# Patient Record
Sex: Female | Born: 1950
Health system: Southern US, Community
[De-identification: ages and names within clinical notes are randomized; demographics above are authoritative.]

## PROBLEM LIST (undated history)

## (undated) DIAGNOSIS — R7989 Other specified abnormal findings of blood chemistry: Secondary | ICD-10-CM

## (undated) DIAGNOSIS — F4310 Post-traumatic stress disorder, unspecified: Secondary | ICD-10-CM

## (undated) DIAGNOSIS — E039 Hypothyroidism, unspecified: Secondary | ICD-10-CM

## (undated) DIAGNOSIS — K219 Gastro-esophageal reflux disease without esophagitis: Secondary | ICD-10-CM

## (undated) DIAGNOSIS — F419 Anxiety disorder, unspecified: Secondary | ICD-10-CM

## (undated) DIAGNOSIS — G9340 Encephalopathy, unspecified: Secondary | ICD-10-CM

## (undated) DIAGNOSIS — I10 Essential (primary) hypertension: Secondary | ICD-10-CM

## (undated) HISTORY — DX: Post-traumatic stress disorder, unspecified: F43.10

## (undated) HISTORY — DX: Hypothyroidism, unspecified: E03.9

## (undated) HISTORY — DX: Anxiety disorder, unspecified: F41.9

## (undated) HISTORY — DX: Gastro-esophageal reflux disease without esophagitis: K21.9

## (undated) HISTORY — DX: Other specified abnormal findings of blood chemistry: R79.89

## (undated) HISTORY — DX: Essential (primary) hypertension: I10

---

## 2011-08-04 ENCOUNTER — Ambulatory Visit (HOSPITAL_COMMUNITY): Payer: Self-pay | Admitting: Licensed Clinical Social Worker

## 2011-09-06 ENCOUNTER — Ambulatory Visit (INDEPENDENT_AMBULATORY_CARE_PROVIDER_SITE_OTHER): Payer: Medicare Other | Admitting: Licensed Clinical Social Worker

## 2011-09-06 DIAGNOSIS — F411 Generalized anxiety disorder: Secondary | ICD-10-CM

## 2011-09-21 ENCOUNTER — Encounter (HOSPITAL_COMMUNITY): Payer: Medicare Other | Admitting: Licensed Clinical Social Worker

## 2011-11-10 ENCOUNTER — Ambulatory Visit (INDEPENDENT_AMBULATORY_CARE_PROVIDER_SITE_OTHER): Payer: Medicare Other | Admitting: Licensed Clinical Social Worker

## 2011-11-10 ENCOUNTER — Encounter (HOSPITAL_COMMUNITY): Payer: Self-pay | Admitting: Licensed Clinical Social Worker

## 2011-11-10 DIAGNOSIS — F410 Panic disorder [episodic paroxysmal anxiety] without agoraphobia: Secondary | ICD-10-CM | POA: Insufficient documentation

## 2011-11-10 DIAGNOSIS — F4310 Post-traumatic stress disorder, unspecified: Secondary | ICD-10-CM
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
## 2011-11-10 NOTE — Patient Instructions (Signed)
Keep negative comments about son-in-law to herself Continue to try to identify triggers for anxiety attacks

## 2011-12-07 ENCOUNTER — Ambulatory Visit (INDEPENDENT_AMBULATORY_CARE_PROVIDER_SITE_OTHER): Payer: Medicare Other | Admitting: Licensed Clinical Social Worker

## 2011-12-07 ENCOUNTER — Encounter (HOSPITAL_COMMUNITY): Payer: Self-pay | Admitting: Licensed Clinical Social Worker

## 2011-12-07 DIAGNOSIS — F410 Panic disorder [episodic paroxysmal anxiety] without agoraphobia: Secondary | ICD-10-CM
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
## 2011-12-07 NOTE — Progress Notes (Deleted)
Psychiatric Assessment Adult  Patient Identification:  Claire [name] Date of Evaluation:  12/07/2011 Chief Complaint: *** History of Chief Complaint:  No chief complaint on file.   HPI Review of Systems Physical Exam  Depressive Symptoms: {DEPRESSION SYMPTOMS:20000}  (Hypo) Manic Symptoms:   Elevated Mood:  {BHH YES OR NO:22294} Irritable Mood:  {BHH YES OR NO:22294} Grandiosity:  {BHH YES OR NO:22294} Distractibility:  {BHH YES OR NO:22294} Labiality of Mood:  {BHH YES OR NO:22294} Delusions:  {BHH YES OR NO:22294} Hallucinations:  {BHH YES OR NO:22294} Impulsivity:  {BHH YES OR NO:22294} Sexually Inappropriate Behavior:  {BHH YES OR NO:22294} Financial Extravagance:  {BHH YES OR NO:22294} Flight of Ideas:  {BHH YES OR NO:22294}  Anxiety Symptoms: Excessive Worry:  {BHH YES OR NO:22294} Panic Symptoms:  {BHH YES OR NO:22294} Agoraphobia:  {BHH YES OR NO:22294} Obsessive Compulsive: {BHH YES OR NO:22294}  Symptoms: {Obsessive Compulsive Symptoms:22671} Specific Phobias:  {BHH YES OR NO:22294} Social Anxiety:  {BHH YES OR NO:22294}  Psychotic Symptoms:  Hallucinations: {BHH YES OR NO:22294} {Hallucinations:22672} Delusions:  {BHH YES OR NO:22294} Paranoia:  {BHH YES OR NO:22294}   Ideas of Reference:  {BHH YES OR NO:22294}  PTSD Symptoms: Ever had a traumatic exposure:  {BHH YES OR NO:22294} Had a traumatic exposure in the last month:  {BHH YES OR NO:22294} Re-experiencing: {BHH YES OR NO:22294} {Re-experiencing:22673} Hypervigilance:  {BHH YES OR NO:22294} Hyperarousal: {BHH YES OR NO:22294} {Hyperarousal:22674} Avoidance: {BHH YES OR NO:22294} {Avoidance:22675}  Traumatic Brain Injury: {BHH YES OR NO:22294} {Traumatic Brain Injury:22676}  Past Psychiatric History: Diagnosis: ***  Hospitalizations: ***  Outpatient Care: ***  Substance Abuse Care: ***  Self-Mutilation: ***  Suicidal Attempts: ***  Violent Behaviors: ***   Past Medical History:   Past  Medical History  Diagnosis Date  . High blood pressure   . PTSD (post-traumatic stress disorder)   . Acid reflux    History of Loss of Consciousness:  {BHH YES OR NO:22294} Seizure History:  {BHH YES OR NO:22294} Cardiac History:  {BHH YES OR NO:22294} Allergies:   Allergies  Allergen Reactions  . Macrodantin (Nitrofurantoin Macrocrystal) Rash   Current Medications:  Current Outpatient Prescriptions  Medication Sig Dispense Refill  . atenolol (TENORMIN) 50 MG tablet Take 50 mg by mouth daily.        . clonazePAM (KLONOPIN) 0.5 MG tablet Take 0.5 mg by mouth 4 (four) times daily - after meals and at bedtime.          Previous Psychotropic Medications:  Medication Dose   ***  ***                     Substance Abuse History in the last 12 months: Substance Age of 1st Use Last Use Amount Specific Type  Nicotine  ***  ***  ***  ***  Alcohol  ***  ***  ***  ***  Cannabis  ***  ***  ***  ***  Opiates  ***  ***  ***  ***  Cocaine  ***  ***  ***  ***  Methamphetamines  ***  ***  ***  ***  LSD  ***  ***  ***  ***  Ecstasy  ***   ***  ***  ***  Benzodiazepines  ***  ***  ***  ***  Caffeine  ***  ***  ***  ***  Inhalants  ***  ***  ***  ***  Others:  Medical Consequences of Substance Abuse: ***  Legal Consequences of Substance Abuse: ***  Family Consequences of Substance Abuse: ***  Blackouts:  {BHH YES OR NO:22294} DT's:  {BHH YES OR NO:22294} Withdrawal Symptoms:  {BHH YES OR NO:22294} {Withdrawal Symptoms:22677}  Social History: Current Place of Residence: *** Place of Birth: *** Family Members: *** Marital Status:  {Marital Status:22678} Children: ***  Sons: ***  Daughters: *** Relationships: *** Education:  {Education:22679} Educational Problems/Performance: *** Religious Beliefs/Practices: *** History of Abuse: {Desc; abuse:16542} Occupational Experiences; Military History:  {Military History:22680} Legal History:  *** Hobbies/Interests: ***  Family History:   Family History  Problem Relation Age of Onset  . Anxiety disorder Mother   . OCD Daughter   . Alcohol abuse Father     Mental Status Examination/Evaluation: Objective:  Appearance: {Appearance:22683}  Eye Contact::  {BHH EYE CONTACT:22684}  Speech:  {Speech:22685}  Volume:  {Volume (PAA):22686}  Mood:  ***  Affect:  {Affect (PAA):22687}  Thought Process:  {Thought Process (PAA):22688}  Orientation:  {BHH ORIENTATION (PAA):22689}  Thought Content:  {Thought Content:22690}  Suicidal Thoughts:  {ST/HT (PAA):22692}  Homicidal Thoughts:  {ST/HT (PAA):22692}  Judgement:  {Judgement (PAA):22694}  Insight:  {Insight (PAA):22695}  Psychomotor Activity:  {Psychomotor (PAA):22696}  Akathisia:  {BHH YES OR NO:22294}  Handed:  {Handed:22697}  AIMS (if indicated):  ***  Assets:  {Assets (PAA):22698}    Laboratory/X-Ray Psychological Evaluation(s)   ***  ***   Assessment:  {axis diagnosis:3049000}  AXIS I {psych axis 1:31909}  AXIS II {psych axis 2:31910}  AXIS III Past Medical History  Diagnosis Date  . High blood pressure   . PTSD (post-traumatic stress disorder)   . Acid reflux      AXIS IV {psych axis iv:31915}  AXIS V {psych axis v score:31919}   Treatment Plan/Recommendations:  Plan of Care: ***  Laboratory:  {Laboratory:22682}  Psychotherapy: ***  Medications: ***  Routine PRN Medications:  {BHH YES OR NO:22294}  Consultations: ***  Safety Concerns:  ***  Other:      Arna Luis,JUDITH A, LCSW 1/2/20135:27 PM

## 2011-12-08 ENCOUNTER — Encounter (HOSPITAL_COMMUNITY): Payer: Self-pay | Admitting: Licensed Clinical Social Worker

## 2011-12-08 NOTE — Progress Notes (Signed)
   THERAPIST PROGRESS NOTE  Session Time: 2:05 - 3:00  Participation Level: Active  Behavioral Response: Fairly GroomedAlertDepressed  Type of Therapy: Individual Therapy  Treatment Goals addressed: Anxiety and Communication: in relation to son-in-law  Interventions: Solution Focused, Supportive, Family Systems and Reframing  Summary: Claire [name] is a 61 y.o. female who presents with a depressed mood.  Mainly  Concerned about her living situation - she is with her daughter and [name] does not like her son-in-law - the feeling seems to be mutual.  She takes care of her grandchild to help her daughter while they are helping her by her staying there.  She cannot be alone because of her panic attacks. This has been a problem since she left her husband.  She changed therapists because going to [facility] was too difficult on transportation.  .   Suicidal/Homicidal: Nowithout intent/plan  Plan: Return again in 3 weeks.  Diagnosis: Axis I: Panic Disorder    Axis II: Deferred    Teddrick Mallari,JUDITH A, LCSW 12/08/2011

## 2012-01-10 ENCOUNTER — Ambulatory Visit (HOSPITAL_COMMUNITY): Payer: Self-pay | Admitting: Licensed Clinical Social Worker

## 2012-01-24 ENCOUNTER — Encounter (HOSPITAL_COMMUNITY): Payer: Self-pay | Admitting: Licensed Clinical Social Worker

## 2012-01-24 ENCOUNTER — Ambulatory Visit (INDEPENDENT_AMBULATORY_CARE_PROVIDER_SITE_OTHER): Payer: Medicare Other | Admitting: Licensed Clinical Social Worker

## 2012-01-24 DIAGNOSIS — F4310 Post-traumatic stress disorder, unspecified: Secondary | ICD-10-CM

## 2012-01-24 DIAGNOSIS — F410 Panic disorder [episodic paroxysmal anxiety] without agoraphobia: Secondary | ICD-10-CM
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
## 2012-02-07 ENCOUNTER — Ambulatory Visit (HOSPITAL_COMMUNITY): Payer: Self-pay | Admitting: Licensed Clinical Social Worker

## 2012-02-16 ENCOUNTER — Ambulatory Visit (INDEPENDENT_AMBULATORY_CARE_PROVIDER_SITE_OTHER): Payer: Medicare Other | Admitting: Licensed Clinical Social Worker

## 2012-02-16 DIAGNOSIS — F411 Generalized anxiety disorder: Secondary | ICD-10-CM

## 2012-02-16 DIAGNOSIS — F419 Anxiety disorder, unspecified: Secondary | ICD-10-CM

## 2012-02-16 NOTE — Progress Notes (Signed)
   THERAPIST PROGRESS NOTE  Session Time: 3:10 - 4:05  Participation Level: Active  Behavioral Response: CasualAlertEuthymic  Type of Therapy: Individual Therapy  Treatment Goals addressed: Anxiety  Interventions: Motivational Interviewing, Solution Focused and Supportive  Summary: Claire [name] is a 61 y.o. female who presents with problems with anxiety. 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  She really liked that job. She discussed never liking to visit [place] and [place] country because she was afraid of the dark. She was made to go.  They would not put on a night light for her.  [place] a city and was used to lights.  No one would ever let her have a light.  Her father was alcoholic and he liked to date very young girls  . She has had two sister die - Aggie Cosier at age 27 and Angie at age 36 - she had an infection from a hip replacement.  Angie was her favorite sister.  She rarely has seen her mother - she comes up [place] with she needs something.  Suicidal/Homicidal: Nowithout intent/plan  Plan: Return again in 2 weeks.  Diagnosis: Axis I: Anxiety Disorder NOS    Axis II: Deferred    Beena Catano,JUDITH A, LCSW 02/16/2012

## 2012-03-08 ENCOUNTER — Ambulatory Visit (HOSPITAL_COMMUNITY): Payer: Self-pay | Admitting: Licensed Clinical Social Worker

## 2012-04-10 ENCOUNTER — Ambulatory Visit (INDEPENDENT_AMBULATORY_CARE_PROVIDER_SITE_OTHER): Payer: Medicare Other | Admitting: Licensed Clinical Social Worker

## 2012-04-10 DIAGNOSIS — F419 Anxiety disorder, unspecified: Secondary | ICD-10-CM

## 2012-04-10 DIAGNOSIS — F411 Generalized anxiety disorder: Secondary | ICD-10-CM
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
## 2012-06-22 ENCOUNTER — Ambulatory Visit (INDEPENDENT_AMBULATORY_CARE_PROVIDER_SITE_OTHER): Payer: Medicare Other | Admitting: Licensed Clinical Social Worker

## 2012-06-22 DIAGNOSIS — F4310 Post-traumatic stress disorder, unspecified: Secondary | ICD-10-CM

## 2012-06-22 DIAGNOSIS — F410 Panic disorder [episodic paroxysmal anxiety] without agoraphobia: Secondary | ICD-10-CM

## 2012-06-24 NOTE — Progress Notes (Signed)
   THERAPIST PROGRESS NOTE  Session Time: 3:00 - 3:50  Participation Level: Active  Behavioral Response: CasualAlertEuthymic  Type of Therapy: Individual Therapy  Treatment Goals addressed: Anger and Anxiety  Interventions: Motivational Interviewing, Solution Focused and Supportive  Summary: SELINDA KORZENIEWSKI is a 61 y.o. female who presents with problems with anxiety.  [name] reports that the divorce papers were served 5 or 6 weeks ago and there has been no response  She is frustrated because she gets nothing from him.  The house was an inheritance from his parents.  They have a tax bill for 9000 dollars which is really his debt but it was done under both of their names in 2010.  She is afraid they will end up coming after her - they do not have anything in writing which says that he is responsible for the debt.  She feels for all that she put up with him that he owes her something. She is having a hard time moving on.   Suicidal/Homicidal: Nowithout intent/plan  Plan: Return again in 2 weeks.  Diagnosis: Axis I: Anxiety Disorder NOS    Axis II: Deferred    Cambell Rickenbach,JUDITH A, LCSW 06/24/2012

## 2012-07-10 ENCOUNTER — Ambulatory Visit (HOSPITAL_COMMUNITY): Payer: Self-pay | Admitting: Licensed Clinical Social Worker

## 2012-07-13 ENCOUNTER — Ambulatory Visit (HOSPITAL_COMMUNITY): Payer: Self-pay | Admitting: Licensed Clinical Social Worker

## 2012-07-24 DIAGNOSIS — I10 Essential (primary) hypertension: Secondary | ICD-10-CM | POA: Insufficient documentation

## 2012-07-26 ENCOUNTER — Ambulatory Visit (HOSPITAL_COMMUNITY): Payer: Self-pay | Admitting: Licensed Clinical Social Worker

## 2012-07-31 ENCOUNTER — Ambulatory Visit (INDEPENDENT_AMBULATORY_CARE_PROVIDER_SITE_OTHER): Payer: Medicare Other | Admitting: Licensed Clinical Social Worker

## 2012-07-31 DIAGNOSIS — F410 Panic disorder [episodic paroxysmal anxiety] without agoraphobia: Secondary | ICD-10-CM

## 2012-07-31 NOTE — Progress Notes (Signed)
   THERAPIST PROGRESS NOTE  Session Time: 4:05 - 4:50  Participation Level: Active  Behavioral Response: CasualAlertEuthymic  Type of Therapy: Individual Therapy  Treatment Goals addressed: Anxiety  Interventions: Motivational Interviewing and Supportive  Summary: Claire [name] is a 61 y.o. female who presents with anxiety.  Claire [name] reported that she had to take cortisone because of inflammation in her shoulder and she was anxious about it.  It is a med pack and she has to take 6 for two days and then it goes down gradually.She had never taken that many pills before and was worried about having a bad reaction.  She has no evidence that she is allergic.  Did let her know that some people have problems with being a bit emotional.  She does not like taking medication anyway.  She would never be abusing drugs.  She is concerned about the fact that her husband has not signed divorce papers.  She would like him to settle with her financially but she fears that he won't.  She talked more about living with his hoarding.and how difficult that was. She also discussed her difficulty with her son-in -law. Claire [name] seems to have a tendency to hold negative feelings.  Suicidal/Homicidal: Nowithout intent/plan  Plan: Return again in 4 weeks.  Diagnosis: Axis I: Anxiety Disorder NOS    Axis II: Deferred    Zared Knoth,JUDITH A, LCSW 07/31/2012

## 2012-08-31 ENCOUNTER — Ambulatory Visit (HOSPITAL_COMMUNITY): Payer: Self-pay | Admitting: Licensed Clinical Social Worker

## 2012-09-12 ENCOUNTER — Ambulatory Visit (HOSPITAL_COMMUNITY): Payer: Self-pay | Admitting: Licensed Clinical Social Worker

## 2012-09-18 ENCOUNTER — Ambulatory Visit (INDEPENDENT_AMBULATORY_CARE_PROVIDER_SITE_OTHER): Payer: Medicare Other | Admitting: Licensed Clinical Social Worker

## 2012-09-18 DIAGNOSIS — F410 Panic disorder [episodic paroxysmal anxiety] without agoraphobia: Secondary | ICD-10-CM

## 2012-09-18 NOTE — Progress Notes (Signed)
   THERAPIST PROGRESS NOTE  Session Time: 2:10 - 3:05  Participation Level: Active  Behavioral Response: CasualAlertEuthymic  Type of Therapy: Individual Therapy  Treatment Goals addressed: Anxiety  Interventions: Motivational Interviewing, Solution Focused and Supportive  Summary: Claire [name] is a 61 y.o. female who presents with anxiety.Claire [name] reports that she is staying with her mother for awhile.  She and her daughter needed a break from each other. She talked a lot about how her son and daughter are so different.  Her daughter has always been a challenge and in conflict with her a lot.  Her son does not like conflict and is good to her.  Her daughter feels that she favors her brother over her.  Where Claire [name] feels it is just that they have different personalities and her son is easier to get along with. She talked of how she did not like either spouse of her children - even saying that she hated them.  As she gets into some detail about her relationship with her children, it becomes clearer that there are no boundaries in those relationships.  She is also more focussed on their lives than her own. Her ex husband has not signed the papers for the divorce and she would like to get what she is to take out of the house on paper so that she can get her things when she is ready.  Right now she does not have anyplace to put her things.  She does not want to pay for the divorce so she is waiting for him to complete what he has to complete.   Discussed how she needed to focus on what she wanted for her life and she did realized she did not know what she wanted.  She felt it ws selfish to think about that.  Discussed how it was healthy to be selfish in that way.  She needed to look at their things that she ws unhappy about and what belonged to her and what belonged to her children. She may not like her in-laws but they were not her marital reallocations and her children had to learn and be in their own path.   Seh needed to focus on her path.  She is to write down areas that were not for her to be concerned about and what areas she need to develop in herself.   Asked her what she seemed to get out of coming once per month - for perhaps she could just call when she felt the need to come in.  She felt that having this time was helpful for her in that she could talk about things that were bothering her and she knew it was confidential and it helped her get perspective on her problems.  Concerned that she was not getting enough follow up to make some changes.  She did agree to do some homework in between sessions so that she might get more out of the time.  Her homework was to identify areas that she was worried and concerned about that were really not her problem - define more clearly what she wanted in her life.  Suicidal/Homicidal: Nowithout intent/plan  Plan: Return again in 4 weeks.  Diagnosis: Axis I: Panic Disorder    Axis II: Deferred    Claire [name],JUDITH A, LCSW 09/18/2012

## 2012-10-17 ENCOUNTER — Ambulatory Visit (HOSPITAL_COMMUNITY): Payer: Self-pay | Admitting: Licensed Clinical Social Worker

## 2012-10-23 ENCOUNTER — Telehealth (HOSPITAL_COMMUNITY): Payer: Self-pay

## 2012-10-24 ENCOUNTER — Telehealth (HOSPITAL_COMMUNITY): Payer: Self-pay | Admitting: Licensed Clinical Social Worker

## 2012-10-24 ENCOUNTER — Encounter (HOSPITAL_COMMUNITY): Payer: Self-pay | Admitting: Licensed Clinical Social Worker

## 2012-11-14 ENCOUNTER — Ambulatory Visit (INDEPENDENT_AMBULATORY_CARE_PROVIDER_SITE_OTHER): Payer: Medicare Other | Admitting: Licensed Clinical Social Worker

## 2012-11-14 DIAGNOSIS — F410 Panic disorder [episodic paroxysmal anxiety] without agoraphobia: Secondary | ICD-10-CM
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
## 2012-12-04 ENCOUNTER — Encounter (HOSPITAL_COMMUNITY): Payer: Self-pay | Admitting: Licensed Clinical Social Worker

## 2012-12-27 ENCOUNTER — Ambulatory Visit (HOSPITAL_COMMUNITY): Payer: Self-pay | Admitting: Licensed Clinical Social Worker

## 2013-01-02 ENCOUNTER — Ambulatory Visit (HOSPITAL_COMMUNITY): Payer: Self-pay | Admitting: Licensed Clinical Social Worker

## 2013-01-03 ENCOUNTER — Ambulatory Visit (HOSPITAL_COMMUNITY): Payer: Self-pay | Admitting: Licensed Clinical Social Worker

## 2013-01-24 ENCOUNTER — Ambulatory Visit (HOSPITAL_COMMUNITY): Payer: Self-pay | Admitting: Licensed Clinical Social Worker

## 2013-01-29 ENCOUNTER — Ambulatory Visit (INDEPENDENT_AMBULATORY_CARE_PROVIDER_SITE_OTHER): Payer: Medicare Other | Admitting: Licensed Clinical Social Worker

## 2013-01-29 DIAGNOSIS — F410 Panic disorder [episodic paroxysmal anxiety] without agoraphobia: Secondary | ICD-10-CM

## 2013-02-03 NOTE — Progress Notes (Addendum)
   THERAPIST PROGRESS NOTE  Session Time: 1:00 - 2:00  Participation Level: Active  Behavioral Response: CasualAlertEuthymic  Type of Therapy: Individual Therapy  Treatment Goals addressed: Anxiety  Interventions: Motivational Interviewing and Supportive  Summary: Claire [name] is a 62 y.o. female who presents with concerns about  her son.  [name] has never liked [name]'s wife [name] or her mother Ginger - they both have lived with [name] since the beginning.  She has always been controlling and she has not felt she treated [name] very well.  She is concerned about how [name] seems to need someone telling him what to do.  He has not been in charge of himself in so long that he does not trust his own decision making.  She and [name] tend to get along well - he is easy going.  She feels angry that he was used for his money. [name] is upset with [name] in that she has quickly gotten stressed out having [name] living at the house. In fact she refers to her daughter as betraying her.  She would like [name] and her mother to move our together.  They are looking for an apartment.  It will be fine for them to live together.  [name] is good with her grandson - [name]'s son.  [name] does not handle money well and she did co - sign two loans for her - and has some regrets about that.  She states that she loves both of her children but she and her daughter are too much alike and push each others buttons whereby [name] lets things roll of of his back.  [name] rased his step son [name] who is now an adult.  He helped [name] get his clothes out and wondered why he had not left his mother a long time ago.  His mother in law is the control freak  He did not have transportation for a long time finally got him a truck and then controled its use.  He would be out of medication and food and he could not do anything about it.  His social security check went into his account and it was gone the next day.  [name] has changed his bank  account and will have check sent to him until all is settled down.     Suicidal/Homicidal: No  Therapist Response:  [name] could not help suggesting that I be more directive with [name].  My role will be to help him be more in touch with himself and be able to make own decisions  Plan: Return again in 4 weeks.  Diagnosis: Axis I: Anxiety Disorder NOS    Axis II: Deferred    BELL,JUDITH A, LCSW 02/03/2013

## 2013-02-19 ENCOUNTER — Ambulatory Visit (INDEPENDENT_AMBULATORY_CARE_PROVIDER_SITE_OTHER): Payer: Medicare Other | Admitting: Licensed Clinical Social Worker

## 2013-02-19 DIAGNOSIS — F410 Panic disorder [episodic paroxysmal anxiety] without agoraphobia: Secondary | ICD-10-CM

## 2013-02-20 NOTE — Progress Notes (Signed)
   THERAPIST PROGRESS NOTE  Session Time: 4:00 - 5:00  Participation Level: Active  Behavioral Response: CasualAlertEuthymic  Type of Therapy: Individual Therapy  Treatment Goals addressed: Anxiety  Interventions: Motivational Interviewing and Supportive  Summary: Claire [name] is a 62 y.o. female who presents with concerns about son and daughter.  [name] reports that she and her son [name] are moving out of daughter's and into a place of their own.  Daughter suggested that.but [name] feels that she may have changed her mind for she has been talking about them paying more - each giving 300 dollars and paying for food.  So she feels daughter will be angry when they leave.  She is unhappy about the way her daughter is treating her.  Her husband does the laundry because in a cold basement and daughter won't carry clothes up and down.  He is home at night so washes and dries clothes at night and dumps them on couch at about 10 or 11 o'clock.  Her daughter expects [name] to fold them then - she is not going to do that for that is bed time. [name] reports that she keeps her room clean, does dishes, cleans up kitchen, picks up the rest of the house and picks up her son and takes time with him for her. So she feels she contributes - she also pays some rent.  Her daughter is sharp tongued and reacts to her easily. [name] did talk about how she does not have a filter - when she thinks something she says it.  She does come across as very judgmental - [name] did not react negatively to being told that.  She is critical in her opinions and she says she is the first to say that she is opinionated.  [name] is showing signs of depression by sleeping too much and perhaps eating too much. He has not been coming in for regular appointments.  She wants to help him lose weight  He apparently gained 200 pounds while married and he quit doing the things he used to do.  He would be at the [employer] and go hunting and fishing.  He has lost  contact and does nothing.  [name] is angry with daughter because she feels she is giving [name] the same message he got in his marriage - "you are only good for your money and you eat too much"  [name] is going to have to get her furniture out of the house she used to live in with her husband.  She will have to take the [facility] with her.  She does want to work on developing a filter..  Suicidal/Homicidal: No  Therapist Response:  Along with a filer - she needs to look more positively at people and issues.  Plan: Return again in 2 weeks.  Diagnosis: Axis I: Anxiety Disorder NOS    Axis II: Deferred    Annice Jolly,JUDITH A, LCSW 02/20/2013

## 2013-03-20 ENCOUNTER — Telehealth (HOSPITAL_COMMUNITY): Payer: Self-pay

## 2013-04-03 NOTE — Telephone Encounter (Signed)
acknowledged

## 2013-04-10 ENCOUNTER — Ambulatory Visit (HOSPITAL_COMMUNITY): Payer: Self-pay | Admitting: Licensed Clinical Social Worker

## 2013-04-23 ENCOUNTER — Telehealth (HOSPITAL_COMMUNITY): Payer: Self-pay

## 2013-04-23 NOTE — Telephone Encounter (Signed)
WOULD LIKE TO ASK YOU A QUESTION WOULD NOT ELABORATE

## 2013-05-02 ENCOUNTER — Ambulatory Visit (INDEPENDENT_AMBULATORY_CARE_PROVIDER_SITE_OTHER): Payer: Medicare Other | Admitting: Licensed Clinical Social Worker

## 2013-05-02 DIAGNOSIS — F410 Panic disorder [episodic paroxysmal anxiety] without agoraphobia: Secondary | ICD-10-CM

## 2013-05-22 ENCOUNTER — Ambulatory Visit (HOSPITAL_COMMUNITY): Payer: Self-pay | Admitting: Licensed Clinical Social Worker

## 2013-05-31 NOTE — Progress Notes (Signed)
   THERAPIST PROGRESS NOTE  Session Time: 3:00 - 3:50  Participation Level: Active  Behavioral Response: CasualAlertEuthymic  Type of Therapy: Individual Therapy  Treatment Goals addressed: Anger and Anxiety  Interventions: Motivational Interviewing and Supportive  Summary: Claire [name] is [name] 62 y.o. female who presents with concerns about family.  She and her son have moved into an apartment and he is not being very helpful.  He is hardly ever there and he does minimal to be helpful.  He has taken an attitude with her about not running his life.  He does not want therapy, just complains about his weight but won't do anything about it.  She admits that she had overindulged him when he was [name] and it is definitely showing itself now.  He is pretty lazy and passive aggressive.  Discussed how she needs to set better boundaries for herself.  Her focus needs to come off of him and on to herself..   Suicidal/Homicidal: No  Therapist Response: Needs to do [name] lot of work on limits and boundaries.  Plan: Return again in 2 weeks.  Diagnosis: Axis I: Generalized Anxiety Disorder    Axis II: Deferred    Claire [name],Claire A, LCSW 05/31/2013

## 2014-11-24 DIAGNOSIS — E039 Hypothyroidism, unspecified: Secondary | ICD-10-CM | POA: Insufficient documentation

## 2014-11-26 ENCOUNTER — Emergency Department
Admission: EM | Admit: 2014-11-26 | Discharge: 2014-11-26 | Disposition: A | Discharge status: Home / Self Care | Payer: Medicare Other | Source: Home / Self Care | Attending: Emergency Medicine | Admitting: Emergency Medicine

## 2014-11-26 ENCOUNTER — Encounter: Payer: Self-pay | Admitting: Emergency Medicine

## 2014-11-26 DIAGNOSIS — R5383 Other fatigue: Secondary | ICD-10-CM

## 2014-11-26 DIAGNOSIS — E876 Hypokalemia: Secondary | ICD-10-CM

## 2014-11-26 NOTE — ED Notes (Signed)
Fatigue since Sunday, was treated in ED for diarrhea due to reaction from z-pak, low potassium (2.9) brought up to 3.8, in hospital, read on internet that high potassium can cause your heart to stop.

## 2014-11-27 ENCOUNTER — Telehealth: Payer: Self-pay | Admitting: Emergency Medicine

## 2014-11-27 LAB — COMPREHENSIVE METABOLIC PANEL
ALT: <8 U/L (ref 0–35)
AST: 15 U/L (ref 0–37)
Albumin: 3.9 g/dL (ref 3.5–5.2)
Alkaline Phosphatase: 73 U/L (ref 39–117)
BUN: 6 mg/dL (ref 6–23)
CO2: 35 mEq/L — ABNORMAL HIGH (ref 19–32)
Calcium: 8.6 mg/dL (ref 8.4–10.5)
Chloride: 96 mEq/L (ref 96–112)
Creat: 0.7 mg/dL (ref 0.50–1.10)
Glucose, Bld: 88 mg/dL (ref 70–99)
Potassium: 4.9 mEq/L (ref 3.5–5.3)
Sodium: 138 mEq/L (ref 135–145)
Total Bilirubin: 0.5 mg/dL (ref 0.2–1.2)
Total Protein: 7.4 g/dL (ref 6.0–8.3)

## 2014-11-28 NOTE — ED Provider Notes (Signed)
CSN: [id number]     Arrival date & time 11/26/14  1434 History   First MD Initiated Contact with Patient 11/26/14 1440     Chief Complaint  Patient presents with  . Fatigue   PCP is Dr Hewitt ShortsMatt Zimmerman, [place], [place]  The history is provided by the patient (and her adult daughter, who gives much of the history).  Three days ago, had acute acute diarrhea, fatigue ,generalized weakness without any focal weakness. [facility] , was found to be dehydrated, potassium 2.9, and she was treated with IV fluids including potassium replacement. She improved and was discharged home. Daughter states CBC was normal. Patient was recently started on levothyroxine 50 micrograms daily last week by her PCP for hypothyroid. She has a history of asthma/COPD, and continues to smoke a half a pack a day. Denies any new respiratory symptoms. Has stable dyspnea on exertion but no chest pain. Denies cough. Denies nausea, vomiting, fever, chills, diarrhea. Denies any focal neurologic symptoms. No syncope.  Patient and mother request we draw complete metabolic panel today.  Past Medical History  Diagnosis Date  . High blood pressure   . PTSD (post-traumatic stress disorder)   . Acid reflux   . Anxiety    History reviewed. No pertinent past surgical history. Family History  Problem Relation Age of Onset  . Anxiety disorder Mother   . OCD Daughter   . Alcohol abuse Father    History  Substance Use Topics  . Smoking status: Current Every Day Smoker -- 0.50 packs/day for 50 years    Types: Cigarettes  . Smokeless tobacco: Never Used  . Alcohol Use: No   OB History    No data available     Review of Systems  All other systems reviewed and are negative.   Allergies  Macrodantin and Zithromax  Home Medications   Prior to Admission medications   Medication Sig Start Date End Date Taking? Authorizing Provider  albuterol (PROVENTIL) (2.5 MG/3ML) 0.083% nebulizer  solution Take 2.5 mg by nebulization every 6 (six) hours as needed for wheezing or shortness of breath.   Yes Historical Provider, MD  Calcium Carbonate-Vitamin D (CALCIUM-VITAMIN D) 500-200 MG-UNIT per tablet Take 1 tablet by mouth daily.   Yes Historical Provider, MD  levothyroxine (SYNTHROID, LEVOTHROID) 50 MCG tablet Take 50 mcg by mouth daily before breakfast.   Yes Historical Provider, MD  mometasone-formoterol (DULERA) 100-5 MCG/ACT AERO Inhale 2 puffs into the lungs 2 (two) times daily.   Yes Historical Provider, MD  omeprazole (PRILOSEC) 20 MG capsule Take 20 mg by mouth daily.   Yes Historical Provider, MD  potassium chloride (K-DUR,KLOR-CON) 10 MEQ tablet Take 10 mEq by mouth 2 (two) times daily.   Yes Historical Provider, MD  atenolol (TENORMIN) 50 MG tablet Take 50 mg by mouth daily.      Historical Provider, MD  clonazePAM (KLONOPIN) 0.5 MG tablet Take 0.5 mg by mouth 4 (four) times daily - after meals and at bedtime.      Historical Provider, MD   BP 149/89 mmHg  Pulse 82  Temp(Src) 97.5 F (36.4 C) (Oral)  Ht 5\' 4"  (1.626 m)  Wt 139 lb (63.05 kg)  BMI 23.85 kg/m2  SpO2 93% Physical Exam  Constitutional: She is oriented to person, place, and time. She appears well-developed and well-nourished. No distress.  HENT:  Head: Normocephalic and atraumatic.  Eyes: Conjunctivae and EOM are normal. Pupils are equal, round, and  reactive to light. No scleral icterus.  Neck: Normal range of motion.  Cardiovascular: Normal rate.   Pulmonary/Chest: Effort normal.  Oxygen saturation repeated 95% room air  Abdominal: She exhibits no distension.  Musculoskeletal: Normal range of motion.  Neurological: She is alert and oriented to person, place, and time.  Skin: Skin is warm. No rash noted. She is not diaphoretic.  Psychiatric: She has a normal mood and affect.  Nursing note and vitals reviewed.   ED Course  Procedures (including critical care time) Labs Review   MDM   1. Other  fatigue   2. Hypokalemia   hypothyroid, new diagnosis by PCP. Chronic COPD, stable History of acute hypokalemia from diarrhea, but the diarrhea is resolved now.  Over 30 minutes spent, greater than 50% of the time spent for counseling and coordination of care. Treatment options discussed, as well as risks, benefits, alternatives. Patient and daughter voiced understanding and agreement with the following plans: Continue meds as prescribed by PCP. Draw CMP today.--- We will call back with results. Follow-up with your primary care doctor in 5-7 days if not improving, or sooner if symptoms become worse. Questions invited and answered.--they had some specific questions about hypothyroid treatment, and I answered as best I could, and I explained the need to take Levothyroxine as prescribed by PCP and follow-up with PCP periodically for monitoring lab work. Other questions invited and answered. Precautions discussed. Red flags discussed.  Patient and daughter voiced understanding and agreement.       Lajean Manesavid Massey, MD 11/28/14 1440

## 2015-03-12 ENCOUNTER — Encounter (HOSPITAL_COMMUNITY): Payer: Self-pay | Admitting: Psychiatry

## 2015-03-12 ENCOUNTER — Ambulatory Visit (INDEPENDENT_AMBULATORY_CARE_PROVIDER_SITE_OTHER): Payer: 59 | Admitting: Psychiatry

## 2015-03-12 VITALS — BP 137/77 | HR 80 | Ht 65.0 in | Wt 140.0 lb

## 2015-03-12 DIAGNOSIS — E039 Hypothyroidism, unspecified: Secondary | ICD-10-CM | POA: Insufficient documentation

## 2015-03-12 DIAGNOSIS — F410 Panic disorder [episodic paroxysmal anxiety] without agoraphobia: Secondary | ICD-10-CM | POA: Diagnosis not present

## 2015-03-12 MED ORDER — CLONAZEPAM 0.5 MG PO TABS: 0.5000 mg | ORAL_TABLET | Freq: Two times a day (BID) | ORAL | Status: DC | PRN
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
## 2015-03-13 ENCOUNTER — Telehealth (HOSPITAL_COMMUNITY): Payer: Self-pay

## 2015-03-13 NOTE — Telephone Encounter (Signed)
Return call to pt. Pt states she only to 2 tablets yesterday and went numb in her face, legs, and arms.  Per Dr. Gilmore LarocheAkhtar, please inform pt to take 2 tablets per night, pt may increase to 2 and 1/2 tablets a day.  Pt states she is still having panic attacks when she takes 2 tablets.  Informed pt, if the panic attacks are getting worse pt should proceed to the emergency room or can call the office to schedule an appt. Pt states and shows understanding.

## 2015-03-13 NOTE — Telephone Encounter (Signed)
PT would like to speak to you in regards to her lower dosage of Klonopin. She is experiencing numbness in her face,arms and legs, when she takes the lower dosage.

## 2015-03-26 ENCOUNTER — Telehealth (HOSPITAL_COMMUNITY): Payer: Self-pay | Admitting: *Deleted

## 2015-03-26 NOTE — Telephone Encounter (Signed)
Patient should be taking 2.5 but wanted to increase back to 3 tablets. She should have enough till 4/26th.

## 2015-03-26 NOTE — Telephone Encounter (Signed)
PT called for a refill for clonazePAM (KLONOPIN) 0.5 MG tablet.  Pt states she has been taking 3 pills a day and only has one week left for medication. Informed pt it is too early for medication refill. Rx was written on 03/12/15. Please cal pt to advise.

## 2015-03-31 ENCOUNTER — Telehealth (HOSPITAL_COMMUNITY): Payer: Self-pay | Admitting: *Deleted

## 2015-03-31 MED ORDER — CLONAZEPAM 0.5 MG PO TABS: 0.5000 mg | ORAL_TABLET | Freq: Two times a day (BID) | ORAL | Status: DC | PRN

## 2015-03-31 NOTE — Telephone Encounter (Signed)
Pt called for a refill for Klonopin 0.5mg . Pt called on 4/8 for advice for medication management.  Per Dr. Gilmore LarocheAkhtar, pt may increase Klonopin 0.5mg  to TID prn.  Per Dr. Gilmore LarocheAkhtar, pt is authorized for a refill for Klonopin 0.5mg  for a 10 day supply, Qty 30. Prescription was phoned into pharmacy on 4/26. Pharmacy will notify pt of prescription status. Pt has a f/u appt on 5/5.

## 2015-04-09 ENCOUNTER — Encounter (HOSPITAL_COMMUNITY): Payer: Self-pay | Admitting: Psychiatry

## 2015-04-09 ENCOUNTER — Ambulatory Visit (INDEPENDENT_AMBULATORY_CARE_PROVIDER_SITE_OTHER): Payer: 59 | Admitting: Psychiatry

## 2015-04-09 VITALS — Ht 65.0 in | Wt 138.0 lb

## 2015-04-09 DIAGNOSIS — F410 Panic disorder [episodic paroxysmal anxiety] without agoraphobia: Secondary | ICD-10-CM | POA: Diagnosis not present

## 2015-04-09 DIAGNOSIS — F4323 Adjustment disorder with mixed anxiety and depressed mood: Secondary | ICD-10-CM

## 2015-04-09 MED ORDER — CLONAZEPAM 0.5 MG PO TABS: 0.5000 mg | ORAL_TABLET | Freq: Three times a day (TID) | ORAL | Status: DC | PRN
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
## 2015-04-13 ENCOUNTER — Encounter: Payer: Self-pay | Admitting: *Deleted

## 2015-04-13 ENCOUNTER — Emergency Department
Admission: EM | Admit: 2015-04-13 | Discharge: 2015-04-13 | Disposition: A | Discharge status: Home / Self Care | Payer: Medicare Other | Source: Home / Self Care | Attending: Family Medicine | Admitting: Family Medicine

## 2015-04-13 DIAGNOSIS — R002 Palpitations: Secondary | ICD-10-CM | POA: Diagnosis not present

## 2015-04-13 NOTE — ED Notes (Signed)
Pt reports h/o IBS and diagnosed by GI with possible malabsorption. In the past when she has felt palpitations or "nervous feeling" she has gone to the ER with her potassium and magnesium levels out of range. She also has h/o panic attacks. The past 2 days she has felt similar sensations as she did when her K and Mg were abnormal.

## 2015-04-14 LAB — COMPLETE METABOLIC PANEL WITH GFR
ALK PHOS: 60 U/L (ref 39–117)
ALT: 9 U/L (ref 0–35)
AST: 17 U/L (ref 0–37)
Albumin: 4.2 g/dL (ref 3.5–5.2)
BUN: 20 mg/dL (ref 6–23)
CO2: 28 mEq/L (ref 19–32)
Calcium: 9.9 mg/dL (ref 8.4–10.5)
Chloride: 88 mEq/L — ABNORMAL LOW (ref 96–112)
Creat: 1.29 mg/dL — ABNORMAL HIGH (ref 0.50–1.10)
GFR, EST NON AFRICAN AMERICAN: 44 mL/min — AB
GFR, Est African American: 51 mL/min — ABNORMAL LOW
GLUCOSE: 97 mg/dL (ref 70–99)
Potassium: 5 mEq/L (ref 3.5–5.3)
Sodium: 127 mEq/L — ABNORMAL LOW (ref 135–145)
Total Bilirubin: 0.5 mg/dL (ref 0.2–1.2)
Total Protein: 8.1 g/dL (ref 6.0–8.3)

## 2015-04-14 LAB — MAGNESIUM: MAGNESIUM: 1.9 mg/dL (ref 1.5–2.5)

## 2015-04-21 NOTE — Discharge Instructions (Signed)

## 2015-04-21 NOTE — ED Provider Notes (Signed)
CSN: [id number]     Arrival date & time 04/13/15  1506 History   First MD Initiated Contact with Patient 04/13/15 1631     Chief Complaint  Patient presents with  . Palpitations  . Labs Only      HPI Comments: Patient presents for lab work to check potassium and magnesium.  She has a history of IBS and possible malabsorption, and is presently being evaluated by GI.  She has a history of anxiety and panic attacks with palpitations.  In the past when she has presented to ER for palpitations, her potassium and magnesium were abnormal.  During the past two days she has not felt quite well and wonders if her electrolytes have been abnormal.  She denies palpitations, light-headedness, shortness of breath, chest pain.  The history is provided by the patient.    Past Medical History  Diagnosis Date  . High blood pressure   . PTSD (post-traumatic stress disorder)   . Acid reflux   . Anxiety   . Hypothyroid    History reviewed. No pertinent past surgical history. Family History  Problem Relation Age of Onset  . Anxiety disorder Mother   . OCD Daughter   . Alcohol abuse Father   . Anxiety disorder Sister    History  Substance Use Topics  . Smoking status: Current Every Day Smoker -- 0.50 packs/day for 50 years    Types: Cigarettes  . Smokeless tobacco: Never Used  . Alcohol Use: No   OB History    No data available     Review of Systems  Constitutional: Positive for fatigue. Negative for fever, chills and diaphoresis.  Eyes: Negative.   Respiratory: Negative.   Cardiovascular: Negative.   Gastrointestinal: Positive for diarrhea. Negative for nausea, vomiting, abdominal pain, constipation, blood in stool, abdominal distention and anal bleeding.  Genitourinary: Negative.   Neurological: Negative for dizziness, light-headedness and headaches.  Psychiatric/Behavioral: Negative for suicidal ideas. The patient is nervous/anxious.     Allergies  Macrodantin and Zithromax  Home  Medications   Prior to Admission medications   Medication Sig Start Date End Date Taking? Authorizing Provider  Budesonide (UCERIS PO) Take by mouth.   Yes Historical Provider, MD  albuterol (PROVENTIL) (2.5 MG/3ML) 0.083% nebulizer solution Take 2.5 mg by nebulization every 6 (six) hours as needed for wheezing or shortness of breath.    Historical Provider, MD  atenolol (TENORMIN) 50 MG tablet Take 50 mg by mouth daily.      Historical Provider, MD  Calcium Carbonate-Vitamin D (CALCIUM-VITAMIN D) 500-200 MG-UNIT per tablet Take 1 tablet by mouth daily.    Historical Provider, MD  clonazePAM (KLONOPIN) 0.5 MG tablet Take 1 tablet (0.5 mg total) by mouth 3 (three) times daily as needed for anxiety. 04/11/15   Thresa RossNadeem Akhtar, MD  levothyroxine (SYNTHROID, LEVOTHROID) 50 MCG tablet Take 50 mcg by mouth daily before breakfast.    Historical Provider, MD  mometasone-formoterol (DULERA) 100-5 MCG/ACT AERO Inhale 2 puffs into the lungs 2 (two) times daily.    Historical Provider, MD  omeprazole (PRILOSEC) 20 MG capsule Take 20 mg by mouth daily.    Historical Provider, MD  potassium chloride (K-DUR,KLOR-CON) 10 MEQ tablet Take 10 mEq by mouth 2 (two) times daily.    Historical Provider, MD   BP 116/72 mmHg  Pulse 79  Resp 16  Wt 131 lb (59.421 kg)  SpO2 97% Physical Exam Nursing notes and Vital Signs reviewed. Appearance:  Patient appears stated age, and in  no acute distress. Psychiatric:  Patient is alert and oriented with good eye contact.  Thoughts are organized.  No psychomotor retardation.  Memory intact.  Not suicidal  Eyes:  Pupils are equal, round, and reactive to light and accomodation.  Extraocular movement is intact.  Conjunctivae are not inflamed  Pharynx:  Normal Neck:  Supple.   No adenopathy.  Carotids have normal uptakes Lungs:  Clear to auscultation.  Breath sounds are equal.  Heart:  Regular rate and rhythm without murmurs, rubs, or gallops.  Abdomen:  Nontender without masses or  hepatosplenomegaly.  Bowel sounds are present.  No CVA or flank tenderness.  Extremities:  No edema.  No calf tenderness Skin:  No rash present.   ED Course  Procedures  none   MDM   1. Palpitations    CMP pending. Followup with PCP for management    Lattie HawStephen A Beese, MD 04/21/15 1356

## 2015-05-08 ENCOUNTER — Encounter (HOSPITAL_COMMUNITY): Payer: Self-pay | Admitting: Psychiatry

## 2015-05-08 ENCOUNTER — Ambulatory Visit (INDEPENDENT_AMBULATORY_CARE_PROVIDER_SITE_OTHER): Payer: 59 | Admitting: Psychiatry

## 2015-05-08 ENCOUNTER — Encounter (INDEPENDENT_AMBULATORY_CARE_PROVIDER_SITE_OTHER): Payer: Self-pay

## 2015-05-08 VITALS — HR 82 | Ht 63.0 in | Wt 138.0 lb

## 2015-05-08 DIAGNOSIS — F410 Panic disorder [episodic paroxysmal anxiety] without agoraphobia: Secondary | ICD-10-CM

## 2015-05-08 DIAGNOSIS — F4323 Adjustment disorder with mixed anxiety and depressed mood: Secondary | ICD-10-CM

## 2015-05-08 MED ORDER — CLONAZEPAM 0.5 MG PO TABS
0.5000 mg | ORAL_TABLET | Freq: Three times a day (TID) | ORAL | Status: DC | PRN
Start: 2015-05-08 — End: 2015-06-08

## 2015-05-08 NOTE — Progress Notes (Signed)
Patient ID: Claire [name], female   DOB: 01-22-1951, 64 y.o.   MRN: [id number]  [facility] Follow up visit  Claire [name] [id number] 64 y.o.  05/08/2015 3:11 PM  Chief Complaint:  Establish care for panic attacks  History of Present Illness:   Patient Presents for follow up and medication management for  panic symptoms. Diagnosed with PTSD, panic symptoms and GAD.  Initial presentation was  "Patient is a 64 years old currently divorced Caucasian female is living with her daughter and son and son-in-law. She has been seeing Dr. Tacy Dura for panic disorder. Referred because he does not want to continue prescribed Klonopin and wanted her to establish care with a psychiatrist. 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 After that she is not able to go back to store she started having nausea dizziness chest pain feeling of choking [place] palpitations. She started developing more panic symptoms and apprehension of having another panic attack. In 2012 she was disabled with panic disorder and is on disability. At times she feels a panic attack him out of the blue she worries about having another panic attack. She is a panic attacks has affected her work and she is not able to perform work since 2012 because of apprehension, panic, feeling of unpredictable excessive worries. She has been evaluated and medically cleared she is currently on hypothyroid medication"  Last visit. Klonopin for her anxiety and generalized anxiety. She is doing reasonable. She is followed with a gastroenterologist and her irritable bowel is better which has helped her worries down.   Anxiety and panic are more controlled when she is taking 3 tablets a  Day.   Aggravating factors: stress and crowds. Medical problems  Modifying factors; her grandkid her daughter spends some time with the family. Severity of  anxiety: 4./10. 10 being extreme anxiety   Medical complexity/ records. Records from her recent admission to [facility] reviewed for Irritable bowel syndrome. Anxiety and stress was exacerbated after that.      Past Psychiatric History/Hospitalization(s) Denies hospitalizations she has been treated for panic and anxiety since 13 years. There is no history of suicide   Hospitalization for psychiatric illness: No History of Electroconvulsive Shock Therapy: No Prior Suicide Attempts: No  Medical History; Past Medical History  Diagnosis Date  . High blood pressure   . PTSD (post-traumatic stress disorder)   . Acid reflux   . Anxiety   . Hypothyroid     Allergies: Allergies  Allergen Reactions  . Macrodantin [Nitrofurantoin Macrocrystal] Rash  . Zithromax [Azithromycin]     Medications: Outpatient Encounter Prescriptions as of 05/08/2015  Medication Sig  . albuterol (PROVENTIL) (2.5 MG/3ML) 0.083% nebulizer solution Take 2.5 mg by nebulization every 6 (six) hours as needed for wheezing or shortness of breath.  [name] atenolol (TENORMIN) 50 MG tablet Take 50 mg by mouth daily.    . Budesonide (UCERIS PO) Take by mouth.  . Calcium Carbonate-Vitamin D (CALCIUM-VITAMIN D) 500-200 MG-UNIT per tablet Take 1 tablet by mouth daily.  . clonazePAM (KLONOPIN) 0.5 MG tablet Take 1 tablet (0.5 mg total) by mouth 3 (three) times daily as needed for anxiety.  [name] levothyroxine (SYNTHROID, LEVOTHROID) 50 MCG tablet Take 50 mcg by mouth daily before breakfast.  . mometasone-formoterol (DULERA) 100-5 MCG/ACT AERO Inhale 2 puffs into the lungs 2 (two) times daily.  [name] omeprazole (PRILOSEC) 20 MG capsule Take 20  mg by mouth daily.  . potassium chloride (K-DUR,KLOR-CON) 10 MEQ tablet Take 10 mEq by mouth 2 (two) times daily.  . [DISCONTINUED] clonazePAM (KLONOPIN) 0.5 MG tablet Take 1 tablet (0.5 mg total) by mouth 3 (three) times daily as needed for anxiety.   No facility-administered encounter medications  on file as of 05/08/2015.    Family History; Family History  Problem Relation Age of Onset  . Anxiety disorder Mother   . OCD Daughter   . Alcohol abuse Father   . Anxiety disorder Sister        Labs:  Recent Results (from the past 2160 hour(s))  COMPLETE METABOLIC PANEL WITH GFR     Status: Abnormal   Collection Time: 04/13/15  3:44 PM  Result Value Ref Range   Sodium 127 (L) 135 - 145 mEq/L   Potassium 5.0 3.5 - 5.3 mEq/L   Chloride 88 (L) 96 - 112 mEq/L   CO2 28 19 - 32 mEq/L   Glucose, Bld 97 70 - 99 mg/dL   BUN 20 6 - 23 mg/dL   Creat 1.29 (H) 0.50 - 1.10 mg/dL   Total Bilirubin 0.5 0.2 - 1.2 mg/dL   Alkaline Phosphatase 60 39 - 117 U/L   AST 17 0 - 37 U/L   ALT 9 0 - 35 U/L   Total Protein 8.1 6.0 - 8.3 g/dL   Albumin 4.2 3.5 - 5.2 g/dL   Calcium 9.9 8.4 - 10.5 mg/dL   GFR, Est African American 51 (L) mL/min   GFR, Est Non African American 44 (L) mL/min    Comment:   The estimated GFR is a calculation valid for adults (>=2 years old) that uses the CKD-EPI algorithm to adjust for age and sex. It is   not to be used for children, pregnant women, hospitalized patients,    patients on dialysis, or with rapidly changing kidney function. According to the NKDEP, eGFR >89 is normal, 60-89 shows mild impairment, 30-59 shows moderate impairment, 15-29 shows severe impairment and <15 is ESRD.     Magnesium     Status: None   Collection Time: 04/13/15  3:44 PM  Result Value Ref Range   Magnesium 1.9 1.5 - 2.5 mg/dL       Musculoskeletal: Strength & Muscle Tone: within normal limits Gait & Station: normal Patient leans: N/A  Mental Status Examination;   Psychiatric Specialty Exam: Physical Exam  Constitutional: She appears well-nourished.  Skin: She is not diaphoretic.    Review of Systems  Cardiovascular: Negative for chest pain.  Skin: Negative for rash.  Neurological: Negative for headaches.  Psychiatric/Behavioral: Negative for hallucinations and  substance abuse.    Pulse 82, height 5' 3"  (1.6 m), weight 138 lb (62.596 kg).Body mass index is 24.45 kg/(m^2).  General Appearance: Casual  Eye Contact::  Fair  Speech:  Slow  Volume:  Decreased  Mood:  Euthymic  Affect:  Restricted  Thought Process:  Coherent  Orientation:  Full (Time, Place, and Person)  Thought Content:  Rumination  Suicidal Thoughts:  No  Homicidal Thoughts:  No  Memory:  Immediate;   Fair Recent;   Fair  Judgement:  Fair  Insight:  Shallow  Psychomotor Activity:  Normal  Concentration:  Fair  Recall:  Fair  Akathisia:  Negative  Handed:  Right  AIMS (if indicated):     Assets:  Social Support Vocational/Educational  Sleep:        Assessment: Axis I: Panic disorder. Possible PTSD. Rule out  generalized anxiety disorder  Axis II: Deferred  Axis III:  Past Medical History  Diagnosis Date  . High blood pressure   . PTSD (post-traumatic stress disorder)   . Acid reflux   . Anxiety   . Hypothyroid     Axis IV: Psychosocial. History of trauma   Treatment Plan and Summary: Continue Klonopin for her panic attacks. Also denies anxiety disorder. She is not interested to get started on PTSD medication or an SSRI She has conflicts with her daughter that stresses her out.   Pertinent Labs and Relevant Prior Notes reviewed. Medication Side effects, benefits and risks reviewed/discussed with Patient. Time given for patient to respond and asks questions regarding the Diagnosis and Medications. Safety concerns and to report to ER if suicidal or call 911. Relevant Medications refilled or called in to pharmacy. Discussed weight maintenance and Sleep Hygiene. Follow up with Primary care provider in regards to Medical conditions. Recommend compliance with medications and follow up office appointments. Discussed to avail opportunity to consider or/and continue Individual therapy with Counselor. Greater than 50% of time was spend in counseling and  coordination of care with the patient.  Schedule for Follow up visit in 8 weeks or call in earlier as necessary.  Time spent: 25 minutes  Merian Capron, MD 05/08/2015

## 2015-05-19 ENCOUNTER — Ambulatory Visit (HOSPITAL_COMMUNITY): Payer: Self-pay | Admitting: Psychiatry

## 2015-06-08 ENCOUNTER — Other Ambulatory Visit (HOSPITAL_COMMUNITY): Payer: Self-pay | Admitting: Psychiatry

## 2015-06-09 NOTE — Telephone Encounter (Signed)
Pt called for a refill for Klonopin 0.5mg . Per Dr. Gilmore LarocheAkhtar, pt is authorized for a refill for Klonopin 0.5mg , Qty 90. Prescription was phoned into [facility]. Pt has a f/u appt on 8/12. Called and informed pt of prescription status. Pt verbalizes understanding.

## 2015-07-09 ENCOUNTER — Telehealth (HOSPITAL_COMMUNITY): Payer: Self-pay | Admitting: *Deleted

## 2015-07-09 MED ORDER — CLONAZEPAM 0.5 MG PO TABS: 0.5000 mg | ORAL_TABLET | Freq: Three times a day (TID) | ORAL | Status: DC | PRN

## 2015-07-09 NOTE — Telephone Encounter (Signed)
Pt called for a refill for Klonopin 0.5mg . Per Dr. Gilmore Laroche, pt is authorized for a 7 days of Klonopin 0.5mg , Qty 21. Prescription was phoned into [facility]. Spoke with Tammy Sours. Per Tammy Sours , pt will be notified once prescription is ready.  Pt has a f/u appt on 07/17/15. Pt verbalizes understanding.

## 2015-07-17 ENCOUNTER — Encounter (HOSPITAL_COMMUNITY): Payer: Self-pay | Admitting: Psychiatry

## 2015-07-17 ENCOUNTER — Ambulatory Visit (INDEPENDENT_AMBULATORY_CARE_PROVIDER_SITE_OTHER): Payer: 59 | Admitting: Psychiatry

## 2015-07-17 VITALS — BP 137/75 | HR 80 | Ht 63.0 in | Wt 129.0 lb

## 2015-07-17 DIAGNOSIS — F4310 Post-traumatic stress disorder, unspecified: Secondary | ICD-10-CM

## 2015-07-17 DIAGNOSIS — F410 Panic disorder [episodic paroxysmal anxiety] without agoraphobia: Secondary | ICD-10-CM

## 2015-07-17 DIAGNOSIS — F17200 Nicotine dependence, unspecified, uncomplicated: Secondary | ICD-10-CM | POA: Diagnosis not present

## 2015-07-17 DIAGNOSIS — F411 Generalized anxiety disorder: Secondary | ICD-10-CM

## 2015-07-17 MED ORDER — CLONAZEPAM 0.5 MG PO TABS: 0.5000 mg | ORAL_TABLET | Freq: Three times a day (TID) | ORAL | Status: DC | PRN

## 2015-07-17 NOTE — Progress Notes (Signed)
Patient ID: Claire [name], female   DOB: 09-18-1951, 64 y.o.   MRN: [id number]  [facility] Outpatient Follow up visit  Claire [name] [id number] 64 y.o.  07/17/2015 3:12 PM  Chief Complaint:  Establish care for panic attacks  History of Present Illness:   Patient Presents for follow up and medication management for  panic symptoms. Diagnosed with PTSD, panic symptoms and GAD.  Initial presentation was  "Patient is a 63 years old currently divorced Caucasian female is living with her daughter and son and son-in-law. She has been seeing Dr. Joycelyn Man for panic disorder. Referred because he does not want to continue prescribed Klonopin and wanted her to establish care with a psychiatrist. 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 After that she is not able to go back to store she started having nausea dizziness chest pain feeling of choking [place] palpitations. She started developing more panic symptoms and apprehension of having another panic attack. In 2012 she was disabled with panic disorder and is on disability. At times she feels a panic attack him out of the blue she worries about having another panic attack. She is a panic attacks has affected her work and she is not able to perform work since 2012 because of apprehension, panic, feeling of unpredictable excessive worries. She has been evaluated and medically cleared she is currently on hypothyroid medication"  Continues to take klonopine. She is followed with a gastroenterologist and her irritable bowel is better which has helped her worries down.  Medical complexity; she may get started with Steroid for her irritable bowel. This irritable bowel status affect her anxiety and she starts worrying about it PTSD: childhool summer she would go to other grand parents house. She had to sleep far room with no lights. Still has nightmares and keeps  light on at night now. Anxiety and panic are more controlled when she is taking 3 tablets a  Day.  Nicotine: she smokes but not inhale too much. Planning to quit with her daughter.  Aggravating factors: stress and crowds. Medical problems  Modifying factors; her grandkid her daughter spends some time with the family. Severity of anxiety: 5./10. 10 being extreme anxiety         Past Psychiatric History/Hospitalization(s) Denies hospitalizations she has been treated for panic and anxiety since 13 years. There is no history of suicide   Hospitalization for psychiatric illness: No History of Electroconvulsive Shock Therapy: No Prior Suicide Attempts: No  Medical History; Past Medical History  Diagnosis Date  . High blood pressure   . PTSD (post-traumatic stress disorder)   . Acid reflux   . Anxiety   . Hypothyroid     Allergies: Allergies  Allergen Reactions  . Macrodantin [Nitrofurantoin Macrocrystal] Rash  . Zithromax [Azithromycin]     Medications: Outpatient Encounter Prescriptions as of 07/17/2015  Medication Sig  . albuterol (PROVENTIL) (2.5 MG/3ML) 0.083% nebulizer solution Take 2.5 mg by nebulization every 6 (six) hours as needed for wheezing or shortness of breath.  [name] atenolol (TENORMIN) 50 MG tablet Take 50 mg by mouth daily.    . Budesonide (UCERIS PO) Take by mouth.  . Calcium Carbonate-Vitamin D (CALCIUM-VITAMIN D) 500-200 MG-UNIT per tablet Take 1 tablet by mouth daily.  . clonazePAM (KLONOPIN) 0.5 MG tablet Take 1 tablet (0.5 mg total) by mouth 3 (three) times daily as needed for anxiety.  [name] levothyroxine (SYNTHROID,  LEVOTHROID) 50 MCG tablet Take 50 mcg by mouth daily before breakfast.  . mometasone-formoterol (DULERA) 100-5 MCG/ACT AERO Inhale 2 puffs into the lungs 2 (two) times daily.  [name] omeprazole (PRILOSEC) 20 MG capsule Take 20 mg by mouth daily.  . potassium chloride (K-DUR,KLOR-CON) 10 MEQ tablet Take 10 mEq by mouth 2 (two) times daily.  .  [DISCONTINUED] clonazePAM (KLONOPIN) 0.5 MG tablet Take 1 tablet (0.5 mg total) by mouth 3 (three) times daily as needed for anxiety.   No facility-administered encounter medications on file as of 07/17/2015.    Family History; Family History  Problem Relation Age of Onset  . Anxiety disorder Mother   . OCD Daughter   . Alcohol abuse Father   . Anxiety disorder Sister        Labs:  No results found for this or any previous visit (from the past 2160 hour(s)).     Musculoskeletal: Strength & Muscle Tone: within normal limits Gait & Station: normal Patient leans: N/A  Mental Status Examination;   Psychiatric Specialty Exam: Physical Exam  Constitutional: She appears well-nourished.  Skin: She is not diaphoretic.    Review of Systems  Constitutional: Negative for fever.  Cardiovascular: Negative for chest pain.  Skin: Negative for rash.  Neurological: Negative for headaches.  Psychiatric/Behavioral: Negative for hallucinations and substance abuse. The patient is nervous/anxious.     Blood pressure 137/75, pulse 80, height 5\' 3"  (1.6 m), weight 129 lb (58.514 kg).Body mass index is 22.86 kg/(m^2).  General Appearance: Casual  Eye Contact::  Fair  Speech:  Slow  Volume:  Decreased  Mood:  Euthymic  Affect:  Restricted  Thought Process:  Coherent  Orientation:  Full (Time, Place, and Person)  Thought Content:  Rumination  Suicidal Thoughts:  No  Homicidal Thoughts:  No  Memory:  Immediate;   Fair Recent;   Fair  Judgement:  Fair  Insight:  Shallow  Psychomotor Activity:  Normal  Concentration:  Fair  Recall:  Fair  Akathisia:  Negative  Handed:  Right  AIMS (if indicated):     Assets:  Social Support Vocational/Educational  Sleep:        Assessment: Axis I: Panic disorder. Possible PTSD. Rule out generalized anxiety disorder. Nicotine dependence  Axis II: Deferred  Axis III:  Past Medical History  Diagnosis Date  . High blood pressure   . PTSD  (post-traumatic stress disorder)   . Acid reflux   . Anxiety   . Hypothyroid     Axis IV: Psychosocial. History of trauma   Treatment Plan and Summary: Continue Klonopin for her panic attacks.  She is not interested to get started on PTSD medication or an SSRI She has conflicts with her daughter that stresses her out.  Nicotine: planning to quit in next one month.  GI symptoms following with Gastro enterologist.   Pertinent Labs and Relevant Prior Notes reviewed. Medication Side effects, benefits and risks reviewed/discussed with Patient. Time given for patient to respond and asks questions regarding the Diagnosis and Medications. Safety concerns and to report to ER if suicidal or call 911. Relevant Medications refilled or called in to pharmacy. Discussed weight maintenance and Sleep Hygiene. Follow up with Primary care provider in regards to Medical conditions. Recommend compliance with medications and follow up office appointments. Discussed to avail opportunity to consider or/and continue Individual therapy with Counselor. Greater than 50% of time was spend in counseling and coordination of care with the patient.  Schedule for Follow up visit  in 8 weeks or call in earlier as necessary.  Time spent: 25 minutes  Thresa Ross, MD 07/17/2015

## 2015-08-11 DIAGNOSIS — Z79899 Other long term (current) drug therapy: Secondary | ICD-10-CM | POA: Insufficient documentation

## 2015-08-18 ENCOUNTER — Telehealth (HOSPITAL_COMMUNITY): Payer: Self-pay | Admitting: *Deleted

## 2015-09-14 ENCOUNTER — Telehealth (HOSPITAL_COMMUNITY): Payer: Self-pay | Admitting: *Deleted

## 2015-09-14 MED ORDER — CLONAZEPAM 0.5 MG PO TABS: 0.5000 mg | ORAL_TABLET | Freq: Three times a day (TID) | ORAL | Status: DC | PRN

## 2015-09-14 NOTE — Telephone Encounter (Signed)
Pt called for a refill for Klonopin 0.5mg . Per Dr. Gilmore Laroche, pt is authorized for a refill for Klonopin 0.5mg , #90. Prescription was phone into pharmacy. Pt has a f/u appt on 09/22/15. Called and informed pt of prescription status. Pt verbalizes understanding.

## 2015-09-14 NOTE — Telephone Encounter (Signed)
Pt needs a refill for Klonopin. Call once prescription is written at [phone]

## 2015-09-15 ENCOUNTER — Ambulatory Visit (HOSPITAL_COMMUNITY): Payer: Self-pay | Admitting: Psychiatry

## 2015-09-22 ENCOUNTER — Ambulatory Visit (HOSPITAL_COMMUNITY): Payer: Self-pay | Admitting: Psychiatry

## 2015-09-29 ENCOUNTER — Ambulatory Visit (INDEPENDENT_AMBULATORY_CARE_PROVIDER_SITE_OTHER): Payer: 59 | Admitting: Psychiatry

## 2015-09-29 ENCOUNTER — Encounter (HOSPITAL_COMMUNITY): Payer: Self-pay | Admitting: Psychiatry

## 2015-09-29 VITALS — BP 128/76 | HR 71 | Ht 63.0 in | Wt 138.0 lb

## 2015-09-29 DIAGNOSIS — F411 Generalized anxiety disorder: Secondary | ICD-10-CM

## 2015-09-29 DIAGNOSIS — F410 Panic disorder [episodic paroxysmal anxiety] without agoraphobia: Secondary | ICD-10-CM

## 2015-09-29 DIAGNOSIS — F4310 Post-traumatic stress disorder, unspecified: Secondary | ICD-10-CM

## 2015-09-29 MED ORDER — CLONAZEPAM 0.5 MG PO TABS: 0.5000 mg | ORAL_TABLET | Freq: Three times a day (TID) | ORAL | Status: DC | PRN

## 2015-09-29 NOTE — Progress Notes (Signed)
Patient ID: Claire [name], female   DOB: [birth year], 64 y.o.   MRN: [id number]  [facility] Outpatient Follow up visit  Claire [name] [id number] 64 y.o.  09/29/2015 4:06 PM  Chief Complaint:  Establish care for panic attacks  History of Present Illness:   Patient Presents for follow up and medication management for  panic symptoms. Diagnosed with PTSD, panic symptoms and GAD.  Initial presentation was  "Patient is a 64 years old currently divorced Caucasian female is living with her daughter and son and son-in-law. She has been seeing Dr. Joycelyn Man for panic disorder. Referred because he does not want to continue prescribed Klonopin and wanted her to establish care with a psychiatrist. 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 After that she is not able to go back to store she started having nausea dizziness chest pain feeling of choking [place] palpitations. She started developing more panic symptoms and apprehension of having another panic attack. In 2012 she was disabled with panic disorder and is on disability. At times she feels a panic attack him out of the blue she worries about having another panic attack. She is a panic attacks has affected her work and she is not able to perform work since 2012 because of apprehension, panic, feeling of unpredictable excessive worries. She has been evaluated and medically cleared she is currently on hypothyroid medication"  Continues to take klonopine. She is followed with a gastroenterologist and her irritable bowel is better.  Medical complexity; Steroid helped irritable bowel but has concerns. dont want to be on it for longterm  PTSD: childhool summer she would go to other grand parents house. She had to sleep far room with no lights. Still has nightmares and keeps light on at night now. Anxiety and panic are more controlled when she is taking 3  tablets a  Day.  Does not want to be on SSri Nicotine: she smokes but not inhale too much. Planning to quit with her daughter.  Aggravating factors: stress and crowds. Medical problems  Modifying factors; her grandkid her daughter spends some time with the family. Severity of anxiety: 4./10. 10 being extreme anxiety         Past Psychiatric History/Hospitalization(s) Denies hospitalizations she has been treated for panic and anxiety since 13 years. There is no history of suicide   Hospitalization for psychiatric illness: No History of Electroconvulsive Shock Therapy: No Prior Suicide Attempts: No  Medical History; Past Medical History  Diagnosis Date  . High blood pressure   . PTSD (post-traumatic stress disorder)   . Acid reflux   . Anxiety   . Hypothyroid     Allergies: Allergies  Allergen Reactions  . Macrodantin [Nitrofurantoin Macrocrystal] Rash  . Zithromax [Azithromycin]     Medications: Outpatient Encounter Prescriptions as of 09/29/2015  Medication Sig  . albuterol (PROVENTIL) (2.5 MG/3ML) 0.083% nebulizer solution Take 2.5 mg by nebulization every 6 (six) hours as needed for wheezing or shortness of breath.  [name] atenolol (TENORMIN) 50 MG tablet Take 50 mg by mouth daily.    . Budesonide (UCERIS PO) Take by mouth.  . Calcium Carbonate-Vitamin D (CALCIUM-VITAMIN D) 500-200 MG-UNIT per tablet Take 1 tablet by mouth daily.  . clonazePAM (KLONOPIN) 0.5 MG tablet Take 1 tablet (0.5 mg total) by mouth 3 (three) times daily as needed for anxiety. Fill when due.  . levothyroxine (SYNTHROID, LEVOTHROID) 50 MCG  tablet Take 50 mcg by mouth daily before breakfast.  . mometasone-formoterol (DULERA) 100-5 MCG/ACT AERO Inhale 2 puffs into the lungs 2 (two) times daily.  [name]. omeprazole (PRILOSEC) 20 MG capsule Take 20 mg by mouth daily.  . potassium chloride (K-DUR,KLOR-CON) 10 MEQ tablet Take 10 mEq by mouth 2 (two) times daily.  . [DISCONTINUED] clonazePAM (KLONOPIN) 0.5 MG  tablet Take 1 tablet (0.5 mg total) by mouth 3 (three) times daily as needed for anxiety.   No facility-administered encounter medications on file as of 09/29/2015.    Family History; Family History  Problem Relation Age of Onset  . Anxiety disorder Mother   . OCD Daughter   . Alcohol abuse Father   . Anxiety disorder Sister        Labs:  No results found for this or any previous visit (from the past 2160 hour(s)).     Musculoskeletal: Strength & Muscle Tone: within normal limits Gait & Station: normal Patient leans: N/A  Mental Status Examination;   Psychiatric Specialty Exam: Physical Exam  Constitutional: She appears well-nourished.  Skin: She is not diaphoretic.    Review of Systems  Constitutional: Negative for fever.  Cardiovascular: Negative for chest pain.  Skin: Negative for rash.  Neurological: Negative for tremors and headaches.  Psychiatric/Behavioral: Negative for hallucinations and substance abuse.    Blood pressure 128/76, pulse 71, height 5\' 3"  (1.6 m), weight 138 lb (62.596 kg), SpO2 95 %.Body mass index is 24.45 kg/(m^2).  General Appearance: Casual  Eye Contact::  Fair  Speech:  Slow  Volume:  Decreased  Mood:  Euthymic  Affect:  Restricted  Thought Process:  Coherent  Orientation:  Full (Time, Place, and Person)  Thought Content:  Rumination  Suicidal Thoughts:  No  Homicidal Thoughts:  No  Memory:  Immediate;   Fair Recent;   Fair  Judgement:  Fair  Insight:  Shallow  Psychomotor Activity:  Normal  Concentration:  Fair  Recall:  Fair  Akathisia:  Negative  Handed:  Right  AIMS (if indicated):     Assets:  Social Support Vocational/Educational  Sleep:        Assessment: Axis I: Panic disorder. Possible PTSD. Rule out generalized anxiety disorder. Nicotine dependence  Axis II: Deferred  Axis III:  Past Medical History  Diagnosis Date  . High blood pressure   . PTSD (post-traumatic stress disorder)   . Acid reflux   .  Anxiety   . Hypothyroid     Axis IV: Psychosocial. History of trauma   Treatment Plan and Summary: Continue Klonopin for her panic attacks.  She is not interested to get started on PTSD medication or an SSRI GAD: continue klonopine.  She has conflicts with her daughter that stresses her out.  Nicotine: planning to quit in next one month.  GI symptoms following with Gastro enterologist.   Pertinent Labs and Relevant Prior Notes reviewed. Medication Side effects, benefits and risks reviewed/discussed with Patient. Time given for patient to respond and asks questions regarding the Diagnosis and Medications. Safety concerns and to report to ER if suicidal or call 911. Relevant Medications refilled or called in to pharmacy. Discussed weight maintenance and Sleep Hygiene. Follow up with Primary care provider in regards to Medical conditions. Recommend compliance with medications and follow up office appointments. Discussed to avail opportunity to consider or/and continue Individual therapy with Counselor. Greater than 50% of time was spend in counseling and coordination of care with the patient.  Schedule for Follow up  visit in 8 weeks or call in earlier as necessary.  Time spent: 25 minutes  Thresa Ross, MD 09/29/2015

## 2015-11-14 ENCOUNTER — Other Ambulatory Visit (HOSPITAL_COMMUNITY): Payer: Self-pay | Admitting: Psychiatry

## 2015-11-16 NOTE — Telephone Encounter (Signed)
[facility] . Per Dr. Gilmore LarocheAkhtar, please phoned in prescription for Klonopin 0.5mg , #90. Rx was phone into pharmacy. Called and informed pt of prescription status. Pt verbalizes understanding. Pt has a f/u appt on 12/10/15.

## 2015-12-10 ENCOUNTER — Ambulatory Visit (HOSPITAL_COMMUNITY): Payer: 59 | Admitting: Psychiatry

## 2015-12-17 ENCOUNTER — Encounter (HOSPITAL_COMMUNITY): Payer: Self-pay | Admitting: Psychiatry

## 2015-12-17 ENCOUNTER — Ambulatory Visit (INDEPENDENT_AMBULATORY_CARE_PROVIDER_SITE_OTHER): Payer: 59 | Admitting: Psychiatry

## 2015-12-17 VITALS — BP 122/70 | HR 73 | Ht 63.0 in | Wt 134.0 lb

## 2015-12-17 DIAGNOSIS — F411 Generalized anxiety disorder: Secondary | ICD-10-CM | POA: Diagnosis not present

## 2015-12-17 DIAGNOSIS — F17200 Nicotine dependence, unspecified, uncomplicated: Secondary | ICD-10-CM | POA: Diagnosis not present

## 2015-12-17 DIAGNOSIS — F4310 Post-traumatic stress disorder, unspecified: Secondary | ICD-10-CM

## 2015-12-17 DIAGNOSIS — F410 Panic disorder [episodic paroxysmal anxiety] without agoraphobia: Secondary | ICD-10-CM

## 2015-12-17 MED ORDER — CLONAZEPAM 0.5 MG PO TABS: ORAL_TABLET | ORAL | Status: DC

## 2015-12-17 NOTE — Progress Notes (Signed)
Patient ID: Claire [name], female   DOB: 02/12/1951, 65 y.o.   MRN: [id number]  [facility] Outpatient Follow up visit  Claire [name] [id number] 65 y.o.  12/17/2015 1:54 PM  Chief Complaint:  Establish care for panic attacks  History of Present Illness:   Patient Presents for follow up and medication management for  panic symptoms. Diagnosed with PTSD, panic symptoms and GAD.  Initial presentation was  "Patient is a 65 years old currently divorced Caucasian female is living with her daughter and son and son-in-law. She has been seeing Dr. Joycelyn ManZimmerman for panic disorder. Referred because he does not want to continue prescribed Klonopin and wanted her to establish care with a psychiatrist. 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 After that she is not able to go back to store she started having nausea dizziness chest pain feeling of choking us palpitations. She started developing more panic symptoms and apprehension of having another panic attack. In 2012 she was disabled with panic disorder and is on disability. At times she feels a panic attack him out of the blue she worries about having another panic attack. She is a panic attacks has affected her work and she is not able to perform work since 2012 because of apprehension, panic, feeling of unpredictable excessive worries. She has been evaluated and medically cleared she is currently on hypothyroid medication"  Continues to take klonopine. She is followed with a gastroenterologist for irritable bowel. Today she is feeling somewhat sick because of upper respiratory infection she is getting treatment for. Overall she feels her mood and anxiety symptoms are reasonably controlled on medications No significant flashbacks. Infrequent panic attacks. Medical complexity; irritable bowel. Flu like symptoms  PTSD: childhool summer she would go to  other grand parents house. She had to sleep far room with no lights. Still has nightmares and keeps light on at night now. Anxiety and panic are more controlled when she is taking 3 tablets a  Day.  Does not want to be on SSri Nicotine: she smokes but not inhale too much. Planning to quit with her daughter.  Aggravating factors: stress and crowds. Medical problems  Modifying factors; her grandkid her daughter spends some time with the family. Severity of anxiety: 4./10. 10 being extreme anxiety     Past Psychiatric History/Hospitalization(s) Denies hospitalizations she has been treated for panic and anxiety since 13 years. There is no history of suicide   Hospitalization for psychiatric illness: No History of Electroconvulsive Shock Therapy: No Prior Suicide Attempts: No  Medical History; Past Medical History  Diagnosis Date  . High blood pressure   . PTSD (post-traumatic stress disorder)   . Acid reflux   . Anxiety   . Hypothyroid     Allergies: Allergies  Allergen Reactions  . Macrodantin [Nitrofurantoin Macrocrystal] Rash  . Zithromax [Azithromycin]     Medications: Outpatient Encounter Prescriptions as of 12/17/2015  Medication Sig  . albuterol (PROVENTIL) (2.5 MG/3ML) 0.083% nebulizer solution Take 2.5 mg by nebulization every 6 (six) hours as needed for wheezing or shortness of breath.  [name]. atenolol (TENORMIN) 50 MG tablet Take 50 mg by mouth daily.    . Budesonide (UCERIS PO) Take by mouth.  . Calcium Carbonate-Vitamin D (CALCIUM-VITAMIN D) 500-200 MG-UNIT per tablet Take 1 tablet by mouth daily.  . clonazePAM (KLONOPIN) 0.5 MG tablet take 1 tablet by mouth three times a day  if needed for anxiety  . levothyroxine (SYNTHROID, LEVOTHROID) 50 MCG tablet Take 50 mcg by mouth daily before breakfast.  . mometasone-formoterol (DULERA) 100-5 MCG/ACT AERO Inhale 2 puffs into the lungs 2 (two) times daily.  . mometasone-formoterol (DULERA) 100-5 MCG/ACT AERO inhale 2 puffs by  mouth daily  . omeprazole (PRILOSEC) 20 MG capsule Take 20 mg by mouth daily.  . potassium chloride (K-DUR,KLOR-CON) 10 MEQ tablet Take 10 mEq by mouth 2 (two) times daily.  . [DISCONTINUED] clonazePAM (KLONOPIN) 0.5 MG tablet take 1 tablet by mouth three times a day if needed for anxiety   No facility-administered encounter medications on file as of 12/17/2015.    Family History; Family History  Problem Relation Age of Onset  . Anxiety disorder Mother   . OCD Daughter   . Alcohol abuse Father   . Anxiety disorder Sister        Labs:  No results found for this or any previous visit (from the past 2160 hour(s)).     Musculoskeletal: Strength & Muscle Tone: within normal limits Gait & Station: normal Patient leans: N/A  Mental Status Examination;   Psychiatric Specialty Exam: Physical Exam  Constitutional: She appears well-nourished.  Skin: She is not diaphoretic.    Review of Systems  Constitutional: Negative for fever.  Cardiovascular: Negative for chest pain.  Skin: Negative for rash.  Neurological: Negative for tremors and headaches.  Psychiatric/Behavioral: Negative for depression, hallucinations and substance abuse.    Blood pressure 122/70, pulse 73, height 5\' 3"  (1.6 m), weight 134 lb (60.782 kg), SpO2 91 %.Body mass index is 23.74 kg/(m^2).  General Appearance: Casual  Eye Contact::  Fair  Speech:  Slow  Volume:  Decreased  Mood:  Euthymic  Affect:  Restricted  Thought Process:  Coherent  Orientation:  Full (Time, Place, and Person)  Thought Content:  Rumination  Suicidal Thoughts:  No  Homicidal Thoughts:  No  Memory:  Immediate;   Fair Recent;   Fair  Judgement:  Fair  Insight:  Shallow  Psychomotor Activity:  Normal  Concentration:  Fair  Recall:  Fair  Akathisia:  Negative  Handed:  Right  AIMS (if indicated):     Assets:  Social Support Vocational/Educational  Sleep:        Assessment: Axis I: Panic disorder. Possible PTSD. Rule  out generalized anxiety disorder. Nicotine dependence  Axis II: Deferred  Axis III:  Past Medical History  Diagnosis Date  . High blood pressure   . PTSD (post-traumatic stress disorder)   . Acid reflux   . Anxiety   . Hypothyroid     Axis IV: Psychosocial. History of trauma   Treatment Plan and Summary: Continue Klonopin for her panic attacks.  She is not interested to get started on PTSD medication or an SSRI GAD: continue klonopine. No significant flashbacks.  She has conflicts with her daughter that stresses her.  Nicotine: planning to quit but not ready.     Pertinent Labs and Relevant Prior Notes reviewed. Medication Side effects, benefits and risks reviewed/discussed with Patient. Time given for patient to respond and asks questions regarding the Diagnosis and Medications. Safety concerns and to report to ER if suicidal or call 911. Relevant Medications refilled or called in to pharmacy. Discussed weight maintenance and Sleep Hygiene. Follow up with Primary care provider in regards to Medical conditions. Recommend compliance with medications and follow up office appointments. Discussed to avail opportunity to consider or/and continue Individual therapy with Counselor. Greater than 50%  of time was spend in counseling and coordination of care with the patient.  Schedule for Follow up visit in 8 weeks or call in earlier as necessary.  Time spent: 25 minutes  Thresa Ross, MD 12/17/2015

## 2016-01-17 ENCOUNTER — Other Ambulatory Visit (HOSPITAL_COMMUNITY): Payer: Self-pay | Admitting: Psychiatry

## 2016-01-28 NOTE — Telephone Encounter (Signed)
[facility] 0.5mg . Per verbal order from Dr. Gilmore Laroche, please call prescription into [facility] for Klonopin 0.5mg , #90. Verbal order was given to Tolar at [facility]. Pt is schedule for a f/u appt on 03/07/16. Pharmacy will notify pt once prescription is ready.

## 2016-03-07 ENCOUNTER — Ambulatory Visit (HOSPITAL_COMMUNITY): Payer: Self-pay | Admitting: Psychiatry

## 2016-03-16 ENCOUNTER — Other Ambulatory Visit (HOSPITAL_COMMUNITY): Payer: Self-pay | Admitting: Psychiatry

## 2016-03-16 NOTE — Telephone Encounter (Signed)
[facility] . Per Dr. Gilmore LarocheAkhtar, medication request is denied. PT will need to schedule appt with clinic. LVM for pt to contact clinic for appt.

## 2016-03-17 ENCOUNTER — Telehealth (HOSPITAL_COMMUNITY): Payer: Self-pay | Admitting: Psychiatry

## 2016-03-17 NOTE — Telephone Encounter (Signed)
Return telephone call to pt. Pt voice concerns about a refill for Klonopin. Informed pt, she missed her appt on 03/07/16. Pt will need to be seen before refill could be issued. Pt was last seen 12/28/15. Pt states she will have enough medication until appt. Informed pt, she will need to proceed to urgent care or local emergency room. Pt is schedule for a f/u appt on 03/21/16. Pt verbalizes understanding.

## 2016-03-21 ENCOUNTER — Ambulatory Visit (INDEPENDENT_AMBULATORY_CARE_PROVIDER_SITE_OTHER): Payer: 59 | Admitting: Psychiatry

## 2016-03-21 ENCOUNTER — Encounter (HOSPITAL_COMMUNITY): Payer: Self-pay | Admitting: Psychiatry

## 2016-03-21 VITALS — BP 128/70 | HR 65 | Ht 63.0 in | Wt 139.0 lb

## 2016-03-21 DIAGNOSIS — F410 Panic disorder [episodic paroxysmal anxiety] without agoraphobia: Secondary | ICD-10-CM

## 2016-03-21 DIAGNOSIS — F411 Generalized anxiety disorder: Secondary | ICD-10-CM

## 2016-03-21 DIAGNOSIS — F4310 Post-traumatic stress disorder, unspecified: Secondary | ICD-10-CM | POA: Diagnosis not present

## 2016-03-21 MED ORDER — CLONAZEPAM 0.5 MG PO TABS: ORAL_TABLET | ORAL | Status: DC

## 2016-03-21 NOTE — Progress Notes (Signed)
Patient ID: Claire [name], female   DOB: 06/24/1951, 65 y.o.   MRN: [id number]  [facility]  Claire [name] [id number] 65 y.o.  03/21/2016 2:22 PM  Chief Complaint:  Establish care for panic attacks  History of Present Illness:   Patient Presents for follow up and medication management for  panic symptoms. Diagnosed with PTSD, panic symptoms and GAD.  She has initially presented with panic and depressive symptoms. She does reasonable on klonopine and has not tolerated other medications.   Continues to take klonopine. She is followed with a gastroenterologist for irritable bowel. Has run low on klonopine due to no show and her anxiety came back. She has cut down dose recently so not to get out of klonopin altogether.  No significant flashbacks. Infrequent panic attacks. Medical complexity; irritable bowel. Flu like symptoms (resolved) Husband has prostate cancer. Trying to help him out. PTSD: childhool summer she would go to other grand parents house. She had to sleep far room with no lights. Still has nightmares and keeps light on at night now. Anxiety and panic are more controlled when she is taking 3 tablets a  Day.  Does not want to be on SSri Nicotine: she smokes but not inhale too much. Aggravating factors: stress and crowds. Medical problems  Modifying factors; her grandkid her daughter spends some time with the family. Severity of anxiety: 4./10. 10 being extreme anxiety    Hospitalization for psychiatric illness: No History of Electroconvulsive Shock Therapy: No Prior Suicide Attempts: No  Medical History; Past Medical History  Diagnosis Date  . High blood pressure   . PTSD (post-traumatic stress disorder)   . Acid reflux   . Anxiety   . Hypothyroid     Allergies: Allergies  Allergen Reactions  . Macrodantin [Nitrofurantoin Macrocrystal] Rash  . Zithromax [Azithromycin]     Medications: Outpatient Encounter  Prescriptions as of 03/21/2016  Medication Sig  . albuterol (PROVENTIL) (2.5 MG/3ML) 0.083% nebulizer solution Take 2.5 mg by nebulization every 6 (six) hours as needed for wheezing or shortness of breath.  [name]. atenolol (TENORMIN) 50 MG tablet Take 50 mg by mouth daily.    . Budesonide (UCERIS PO) Take by mouth.  . Calcium Carbonate-Vitamin D (CALCIUM-VITAMIN D) 500-200 MG-UNIT per tablet Take 1 tablet by mouth daily.  . clonazePAM (KLONOPIN) 0.5 MG tablet take 1 tablet by mouth three times a day if needed for anxiety  . levothyroxine (SYNTHROID, LEVOTHROID) 50 MCG tablet Take 50 mcg by mouth daily before breakfast.  . mometasone-formoterol (DULERA) 100-5 MCG/ACT AERO Inhale 2 puffs into the lungs 2 (two) times daily.  [name]. omeprazole (PRILOSEC) 20 MG capsule Take 20 mg by mouth daily.  . potassium chloride (K-DUR,KLOR-CON) 10 MEQ tablet Take 10 mEq by mouth 2 (two) times daily.  . [DISCONTINUED] clonazePAM (KLONOPIN) 0.5 MG tablet take 1 tablet by mouth three times a day if needed for anxiety  . [DISCONTINUED] mometasone-formoterol (DULERA) 100-5 MCG/ACT AERO Reported on 03/21/2016   No facility-administered encounter medications on file as of 03/21/2016.    Family History; Family History  Problem Relation Age of Onset  . Anxiety disorder Mother   . OCD Daughter   . Alcohol abuse Father   . Anxiety disorder Sister        Labs:  No results found for this or any previous visit (from the past 2160 hour(s)).     Musculoskeletal: Strength & Muscle Tone: within normal limits Gait & Station: normal Patient  leans: N/A  Mental Status Examination;   Psychiatric Specialty Exam: Physical Exam  Constitutional: She appears well-nourished.  Skin: She is not diaphoretic.    Review of Systems  Constitutional: Negative for fever.  Cardiovascular: Negative for chest pain.  Skin: Negative for rash.  Neurological: Negative for tremors and headaches.  Psychiatric/Behavioral: Negative for  depression, suicidal ideas, hallucinations and substance abuse.    Blood pressure 128/70, pulse 65, height  (1.6 m), weight 139 lb (63.05 kg), SpO2 96 %.Body mass index is 24.63 kg/(m^2).  General Appearance: Casual  Eye Contact::  Fair  Speech:  Slow  Volume:  Decreased  Mood:  Euthymic  Affect:  Restricted  Thought Process:  Coherent  Orientation:  Full (Time, Place, and Person)  Thought Content:  Rumination  Suicidal Thoughts:  No  Homicidal Thoughts:  No  Memory:  Immediate;   Fair Recent;   Fair  Judgement:  Fair  Insight:  Shallow  Psychomotor Activity:  Normal  Concentration:  Fair  Recall:  Fair  Akathisia:  Negative  Handed:  Right  AIMS (if indicated):     Assets:  Social Support Vocational/Educational  Sleep:        Assessment: Axis I: Panic disorder. Possible PTSD. Rule out generalized anxiety disorder. Nicotine dependence  Axis II: Deferred  Axis III:  Past Medical History  Diagnosis Date  . High blood pressure   . PTSD (post-traumatic stress disorder)   . Acid reflux   . Anxiety   . Hypothyroid     Axis IV: Psychosocial. History of trauma   Treatment Plan and Summary: Continue Klonopin for her panic attacks.  She is not interested to get started on PTSD medication or an SSRI GAD: continue klonopine. No significant flashbacks.  She has conflicts with her daughter and her husband is sick. This upsets her Nicotine: planning to quit but not ready.     Pertinent Labs and Relevant Prior Notes reviewed. Medication Side effects, benefits and risks reviewed/discussed with Patient. Time given for patient to respond and asks questions regarding the Diagnosis and Medications. Safety concerns and to report to ER if suicidal or call 911. Relevant Medications refilled or called in to pharmacy. Discussed weight maintenance and Sleep Hygiene. Follow up with Primary care provider in regards to Medical conditions. Recommend compliance with medications  and follow up office appointments. Discussed to avail opportunity to consider or/and continue Individual therapy with Counselor. Greater than 50% of time was spend in counseling and coordination of care with the patient.  Schedule for Follow up visit in 8 weeks or call in earlier as necessary.  Time spent: 25 minutes  Thresa Ross, MD 03/21/2016

## 2016-03-25 ENCOUNTER — Ambulatory Visit (HOSPITAL_COMMUNITY): Payer: Self-pay | Admitting: Psychiatry

## 2016-05-15 ENCOUNTER — Telehealth (HOSPITAL_COMMUNITY): Payer: Self-pay | Admitting: Psychiatry

## 2016-05-17 ENCOUNTER — Telehealth (HOSPITAL_COMMUNITY): Payer: Self-pay | Admitting: *Deleted

## 2016-05-17 MED ORDER — CLONAZEPAM 0.5 MG PO TABS: ORAL_TABLET | ORAL | Status: DC

## 2016-05-17 NOTE — Telephone Encounter (Signed)
Pt will need a prescription written for Klonopin 0.5mg . Pt will pick up prescription on 05/18/16. Pt is schedule for a f/u appt on 06/08/16.

## 2016-05-17 NOTE — Telephone Encounter (Signed)
klonopine printed for pickup 

## 2016-05-18 NOTE — Telephone Encounter (Signed)
Rx for Klonopin 0.5mg  [phone]. Called and informed pt of rx status. Pt is schedule for a f/u appt on 7/5. Pt verbalizes understanding.

## 2016-06-06 ENCOUNTER — Ambulatory Visit (HOSPITAL_COMMUNITY): Payer: Self-pay | Admitting: Psychiatry

## 2016-06-08 ENCOUNTER — Encounter (HOSPITAL_COMMUNITY): Payer: Self-pay | Admitting: Psychiatry

## 2016-06-08 ENCOUNTER — Ambulatory Visit (INDEPENDENT_AMBULATORY_CARE_PROVIDER_SITE_OTHER): Payer: 59 | Admitting: Psychiatry

## 2016-06-08 VITALS — BP 116/64 | HR 73 | Ht 63.0 in | Wt 140.0 lb

## 2016-06-08 DIAGNOSIS — F411 Generalized anxiety disorder: Secondary | ICD-10-CM | POA: Diagnosis not present

## 2016-06-08 DIAGNOSIS — F410 Panic disorder [episodic paroxysmal anxiety] without agoraphobia: Secondary | ICD-10-CM

## 2016-06-08 DIAGNOSIS — F4310 Post-traumatic stress disorder, unspecified: Secondary | ICD-10-CM | POA: Diagnosis not present

## 2016-06-08 MED ORDER — CLONAZEPAM 0.5 MG PO TABS: ORAL_TABLET | ORAL | Status: DC

## 2016-06-08 NOTE — Progress Notes (Signed)
Patient ID: Claire [name], female   DOB: 06-25-1951, 65 y.o.   MRN: [id number]  [facility]  Claire [name] 65 y.o.  06/08/2016 2:06 PM  Chief Complaint:  Establish care for panic attacks  History of Present Illness:   Patient Presents for follow up and medication management for  panic symptoms. Diagnosed with PTSD, panic symptoms and GAD.  She has initially presented with panic and depressive symptoms. She benefits from klonopine. Other medicines including SSRIs tried did not work and caused nausea. Continues to take klonopine. She is followed with a gastroenterologist for irritable bowel. She is planning to live separately from her daughter and son-in-law it is a toxic condition where she is living she is looking forward to the move and have her son live with her   No significant flashbacks. Infrequent panic attacks. Medical complexity; irritable bowel.  Husband has prostate cancer. Trying to help him out. PTSD: childhool summer she would go to other grand parents house. She had to sleep far room with no lights. Still has nightmares and keeps light on at night now. Anxiety and panic are more controlled when she is taking 3 tablets a  Day.  Does not want to be on SSri Nicotine: she smokes but not inhale too much. Aggravating factors: stress and crowds. Medical problems  Modifying factors; her grandkid her daughter spends some time with the family. Severity of anxiety: 4./10. 10 being extreme anxiety    Hospitalization for psychiatric illness: No History of Electroconvulsive Shock Therapy: No Prior Suicide Attempts: No  Medical History; Past Medical History  Diagnosis Date  . High blood pressure   . PTSD (post-traumatic stress disorder)   . Acid reflux   . Anxiety   . Hypothyroid     Allergies: Allergies  Allergen Reactions  . Macrodantin [Nitrofurantoin Macrocrystal] Rash  . Zithromax [Azithromycin]      Medications: Outpatient Encounter Prescriptions as of 06/08/2016  Medication Sig  . albuterol (PROVENTIL) (2.5 MG/3ML) 0.083% nebulizer solution Take 2.5 mg by nebulization every 6 (six) hours as needed for wheezing or shortness of breath.  [name]. atenolol (TENORMIN) 50 MG tablet Take 50 mg by mouth daily.    . Budesonide (UCERIS PO) Take by mouth.  . Calcium Carbonate-Vitamin D (CALCIUM-VITAMIN D) 500-200 MG-UNIT per tablet Take 1 tablet by mouth daily.  . clonazePAM (KLONOPIN) 0.5 MG tablet take 1 tablet by mouth three times a day if needed for anxiety  . levothyroxine (SYNTHROID, LEVOTHROID) 50 MCG tablet Take 50 mcg by mouth daily before breakfast.  . mometasone-formoterol (DULERA) 100-5 MCG/ACT AERO Inhale 2 puffs into the lungs 2 (two) times daily.  [name]. omeprazole (PRILOSEC) 20 MG capsule Take 20 mg by mouth daily.  . potassium chloride (K-DUR,KLOR-CON) 10 MEQ tablet Take 10 mEq by mouth 2 (two) times daily.  . [DISCONTINUED] clonazePAM (KLONOPIN) 0.5 MG tablet take 1 tablet by mouth three times a day if needed for anxiety   No facility-administered encounter medications on file as of 06/08/2016.    Family History; Family History  Problem Relation Age of Onset  . Anxiety disorder Mother   . OCD Daughter   . Alcohol abuse Father   . Anxiety disorder Sister        Labs:  No results found for this or any previous visit (from the past 2160 hour(s)).     Musculoskeletal: Strength & Muscle Tone: within normal limits Gait & Station: normal Patient leans: N/A  Mental Status  Examination;   Psychiatric Specialty Exam: Physical Exam  Constitutional: She appears well-nourished.  Skin: She is not diaphoretic.    Review of Systems  Constitutional: Negative for fever.  Cardiovascular: Negative for chest pain.  Skin: Negative for rash.  Neurological: Negative for tremors and headaches.  Psychiatric/Behavioral: Negative for depression, suicidal ideas, hallucinations and substance  abuse. The patient is nervous/anxious.     Blood pressure 116/64, pulse 73, height 5\' 3"  (1.6 m), weight 140 lb (63.504 kg), SpO2 96 %.Body mass index is 24.81 kg/(m^2).  General Appearance: Casual  Eye Contact::  Fair  Speech:  Slow  Volume:  Decreased  Mood:  Euthymic  Affect:  Restricted  Thought Process:  Coherent  Orientation:  Full (Time, Place, and Person)  Thought Content:  Rumination  Suicidal Thoughts:  No  Homicidal Thoughts:  No  Memory:  Immediate;   Fair Recent;   Fair  Judgement:  Fair  Insight:  Shallow  Psychomotor Activity:  Normal  Concentration:  Fair  Recall:  Fair  Akathisia:  Negative  Handed:  Right  AIMS (if indicated):     Assets:  Social Support Vocational/Educational  Sleep:        Assessment: Axis I: Panic disorder. Possible PTSD. Rule out generalized anxiety disorder. Nicotine dependence  Axis II: Deferred  Axis III:  Past Medical History  Diagnosis Date  . High blood pressure   . PTSD (post-traumatic stress disorder)   . Acid reflux   . Anxiety   . Hypothyroid     Axis IV: Psychosocial. History of trauma   Treatment Plan and Summary: Continue Klonopin for her panic attacks.  She is not interested to get started on PTSD medication or an SSRI GAD: continue klonopine. Refills given No significant flashbacks.  She has conflicts with her daughter and her husband is sick. This upsets her Plans to move out and looking forward to that. Nicotine: planning to quit but not ready.     Pertinent Labs and Relevant Prior Notes reviewed. Medication Side effects, benefits and risks reviewed/discussed with Patient. Time given for patient to respond and asks questions regarding the Diagnosis and Medications. Safety concerns and to report to ER if suicidal or call 911.  Discussed to avail opportunity to consider or/and continue Individual therapy with Counselor. Greater than 50% of time was spend in counseling and coordination of care with the  patient.  Schedule for Follow up visit in 10  weeks or call in earlier as necessary.  Time spent: 25 minutes  Thresa RossAKHTAR, Nemiah Bubar, MD 06/08/2016

## 2016-07-28 ENCOUNTER — Ambulatory Visit (HOSPITAL_COMMUNITY): Payer: Self-pay | Admitting: Licensed Clinical Social Worker

## 2016-08-13 ENCOUNTER — Other Ambulatory Visit (HOSPITAL_COMMUNITY): Payer: Self-pay | Admitting: Psychiatry

## 2016-08-15 NOTE — Telephone Encounter (Signed)
[facility] for Klonopin. Per Dr. Gilmore LarocheAkhtar, refill is authorize for Klonopin 0.5mg , #42. Per Whitney PostLogan, medication was last filled on 07/16/16. Pharmacy will notify pt of refill status. Pt is schedule for a f/u appt on 9/26. Pt is aware no more refills will be issue until pt is seen. Pt verbalizes understanding.

## 2016-08-30 ENCOUNTER — Ambulatory Visit (INDEPENDENT_AMBULATORY_CARE_PROVIDER_SITE_OTHER): Payer: 59 | Admitting: Psychiatry

## 2016-08-30 ENCOUNTER — Encounter (HOSPITAL_COMMUNITY): Payer: Self-pay | Admitting: Psychiatry

## 2016-08-30 DIAGNOSIS — F411 Generalized anxiety disorder: Secondary | ICD-10-CM

## 2016-08-30 DIAGNOSIS — F4310 Post-traumatic stress disorder, unspecified: Secondary | ICD-10-CM | POA: Diagnosis not present

## 2016-08-30 DIAGNOSIS — F410 Panic disorder [episodic paroxysmal anxiety] without agoraphobia: Secondary | ICD-10-CM

## 2016-08-30 MED ORDER — CLONAZEPAM 0.5 MG PO TABS: ORAL_TABLET | ORAL | 1 refills | Status: DC

## 2016-08-30 NOTE — Progress Notes (Signed)
Patient ID: Claire [name] Hallett, female   DOB: 07-30-1951, 65 y.o.   MRN: [id number]  [facility]  Claire [name] Fewell [id number] 65 y.o.  08/30/2016 3:19 PM  Chief Complaint:  Establish care for panic attacks  History of Present Illness:   Patient Presents for follow up and medication management for  panic symptoms. Diagnosed with PTSD, panic symptoms and GAD.  She has initially presented with panic and depressive symptoms. She benefits from klonopine. Other medicines including SSRIs tried did not work and caused nausea. Continues to take klonopine. She is followed with a gastroenterologist for irritable bowel.  Her main stress is living with her daughter that is stressful she nags her and puts her down. She understands Klonopin in the long run can affect memory but for now she does not want to cut it down as it is the only medication that keeps her in level anxiety wise   No significant flashbacks. Infrequent panic attacks. Medical complexity; irritable bowel.  Husband has prostate cancer. Trying to help him out. PTSD: childhool summer memories at grand parents house.   Nicotine: she smokes but not inhale too much. Aggravating factors: stress and crowds. Medical problems  Modifying factors; her grandkid her daughter spends some time with the family. Severity of anxiety: 4.5./10. 10 being extreme anxiety    Hospitalization for psychiatric illness: No History of Electroconvulsive Shock Therapy: No Prior Suicide Attempts: No  Medical History; Past Medical History:  Diagnosis Date  . Acid reflux   . Anxiety   . High blood pressure   . Hypothyroid   . PTSD (post-traumatic stress disorder)     Allergies: Allergies  Allergen Reactions  . Macrodantin [Nitrofurantoin Macrocrystal] Rash  . Zithromax [Azithromycin]     Medications: Outpatient Encounter Prescriptions as of 08/30/2016  Medication Sig  . albuterol (PROVENTIL) (2.5 MG/3ML) 0.083%  nebulizer solution Take 2.5 mg by nebulization every 6 (six) hours as needed for wheezing or shortness of breath.  [name]. atenolol (TENORMIN) 50 MG tablet Take 50 mg by mouth daily.    . Budesonide (UCERIS PO) Take by mouth.  . Calcium Carbonate-Vitamin D (CALCIUM-VITAMIN D) 500-200 MG-UNIT per tablet Take 1 tablet by mouth daily.  . clonazePAM (KLONOPIN) 0.5 MG tablet take 1 tablet by mouth three times a day if needed for anxiety  . levothyroxine (SYNTHROID, LEVOTHROID) 50 MCG tablet Take 50 mcg by mouth daily before breakfast.  . mometasone-formoterol (DULERA) 100-5 MCG/ACT AERO Inhale 2 puffs into the lungs 2 (two) times daily.  [name]. omeprazole (PRILOSEC) 20 MG capsule Take 20 mg by mouth daily.  . potassium chloride (K-DUR,KLOR-CON) 10 MEQ tablet Take 10 mEq by mouth 2 (two) times daily.  . [DISCONTINUED] clonazePAM (KLONOPIN) 0.5 MG tablet take 1 tablet by mouth three times a day if needed for anxiety   No facility-administered encounter medications on file as of 08/30/2016.     Family History; Family History  Problem Relation Age of Onset  . Anxiety disorder Mother   . OCD Daughter   . Alcohol abuse Father   . Anxiety disorder Sister        Labs:  No results found for this or any previous visit (from the past 2160 hour(s)).     Musculoskeletal: Strength & Muscle Tone: within normal limits Gait & Station: normal Patient leans: N/A  Mental Status Examination;   Psychiatric Specialty Exam: Physical Exam  Constitutional: She appears well-nourished.  Skin: She is not diaphoretic.    Review  of Systems  Constitutional: Negative for fever.  Cardiovascular: Negative for chest pain and palpitations.  Skin: Negative for rash.  Neurological: Negative for tremors and headaches.  Psychiatric/Behavioral: Negative for depression, hallucinations, substance abuse and suicidal ideas. The patient is nervous/anxious.     There were no vitals taken for this visit.There is no height or  weight on file to calculate BMI.  General Appearance: Casual  Eye Contact::  Fair  Speech:  Slow  Volume:  Decreased  Mood:  Euthymic. Somewhat anxious  Affect:  Restricted  Thought Process:  Coherent  Orientation:  Full (Time, Place, and Person)  Thought Content:  Rumination  Suicidal Thoughts:  No  Homicidal Thoughts:  No  Memory:  Immediate;   Fair Recent;   Fair  Judgement:  Fair  Insight:  Shallow  Psychomotor Activity:  Normal  Concentration:  Fair  Recall:  Fair  Akathisia:  Negative  Handed:  Right  AIMS (if indicated):     Assets:  Social Support Vocational/Educational  Sleep:        Assessment: Axis I: Panic disorder. Possible PTSD. Rule out generalized anxiety disorder. Nicotine dependence  Axis II: Deferred  Axis III:  Past Medical History:  Diagnosis Date  . Acid reflux   . Anxiety   . High blood pressure   . Hypothyroid   . PTSD (post-traumatic stress disorder)     Axis IV: Psychosocial. History of trauma   Treatment Plan and Summary: Continue Klonopin for her panic attacks.  She is not interested to get started on PTSD medication or an SSRI GAD: continue klonopine. Refills given No significant flashbacks.  She has conflicts with her daughter and her husband is sick. This upsets her Feel her anxiety may improve when live by herself. Wants to get involve in therapy to deal with stressors.  Plans to move out and looking forward to that. Nicotine: planning to quit but not ready.   Medication Side effects, benefits and risks reviewed/discussed with Patient. Time given for patient to respond and asks questions regarding the Diagnosis and Medications. Safety concerns and to report to ER if suicidal or call 911.  Greater than 50% of time was spend in counseling and coordination of care with the patient.  Schedule for Follow up visit in 12  weeks or call in earlier as necessary.  Time spent: 25 minutes  Thresa Ross, MD 08/30/2016

## 2016-09-28 ENCOUNTER — Ambulatory Visit (INDEPENDENT_AMBULATORY_CARE_PROVIDER_SITE_OTHER): Payer: 59 | Admitting: Licensed Clinical Social Worker

## 2016-09-28 DIAGNOSIS — F411 Generalized anxiety disorder: Secondary | ICD-10-CM

## 2016-09-28 DIAGNOSIS — F410 Panic disorder [episodic paroxysmal anxiety] without agoraphobia: Secondary | ICD-10-CM

## 2016-09-28 DIAGNOSIS — Z8659 Personal history of other mental and behavioral disorders: Secondary | ICD-10-CM | POA: Diagnosis not present

## 2016-09-29 ENCOUNTER — Encounter (HOSPITAL_COMMUNITY): Payer: Self-pay | Admitting: Licensed Clinical Social Worker
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
## 2016-10-12 ENCOUNTER — Other Ambulatory Visit (HOSPITAL_COMMUNITY): Payer: Self-pay | Admitting: Psychiatry

## 2016-10-13 ENCOUNTER — Ambulatory Visit (HOSPITAL_COMMUNITY): Payer: Self-pay | Admitting: Licensed Clinical Social Worker

## 2016-10-14 NOTE — Telephone Encounter (Signed)
[facility] . Per Dr. Gilmore LarocheAkhtar, medication request is denied. Klonopin rx was last filled on 10/26, refill is not due until 11/23. LVM for pt to contact clinic. Pt f/u apt is schedule on 12/19.

## 2016-10-31 ENCOUNTER — Telehealth (HOSPITAL_COMMUNITY): Payer: Self-pay | Admitting: Psychiatry

## 2016-10-31 MED ORDER — CLONAZEPAM 0.5 MG PO TABS: ORAL_TABLET | ORAL | 0 refills | Status: DC

## 2016-10-31 NOTE — Telephone Encounter (Signed)
Medication refill- pt phoned office requesting a refill for Klonopin. Per Dr. Gilmore LarocheAkhtar, pt is authorize for a refill for Klonopin 0.5mg , #90. [facility], spoke w/ Presida, verbal order given for Klonopin 0.5mg . Pt f/u apt is schedule on 12/19. Called and informed pt of refill status. Pt verbalizes understanding.

## 2016-10-31 NOTE — Telephone Encounter (Signed)
Pt needs refill on klonopin.  Pt is almost out.  Pt aware Gilmore Larocheakhtar is out of office today cb at [phone]

## 2016-11-11 ENCOUNTER — Other Ambulatory Visit (HOSPITAL_COMMUNITY): Payer: Self-pay | Admitting: Psychiatry

## 2016-11-18 NOTE — Telephone Encounter (Signed)
[facility]. Per Dr. Gilmore LarocheAkhtar, refill request is denied. Medication refill was phoned into pharmacy on 11/27. Pt has request refill too early. Pt's f/u apt is schedule on 12/19. lvm for pt to contact office.

## 2016-11-22 ENCOUNTER — Ambulatory Visit (HOSPITAL_COMMUNITY): Payer: Self-pay | Admitting: Psychiatry

## 2016-11-24 ENCOUNTER — Ambulatory Visit (HOSPITAL_COMMUNITY): Payer: Self-pay | Admitting: Psychiatry

## 2016-11-24 ENCOUNTER — Other Ambulatory Visit (HOSPITAL_COMMUNITY): Payer: Self-pay | Admitting: *Deleted

## 2016-11-24 MED ORDER — CLONAZEPAM 0.5 MG PO TABS: ORAL_TABLET | ORAL | 0 refills | Status: DC

## 2016-11-24 NOTE — Telephone Encounter (Signed)
Medication refill- pt contact office requesting a refill for Klonopin. Pt was last seen 08/2016. Medication was last filled on 11/27. Per Dr. Gilmore LarocheAkhtar, refill is authorize for Klonopin 0.5mg , #90. [facility]. Called and informed pt, no more refills will be issued until seen on 1/18. Pt verbalizes understanding.

## 2016-12-19 ENCOUNTER — Ambulatory Visit (INDEPENDENT_AMBULATORY_CARE_PROVIDER_SITE_OTHER): Payer: Medicare Other | Admitting: Physician Assistant

## 2016-12-19 ENCOUNTER — Encounter: Payer: Self-pay | Admitting: Physician Assistant

## 2016-12-19 VITALS — BP 123/64 | HR 71 | Ht 63.0 in | Wt 142.0 lb

## 2016-12-19 DIAGNOSIS — E875 Hyperkalemia: Secondary | ICD-10-CM | POA: Diagnosis not present

## 2016-12-19 DIAGNOSIS — N179 Acute kidney failure, unspecified: Secondary | ICD-10-CM | POA: Insufficient documentation

## 2016-12-19 DIAGNOSIS — E871 Hypo-osmolality and hyponatremia: Secondary | ICD-10-CM | POA: Insufficient documentation

## 2016-12-19 DIAGNOSIS — J432 Centrilobular emphysema: Secondary | ICD-10-CM

## 2016-12-19 DIAGNOSIS — E039 Hypothyroidism, unspecified: Secondary | ICD-10-CM | POA: Diagnosis not present

## 2016-12-19 DIAGNOSIS — D649 Anemia, unspecified: Secondary | ICD-10-CM

## 2016-12-19 NOTE — Progress Notes (Signed)
Subjective:    Patient ID: Claire [name], female    DOB: May 14, 1951, 66 y.o.   MRN: [id number]  HPI  Pt is a 66 yo female who presents to the clinic to establish care. She comes in after ED visit with AKI, hyponatremia, hyperkalemia due to possible prednisone and bactrim adverse reaction.   .. Active Ambulatory Problems    Diagnosis Date Noted  . PTSD (post-traumatic stress disorder) 11/10/2011  . Neurosis, anxiety, panic type 11/10/2011  . Acquired hypothyroidism 11/24/2014  . BP (high blood pressure) 07/24/2012  . Hypothyroid   . Chronic use of benzodiazepine for therapeutic purpose 08/11/2015  . Acute renal failure (HCC) 12/19/2016  . Hyponatremia 12/19/2016  . Hyperkalemia 12/19/2016  . Centrilobular emphysema (HCC) 12/20/2016  . Low hemoglobin 12/20/2016   Resolved Ambulatory Problems    Diagnosis Date Noted  . No Resolved Ambulatory Problems   Past Medical History:  Diagnosis Date  . Acid reflux   . Anxiety   . High blood pressure   . Hypothyroid   . PTSD (post-traumatic stress disorder)    .[name] Family History  Problem Relation Age of Onset  . Anxiety disorder Mother   . OCD Daughter   . Alcohol abuse Father   . Anxiety disorder Sister    .[name] Social History   Social History  . Marital status: Single    Spouse name: N/A  . Number of children: N/A  . Years of education: N/A   Occupational History  . Not on file.   Social History Main Topics  . Smoking status: Current Every Day Smoker    Packs/day: 0.50    Years: 50.00    Types: Cigarettes  . Smokeless tobacco: Never Used  . Alcohol use No  . Drug use: No  . Sexual activity: No   Other Topics Concern  . Not on file   Social History Narrative  . No narrative on file   Pt was diagnosed with URI and treated with 2 rounds of abx. The last round being bactrim and prednisone. On 12/21 she came back to her PCP feeling weak and tired. Her sodium was in the 120's and potassium elevated. She was sent to  the ED. On 12/22 she went to the ED in AKI, hyponatremia at 120, hyperkalemia at 6.3, hypothyroidism at 12, hgb 11.2. With treatment her sodium improved to 130, potassium to 5.4 and serum creatine 1.62.  She then followed up at PCP on 12/29 and labs were drawn but a lab mix up and labs lost. Referral made to Dr. Shirlee Latch, nephrologist.   On 1/3 K-5,8 and SCr 1.31 On 12/16/16 Na-133, K-4.8, SCr- 1.23, TSH 2.28, hgb 9.4  She is feeling much better.   Review of Systems See HPI.     Objective:   Physical Exam  Constitutional: She is oriented to person, place, and time. She appears well-developed and well-nourished.  HENT:  Head: Normocephalic and atraumatic.  Cardiovascular: Normal rate, regular rhythm and normal heart sounds.   Pulmonary/Chest: Effort normal.  Coarse breath sounds bilaterally.   Neurological: She is alert and oriented to person, place, and time.  Psychiatric: She has a normal mood and affect. Her behavior is normal.          Assessment & Plan:  [name] KitchenMarland KitchenDiagnoses and all orders for this visit:  Hyponatremia -     BASIC METABOLIC PANEL WITH GFR  Acute renal failure, unspecified acute renal failure type (HCC) -     BASIC METABOLIC PANEL  WITH GFR  Hypothyroidism, unspecified type  Hyperkalemia -     BASIC METABOLIC PANEL WITH GFR  Centrilobular emphysema (HCC)  Low hemoglobin -     CBC with Differential/Platelet   After viewing labs from 1/12 seems like most everything is improving. Sodium and creatine  not quite back up to normal. Hgb a little low as well and dropping. Could be decreased due to all the blood drawing in hospital etc. Will rechack all labs before nephrologist appt on the 25th. Added bactrim to allergy list. Reassurance given of chance that kidney function will return to previous basline or stay in CKD.

## 2016-12-19 NOTE — Patient Instructions (Signed)
ayr-nasal gel to moisturize nose.  Start back on claritin 10mg  daily.

## 2016-12-20 DIAGNOSIS — J432 Centrilobular emphysema: Secondary | ICD-10-CM | POA: Insufficient documentation

## 2016-12-20 DIAGNOSIS — R710 Precipitous drop in hematocrit: Secondary | ICD-10-CM | POA: Insufficient documentation

## 2016-12-21 ENCOUNTER — Encounter: Payer: Self-pay | Admitting: Physician Assistant

## 2016-12-22 ENCOUNTER — Ambulatory Visit (HOSPITAL_COMMUNITY): Payer: Self-pay | Admitting: Psychiatry

## 2016-12-27 ENCOUNTER — Ambulatory Visit (INDEPENDENT_AMBULATORY_CARE_PROVIDER_SITE_OTHER): Payer: 59 | Admitting: Psychiatry

## 2016-12-27 ENCOUNTER — Encounter (HOSPITAL_COMMUNITY): Payer: Self-pay | Admitting: Psychiatry

## 2016-12-27 DIAGNOSIS — F410 Panic disorder [episodic paroxysmal anxiety] without agoraphobia: Secondary | ICD-10-CM | POA: Diagnosis not present

## 2016-12-27 DIAGNOSIS — Z79899 Other long term (current) drug therapy: Secondary | ICD-10-CM

## 2016-12-27 DIAGNOSIS — F411 Generalized anxiety disorder: Secondary | ICD-10-CM | POA: Diagnosis not present

## 2016-12-27 DIAGNOSIS — F4310 Post-traumatic stress disorder, unspecified: Secondary | ICD-10-CM | POA: Diagnosis not present

## 2016-12-27 DIAGNOSIS — Z888 Allergy status to other drugs, medicaments and biological substances status: Secondary | ICD-10-CM | POA: Diagnosis not present

## 2016-12-27 DIAGNOSIS — Z818 Family history of other mental and behavioral disorders: Secondary | ICD-10-CM

## 2016-12-27 DIAGNOSIS — Z811 Family history of alcohol abuse and dependence: Secondary | ICD-10-CM

## 2016-12-27 LAB — CBC WITH DIFFERENTIAL/PLATELET
BASOS ABS: 0 {cells}/uL (ref 0–200)
Basophils Relative: 0 %
Eosinophils Absolute: 150 cells/uL (ref 15–500)
Eosinophils Relative: 2 %
HEMATOCRIT: 28.3 % — AB (ref 35.0–45.0)
HEMOGLOBIN: 9.4 g/dL — AB (ref 11.7–15.5)
LYMPHS ABS: 2175 {cells}/uL (ref 850–3900)
Lymphocytes Relative: 29 %
MCH: 32.9 pg (ref 27.0–33.0)
MCHC: 33.2 g/dL (ref 32.0–36.0)
MCV: 99 fL (ref 80.0–100.0)
MONO ABS: 825 {cells}/uL (ref 200–950)
MPV: 8.5 fL (ref 7.5–12.5)
Monocytes Relative: 11 %
NEUTROS ABS: 4350 {cells}/uL (ref 1500–7800)
NEUTROS PCT: 58 %
Platelets: 318 10*3/uL (ref 140–400)
RBC: 2.86 MIL/uL — AB (ref 3.80–5.10)
RDW: 13.6 % (ref 11.0–15.0)
WBC: 7.5 10*3/uL (ref 3.8–10.8)

## 2016-12-27 MED ORDER — CLONAZEPAM 0.5 MG PO TABS: ORAL_TABLET | ORAL | 0 refills | Status: DC

## 2016-12-27 NOTE — Progress Notes (Signed)
Patient ID: Claire [name], female   DOB: 04-Jun-1951, 66 y.o.   MRN: [id number]  [facility] Outpatient Follow up visit  Claire [name] [id number] 66 y.o.  12/27/2016 4:40 PM  Chief Complaint:  Establish care for panic attacks  History of Present Illness:   Patient Presents for follow up and medication management for  panic symptoms. Diagnosed with PTSD, panic symptoms and GAD.  She has initially presented with panic and depressive symptoms. She benefits from klonopine. Other medicines including SSRIs tried did not work and caused nausea. Continues to take klonopine. She is followed with a gastroenterologist for irritable bowel.   She feels Klonopin does help she does not want to be on any other medication. She remains somewhat concerned about her daughter talked about when she was growing up. In general her anxiety has not worsened she does worry about her physical health recently diagnosed with hyponatremia and is in the process of getting lab work done and follow-up with her primary care provider to eval her kidney functions  PTSD: childhood summer memories with triggers Infrequent panic attacks.   Medical complexity; irritable bowel.  Husband has prostate cancer. Trying to help him out.  Aggravating factors: stress and crowds. Medical problems  Modifying factors; her grandkid her daughter spends some time with the family.  Medical History; Past Medical History:  Diagnosis Date  . Acid reflux   . Anxiety   . High blood pressure   . Hypothyroid   . PTSD (post-traumatic stress disorder)     Allergies: Allergies  Allergen Reactions  . Macrodantin [Nitrofurantoin Macrocrystal] Rash  . Bactrim [Sulfamethoxazole-Trimethoprim]     ACUTE RENAL fAILURE/HYPONATREMIA/HYPERKALEMIA  . Zithromax [Azithromycin]     Medications: Outpatient Encounter Prescriptions as of 12/27/2016  Medication Sig  . atenolol (TENORMIN) 50 MG tablet Take 50 mg by mouth daily.    .  Budesonide (UCERIS PO) Take by mouth.  . Calcium Carbonate-Vitamin D (CALCIUM-VITAMIN D) 500-200 MG-UNIT per tablet Take 1 tablet by mouth daily.  . clonazePAM (KLONOPIN) 0.5 MG tablet take 1 tablet by mouth three times a day if needed for anxiety  . levothyroxine (SYNTHROID, LEVOTHROID) 50 MCG tablet Take 50 mcg by mouth daily before breakfast.  . loperamide (IMODIUM) 2 MG capsule TAKE 1 TAB BY MOUTH 4 TIMES DAILY AS NEEDED FOR DIARRHEA  . MAGNESIUM PO Take by mouth.  . mometasone-formoterol (DULERA) 100-5 MCG/ACT AERO Inhale 2 puffs into the lungs 2 (two) times daily.  [name] omeprazole (PRILOSEC) 20 MG capsule Take 20 mg by mouth daily.  [name] tiotropium (SPIRIVA) 18 MCG inhalation capsule Place into inhaler and inhale.  . [DISCONTINUED] clonazePAM (KLONOPIN) 0.5 MG tablet take 1 tablet by mouth three times a day if needed for anxiety   No facility-administered encounter medications on file as of 12/27/2016.     Family History; Family History  Problem Relation Age of Onset  . Anxiety disorder Mother   . OCD Daughter   . Alcohol abuse Father   . Anxiety disorder Sister        Labs:  No results found for this or any previous visit (from the past 2160 hour(s)).    Mental Status Examination;   Psychiatric Specialty Exam: Physical Exam  Constitutional: She appears well-nourished.  Skin: She is not diaphoretic.    Review of Systems  Constitutional: Negative for fever.  Gastrointestinal: Negative for nausea.  Skin: Negative for rash.  Neurological: Negative for tremors and headaches.  Psychiatric/Behavioral: Negative for depression. The patient  is nervous/anxious.     There were no vitals taken for this visit.There is no height or weight on file to calculate BMI.  General Appearance: Casual  Eye Contact::  Fair  Speech:  Slow  Volume:  Decreased  Mood:  Somewhat anxious  Affect:  Restricted  Thought Process:  Coherent  Orientation:  Full (Time, Place, and Person)  Thought  Content:  Rumination  Suicidal Thoughts:  No  Homicidal Thoughts:  No  Memory:  Immediate;   Fair Recent;   Fair  Judgement:  Fair  Insight:  Shallow  Psychomotor Activity:  Normal  Concentration:  Fair  Recall:  Fair  Akathisia:  Negative  Handed:  Right  AIMS (if indicated):     Assets:  Social Support Vocational/Educational  Sleep:        Assessment: Axis I: Panic disorder. Possible PTSD. Rule out generalized anxiety disorder. Nicotine dependence  Axis II: Deferred  Axis III:  Past Medical History:  Diagnosis Date  . Acid reflux   . Anxiety   . High blood pressure   . Hypothyroid   . PTSD (post-traumatic stress disorder)     Axis IV: Psychosocial. History of trauma   Treatment Plan and Summary: Continue Klonopin for her panic attacks.   Panic disorder: continue klonopine GAD: klonopine as above PTSD: infrequent flashbacks. Does not want to be on SSRI  FU with primary care with labs and for kidney functions  FU 2-3 months. Prescriptions done with no change.   Thresa RossAKHTAR, Zygmund Passero, MD 12/27/2016

## 2016-12-28 LAB — BASIC METABOLIC PANEL WITH GFR
BUN: 15 mg/dL (ref 7–25)
CALCIUM: 9.3 mg/dL (ref 8.6–10.4)
CHLORIDE: 97 mmol/L — AB (ref 98–110)
CO2: 29 mmol/L (ref 20–31)
CREATININE: 1.46 mg/dL — AB (ref 0.50–0.99)
GFR, Est African American: 43 mL/min — ABNORMAL LOW (ref >=60)
GFR, Est Non African American: 38 mL/min — ABNORMAL LOW (ref >=60)
GLUCOSE: 104 mg/dL — AB (ref 65–99)
Potassium: 4.7 mmol/L (ref 3.5–5.3)
Sodium: 135 mmol/L (ref 135–146)

## 2016-12-28 NOTE — Progress Notes (Signed)
Call pt: hgb still low at 9.4 but stable from last recheck. Do you have iron ferrous sulfate to take.  Kidney function has worsened from last Scr on 1/12 of 1.26. Please send this labs to nephrologist she is seeing later this week.

## 2016-12-29 ENCOUNTER — Other Ambulatory Visit: Payer: Self-pay

## 2016-12-29 MED ORDER — FUSION PLUS PO CAPS: ORAL_CAPSULE | ORAL | 1 refills | Status: DC

## 2017-01-06 ENCOUNTER — Other Ambulatory Visit: Payer: Self-pay | Admitting: *Deleted

## 2017-01-06 MED ORDER — LEVOTHYROXINE SODIUM 50 MCG PO TABS: 50.0000 ug | ORAL_TABLET | Freq: Every day | ORAL | 1 refills | Status: DC

## 2017-01-16 ENCOUNTER — Telehealth: Payer: Self-pay | Admitting: Emergency Medicine

## 2017-01-16 ENCOUNTER — Ambulatory Visit: Payer: Self-pay | Admitting: Physician Assistant

## 2017-01-16 ENCOUNTER — Other Ambulatory Visit: Payer: Self-pay | Admitting: Physician Assistant

## 2017-01-16 DIAGNOSIS — N183 Chronic kidney disease, stage 3 unspecified: Secondary | ICD-10-CM

## 2017-01-16 DIAGNOSIS — N1831 Chronic kidney disease, stage 3a: Secondary | ICD-10-CM | POA: Insufficient documentation

## 2017-01-16 DIAGNOSIS — D509 Iron deficiency anemia, unspecified: Secondary | ICD-10-CM

## 2017-01-16 NOTE — Telephone Encounter (Signed)
Ordered labs. Canceled patient off scheduled.

## 2017-01-16 NOTE — Telephone Encounter (Signed)
Patient called to request phone visit; she has not been able to get influenza vaccination due to illnesses and hopsitalization and is feeling high anxiety over coming to office for today's visit at 3:15pm. [facility] and call her back.

## 2017-01-16 NOTE — Progress Notes (Signed)
Pt called in and did not want to come in due to flu exposure risk. Printed off labs to get drawn today.

## 2017-01-17 ENCOUNTER — Other Ambulatory Visit: Payer: Self-pay

## 2017-01-17 LAB — BASIC METABOLIC PANEL WITH GFR
BUN: 12 mg/dL (ref 7–25)
CALCIUM: 9.4 mg/dL (ref 8.6–10.4)
CO2: 30 mmol/L (ref 20–31)
CREATININE: 1.28 mg/dL — AB (ref 0.50–0.99)
Chloride: 98 mmol/L (ref 98–110)
GFR, EST AFRICAN AMERICAN: 50 mL/min — AB (ref >=60)
GFR, EST NON AFRICAN AMERICAN: 44 mL/min — AB (ref >=60)
GLUCOSE: 90 mg/dL (ref 65–99)
Potassium: 4.4 mmol/L (ref 3.5–5.3)
Sodium: 138 mmol/L (ref 135–146)

## 2017-01-17 LAB — CBC WITH DIFFERENTIAL/PLATELET
BASOS ABS: 67 {cells}/uL (ref 0–200)
Basophils Relative: 1 %
EOS ABS: 268 {cells}/uL (ref 15–500)
EOS PCT: 4 %
HCT: 30 % — ABNORMAL LOW (ref 35.0–45.0)
Hemoglobin: 9.9 g/dL — ABNORMAL LOW (ref 11.7–15.5)
LYMPHS PCT: 26 %
Lymphs Abs: 1742 cells/uL (ref 850–3900)
MCH: 32.9 pg (ref 27.0–33.0)
MCHC: 33 g/dL (ref 32.0–36.0)
MCV: 99.7 fL (ref 80.0–100.0)
MONOS PCT: 9 %
MPV: 8.7 fL (ref 7.5–12.5)
Monocytes Absolute: 603 cells/uL (ref 200–950)
NEUTROS ABS: 4020 {cells}/uL (ref 1500–7800)
NEUTROS PCT: 60 %
PLATELETS: 374 10*3/uL (ref 140–400)
RBC: 3.01 MIL/uL — ABNORMAL LOW (ref 3.80–5.10)
RDW: 13.1 % (ref 11.0–15.0)
WBC: 6.7 10*3/uL (ref 3.8–10.8)

## 2017-01-23 ENCOUNTER — Other Ambulatory Visit (HOSPITAL_COMMUNITY): Payer: Self-pay | Admitting: Psychiatry

## 2017-01-24 ENCOUNTER — Telehealth: Payer: Self-pay

## 2017-01-24 ENCOUNTER — Other Ambulatory Visit: Payer: Self-pay | Admitting: Physician Assistant

## 2017-01-24 MED ORDER — ALBUTEROL SULFATE HFA 108 (90 BASE) MCG/ACT IN AERS: 2.0000 | INHALATION_SPRAY | Freq: Four times a day (QID) | RESPIRATORY_TRACT | 11 refills | Status: DC | PRN

## 2017-01-24 NOTE — Telephone Encounter (Signed)
Pt called and wants to know if she can get a referral to the nutritionist? Pt states she is on a low potassium diet and she is having a hard time knowing what she can eat. Pt also wants to know if she can get a refill for her proair inhaler? Proair is not listed under her medications. She also wants to know if she can get a prescription for a nebulizer machine for home?

## 2017-01-24 NOTE — Telephone Encounter (Signed)
I went ahead and sent proair.  Ok for nutrition referral.  How often are you using proair inhaler?

## 2017-01-25 ENCOUNTER — Other Ambulatory Visit: Payer: Self-pay

## 2017-01-25 ENCOUNTER — Other Ambulatory Visit: Payer: Self-pay | Admitting: Physician Assistant

## 2017-01-25 DIAGNOSIS — N179 Acute kidney failure, unspecified: Secondary | ICD-10-CM

## 2017-01-25 MED ORDER — ALBUTEROL SULFATE (2.5 MG/3ML) 0.083% IN NEBU: 2.5000 mg | INHALATION_SOLUTION | RESPIRATORY_TRACT | 6 refills | Status: DC | PRN

## 2017-01-25 NOTE — Telephone Encounter (Signed)
Rx for Nebul[facility] to [facility]. Nutrition Referral sent. Pt aware that Rx Proair sent to pharmacy.

## 2017-01-25 NOTE — Telephone Encounter (Signed)
Pt states she is averaging at 3 x a day on proair. Pt wants to know if she can get a nebulizer machine?

## 2017-01-25 NOTE — Telephone Encounter (Signed)
LVM requesting pt to call the office.  

## 2017-01-25 NOTE — Telephone Encounter (Signed)
Ok for nebulizer to order. I sent solution to pharmacy.

## 2017-01-25 NOTE — Progress Notes (Unsigned)
Pt wants help with low potassium diet.

## 2017-01-30 NOTE — Telephone Encounter (Signed)
[facility] Klonopin. Dr. Gilmore LarocheAkhtar, refill request for Klonopin 0.5mg , #90 is authorize. Verbal order was given to pharmacy for Klonopin 0.5mg , #90. Pharmacy will notify pt when prescription is ready for pickup. Pt's next apt is schedule on 02/16/17.

## 2017-02-01 ENCOUNTER — Telehealth: Payer: Self-pay

## 2017-02-01 NOTE — Telephone Encounter (Signed)
[facility]? If so ok to send.

## 2017-02-01 NOTE — Telephone Encounter (Signed)
Claire [name] called and states the Spiriva Handihaler always has blue plastic particles in the Handihaler from the puncture device. She is worred these particles are entering her lungs. She would like to switch to a different inhaler. Please advise.

## 2017-02-02 ENCOUNTER — Other Ambulatory Visit: Payer: Self-pay | Admitting: Physician Assistant

## 2017-02-02 MED ORDER — UMECLIDINIUM BROMIDE 62.5 MCG/INH IN AEPB: 1.0000 | INHALATION_SPRAY | Freq: Every day | RESPIRATORY_TRACT | 5 refills | Status: DC

## 2017-02-02 NOTE — Telephone Encounter (Signed)
I'm not sure what I would send. She is already taking ventolin HFA inhaler as needed. She would like a alternative to Spiriva.

## 2017-02-02 NOTE — Telephone Encounter (Signed)
Sent incruse to see if insurance will pay for.

## 2017-02-03 NOTE — Telephone Encounter (Signed)
Patient states she is going to stop the Spiriva and start taking the Dulera bid instead of once daily. Jade agreed.

## 2017-02-03 NOTE — Telephone Encounter (Signed)
Claire [name] would rather switch to Advair. She states the Incruse has a contradiction for patient with weak urine stream and kidney problems. Please advise.

## 2017-02-03 NOTE — Telephone Encounter (Signed)
Have you been on advair before? spriva is not the same as advair and NOT the first line for COPD. If she has been on before can send over.

## 2017-02-15 ENCOUNTER — Telehealth: Payer: Self-pay

## 2017-02-16 ENCOUNTER — Ambulatory Visit (INDEPENDENT_AMBULATORY_CARE_PROVIDER_SITE_OTHER): Payer: 59 | Admitting: Psychiatry

## 2017-02-16 ENCOUNTER — Encounter (HOSPITAL_COMMUNITY): Payer: Self-pay | Admitting: Psychiatry

## 2017-02-16 VITALS — Ht 63.0 in

## 2017-02-16 DIAGNOSIS — F411 Generalized anxiety disorder: Secondary | ICD-10-CM

## 2017-02-16 DIAGNOSIS — Z818 Family history of other mental and behavioral disorders: Secondary | ICD-10-CM | POA: Diagnosis not present

## 2017-02-16 DIAGNOSIS — F410 Panic disorder [episodic paroxysmal anxiety] without agoraphobia: Secondary | ICD-10-CM

## 2017-02-16 DIAGNOSIS — Z811 Family history of alcohol abuse and dependence: Secondary | ICD-10-CM

## 2017-02-16 DIAGNOSIS — F4310 Post-traumatic stress disorder, unspecified: Secondary | ICD-10-CM

## 2017-02-16 DIAGNOSIS — Z79899 Other long term (current) drug therapy: Secondary | ICD-10-CM

## 2017-02-16 MED ORDER — CLONAZEPAM 0.5 MG PO TABS: ORAL_TABLET | ORAL | 0 refills | Status: DC

## 2017-02-16 NOTE — Progress Notes (Signed)
Patient ID: Claire [name], female   DOB: Feb 23, 1951, 66 y.o.   MRN: [id number]  [facility] visit  TOMA ERICHSEN [id number] 66 y.o.  02/16/2017 3:39 PM  Chief Complaint:  Establish care for panic attacks  History of Present Illness:   Patient Presents for follow up and medication management for  panic symptoms. Diagnosed with PTSD, panic symptoms and GAD.   Patient has had a difficult time around the summertime when she got sick and was given antibiotic. She has had developed  acute renal injury or toxicity and was admitted in the hospital her labs were monitored. She is following with the nephrologist and also primary care physician she was anxious about her physical health otherwise she does not want to add any other medication for anxiety or depression. She keeps her mouth covered to protect herself she remains somewhat guarded but does not endorse hallucinations  PTSD is relevant to her past but not having significant flashbacks she does have triggers that remind her and exacerbates anxiety.     Aggravating factors: husband has prostate cancer. Medical problems  Modifying factors; her grandkid her daughter spends some time with the family.  Medical History; Past Medical History:  Diagnosis Date  . Acid reflux   . Anxiety   . High blood pressure   . Hypothyroid   . PTSD (post-traumatic stress disorder)     Allergies: Allergies  Allergen Reactions  . Macrodantin [Nitrofurantoin Macrocrystal] Rash  . Bactrim [Sulfamethoxazole-Trimethoprim]     ACUTE RENAL fAILURE/HYPONATREMIA/HYPERKALEMIA  . Zithromax [Azithromycin]     Medications: Outpatient Encounter Prescriptions as of 02/16/2017  Medication Sig  . albuterol (PROVENTIL HFA;VENTOLIN HFA) 108 (90 Base) MCG/ACT inhaler Inhale 2 puffs into the lungs every 6 (six) hours as needed for wheezing.  [name] albuterol (PROVENTIL) (2.5 MG/3ML) 0.083% nebulizer solution Take 3 mLs (2.5 mg total) by  nebulization every 4 (four) hours as needed for wheezing or shortness of breath (please include nebulizer machine, hoses, and mask if needed.).  [name] atenolol (TENORMIN) 50 MG tablet Take 50 mg by mouth daily.    . Budesonide (UCERIS PO) Take by mouth.  . Calcium Carbonate-Vitamin D (CALCIUM-VITAMIN D) 500-200 MG-UNIT per tablet Take 1 tablet by mouth daily.  . clonazePAM (KLONOPIN) 0.5 MG tablet take 1 tablet by mouth three times a day if needed  . levothyroxine (SYNTHROID, LEVOTHROID) 50 MCG tablet Take 1 tablet (50 mcg total) by mouth daily before breakfast.  . loperamide (IMODIUM) 2 MG capsule TAKE 1 TAB BY MOUTH 4 TIMES DAILY AS NEEDED FOR DIARRHEA  . MAGNESIUM PO Take by mouth.  . mometasone-formoterol (DULERA) 100-5 MCG/ACT AERO Inhale 2 puffs into the lungs 2 (two) times daily.  [name] omeprazole (PRILOSEC) 20 MG capsule Take 20 mg by mouth daily.  [name] tiotropium (SPIRIVA) 18 MCG inhalation capsule Place into inhaler and inhale.  . umeclidinium bromide (INCRUSE ELLIPTA) 62.5 MCG/INH AEPB Inhale 1 puff into the lungs daily.  . [DISCONTINUED] clonazePAM (KLONOPIN) 0.5 MG tablet take 1 tablet by mouth three times a day if needed   No facility-administered encounter medications on file as of 02/16/2017.     Family History; Family History  Problem Relation Age of Onset  . Anxiety disorder Mother   . OCD Daughter   . Alcohol abuse Father   . Anxiety disorder Sister        Labs:  Recent Results (from the past 2160 hour(s))  BASIC METABOLIC PANEL WITH GFR  Status: Abnormal   Collection Time: 12/27/16  4:00 PM  Result Value Ref Range   Sodium 135 135 - 146 mmol/L   Potassium 4.7 3.5 - 5.3 mmol/L   Chloride 97 (L) 98 - 110 mmol/L   CO2 29 20 - 31 mmol/L   Glucose, Bld 104 (H) 65 - 99 mg/dL   BUN 15 7 - 25 mg/dL   Creat 1.46 (H) 0.50 - 0.99 mg/dL    Comment:   For patients > or = 66 years of age: The upper reference limit for Creatinine is approximately 13% higher for people  identified as African-American.      Calcium 9.3 8.6 - 10.4 mg/dL   GFR, Est African American 43 (L) >=60 mL/min   GFR, Est Non African American 38 (L) >=60 mL/min  CBC with Differential/Platelet     Status: Abnormal   Collection Time: 12/27/16  4:02 PM  Result Value Ref Range   WBC 7.5 3.8 - 10.8 K/uL   RBC 2.86 (L) 3.80 - 5.10 MIL/uL   Hemoglobin 9.4 (L) 11.7 - 15.5 g/dL   HCT 28.3 (L) 35.0 - 45.0 %   MCV 99.0 80.0 - 100.0 fL   MCH 32.9 27.0 - 33.0 pg   MCHC 33.2 32.0 - 36.0 g/dL   RDW 13.6 11.0 - 15.0 %   Platelets 318 140 - 400 K/uL   MPV 8.5 7.5 - 12.5 fL   Neutro Abs 4,350 1,500 - 7,800 cells/uL   Lymphs Abs 2,175 850 - 3,900 cells/uL   Monocytes Absolute 825 200 - 950 cells/uL   Eosinophils Absolute 150 15 - 500 cells/uL   Basophils Absolute 0 0 - 200 cells/uL   Neutrophils Relative % 58 %   Lymphocytes Relative 29 %   Monocytes Relative 11 %   Eosinophils Relative 2 %   Basophils Relative 0 %   Smear Review Criteria for review not met   BASIC METABOLIC PANEL WITH GFR     Status: Abnormal   Collection Time: 01/16/17  3:34 PM  Result Value Ref Range   Sodium 138 135 - 146 mmol/L   Potassium 4.4 3.5 - 5.3 mmol/L   Chloride 98 98 - 110 mmol/L   CO2 30 20 - 31 mmol/L   Glucose, Bld 90 65 - 99 mg/dL   BUN 12 7 - 25 mg/dL   Creat 1.28 (H) 0.50 - 0.99 mg/dL    Comment:   For patients > or = 66 years of age: The upper reference limit for Creatinine is approximately 13% higher for people identified as African-American.      Calcium 9.4 8.6 - 10.4 mg/dL   GFR, Est African American 50 (L) >=60 mL/min   GFR, Est Non African American 44 (L) >=60 mL/min  CBC with Differential/Platelet     Status: Abnormal   Collection Time: 01/16/17  3:36 PM  Result Value Ref Range   WBC 6.7 3.8 - 10.8 K/uL   RBC 3.01 (L) 3.80 - 5.10 MIL/uL   Hemoglobin 9.9 (L) 11.7 - 15.5 g/dL   HCT 30.0 (L) 35.0 - 45.0 %   MCV 99.7 80.0 - 100.0 fL   MCH 32.9 27.0 - 33.0 pg   MCHC 33.0 32.0 - 36.0  g/dL   RDW 13.1 11.0 - 15.0 %   Platelets 374 140 - 400 K/uL   MPV 8.7 7.5 - 12.5 fL   Neutro Abs 4,020 1,500 - 7,800 cells/uL   Lymphs Abs 1,742 850 - 3,900 cells/uL  Monocytes Absolute 603 200 - 950 cells/uL   Eosinophils Absolute 268 15 - 500 cells/uL   Basophils Absolute 67 0 - 200 cells/uL   Neutrophils Relative % 60 %   Lymphocytes Relative 26 %   Monocytes Relative 9 %   Eosinophils Relative 4 %   Basophils Relative 1 %   Smear Review Criteria for review not met       Mental Status Examination;   Psychiatric Specialty Exam: Physical Exam  Constitutional: She appears well-nourished.  Skin: She is not diaphoretic.    Review of Systems  Constitutional: Negative for fever.  Cardiovascular: Negative for chest pain.  Gastrointestinal: Negative for nausea.  Skin: Negative for rash.  Neurological: Negative for tremors and headaches.  Psychiatric/Behavioral: Negative for depression.    Height 5' 3"  (1.6 m).There is no height or weight on file to calculate BMI.  General Appearance: Casual  Eye Contact::  Fair  Speech:  Slow  Volume:  Decreased  Mood:  worriful   Affect:  congruent  Thought Process:  Coherent and intact  Orientation:  Full (Time, Place, and Person)  Thought Content:  Rumination  Suicidal Thoughts:  No  Homicidal Thoughts:  No  Memory:  Immediate;   Fair Recent;   Fair  Judgement:  Fair  Insight:  Shallow  Psychomotor Activity:  Normal  Concentration:  Fair  Recall:  Fair  Akathisia:  Negative  Handed:  Right  AIMS (if indicated):     Assets:  Social Support Vocational/Educational  Sleep:        Assessment: Axis I: Panic disorder. Possible PTSD. Rule out generalized anxiety disorder. Nicotine dependence  Axis II: Deferred  Axis III:  Past Medical History:  Diagnosis Date  . Acid reflux   . Anxiety   . High blood pressure   . Hypothyroid   . PTSD (post-traumatic stress disorder)     Axis IV: Psychosocial. History of  trauma   Treatment Plan and Summary:  Continue Klonopin she is advised to cut down the dose considering if she is developing some renal concerns the lower dose of it beneficial but she remains reluctant to want to continue 3 times a day she understands to call fatigue and memory issues as with time.  For PTSD, panic attacks and anxiety continue Klonopin as stated above.  klonopine printed   More than 50% of 25 minutes spent in counseling and coordination of care including patient education and review side effects and also her concerns ,provided supportive  therapy  and reminded her about to keep follow-up appointments as scheduled  FU 3 months.   Merian Capron, MD 02/16/2017

## 2017-02-21 LAB — BASIC METABOLIC PANEL
BUN: 17 mg/dL (ref 4–21)
CALCIUM: 9.6 mg/dL
CREATININE: 1.3 mg/dL — AB (ref 0.5–1.1)
Carbon Dioxide, Total: 30
Chloride: 97 mmol/L
EGFR (Non-African Amer.): 43
GFR CALC AF AMER: 50
Glucose: 89 mg/dL
POTASSIUM: 4.3 mmol/L (ref 3.4–5.3)
Sodium: 136 mmol/L — AB (ref 137–147)

## 2017-02-27 ENCOUNTER — Telehealth: Payer: Self-pay | Admitting: Emergency Medicine

## 2017-02-27 NOTE — Telephone Encounter (Signed)
50 mg was what we had on file was her current dose. I just refilled it. What was dose she last got filled? We can call pharmacy if needed. She is a new patient.

## 2017-02-27 NOTE — Telephone Encounter (Signed)
Patient calling with question about dose for levothyroxin, which she will refill today. I told her the order from 01/06/17 is for 50mcg but she says this is different than previously and wants to make certain. Hoping for call back today.

## 2017-02-28 NOTE — Telephone Encounter (Signed)
I have seen labs in care everywhere but not received any. Ok to send whatever dose she was previously on for levothyroxine and recheck in 6 weeks.

## 2017-02-28 NOTE — Telephone Encounter (Signed)
[facility] has [facility] had [facility] filled for 75 mcg.  Please advise

## 2017-02-28 NOTE — Telephone Encounter (Signed)
Pt was out of levothyroxine, so she had 50 mcg dosage, so she took 1.5 pills of that.  She wanted to see if you all had received her labs, according to her they came to the fax at your desk.

## 2017-03-02 ENCOUNTER — Other Ambulatory Visit: Payer: Self-pay

## 2017-03-02 MED ORDER — LEVOTHYROXINE SODIUM 75 MCG PO TABS: 75.0000 ug | ORAL_TABLET | Freq: Every day | ORAL | 1 refills | Status: DC

## 2017-03-02 NOTE — Telephone Encounter (Signed)
[facility] 75 mcg.

## 2017-03-15 ENCOUNTER — Encounter: Payer: Self-pay | Admitting: Physician Assistant

## 2017-03-15 ENCOUNTER — Ambulatory Visit (INDEPENDENT_AMBULATORY_CARE_PROVIDER_SITE_OTHER): Payer: Medicare Other | Admitting: Physician Assistant

## 2017-03-15 VITALS — BP 131/78 | HR 73 | Wt 140.0 lb

## 2017-03-15 DIAGNOSIS — Z13220 Encounter for screening for lipoid disorders: Secondary | ICD-10-CM | POA: Diagnosis not present

## 2017-03-15 DIAGNOSIS — R82998 Other abnormal findings in urine: Secondary | ICD-10-CM

## 2017-03-15 DIAGNOSIS — D649 Anemia, unspecified: Secondary | ICD-10-CM | POA: Diagnosis not present

## 2017-03-15 DIAGNOSIS — N183 Chronic kidney disease, stage 3 unspecified: Secondary | ICD-10-CM

## 2017-03-15 DIAGNOSIS — N179 Acute kidney failure, unspecified: Secondary | ICD-10-CM

## 2017-03-15 DIAGNOSIS — R8299 Other abnormal findings in urine: Secondary | ICD-10-CM

## 2017-03-15 DIAGNOSIS — J432 Centrilobular emphysema: Secondary | ICD-10-CM

## 2017-03-15 DIAGNOSIS — R350 Frequency of micturition: Secondary | ICD-10-CM

## 2017-03-15 DIAGNOSIS — D509 Iron deficiency anemia, unspecified: Secondary | ICD-10-CM

## 2017-03-15 LAB — POCT URINALYSIS DIPSTICK
Bilirubin, UA: NEGATIVE
GLUCOSE UA: NEGATIVE
Ketones, UA: NEGATIVE
NITRITE UA: NEGATIVE
PROTEIN UA: NEGATIVE
RBC UA: NEGATIVE
Spec Grav, UA: 1.01 (ref 1.010–1.025)
UROBILINOGEN UA: 0.2 U/dL
pH, UA: 5.5 (ref 5.0–8.0)

## 2017-03-15 LAB — POCT HEMOGLOBIN: Hemoglobin: 11.4 g/dL — AB (ref 12.2–16.2)

## 2017-03-15 MED ORDER — MOMETASONE FURO-FORMOTEROL FUM 100-5 MCG/ACT IN AERO: 2.0000 | INHALATION_SPRAY | Freq: Two times a day (BID) | RESPIRATORY_TRACT | 5 refills | Status: DC

## 2017-03-15 NOTE — Progress Notes (Signed)
   Subjective:    Patient ID: Claire [name], female    DOB: [birth year], 66 y.o.   MRN: [id number]  HPI  Pt is a 66 yo female who presents to the clinic for follow up.   Her last hgb was 9.9 and she would like rechecked now that she has started oral iron. She has mild CKD 3 and being managed by Dr. Shirlee Latch. She follow up with him in July.    She is having some urinary frequency for the last week. No pain, fever, chills, n/v. She wants to make sure not infection.   Doing well on dulera would like refill. No problems or concerns.    Review of Systems  All other systems reviewed and are negative.      Objective:   Physical Exam  Constitutional: She is oriented to person, place, and time. She appears well-developed and well-nourished.  HENT:  Head: Atraumatic.  Cardiovascular: Normal rate, regular rhythm and normal heart sounds.   Pulmonary/Chest: Effort normal and breath sounds normal.  No CVA tenderness.   Abdominal: Soft. Bowel sounds are normal. She exhibits no distension and no mass. There is no tenderness. There is no rebound and no guarding.  Neurological: She is alert and oriented to person, place, and time.  Psychiatric: She has a normal mood and affect. Her behavior is normal.          Assessment & Plan:  [name] KitchenMarland KitchenMiguelina was seen today for f/u hemoglobin.  Diagnoses and all orders for this visit:  Low hemoglobin -     POCT hemoglobin  Acute kidney injury (HCC) -     Basic Metabolic Panel (BMET)  CKD (chronic kidney disease) stage 3, GFR 30-59 ml/min  Centrilobular emphysema (HCC) -     mometasone-formoterol (DULERA) 100-5 MCG/ACT AERO; Inhale 2 puffs into the lungs 2 (two) times daily.  Screening for lipid disorders -     Lipid panel  Iron deficiency anemia, unspecified iron deficiency anemia type  Urinary frequency -     Urine Culture -     POCT urinalysis dipstick  Leukocytes in urine -     Urine Culture      .[name] Results for orders placed or performed  in visit on 03/15/17  POCT hemoglobin  Result Value Ref Range   Hemoglobin 11.4 (A) 12.2 - 16.2 g/dL  POCT urinalysis dipstick  Result Value Ref Range   Color, UA yellow    Clarity, UA clear    Glucose, UA neg    Bilirubin, UA neg    Ketones, UA neg    Spec Grav, UA 1.010 1.010 - 1.025   Blood, UA neg    pH, UA 5.5 5.0 - 8.0   Protein, UA neg    Urobilinogen, UA 0.2 0.2 or 1.0 E.U./dL   Nitrite, UA neg    Leukocytes, UA Small (1+) (A) Negative   Will culture urine.  Great improvement in hgb.  Refilled dulera.  Will recheck bmp.

## 2017-03-17 ENCOUNTER — Other Ambulatory Visit: Payer: Self-pay | Admitting: *Deleted

## 2017-03-17 DIAGNOSIS — N179 Acute kidney failure, unspecified: Secondary | ICD-10-CM | POA: Insufficient documentation

## 2017-03-17 LAB — URINE CULTURE

## 2017-03-17 MED ORDER — CIPROFLOXACIN HCL 500 MG PO TABS: 500.0000 mg | ORAL_TABLET | Freq: Two times a day (BID) | ORAL | 0 refills | Status: DC

## 2017-03-23 ENCOUNTER — Encounter: Payer: Self-pay | Admitting: Physician Assistant

## 2017-03-24 LAB — BASIC METABOLIC PANEL
BUN: 11 mg/dL (ref 7–25)
CALCIUM: 9.2 mg/dL (ref 8.6–10.4)
CO2: 27 mmol/L (ref 20–31)
CREATININE: 1.2 mg/dL — AB (ref 0.50–0.99)
Chloride: 100 mmol/L (ref 98–110)
GLUCOSE: 97 mg/dL (ref 65–99)
Potassium: 4.2 mmol/L (ref 3.5–5.3)
Sodium: 138 mmol/L (ref 135–146)

## 2017-03-24 NOTE — Progress Notes (Signed)
Creatine continues to improve from 2 months ago.

## 2017-03-27 ENCOUNTER — Encounter: Payer: Self-pay | Admitting: Physician Assistant

## 2017-03-29 ENCOUNTER — Other Ambulatory Visit: Payer: Self-pay | Admitting: Physician Assistant

## 2017-04-05 ENCOUNTER — Other Ambulatory Visit (HOSPITAL_COMMUNITY): Payer: Self-pay | Admitting: *Deleted

## 2017-04-05 MED ORDER — CLONAZEPAM 0.5 MG PO TABS: ORAL_TABLET | ORAL | 0 refills | Status: DC

## 2017-04-05 NOTE — Telephone Encounter (Signed)
Received fax from [facility] request refill for Klonopin. Dr. Gilmore Laroche, refill request for Klonopin 0.5mg , #90 is authorize. Verbal order was given to pharmacy for Klonopin 0.5mg , #90. Pharmacy will notify pt when prescription is ready for pickup. Pt's next apt is schedule on 05/11/2017. Pt notified.

## 2017-04-11 LAB — BASIC METABOLIC PANEL
BUN: 10 mg/dL (ref 4–21)
CREATININE: 1.2 mg/dL — AB (ref 0.5–1.1)
GLUCOSE: 100 mg/dL
Potassium: 4.5 mmol/L (ref 3.4–5.3)
SODIUM: 135 mmol/L — AB (ref 137–147)

## 2017-04-11 LAB — CBC AND DIFFERENTIAL
HCT: 29 % — AB (ref 36–46)
Hemoglobin: 10.4 g/dL — AB (ref 12.0–16.0)
PLATELETS: 333 10*3/uL (ref 150–399)
WBC: 6.2 10^3/mL

## 2017-04-13 ENCOUNTER — Telehealth: Payer: Self-pay

## 2017-04-13 NOTE — Telephone Encounter (Signed)
Call pt: did they do fingerstick or blood draw? make sure you are increasing iron rich foods. It is not dangerously low but we do not want to see continue to drop. Are you having any abdominal pain/blood in stool/epigastric pain?  Recheck hemoglobin via blood in one month.

## 2017-04-13 NOTE — Telephone Encounter (Signed)
They did a blood draw from her arm.  Here in April it was a fingerstick.   Pt stated that she is taking calcium 2-600 mg twice a day.  She tries to have 2-3 hours between the iron and the calcium. Denies any pain or blood in her stool.

## 2017-04-13 NOTE — Telephone Encounter (Signed)
Pt called and stated that a month ago her Hemoglobin was 11.4 a month ago, Tuesday at Dr Alford HighlandMcLean's office it was 10.4.  Pt wanted to know if there is a reason to be concerned.  Please advise.

## 2017-04-13 NOTE — Telephone Encounter (Signed)
Pt is concerned about having 7 teeth pulled on May 24.  She wanted to know if you thought that was okay.

## 2017-04-14 NOTE — Telephone Encounter (Signed)
Let's check hemoglobin via blood before her dentist appt at this point I think it is ok as blood loss is minimal with dental work. Come in 2 days before procedure for draw.

## 2017-04-15 ENCOUNTER — Other Ambulatory Visit: Payer: Self-pay | Admitting: Physician Assistant

## 2017-04-18 ENCOUNTER — Ambulatory Visit (INDEPENDENT_AMBULATORY_CARE_PROVIDER_SITE_OTHER): Payer: Medicare Other | Admitting: Physician Assistant

## 2017-04-18 VITALS — BP 134/88 | HR 66 | Resp 16

## 2017-04-18 DIAGNOSIS — D508 Other iron deficiency anemias: Secondary | ICD-10-CM

## 2017-04-18 LAB — POCT HEMOGLOBIN: Hemoglobin: 10.3 g/dL — AB (ref 12.2–16.2)

## 2017-04-18 NOTE — Progress Notes (Signed)
   Subjective:    Patient ID: Claire [name], female    DOB: May 05, 1951, 66 y.o.   MRN: [id number]  HPI Patient here requesting hemoglobin level. Is taking slow iron once a day. Has done cologuard tests x 3 with negative results.    Review of Systems     Objective:   Physical Exam  10.3      Assessment & Plan:  Informed patient she can take slow iron twice a day; should take with VitC beverage to enhance absorption. Told her to make appt. If she has questions and concerns. pk  Ok for dental procedure. Increase iron recheck in 1 month. Tandy GawJade Breeback PAC

## 2017-04-20 NOTE — Telephone Encounter (Signed)
Dental procedure has been moved to June 1st.  I told her to have hemoglobin drawn on May 30th.

## 2017-04-20 NOTE — Telephone Encounter (Signed)
Called and left a detailed message for patient to come in next Tuesday 5-22 and have her blood drawn.  Told to call the office if any questions.

## 2017-04-20 NOTE — Telephone Encounter (Signed)
ok 

## 2017-04-21 NOTE — Telephone Encounter (Signed)
Call pt: [facility] suggested if oral iron isn't bring up hgb to stop. Are you still taking oral iron? They have diagnosed as chronic anemia. She does not think there are any pathways of blood loss.

## 2017-04-24 ENCOUNTER — Telehealth: Payer: Self-pay | Admitting: Physician Assistant

## 2017-04-24 ENCOUNTER — Encounter: Payer: Self-pay | Admitting: Physician Assistant

## 2017-04-24 NOTE — Telephone Encounter (Signed)
Pt wanted to make sure PCP was aware of the OV notes from her GI Physician. She went to her appt on 04/20/17, information available in care everywhere.

## 2017-04-24 NOTE — Telephone Encounter (Signed)
I am aware. She can stop iron via specialist.

## 2017-04-25 NOTE — Telephone Encounter (Signed)
It will not hurt you to take iron daily. If you want to start back taking daily and recheck in 1 month ok to do so. If anemia is due to kidney disease then it will not help.

## 2017-04-25 NOTE — Telephone Encounter (Signed)
Call pt: she can continue if she wants to but it appears it is not doing her any good. She can get it checked the day before:however anemia due to chronic disease would not be effected by dental procedure.

## 2017-04-25 NOTE — Telephone Encounter (Signed)
Pt advised. She is concered about stopping the iron. She also states her dentist appt is on 05/05/17, she will have to have about 7 teeth removed/fixed. Questions if she needs to get blood work done again prior to appt. Routing for review.

## 2017-04-25 NOTE — Telephone Encounter (Signed)
Spoke with patient and advised of recommendations for the iron and labs. She states she is very upset about the fact she has anemia due to her kidney disease. She then reported she has been taking the iron every other day because the pharmacist stated it is adsorbed better when taking every other day. This could have been why her levels were down. She can not remember exactly when she stopped taking iron daily. Please advise.

## 2017-04-26 NOTE — Telephone Encounter (Signed)
Patient advised of recommendations.  

## 2017-05-05 ENCOUNTER — Other Ambulatory Visit (HOSPITAL_COMMUNITY): Payer: Self-pay | Admitting: *Deleted

## 2017-05-05 MED ORDER — CLONAZEPAM 0.5 MG PO TABS: ORAL_TABLET | ORAL | 0 refills | Status: DC

## 2017-05-05 NOTE — Addendum Note (Signed)
Addended by: Anice PaganiniBRACAMONTES, Tyrae Alcoser T on: 05/05/2017 10:36 AM   Modules accepted: Orders

## 2017-05-05 NOTE — Telephone Encounter (Signed)
Patient contacted office requesting a refill for Klonopin. Medication was last filled on 04/05/17. Patient is schedule for an apt on 05/11/17. Please advise.

## 2017-05-05 NOTE — Telephone Encounter (Signed)
If she can not wait till appointment. Refill can be sent.

## 2017-05-05 NOTE — Telephone Encounter (Signed)
Medication refill- patient called office requesting a refill for Klonopin. Dr. Gilmore LarocheAkhtar, refill request for Klonopin 0.5mg , #90 is authorize. Verbal order was given to pharmacy for Klonopin 0.5mg , #90. Pharmacy will notify pt when prescription is ready for pickup. Patient's next apt is schedule on 05/11/2017. Patient notified.

## 2017-05-09 ENCOUNTER — Other Ambulatory Visit: Payer: Self-pay

## 2017-05-09 MED ORDER — ALBUTEROL SULFATE HFA 108 (90 BASE) MCG/ACT IN AERS: 2.0000 | INHALATION_SPRAY | RESPIRATORY_TRACT | 11 refills | Status: DC | PRN

## 2017-05-10 ENCOUNTER — Ambulatory Visit: Payer: Self-pay

## 2017-05-10 ENCOUNTER — Other Ambulatory Visit: Payer: Self-pay

## 2017-05-10 DIAGNOSIS — R7989 Other specified abnormal findings of blood chemistry: Secondary | ICD-10-CM

## 2017-05-10 DIAGNOSIS — D649 Anemia, unspecified: Secondary | ICD-10-CM

## 2017-05-10 LAB — BASIC METABOLIC PANEL
BUN: 11 mg/dL (ref 7–25)
CHLORIDE: 98 mmol/L (ref 98–110)
CO2: 30 mmol/L (ref 20–31)
CREATININE: 1.19 mg/dL — AB (ref 0.50–0.99)
Calcium: 9.2 mg/dL (ref 8.6–10.4)
Glucose, Bld: 90 mg/dL (ref 65–99)
Potassium: 4.5 mmol/L (ref 3.5–5.3)
SODIUM: 137 mmol/L (ref 135–146)

## 2017-05-10 LAB — CBC WITH DIFFERENTIAL/PLATELET
BASOS PCT: 0 %
Basophils Absolute: 0 cells/uL (ref 0–200)
EOS ABS: 116 {cells}/uL (ref 15–500)
EOS PCT: 2 %
HCT: 29.8 % — ABNORMAL LOW (ref 35.0–45.0)
Hemoglobin: 9.9 g/dL — ABNORMAL LOW (ref 11.7–15.5)
Lymphocytes Relative: 30 %
Lymphs Abs: 1740 cells/uL (ref 850–3900)
MCH: 31.9 pg (ref 27.0–33.0)
MCHC: 33.2 g/dL (ref 32.0–36.0)
MCV: 96.1 fL (ref 80.0–100.0)
MONOS PCT: 9 %
MPV: 8.9 fL (ref 7.5–12.5)
Monocytes Absolute: 522 cells/uL (ref 200–950)
Neutro Abs: 3422 cells/uL (ref 1500–7800)
Neutrophils Relative %: 59 %
PLATELETS: 315 10*3/uL (ref 140–400)
RBC: 3.1 MIL/uL — AB (ref 3.80–5.10)
RDW: 12.9 % (ref 11.0–15.0)
WBC: 5.8 10*3/uL (ref 3.8–10.8)

## 2017-05-11 ENCOUNTER — Encounter (HOSPITAL_COMMUNITY): Payer: Self-pay | Admitting: Psychiatry

## 2017-05-11 ENCOUNTER — Encounter: Payer: Self-pay | Admitting: Physician Assistant

## 2017-05-11 ENCOUNTER — Ambulatory Visit (INDEPENDENT_AMBULATORY_CARE_PROVIDER_SITE_OTHER): Payer: 59 | Admitting: Psychiatry

## 2017-05-11 VITALS — BP 124/70 | HR 78 | Resp 16 | Ht 63.0 in | Wt 131.0 lb

## 2017-05-11 DIAGNOSIS — Z881 Allergy status to other antibiotic agents status: Secondary | ICD-10-CM

## 2017-05-11 DIAGNOSIS — Z79899 Other long term (current) drug therapy: Secondary | ICD-10-CM | POA: Diagnosis not present

## 2017-05-11 DIAGNOSIS — F4310 Post-traumatic stress disorder, unspecified: Secondary | ICD-10-CM | POA: Diagnosis not present

## 2017-05-11 DIAGNOSIS — Z811 Family history of alcohol abuse and dependence: Secondary | ICD-10-CM

## 2017-05-11 DIAGNOSIS — Z818 Family history of other mental and behavioral disorders: Secondary | ICD-10-CM | POA: Diagnosis not present

## 2017-05-11 DIAGNOSIS — F411 Generalized anxiety disorder: Secondary | ICD-10-CM | POA: Diagnosis not present

## 2017-05-11 DIAGNOSIS — D638 Anemia in other chronic diseases classified elsewhere: Secondary | ICD-10-CM | POA: Insufficient documentation

## 2017-05-11 DIAGNOSIS — Z888 Allergy status to other drugs, medicaments and biological substances status: Secondary | ICD-10-CM

## 2017-05-11 DIAGNOSIS — F410 Panic disorder [episodic paroxysmal anxiety] without agoraphobia: Secondary | ICD-10-CM

## 2017-05-11 NOTE — Progress Notes (Signed)
Patient ID: LENEE FRANZE, female   DOB: 1951-01-21, 66 y.o.   MRN: [id number]  [facility] Follow up visit  AMMIE WARRICK [id number] 66 y.o.  05/11/2017 3:12 PM  Chief Complaint:  Establish care for panic attacks  History of Present Illness:   Patient Presents for follow up and medication management for  panic symptoms. Diagnosed with PTSD, panic symptoms and GAD.  She is doing better, following with nephrologist for kidney functions related to acute injury in recent past  PTSD: is relavant to her past. No significant flash backs Infrequent panic attacks, takes klonopine.  Worries are excessive at times mostly related to physical health as of now    Aggravating factors:husband illness. Her physical illness  Modifying factors; grandkids  No psychosis  Medical History; Past Medical History:  Diagnosis Date  . Acid reflux   . Anxiety   . High blood pressure   . Hypothyroid   . PTSD (post-traumatic stress disorder)     Allergies: Allergies  Allergen Reactions  . Macrodantin [Nitrofurantoin Macrocrystal] Rash  . Bactrim [Sulfamethoxazole-Trimethoprim]     ACUTE RENAL fAILURE/HYPONATREMIA/HYPERKALEMIA  . Zithromax [Azithromycin]     Medications: Outpatient Encounter Prescriptions as of 05/11/2017  Medication Sig  . albuterol (PROVENTIL HFA;VENTOLIN HFA) 108 (90 Base) MCG/ACT inhaler Inhale 2 puffs into the lungs every 4 (four) hours as needed for wheezing.  [name] albuterol (PROVENTIL) (2.5 MG/3ML) 0.083% nebulizer solution Take 3 mLs (2.5 mg total) by nebulization every 4 (four) hours as needed for wheezing or shortness of breath (please include nebulizer machine, hoses, and mask if needed.).  [name] atenolol (TENORMIN) 50 MG tablet take 1 and 1/2 tablets by mouth once daily  . Budesonide (UCERIS PO) Take by mouth.  . Calcium Carbonate-Vitamin D (CALCIUM-VITAMIN D) 500-200 MG-UNIT per tablet Take 1 tablet by mouth daily.  . ciprofloxacin (CIPRO) 500 MG  tablet Take 1 tablet (500 mg total) by mouth 2 (two) times daily.  . clonazePAM (KLONOPIN) 0.5 MG tablet take 1 tablet by mouth three times a day if needed  . levothyroxine (SYNTHROID, LEVOTHROID) 75 MCG tablet Take 1 tablet (75 mcg total) by mouth daily.  [name] MAGNESIUM PO Take by mouth.  . mometasone-formoterol (DULERA) 100-5 MCG/ACT AERO Inhale 2 puffs into the lungs 2 (two) times daily.  [name] omeprazole (PRILOSEC) 20 MG capsule take 1 capsule by mouth once daily   No facility-administered encounter medications on file as of 05/11/2017.     Family History; Family History  Problem Relation Age of Onset  . Anxiety disorder Mother   . OCD Daughter   . Alcohol abuse Father   . Anxiety disorder Sister        Labs:  Recent Results (from the past 2160 hour(s))  Basic metabolic panel     Status: Abnormal   Collection Time: 02/21/17 12:00 AM  Result Value Ref Range   Glucose 89 mg/dL   BUN 17 4 - 21 mg/dL   Creatinine 1.3 (A) 0.5 - 1.1 mg/dL   Potassium 4.3 3.4 - 5.3 mmol/L   Sodium 136 (A) 137 - 147 mmol/L  Basic metabolic panel     Status: None   Collection Time: 02/21/17 12:00 AM  Result Value Ref Range   EGFR (Non-African Amer.) 43    EGFR (African American) 50    Chloride 97 mmol/L   Carbon Dioxide, Total 30    Calcium 9.6 mg/dL  POCT hemoglobin     Status: Abnormal   Collection Time: 03/15/17  3:48 PM  Result Value Ref Range   Hemoglobin 11.4 (A) 12.2 - 16.2 g/dL  Urine Culture     Status: None   Collection Time: 03/15/17  4:44 PM  Result Value Ref Range   Culture ENTEROCOCCUS SPECIES    Colony Count Greater than 100,000 CFU/mL    Organism ID, Bacteria ENTEROCOCCUS SPECIES       Susceptibility   Enterococcus species -  (no method available)    AMPICILLIN <=2 Sensitive     LEVOFLOXACIN 0.5 Sensitive     NITROFURANTOIN <=16 Sensitive     VANCOMYCIN 1 Sensitive     TETRACYCLINE >=16 Resistant   POCT urinalysis dipstick     Status: Abnormal   Collection Time: 03/15/17   4:48 PM  Result Value Ref Range   Color, UA yellow    Clarity, UA clear    Glucose, UA neg    Bilirubin, UA neg    Ketones, UA neg    Spec Grav, UA 1.010 1.010 - 1.025   Blood, UA neg    pH, UA 5.5 5.0 - 8.0   Protein, UA neg    Urobilinogen, UA 0.2 0.2 or 1.0 E.U./dL   Nitrite, UA neg    Leukocytes, UA Small (1+) (A) Negative  Basic Metabolic Panel (BMET)     Status: Abnormal   Collection Time: 03/23/17  2:39 PM  Result Value Ref Range   Sodium 138 135 - 146 mmol/L   Potassium 4.2 3.5 - 5.3 mmol/L   Chloride 100 98 - 110 mmol/L   CO2 27 20 - 31 mmol/L   Glucose, Bld 97 65 - 99 mg/dL   BUN 11 7 - 25 mg/dL   Creat 1.20 (H) 0.50 - 0.99 mg/dL    Comment:   For patients > or = 66 years of age: The upper reference limit for Creatinine is approximately 13% higher for people identified as African-American.      Calcium 9.2 8.6 - 10.4 mg/dL  CBC and differential     Status: Abnormal   Collection Time: 04/11/17 12:00 AM  Result Value Ref Range   Hemoglobin 10.4 (A) 12.0 - 16.0 g/dL   HCT 29 (A) 36 - 46 %   Platelets 333 150 - 399 K/L   WBC 6.2 65^9/DJ  Basic metabolic panel     Status: Abnormal   Collection Time: 04/11/17 12:00 AM  Result Value Ref Range   Glucose 100 mg/dL   BUN 10 4 - 21 mg/dL   Creatinine 1.2 (A) 0.5 - 1.1 mg/dL   Potassium 4.5 3.4 - 5.3 mmol/L   Sodium 135 (A) 137 - 147 mmol/L  POCT hemoglobin     Status: Abnormal   Collection Time: 04/18/17  2:20 PM  Result Value Ref Range   Hemoglobin 10.3 (A) 12.2 - 16.2 g/dL  CBC w/Diff/Platelet     Status: Abnormal   Collection Time: 05/10/17  3:06 PM  Result Value Ref Range   WBC 5.8 3.8 - 10.8 K/uL   RBC 3.10 (L) 3.80 - 5.10 MIL/uL   Hemoglobin 9.9 (L) 11.7 - 15.5 g/dL   HCT 29.8 (L) 35.0 - 45.0 %   MCV 96.1 80.0 - 100.0 fL   MCH 31.9 27.0 - 33.0 pg   MCHC 33.2 32.0 - 36.0 g/dL   RDW 12.9 11.0 - 15.0 %   Platelets 315 140 - 400 K/uL   MPV 8.9 7.5 - 12.5 fL   Neutro Abs 3,422 1,500 - 7,800 cells/uL  Lymphs Abs 1,740 850 - 3,900 cells/uL   Monocytes Absolute 522 200 - 950 cells/uL   Eosinophils Absolute 116 15 - 500 cells/uL   Basophils Absolute 0 0 - 200 cells/uL   Neutrophils Relative % 59 %   Lymphocytes Relative 30 %   Monocytes Relative 9 %   Eosinophils Relative 2 %   Basophils Relative 0 %   Smear Review Criteria for review not met   Basic Metabolic Panel (BMET)     Status: Abnormal   Collection Time: 05/10/17  3:06 PM  Result Value Ref Range   Sodium 137 135 - 146 mmol/L   Potassium 4.5 3.5 - 5.3 mmol/L   Chloride 98 98 - 110 mmol/L   CO2 30 20 - 31 mmol/L   Glucose, Bld 90 65 - 99 mg/dL   BUN 11 7 - 25 mg/dL   Creat 1.19 (H) 0.50 - 0.99 mg/dL    Comment:   For patients > or = 66 years of age: The upper reference limit for Creatinine is approximately 13% higher for people identified as African-American.      Calcium 9.2 8.6 - 10.4 mg/dL      Mental Status Examination;   Psychiatric Specialty Exam: Physical Exam  Constitutional: She appears well-nourished.  Skin: She is not diaphoretic.    Review of Systems  Constitutional: Negative for fever.  Cardiovascular: Negative for palpitations.  Gastrointestinal: Negative for nausea.  Skin: Negative for rash.  Neurological: Negative for tremors and headaches.  Psychiatric/Behavioral: Negative for depression.    Blood pressure 124/70, pulse 78, resp. rate 16, height 5' 3"  (1.6 m), weight 131 lb (59.4 kg), SpO2 96 %.Body mass index is 23.21 kg/m.  General Appearance: Casual  Eye Contact::  Fair  Speech:  Slow  Volume:  Decreased  Mood:  euthymic  Affect:  Pleasant and less worriful today  Thought Process:  Coherent and intact  Orientation:  Full (Time, Place, and Person)  Thought Content:  Rumination  Suicidal Thoughts:  No  Homicidal Thoughts:  No  Memory:  Immediate;   Fair Recent;   Fair  Judgement:  Fair  Insight:  Shallow  Psychomotor Activity:  Normal  Concentration:  Fair  Recall:  Fair   Akathisia:  Negative  Handed:  Right  AIMS (if indicated):     Assets:  Social Support Vocational/Educational  Sleep:        Assessment: Axis I: Panic disorder. Possible PTSD. Rule out generalized anxiety disorder. Nicotine dependence  Axis II: Deferred  Axis III:  Past Medical History:  Diagnosis Date  . Acid reflux   . Anxiety   . High blood pressure   . Hypothyroid   . PTSD (post-traumatic stress disorder)     Axis IV: Psychosocial. History of trauma   Treatment Plan and Summary:  Anxiety and GAD: baseline. Continue klonopine. Reluctant to cut down. Understands the risk. Will continue for now She has got it filled recently  PTSD: baseline. No addup med needed for now  Provided supportive therapy discussing review side effects and concerns FU 3 months  Merian Capron, MD 05/11/2017

## 2017-05-12 ENCOUNTER — Encounter: Payer: Self-pay | Admitting: Physician Assistant

## 2017-05-12 ENCOUNTER — Ambulatory Visit (INDEPENDENT_AMBULATORY_CARE_PROVIDER_SITE_OTHER): Payer: Medicare Other | Admitting: Physician Assistant

## 2017-05-12 ENCOUNTER — Ambulatory Visit: Payer: Self-pay

## 2017-05-12 VITALS — BP 104/67 | HR 70 | Ht 63.0 in | Wt 131.0 lb

## 2017-05-12 DIAGNOSIS — R710 Precipitous drop in hematocrit: Secondary | ICD-10-CM

## 2017-05-12 DIAGNOSIS — E559 Vitamin D deficiency, unspecified: Secondary | ICD-10-CM | POA: Diagnosis not present

## 2017-05-12 DIAGNOSIS — F329 Major depressive disorder, single episode, unspecified: Secondary | ICD-10-CM

## 2017-05-12 DIAGNOSIS — R7989 Other specified abnormal findings of blood chemistry: Secondary | ICD-10-CM | POA: Diagnosis not present

## 2017-05-12 DIAGNOSIS — E538 Deficiency of other specified B group vitamins: Secondary | ICD-10-CM

## 2017-05-12 DIAGNOSIS — R4589 Other symptoms and signs involving emotional state: Secondary | ICD-10-CM

## 2017-05-12 DIAGNOSIS — E039 Hypothyroidism, unspecified: Secondary | ICD-10-CM

## 2017-05-12 LAB — VITAMIN B12: Vitamin B-12: 207

## 2017-05-12 MED ORDER — CYANOCOBALAMIN 1000 MCG/ML IJ SOLN
1000.0000 ug | Freq: Once | INTRAMUSCULAR | Status: AC
  Administered 2017-05-12: 1000 ug via INTRAMUSCULAR

## 2017-05-12 MED ORDER — RANITIDINE HCL 150 MG PO TABS: 150.0000 mg | ORAL_TABLET | Freq: Two times a day (BID) | ORAL | 11 refills | Status: DC

## 2017-05-12 NOTE — Progress Notes (Signed)
Subjective:    Patient ID: Claire [name], female    DOB: 09-14-1951, 66 y.o.   MRN: [id number]  HPI  Pt is a 66 yo female with CKD stage III and low hgb. Her kidney injury occurred from some reaction to abx. She does not appear to be iron deficient and oral iron does not seem to be helping. She has been worked up by GI and they feel like coming from CKD. Nephrologist does not feel like kidney function is bad enough to create low hgb. She did have her b12 levels checked and 207. She wonders if she can treat this and would help her hemoglobin. This really has her mood down. She just wonders at times "will this be the thing that kills her".   .. Active Ambulatory Problems    Diagnosis Date Noted  . PTSD (post-traumatic stress disorder) 11/10/2011  . Neurosis, anxiety, panic type 11/10/2011  . Acquired hypothyroidism 11/24/2014  . BP (high blood pressure) 07/24/2012  . Hypothyroid   . Chronic use of benzodiazepine for therapeutic purpose 08/11/2015  . Acute renal failure (HCC) 12/19/2016  . Hyponatremia 12/19/2016  . Hyperkalemia 12/19/2016  . Centrilobular emphysema (HCC) 12/20/2016  . Decreased hemoglobin 12/20/2016  . CKD (chronic kidney disease) stage 3, GFR 30-59 ml/min 01/16/2017  . Iron deficiency anemia 01/16/2017  . Acute kidney injury (HCC) 03/17/2017  . Anemia of chronic disease 05/11/2017  . Low serum vitamin B12 05/14/2017  . Vitamin D deficiency 05/14/2017  . Depressed mood 05/14/2017   Resolved Ambulatory Problems    Diagnosis Date Noted  . No Resolved Ambulatory Problems   Past Medical History:  Diagnosis Date  . Acid reflux   . Anxiety   . High blood pressure   . Hypothyroid   . PTSD (post-traumatic stress disorder)      Review of Systems  All other systems reviewed and are negative.      Objective:   Physical Exam  Constitutional: She is oriented to person, place, and time. She appears well-developed and well-nourished.  HENT:  Head: Normocephalic  and atraumatic.  Cardiovascular: Normal rate, regular rhythm and normal heart sounds.   Pulmonary/Chest: Effort normal and breath sounds normal.  Neurological: She is alert and oriented to person, place, and time.  Psychiatric: She has a normal mood and affect. Her behavior is normal.          Assessment & Plan:  [name] KitchenMarland KitchenDiagnoses and all orders for this visit:  Low serum vitamin B12 -     B12 -     cyanocobalamin ((VITAMIN B-12)) injection 1,000 mcg; Inject 1 mL (1,000 mcg total) into the muscle once.  Decreased hemoglobin -     Ferritin -     IBC panel -     B12 -     Methylmalonic Acid -     Folate -     Hemoglobin  Hypothyroidism, unspecified type -     TSH  Vitamin D deficiency -     VITAMIN D 25 Hydroxy (Vit-D Deficiency, Fractures)  Depressed mood  Other orders -     ranitidine (ZANTAC) 150 MG tablet; Take 1 tablet (150 mg total) by mouth 2 (two) times daily.   b12 shot given today. Will scan in labs of 207 b12. According to last CBC she was still normocytic anemi and b12 is a macrocytic anemia. She is on upper limits of normal. Will see if b12 makes any difference. I would continue with oral iron since your  levels were the best on it. Pt asked about leukemia. Reassurance that platelets and wBC look good. I do not think this is case. Recheck in 2-3 months b12, CBC. If no improvement consider referral to hematology.

## 2017-05-12 NOTE — Patient Instructions (Addendum)
b12 1000mg  daily.

## 2017-05-14 DIAGNOSIS — F329 Major depressive disorder, single episode, unspecified: Secondary | ICD-10-CM

## 2017-05-14 DIAGNOSIS — E538 Deficiency of other specified B group vitamins: Secondary | ICD-10-CM | POA: Insufficient documentation

## 2017-05-14 DIAGNOSIS — R4589 Other symptoms and signs involving emotional state: Secondary | ICD-10-CM | POA: Insufficient documentation

## 2017-05-14 DIAGNOSIS — E559 Vitamin D deficiency, unspecified: Secondary | ICD-10-CM | POA: Insufficient documentation

## 2017-05-18 ENCOUNTER — Ambulatory Visit (HOSPITAL_COMMUNITY): Payer: Self-pay | Admitting: Licensed Clinical Social Worker

## 2017-05-31 ENCOUNTER — Encounter: Payer: Self-pay | Admitting: Physician Assistant

## 2017-06-01 ENCOUNTER — Ambulatory Visit (HOSPITAL_COMMUNITY): Payer: Self-pay | Admitting: Licensed Clinical Social Worker

## 2017-06-06 ENCOUNTER — Other Ambulatory Visit (HOSPITAL_COMMUNITY): Payer: Self-pay | Admitting: *Deleted

## 2017-06-06 MED ORDER — CLONAZEPAM 0.5 MG PO TABS: ORAL_TABLET | ORAL | 0 refills | Status: DC

## 2017-06-06 NOTE — Telephone Encounter (Signed)
Can send refill 

## 2017-06-06 NOTE — Telephone Encounter (Signed)
Medication refill- patient called office requesting a refill for Klonopin. Per dr. Gilmore LarocheAkhtar, refill is authorize for Klonopin 0.5mg , #90. Verbal order was provider to [facility]. Notified pt. Nothing further is needed at this time.

## 2017-06-06 NOTE — Telephone Encounter (Signed)
Medication refill- patient called office requesting a refill for Klonopin. Medication was last filled on 05/05/17. Please advise.

## 2017-06-09 ENCOUNTER — Ambulatory Visit: Payer: Medicare Other

## 2017-06-12 ENCOUNTER — Ambulatory Visit (INDEPENDENT_AMBULATORY_CARE_PROVIDER_SITE_OTHER): Payer: Medicare Other | Admitting: Sports Medicine

## 2017-06-12 VITALS — BP 125/77 | HR 71 | Wt 129.0 lb

## 2017-06-12 DIAGNOSIS — E538 Deficiency of other specified B group vitamins: Secondary | ICD-10-CM

## 2017-06-12 DIAGNOSIS — R7989 Other specified abnormal findings of blood chemistry: Secondary | ICD-10-CM | POA: Diagnosis not present

## 2017-06-12 LAB — BASIC METABOLIC PANEL
BUN: 10 (ref 4–21)
Creatinine: 1.3 — AB (ref 0.5–1.1)
GLUCOSE: 94
POTASSIUM: 4 (ref 3.4–5.3)
SODIUM: 137 (ref 137–147)

## 2017-06-12 LAB — CBC AND DIFFERENTIAL
HCT: 30 — AB (ref 36–46)
HEMOGLOBIN: 10.1 — AB (ref 12.0–16.0)
Platelets: 280 (ref 150–399)
WBC: 6.3

## 2017-06-12 MED ORDER — CYANOCOBALAMIN 1000 MCG/ML IJ SOLN
1000.0000 ug | Freq: Once | INTRAMUSCULAR | Status: AC
  Administered 2017-06-12: 1000 ug via INTRAMUSCULAR

## 2017-06-12 NOTE — Progress Notes (Signed)
Patient is here for b12 injection. Patient states she feels better since she has been getting injections. Reminded patient to get her labs rechecked in 2-3 months from initially starting b12 injections. Patient states she is also taking subl b12. Injection given without complications.

## 2017-06-13 ENCOUNTER — Ambulatory Visit: Payer: Self-pay | Admitting: Physician Assistant

## 2017-06-16 ENCOUNTER — Telehealth: Payer: Self-pay | Admitting: Osteopathic Medicine

## 2017-06-16 NOTE — Telephone Encounter (Signed)
Received fax report for labs drawn 06/12/2017. Kidney function is stable and anemia is a bit better. Looks like these labs were drawn by nephrologist. Sent for abstraction.

## 2017-06-17 ENCOUNTER — Encounter: Payer: Self-pay | Admitting: Physician Assistant

## 2017-06-17 LAB — BASIC METABOLIC PANEL
CALCIUM: 9.5
Carbon Dioxide, Total: 34
Chloride: 97
EGFR (African American): 48
EGFR (Non-African Amer.): 41

## 2017-06-17 LAB — CBC WITH DIFFERENTIAL/PLATELET
MCV: 96.7
Red Blood Cell Cast: 3.07

## 2017-06-22 ENCOUNTER — Telehealth: Payer: Self-pay

## 2017-06-22 NOTE — Telephone Encounter (Signed)
Pt called stating she has a history of kidney concerns and she does not feel like current nephrologist is explaining concerns properly, and she been unable to get a callback from them to discuss current labs. Pt would like to be referred to a new nephrologist in the [place] area. Please advise.

## 2017-06-22 NOTE — Telephone Encounter (Signed)
Have her do a search herself, and whoever she decides on I'm happy to place a referral.

## 2017-06-23 NOTE — Telephone Encounter (Signed)
Pt informed

## 2017-07-04 ENCOUNTER — Other Ambulatory Visit: Payer: Self-pay | Admitting: Physician Assistant

## 2017-07-04 ENCOUNTER — Ambulatory Visit (INDEPENDENT_AMBULATORY_CARE_PROVIDER_SITE_OTHER): Payer: Medicare Other | Admitting: Physician Assistant

## 2017-07-04 ENCOUNTER — Telehealth: Payer: Self-pay | Admitting: *Deleted

## 2017-07-04 VITALS — BP 130/77 | HR 67 | Wt 132.0 lb

## 2017-07-04 DIAGNOSIS — D638 Anemia in other chronic diseases classified elsewhere: Secondary | ICD-10-CM

## 2017-07-04 DIAGNOSIS — N183 Chronic kidney disease, stage 3 unspecified: Secondary | ICD-10-CM

## 2017-07-04 LAB — POCT HEMOGLOBIN: Hemoglobin: 9.5 g/dL — AB (ref 12.2–16.2)

## 2017-07-04 NOTE — Telephone Encounter (Signed)
Patient would like a referral to another nephrologist. She did not have anyone in mind. I do see that she refused an appointment nutrition therapy in re to acute renal failure.

## 2017-07-04 NOTE — Progress Notes (Signed)
Patient wanted to come in to have her hemoglobin checked. I did call this patient this morning because after reviewing chart orders were placed for lab work which included a CBC. Patient is somewhat anxious and wanted to come in to have hemoglobin checked for her own personal knowledge. Point of care hemoglobin showed that it had dropped from last time. Patient did mention she stopped taking her iron for several days. The patient still has the lab orders from June and will get those done at the end of the month

## 2017-07-05 NOTE — Progress Notes (Signed)
Hi Claire [name],  Your hemoglobin continues to be low. It's possible this is due to your chronic kidney disease or iron deficiency or some combination.  In order to treat appropriately, we need some more data. Lesly RubensteinJade has ordered some labs for you to look for low iron and vitamin deficiencies. I recommend having this done this week. You do not need to be fasting.  I would also like you to pick up an occult blood stool test and perform at home to rule out blood loss from your GI tract. I will leave this at the front desk for you.  Let me know if you have any questions or concerns.  Best, Vinetta Bergamoharley

## 2017-07-08 ENCOUNTER — Other Ambulatory Visit (HOSPITAL_COMMUNITY): Payer: Self-pay | Admitting: Psychiatry

## 2017-07-10 ENCOUNTER — Other Ambulatory Visit: Payer: Self-pay | Admitting: *Deleted

## 2017-07-10 ENCOUNTER — Telehealth (HOSPITAL_COMMUNITY): Payer: Self-pay | Admitting: Psychiatry

## 2017-07-10 DIAGNOSIS — Z1211 Encounter for screening for malignant neoplasm of colon: Secondary | ICD-10-CM

## 2017-07-10 LAB — POC HEMOCCULT BLD/STL (HOME/3-CARD/SCREEN)
Card #3 Fecal Occult Blood, POC: NEGATIVE
FECAL OCCULT BLD: NEGATIVE
Fecal Occult Blood, POC: NEGATIVE

## 2017-07-10 NOTE — Telephone Encounter (Signed)
Pt needs refill on klonopin  °

## 2017-07-11 MED ORDER — CLONAZEPAM 0.5 MG PO TABS: ORAL_TABLET | ORAL | 0 refills | Status: DC

## 2017-07-11 NOTE — Telephone Encounter (Signed)
Pt will need a prescription written for klonopin.

## 2017-07-11 NOTE — Telephone Encounter (Signed)
klonopine printed for pickup 

## 2017-07-13 ENCOUNTER — Telehealth: Payer: Self-pay | Admitting: Physician Assistant

## 2017-07-13 ENCOUNTER — Ambulatory Visit (INDEPENDENT_AMBULATORY_CARE_PROVIDER_SITE_OTHER): Payer: Medicare Other | Admitting: Physician Assistant

## 2017-07-13 VITALS — BP 135/78 | HR 72 | Temp 97.7°F | Wt 130.0 lb

## 2017-07-13 DIAGNOSIS — N183 Chronic kidney disease, stage 3 unspecified: Secondary | ICD-10-CM

## 2017-07-13 DIAGNOSIS — D649 Anemia, unspecified: Secondary | ICD-10-CM

## 2017-07-13 DIAGNOSIS — R7989 Other specified abnormal findings of blood chemistry: Secondary | ICD-10-CM | POA: Diagnosis not present

## 2017-07-13 DIAGNOSIS — E538 Deficiency of other specified B group vitamins: Secondary | ICD-10-CM

## 2017-07-13 MED ORDER — FERROUS SULFATE 325 (65 FE) MG PO TBEC: DELAYED_RELEASE_TABLET | ORAL | 11 refills | Status: AC

## 2017-07-13 MED ORDER — CYANOCOBALAMIN 1000 MCG/ML IJ SOLN
1000.0000 ug | Freq: Once | INTRAMUSCULAR | Status: AC
  Administered 2017-07-13: 1000 ug via INTRAMUSCULAR

## 2017-07-13 NOTE — Progress Notes (Signed)
HPI:                                                                Claire [name] is a 66 y.o. female who presents to [facility] [place] today for anemia and CKD  Anemia: patient with long-standing history of normocytic anemia. She had a negative FOBT on 07/06/17. She is taking iron supplementation daily. She is due for labs ordered by PCP 05/12/17, which she has not had drawn.  CKD: [facility] Dr. Marcelline DeistNicholas McLean on 06/28/2017. She is requesting a referral to a new nephrologist because current nephrologist is "indecisive" and "scares her." Denies weakness, easy fatigability, anorexia, vomiting, mental status changes, and seizures.  Low serum B12: requesting injection today  Past Medical History:  Diagnosis Date  . Acid reflux   . Anxiety   . High blood pressure   . Hypothyroid   . PTSD (post-traumatic stress disorder)    No past surgical history on file. Social History  Substance Use Topics  . Smoking status: Current Every Day Smoker    Packs/day: 0.50    Years: 50.00    Types: Cigarettes  . Smokeless tobacco: Never Used  . Alcohol use No   family history includes Alcohol abuse in her father; Anxiety disorder in her mother and sister; OCD in her daughter.  ROS: negative except as noted in the HPI  Medications: Current Outpatient Prescriptions  Medication Sig Dispense Refill  . albuterol (PROVENTIL HFA;VENTOLIN HFA) 108 (90 Base) MCG/ACT inhaler Inhale 2 puffs into the lungs every 4 (four) hours as needed for wheezing. 2 Inhaler 11  . albuterol (PROVENTIL) (2.5 MG/3ML) 0.083% nebulizer solution Take 3 mLs (2.5 mg total) by nebulization every 4 (four) hours as needed for wheezing or shortness of breath (please include nebulizer machine, hoses, and mask if needed.). 30 vial 6  . atenolol (TENORMIN) 50 MG tablet take 1 and 1/2 tablets by mouth once daily 135 tablet 0  . Budesonide (UCERIS  PO) Take by mouth.    . Calcium Carbonate-Vitamin D (CALCIUM-VITAMIN D) 500-200 MG-UNIT per tablet Take 1 tablet by mouth daily.    . clonazePAM (KLONOPIN) 0.5 MG tablet take 1 tablet by mouth three times a day if needed 90 tablet 0  . levothyroxine (SYNTHROID, LEVOTHROID) 75 MCG tablet Take 1 tablet (75 mcg total) by mouth daily. 75 tablet 1  . MAGNESIUM PO Take by mouth.    . mometasone-formoterol (DULERA) 100-5 MCG/ACT AERO Inhale 2 puffs into the lungs 2 (two) times daily. 1 Inhaler 5  . ranitidine (ZANTAC) 150 MG tablet Take 1 tablet (150 mg total) by mouth 2 (two) times daily. 60 tablet 11   No current facility-administered medications for this visit.    Allergies  Allergen Reactions  . Macrodantin [Nitrofurantoin Macrocrystal] Rash  . Bactrim [Sulfamethoxazole-Trimethoprim]     ACUTE RENAL fAILURE/HYPONATREMIA/HYPERKALEMIA  . Zithromax [Azithromycin]        Objective:  BP 135/78 (BP Location: Left Arm, Patient Position: Sitting, Cuff Size: Normal)   Pulse 72   Temp 97.7 F (36.5 C) (Oral)   Wt 130 lb (59 kg)   SpO2 95%   BMI 23.03 kg/m  Gen: well-groomed, cooperative, not ill-appearing, no distress  HEENT: head normocephalic without obvious deformity, conjunctiva and cornea clear, there is lower eyelid edema bilaterally, trachea midline Pulm: Normal work of breathing, normal phonation, clear to auscultation bilaterally CV: Normal rate, regular rhythm, s1 and s2 distinct, no murmurs, clicks or rubs  Neuro: alert and oriented x 3, no tremor MSK: moving all extremities, normal gait and station, no peripheral edema Skin: no rash noted on exposed skin     Results for orders placed or performed in visit on 07/10/17 (from the past 72 hour(s))  POC Hemoccult Bld/Stl (3-Cd Home Screen)     Status: None   Collection Time: 07/10/17  5:12 PM  Result Value Ref Range   Card #1 Date 07/06/2017    Fecal Occult Blood, POC Negative Negative   Card #2 Date 07/07/2017    Card #2 Fecal  Occult Blod, POC Negative    Card #3 Date 07/08/2017    Card #3 Fecal Occult Blood, POC Negative    No results found.    Assessment and Plan: 66 y.o. female with   1. Low serum vitamin B12 Lab Results  Component Value Date   VITAMINB12 207 05/12/2017  - cyanocobalamin ((VITAMIN B-12)) injection 1,000 mcg; Inject 1 mL (1,000 mcg total) into the muscle once. - cont oral supplementation - due for recheck  2. High serum PTH Lab Results  Component Value Date   CALCIUM 9.5 06/12/2017   - last PTH 96, 06/28/2017; normal calcium - secondary to CKD  3. Normocytic anemia - unclear etiology due to patient noncompliance with labs. Most likely anemia of chronic disease. Recommended follow-up CBC, RBC indices, retic count, serum iron, TIBC, TSAT, ferritin and folate. Patient declined - last Iron 110, TIBC 334, Iron Sat 33, Ferritin 141 12/29/2016 - wnl - negative FOBT 07/10/2017 - ferrous sulfate 325 (65 FE) MG EC tablet; 1 tab PO TID every other day  Dispense: 90 tablet; Refill: 11  4. CKD (chronic kidney disease) stage 3, GFR 30-59 ml/min Lab Results  Component Value Date   CREATININE 1.3 (A) 06/12/2017   BUN 10 06/12/2017   NA 137 06/12/2017   K 4.0 06/12/2017   CL 97 06/12/2017   CO2 34 06/12/2017  - serum creatinine is at her baseline of 1.2-1.4. Last GFR 39  - etiology felt to be secondary to small vessel vascular disease and history of AKI with hypotension per nephrology note 06/12/2017 - patient very anxious about her diagnosis of CKD - continues to smoke, not open to cessation - BP at goal <140/90 on atenolol - last urine creatinine 122, protein/creatinine ratio 57 (06/28/2017) - wnl - referral placed with preference for [facility]. Patient states she does not want to go to [place] or [facility]. Recommended [facility] in [place], but patient states she does not have transport - Ambulatory referral to Nephrology  Patient education and anticipatory guidance given Patient  agrees with treatment plan Follow-up in 3 months with PCP for medication management or sooner as needed   I spent 25 minutes with this patient, greater than 50% was face-to-face time counseling regarding the above diagnoses  [name] PA-C

## 2017-07-13 NOTE — Progress Notes (Signed)
Pt stated it is time for her B12 inj.  Randalyn Rhea. Cummings, PA-C ordered injection to be given today.  Denies GI problems or dizziness.   Assessment- Pt tolerated injection well in left deltoid without complications.  Pt advised to return in 30 days for next injection.

## 2017-07-13 NOTE — Patient Instructions (Addendum)
-   Ferrous sulfate three times a day every other day with a drink containing vitamin C - Continue oral B12 supplements - Plan to go to the lab for blood work: they are open 7:30am-4:30am (closed 12:30 - 1:30 for lunch). Jade recommends if no improvement in your hemoglobin, following up with a blood specialist/hematologist  - I have placed a referral for nephrology with a [facility]. If you change your mind and decide you want to go to [facility] in [place], please call and let us know

## 2017-07-14 NOTE — Telephone Encounter (Signed)
error 

## 2017-07-19 ENCOUNTER — Encounter: Payer: Self-pay | Admitting: Physician Assistant

## 2017-07-19 DIAGNOSIS — R7989 Other specified abnormal findings of blood chemistry: Secondary | ICD-10-CM | POA: Insufficient documentation

## 2017-07-19 DIAGNOSIS — D649 Anemia, unspecified: Secondary | ICD-10-CM | POA: Insufficient documentation

## 2017-07-19 HISTORY — DX: Other specified abnormal findings of blood chemistry: R79.89

## 2017-07-20 LAB — IRON AND TIBC
%SAT: 18 % (ref 11–50)
IRON: 49 ug/dL (ref 45–160)
TIBC: 274 ug/dL (ref 250–450)
UIBC: 225 ug/dL

## 2017-07-20 LAB — TSH: TSH: 2.07 m[IU]/L

## 2017-07-20 LAB — VITAMIN B12: Vitamin B-12: 1030 pg/mL (ref 200–1100)

## 2017-07-20 LAB — FERRITIN: Ferritin: 84 ng/mL (ref 20–288)

## 2017-07-20 LAB — VITAMIN D 25 HYDROXY (VIT D DEFICIENCY, FRACTURES): Vit D, 25-Hydroxy: 50 ng/mL (ref 30–100)

## 2017-07-20 LAB — FOLATE: Folate: 13.1 ng/mL (ref >5.4)

## 2017-07-20 LAB — HEMOGLOBIN: Hemoglobin: 10.8 g/dL — ABNORMAL LOW (ref 11.7–15.5)

## 2017-07-21 ENCOUNTER — Telehealth: Payer: Self-pay

## 2017-07-21 NOTE — Progress Notes (Signed)
Thyroid looks great.  Vitamin d looks great. What are you currently taking?  Iron stores and studies look good. Hemoglobin stable at 10.2.

## 2017-07-21 NOTE — Telephone Encounter (Signed)
Sharida would like to know if she should receive the flu vaccine this year. Please advise.

## 2017-07-21 NOTE — Telephone Encounter (Signed)
Yes

## 2017-07-23 LAB — METHYLMALONIC ACID, SERUM: Methylmalonic Acid, Quant: 307 nmol/L (ref 87–318)

## 2017-07-24 NOTE — Telephone Encounter (Signed)
Patient advised.

## 2017-07-25 ENCOUNTER — Telehealth: Payer: Self-pay | Admitting: Physician Assistant

## 2017-07-25 NOTE — Telephone Encounter (Signed)
Patient called and wants her pharmacy changed to [facility] on [address] starting today

## 2017-07-26 NOTE — Telephone Encounter (Signed)
Pharmacy updated.

## 2017-07-28 ENCOUNTER — Other Ambulatory Visit: Payer: Self-pay

## 2017-07-28 MED ORDER — RANITIDINE HCL 150 MG PO TABS: 150.0000 mg | ORAL_TABLET | Freq: Two times a day (BID) | ORAL | 11 refills | Status: DC

## 2017-07-28 MED ORDER — ATENOLOL 50 MG PO TABS: 75.0000 mg | ORAL_TABLET | Freq: Every day | ORAL | 0 refills | Status: DC

## 2017-08-03 ENCOUNTER — Encounter (HOSPITAL_COMMUNITY): Payer: Self-pay | Admitting: Psychiatry

## 2017-08-03 ENCOUNTER — Ambulatory Visit (INDEPENDENT_AMBULATORY_CARE_PROVIDER_SITE_OTHER): Payer: Medicare Other | Admitting: Psychiatry

## 2017-08-03 VITALS — BP 120/72 | HR 93 | Resp 16 | Ht 63.0 in | Wt 127.0 lb

## 2017-08-03 DIAGNOSIS — F4310 Post-traumatic stress disorder, unspecified: Secondary | ICD-10-CM | POA: Diagnosis not present

## 2017-08-03 DIAGNOSIS — N289 Disorder of kidney and ureter, unspecified: Secondary | ICD-10-CM | POA: Diagnosis not present

## 2017-08-03 DIAGNOSIS — F411 Generalized anxiety disorder: Secondary | ICD-10-CM | POA: Diagnosis not present

## 2017-08-03 DIAGNOSIS — F410 Panic disorder [episodic paroxysmal anxiety] without agoraphobia: Secondary | ICD-10-CM | POA: Diagnosis not present

## 2017-08-03 DIAGNOSIS — F17210 Nicotine dependence, cigarettes, uncomplicated: Secondary | ICD-10-CM

## 2017-08-03 DIAGNOSIS — Z818 Family history of other mental and behavioral disorders: Secondary | ICD-10-CM

## 2017-08-03 MED ORDER — CLONAZEPAM 0.5 MG PO TABS: ORAL_TABLET | ORAL | 0 refills | Status: DC

## 2017-08-03 NOTE — Progress Notes (Signed)
Patient ID: Claire [name], female   DOB: 1951-01-12, 66 y.o.   MRN: [id number]  [facility] Follow up visit  Claire [name] [id number] 66 y.o.  08/03/2017 3:10 PM  Chief Complaint:  Establish care for panic attacks  History of Present Illness:   Patient Presents for follow up and medication management for  panic symptoms. Diagnosed with PTSD, panic symptoms and GAD.  She is doing better, following with nephrologist for kidney functions related to acute injury in recent past Patient has worries related to living with her daughter who is snacking and gives her a hard time she is planning to move with her son to another apartment PTSD is related to her past some flashbacks but she does not want to be on any other medication was Klonopin that helps her anxiety panic attacks Depression is not worse    Aggravating factors:husband illness.her physical illness. Living with daughter Modifying factors; grandkids  No psychosis  Medical History; Past Medical History:  Diagnosis Date  . Acid reflux   . Anxiety   . High blood pressure   . High serum parathyroid hormone (PTH) 07/19/2017   96 06/28/2014, 162 12/29/16  . Hypothyroid   . PTSD (post-traumatic stress disorder)     Allergies: Allergies  Allergen Reactions  . Macrodantin [Nitrofurantoin Macrocrystal] Rash  . Bactrim [Sulfamethoxazole-Trimethoprim]     ACUTE RENAL fAILURE/HYPONATREMIA/HYPERKALEMIA  . Zithromax [Azithromycin]     Medications: Outpatient Encounter Prescriptions as of 08/03/2017  Medication Sig  . albuterol (PROVENTIL HFA;VENTOLIN HFA) 108 (90 Base) MCG/ACT inhaler Inhale 2 puffs into the lungs every 4 (four) hours as needed for wheezing.  [name] albuterol (PROVENTIL) (2.5 MG/3ML) 0.083% nebulizer solution Take 3 mLs (2.5 mg total) by nebulization every 4 (four) hours as needed for wheezing or shortness of breath (please include nebulizer machine, hoses, and mask if needed.).  [name] atenolol  (TENORMIN) 50 MG tablet Take 1.5 tablets (75 mg total) by mouth daily.  . Budesonide (UCERIS PO) Take by mouth.  . Calcium Carbonate-Vitamin D (CALCIUM-VITAMIN D) 500-200 MG-UNIT per tablet Take 1 tablet by mouth daily.  . clonazePAM (KLONOPIN) 0.5 MG tablet take 1 tablet by mouth three times a day if needed  . Cobalamine Combinations (B-12) [phone] MCG SUBL Place under the tongue.  . ferrous sulfate 325 (65 FE) MG EC tablet 1 tab PO TID every other day  . levothyroxine (SYNTHROID, LEVOTHROID) 75 MCG tablet Take 1 tablet (75 mcg total) by mouth daily.  [name] MAGNESIUM PO Take by mouth.  . mometasone-formoterol (DULERA) 100-5 MCG/ACT AERO Inhale 2 puffs into the lungs 2 (two) times daily.  . ranitidine (ZANTAC) 150 MG tablet Take 1 tablet (150 mg total) by mouth 2 (two) times daily.  . [DISCONTINUED] clonazePAM (KLONOPIN) 0.5 MG tablet take 1 tablet by mouth three times a day if needed   No facility-administered encounter medications on file as of 08/03/2017.     Family History; Family History  Problem Relation Age of Onset  . Anxiety disorder Mother   . OCD Daughter   . Alcohol abuse Father   . Anxiety disorder Sister        Labs:  Recent Results (from the past 2160 hour(s))  CBC w/Diff/Platelet     Status: Abnormal   Collection Time: 05/10/17  3:06 PM  Result Value Ref Range   WBC 5.8 3.8 - 10.8 K/uL   RBC 3.10 (L) 3.80 - 5.10 MIL/uL   Hemoglobin 9.9 (L) 11.7 - 15.5 g/dL  HCT 29.8 (L) 35.0 - 45.0 %   MCV 96.1 80.0 - 100.0 fL   MCH 31.9 27.0 - 33.0 pg   MCHC 33.2 32.0 - 36.0 g/dL   RDW 12.9 11.0 - 15.0 %   Platelets 315 140 - 400 K/uL   MPV 8.9 7.5 - 12.5 fL   Neutro Abs 3,422 1,500 - 7,800 cells/uL   Lymphs Abs 1,740 850 - 3,900 cells/uL   Monocytes Absolute 522 200 - 950 cells/uL   Eosinophils Absolute 116 15 - 500 cells/uL   Basophils Absolute 0 0 - 200 cells/uL   Neutrophils Relative % 59 %   Lymphocytes Relative 30 %   Monocytes Relative 9 %   Eosinophils Relative  2 %   Basophils Relative 0 %   Smear Review Criteria for review not met   Basic Metabolic Panel (BMET)     Status: Abnormal   Collection Time: 05/10/17  3:06 PM  Result Value Ref Range   Sodium 137 135 - 146 mmol/L   Potassium 4.5 3.5 - 5.3 mmol/L   Chloride 98 98 - 110 mmol/L   CO2 30 20 - 31 mmol/L   Glucose, Bld 90 65 - 99 mg/dL   BUN 11 7 - 25 mg/dL   Creat 1.19 (H) 0.50 - 0.99 mg/dL    Comment:   For patients > or = 66 years of age: The upper reference limit for Creatinine is approximately 13% higher for people identified as African-American.      Calcium 9.2 8.6 - 10.4 mg/dL  Vitamin B12     Status: None   Collection Time: 05/12/17 12:00 AM  Result Value Ref Range   Vitamin B-12 207   CBC and differential     Status: Abnormal   Collection Time: 06/12/17 12:00 AM  Result Value Ref Range   Hemoglobin 10.1 (A) 12.0 - 16.0   HCT 30 (A) 36 - 46   Platelets 280 150 - 399   WBC 6.3   Basic metabolic panel     Status: Abnormal   Collection Time: 06/12/17 12:00 AM  Result Value Ref Range   Glucose 94    BUN 10 4 - 21   Creatinine 1.3 (A) 0.5 - 1.1   Potassium 4.0 3.4 - 5.3   Sodium 137 137 - 147  CBC with Differential/Platelet     Status: None   Collection Time: 06/12/17 12:00 AM  Result Value Ref Range   Red Blood Cell Cast 3.07    MCV 62.1   Basic metabolic panel     Status: None   Collection Time: 06/12/17 12:00 AM  Result Value Ref Range   EGFR (Non-African Amer.) 41    EGFR (African American) 48    Chloride 97    Carbon Dioxide, Total 34    Calcium 9.5   POCT hemoglobin     Status: Abnormal   Collection Time: 07/04/17  2:39 PM  Result Value Ref Range   Hemoglobin 9.5 (A) 12.2 - 16.2 g/dL  POC Hemoccult Bld/Stl (3-Cd Home Screen)     Status: None   Collection Time: 07/10/17  5:12 PM  Result Value Ref Range   Card #1 Date 07/06/2017    Fecal Occult Blood, POC Negative Negative   Card #2 Date 07/07/2017    Card #2 Fecal Occult Blod, POC Negative    Card #3  Date 07/08/2017    Card #3 Fecal Occult Blood, POC Negative   B12     Status:  None   Collection Time: 07/19/17  2:46 PM  Result Value Ref Range   Vitamin B-12 1,030 200 - 1,100 pg/mL  Methylmalonic Acid     Status: None   Collection Time: 07/19/17  2:46 PM  Result Value Ref Range   Methylmalonic Acid, Quant 307 87 - 318 nmol/L  Folate     Status: None   Collection Time: 07/19/17  2:46 PM  Result Value Ref Range   Folate 13.1 >5.4 ng/mL    Comment: Reference Range >17 years:   Low: <3.4 ng/mL              Borderline: 3.4-5.4 ng/mL              Normal: >5.4 ng/mL     Hemoglobin     Status: Abnormal   Collection Time: 07/19/17  2:46 PM  Result Value Ref Range   Hemoglobin 10.8 (L) 11.7 - 15.5 g/dL  Ferritin     Status: None   Collection Time: 07/19/17  2:47 PM  Result Value Ref Range   Ferritin 84 20 - 288 ng/mL  Iron and TIBC     Status: None   Collection Time: 07/19/17  2:47 PM  Result Value Ref Range   Iron 49 45 - 160 ug/dL   UIBC 225 ug/dL   TIBC 274 250 - 450 ug/dL   %SAT 18 11 - 50 %  TSH     Status: None   Collection Time: 07/19/17  2:49 PM  Result Value Ref Range   TSH 2.07 mIU/L    Comment:   Reference Range   > or = 20 Years  0.40-4.50   Pregnancy Range First trimester  0.26-2.66 Second trimester 0.55-2.73 Third trimester  0.43-2.91     VITAMIN D 25 Hydroxy (Vit-D Deficiency, Fractures)     Status: None   Collection Time: 07/19/17  2:49 PM  Result Value Ref Range   Vit D, 25-Hydroxy 50 30 - 100 ng/mL    Comment: Vitamin D Status           25-OH Vitamin D        Deficiency                <20 ng/mL        Insufficiency         20 - 29 ng/mL        Optimal             > or = 30 ng/mL   For 25-OH Vitamin D testing on patients on D2-supplementation and patients for whom quantitation of D2 and D3 fractions is required, the QuestAssureD 25-OH VIT D, (D2,D3), LC/MS/MS is recommended: order code [phone] [phone] (patients > 2 yrs).       Mental Status  Examination;   Psychiatric Specialty Exam: Physical Exam  Constitutional: She appears well-nourished.  Skin: She is not diaphoretic.    Review of Systems  Constitutional: Negative for fever.  Cardiovascular: Negative for chest pain.  Gastrointestinal: Negative for nausea.  Skin: Negative for rash.  Neurological: Negative for tremors and headaches.  Psychiatric/Behavioral: Negative for depression.    Blood pressure 120/72, pulse 93, resp. rate 16, height _0  (1.6 m), weight 127 lb (57.6 kg), SpO2 98 %.Body mass index is 22.5 kg/m.  General Appearance: Casual  Eye Contact::  Fair  Speech:  Slow  Volume:  Decreased  Mood:  fair  Affect:  Fair congruent  Thought Process:  Coherent and intact  Orientation:  Full (Time, Place, and Person)  Thought Content:  Rumination  Suicidal Thoughts:  No  Homicidal Thoughts:  No  Memory:  Immediate;   Fair Recent;   Fair  Judgement:  Fair  Insight:  Shallow  Psychomotor Activity:  Normal  Concentration:  Fair  Recall:  Fair  Akathisia:  Negative  Handed:  Right  AIMS (if indicated):     Assets:  Social Support Vocational/Educational  Sleep:        Assessment: Axis I: Panic disorder. Possible PTSD. Rule out generalized anxiety disorder. Nicotine dependence  Axis II: Deferred  Axis III:  Past Medical History:  Diagnosis Date  . Acid reflux   . Anxiety   . High blood pressure   . High serum parathyroid hormone (PTH) 07/19/2017   96 06/28/2014, 162 12/29/16  . Hypothyroid   . PTSD (post-traumatic stress disorder)     Axis IV: Psychosocial. History of trauma   Treatment Plan and Summary:  Anxiety and GAD: fluctuates depending on stress more so from her daughter continue klonopine and patient isplanning to move out.  Filled again today but this prescription not due till sept 5,   PTSD: baseline. Continue distraction techniques  Panic attacks: infrequent. Continue klonopine . Not want to be on any other meds.  Provided  supportive therapy. fU 2-3 months or earlier if needed  Merian Capron, MD 08/03/2017

## 2017-08-04 ENCOUNTER — Telehealth: Payer: Self-pay

## 2017-08-04 NOTE — Telephone Encounter (Signed)
Pt called to inquire about B12 levels.  Advised pt of values.  She is inquiring as to whether she should continue B12 injections and SL B12.  Please advise.  Claire [name]. Lynel Forester, CMA

## 2017-08-04 NOTE — Telephone Encounter (Signed)
B12 levels look great. You can continue with OTC supplementation. We can recheck in 3 months.

## 2017-08-09 NOTE — Telephone Encounter (Signed)
[place] pt.

## 2017-08-14 ENCOUNTER — Encounter: Payer: Self-pay | Admitting: Physician Assistant

## 2017-08-14 MED ORDER — B-12 500 MCG SL SUBL: 1000.0000 ug | SUBLINGUAL_TABLET | Freq: Every day | SUBLINGUAL | 11 refills | Status: DC

## 2017-08-15 NOTE — Telephone Encounter (Signed)
Pt was notified.  

## 2017-08-28 ENCOUNTER — Other Ambulatory Visit: Payer: Self-pay | Admitting: *Deleted

## 2017-08-28 DIAGNOSIS — J432 Centrilobular emphysema: Secondary | ICD-10-CM

## 2017-08-28 MED ORDER — MOMETASONE FURO-FORMOTEROL FUM 100-5 MCG/ACT IN AERO: 2.0000 | INHALATION_SPRAY | Freq: Two times a day (BID) | RESPIRATORY_TRACT | 0 refills | Status: DC

## 2017-08-30 ENCOUNTER — Telehealth: Payer: Self-pay

## 2017-08-30 DIAGNOSIS — N183 Chronic kidney disease, stage 3 unspecified: Secondary | ICD-10-CM

## 2017-08-30 DIAGNOSIS — D508 Other iron deficiency anemias: Secondary | ICD-10-CM

## 2017-08-30 NOTE — Telephone Encounter (Signed)
Ok for follow up on CKD.

## 2017-08-30 NOTE — Telephone Encounter (Signed)
Patient would like to have BMP ordered. Please advise.

## 2017-08-30 NOTE — Addendum Note (Signed)
Addended by: Chalmers Cater on: 08/30/2017 04:47 PM   Modules accepted: Orders

## 2017-08-30 NOTE — Telephone Encounter (Signed)
Labs ordered and patient advised to go to the lab.

## 2017-08-31 ENCOUNTER — Other Ambulatory Visit: Payer: Self-pay | Admitting: *Deleted

## 2017-08-31 LAB — HEMOGLOBIN: Hemoglobin: 11.2 g/dL — ABNORMAL LOW (ref 11.7–15.5)

## 2017-08-31 MED ORDER — ALBUTEROL SULFATE (2.5 MG/3ML) 0.083% IN NEBU: 2.5000 mg | INHALATION_SOLUTION | RESPIRATORY_TRACT | 6 refills | Status: DC | PRN

## 2017-09-01 LAB — BASIC METABOLIC PANEL WITH GFR
BUN / CREAT RATIO: 14 (calc) (ref 6–22)
BUN: 17 mg/dL (ref 7–25)
CHLORIDE: 94 mmol/L — AB (ref 98–110)
CO2: 31 mmol/L (ref 20–32)
Calcium: 9.9 mg/dL (ref 8.6–10.4)
Creat: 1.24 mg/dL — ABNORMAL HIGH (ref 0.50–0.99)
GFR, EST AFRICAN AMERICAN: 52 mL/min/{1.73_m2} — AB (ref >=60)
GFR, Est Non African American: 45 mL/min/{1.73_m2} — ABNORMAL LOW (ref >=60)
Glucose, Bld: 96 mg/dL (ref 65–99)
POTASSIUM: 4.6 mmol/L (ref 3.5–5.3)
SODIUM: 135 mmol/L (ref 135–146)

## 2017-09-01 NOTE — Telephone Encounter (Signed)
Call pt: hgb improved! Claire [name].  Serum creatine is stable and somewhat improved a little.

## 2017-09-10 ENCOUNTER — Other Ambulatory Visit (HOSPITAL_COMMUNITY): Payer: Self-pay | Admitting: *Deleted

## 2017-09-10 MED ORDER — CLONAZEPAM 0.5 MG PO TABS: ORAL_TABLET | ORAL | 0 refills | Status: DC

## 2017-09-10 NOTE — Telephone Encounter (Signed)
Medication refill- pt lvm requesting a refill for Klonopin. Last refill was printed on 08/03/17. Per Dr. Gilmore Laroche, refill is authorize for Klonopin 0.5mg , #90. Verbal order was provided to Ala (pharmacist) at [facility] on [address]. Pt's next apt is schedule on 10/05/17. Called and informed pt of refill status. Pt verbalizes understanding.

## 2017-09-19 ENCOUNTER — Telehealth: Payer: Self-pay | Admitting: Physician Assistant

## 2017-09-19 DIAGNOSIS — E559 Vitamin D deficiency, unspecified: Secondary | ICD-10-CM

## 2017-09-19 NOTE — Telephone Encounter (Signed)
Pt called. She wants to know if she can get lab order to get vitamin d level checked since she's been forgetting to take her vitamin D meds due to the delay between having to take another med and her vitamin D med. Thank you.

## 2017-09-19 NOTE — Telephone Encounter (Signed)
Lab order placed, left VM advising Pt.

## 2017-09-22 ENCOUNTER — Telehealth: Payer: Self-pay

## 2017-09-22 NOTE — Telephone Encounter (Signed)
Pt is wanting to know if she should have a vit B12 drawn instead of a D .  Please advise.

## 2017-09-25 NOTE — Telephone Encounter (Signed)
2 months ago B12 drawn and great. What are you currently taking of b12? Your vitamin D is what was most recently low.

## 2017-09-26 ENCOUNTER — Ambulatory Visit (INDEPENDENT_AMBULATORY_CARE_PROVIDER_SITE_OTHER): Payer: Medicare Other | Admitting: Physician Assistant

## 2017-09-26 ENCOUNTER — Encounter: Payer: Self-pay | Admitting: Physician Assistant

## 2017-09-26 ENCOUNTER — Other Ambulatory Visit: Payer: Self-pay | Admitting: Physician Assistant

## 2017-09-26 VITALS — BP 104/63 | HR 71 | Ht 63.0 in | Wt 125.0 lb

## 2017-09-26 DIAGNOSIS — N183 Chronic kidney disease, stage 3 unspecified: Secondary | ICD-10-CM

## 2017-09-26 DIAGNOSIS — E559 Vitamin D deficiency, unspecified: Secondary | ICD-10-CM | POA: Diagnosis not present

## 2017-09-26 DIAGNOSIS — D509 Iron deficiency anemia, unspecified: Secondary | ICD-10-CM

## 2017-09-26 DIAGNOSIS — R7989 Other specified abnormal findings of blood chemistry: Secondary | ICD-10-CM | POA: Diagnosis not present

## 2017-09-26 DIAGNOSIS — E538 Deficiency of other specified B group vitamins: Secondary | ICD-10-CM

## 2017-09-26 DIAGNOSIS — J432 Centrilobular emphysema: Secondary | ICD-10-CM

## 2017-09-26 LAB — POCT HEMOGLOBIN: Hemoglobin: 10.3 g/dL — AB (ref 12.2–16.2)

## 2017-09-26 NOTE — Patient Instructions (Addendum)
Stay on vitamin D, calcium, magnesium, sublingual b12, ferrous sulfate. Recheck in 2 months.

## 2017-09-26 NOTE — Progress Notes (Signed)
   Subjective:    Patient ID: Claire [name], female    DOB: [birth year], 66 y.o.   MRN: [id number]  HPI  Pt is a 66 yo female with CKD stage 3 who presents to the clinic for follow up.   IDA- she is taking ferrous sulfate twice a day. She is doing well. No problems or concerns.   B12- she is currently not taking supplement. Last checked 1 month ago at level was at 1000mcg.   Vitamin D def she is taking D3 OTC.   Pt is seeing a nutritionist to make sure she is doing everything to combat her kidney disease. She has stopped drinking sodas. She is walking daily. She continues to smoke.   .. Active Ambulatory Problems    Diagnosis Date Noted  . PTSD (post-traumatic stress disorder) 11/10/2011  . Neurosis, anxiety, panic type 11/10/2011  . Acquired hypothyroidism 11/24/2014  . BP (high blood pressure) 07/24/2012  . Hypothyroid   . Chronic use of benzodiazepine for therapeutic purpose 08/11/2015  . Acute renal failure (HCC) 12/19/2016  . Hyponatremia 12/19/2016  . Hyperkalemia 12/19/2016  . Centrilobular emphysema (HCC) 12/20/2016  . Decreased hemoglobin 12/20/2016  . CKD (chronic kidney disease) stage 3, GFR 30-59 ml/min (HCC) 01/16/2017  . Iron deficiency anemia 01/16/2017  . Acute kidney injury (HCC) 03/17/2017  . Anemia of chronic disease 05/11/2017  . Low serum vitamin B12 05/14/2017  . Vitamin D deficiency 05/14/2017  . Depressed mood 05/14/2017  . High serum parathyroid hormone (PTH) 07/19/2017  . Normocytic anemia 07/19/2017   Resolved Ambulatory Problems    Diagnosis Date Noted  . No Resolved Ambulatory Problems   Past Medical History:  Diagnosis Date  . Acid reflux   . Anxiety   . High blood pressure   . High serum parathyroid hormone (PTH) 07/19/2017  . Hypothyroid   . PTSD (post-traumatic stress disorder)      Review of Systems  All other systems reviewed and are negative.      Objective:   Physical Exam  Constitutional: She is oriented to person, place,  and time. She appears well-developed and well-nourished.  HENT:  Head: Normocephalic and atraumatic.  Cardiovascular: Normal rate, regular rhythm and normal heart sounds.   Pulmonary/Chest: Effort normal and breath sounds normal.  Neurological: She is alert and oriented to person, place, and time.  Psychiatric: She has a normal mood and affect. Her behavior is normal.          Assessment & Plan:  [name].[name].Diagnoses and all orders for this visit:  Iron deficiency anemia, unspecified iron deficiency anemia type -     POCT hemoglobin  CKD (chronic kidney disease) stage 3, GFR 30-59 ml/min (HCC)  Vitamin D deficiency  Low serum vitamin B12    Results for orders placed or performed in visit on 09/26/17  POCT hemoglobin  Result Value Ref Range   Hemoglobin 10.3 (A) 12.2 - 16.2 g/dL   HgB is stable. Pt feels better on ferrous sulfate.  Continue b12 and vitamin D will recheck in 2 months.   CKD is stable. Avoid NSAIDs. Will continue to monitor.   Pt declined flu shot/pneumonia shot. Pt aware of risk.   [name].Spent 30 minutes with patient and greater than 50 percent of visit spent counseling patient regarding treatment plan.

## 2017-09-27 NOTE — Telephone Encounter (Signed)
Pt in office yesterday.

## 2017-09-28 ENCOUNTER — Other Ambulatory Visit: Payer: Self-pay | Admitting: Physician Assistant

## 2017-09-28 DIAGNOSIS — J432 Centrilobular emphysema: Secondary | ICD-10-CM

## 2017-09-29 ENCOUNTER — Other Ambulatory Visit: Payer: Self-pay | Admitting: Physician Assistant

## 2017-09-29 DIAGNOSIS — J432 Centrilobular emphysema: Secondary | ICD-10-CM

## 2017-10-05 ENCOUNTER — Ambulatory Visit (HOSPITAL_COMMUNITY): Payer: Self-pay | Admitting: Psychiatry

## 2017-10-05 ENCOUNTER — Ambulatory Visit (INDEPENDENT_AMBULATORY_CARE_PROVIDER_SITE_OTHER): Payer: Medicare Other | Admitting: Physician Assistant

## 2017-10-05 ENCOUNTER — Encounter: Payer: Self-pay | Admitting: Physician Assistant

## 2017-10-05 VITALS — BP 124/77 | HR 89 | Temp 97.8°F | Wt 127.0 lb

## 2017-10-05 DIAGNOSIS — N3001 Acute cystitis with hematuria: Secondary | ICD-10-CM

## 2017-10-05 LAB — POCT URINALYSIS DIPSTICK
BILIRUBIN UA: NEGATIVE
GLUCOSE UA: NEGATIVE
KETONES UA: NEGATIVE
Nitrite, UA: NEGATIVE
Protein, UA: 30
Spec Grav, UA: <=1.005 — AB (ref 1.010–1.025)
Urobilinogen, UA: 0.2 E.U./dL
pH, UA: 6 (ref 5.0–8.0)

## 2017-10-05 MED ORDER — CEFUROXIME AXETIL 250 MG PO TABS: 250.0000 mg | ORAL_TABLET | Freq: Two times a day (BID) | ORAL | 0 refills | Status: DC

## 2017-10-05 NOTE — Progress Notes (Signed)
HPI:                                                                Claire [name] is a 66 y.o. female who presents to [facility] today for   Dysuria   This is a new problem. The current episode started yesterday. The problem occurs every urination. The problem has been unchanged. The quality of the pain is described as burning. There has been no fever. There is no history of pyelonephritis. Associated symptoms include frequency. Pertinent negatives include no chills, discharge, flank pain, hematuria, nausea or sweats. She has tried nothing for the symptoms. There is no history of kidney stones or recurrent UTIs.     Past Medical History:  Diagnosis Date  . Acid reflux   . Anxiety   . High blood pressure   . High serum parathyroid hormone (PTH) 07/19/2017   96 06/28/2014, 162 12/29/16  . Hypothyroid   . PTSD (post-traumatic stress disorder)    No past surgical history on file. Social History  Substance Use Topics  . Smoking status: Current Every Day Smoker    Packs/day: 0.50    Years: 50.00    Types: Cigarettes  . Smokeless tobacco: Never Used  . Alcohol use No   family history includes Alcohol abuse in her father; Anxiety disorder in her mother and sister; OCD in her daughter.  ROS: negative except as noted in the HPI  Medications: Current Outpatient Prescriptions  Medication Sig Dispense Refill  . albuterol (PROVENTIL HFA;VENTOLIN HFA) 108 (90 Base) MCG/ACT inhaler Inhale 2 puffs into the lungs every 4 (four) hours as needed for wheezing. 2 Inhaler 11  . albuterol (PROVENTIL) (2.5 MG/3ML) 0.083% nebulizer solution Take 3 mLs (2.5 mg total) by nebulization every 4 (four) hours as needed for wheezing or shortness of breath. 30 vial 6  . atenolol (TENORMIN) 50 MG tablet Take 1.5 tablets (75 mg total) by mouth daily. 135 tablet 0  . Calcium Carbonate-Vitamin D (CALCIUM-VITAMIN D) 500-200 MG-UNIT per tablet Take 1 tablet by mouth  daily.    . cefUROXime (CEFTIN) 250 MG tablet Take 1 tablet (250 mg total) by mouth 2 (two) times daily with a meal. 14 tablet 0  . clonazePAM (KLONOPIN) 0.5 MG tablet take 1 tablet by mouth three times a day if needed 90 tablet 0  . Cobalamine Combinations (B-12) [phone] MCG SUBL Place under the tongue.    . Cyanocobalamin (B-12) 500 MCG SUBL Place 1,000 mcg under the tongue daily. 60 tablet 11  . DULERA 100-5 MCG/ACT AERO TAKE 2 PUFFS BY MOUTH TWICE A DAY 13 Inhaler 0  . ferrous sulfate 325 (65 FE) MG EC tablet 1 tab PO TID every other day 90 tablet 11  . levothyroxine (SYNTHROID, LEVOTHROID) 75 MCG tablet Take 1 tablet (75 mcg total) by mouth daily. 75 tablet 1  . MAGNESIUM PO Take by mouth.    . ranitidine (ZANTAC) 150 MG tablet Take 1 tablet (150 mg total) by mouth 2 (two) times daily. 60 tablet 11   No current facility-administered medications for this visit.    Allergies  Allergen Reactions  . Macrodantin [Nitrofurantoin Macrocrystal] Rash  . Bactrim [Sulfamethoxazole-Trimethoprim]     ACUTE RENAL fAILURE/HYPONATREMIA/HYPERKALEMIA  .  Zithromax [Azithromycin]        Objective:  BP 124/77   Pulse 89   Temp 97.8 F (36.6 C) (Oral)   Wt 127 lb (57.6 kg)   BMI 22.50 kg/m  Gen:  alert, not ill-appearing, no distress, appropriate for age HEENT: head normocephalic without obvious abnormality, conjunctiva and cornea clear, trachea midline Pulm: Normal work of breathing, normal phonation, clear to auscultation bilaterally CV: Normal rate, regular rhythm, s1 and s2 distinct, no murmurs, clicks or rubs  GI: suprapubic tenderness, no CVA tenderness Neuro: alert and oriented x 3, no tremor MSK: extremities atraumatic, normal gait and station Skin: intact, no rashes on exposed skin, no jaundice, no cyanosis      Results for orders placed or performed in visit on 10/05/17 (from the past 72 hour(s))  POCT Urinalysis Dipstick     Status: Abnormal   Collection Time: 10/05/17  4:32  PM  Result Value Ref Range   Color, UA yellow    Clarity, UA cloudy    Glucose, UA negative    Bilirubin, UA negative    Ketones, UA negative    Spec Grav, UA <=1.005 (A) 1.010 - 1.025   Blood, UA moderate    pH, UA 6.0 5.0 - 8.0   Protein, UA 30    Urobilinogen, UA 0.2 0.2 or 1.0 E.U./dL   Nitrite, UA negaitve    Leukocytes, UA Moderate (2+) (A) Negative   No results found.    Assessment and Plan: 66 y.o. female with   1. Acute cystitis with hematuria - POCT UA positive for moderate blood and mod leukocytes - urine culture pending. Treating empirically with Ceftin CrCl>30, no dosage adjustment necessary - cefUROXime (CEFTIN) 250 MG tablet; Take 1 tablet (250 mg total) by mouth 2 (two) times daily with a meal.  Dispense: 14 tablet; Refill: 0  Lab Results  Component Value Date   CREATININE 1.24 (H) 08/31/2017    Patient education and anticipatory guidance given Patient agrees with treatment plan Follow-up as needed if symptoms worsen or fail to improve  Levonne Hubertharley E. Martavion Couper PA-C

## 2017-10-05 NOTE — Patient Instructions (Signed)

## 2017-10-06 ENCOUNTER — Telehealth: Payer: Self-pay | Admitting: Physician Assistant

## 2017-10-06 DIAGNOSIS — N3001 Acute cystitis with hematuria: Secondary | ICD-10-CM

## 2017-10-06 MED ORDER — CIPROFLOXACIN HCL 250 MG PO TABS: 250.0000 mg | ORAL_TABLET | Freq: Two times a day (BID) | ORAL | 0 refills | Status: DC

## 2017-10-06 NOTE — Telephone Encounter (Signed)
Pt called clinic to say the Rx for cefUROXime (CEFTIN) 250 MG tablet made her very nauseous. Requesting a new Rx be sent in. Pt states she has used Cipro in the past and that helped.

## 2017-10-06 NOTE — Telephone Encounter (Signed)
Rx sent 

## 2017-10-08 LAB — URINE CULTURE
MICRO NUMBER: 81227833
SPECIMEN QUALITY: ADEQUATE

## 2017-10-09 ENCOUNTER — Encounter: Payer: Self-pay | Admitting: Physician Assistant

## 2017-10-09 ENCOUNTER — Ambulatory Visit (INDEPENDENT_AMBULATORY_CARE_PROVIDER_SITE_OTHER): Payer: Medicare Other | Admitting: Physician Assistant

## 2017-10-09 ENCOUNTER — Ambulatory Visit (HOSPITAL_COMMUNITY): Payer: Self-pay | Admitting: Psychiatry

## 2017-10-09 VITALS — BP 150/84 | HR 67 | Wt 127.2 lb

## 2017-10-09 DIAGNOSIS — R350 Frequency of micturition: Secondary | ICD-10-CM | POA: Diagnosis not present

## 2017-10-09 MED ORDER — MECLIZINE HCL 25 MG PO TABS: 25.0000 mg | ORAL_TABLET | Freq: Two times a day (BID) | ORAL | 0 refills | Status: DC | PRN

## 2017-10-09 NOTE — Progress Notes (Signed)
HPI:                                                                Claire [name] is a 66 y.o. female who [facility] today for suprapubic pressure  Patient with recent uncomplicated cystitis presents today with persistent suprapubic pressure and urinary frequency. Her recent urine culture was positive for E. Coli and sensitive to Cipro and Cephalosporins. She was originally prescribed Ceftin, but reported intolerable nausea and dizziness. She requested Cipro and completed the recommended 3 day course for uncomplicated UTI. She reports dysuria resolved, but she still feels like she may have a UTI. No fever, chills, gross hematuria, flank pain.  Past Medical History:  Diagnosis Date  . Acid reflux   . Anxiety   . High blood pressure   . High serum parathyroid hormone (PTH) 07/19/2017   96 06/28/2014, 162 12/29/16  . Hypothyroid   . PTSD (post-traumatic stress disorder)    No past surgical history on file. Social History   Tobacco Use  . Smoking status: Current Every Day Smoker    Packs/day: 0.50    Years: 50.00    Pack years: 25.00    Types: Cigarettes  . Smokeless tobacco: Never Used  Substance Use Topics  . Alcohol use: No   family history includes Alcohol abuse in her father; Anxiety disorder in her mother and sister; OCD in her daughter.  ROS: negative except as noted in the HPI  Medications: Current Outpatient Medications  Medication Sig Dispense Refill  . albuterol (PROVENTIL HFA;VENTOLIN HFA) 108 (90 Base) MCG/ACT inhaler Inhale 2 puffs into the lungs every 4 (four) hours as needed for wheezing. 2 Inhaler 11  . albuterol (PROVENTIL) (2.5 MG/3ML) 0.083% nebulizer solution Take 3 mLs (2.5 mg total) by nebulization every 4 (four) hours as needed for wheezing or shortness of breath. 30 vial 6  . atenolol (TENORMIN) 50 MG tablet Take 1.5 tablets (75 mg total) by mouth daily. 135 tablet 0  . Calcium Carbonate-Vitamin  D (CALCIUM-VITAMIN D) 500-200 MG-UNIT per tablet Take 1 tablet by mouth daily.    . ciprofloxacin (CIPRO) 250 MG tablet Take 1 tablet (250 mg total) by mouth 2 (two) times daily. 6 tablet 0  . clonazePAM (KLONOPIN) 0.5 MG tablet take 1 tablet by mouth three times a day if needed 90 tablet 0  . Cobalamine Combinations (B-12) [phone] MCG SUBL Place under the tongue.    . Cyanocobalamin (B-12) 500 MCG SUBL Place 1,000 mcg under the tongue daily. 60 tablet 11  . DULERA 100-5 MCG/ACT AERO TAKE 2 PUFFS BY MOUTH TWICE A DAY 13 Inhaler 0  . ferrous sulfate 325 (65 FE) MG EC tablet 1 tab PO TID every other day 90 tablet 11  . levothyroxine (SYNTHROID, LEVOTHROID) 75 MCG tablet Take 1 tablet (75 mcg total) by mouth daily. 75 tablet 1  . MAGNESIUM PO Take by mouth.    . ranitidine (ZANTAC) 150 MG tablet Take 1 tablet (150 mg total) by mouth 2 (two) times daily. 60 tablet 11  . meclizine (ANTIVERT) 25 MG tablet Take 1 tablet (25 mg total) 2 (two) times daily as needed by mouth for dizziness or nausea. 10 tablet 0   No current  facility-administered medications for this visit.    Allergies  Allergen Reactions  . Macrodantin [Nitrofurantoin Macrocrystal] Rash  . Bactrim [Sulfamethoxazole-Trimethoprim]     ACUTE RENAL fAILURE/HYPONATREMIA/HYPERKALEMIA  . Zithromax [Azithromycin]        Objective:  BP (!) 150/84   Pulse 67   Wt 127 lb 4 oz (57.7 kg)   SpO2 96%   BMI 22.54 kg/m  Gen:  alert, not ill-appearing, no distress, appropriate for age HEENT: head normocephalic without obvious abnormality, conjunctiva and cornea clear, trachea midline Pulm: Normal work of breathing, normal phonation  Neuro: alert and oriented x 3, no tremor MSK: extremities atraumatic, normal gait and station Skin: intact, no rashes on exposed skin, no jaundice, no cyanosis Psych: well-groomed, cooperative, good eye contact, anxious, speech is articulate, and thought processes clear and goal-directed  Depression screen  [facility] 2/9 09/26/2017  Decreased Interest 0  Down, Depressed, Hopeless 0  PHQ - 2 Score 0  Some encounter information is confidential and restricted. Go to Review Flowsheets activity to see all data.     No results found for this or any previous visit (from the past 72 hour(s)). No results found.    Assessment and Plan: 66 y.o. female with   1. Urinary frequency - personally reviewed urine culture from 10/05/17. Patient was given the following treatment options for her residual symptoms 1. Injection of Ceftriaxone 2. Oral cephalosporin, such as Keflex 3. Another antibiotic, such as Fosfomycin She has a history of AKI with Bactrim and reports rash with Nitrofurantoin.  Patient was insistent on receiving a longer course of Ciprofloxacin. Explained that Ciprofloxacin is not first line due to resistance and that her continued symptoms likely represent failure of the antibiotic or another cause of her symptoms, such as atrophic vaginitis. She was invited to give a urine sample; explained that a positive result would indicate failure of Cipro, while a negative result could be falsely negative due to recent antibiotic use. Explained that either way, prescribing more Cipro is not the appropriate treatment. Patient opted to complete Ceftin and requested medication for nausea/dizziness. A prescription was written for Meclizine because patient has tolerated Dramamine in the past and does not want any new medications due to anxiety/fear of adverse reaction.   Patient education and anticipatory guidance given Patient agrees with treatment plan Follow-up as needed if symptoms worsen or fail to improve  [name] PA-C

## 2017-10-09 NOTE — Telephone Encounter (Signed)
Pt took Rx. Still having symptoms. Symptoms are not gone, appt made for today.

## 2017-10-10 NOTE — Telephone Encounter (Signed)
Rinaldo Cloudamela called into the office to speak with me about her visit yesterday with Vinetta Bergamoharley.  Rinaldo Cloudamela was seen on 10/05/17 by Vinetta Bergamoharley and was given a 3 day supply of Cipro.  Patient came back in on 10/09/17 due to still have some burning and pressure.  Rinaldo Cloudamela was very dissatisfied with her visit because she feels like Vinetta BergamoCharley did not listen to her.  Vinetta BergamoCharley wanted her to take Ceftin and Rinaldo Cloudamela had tried Ceftin and had side effects with it which were nausea and vomiting.  She discontinued the Ceftin and Charley prescribed Cipro last week.  Rinaldo Cloudamela states that her symptoms are much better but is still having some pressure when she urinates.  She ask Vinetta BergamoCharley for an additional dosage of Cipro and Vinetta BergamoCharley would not prescribe it.  Patient would like for you to call her at [phone].

## 2017-10-11 ENCOUNTER — Ambulatory Visit (INDEPENDENT_AMBULATORY_CARE_PROVIDER_SITE_OTHER): Payer: Medicare Other | Admitting: Psychiatry

## 2017-10-11 ENCOUNTER — Encounter (HOSPITAL_COMMUNITY): Payer: Self-pay | Admitting: Psychiatry

## 2017-10-11 VITALS — BP 148/84 | HR 72 | Resp 16 | Ht 63.0 in | Wt 125.0 lb

## 2017-10-11 DIAGNOSIS — F410 Panic disorder [episodic paroxysmal anxiety] without agoraphobia: Secondary | ICD-10-CM

## 2017-10-11 DIAGNOSIS — F4310 Post-traumatic stress disorder, unspecified: Secondary | ICD-10-CM

## 2017-10-11 DIAGNOSIS — F411 Generalized anxiety disorder: Secondary | ICD-10-CM

## 2017-10-11 MED ORDER — CIPROFLOXACIN HCL 500 MG PO TABS: 500.0000 mg | ORAL_TABLET | Freq: Two times a day (BID) | ORAL | 0 refills | Status: DC

## 2017-10-11 MED ORDER — CLONAZEPAM 0.5 MG PO TABS: ORAL_TABLET | ORAL | 1 refills | Status: DC

## 2017-10-11 NOTE — Progress Notes (Signed)
Patient ID: Claire [name], female   DOB: [birth year], 66 y.o.   MRN: [id number]  [name] [id number] 66 y.o.  10/11/2017 1:45 PM  Chief Complaint:  Establish care for panic attacks  History of Present Illness:   Patient Presents for follow up and medication management for  panic symptoms. Diagnosed with PTSD, panic symptoms and GAD.  Still has conflicts with daughter and arguments from her upset her.  klonopine helps. She tries to keep busy Infrequent panic attacks Modifying factor: breahting techniques. grandkids   PTSD is related to her past some flashbacks. Distraction helps Depression is baseline No side effects     Aggravating factors:husband illness. Living with daughter   No psychosis  Medical History; Past Medical History:  Diagnosis Date  . Acid reflux   . Anxiety   . High blood pressure   . High serum parathyroid hormone (PTH) 07/19/2017   96 06/28/2014, 162 12/29/16  . Hypothyroid   . PTSD (post-traumatic stress disorder)     Allergies: Allergies  Allergen Reactions  . Macrodantin [Nitrofurantoin Macrocrystal] Rash  . Bactrim [Sulfamethoxazole-Trimethoprim]     ACUTE RENAL fAILURE/HYPONATREMIA/HYPERKALEMIA  . Zithromax [Azithromycin]     Medications: Outpatient Encounter Medications as of 10/11/2017  Medication Sig  . albuterol (PROVENTIL HFA;VENTOLIN HFA) 108 (90 Base) MCG/ACT inhaler Inhale 2 puffs into the lungs every 4 (four) hours as needed for wheezing.  [name] albuterol (PROVENTIL) (2.5 MG/3ML) 0.083% nebulizer solution Take 3 mLs (2.5 mg total) by nebulization every 4 (four) hours as needed for wheezing or shortness of breath.  [name] atenolol (TENORMIN) 50 MG tablet Take 1.5 tablets (75 mg total) by mouth daily.  . Calcium Carbonate-Vitamin D (CALCIUM-VITAMIN D) 500-200 MG-UNIT per tablet Take 1 tablet by mouth daily.  . cefUROXime (CEFTIN) 250 MG tablet Take 250 mg by mouth.  . ciprofloxacin  (CIPRO) 500 MG tablet Take 1 tablet (500 mg total) 2 (two) times daily by mouth.  . clonazePAM (KLONOPIN) 0.5 MG tablet take 1 tablet by mouth three times a day if needed  . Cobalamine Combinations (B-12) [phone] MCG SUBL Place under the tongue.  . Cyanocobalamin (B-12) 500 MCG SUBL Place 1,000 mcg under the tongue daily.  . DULERA 100-5 MCG/ACT AERO TAKE 2 PUFFS BY MOUTH TWICE A DAY  . ferrous sulfate 325 (65 FE) MG EC tablet 1 tab PO TID every other day  . levothyroxine (SYNTHROID, LEVOTHROID) 75 MCG tablet Take 1 tablet (75 mcg total) by mouth daily.  [name] MAGNESIUM PO Take by mouth.  . meclizine (ANTIVERT) 25 MG tablet Take 1 tablet (25 mg total) 2 (two) times daily as needed by mouth for dizziness or nausea.  . ranitidine (ZANTAC) 150 MG tablet Take 1 tablet (150 mg total) by mouth 2 (two) times daily.  . [DISCONTINUED] clonazePAM (KLONOPIN) 0.5 MG tablet take 1 tablet by mouth three times a day if needed   No facility-administered encounter medications on file as of 10/11/2017.     Family History; Family History  Problem Relation Age of Onset  . Anxiety disorder Mother   . OCD Daughter   . Alcohol abuse Father   . Anxiety disorder Sister        Labs:  Recent Results (from the past 2160 hour(s))  B12     Status: None   Collection Time: 07/19/17  2:46 PM  Result Value Ref Range   Vitamin B-12 1,030 200 - 1,100 pg/mL  Methylmalonic Acid  Status: None   Collection Time: 07/19/17  2:46 PM  Result Value Ref Range   Methylmalonic Acid, Quant 307 87 - 318 nmol/L  Folate     Status: None   Collection Time: 07/19/17  2:46 PM  Result Value Ref Range   Folate 13.1 >5.4 ng/mL    Comment: Reference Range >17 years:   Low: <3.4 ng/mL              Borderline: 3.4-5.4 ng/mL              Normal: >5.4 ng/mL     Hemoglobin     Status: Abnormal   Collection Time: 07/19/17  2:46 PM  Result Value Ref Range   Hemoglobin 10.8 (L) 11.7 - 15.5 g/dL  Ferritin     Status: None    Collection Time: 07/19/17  2:47 PM  Result Value Ref Range   Ferritin 84 20 - 288 ng/mL  Iron and TIBC     Status: None   Collection Time: 07/19/17  2:47 PM  Result Value Ref Range   Iron 49 45 - 160 ug/dL   UIBC 161 ug/dL   TIBC 096 045 - 409 ug/dL   %SAT 18 11 - 50 %  TSH     Status: None   Collection Time: 07/19/17  2:49 PM  Result Value Ref Range   TSH 2.07 mIU/L    Comment:   Reference Range   > or = 20 Years  0.40-4.50   Pregnancy Range First trimester  0.26-2.66 Second trimester 0.55-2.73 Third trimester  0.43-2.91     VITAMIN D 25 Hydroxy (Vit-D Deficiency, Fractures)     Status: None   Collection Time: 07/19/17  2:49 PM  Result Value Ref Range   Vit D, 25-Hydroxy 50 30 - 100 ng/mL    Comment: Vitamin D Status           25-OH Vitamin D        Deficiency                <20 ng/mL        Insufficiency         20 - 29 ng/mL        Optimal             > or = 30 ng/mL   For 25-OH Vitamin D testing on patients on D2-supplementation and patients for whom quantitation of D2 and D3 fractions is required, the QuestAssureD 25-OH VIT D, (D2,D3), LC/MS/MS is recommended: order code 81191 (patients > 2 yrs).   BASIC METABOLIC PANEL WITH GFR     Status: Abnormal   Collection Time: 08/31/17  1:50 PM  Result Value Ref Range   Glucose, Bld 96 65 - 99 mg/dL    Comment: .            Fasting reference interval .    BUN 17 7 - 25 mg/dL   Creat 4.78 (H) 2.95 - 0.99 mg/dL    Comment: For patients >3 years of age, the reference limit for Creatinine is approximately 13% higher for people identified as African-American. .    GFR, Est Non African American 45 (L) > OR = 60 mL/min/1.68m2   GFR, Est African American 52 (L) > OR = 60 mL/min/1.96m2   BUN/Creatinine Ratio 14 6 - 22 (calc)   Sodium 135 135 - 146 mmol/L   Potassium 4.6 3.5 - 5.3 mmol/L   Chloride 94 (L) 98 - 110 mmol/L  CO2 31 20 - 32 mmol/L   Calcium 9.9 8.6 - 10.4 mg/dL  Hemoglobin     Status: Abnormal    Collection Time: 08/31/17  1:51 PM  Result Value Ref Range   Hemoglobin 11.2 (L) 11.7 - 15.5 g/dL  POCT hemoglobin     Status: Abnormal   Collection Time: 09/26/17  3:59 PM  Result Value Ref Range   Hemoglobin 10.3 (A) 12.2 - 16.2 g/dL  POCT Urinalysis Dipstick     Status: Abnormal   Collection Time: 10/05/17  4:32 PM  Result Value Ref Range   Color, UA yellow    Clarity, UA cloudy    Glucose, UA negative    Bilirubin, UA negative    Ketones, UA negative    Spec Grav, UA <=1.005 (A) 1.010 - 1.025   Blood, UA moderate    pH, UA 6.0 5.0 - 8.0   Protein, UA 30    Urobilinogen, UA 0.2 0.2 or 1.0 E.U./dL   Nitrite, UA negaitve    Leukocytes, UA Moderate (2+) (A) Negative  Urine Culture     Status: Abnormal   Collection Time: 10/05/17  4:32 PM  Result Value Ref Range   MICRO NUMBER: [id number]    SPECIMEN QUALITY: ADEQUATE    Sample Source URINE    STATUS: FINAL    ISOLATE 1: Escherichia coli (A)       Susceptibility   Escherichia coli - URINE CULTURE, REFLEX    AMOX/CLAVULANIC 4 Sensitive     AMPICILLIN 8 Sensitive     AMPICILLIN/SULBACTAM 4 Sensitive     CEFAZOLIN* <=4 Not Reportable      * For infections other than uncomplicated UTIcaused by E. coli, K. pneumoniae or P. mirabilis:Cefazolin is resistant if MIC > or = 8 mcg/mL.(Distinguishing susceptible versus intermediatefor isolates with MIC < or = 4 mcg/mL requiresadditional testing.)For uncomplicated UTI caused by E. coli,K. pneumoniae or P. mirabilis: Cefazolin issusceptible if MIC <32 mcg/mL and predictssusceptible to the oral agents cefaclor, cefdinir,cefpodoxime, cefprozil, cefuroxime, cephalexinand loracarbef.    CEFEPIME <=1 Sensitive     CEFTRIAXONE <=1 Sensitive     CIPROFLOXACIN <=0.25 Sensitive     LEVOFLOXACIN <=0.12 Sensitive     ERTAPENEM <=0.5 Sensitive     GENTAMICIN <=1 Sensitive     IMIPENEM <=0.25 Sensitive     NITROFURANTOIN <=16 Sensitive     PIP/TAZO <=4 Sensitive     TOBRAMYCIN <=1 Sensitive      TRIMETH/SULFA* <=20 Sensitive      * For infections other than uncomplicated UTIcaused by E. coli, K. pneumoniae or P. mirabilis:Cefazolin is resistant if MIC > or = 8 mcg/mL.(Distinguishing susceptible versus intermediatefor isolates with MIC < or = 4 mcg/mL requiresadditional testing.)For uncomplicated UTI caused by E. coli,K. pneumoniae or P. mirabilis: Cefazolin issusceptible if MIC <32 mcg/mL and predictssusceptible to the oral agents cefaclor, cefdinir,cefpodoxime, cefprozil, cefuroxime, cephalexinand loracarbef.Legend:S = Susceptible  I = IntermediateR = Resistant  NS = Not susceptible* = Not tested  NR = Not reported**NN = See antimicrobic comments      Mental Status Examination;   Psychiatric Specialty Exam: Physical Exam  Constitutional: She appears well-nourished.  Skin: She is not diaphoretic.    Review of Systems  Constitutional: Negative for fever.  Cardiovascular: Negative for palpitations.  Gastrointestinal: Negative for nausea.  Skin: Negative for rash.  Neurological: Negative for tremors and headaches.  Psychiatric/Behavioral: Negative for depression and substance abuse.    Blood pressure (!) 148/84, pulse 72, resp. rate 16, height 5'  3" (1.6 m), weight 125 lb (56.7 kg), SpO2 90 %.Body mass index is 22.14 kg/m.  General Appearance: Casual  Eye Contact::  Fair  Speech:  Slow  Volume:  Decreased  Mood:  euthymic  Affect:  congruent  Thought Process:  Coherent and intact  Orientation:  Full (Time, Place, and Person)  Thought Content:  Rumination  Suicidal Thoughts:  No  Homicidal Thoughts:  No  Memory:  Immediate;   Fair Recent;   Fair  Judgement:  Fair  Insight:  Shallow  Psychomotor Activity:  Normal  Concentration:  Fair  Recall:  Fair  Akathisia:  Negative  Handed:  Right  AIMS (if indicated):     Assets:  Social Support Vocational/Educational  Sleep:        Assessment: Axis I: Panic disorder. Possible PTSD. Rule out generalized anxiety disorder.  Nicotine dependence  Axis II: Deferred  Axis III:  Past Medical History:  Diagnosis Date  . Acid reflux   . Anxiety   . High blood pressure   . High serum parathyroid hormone (PTH) 07/19/2017   96 06/28/2014, 162 12/29/16  . Hypothyroid   . PTSD (post-traumatic stress disorder)     Axis IV: Psychosocial. History of trauma   Treatment Plan and Summary:  Anxiety and GAD: baseline. Daughter arguments exacerbate her anxiety. Provided supportive therapy. Continue klonopine but caution not to increase dose. PTSD: baseline. continuie disraction techniques   Panic attacks: infrequent. klonopine helps. Does not want to be on SSRI  FU 2-3 months  Thresa RossAKHTAR, Pj Zehner, MD 10/11/2017

## 2017-10-11 NOTE — Telephone Encounter (Signed)
Called patient:   Discussed with patient that provider felt like not responding to cipro was a treatment failure.provider likely did not know about her sensitivities to abx and hx of AKI. Pt tolerates cipro with no adverse side effects therefore, I would like to continue with cipro. Since culture was sensitive to cipro it is possible that she did not have long enough. I am going to extend for 4 days and have patient come in for repeat culture.

## 2017-10-13 ENCOUNTER — Ambulatory Visit: Payer: Self-pay | Admitting: Physician Assistant

## 2017-10-16 ENCOUNTER — Ambulatory Visit: Payer: Self-pay | Admitting: Physician Assistant

## 2017-10-17 ENCOUNTER — Ambulatory Visit (INDEPENDENT_AMBULATORY_CARE_PROVIDER_SITE_OTHER): Payer: Medicare Other | Admitting: Physician Assistant

## 2017-10-17 ENCOUNTER — Encounter: Payer: Self-pay | Admitting: Physician Assistant

## 2017-10-17 VITALS — BP 144/68 | HR 69 | Ht 62.99 in | Wt 127.0 lb

## 2017-10-17 DIAGNOSIS — N3001 Acute cystitis with hematuria: Secondary | ICD-10-CM | POA: Diagnosis not present

## 2017-10-17 LAB — POCT URINALYSIS DIPSTICK
Bilirubin, UA: NEGATIVE
Glucose, UA: NEGATIVE
Ketones, UA: NEGATIVE
NITRITE UA: NEGATIVE
PROTEIN UA: NEGATIVE
RBC UA: NEGATIVE
SPEC GRAV UA: 1.015 (ref 1.010–1.025)
UROBILINOGEN UA: 0.2 U/dL
pH, UA: 5.5 (ref 5.0–8.0)

## 2017-10-17 NOTE — Progress Notes (Signed)
   Subjective:    Patient ID: Carl Bestamela L Pettis, female    DOB: Nov 08, 1951, 66 y.o.   MRN: [id number]  HPI Pt is a 66 yo female who presents to the clinic to follow up on UTI. She finished 7 days of cipro yesterday. She denies any fever, chills, flank pain, frequency, abdominal pain, dysuria. She wants to confirm infection has cleared.   .. Active Ambulatory Problems    Diagnosis Date Noted  . PTSD (post-traumatic stress disorder) 11/10/2011  . Neurosis, anxiety, panic type 11/10/2011  . Acquired hypothyroidism 11/24/2014  . BP (high blood pressure) 07/24/2012  . Hypothyroid   . Chronic use of benzodiazepine for therapeutic purpose 08/11/2015  . Hyponatremia 12/19/2016  . Hyperkalemia 12/19/2016  . Centrilobular emphysema (HCC) 12/20/2016  . Decreased hemoglobin 12/20/2016  . CKD (chronic kidney disease) stage 3, GFR 30-59 ml/min (HCC) 01/16/2017  . Iron deficiency anemia 01/16/2017  . Anemia of chronic disease 05/11/2017  . Low serum vitamin B12 05/14/2017  . Vitamin D deficiency 05/14/2017  . Depressed mood 05/14/2017  . High serum parathyroid hormone (PTH) 07/19/2017  . Normocytic anemia 07/19/2017   Resolved Ambulatory Problems    Diagnosis Date Noted  . Acute renal failure (HCC) 12/19/2016  . Acute kidney injury (HCC) 03/17/2017   Past Medical History:  Diagnosis Date  . Acid reflux   . Anxiety   . High blood pressure   . High serum parathyroid hormone (PTH) 07/19/2017  . Hypothyroid   . PTSD (post-traumatic stress disorder)       Review of Systems  All other systems reviewed and are negative.      Objective:   Physical Exam  Constitutional: She is oriented to person, place, and time. She appears well-developed and well-nourished.  HENT:  Head: Normocephalic and atraumatic.  Cardiovascular: Normal rate, regular rhythm and normal heart sounds.  Pulmonary/Chest: Effort normal and breath sounds normal.  No CVA tenderness.   Abdominal: Soft. Bowel sounds are  normal. She exhibits no distension and no mass. There is no tenderness. There is no rebound and no guarding.  Neurological: She is alert and oriented to person, place, and time.  Psychiatric: She has a normal mood and affect. Her behavior is normal.          Assessment & Plan:  [name].[name].Rinaldo Cloudamela was seen today for follow-up.  Diagnoses and all orders for this visit:  Acute cystitis with hematuria -     Urine Culture -     POCT urinalysis dipstick   .[name]. Results for orders placed or performed in visit on 10/17/17  POCT urinalysis dipstick  Result Value Ref Range   Color, UA yellow    Clarity, UA clear    Glucose, UA neg    Bilirubin, UA neg    Ketones, UA neg    Spec Grav, UA 1.015 1.010 - 1.025   Blood, UA neg    pH, UA 5.5 5.0 - 8.0   Protein, UA neg    Urobilinogen, UA 0.2 0.2 or 1.0 E.U./dL   Nitrite, UA neg    Leukocytes, UA Trace (A) Negative   Pt is asymptomatic today. Will culture to confirm cleared. Follow up as needed.

## 2017-10-18 LAB — URINE CULTURE
MICRO NUMBER: 81279416
Result:: NO GROWTH
SPECIMEN QUALITY:: ADEQUATE

## 2017-11-03 NOTE — Progress Notes (Signed)
This encounter was created in error - please disregard.

## 2017-11-06 ENCOUNTER — Telehealth (HOSPITAL_COMMUNITY): Payer: Self-pay | Admitting: Psychiatry

## 2017-11-06 NOTE — Telephone Encounter (Signed)
Pt needs refill on klonopin. [facility]

## 2017-11-07 MED ORDER — CLONAZEPAM 0.5 MG PO TABS: ORAL_TABLET | ORAL | 1 refills | Status: DC

## 2017-11-07 NOTE — Telephone Encounter (Signed)
Can send ok.

## 2017-11-07 NOTE — Telephone Encounter (Signed)
Per Dr. Gilmore LarocheAkhtar, Clonazepam was called in to pharmacy for a one month supply. Patient notified. Nothing further is needed at this time.

## 2017-11-10 ENCOUNTER — Telehealth: Payer: Self-pay | Admitting: Physician Assistant

## 2017-11-10 NOTE — Telephone Encounter (Signed)
Pt called clinic requesting an antibiotic. She is not sick. She said the people who live with her have a cold, and she doesn't want to get it. She is especially concerned because it is getting ready to snow. Pt also states she does not get any immunizations, because she never has before.  Pt reports she is wearing a mask when she has to leave her room, and they are not allowed to come into her room. Advised Pt we cannot send an antibiotic as a prevention from the cold. Informed her to keep her hands clean with soap and water, and continue wearing her mask. She will contact us if she does develop a cold and we can get her an appointment. No further questions.

## 2017-11-10 NOTE — Telephone Encounter (Signed)
Advised Pt of those recommendations and she cannot go to the store. She will call clinic if needed for an appointment next week.

## 2017-11-10 NOTE — Telephone Encounter (Signed)
She can start airborne and suck on zinc tablets to help prevention.

## 2017-11-14 ENCOUNTER — Other Ambulatory Visit: Payer: Self-pay | Admitting: *Deleted

## 2017-11-14 DIAGNOSIS — J432 Centrilobular emphysema: Secondary | ICD-10-CM

## 2017-11-14 MED ORDER — MOMETASONE FURO-FORMOTEROL FUM 100-5 MCG/ACT IN AERO: INHALATION_SPRAY | RESPIRATORY_TRACT | 0 refills | Status: DC

## 2017-11-17 ENCOUNTER — Ambulatory Visit: Payer: Self-pay

## 2017-11-22 ENCOUNTER — Ambulatory Visit (INDEPENDENT_AMBULATORY_CARE_PROVIDER_SITE_OTHER): Payer: Medicare Other | Admitting: Physician Assistant

## 2017-11-22 ENCOUNTER — Encounter: Payer: Self-pay | Admitting: Physician Assistant

## 2017-11-22 VITALS — BP 124/74 | HR 94 | Ht 63.0 in | Wt 125.0 lb

## 2017-11-22 DIAGNOSIS — R82998 Other abnormal findings in urine: Secondary | ICD-10-CM | POA: Diagnosis not present

## 2017-11-22 DIAGNOSIS — R3915 Urgency of urination: Secondary | ICD-10-CM

## 2017-11-22 DIAGNOSIS — J441 Chronic obstructive pulmonary disease with (acute) exacerbation: Secondary | ICD-10-CM

## 2017-11-22 DIAGNOSIS — J432 Centrilobular emphysema: Secondary | ICD-10-CM

## 2017-11-22 LAB — POCT URINALYSIS DIPSTICK
BILIRUBIN UA: NEGATIVE
Blood, UA: NEGATIVE
Glucose, UA: NEGATIVE
KETONES UA: NEGATIVE
NITRITE UA: NEGATIVE
PH UA: 7 (ref 5.0–8.0)
PROTEIN UA: NEGATIVE
SPEC GRAV UA: 1.01 (ref 1.010–1.025)
UROBILINOGEN UA: 0.2 U/dL

## 2017-11-22 MED ORDER — FLUTICASONE-UMECLIDIN-VILANT 100-62.5-25 MCG/INH IN AEPB: 1.0000 | INHALATION_SPRAY | Freq: Every day | RESPIRATORY_TRACT | 5 refills | Status: DC

## 2017-11-22 MED ORDER — PREDNISONE 20 MG PO TABS: ORAL_TABLET | ORAL | 0 refills | Status: DC

## 2017-11-22 NOTE — Patient Instructions (Signed)
Chronic Obstructive Pulmonary Disease Exacerbation  Chronic obstructive pulmonary disease (COPD) is a common lung problem. In COPD, the flow of air from the lungs is limited. COPD exacerbations are times that breathing gets worse and you need extra treatment. Without treatment they can be life threatening. If they happen often, your lungs can become more damaged. If your COPD gets worse, your doctor may treat you with:  ? Medicines.  ? Oxygen.  ? Different ways to clear your airway, such as using a mask.    Follow these instructions at home:  ? Do not smoke.  ? Avoid tobacco smoke and other things that bother your lungs.  ? If given, take your antibiotic medicine as told. Finish the medicine even if you start to feel better.  ? Only take medicines as told by your doctor.  ? Drink enough fluids to keep your pee (urine) clear or pale yellow (unless your doctor has told you not to).  ? Use a cool mist machine (vaporizer).  ? If you use oxygen or a machine that turns liquid medicine into a mist (nebulizer), continue to use them as told.  ? Keep up with shots (vaccinations) as told by your doctor.  ? Exercise regularly.  ? Eat healthy foods.  ? Keep all doctor visits as told.  Get help right away if:  ? You are very short of breath and it gets worse.  ? You have trouble talking.  ? You have bad chest pain.  ? You have blood in your spit (sputum).  ? You have a fever.  ? You keep throwing up (vomiting).  ? You feel weak, or you pass out (faint).  ? You feel confused.  ? You keep getting worse.  This information is not intended to replace advice given to you by your health care provider. Make sure you discuss any questions you have with your health care provider.  Document Released: 11/10/2011 Document Revised: 04/28/2016 Document Reviewed: 07/26/2013  [employer]vier Interactive Patient Education ? 2017 [employer]

## 2017-11-22 NOTE — Progress Notes (Addendum)
Subjective:    Patient ID: Claire [name], female    DOB: [birth year], 66 y.o.   MRN: [id number]  HPI Patient is a 66 y/o female with PMH of COPD, IDA, and recurrent UTIs who presents for urinary urgency.  Urinary urgency - Patient reports urinary urgency and nocturia that began on Friday. She denies suprapubic pain or pressure, dysuria, fever, chills, or vaginal irritation. She reports loose stools from her iron pills but reports always having problems with diarrhea but reports always wiping front to back.  Cough - pt has COPD/emphysema.Patient reports recent onset of cough in the last few weeks. She is concerned because her son in law was sick recently. She also reports fatigue and SOB with exertion that has been occurring for the last 5 months. She can no longer go shopping without getting short of breath. She is still using her Dulera inhaler and takes Claritin at night. She is still smoking but reports she has cut back some. She exercises occasionally.   Mood - Patient reports that on some days she feels down, hopeless and depressed but other days does not. She recently moved in with her son, daughter and son-in-law and reports that it is hectic. She is planning to move out. She reports she has hobbies and friends, however would prefer to stay at home than go out with them.  .. Active Ambulatory Problems    Diagnosis Date Noted  . PTSD (post-traumatic stress disorder) 11/10/2011  . Neurosis, anxiety, panic type 11/10/2011  . Acquired hypothyroidism 11/24/2014  . BP (high blood pressure) 07/24/2012  . Hypothyroid   . Chronic use of benzodiazepine for therapeutic purpose 08/11/2015  . Hyponatremia 12/19/2016  . Hyperkalemia 12/19/2016  . Centrilobular emphysema (HCC) 12/20/2016  . Decreased hemoglobin 12/20/2016  . CKD (chronic kidney disease) stage 3, GFR 30-59 ml/min (HCC) 01/16/2017  . Iron deficiency anemia 01/16/2017  . Anemia of chronic disease 05/11/2017  . Low serum vitamin  B12 05/14/2017  . Vitamin D deficiency 05/14/2017  . Depressed mood 05/14/2017  . High serum parathyroid hormone (PTH) 07/19/2017  . Normocytic anemia 07/19/2017   Resolved Ambulatory Problems    Diagnosis Date Noted  . Acute renal failure (HCC) 12/19/2016  . Acute kidney injury (HCC) 03/17/2017   Past Medical History:  Diagnosis Date  . Acid reflux   . Anxiety   . High blood pressure   . High serum parathyroid hormone (PTH) 07/19/2017  . Hypothyroid   . PTSD (post-traumatic stress disorder)      Review of Systems See HPI, all other systems reviewed are negative.    Objective:   Physical Exam  Constitutional: She is oriented to person, place, and time. She appears well-developed and well-nourished.  Cardiovascular: Normal rate, regular rhythm and normal heart sounds.  Pulmonary/Chest: Effort normal. She has wheezes (when coughing).  Rhonchi and wheezing when coughing  Neurological: She is alert and oriented to person, place, and time.  Skin: Skin is warm and dry.  Psychiatric: She has a normal mood and affect. Her behavior is normal. Thought content normal.  Vitals reviewed.     Assessment & Plan:  [name].[name].Claire [name] was seen today for urinary urgency.  Diagnoses and all orders for this visit:  COPD exacerbation (HCC) -     predniSONE (DELTASONE) 20 MG tablet; Take one tablet twice a day for 5 days.  Urinary urgency -     POCT urinalysis dipstick -     Urine Culture  Leukocytes in urine -  Urine Culture  Centrilobular emphysema (HCC) -     Fluticasone-Umeclidin-Vilant (TRELEGY ELLIPTA) 100-62.5-25 MCG/INH AEPB; Inhale 1 puff into the lungs daily.  Urinary urgency - Leukocytes present, but no blood or nitrates in urine, send for culture. - Discussed prevention of UTIs since she has recurrent infections. Discussed topical estrogen cream and daily antibiotic but that this can cause risks such as C. Diff infection and chronic diarrhea - Discussed good hygiene practices  after urination. - Advised patient that if her symptoms worsen, she develops worsening suprapubic pain, flank pain, fever, chills or nausea to follow-up.  COPD exacerbation - Switch to Trilegy once daily. Stop Dulera. - Prednisone burst to relieve acute inflammation - Patient declined a CXR today. - Discussed low dose CT scan for screening for lung cancer due to her smoking history but patient declined. -follow up in 1 month.   [name]..Spent 30 minutes with patient and greater than 50 percent of visit spent counseling patient regarding treatment plan.

## 2017-11-23 LAB — URINE CULTURE
MICRO NUMBER: 81428613
Result:: NO GROWTH
SPECIMEN QUALITY: ADEQUATE

## 2017-11-24 ENCOUNTER — Other Ambulatory Visit: Payer: Self-pay | Admitting: *Deleted

## 2017-11-24 MED ORDER — BENZONATATE 200 MG PO CAPS: 200.0000 mg | ORAL_CAPSULE | Freq: Three times a day (TID) | ORAL | 0 refills | Status: DC | PRN

## 2017-12-04 ENCOUNTER — Ambulatory Visit: Payer: Self-pay | Admitting: Physician Assistant

## 2017-12-11 ENCOUNTER — Telehealth (HOSPITAL_COMMUNITY): Payer: Self-pay

## 2017-12-11 NOTE — Telephone Encounter (Signed)
Spoke with patient and informed her she should already have a refill at the pharmacy since Dr. Gilmore LarocheAkhtar gave her a two month supply at last refill in November. Nothing further is needed at this time.

## 2017-12-11 NOTE — Telephone Encounter (Signed)
Can send if due 

## 2017-12-11 NOTE — Telephone Encounter (Signed)
Refill on Clonazepam. Please review and advise.

## 2017-12-12 ENCOUNTER — Other Ambulatory Visit (HOSPITAL_COMMUNITY): Payer: Self-pay | Admitting: Psychiatry

## 2017-12-12 NOTE — Telephone Encounter (Signed)
Clonazepam was due for a refill. Per Dr. Harlin RainAhtar a one month supply was called in to pharmacy. Nothing further needed at this time.

## 2017-12-13 ENCOUNTER — Other Ambulatory Visit: Payer: Self-pay | Admitting: Physician Assistant

## 2017-12-15 ENCOUNTER — Telehealth: Payer: Self-pay | Admitting: Physician Assistant

## 2017-12-15 MED ORDER — MOMETASONE FURO-FORMOTEROL FUM 100-5 MCG/ACT IN AERO: 2.0000 | INHALATION_SPRAY | Freq: Two times a day (BID) | RESPIRATORY_TRACT | 2 refills | Status: DC

## 2017-12-15 NOTE — Telephone Encounter (Signed)
sent 

## 2017-12-15 NOTE — Telephone Encounter (Signed)
Pt called to get a [facility]. This is not on her active list, but she is using that instead of the Trelegy. Routing to Provider. Pharmacy on file correct.

## 2017-12-15 NOTE — Telephone Encounter (Signed)
Pt advised.

## 2017-12-20 ENCOUNTER — Ambulatory Visit: Payer: Self-pay | Admitting: Physician Assistant

## 2017-12-20 ENCOUNTER — Telehealth: Payer: Self-pay | Admitting: Physician Assistant

## 2017-12-20 DIAGNOSIS — E875 Hyperkalemia: Secondary | ICD-10-CM

## 2017-12-20 DIAGNOSIS — D638 Anemia in other chronic diseases classified elsewhere: Secondary | ICD-10-CM

## 2017-12-20 DIAGNOSIS — E559 Vitamin D deficiency, unspecified: Secondary | ICD-10-CM

## 2017-12-20 DIAGNOSIS — E871 Hypo-osmolality and hyponatremia: Secondary | ICD-10-CM

## 2017-12-20 DIAGNOSIS — E538 Deficiency of other specified B group vitamins: Secondary | ICD-10-CM

## 2017-12-20 NOTE — Telephone Encounter (Signed)
Ok to order bmp, hgb, vitamin d, b12.

## 2017-12-20 NOTE — Telephone Encounter (Signed)
Pt has an appointment on Tuesday jan 22nd with you and wants to know if there is any blood work that needs dont before then. Thanks

## 2017-12-21 NOTE — Telephone Encounter (Signed)
Labs ordered. Pt notified.

## 2017-12-21 NOTE — Addendum Note (Signed)
Addended by: Donne AnonBENDER, Karyn Brull L on: 12/21/2017 12:04 PM   Modules accepted: Orders

## 2017-12-23 LAB — BASIC METABOLIC PANEL
BUN / CREAT RATIO: 11 (calc) (ref 6–22)
BUN: 12 mg/dL (ref 7–25)
CALCIUM: 9.8 mg/dL (ref 8.6–10.4)
CHLORIDE: 90 mmol/L — AB (ref 98–110)
CO2: 34 mmol/L — ABNORMAL HIGH (ref 20–32)
Creat: 1.12 mg/dL — ABNORMAL HIGH (ref 0.50–0.99)
GLUCOSE: 92 mg/dL (ref 65–99)
Potassium: 4.5 mmol/L (ref 3.5–5.3)
SODIUM: 133 mmol/L — AB (ref 135–146)

## 2017-12-23 LAB — VITAMIN D 25 HYDROXY (VIT D DEFICIENCY, FRACTURES): VIT D 25 HYDROXY: 42 ng/mL (ref 30–100)

## 2017-12-23 LAB — HEMOGLOBIN: Hemoglobin: 10.3 g/dL — ABNORMAL LOW (ref 11.7–15.5)

## 2017-12-23 LAB — VITAMIN B12: VITAMIN B 12: 403 pg/mL (ref 200–1100)

## 2017-12-26 ENCOUNTER — Encounter: Payer: Self-pay | Admitting: Physician Assistant

## 2017-12-26 ENCOUNTER — Ambulatory Visit (INDEPENDENT_AMBULATORY_CARE_PROVIDER_SITE_OTHER): Payer: Medicare Other | Admitting: Physician Assistant

## 2017-12-26 VITALS — BP 128/70 | HR 89 | Ht 63.0 in | Wt 124.0 lb

## 2017-12-26 DIAGNOSIS — N183 Chronic kidney disease, stage 3 unspecified: Secondary | ICD-10-CM

## 2017-12-26 DIAGNOSIS — F17200 Nicotine dependence, unspecified, uncomplicated: Secondary | ICD-10-CM

## 2017-12-26 DIAGNOSIS — F4310 Post-traumatic stress disorder, unspecified: Secondary | ICD-10-CM

## 2017-12-26 DIAGNOSIS — I10 Essential (primary) hypertension: Secondary | ICD-10-CM | POA: Diagnosis not present

## 2017-12-26 DIAGNOSIS — F329 Major depressive disorder, single episode, unspecified: Secondary | ICD-10-CM

## 2017-12-26 DIAGNOSIS — F410 Panic disorder [episodic paroxysmal anxiety] without agoraphobia: Secondary | ICD-10-CM | POA: Diagnosis not present

## 2017-12-26 DIAGNOSIS — E559 Vitamin D deficiency, unspecified: Secondary | ICD-10-CM

## 2017-12-26 DIAGNOSIS — R4589 Other symptoms and signs involving emotional state: Secondary | ICD-10-CM

## 2017-12-26 DIAGNOSIS — E871 Hypo-osmolality and hyponatremia: Secondary | ICD-10-CM

## 2017-12-26 DIAGNOSIS — J432 Centrilobular emphysema: Secondary | ICD-10-CM | POA: Diagnosis not present

## 2017-12-26 DIAGNOSIS — D638 Anemia in other chronic diseases classified elsewhere: Secondary | ICD-10-CM

## 2017-12-26 MED ORDER — MOMETASONE FURO-FORMOTEROL FUM 100-5 MCG/ACT IN AERO
2.0000 | INHALATION_SPRAY | Freq: Two times a day (BID) | RESPIRATORY_TRACT | 5 refills | Status: DC
Start: 2017-12-26 — End: 2018-08-09

## 2017-12-26 NOTE — Patient Instructions (Signed)
Hyponatremia Hyponatremia is when the amount of salt (sodium) in your blood is too low. When salt levels are low, your cells absorb extra water and they swell. The swelling happens throughout the body, but it mostly affects the brain. Follow these instructions at home:  Take medicines only as told by your doctor. Many medicines can make this condition worse. Talk with your doctor about any medicines that you are currently taking.  Carefully follow a recommended diet as told by your doctor.  Carefully follow instructions from your doctor about fluid restrictions.  Keep all follow-up visits as told by your doctor. This is important.  Do not drink alcohol. Contact a doctor if:  You feel sicker to your stomach (nauseous).  You feel more confused.  You feel more tired (fatigued).  Your headache gets worse.  You feel weaker.  Your symptoms go away and then they come back.  You have trouble following the diet instructions. Get help right away if:  You start to twitch and shake (have a seizure).  You pass out (faint).  You keep having watery poop (diarrhea).  You keep throwing up (vomiting). This information is not intended to replace advice given to you by your health care provider. Make sure you discuss any questions you have with your health care provider. Document Released: 08/03/2011 Document Revised: 04/28/2016 Document Reviewed: 11/17/2014 Elsevier Interactive Patient Education  2018 Elsevier Inc.  

## 2017-12-29 ENCOUNTER — Encounter: Payer: Self-pay | Admitting: Physician Assistant
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
## 2018-01-09 ENCOUNTER — Ambulatory Visit (INDEPENDENT_AMBULATORY_CARE_PROVIDER_SITE_OTHER): Payer: Medicare Other | Admitting: Psychiatry

## 2018-01-09 ENCOUNTER — Encounter (HOSPITAL_COMMUNITY): Payer: Self-pay | Admitting: Psychiatry

## 2018-01-09 VITALS — BP 102/62 | HR 73 | Ht 63.0 in | Wt 124.0 lb

## 2018-01-09 DIAGNOSIS — Z818 Family history of other mental and behavioral disorders: Secondary | ICD-10-CM

## 2018-01-09 DIAGNOSIS — Z79899 Other long term (current) drug therapy: Secondary | ICD-10-CM | POA: Diagnosis not present

## 2018-01-09 DIAGNOSIS — F410 Panic disorder [episodic paroxysmal anxiety] without agoraphobia: Secondary | ICD-10-CM

## 2018-01-09 DIAGNOSIS — F411 Generalized anxiety disorder: Secondary | ICD-10-CM | POA: Diagnosis not present

## 2018-01-09 DIAGNOSIS — F17200 Nicotine dependence, unspecified, uncomplicated: Secondary | ICD-10-CM | POA: Diagnosis not present

## 2018-01-09 DIAGNOSIS — F4310 Post-traumatic stress disorder, unspecified: Secondary | ICD-10-CM

## 2018-01-09 DIAGNOSIS — Z811 Family history of alcohol abuse and dependence: Secondary | ICD-10-CM

## 2018-01-09 MED ORDER — CLONAZEPAM 0.5 MG PO TABS: ORAL_TABLET | ORAL | 1 refills | Status: DC

## 2018-01-09 NOTE — Progress Notes (Signed)
Patient ID: Claire [name], female   DOB: January 13, 1951, 67 y.o.   MRN: [id number]  [facility] Outpatient Follow up visit  Claire [name] [id number] 67 y.o.  01/09/2018 3:33 PM  Chief Complaint:  Establish care for panic attacks  History of Present Illness:   Patient Presents for follow up and medication management for  panic symptoms. Diagnosed with PTSD, panic symptoms and GAD.  Conflicts with daughter stressful Have used other meds but sticks to klonopine only. Says others not work  It helps panic attacks  Modifying factor: breahting techniques.grandkids   PTSD is related to her past some flashbacks. Distraction helps Depression is baseline No side effects     Aggravating factors:husband illness. Living with daughter   No psychosis  Medical History; Past Medical History:  Diagnosis Date  . Acid reflux   . Anxiety   . High blood pressure   . High serum parathyroid hormone (PTH) 07/19/2017   96 06/28/2014, 162 12/29/16  . Hypothyroid   . PTSD (post-traumatic stress disorder)     Allergies: Allergies  Allergen Reactions  . Macrodantin [Nitrofurantoin Macrocrystal] Rash  . Bactrim [Sulfamethoxazole-Trimethoprim]     ACUTE RENAL fAILURE/HYPONATREMIA/HYPERKALEMIA  . Zithromax [Azithromycin]     Medications: Outpatient Encounter Medications as of 01/09/2018  Medication Sig  . albuterol (PROVENTIL HFA;VENTOLIN HFA) 108 (90 Base) MCG/ACT inhaler Inhale 2 puffs into the lungs every 4 (four) hours as needed for wheezing.  [name] albuterol (PROVENTIL) (2.5 MG/3ML) 0.083% nebulizer solution Take 3 mLs (2.5 mg total) by nebulization every 4 (four) hours as needed for wheezing or shortness of breath.  [name] atenolol (TENORMIN) 50 MG tablet TAKE 1.5 TABLETS (75 MG TOTAL) BY MOUTH DAILY.  . Calcium Carbonate-Vitamin D (CALCIUM-VITAMIN D) 500-200 MG-UNIT per tablet Take 1 tablet by mouth daily.  . clonazePAM (KLONOPIN) 0.5 MG tablet TAKE 1 TABLET BY MOUTH 3 TIMES A DAY  .  Cobalamine Combinations (B-12) [phone] MCG SUBL Place under the tongue.  . Cyanocobalamin (B-12) 500 MCG SUBL Place 1,000 mcg under the tongue daily.  . ferrous sulfate 325 (65 FE) MG EC tablet 1 tab PO TID every other day  . levothyroxine (SYNTHROID, LEVOTHROID) 75 MCG tablet Take 1 tablet (75 mcg total) by mouth daily.  [name] MAGNESIUM PO Take by mouth.  . mometasone-formoterol (DULERA) 100-5 MCG/ACT AERO Inhale 2 puffs into the lungs 2 (two) times daily.  . ranitidine (ZANTAC) 150 MG tablet Take 1 tablet (150 mg total) by mouth 2 (two) times daily.  . [DISCONTINUED] clonazePAM (KLONOPIN) 0.5 MG tablet TAKE 1 TABLET BY MOUTH 3 TIMES A DAY   No facility-administered encounter medications on file as of 01/09/2018.     Family History; Family History  Problem Relation Age of Onset  . Anxiety disorder Mother   . OCD Daughter   . Alcohol abuse Father   . Anxiety disorder Sister        Labs:  Recent Results (from the past 2160 hour(s))  POCT urinalysis dipstick     Status: Abnormal   Collection Time: 10/17/17  2:24 PM  Result Value Ref Range   Color, UA yellow    Clarity, UA clear    Glucose, UA neg    Bilirubin, UA neg    Ketones, UA neg    Spec Grav, UA 1.015 1.010 - 1.025   Blood, UA neg    pH, UA 5.5 5.0 - 8.0   Protein, UA neg    Urobilinogen, UA 0.2 0.2 or 1.0 E.U./dL  Nitrite, UA neg    Leukocytes, UA Trace (A) Negative  Urine Culture     Status: None   Collection Time: 10/17/17  3:24 PM  Result Value Ref Range   MICRO NUMBER: [id number]    SPECIMEN QUALITY: ADEQUATE    Sample Source URINE    STATUS: FINAL    Result: No Growth   Urine Culture     Status: None   Collection Time: 11/22/17  3:43 PM  Result Value Ref Range   MICRO NUMBER: [id number]    SPECIMEN QUALITY: ADEQUATE    Sample Source NOT GIVEN    STATUS: FINAL    Result: No Growth   POCT urinalysis dipstick     Status: Abnormal   Collection Time: 11/22/17  3:44 PM  Result Value Ref Range   Color, UA  light yellow    Clarity, UA clear    Glucose, UA neg    Bilirubin, UA neg    Ketones, UA neg    Spec Grav, UA 1.010 1.010 - 1.025   Blood, UA neg    pH, UA 7.0 5.0 - 8.0   Protein, UA neg    Urobilinogen, UA 0.2 0.2 or 1.0 E.U./dL   Nitrite, UA neg    Leukocytes, UA Trace (A) Negative   Appearance     Odor    Basic Metabolic Panel (BMET)     Status: Abnormal   Collection Time: 12/22/17  2:39 PM  Result Value Ref Range   Glucose, Bld 92 65 - 99 mg/dL    Comment: .            Fasting reference interval .    BUN 12 7 - 25 mg/dL   Creat 5.36 (H) 6.44 - 0.99 mg/dL    Comment: For patients >68 years of age, the reference limit for Creatinine is approximately 13% higher for people identified as African-American. .    BUN/Creatinine Ratio 11 6 - 22 (calc)   Sodium 133 (L) 135 - 146 mmol/L   Potassium 4.5 3.5 - 5.3 mmol/L   Chloride 90 (L) 98 - 110 mmol/L   CO2 34 (H) 20 - 32 mmol/L   Calcium 9.8 8.6 - 10.4 mg/dL  Hemoglobin     Status: Abnormal   Collection Time: 12/22/17  2:39 PM  Result Value Ref Range   Hemoglobin 10.3 (L) 11.7 - 15.5 g/dL  Vitamin D (25 hydroxy)     Status: None   Collection Time: 12/22/17  2:39 PM  Result Value Ref Range   Vit D, 25-Hydroxy 42 30 - 100 ng/mL    Comment: Vitamin D Status         25-OH Vitamin D: . Deficiency:                    <20 ng/mL Insufficiency:             20 - 29 ng/mL Optimal:                 > or = 30 ng/mL . For 25-OH Vitamin D testing on patients on  D2-supplementation and patients for whom quantitation  of D2 and D3 fractions is required, the QuestAssureD(TM) 25-OH VIT D, (D2,D3), LC/MS/MS is recommended: order  code 03474 (patients >49yrs). . For more information on this test, go to: http://education.questdiagnostics.com/faq/FAQ163 (This link is being provided for  informational/educational purposes only.)   B12     Status: None   Collection Time: 12/22/17  2:39 PM  Result Value Ref Range   Vitamin B-12 403 200  - 1,100 pg/mL      Mental Status Examination;   Psychiatric Specialty Exam: Physical Exam  Constitutional: She appears well-nourished.  Skin: She is not diaphoretic.    Review of Systems  Constitutional: Negative for fever.  Cardiovascular: Negative for chest pain.  Gastrointestinal: Negative for nausea.  Skin: Negative for rash.  Neurological: Negative for tremors and headaches.  Psychiatric/Behavioral: Negative for depression and substance abuse.    Blood pressure 102/62, pulse 73, height 5\' 3"  (1.6 m), weight 124 lb (56.2 kg).Body mass index is 21.97 kg/m.  General Appearance: Casual  Eye Contact::  Fair  Speech:  Slow  Volume:  Decreased  Mood:  fair  Affect:  congruent  Thought Process:  Coherent and intact  Orientation:  Full (Time, Place, and Person)  Thought Content:  Rumination  Suicidal Thoughts:  No  Homicidal Thoughts:  No  Memory:  Immediate;   Fair Recent;   Fair  Judgement:  Fair  Insight:  Shallow  Psychomotor Activity:  Normal  Concentration:  Fair  Recall:  Fair  Akathisia:  Negative  Handed:  Right  AIMS (if indicated):     Assets:  Social Support Vocational/Educational  Sleep:        Assessment: Axis I: Panic disorder. Possible PTSD. Rule out generalized anxiety disorder. Nicotine dependence  Axis II: Deferred  Axis III:  Past Medical History:  Diagnosis Date  . Acid reflux   . Anxiety   . High blood pressure   . High serum parathyroid hormone (PTH) 07/19/2017   96 06/28/2014, 162 12/29/16  . Hypothyroid   . PTSD (post-traumatic stress disorder)     Axis IV: Psychosocial. History of trauma   Treatment Plan and Summary:  Anxiety and GAD: baseline. Continue klonopine Avoid alcohodl PTSD: baseline. continuie disraction techniques   Panic attacks:infequent Continue klonopine. Cautioned about side effects and tolerance FU 2-3 months  Thresa RossNadeem Alejandra Hunt, MD 01/09/2018

## 2018-01-10 LAB — BASIC METABOLIC PANEL
BUN / CREAT RATIO: 12 (calc) (ref 6–22)
BUN: 14 mg/dL (ref 7–25)
CHLORIDE: 94 mmol/L — AB (ref 98–110)
CO2: 37 mmol/L — AB (ref 20–32)
CREATININE: 1.21 mg/dL — AB (ref 0.50–0.99)
Calcium: 9.7 mg/dL (ref 8.6–10.4)
GLUCOSE: 92 mg/dL (ref 65–99)
Potassium: 4.2 mmol/L (ref 3.5–5.3)
Sodium: 135 mmol/L (ref 135–146)

## 2018-01-11 ENCOUNTER — Telehealth: Payer: Self-pay

## 2018-01-11 ENCOUNTER — Other Ambulatory Visit: Payer: Self-pay

## 2018-01-11 ENCOUNTER — Telehealth: Payer: Self-pay | Admitting: Physician Assistant

## 2018-01-11 MED ORDER — OSELTAMIVIR PHOSPHATE 30 MG PO CAPS: 30.0000 mg | ORAL_CAPSULE | Freq: Every day | ORAL | 0 refills | Status: DC

## 2018-01-11 NOTE — Telephone Encounter (Signed)
Because of her creatinine clearance will adjust dose based on renal function.  Change to Tamiflu 30 mg daily times 10 days.

## 2018-01-11 NOTE — Telephone Encounter (Signed)
Rinaldo Cloudamela states her son-in-law has tested positive for the flu. She would like Tamiflu sent to [facility].

## 2018-01-11 NOTE — Telephone Encounter (Signed)
Patient advised.

## 2018-01-14 NOTE — Telephone Encounter (Signed)
Opened encounter in error  

## 2018-01-16 ENCOUNTER — Ambulatory Visit (INDEPENDENT_AMBULATORY_CARE_PROVIDER_SITE_OTHER): Payer: Medicare Other | Admitting: Physician Assistant

## 2018-01-16 VITALS — BP 134/80 | HR 92 | Temp 98.0°F | Resp 18 | Wt 128.9 lb

## 2018-01-16 DIAGNOSIS — R300 Dysuria: Secondary | ICD-10-CM

## 2018-01-16 LAB — POCT URINALYSIS DIP (MANUAL ENTRY)
BILIRUBIN UA: NEGATIVE mg/dL
Bilirubin, UA: NEGATIVE
GLUCOSE UA: NEGATIVE mg/dL
Nitrite, UA: POSITIVE — AB
Spec Grav, UA: 1.01 (ref 1.010–1.025)
UROBILINOGEN UA: 0.2 U/dL
pH, UA: 6.5 (ref 5.0–8.0)

## 2018-01-16 MED ORDER — CEPHALEXIN 500 MG PO CAPS: 500.0000 mg | ORAL_CAPSULE | Freq: Two times a day (BID) | ORAL | 0 refills | Status: DC

## 2018-01-16 NOTE — Progress Notes (Signed)
Sent keflex 500mg  one tablet bid for 7 days.

## 2018-01-16 NOTE — Progress Notes (Signed)
Sent keflex for 7 days for UTI. Will culture.   .. Results for orders placed or performed in visit on 01/16/18  POCT urinalysis dipstick  Result Value Ref Range   Color, UA light yellow (A) yellow   Clarity, UA turbid (A) clear   Glucose, UA negative negative mg/dL   Bilirubin, UA negative negative   Ketones, POC UA negative negative mg/dL   Spec Grav, UA 1.6101.010 9.6041.010 - 1.025   Blood, UA small (A) negative   pH, UA 6.5 5.0 - 8.0   Protein Ur, POC =30 (A) negative mg/dL   Urobilinogen, UA 0.2 0.2 or 1.0 E.U./dL   Nitrite, UA Positive (A) Negative   Leukocytes, UA Large (3+) (A) Negative

## 2018-01-16 NOTE — Progress Notes (Signed)
HPI: Patient is here with complaints of dysuria, lower abdomen pressure and polyuria for the past 2-3 days. Patient denies lower back pain, hematuria or fever.   Assessment and Plan: Patient gave urine specimen. Reviewed with provider. Patient advised to once provider has reviewed lab results an antibiotic would be electronically sent to her pharmacy. Patient stated she understood.

## 2018-01-19 LAB — URINE CULTURE
MICRO NUMBER: 90187668
SPECIMEN QUALITY:: ADEQUATE

## 2018-01-20 ENCOUNTER — Other Ambulatory Visit: Payer: Self-pay | Admitting: Physician Assistant

## 2018-01-22 NOTE — Progress Notes (Signed)
I would like to see that you have at least 4 UTI's a year. You did have 2 UTI's close together. Let's just watch for now.

## 2018-01-22 NOTE — Progress Notes (Signed)
Call pt: how are you feeling on keflex. E.coli detected. Cephalosporins are showing sensitive which means kelfex should work. Are symptoms improving?

## 2018-02-13 ENCOUNTER — Telehealth: Payer: Self-pay

## 2018-02-13 NOTE — Telephone Encounter (Signed)
She can try flonase 2 sprays each nostril daily are you taking antihistamin like clartin daily?

## 2018-02-13 NOTE — Telephone Encounter (Signed)
Claire [name] called and reports drainage in her throat, cough and sneezing. Denies sore throat, fever, chills, sweats or pressure in face. She has not tried any OTC medications. Claire [name] refused appointment.

## 2018-02-13 NOTE — Telephone Encounter (Signed)
Patient advised of recommendations.  

## 2018-02-19 ENCOUNTER — Telehealth (HOSPITAL_COMMUNITY): Payer: Self-pay | Admitting: Psychiatry

## 2018-02-19 MED ORDER — CLONAZEPAM 0.5 MG PO TABS: ORAL_TABLET | ORAL | 1 refills | Status: DC

## 2018-02-19 NOTE — Telephone Encounter (Signed)
sent 

## 2018-02-19 NOTE — Telephone Encounter (Signed)
Informed patient of medication sent to pharmacy. Nothing further is needed at this time.

## 2018-02-19 NOTE — Telephone Encounter (Signed)
Pt needs refill on klonopin [facility]  cb to [phone]

## 2018-02-28 ENCOUNTER — Telehealth: Payer: Self-pay | Admitting: Physician Assistant

## 2018-02-28 NOTE — Telephone Encounter (Signed)
Pt called clinic stating her lives with her daughter and they have been painting inside the house for the last week. She is requesting an Rx for prednisone for her COPD. She reports the fumes are causing a "flare up." Will route.

## 2018-02-28 NOTE — Telephone Encounter (Signed)
We cannot prescribe prednisone without office visit. She can try benadryl tonight before bed and zyrtec during the day. We could send over albuterol inhaler 2puffs q 4 hours as needed for SOB/cough. I can work her in Friday if I need too.

## 2018-03-01 NOTE — Telephone Encounter (Signed)
Pt advised and reports she has been using the benadryl and zyrtec daily. She also has an inhaler that she has been using. She does not feel she needs an appt at this time. She will contact office if symptoms change.

## 2018-03-06 ENCOUNTER — Telehealth: Payer: Self-pay | Admitting: Physician Assistant

## 2018-03-06 DIAGNOSIS — N183 Chronic kidney disease, stage 3 unspecified: Secondary | ICD-10-CM

## 2018-03-06 NOTE — Telephone Encounter (Signed)
Order signed. Pt advised

## 2018-03-06 NOTE — Telephone Encounter (Signed)
Ok to have drawn.

## 2018-03-06 NOTE — Telephone Encounter (Signed)
Pt left VM requesting to have another metabolic panel done since she has completed the Tamiflu. Pt reports she wants to "make sure everything is ok" since she has CKD. Will route.

## 2018-03-07 ENCOUNTER — Other Ambulatory Visit: Payer: Self-pay | Admitting: Physician Assistant

## 2018-03-09 LAB — COMPLETE METABOLIC PANEL WITH GFR
AG Ratio: 1.4 (calc) (ref 1.0–2.5)
ALT: 5 U/L — AB (ref 6–29)
AST: 14 U/L (ref 10–35)
Albumin: 4.3 g/dL (ref 3.6–5.1)
Alkaline phosphatase (APISO): 62 U/L (ref 33–130)
BILIRUBIN TOTAL: 0.4 mg/dL (ref 0.2–1.2)
BUN/Creatinine Ratio: 8 (calc) (ref 6–22)
BUN: 11 mg/dL (ref 7–25)
CHLORIDE: 96 mmol/L — AB (ref 98–110)
CO2: 34 mmol/L — AB (ref 20–32)
CREATININE: 1.3 mg/dL — AB (ref 0.50–0.99)
Calcium: 9.6 mg/dL (ref 8.6–10.4)
GFR, EST AFRICAN AMERICAN: 49 mL/min/{1.73_m2} — AB (ref >=60)
GFR, Est Non African American: 42 mL/min/{1.73_m2} — ABNORMAL LOW (ref >=60)
GLUCOSE: 81 mg/dL (ref 65–99)
Globulin: 3.1 g/dL (calc) (ref 1.9–3.7)
Potassium: 4.3 mmol/L (ref 3.5–5.3)
Sodium: 137 mmol/L (ref 135–146)
TOTAL PROTEIN: 7.4 g/dL (ref 6.1–8.1)

## 2018-03-09 NOTE — Telephone Encounter (Signed)
Serum creatine up from 1.21 to 1.30. No significant change.

## 2018-03-16 ENCOUNTER — Ambulatory Visit: Payer: Self-pay | Admitting: Family Medicine

## 2018-03-16 NOTE — Progress Notes (Deleted)
   Subjective:    Patient ID: Claire [name], female    DOB: [birth year], 67 y.o.   MRN: [id number]  HPI 67 year old female comes in today complaining of cough   Review of Systems     Objective:   Physical Exam  Constitutional: She is oriented to person, place, and time. She appears well-developed and well-nourished.  HENT:  Head: Normocephalic and atraumatic.  Right Ear: External ear normal.  Left Ear: External ear normal.  Nose: Nose normal.  Mouth/Throat: Oropharynx is clear and moist.  TMs and canals are clear.   Eyes: Pupils are equal, round, and reactive to light. Conjunctivae and EOM are normal.  Neck: Neck supple. No thyromegaly present.  Cardiovascular: Normal rate, regular rhythm and normal heart sounds.  Pulmonary/Chest: Effort normal and breath sounds normal. She has no wheezes.  Lymphadenopathy:    She has no cervical adenopathy.  Neurological: She is alert and oriented to person, place, and time.  Skin: Skin is warm and dry.  Psychiatric: She has a normal mood and affect.          Assessment & Plan:

## 2018-04-03 ENCOUNTER — Ambulatory Visit (INDEPENDENT_AMBULATORY_CARE_PROVIDER_SITE_OTHER): Payer: Medicare Other | Admitting: Psychiatry

## 2018-04-03 ENCOUNTER — Encounter (HOSPITAL_COMMUNITY): Payer: Self-pay | Admitting: Psychiatry

## 2018-04-03 VITALS — BP 128/62 | HR 89 | Ht 63.0 in | Wt 122.0 lb

## 2018-04-03 DIAGNOSIS — F411 Generalized anxiety disorder: Secondary | ICD-10-CM

## 2018-04-03 DIAGNOSIS — Z8659 Personal history of other mental and behavioral disorders: Secondary | ICD-10-CM | POA: Diagnosis not present

## 2018-04-03 DIAGNOSIS — Z811 Family history of alcohol abuse and dependence: Secondary | ICD-10-CM

## 2018-04-03 DIAGNOSIS — Z818 Family history of other mental and behavioral disorders: Secondary | ICD-10-CM | POA: Diagnosis not present

## 2018-04-03 DIAGNOSIS — Z62820 Parent-biological child conflict: Secondary | ICD-10-CM

## 2018-04-03 DIAGNOSIS — F4310 Post-traumatic stress disorder, unspecified: Secondary | ICD-10-CM

## 2018-04-03 DIAGNOSIS — F17210 Nicotine dependence, cigarettes, uncomplicated: Secondary | ICD-10-CM

## 2018-04-03 DIAGNOSIS — F410 Panic disorder [episodic paroxysmal anxiety] without agoraphobia: Secondary | ICD-10-CM

## 2018-04-03 MED ORDER — CLONAZEPAM 0.5 MG PO TABS: ORAL_TABLET | ORAL | 0 refills | Status: DC

## 2018-04-03 NOTE — Progress Notes (Signed)
Patient ID: Claire [name], female   DOB: [birth year], 67 y.o.   MRN: [id number]  [facility] Outpatient Follow up visit  MALYN AYTES [id number] 67 y.o.  04/03/2018 3:16 PM  Chief Complaint:  Establish care for panic attacks  History of Present Illness:   Patient Presents for follow up and medication management for  panic symptoms. Diagnosed with PTSD, panic symptoms and GAD.  Conflicts with daughter stressful Daughter curses her Patient is looking to rent out elsewhere Says not depressed but due to daughter gets emotional. Without klonopine feels more overwhelemed cuationed about side effects of benzo and effect on memory   Modifying factor: breahting techniques.grandkids   PTSD is related to her past some flashbacks. Distraction helps Depression is baseline No side effects     Aggravating factors:husband illness. Living with daughter   No psychosis  Medical History; Past Medical History:  Diagnosis Date  . Acid reflux   . Anxiety   . High blood pressure   . High serum parathyroid hormone (PTH) 07/19/2017   96 06/28/2014, 162 12/29/16  . Hypothyroid   . PTSD (post-traumatic stress disorder)     Allergies: Allergies  Allergen Reactions  . Macrodantin [Nitrofurantoin Macrocrystal] Rash  . Bactrim [Sulfamethoxazole-Trimethoprim]     ACUTE RENAL fAILURE/HYPONATREMIA/HYPERKALEMIA  . Zithromax [Azithromycin]     Medications: Outpatient Encounter Medications as of 04/03/2018  Medication Sig  . albuterol (PROVENTIL HFA;VENTOLIN HFA) 108 (90 Base) MCG/ACT inhaler Inhale 2 puffs into the lungs every 4 (four) hours as needed for wheezing.  [name] albuterol (PROVENTIL) (2.5 MG/3ML) 0.083% nebulizer solution Take 3 mLs (2.5 mg total) by nebulization every 4 (four) hours as needed for wheezing or shortness of breath.  [name] atenolol (TENORMIN) 50 MG tablet TAKE 1.5 TABLETS (75 MG TOTAL) BY MOUTH DAILY.  . Calcium Carbonate-Vitamin D (CALCIUM-VITAMIN D) 500-200 MG-UNIT  per tablet Take 1 tablet by mouth daily.  . cephALEXin (KEFLEX) 500 MG capsule Take 1 capsule (500 mg total) by mouth 2 (two) times daily.  . clonazePAM (KLONOPIN) 0.5 MG tablet TAKE 1 TABLET BY MOUTH 3 TIMES A DAY  . Cobalamine Combinations (B-12) [phone] MCG SUBL Place under the tongue.  . Cyanocobalamin (B-12) 500 MCG SUBL Place 1,000 mcg under the tongue daily.  . ferrous sulfate 325 (65 FE) MG EC tablet 1 tab PO TID every other day  . levothyroxine (SYNTHROID, LEVOTHROID) 75 MCG tablet TAKE 1 TABLET BY MOUTH EVERY DAY  . MAGNESIUM PO Take by mouth.  . mometasone-formoterol (DULERA) 100-5 MCG/ACT AERO Inhale 2 puffs into the lungs 2 (two) times daily.  [name] oseltamivir (TAMIFLU) 30 MG capsule Take 1 capsule (30 mg total) by mouth daily.  . ranitidine (ZANTAC) 150 MG tablet Take 1 tablet (150 mg total) by mouth 2 (two) times daily.  . [DISCONTINUED] clonazePAM (KLONOPIN) 0.5 MG tablet TAKE 1 TABLET BY MOUTH 3 TIMES A DAY   No facility-administered encounter medications on file as of 04/03/2018.     Family History; Family History  Problem Relation Age of Onset  . Anxiety disorder Mother   . OCD Daughter   . Alcohol abuse Father   . Anxiety disorder Sister        Labs:  Recent Results (from the past 2160 hour(s))  Basic metabolic panel     Status: Abnormal   Collection Time: 01/09/18  2:50 PM  Result Value Ref Range   Glucose, Bld 92 65 - 99 mg/dL    Comment: .  Fasting reference interval .    BUN 14 7 - 25 mg/dL   Creat 1.61 (H) 0.96 - 0.99 mg/dL    Comment: For patients >46 years of age, the reference limit for Creatinine is approximately 13% higher for people identified as African-American. .    BUN/Creatinine Ratio 12 6 - 22 (calc)   Sodium 135 135 - 146 mmol/L   Potassium 4.2 3.5 - 5.3 mmol/L   Chloride 94 (L) 98 - 110 mmol/L   CO2 37 (H) 20 - 32 mmol/L    Comment: Verified by repeat analysis. .    Calcium 9.7 8.6 - 10.4 mg/dL  POCT urinalysis  dipstick     Status: Abnormal   Collection Time: 01/16/18  4:14 PM  Result Value Ref Range   Color, UA light yellow (A) yellow   Clarity, UA turbid (A) clear   Glucose, UA negative negative mg/dL   Bilirubin, UA negative negative   Ketones, POC UA negative negative mg/dL   Spec Grav, UA 0.454 0.981 - 1.025   Blood, UA small (A) negative   pH, UA 6.5 5.0 - 8.0   Protein Ur, POC =30 (A) negative mg/dL   Urobilinogen, UA 0.2 0.2 or 1.0 E.U./dL   Nitrite, UA Positive (A) Negative   Leukocytes, UA Large (3+) (A) Negative  Urine Culture     Status: Abnormal   Collection Time: 01/16/18  4:23 PM  Result Value Ref Range   MICRO NUMBER: [id number]    SPECIMEN QUALITY: ADEQUATE    Sample Source NOT GIVEN    STATUS: FINAL    ISOLATE 1: Escherichia coli (A)       Susceptibility   Escherichia coli - URINE CULTURE, REFLEX    AMOX/CLAVULANIC 8 Sensitive     AMPICILLIN >=32 Resistant     AMPICILLIN/SULBACTAM 16 Intermediate     CEFAZOLIN* <=4 Not Reportable      * For infections other than uncomplicated UTIcaused by E. coli, K. pneumoniae or P. mirabilis:Cefazolin is resistant if MIC > or = 8 mcg/mL.(Distinguishing susceptible versus intermediatefor isolates with MIC < or = 4 mcg/mL requiresadditional testing.)For uncomplicated UTI caused by E. coli,K. pneumoniae or P. mirabilis: Cefazolin issusceptible if MIC <32 mcg/mL and predictssusceptible to the oral agents cefaclor, cefdinir,cefpodoxime, cefprozil, cefuroxime, cephalexinand loracarbef.    CEFEPIME <=1 Sensitive     CEFTRIAXONE <=1 Sensitive     CIPROFLOXACIN >=4 Resistant     LEVOFLOXACIN >=8 Resistant     ERTAPENEM <=0.5 Sensitive     GENTAMICIN <=1 Sensitive     IMIPENEM <=0.25 Sensitive     NITROFURANTOIN <=16 Sensitive     PIP/TAZO <=4 Sensitive     TOBRAMYCIN <=1 Sensitive     TRIMETH/SULFA* <=20 Sensitive      * For infections other than uncomplicated UTIcaused by E. coli, K. pneumoniae or P. mirabilis:Cefazolin is resistant if MIC  > or = 8 mcg/mL.(Distinguishing susceptible versus intermediatefor isolates with MIC < or = 4 mcg/mL requiresadditional testing.)For uncomplicated UTI caused by E. coli,K. pneumoniae or P. mirabilis: Cefazolin issusceptible if MIC <32 mcg/mL and predictssusceptible to the oral agents cefaclor, cefdinir,cefpodoxime, cefprozil, cefuroxime, cephalexinand loracarbef.Legend:S = Susceptible  I = IntermediateR = Resistant  NS = Not susceptible* = Not tested  NR = Not reported**NN = See antimicrobic comments  COMPLETE METABOLIC PANEL WITH GFR     Status: Abnormal   Collection Time: 03/08/18  2:37 PM  Result Value Ref Range   Glucose, Bld 81 65 - 99 mg/dL  Comment: .            Fasting reference interval .    BUN 11 7 - 25 mg/dL   Creat 4.09 (H) 8.11 - 0.99 mg/dL    Comment: For patients >20 years of age, the reference limit for Creatinine is approximately 13% higher for people identified as African-American. .    GFR, Est Non African American 42 (L) > OR = 60 mL/min/1.11m2   GFR, Est African American 49 (L) > OR = 60 mL/min/1.33m2   BUN/Creatinine Ratio 8 6 - 22 (calc)   Sodium 137 135 - 146 mmol/L   Potassium 4.3 3.5 - 5.3 mmol/L   Chloride 96 (L) 98 - 110 mmol/L   CO2 34 (H) 20 - 32 mmol/L   Calcium 9.6 8.6 - 10.4 mg/dL   Total Protein 7.4 6.1 - 8.1 g/dL   Albumin 4.3 3.6 - 5.1 g/dL   Globulin 3.1 1.9 - 3.7 g/dL (calc)   AG Ratio 1.4 1.0 - 2.5 (calc)   Total Bilirubin 0.4 0.2 - 1.2 mg/dL   Alkaline phosphatase (APISO) 62 33 - 130 U/L   AST 14 10 - 35 U/L   ALT 5 (L) 6 - 29 U/L      Mental Status Examination;   Psychiatric Specialty Exam: Physical Exam  Constitutional: She appears well-nourished.  Skin: She is not diaphoretic.    Review of Systems  Constitutional: Negative for fever.  Cardiovascular: Negative for chest pain and palpitations.  Gastrointestinal: Negative for nausea.  Skin: Negative for rash.  Neurological: Negative for tremors and headaches.   Psychiatric/Behavioral: Negative for suicidal ideas.    Blood pressure 128/62, pulse 89, height  (1.6 m), weight 122 lb (55.3 kg).Body mass index is 21.61 kg/m.  General Appearance: Casual  Eye Contact::  Fair  Speech:  Slow  Volume:  Decreased  Mood: subdued  Affect:  congruent  Thought Process:  Coherent and intact  Orientation:  Full (Time, Place, and Person)  Thought Content:  Rumination  Suicidal Thoughts:  No  Homicidal Thoughts:  No  Memory:  Immediate;   Fair Recent;   Fair  Judgement:  Fair  Insight:  Shallow  Psychomotor Activity:  Normal  Concentration:  Fair  Recall:  Fair  Akathisia:  Negative  Handed:  Right  AIMS (if indicated):     Assets:  Social Support Vocational/Educational  Sleep:        Assessment: Axis I: Panic disorder. Possible PTSD. Rule out generalized anxiety disorder. Nicotine dependence  Axis II: Deferred  Axis III:  Past Medical History:  Diagnosis Date  . Acid reflux   . Anxiety   . High blood pressure   . High serum parathyroid hormone (PTH) 07/19/2017   96 06/28/2014, 162 12/29/16  . Hypothyroid   . PTSD (post-traumatic stress disorder)     Axis IV: Psychosocial. History of trauma   Treatment Plan and Summary:  Anxiety and GAD: fluctuates depending on stress with daughter. Does not want to change klonopine Will continue Avoid alcohodl Looking to live seperately soon PTSD: baseline. continuie disraction techniques   Panic attacks:infequent Continue klonopine. Cautioned about side effects and tolerance FU 2-3 months  Thresa Ross, MD 04/03/2018

## 2018-04-10 ENCOUNTER — Ambulatory Visit (INDEPENDENT_AMBULATORY_CARE_PROVIDER_SITE_OTHER): Payer: Medicare Other | Admitting: Physician Assistant

## 2018-04-10 ENCOUNTER — Encounter: Payer: Self-pay | Admitting: Physician Assistant

## 2018-04-10 VITALS — BP 117/61 | HR 67 | Ht 63.0 in | Wt 122.0 lb

## 2018-04-10 DIAGNOSIS — R300 Dysuria: Secondary | ICD-10-CM

## 2018-04-10 DIAGNOSIS — J432 Centrilobular emphysema: Secondary | ICD-10-CM | POA: Diagnosis not present

## 2018-04-10 LAB — POCT URINALYSIS DIPSTICK
BILIRUBIN UA: NEGATIVE
Blood, UA: NEGATIVE
GLUCOSE UA: NEGATIVE
Ketones, UA: NEGATIVE
LEUKOCYTES UA: NEGATIVE
Nitrite, UA: NEGATIVE
Protein, UA: NEGATIVE
SPEC GRAV UA: 1.01 (ref 1.010–1.025)
Urobilinogen, UA: 0.2 E.U./dL
pH, UA: 6 (ref 5.0–8.0)

## 2018-04-10 MED ORDER — TIOTROPIUM BROMIDE MONOHYDRATE 2.5 MCG/ACT IN AERS: INHALATION_SPRAY | RESPIRATORY_TRACT | 5 refills | Status: DC

## 2018-04-10 NOTE — Patient Instructions (Signed)

## 2018-04-10 NOTE — Progress Notes (Signed)
Subjective:    Patient ID: Claire [name], female    DOB: 10-Jan-1951, 67 y.o.   MRN: [id number]  HPI 67 year old pleasant appearing female presents today for evaluation of dysuria and COPD.  Dysuria- patient experienced burning with urination yesterday. She has not felt this today. She has not tried anything to make it better. She is worried that it could be a UTI, and due to her history of AKI she is wanting to be evaluated. She wears a pad because she will often leak urine when she sneezes and coughs throughout the day. Because of this she will also clean herself with a washcloth with soap and water multiple times. She feels like she might be getting sore because of this. She also uses wet wipes after she uses the restroom. She denies fever, back pain, blood in the urine.  COPD- she is still smoking every once in awhile. She often feels short of breath to the point where she really feels like this is effecting her every day life and ability to be active. She says she will not go out shopping in fear that she will become short of breath. She uses the ProAir inhaler several times per day and the nebulizer once a day. She is taking dulera bid. She feels like she constantly has mucus stuck in the middle of her throat and will constantly clear her throat. She denies chest pain, cough, wheezing.  .. Active Ambulatory Problems    Diagnosis Date Noted  . PTSD (post-traumatic stress disorder) 11/10/2011  . Neurosis, anxiety, panic type 11/10/2011  . Acquired hypothyroidism 11/24/2014  . BP (high blood pressure) 07/24/2012  . Hypothyroid   . Chronic use of benzodiazepine for therapeutic purpose 08/11/2015  . Hyponatremia 12/19/2016  . Hyperkalemia 12/19/2016  . Centrilobular emphysema (HCC) 12/20/2016  . Decreased hemoglobin 12/20/2016  . CKD (chronic kidney disease) stage 3, GFR 30-59 ml/min (HCC) 01/16/2017  . Iron deficiency anemia 01/16/2017  . Anemia of chronic disease 05/11/2017  . Low  serum vitamin B12 05/14/2017  . Vitamin D deficiency 05/14/2017  . Depressed mood 05/14/2017  . High serum parathyroid hormone (PTH) 07/19/2017  . Normocytic anemia 07/19/2017   Resolved Ambulatory Problems    Diagnosis Date Noted  . Acute renal failure (HCC) 12/19/2016  . Acute kidney injury (HCC) 03/17/2017   Past Medical History:  Diagnosis Date  . Acid reflux   . Anxiety   . High blood pressure   . High serum parathyroid hormone (PTH) 07/19/2017  . Hypothyroid   . PTSD (post-traumatic stress disorder)       Review of Systems  All other systems reviewed and are negative.      Objective:   Physical Exam  Constitutional: She is oriented to person, place, and time. She appears well-developed and well-nourished.  HENT:  Head: Normocephalic and atraumatic.  Eyes: Pupils are equal, round, and reactive to light. Conjunctivae and EOM are normal.  Cardiovascular: Normal rate, regular rhythm and normal heart sounds.  Pulmonary/Chest: Effort normal. No respiratory distress. She has no wheezes.  No CVA tenderness.   Abdominal: Soft. Bowel sounds are normal. She exhibits no distension and no mass. There is no tenderness. There is no guarding.  Neurological: She is alert and oriented to person, place, and time.  Skin: Skin is warm and dry.  Psychiatric: She has a normal mood and affect. Her behavior is normal. Thought content normal.      Assessment & Plan:  [name] KitchenMarland KitchenTatayana was seen today for  dysuria.  Diagnoses and all orders for this visit:  Dysuria -     POCT urinalysis dipstick -     Urine Culture  Centrilobular emphysema (HCC) -     Tiotropium Bromide Monohydrate (SPIRIVA RESPIMAT) 2.5 MCG/ACT AERS; Take 2 puffs once a day.  .. Results for orders placed or performed in visit on 04/10/18  POCT urinalysis dipstick  Result Value Ref Range   Color, UA yellow    Clarity, UA clear    Glucose, UA neg    Bilirubin, UA neg    Ketones, UA neg    Spec Grav, UA 1.010 1.010 -  1.025   Blood, UA neg    pH, UA 6.0 5.0 - 8.0   Protein, UA neg    Urobilinogen, UA 0.2 0.2 or 1.0 E.U./dL   Nitrite, UA neg    Leukocytes, UA Negative Negative   Appearance     Odor     Will culture urine but no signs of infection today. Dysuria could be some inflammation from "over cleaning".   Added Spiriva to dulera. She did not want to switch to trelegy as suggested. Follow up in 1 month for spirometry.   [name].Spent 30 minutes with patient and greater than 50 percent of visit spent counseling patient regarding treatment plan.

## 2018-04-10 NOTE — Progress Notes (Signed)
Please culture.

## 2018-04-11 LAB — URINE CULTURE
MICRO NUMBER:: 90557307
Result:: NO GROWTH
SPECIMEN QUALITY:: ADEQUATE

## 2018-04-12 NOTE — Progress Notes (Signed)
Call pt: no growth on urine culture.

## 2018-05-08 ENCOUNTER — Ambulatory Visit (INDEPENDENT_AMBULATORY_CARE_PROVIDER_SITE_OTHER): Payer: Medicare Other | Admitting: Physician Assistant

## 2018-05-08 VITALS — BP 121/71 | HR 74 | Ht 63.0 in | Wt 124.0 lb

## 2018-05-08 DIAGNOSIS — R0609 Other forms of dyspnea: Secondary | ICD-10-CM | POA: Diagnosis not present

## 2018-05-08 DIAGNOSIS — J432 Centrilobular emphysema: Secondary | ICD-10-CM

## 2018-05-08 MED ORDER — ALBUTEROL SULFATE (2.5 MG/3ML) 0.083% IN NEBU
2.5000 mg | INHALATION_SOLUTION | Freq: Once | RESPIRATORY_TRACT | Status: AC
  Administered 2018-05-08: 2.5 mg via RESPIRATORY_TRACT

## 2018-05-08 NOTE — Progress Notes (Signed)
Walk test: Resting on room air: 95% Walking on room air, 2 min: 89%. Resting on 2L o2: 100% Walking on 2L o2, 2 min: 92%

## 2018-05-08 NOTE — Progress Notes (Signed)
   Subjective:    Patient ID: Claire [name], female    DOB: March 08, 1951, 67 y.o.   MRN: [id number]  HPI  Pt is 67 yo female with dx of Centrilobular emphysema with spirometry in 2017 that showed severe obstruction but suspected patient did not give 100 percent because her dentures were falling out. She is a daily smoker and recently put on Spiriva recently and continues on dulera. She has tried other ICS/LABA but she always likes Dulera better. She does feel like she is much more active and less SOB on spiriva. She is washing clothes, going grocery shopping. She finds her triggers are chemicals, perfumes, febreeze. She is using albuterol at least 3 times a day.   .. Active Ambulatory Problems    Diagnosis Date Noted  . PTSD (post-traumatic stress disorder) 11/10/2011  . Neurosis, anxiety, panic type 11/10/2011  . Acquired hypothyroidism 11/24/2014  . BP (high blood pressure) 07/24/2012  . Hypothyroid   . Chronic use of benzodiazepine for therapeutic purpose 08/11/2015  . Hyponatremia 12/19/2016  . Hyperkalemia 12/19/2016  . Centrilobular emphysema (HCC) 12/20/2016  . Decreased hemoglobin 12/20/2016  . CKD (chronic kidney disease) stage 3, GFR 30-59 ml/min (HCC) 01/16/2017  . Iron deficiency anemia 01/16/2017  . Anemia of chronic disease 05/11/2017  . Low serum vitamin B12 05/14/2017  . Vitamin D deficiency 05/14/2017  . Depressed mood 05/14/2017  . High serum parathyroid hormone (PTH) 07/19/2017  . Normocytic anemia 07/19/2017   Resolved Ambulatory Problems    Diagnosis Date Noted  . Acute renal failure (HCC) 12/19/2016  . Acute kidney injury (HCC) 03/17/2017   Past Medical History:  Diagnosis Date  . Acid reflux   . Anxiety   . High blood pressure   . High serum parathyroid hormone (PTH) 07/19/2017  . Hypothyroid   . PTSD (post-traumatic stress disorder)      Review of Systems  All other systems reviewed and are negative.      Objective:   Physical Exam   Constitutional: She is oriented to person, place, and time. She appears well-developed and well-nourished.  HENT:  Head: Normocephalic and atraumatic.  Cardiovascular: Normal rate and regular rhythm.  Pulmonary/Chest: Effort normal and breath sounds normal. She has no wheezes.  Neurological: She is alert and oriented to person, place, and time.  Psychiatric: She has a normal mood and affect. Her behavior is normal.          Assessment & Plan:  [name].[name].Claire [name] was seen today for copd.  Diagnoses and all orders for this visit:  Centrilobular emphysema (HCC) -     albuterol (PROVENTIL) (2.5 MG/3ML) 0.083% nebulizer solution 2.5 mg -     PR EVAL OF BRONCHOSPASM  spirometry shows severe obstruction with [place] 611[address]. PEV1 22 percent. FEV1/FVC 35.6 percent, FEF 25-75 change of 19 percent.   Right now will continue on spiriva and dulera.  Walk test on RA was as low as 89 percent. With 2L went up to 92 percent. See nurses note. She does not qualify for O2; however, walk test was done after albuterol nebulizer. Will repeat at future appt.   I will order O2  Overnight oxymetry.   Pt appears fairly stable for the results of lung function test. I would like for her to see pulmonology and perhaps consider pulmonary rehab.   Pt declined pneumonia vaccine today.   [name]..Spent 30 minutes with patient and greater than 50 percent of visit spent counseling patient regarding treatment plan.

## 2018-05-09 DIAGNOSIS — R0609 Other forms of dyspnea: Secondary | ICD-10-CM | POA: Insufficient documentation

## 2018-05-15 ENCOUNTER — Ambulatory Visit: Payer: Self-pay

## 2018-06-04 ENCOUNTER — Telehealth: Payer: Self-pay | Admitting: Physician Assistant

## 2018-06-04 NOTE — Telephone Encounter (Signed)
Called patient and informed her of PA instructions. She will go and get elderberry and start taking it to help build immunity. KG LPN

## 2018-06-04 NOTE — Telephone Encounter (Signed)
Unfortunately we do not like to give antibiotics that way and protocol states we need an office visit.  Studies show greater resistance to bacteria forming. Take some airborne vitamin C. Wash hands. Hopefully you will not get it. Of course you are more prone to getting sick with COPD.

## 2018-06-04 NOTE — Telephone Encounter (Signed)
Patient calls and states that her grandson is sick with cough and congestion and put on antibiotic by his physician and she wants to know if you would send her in an antibiotic as prophylaxis so she doesn't get sick with her COPD.

## 2018-06-10 ENCOUNTER — Other Ambulatory Visit: Payer: Self-pay | Admitting: Physician Assistant

## 2018-06-11 ENCOUNTER — Other Ambulatory Visit: Payer: Self-pay | Admitting: *Deleted

## 2018-06-21 ENCOUNTER — Telehealth (HOSPITAL_COMMUNITY): Payer: Self-pay

## 2018-06-21 MED ORDER — CLONAZEPAM 0.5 MG PO TABS: ORAL_TABLET | ORAL | 0 refills | Status: DC

## 2018-06-21 NOTE — Telephone Encounter (Signed)
Patient wants to know if her Clonazepam can be refilled.

## 2018-06-21 NOTE — Telephone Encounter (Signed)
Sent in a one month supply of medication to pharmacy per Dr. Gilmore LarocheAkhtar.

## 2018-06-21 NOTE — Telephone Encounter (Signed)
Please check if due and we can refill

## 2018-06-25 ENCOUNTER — Ambulatory Visit (HOSPITAL_COMMUNITY): Payer: Self-pay | Admitting: Psychiatry

## 2018-06-28 ENCOUNTER — Ambulatory Visit (INDEPENDENT_AMBULATORY_CARE_PROVIDER_SITE_OTHER): Payer: Medicare Other | Admitting: Psychiatry

## 2018-06-28 ENCOUNTER — Encounter (HOSPITAL_COMMUNITY): Payer: Self-pay | Admitting: Psychiatry

## 2018-06-28 ENCOUNTER — Other Ambulatory Visit: Payer: Self-pay

## 2018-06-28 VITALS — BP 102/80 | HR 88 | Ht 63.0 in | Wt 124.0 lb

## 2018-06-28 DIAGNOSIS — Z818 Family history of other mental and behavioral disorders: Secondary | ICD-10-CM

## 2018-06-28 DIAGNOSIS — F410 Panic disorder [episodic paroxysmal anxiety] without agoraphobia: Secondary | ICD-10-CM | POA: Diagnosis not present

## 2018-06-28 DIAGNOSIS — Z811 Family history of alcohol abuse and dependence: Secondary | ICD-10-CM

## 2018-06-28 DIAGNOSIS — F411 Generalized anxiety disorder: Secondary | ICD-10-CM | POA: Diagnosis not present

## 2018-06-28 DIAGNOSIS — R450 Nervousness: Secondary | ICD-10-CM

## 2018-06-28 DIAGNOSIS — F4310 Post-traumatic stress disorder, unspecified: Secondary | ICD-10-CM

## 2018-06-28 MED ORDER — CLONAZEPAM 0.5 MG PO TABS: ORAL_TABLET | ORAL | 0 refills | Status: DC

## 2018-06-28 NOTE — Progress Notes (Signed)
Patient ID: Claire [name], female   DOB: [birth year], 67 y.o.   MRN: [id number]  [facility] Outpatient Follow up visit  Claire [name] [id number] 67 y.o.  06/28/2018 1:57 PM  Chief Complaint:  Establish care for panic attacks  History of Present Illness:   Patient Presents for follow up and medication management for  panic symptoms. Diagnosed with PTSD, panic symptoms and GAD.  Conflicts with daughter stressful She played a recording of her daughter yelling and cursing to her regarding dish washer use  Says she is moving and will stay away from her daughter. X husband will help her.  klonopine helps anxiety   Understand side effects of benzo and effect on memory   Modifying factor: breahting techniques.grandkids   PTSD is related to her past some flashbacks. Distraction helps Depression is baseline No side effects     Aggravating factors:husband illness. Living with daughter   No psychosis  Medical History; Past Medical History:  Diagnosis Date  . Acid reflux   . Anxiety   . High blood pressure   . High serum parathyroid hormone (PTH) 07/19/2017   96 06/28/2014, 162 12/29/16  . Hypothyroid   . PTSD (post-traumatic stress disorder)     Allergies: Allergies  Allergen Reactions  . Macrodantin [Nitrofurantoin Macrocrystal] Rash  . Bactrim [Sulfamethoxazole-Trimethoprim]     ACUTE RENAL fAILURE/HYPONATREMIA/HYPERKALEMIA  . Zithromax [Azithromycin]     Medications: Outpatient Encounter Medications as of 06/28/2018  Medication Sig  . albuterol (PROVENTIL HFA;VENTOLIN HFA) 108 (90 Base) MCG/ACT inhaler Inhale 2 puffs into the lungs every 4 (four) hours as needed for wheezing.  [name] albuterol (PROVENTIL) (2.5 MG/3ML) 0.083% nebulizer solution Take 3 mLs (2.5 mg total) by nebulization every 4 (four) hours as needed for wheezing or shortness of breath.  [name] atenolol (TENORMIN) 50 MG tablet TAKE 1.5 TABLETS (75 MG TOTAL) BY MOUTH DAILY.  . Calcium  Carbonate-Vitamin D (CALCIUM-VITAMIN D) 500-200 MG-UNIT per tablet Take 1 tablet by mouth daily.  . clonazePAM (KLONOPIN) 0.5 MG tablet TAKE 1 TABLET BY MOUTH 3 TIMES A DAY  Fill when due after checking last refill  . Cobalamine Combinations (B-12) [phone] MCG SUBL Place under the tongue.  . Cyanocobalamin (B-12) 500 MCG SUBL Place 1,000 mcg under the tongue daily.  . ferrous sulfate 325 (65 FE) MG EC tablet 1 tab PO TID every other day  . levothyroxine (SYNTHROID, LEVOTHROID) 75 MCG tablet TAKE 1 TABLET BY MOUTH EVERY DAY  . MAGNESIUM PO Take by mouth.  . mometasone-formoterol (DULERA) 100-5 MCG/ACT AERO Inhale 2 puffs into the lungs 2 (two) times daily.  . ranitidine (ZANTAC) 150 MG tablet Take 1 tablet (150 mg total) by mouth 2 (two) times daily.  . Tiotropium Bromide Monohydrate (SPIRIVA RESPIMAT) 2.5 MCG/ACT AERS Take 2 puffs once a day.  . [DISCONTINUED] clonazePAM (KLONOPIN) 0.5 MG tablet TAKE 1 TABLET BY MOUTH 3 TIMES A DAY   No facility-administered encounter medications on file as of 06/28/2018.     Family History; Family History  Problem Relation Age of Onset  . Anxiety disorder Mother   . OCD Daughter   . Alcohol abuse Father   . Anxiety disorder Sister        Labs:  Recent Results (from the past 2160 hour(s))  POCT urinalysis dipstick     Status: None   Collection Time: 04/10/18  2:17 PM  Result Value Ref Range   Color, UA yellow    Clarity, UA clear    Glucose,  UA neg    Bilirubin, UA neg    Ketones, UA neg    Spec Grav, UA 1.010 1.010 - 1.025   Blood, UA neg    pH, UA 6.0 5.0 - 8.0   Protein, UA neg    Urobilinogen, UA 0.2 0.2 or 1.0 E.U./dL   Nitrite, UA neg    Leukocytes, UA Negative Negative   Appearance     Odor    Urine Culture     Status: None   Collection Time: 04/10/18  2:37 PM  Result Value Ref Range   MICRO NUMBER: [id number]    SPECIMEN QUALITY: ADEQUATE    Sample Source URINE    STATUS: FINAL    Result: No Growth       Mental  Status Examination;   Psychiatric Specialty Exam: Physical Exam  Constitutional: She appears well-nourished.  Skin: She is not diaphoretic.    Review of Systems  Constitutional: Negative for fever.  Cardiovascular: Negative for chest pain and palpitations.  Gastrointestinal: Negative for nausea.  Skin: Negative for rash.  Neurological: Negative for tremors and headaches.  Psychiatric/Behavioral: Negative for suicidal ideas. The patient is nervous/anxious.     Blood pressure 102/80, pulse 88, height 5\' 3"  (1.6 m), weight 124 lb (56.2 kg).Body mass index is 21.97 kg/m.  General Appearance: Casual  Eye Contact::  Fair  Speech:  Slow  Volume:  Decreased  Mood: somewhat stressed  Affect:  congruent  Thought Process:  Coherent and intact  Orientation:  Full (Time, Place, and Person)  Thought Content:  Rumination  Suicidal Thoughts:  No  Homicidal Thoughts:  No  Memory:  Immediate;   Fair Recent;   Fair  Judgement:  Fair  Insight:  Shallow  Psychomotor Activity:  Normal  Concentration:  Fair  Recall:  Fair  Akathisia:  Negative  Handed:  Right  AIMS (if indicated):     Assets:  Social Support Vocational/Educational  Sleep:        Assessment: Axis I: Panic disorder. Possible PTSD. Rule out generalized anxiety disorder. Nicotine dependence  Axis II: Deferred  Axis III:  Past Medical History:  Diagnosis Date  . Acid reflux   . Anxiety   . High blood pressure   . High serum parathyroid hormone (PTH) 07/19/2017   96 06/28/2014, 162 12/29/16  . Hypothyroid   . PTSD (post-traumatic stress disorder)     Axis IV: Psychosocial. History of trauma   Treatment Plan and Summary:  Anxiety and GAD: fluctuates. klonopine helps does not want to stop Avoid alcohodl Looking to live seperately soon PTSD: baseline. continuie disraction techniques Does not want to be on sSRi  But just klonopine   Panic attacks:infequent Continue klonopine. Cautioned about side effects and  tolerance FU 2-3 months  Thresa RossNadeem Daritza Brees, MD 06/28/2018

## 2018-07-04 ENCOUNTER — Ambulatory Visit (INDEPENDENT_AMBULATORY_CARE_PROVIDER_SITE_OTHER): Payer: Medicare Other | Admitting: Physician Assistant

## 2018-07-04 ENCOUNTER — Encounter: Payer: Self-pay | Admitting: Physician Assistant

## 2018-07-04 VITALS — BP 109/74 | HR 72 | Ht 63.0 in | Wt 123.0 lb

## 2018-07-04 DIAGNOSIS — R0902 Hypoxemia: Secondary | ICD-10-CM

## 2018-07-04 DIAGNOSIS — D631 Anemia in chronic kidney disease: Secondary | ICD-10-CM

## 2018-07-04 DIAGNOSIS — D638 Anemia in other chronic diseases classified elsewhere: Secondary | ICD-10-CM

## 2018-07-04 DIAGNOSIS — E039 Hypothyroidism, unspecified: Secondary | ICD-10-CM

## 2018-07-04 DIAGNOSIS — N183 Chronic kidney disease, stage 3 unspecified: Secondary | ICD-10-CM

## 2018-07-04 DIAGNOSIS — E871 Hypo-osmolality and hyponatremia: Secondary | ICD-10-CM | POA: Diagnosis not present

## 2018-07-04 DIAGNOSIS — J432 Centrilobular emphysema: Secondary | ICD-10-CM | POA: Diagnosis not present

## 2018-07-04 NOTE — Progress Notes (Signed)
Subjective:    Patient ID: Claire [name], female    DOB: [birth year], 67 y.o.   MRN: [id number]  HPI  Pt is a 67 yo with COPD, hypothyroidism, anemia of chronic disease, CKD who presents to the clinic for follow up.   Pt just "feels" it is time to recheck her kidney function, hemoglobin.  She was seen for a UTI a few weeks ago. She was given keflex. Symptoms have resolved. No fever, chills, headache, flank pain, dysuria.   Her mood has improved since moving out of her daughters house.   Last visit she was told how severe her COPD was. She declined overnight O2 testing. She did not qualify for ambulatory O2 but it was right after a nebulizer treatment. She struggles to walk due to SOB. She would like to repeat testing today. Pt continues to smoke cigarettes.   .. Active Ambulatory Problems    Diagnosis Date Noted  . PTSD (post-traumatic stress disorder) 11/10/2011  . Neurosis, anxiety, panic type 11/10/2011  . Acquired hypothyroidism 11/24/2014  . BP (high blood pressure) 07/24/2012  . Hypothyroid   . Chronic use of benzodiazepine for therapeutic purpose 08/11/2015  . Hyponatremia 12/19/2016  . Hyperkalemia 12/19/2016  . Centrilobular emphysema (HCC) 12/20/2016  . Decreased hemoglobin 12/20/2016  . CKD (chronic kidney disease) stage 3, GFR 30-59 ml/min (HCC) 01/16/2017  . Iron deficiency anemia 01/16/2017  . Anemia of chronic disease 05/11/2017  . Low serum vitamin B12 05/14/2017  . Vitamin D deficiency 05/14/2017  . Depressed mood 05/14/2017  . High serum parathyroid hormone (PTH) 07/19/2017  . Normocytic anemia 07/19/2017  . DOE (dyspnea on exertion) 05/09/2018   Resolved Ambulatory Problems    Diagnosis Date Noted  . Acute renal failure (HCC) 12/19/2016  . Acute kidney injury (HCC) 03/17/2017   Past Medical History:  Diagnosis Date  . Acid reflux   . Anxiety   . High blood pressure   . High serum parathyroid hormone (PTH) 07/19/2017  . Hypothyroid   . PTSD  (post-traumatic stress disorder)       Review of Systems    see HPI.  Objective:   Physical Exam  Constitutional: She is oriented to person, place, and time. She appears well-developed and well-nourished.  HENT:  Head: Normocephalic and atraumatic.  Eyes: Pupils are equal, round, and reactive to light.  Neck: Normal range of motion. Neck supple.  Cardiovascular: Normal rate and regular rhythm.  Pulmonary/Chest: Effort normal and breath sounds normal.  Neurological: She is alert and oriented to person, place, and time.  Psychiatric: She has a normal mood and affect. Her behavior is normal.          Assessment & Plan:  [name].[name].Diagnoses and all orders for this visit:  Centrilobular emphysema (HCC) -     AMBULATORY NON FORMULARY MEDICATION; Continuous stationary and portable oxygen tanks for use with ambulation. DX: COPD with hypoxia.  CKD (chronic kidney disease) stage 3, GFR 30-59 ml/min (HCC) -     COMPLETE METABOLIC PANEL WITH GFR -     COMPLETE METABOLIC PANEL WITH GFR  Acquired hypothyroidism -     TSH  Anemia due to stage 3 chronic kidney disease (HCC) -     CBC with Differential/Platelet  Hyponatremia  Anemia of chronic disease  Hypoxia -     AMBULATORY NON FORMULARY MEDICATION; Continuous stationary and portable oxygen tanks for use with ambulation. DX: COPD with hypoxia.   6 minute walk test on room air after 3 minutes  dropped to 86 percent pulse ox.  With 2L of O2 increased to 97 percent without exertion.  With 2L of O2 stablized at 94 percent with exertion.  Order to be placed for O2 for ambulation.   Labs ordered for recheck of ongoing conditions.   [name].Spent 30 minutes with patient and greater than 50 percent of visit spent counseling patient regarding treatment plan.

## 2018-07-04 NOTE — Patient Instructions (Signed)
Will get setup for O2.

## 2018-07-05 ENCOUNTER — Telehealth: Payer: Self-pay | Admitting: Physician Assistant

## 2018-07-05 DIAGNOSIS — E871 Hypo-osmolality and hyponatremia: Secondary | ICD-10-CM

## 2018-07-05 DIAGNOSIS — E039 Hypothyroidism, unspecified: Secondary | ICD-10-CM

## 2018-07-05 LAB — CBC WITH DIFFERENTIAL/PLATELET
BASOS ABS: 48 {cells}/uL (ref 0–200)
Basophils Relative: 0.7 %
EOS ABS: 109 {cells}/uL (ref 15–500)
EOS PCT: 1.6 %
HCT: 28 % — ABNORMAL LOW (ref 35.0–45.0)
Hemoglobin: 10 g/dL — ABNORMAL LOW (ref 11.7–15.5)
LYMPHS ABS: 1843 {cells}/uL (ref 850–3900)
MCH: 33.7 pg — AB (ref 27.0–33.0)
MCHC: 35.7 g/dL (ref 32.0–36.0)
MCV: 94.3 fL (ref 80.0–100.0)
MPV: 9.2 fL (ref 7.5–12.5)
Monocytes Relative: 9.3 %
NEUTROS PCT: 61.3 %
Neutro Abs: 4168 cells/uL (ref 1500–7800)
PLATELETS: 290 10*3/uL (ref 140–400)
RBC: 2.97 10*6/uL — ABNORMAL LOW (ref 3.80–5.10)
RDW: 11.7 % (ref 11.0–15.0)
Total Lymphocyte: 27.1 %
WBC mixed population: 632 cells/uL (ref 200–950)
WBC: 6.8 10*3/uL (ref 3.8–10.8)

## 2018-07-05 LAB — COMPLETE METABOLIC PANEL WITH GFR
AG Ratio: 1.6 (calc) (ref 1.0–2.5)
ALKALINE PHOSPHATASE (APISO): 72 U/L (ref 33–130)
ALT: 7 U/L (ref 6–29)
AST: 16 U/L (ref 10–35)
Albumin: 4.5 g/dL (ref 3.6–5.1)
BILIRUBIN TOTAL: 0.5 mg/dL (ref 0.2–1.2)
BUN/Creatinine Ratio: 12 (calc) (ref 6–22)
BUN: 14 mg/dL (ref 7–25)
CHLORIDE: 93 mmol/L — AB (ref 98–110)
CO2: 32 mmol/L (ref 20–32)
Calcium: 9.6 mg/dL (ref 8.6–10.4)
Creat: 1.13 mg/dL — ABNORMAL HIGH (ref 0.50–0.99)
GFR, Est African American: 58 mL/min/{1.73_m2} — ABNORMAL LOW (ref >=60)
GFR, Est Non African American: 50 mL/min/{1.73_m2} — ABNORMAL LOW (ref >=60)
GLUCOSE: 95 mg/dL (ref 65–99)
Globulin: 2.9 g/dL (calc) (ref 1.9–3.7)
POTASSIUM: 4.5 mmol/L (ref 3.5–5.3)
Sodium: 131 mmol/L — ABNORMAL LOW (ref 135–146)
Total Protein: 7.4 g/dL (ref 6.1–8.1)

## 2018-07-05 LAB — TSH: TSH: 5.71 m[IU]/L — AB (ref 0.40–4.50)

## 2018-07-05 MED ORDER — AMBULATORY NON FORMULARY MEDICATION: 11 refills | Status: DC

## 2018-07-05 NOTE — Telephone Encounter (Signed)
Pt called. She wants to know her lab results.

## 2018-07-05 NOTE — Progress Notes (Signed)
Call pt: thyroid is elevated. Have you been taking thyroid medication the same way? In the morning with no other medication? If so we will need to increase a bit and then recheck in 4-6 weeks.   Hemoglobin is stable dropped just a hair.  Kidney function is MUCH better! Renee RivalYay! Great news.   Your sodium is low. Could be from your recent infection and abx. You be some slight dehydration. Drink fluids and increase salt for a few days. Lets recheck in 2 weeks your sodium level.

## 2018-07-06 NOTE — Telephone Encounter (Signed)
Wait at least 3 weeks.

## 2018-07-06 NOTE — Telephone Encounter (Signed)
Pt was not taking TSH Rx daily. She will start. Labs placed for 2 week recheck.

## 2018-07-06 NOTE — Telephone Encounter (Signed)
Pt advised of results, verbalized understanding. No further questions.  

## 2018-07-09 ENCOUNTER — Telehealth: Payer: Self-pay | Admitting: Osteopathic Medicine

## 2018-07-09 MED ORDER — AMBULATORY NON FORMULARY MEDICATION: 1 refills | Status: DC

## 2018-07-09 NOTE — Telephone Encounter (Signed)
I don't have a problem ordering this device but her insurance may not cover it. Has she called her insurance carrier about this? Rx written, need to know where it can be sent (home health or med supply company?)

## 2018-07-09 NOTE — Telephone Encounter (Signed)
Dr. Margarita RanaAlexander   Rosaisela Pineiro called she is a [facility] patient who requires oxygen only when she is out shopping or out to eat with her family. [facility] ordered the oxygen but apparently it is not what Ms. Bruss thought she was actually wanting one of those battery type packs that you carry while you are out instead of tanks. She stated that all of the tanks in her room makes her feel uncomfortable and was wondering is she could get the one her and Lesly RubensteinJade discussed ordered. I told her I would send you a note that Lesly RubensteinJade was out of the office until next week but she may have to wait until Lesly RubensteinJade returns. Please advise.   Arline Aspindy

## 2018-07-09 NOTE — Telephone Encounter (Signed)
Pt uses [facility], would like order sent there. Will fax.

## 2018-07-10 NOTE — Telephone Encounter (Signed)
2nd fax sent to [facility]. Confirmation received.Heath GoldBarkley, Kelby Adell Lynetta, CMA

## 2018-07-10 NOTE — Telephone Encounter (Signed)
Pt called back to check the status of the portable O2 tank. I informed her that according to the phone note this [facility] She stated that they have not received the order for this and that [facility] will need an additional order for a titration test for a portable O2 concentrator in order to get this and she would have to get this done thru their company.. She had advised them that this had already been done and didn't understand why this would need to be repeated in order for her to get what she needs and all of this was sent in previously.  Found Rx that Dr. Lyn HollingsheadAlexander wrote for concentrator will fax with OV notes.

## 2018-07-12 ENCOUNTER — Other Ambulatory Visit: Payer: Self-pay | Admitting: Sports Medicine

## 2018-07-12 MED ORDER — AMBULATORY NON FORMULARY MEDICATION: 1 refills | Status: DC

## 2018-07-13 ENCOUNTER — Telehealth: Payer: Self-pay

## 2018-07-13 DIAGNOSIS — R0902 Hypoxemia: Secondary | ICD-10-CM

## 2018-07-13 DIAGNOSIS — J432 Centrilobular emphysema: Secondary | ICD-10-CM

## 2018-07-13 NOTE — Telephone Encounter (Signed)
Pt called stating that she is having some issues with [facility] and her oxygen. Pt is wanting some portable oxygen and pt is upset that she has to have a walk test done by [facility] in order to get it.   Pt states she called [facility] and they told her that if she calls [facility] and cancels her current order, that [facility] will send the orders to [facility] and they can take over her oxygen.   Pt states she will be calling [facility] and cancelling order and have them send the order to [facility].  No need at this time from the office.

## 2018-07-13 NOTE — Progress Notes (Signed)
Ok recheck both thyroid and sodium in 2 weeks.

## 2018-07-16 ENCOUNTER — Other Ambulatory Visit: Payer: Self-pay

## 2018-07-16 DIAGNOSIS — E039 Hypothyroidism, unspecified: Secondary | ICD-10-CM

## 2018-07-16 DIAGNOSIS — E871 Hypo-osmolality and hyponatremia: Secondary | ICD-10-CM

## 2018-07-16 MED ORDER — AMBULATORY NON FORMULARY MEDICATION: 1 refills | Status: DC

## 2018-07-16 MED ORDER — AMBULATORY NON FORMULARY MEDICATION: 11 refills | Status: AC

## 2018-07-16 NOTE — Addendum Note (Signed)
Addended by: Jed LimerickHODGES, Aslee Such on: 07/16/2018 01:46 PM   Modules accepted: Orders

## 2018-07-16 NOTE — Telephone Encounter (Signed)
Pt called back, [facility] would not transfer orders. Pt would like new orders sent to [facility].  Orders, demographics, insurance card, and last OV with walk test faxed to [facility]

## 2018-07-16 NOTE — Progress Notes (Signed)
Per labs labs:  "Notes recorded by Jomarie LongsBreeback, Jade L, PA-C on 07/13/2018 at 1:10 AM EDT Ok recheck both thyroid and sodium in 2 weeks."

## 2018-07-17 NOTE — Telephone Encounter (Signed)
Ok to write up order with Dx. Of hypoxia and COPD.

## 2018-07-17 NOTE — Addendum Note (Signed)
Addended by: Chalmers CaterUTTLE, Penny Frisbie H on: 07/17/2018 03:33 PM   Modules accepted: Orders

## 2018-07-17 NOTE — Telephone Encounter (Addendum)
Claire [name] wants a [employer] portable concentrator. She would like it sent to [facility].

## 2018-07-18 MED ORDER — AMBULATORY NON FORMULARY MEDICATION: 1 refills | Status: DC

## 2018-07-18 NOTE — Addendum Note (Signed)
Addended by: Jed LimerickHODGES, Jazziel Fitzsimmons on: 07/18/2018 11:16 AM   Modules accepted: Orders

## 2018-07-18 NOTE — Telephone Encounter (Signed)
Pt need resting O2 level on room air within last 30 days, one was not documented at last OV.  Pt scheduled for nurse visit to have this done

## 2018-07-18 NOTE — Telephone Encounter (Signed)
Form filled out by [employer].

## 2018-07-20 ENCOUNTER — Ambulatory Visit (INDEPENDENT_AMBULATORY_CARE_PROVIDER_SITE_OTHER): Payer: Medicare Other | Admitting: Family Medicine

## 2018-07-20 VITALS — BP 119/79 | HR 93 | Wt 123.0 lb

## 2018-07-20 DIAGNOSIS — J432 Centrilobular emphysema: Secondary | ICD-10-CM | POA: Diagnosis not present

## 2018-07-20 NOTE — Progress Notes (Signed)
Agree with documentation as above.   Catherine Metheney, MD  

## 2018-07-20 NOTE — Progress Notes (Signed)
Claire [name] presents to clinic for vitals to be checked for home health oxygen order. -EH/RMA

## 2018-07-31 LAB — COMPLETE METABOLIC PANEL WITH GFR
AG Ratio: 1.4 (calc) (ref 1.0–2.5)
ALBUMIN MSPROF: 4.3 g/dL (ref 3.6–5.1)
ALT: 5 U/L — ABNORMAL LOW (ref 6–29)
AST: 13 U/L (ref 10–35)
Alkaline phosphatase (APISO): 76 U/L (ref 33–130)
BUN / CREAT RATIO: 9 (calc) (ref 6–22)
BUN: 10 mg/dL (ref 7–25)
CALCIUM: 9.6 mg/dL (ref 8.6–10.4)
CO2: 35 mmol/L — AB (ref 20–32)
CREATININE: 1.09 mg/dL — AB (ref 0.50–0.99)
Chloride: 97 mmol/L — ABNORMAL LOW (ref 98–110)
GFR, EST AFRICAN AMERICAN: 61 mL/min/{1.73_m2} (ref >=60)
GFR, EST NON AFRICAN AMERICAN: 52 mL/min/{1.73_m2} — AB (ref >=60)
GLOBULIN: 3.1 g/dL (ref 1.9–3.7)
Glucose, Bld: 101 mg/dL — ABNORMAL HIGH (ref 65–99)
Potassium: 4.1 mmol/L (ref 3.5–5.3)
SODIUM: 136 mmol/L (ref 135–146)
TOTAL PROTEIN: 7.4 g/dL (ref 6.1–8.1)
Total Bilirubin: 0.4 mg/dL (ref 0.2–1.2)

## 2018-07-31 LAB — TSH: TSH: 2.15 mIU/L (ref 0.40–4.50)

## 2018-07-31 NOTE — Progress Notes (Signed)
Call pt: kidney function continues to improve! Wonderful news. Sodium now in normal range which is great. Thyroid also in normal range. Wonderful labs.

## 2018-08-01 ENCOUNTER — Encounter: Payer: Self-pay | Admitting: Physician Assistant

## 2018-08-01 ENCOUNTER — Ambulatory Visit (INDEPENDENT_AMBULATORY_CARE_PROVIDER_SITE_OTHER): Payer: Medicare Other | Admitting: Physician Assistant

## 2018-08-01 VITALS — BP 97/69 | HR 74 | Ht 63.0 in | Wt 123.0 lb

## 2018-08-01 DIAGNOSIS — E871 Hypo-osmolality and hyponatremia: Secondary | ICD-10-CM | POA: Diagnosis not present

## 2018-08-01 DIAGNOSIS — E039 Hypothyroidism, unspecified: Secondary | ICD-10-CM

## 2018-08-01 DIAGNOSIS — I10 Essential (primary) hypertension: Secondary | ICD-10-CM

## 2018-08-01 DIAGNOSIS — J432 Centrilobular emphysema: Secondary | ICD-10-CM

## 2018-08-01 DIAGNOSIS — N183 Chronic kidney disease, stage 3 unspecified: Secondary | ICD-10-CM

## 2018-08-01 DIAGNOSIS — R031 Nonspecific low blood-pressure reading: Secondary | ICD-10-CM

## 2018-08-01 MED ORDER — AMBULATORY NON FORMULARY MEDICATION: 0 refills | Status: DC

## 2018-08-01 MED ORDER — ATENOLOL 25 MG PO TABS
25.0000 mg | ORAL_TABLET | Freq: Every day | ORAL | 1 refills | Status: DC
Start: 2018-08-01 — End: 2019-01-02

## 2018-08-01 NOTE — Progress Notes (Signed)
Subjective:    Patient ID: Claire [name], female    DOB: 1951-03-01, 67 y.o.   MRN: [id number]  HPI  Patient is a 67 year old female with a past medical history of COPD, hypothyroidism, CKD 3, hypertension and low sodium.  She appears months to follow-up on labs.  Her labs improved significantly.  Her serum creatinine continues to improve every recheck.  Her sodium was back into normal range.  Her thyroid is perfect.  COPD-patient still has not had her portable oxygen tank.  There is a specific wound but she is willing to carry.  She does not want to use the cancers in her home.  She only wants the portable model.  She is found company [employer] that she thinks she can get the specific model through.  She requested a prescription.  Her goal is to be able to go shopping to the mom and do more exercise.  Pt feels great and doing well. No active complaints today.   .. Active Ambulatory Problems    Diagnosis Date Noted  . PTSD (post-traumatic stress disorder) 11/10/2011  . Neurosis, anxiety, panic type 11/10/2011  . Acquired hypothyroidism 11/24/2014  . BP (high blood pressure) 07/24/2012  . Hypothyroid   . Chronic use of benzodiazepine for therapeutic purpose 08/11/2015  . Hyponatremia 12/19/2016  . Hyperkalemia 12/19/2016  . Centrilobular emphysema (HCC) 12/20/2016  . Decreased hemoglobin 12/20/2016  . CKD (chronic kidney disease) stage 3, GFR 30-59 ml/min (HCC) 01/16/2017  . Iron deficiency anemia 01/16/2017  . Anemia of chronic disease 05/11/2017  . Low serum vitamin B12 05/14/2017  . Vitamin D deficiency 05/14/2017  . Depressed mood 05/14/2017  . High serum parathyroid hormone (PTH) 07/19/2017  . Normocytic anemia 07/19/2017  . DOE (dyspnea on exertion) 05/09/2018   Resolved Ambulatory Problems    Diagnosis Date Noted  . Acute renal failure (HCC) 12/19/2016  . Acute kidney injury (HCC) 03/17/2017   Past Medical History:  Diagnosis Date  . Acid reflux   . Anxiety   . High  blood pressure       Review of Systems See HPI.     Objective:   Physical Exam  Constitutional: She is oriented to person, place, and time. She appears well-developed and well-nourished.  HENT:  Head: Normocephalic and atraumatic.  Cardiovascular: Normal rate and regular rhythm.  Pulmonary/Chest: Effort normal and breath sounds normal. She has no wheezes.  Neurological: She is alert and oriented to person, place, and time.  Psychiatric: She has a normal mood and affect. Her behavior is normal.          Assessment & Plan:   [name].[name].Diagnoses and all orders for this visit:  Acquired hypothyroidism  Centrilobular emphysema (HCC) -     AMBULATORY NON FORMULARY MEDICATION; Home concentrator and one [employer] G3 portable oxygen concentrator for hypoxia.  CKD (chronic kidney disease) stage 3, GFR 30-59 ml/min (HCC)  Hyponatremia  Essential hypertension -     atenolol (TENORMIN) 25 MG tablet; Take 1 tablet (25 mg total) by mouth daily.  Low blood pressure reading  reassurance given with labs. Stay on same doses and same medications. Great news that CKD improving and very stable.   Low BP today. C/o of dizziness with standing. Decrease atenolol to 25mg  daily. Follow up in 1 month.   Wrote rx for portable O2. Faxed today.   Pt declined flu shot and pneumonia shot. She is aware of risk.   [name]..Spent 30 minutes with patient and greater than 50  percent of visit spent counseling patient regarding treatment plan.

## 2018-08-01 NOTE — Patient Instructions (Addendum)
Start atenolol 25mg  daily.  Nurse visit 3-4 weeks for BP REcheck.

## 2018-08-02 ENCOUNTER — Encounter: Payer: Self-pay | Admitting: Physician Assistant

## 2018-08-05 ENCOUNTER — Other Ambulatory Visit: Payer: Self-pay | Admitting: Physician Assistant

## 2018-08-09 ENCOUNTER — Other Ambulatory Visit: Payer: Self-pay | Admitting: Physician Assistant

## 2018-08-09 DIAGNOSIS — J432 Centrilobular emphysema: Secondary | ICD-10-CM

## 2018-08-23 ENCOUNTER — Telehealth (HOSPITAL_COMMUNITY): Payer: Self-pay

## 2018-08-23 MED ORDER — CLONAZEPAM 0.5 MG PO TABS: ORAL_TABLET | ORAL | 0 refills | Status: DC

## 2018-08-23 NOTE — Telephone Encounter (Signed)
Called patient so she could stop by to pick up prescription, but patient is currently out of town. [facility] [address] spoke with [place]

## 2018-08-23 NOTE — Telephone Encounter (Signed)
Patient called requesting a refill on Klonopin 0.5mg  tabs. Next appointment is scheduled for 09-27-18.    [facility] [address] [place]

## 2018-08-23 NOTE — Telephone Encounter (Signed)
Printed klonopine for pickup. Cannot send electonically for now

## 2018-08-24 ENCOUNTER — Other Ambulatory Visit: Payer: Self-pay

## 2018-08-24 MED ORDER — AMBULATORY NON FORMULARY MEDICATION: 1 refills | Status: DC

## 2018-09-01 ENCOUNTER — Other Ambulatory Visit: Payer: Self-pay | Admitting: Physician Assistant

## 2018-09-01 DIAGNOSIS — J432 Centrilobular emphysema: Secondary | ICD-10-CM

## 2018-09-03 NOTE — Telephone Encounter (Signed)
Called pharmacy, verbal order given.  

## 2018-09-10 ENCOUNTER — Other Ambulatory Visit: Payer: Self-pay | Admitting: Physician Assistant

## 2018-09-10 DIAGNOSIS — J432 Centrilobular emphysema: Secondary | ICD-10-CM

## 2018-09-12 ENCOUNTER — Encounter: Payer: Self-pay | Admitting: Physician Assistant

## 2018-09-12 ENCOUNTER — Ambulatory Visit (INDEPENDENT_AMBULATORY_CARE_PROVIDER_SITE_OTHER): Payer: Medicare Other | Admitting: Physician Assistant

## 2018-09-12 VITALS — BP 113/72 | HR 85 | Temp 97.6°F | Ht 63.0 in | Wt 119.0 lb

## 2018-09-12 DIAGNOSIS — J432 Centrilobular emphysema: Secondary | ICD-10-CM

## 2018-09-12 DIAGNOSIS — J0140 Acute pansinusitis, unspecified: Secondary | ICD-10-CM | POA: Diagnosis not present

## 2018-09-12 MED ORDER — AMOXICILLIN-POT CLAVULANATE 875-125 MG PO TABS: 1.0000 | ORAL_TABLET | Freq: Two times a day (BID) | ORAL | 0 refills | Status: DC

## 2018-09-12 MED ORDER — PREDNISONE 50 MG PO TABS: ORAL_TABLET | ORAL | 0 refills | Status: DC

## 2018-09-12 MED ORDER — FLUTICASONE PROPIONATE 50 MCG/ACT NA SUSP: 2.0000 | Freq: Every day | NASAL | 6 refills | Status: DC

## 2018-09-12 NOTE — Progress Notes (Signed)
Subjective:    Patient ID: Claire [name], female    DOB: 1951/12/02, 67 y.o.   MRN: [id number]  HPI Patient is a 67 year old female with emphysema, hypothyroidism, hypertension, CKD 3 who presents to the clinic with sinus pressure, headache, ear popping.  Her shortness of breath stable.  She has a chronic cough and it has become slightly more productive.  She has been experiencing symptoms for the last 3 to 4 days.  She is taking a little bit of Claritin which helped minimally at first.  She does feel like she is worsening.  She is concerned about her lung function when she gets sick.  .. Active Ambulatory Problems    Diagnosis Date Noted  . PTSD (post-traumatic stress disorder) 11/10/2011  . Neurosis, anxiety, panic type 11/10/2011  . Acquired hypothyroidism 11/24/2014  . BP (high blood pressure) 07/24/2012  . Hypothyroid   . Chronic use of benzodiazepine for therapeutic purpose 08/11/2015  . Hyponatremia 12/19/2016  . Hyperkalemia 12/19/2016  . Centrilobular emphysema (HCC) 12/20/2016  . Decreased hemoglobin 12/20/2016  . CKD (chronic kidney disease) stage 3, GFR 30-59 ml/min (HCC) 01/16/2017  . Iron deficiency anemia 01/16/2017  . Anemia of chronic disease 05/11/2017  . Low serum vitamin B12 05/14/2017  . Vitamin D deficiency 05/14/2017  . Depressed mood 05/14/2017  . High serum parathyroid hormone (PTH) 07/19/2017  . Normocytic anemia 07/19/2017  . DOE (dyspnea on exertion) 05/09/2018   Resolved Ambulatory Problems    Diagnosis Date Noted  . Acute renal failure (HCC) 12/19/2016  . Acute kidney injury (HCC) 03/17/2017   Past Medical History:  Diagnosis Date  . Acid reflux   . Anxiety   . High blood pressure       Review of Systems See HPI.     Objective:   Physical Exam  Constitutional: She is oriented to person, place, and time. She appears well-developed and well-nourished.  HENT:  Head: Normocephalic and atraumatic.  Right Ear: External ear normal.  Left  Ear: External ear normal.  Mouth/Throat: Oropharynx is clear and moist. No oropharyngeal exudate.  TM's clear.  Tenderness over maxillary sinuses and nasal bridge.  Nasal turbinates red and swollen.   Eyes: Conjunctivae are normal. Right eye exhibits no discharge. Left eye exhibits no discharge.  Cardiovascular: Normal rate and regular rhythm.  Pulmonary/Chest: Effort normal. She has wheezes.  A few expiratory wheezing. No rhonchi or crackles.   Lymphadenopathy:    She has no cervical adenopathy.  Neurological: She is alert and oriented to person, place, and time.  Psychiatric: She has a normal mood and affect. Her behavior is normal.          Assessment & Plan:  [name] KitchenMarland KitchenDiagnoses and all orders for this visit:  Acute non-recurrent pansinusitis -     predniSONE (DELTASONE) 50 MG tablet; One tab PO daily for 5 days. -     amoxicillin-clavulanate (AUGMENTIN) 875-125 MG tablet; Take 1 tablet by mouth 2 (two) times daily. -     fluticasone (FLONASE) 50 MCG/ACT nasal spray; Place 2 sprays into both nostrils daily.  Centrilobular emphysema (HCC) -     predniSONE (DELTASONE) 50 MG tablet; One tab PO daily for 5 days.  Reassured patient that her symptoms seem to be more allergic/viral.  I do not see any signs of bacterial infection.  With her weeklong certainly this could change.  I did hear some minimal wheezing in the lungs.  I went ahead and treated her with prednisone and Flonase.  If signs of bacterial infection occur or symptoms are persistent with productive cough and fever she may start Augmentin.  Patient requested Augmentin instead of doxycycline.

## 2018-09-12 NOTE — Patient Instructions (Addendum)
ayr nasal gel to moisture nose.  Only use augmentin if symptoms persist.    Sinusitis, Adult Sinusitis is soreness and inflammation of your sinuses. Sinuses are hollow spaces in the bones around your face. They are located:  Around your eyes.  In the middle of your forehead.  Behind your nose.  In your cheekbones.  Your sinuses and nasal passages are lined with a stringy fluid (mucus). Mucus normally drains out of your sinuses. When your nasal tissues get inflamed or swollen, the mucus can get trapped or blocked so air cannot flow through your sinuses. This lets bacteria, viruses, and funguses grow, and that leads to infection. Follow these instructions at home: Medicines  Take, use, or apply over-the-counter and prescription medicines only as told by your doctor. These may include nasal sprays.  If you were prescribed an antibiotic medicine, take it as told by your doctor. Do not stop taking the antibiotic even if you start to feel better. Hydrate and Humidify  Drink enough water to keep your pee (urine) clear or pale yellow.  Use a cool mist humidifier to keep the humidity level in your home above 50%.  Breathe in steam for 10-15 minutes, 3-4 times a day or as told by your doctor. You can do this in the bathroom while a hot shower is running.  Try not to spend time in cool or dry air. Rest  Rest as much as possible.  Sleep with your head raised (elevated).  Make sure to get enough sleep each night. General instructions  Put a warm, moist washcloth on your face 3-4 times a day or as told by your doctor. This will help with discomfort.  Wash your hands often with soap and water. If there is no soap and water, use hand sanitizer.  Do not smoke. Avoid being around people who are smoking (secondhand smoke).  Keep all follow-up visits as told by your doctor. This is important. Contact a doctor if:  You have a fever.  Your symptoms get worse.  Your symptoms do not get  better within 10 days. Get help right away if:  You have a very bad headache.  You cannot stop throwing up (vomiting).  You have pain or swelling around your face or eyes.  You have trouble seeing.  You feel confused.  Your neck is stiff.  You have trouble breathing. This information is not intended to replace advice given to you by your health care provider. Make sure you discuss any questions you have with your health care provider. Document Released: 05/09/2008 Document Revised: 07/17/2016 Document Reviewed: 09/16/2015 Elsevier Interactive Patient Education  [employer].

## 2018-09-14 ENCOUNTER — Telehealth: Payer: Self-pay

## 2018-09-14 ENCOUNTER — Other Ambulatory Visit: Payer: Self-pay | Admitting: Physician Assistant

## 2018-09-14 DIAGNOSIS — E875 Hyperkalemia: Secondary | ICD-10-CM

## 2018-09-14 DIAGNOSIS — R710 Precipitous drop in hematocrit: Secondary | ICD-10-CM

## 2018-09-14 LAB — BASIC METABOLIC PANEL WITH GFR
BUN / CREAT RATIO: 17 (calc) (ref 6–22)
BUN: 19 mg/dL (ref 7–25)
CHLORIDE: 93 mmol/L — AB (ref 98–110)
CO2: 31 mmol/L (ref 20–32)
Calcium: 9.5 mg/dL (ref 8.6–10.4)
Creat: 1.09 mg/dL — ABNORMAL HIGH (ref 0.50–0.99)
GFR, Est African American: 61 mL/min/{1.73_m2} (ref >=60)
GFR, Est Non African American: 52 mL/min/{1.73_m2} — ABNORMAL LOW (ref >=60)
GLUCOSE: 123 mg/dL — AB (ref 65–99)
POTASSIUM: 3.9 mmol/L (ref 3.5–5.3)
SODIUM: 135 mmol/L (ref 135–146)

## 2018-09-14 LAB — HEMOGLOBIN: HEMOGLOBIN: 10.1 g/dL — AB (ref 11.7–15.5)

## 2018-09-14 NOTE — Telephone Encounter (Signed)
Pt advised. Requesting that hemoglobin be checked also, okayed with provider.  Labs ordered and faxed

## 2018-09-14 NOTE — Telephone Encounter (Signed)
[place] for bmp

## 2018-09-14 NOTE — Telephone Encounter (Signed)
Pt calls, wanting to known if lab orders can be entered. Pt states she does not feel very good but does not think it is a sinus infection. Pt described SX as some weakness, some arm tingling, and "just feeling strange". Pt is taking oral prednisone but states she is not taking Augmentin because she does not feel it is needed.   Pt has concerns about sodium and potassium, just wants labs checked to make sure.   OK for orders?

## 2018-09-16 NOTE — Telephone Encounter (Signed)
Call pt: hgb stable and up just a bit. Kidney looks great. Sodium and potassium are normal.

## 2018-09-17 ENCOUNTER — Other Ambulatory Visit: Payer: Self-pay | Admitting: Family Medicine

## 2018-09-17 DIAGNOSIS — J432 Centrilobular emphysema: Secondary | ICD-10-CM

## 2018-09-26 ENCOUNTER — Ambulatory Visit (INDEPENDENT_AMBULATORY_CARE_PROVIDER_SITE_OTHER): Payer: Medicare Other | Admitting: Physician Assistant

## 2018-09-26 ENCOUNTER — Encounter: Payer: Self-pay | Admitting: Physician Assistant

## 2018-09-26 ENCOUNTER — Ambulatory Visit: Payer: Self-pay

## 2018-09-26 VITALS — BP 124/68 | HR 90 | Temp 98.7°F | Ht 63.0 in | Wt 121.0 lb

## 2018-09-26 DIAGNOSIS — N3001 Acute cystitis with hematuria: Secondary | ICD-10-CM

## 2018-09-26 DIAGNOSIS — Z23 Encounter for immunization: Secondary | ICD-10-CM

## 2018-09-26 DIAGNOSIS — R350 Frequency of micturition: Secondary | ICD-10-CM

## 2018-09-26 LAB — POCT URINALYSIS DIPSTICK
Bilirubin, UA: NEGATIVE
GLUCOSE UA: NEGATIVE
Ketones, UA: NEGATIVE
Nitrite, UA: NEGATIVE
PROTEIN UA: NEGATIVE
Spec Grav, UA: 1.01 (ref 1.010–1.025)
Urobilinogen, UA: 0.2 E.U./dL
pH, UA: 6.5 (ref 5.0–8.0)

## 2018-09-26 NOTE — Progress Notes (Signed)
Subjective:    Patient ID: Claire [name], female    DOB: 12/17/1950, 67 y.o.   MRN: [id number]  HPI  Pt is a 67 yo female with CKD 3, COPD, recurrent UTI who presents to the clinic with urinary symptoms. About 2 days ago symptoms started with "felt funny when she urinated". Now she is having increase in urinary frequency with lower abdominal pressure and dysuria. No fever, chills, n/v/d, flank pain. She has not tried anything to make better.   .. Active Ambulatory Problems    Diagnosis Date Noted  . PTSD (post-traumatic stress disorder) 11/10/2011  . Neurosis, anxiety, panic type 11/10/2011  . Acquired hypothyroidism 11/24/2014  . BP (high blood pressure) 07/24/2012  . Hypothyroid   . Chronic use of benzodiazepine for therapeutic purpose 08/11/2015  . Hyponatremia 12/19/2016  . Hyperkalemia 12/19/2016  . Centrilobular emphysema (HCC) 12/20/2016  . Decreased hemoglobin 12/20/2016  . CKD (chronic kidney disease) stage 3, GFR 30-59 ml/min (HCC) 01/16/2017  . Iron deficiency anemia 01/16/2017  . Anemia of chronic disease 05/11/2017  . Low serum vitamin B12 05/14/2017  . Vitamin D deficiency 05/14/2017  . Depressed mood 05/14/2017  . High serum parathyroid hormone (PTH) 07/19/2017  . Normocytic anemia 07/19/2017  . DOE (dyspnea on exertion) 05/09/2018   Resolved Ambulatory Problems    Diagnosis Date Noted  . Acute renal failure (HCC) 12/19/2016  . Acute kidney injury (HCC) 03/17/2017   Past Medical History:  Diagnosis Date  . Acid reflux   . Anxiety   . High blood pressure       Review of Systems    see HPI.  Objective:   Physical Exam  Constitutional: She is oriented to person, place, and time. She appears well-developed and well-nourished.  HENT:  Head: Normocephalic and atraumatic.  Cardiovascular: Normal rate and regular rhythm.  Pulmonary/Chest: Effort normal and breath sounds normal.  No CVA tenderness.   Abdominal: Soft. Bowel sounds are normal. She exhibits  no distension and no mass. There is no tenderness. There is no rebound and no guarding.  Neurological: She is alert and oriented to person, place, and time.  Psychiatric: She has a normal mood and affect. Her behavior is normal.          Assessment & Plan:  [name] KitchenMarland KitchenDiagnoses and all orders for this visit:  Acute cystitis with hematuria -     POCT Urinalysis Dipstick  Urine frequency -     POCT Urinalysis Dipstick -     Urine Culture  Need for immunization against influenza -     Flu Vaccine QUAD 36+ mos IM     .[name] Results for orders placed or performed in visit on 09/26/18  POCT Urinalysis Dipstick  Result Value Ref Range   Color, UA yellow    Clarity, UA clear    Glucose, UA Negative Negative   Bilirubin, UA negative    Ketones, UA negative    Spec Grav, UA 1.010 1.010 - 1.025   Blood, UA trace-lysed    pH, UA 6.5 5.0 - 8.0   Protein, UA Negative Negative   Urobilinogen, UA 0.2 0.2 or 1.0 E.U./dL   Nitrite, UA negative    Leukocytes, UA Small (1+) (A) Negative   Appearance     Odor     Will culture to confirm infection. Pt has augmentin at home from last office visit where she did not need it. Start that and based on old sensitivities should treat. HO given for symptomatic care.  Follow up as needed.   Flu shot given today. She was very concerned about reaction. Waited 15 minutes in office without any reaction. Tolerated well.

## 2018-09-26 NOTE — Patient Instructions (Signed)
Start augmentin.     Urinary Tract Infection, Adult A urinary tract infection (UTI) is an infection of any part of the urinary tract. The urinary tract includes the:  Kidneys.  Ureters.  Bladder.  Urethra.  These organs make, store, and get rid of pee (urine) in the body. Follow these instructions at home:  Take over-the-counter and prescription medicines only as told by your doctor.  If you were prescribed an antibiotic medicine, take it as told by your doctor. Do not stop taking the antibiotic even if you start to feel better.  Avoid the following drinks: ? Alcohol. ? Caffeine. ? Tea. ? Carbonated drinks.  Drink enough fluid to keep your pee clear or pale yellow.  Keep all follow-up visits as told by your doctor. This is important.  Make sure to: ? Empty your bladder often and completely. Do not to hold pee for long periods of time. ? Empty your bladder before and after sex. ? Wipe from front to back after a bowel movement if you are female. Use each tissue one time when you wipe. Contact a doctor if:  You have back pain.  You have a fever.  You feel sick to your stomach (nauseous).  You throw up (vomit).  Your symptoms do not get better after 3 days.  Your symptoms go away and then come back. Get help right away if:  You have very bad back pain.  You have very bad lower belly (abdominal) pain.  You are throwing up and cannot keep down any medicines or water. This information is not intended to replace advice given to you by your health care provider. Make sure you discuss any questions you have with your health care provider. Document Released: 05/09/2008 Document Revised: 04/28/2016 Document Reviewed: 10/12/2015 Elsevier Interactive Patient Education  [employer].

## 2018-09-27 ENCOUNTER — Other Ambulatory Visit: Payer: Self-pay

## 2018-09-27 ENCOUNTER — Encounter (HOSPITAL_COMMUNITY): Payer: Self-pay | Admitting: Psychiatry

## 2018-09-27 ENCOUNTER — Ambulatory Visit (INDEPENDENT_AMBULATORY_CARE_PROVIDER_SITE_OTHER): Payer: Medicare Other | Admitting: Psychiatry

## 2018-09-27 VITALS — BP 122/78 | HR 105 | Ht 63.0 in | Wt 121.0 lb

## 2018-09-27 DIAGNOSIS — F410 Panic disorder [episodic paroxysmal anxiety] without agoraphobia: Secondary | ICD-10-CM

## 2018-09-27 DIAGNOSIS — F411 Generalized anxiety disorder: Secondary | ICD-10-CM | POA: Diagnosis not present

## 2018-09-27 DIAGNOSIS — F4310 Post-traumatic stress disorder, unspecified: Secondary | ICD-10-CM | POA: Diagnosis not present

## 2018-09-27 MED ORDER — CLONAZEPAM 0.5 MG PO TABS: ORAL_TABLET | ORAL | 2 refills | Status: DC

## 2018-09-27 NOTE — Progress Notes (Signed)
Patient ID: Claire [name], female   DOB: 07-15-1951, 67 y.o.   MRN: [id number]  [facility] Follow up visit  SHERRILYN NAIRN [id number] 67 y.o.  09/27/2018 3:37 PM  Chief Complaint:  Establish care for panic attacks  History of Present Illness:   Patient Presents for follow up and medication management for  panic symptoms. Diagnosed with PTSD, panic symptoms and GAD.  Conflicts with daughter has been stressful. Patient has moved to her own apartment. Feels better Daughter would curse her in pastl   klonopine helps anxiety   Understand side effects of benzo and effect on memory  Modifying factor: breahting techniques.grandkids   PTSD is related to her past some flashbacks. Distraction helps Depression is not worse.  No side effects     Aggravating factors:husband illness. Living with daughter   No psychosis  Medical History; Past Medical History:  Diagnosis Date  . Acid reflux   . Anxiety   . High blood pressure   . High serum parathyroid hormone (PTH) 07/19/2017   96 06/28/2014, 162 12/29/16  . Hypothyroid   . PTSD (post-traumatic stress disorder)     Allergies: Allergies  Allergen Reactions  . Macrodantin [Nitrofurantoin Macrocrystal] Rash  . Bactrim [Sulfamethoxazole-Trimethoprim]     ACUTE RENAL fAILURE/HYPONATREMIA/HYPERKALEMIA  . Zithromax [Azithromycin]     Medications: Outpatient Encounter Medications as of 09/27/2018  Medication Sig  . albuterol (PROAIR HFA) 108 (90 Base) MCG/ACT inhaler Inhale 2 puffs into the lungs every 6 (six) hours as needed for wheezing or shortness of breath.  [name] albuterol (PROVENTIL) (2.5 MG/3ML) 0.083% nebulizer solution Take 3 mLs (2.5 mg total) by nebulization every 4 (four) hours as needed for wheezing or shortness of breath.  . AMBULATORY NON FORMULARY MEDICATION Continuous stationary and portable oxygen tanks for use with ambulation. DX: COPD with hypoxia.  . AMBULATORY NON FORMULARY MEDICATION  Home concentrator and one Imogen G3 portable oxygen concentrator for hypoxia.  . AMBULATORY NON FORMULARY MEDICATION Portable oxygen compressor device for portable O2 administration continuous 2L/min while walking. Dx COPD w/ hypoxia. Titrate for OCD (oxygen conserving device).  [name] atenolol (TENORMIN) 25 MG tablet Take 1 tablet (25 mg total) by mouth daily.  . Calcium Carbonate-Vitamin D (CALCIUM-VITAMIN D) 500-200 MG-UNIT per tablet Take 1 tablet by mouth daily.  . clonazePAM (KLONOPIN) 0.5 MG tablet TAKE 1 TABLET BY MOUTH 3 TIMES A DAY  . DULERA 100-5 MCG/ACT AERO 2 PUFFS TWICE A DAY  . ferrous sulfate 325 (65 FE) MG EC tablet 1 tab PO TID every other day  . fluticasone (FLONASE) 50 MCG/ACT nasal spray Place 2 sprays into both nostrils daily.  [name] levothyroxine (SYNTHROID, LEVOTHROID) 75 MCG tablet TAKE 1 TABLET BY MOUTH EVERY DAY  . SPIRIVA RESPIMAT 2.5 MCG/ACT AERS TAKE 2 PUFFS ONCE A DAY.  . [DISCONTINUED] clonazePAM (KLONOPIN) 0.5 MG tablet TAKE 1 TABLET BY MOUTH 3 TIMES A DAY  Fill when due after checking last refill  . Cobalamine Combinations (B-12) [phone] MCG SUBL Place under the tongue.  . Cyanocobalamin (B-12) 500 MCG SUBL Place 1,000 mcg under the tongue daily. (Patient not taking: Reported on 09/27/2018)  . MAGNESIUM PO Take by mouth.  . ranitidine (ZANTAC) 150 MG tablet Take 1 tablet (150 mg total) by mouth 2 (two) times daily. (Patient not taking: Reported on 09/27/2018)   No facility-administered encounter medications on file as of 09/27/2018.     Family History; Family History  Problem Relation Age of Onset  . Anxiety disorder  Mother   . OCD Daughter   . Alcohol abuse Father   . Anxiety disorder Sister        Labs:  Recent Results (from the past 2160 hour(s))  TSH     Status: Abnormal   Collection Time: 07/04/18  3:24 PM  Result Value Ref Range   TSH 5.71 (H) 0.40 - 4.50 mIU/L  CBC with Differential/Platelet     Status: Abnormal   Collection Time: 07/04/18   3:24 PM  Result Value Ref Range   WBC 6.8 3.8 - 10.8 Thousand/uL   RBC 2.97 (L) 3.80 - 5.10 Million/uL   Hemoglobin 10.0 (L) 11.7 - 15.5 g/dL   HCT 40.9 (L) 81.1 - 91.4 %   MCV 94.3 80.0 - 100.0 fL   MCH 33.7 (H) 27.0 - 33.0 pg   MCHC 35.7 32.0 - 36.0 g/dL   RDW 78.2 95.6 - 21.3 %   Platelets 290 140 - 400 Thousand/uL   MPV 9.2 7.5 - 12.5 fL   Neutro Abs 4,168 1,500 - 7,800 cells/uL   Lymphs Abs 1,843 850 - 3,900 cells/uL   WBC mixed population 632 200 - 950 cells/uL   Eosinophils Absolute 109 15 - 500 cells/uL   Basophils Absolute 48 0 - 200 cells/uL   Neutrophils Relative % 61.3 %   Total Lymphocyte 27.1 %   Monocytes Relative 9.3 %   Eosinophils Relative 1.6 %   Basophils Relative 0.7 %  COMPLETE METABOLIC PANEL WITH GFR     Status: Abnormal   Collection Time: 07/04/18  3:24 PM  Result Value Ref Range   Glucose, Bld 95 65 - 99 mg/dL    Comment: .            Fasting reference interval .    BUN 14 7 - 25 mg/dL   Creat 0.86 (H) 5.78 - 0.99 mg/dL    Comment: For patients >10 years of age, the reference limit for Creatinine is approximately 13% higher for people identified as African-American. .    GFR, Est Non African American 50 (L) > OR = 60 mL/min/1.26m2   GFR, Est African American 58 (L) > OR = 60 mL/min/1.84m2   BUN/Creatinine Ratio 12 6 - 22 (calc)   Sodium 131 (L) 135 - 146 mmol/L   Potassium 4.5 3.5 - 5.3 mmol/L   Chloride 93 (L) 98 - 110 mmol/L   CO2 32 20 - 32 mmol/L   Calcium 9.6 8.6 - 10.4 mg/dL   Total Protein 7.4 6.1 - 8.1 g/dL   Albumin 4.5 3.6 - 5.1 g/dL   Globulin 2.9 1.9 - 3.7 g/dL (calc)   AG Ratio 1.6 1.0 - 2.5 (calc)   Total Bilirubin 0.5 0.2 - 1.2 mg/dL   Alkaline phosphatase (APISO) 72 33 - 130 U/L   AST 16 10 - 35 U/L   ALT 7 6 - 29 U/L  COMPLETE METABOLIC PANEL WITH GFR     Status: Abnormal   Collection Time: 07/30/18  2:08 PM  Result Value Ref Range   Glucose, Bld 101 (H) 65 - 99 mg/dL    Comment: .            Fasting reference  interval . For someone without known diabetes, a glucose value between 100 and 125 mg/dL is consistent with prediabetes and should be confirmed with a follow-up test. .    BUN 10 7 - 25 mg/dL   Creat 4.69 (H) 6.29 - 0.99 mg/dL    Comment: For  patients >13 years of age, the reference limit for Creatinine is approximately 13% higher for people identified as African-American. .    GFR, Est Non African American 52 (L) > OR = 60 mL/min/1.69m2   GFR, Est African American 61 > OR = 60 mL/min/1.67m2   BUN/Creatinine Ratio 9 6 - 22 (calc)   Sodium 136 135 - 146 mmol/L   Potassium 4.1 3.5 - 5.3 mmol/L   Chloride 97 (L) 98 - 110 mmol/L   CO2 35 (H) 20 - 32 mmol/L   Calcium 9.6 8.6 - 10.4 mg/dL   Total Protein 7.4 6.1 - 8.1 g/dL   Albumin 4.3 3.6 - 5.1 g/dL   Globulin 3.1 1.9 - 3.7 g/dL (calc)   AG Ratio 1.4 1.0 - 2.5 (calc)   Total Bilirubin 0.4 0.2 - 1.2 mg/dL   Alkaline phosphatase (APISO) 76 33 - 130 U/L   AST 13 10 - 35 U/L   ALT 5 (L) 6 - 29 U/L  TSH     Status: None   Collection Time: 07/30/18  2:08 PM  Result Value Ref Range   TSH 2.15 0.40 - 4.50 mIU/L  Hemoglobin     Status: Abnormal   Collection Time: 09/14/18 11:01 AM  Result Value Ref Range   Hemoglobin 10.1 (L) 11.7 - 15.5 g/dL  BASIC METABOLIC PANEL WITH GFR     Status: Abnormal   Collection Time: 09/14/18 11:01 AM  Result Value Ref Range   Glucose, Bld 123 (H) 65 - 99 mg/dL    Comment: .            Fasting reference interval . For someone without known diabetes, a glucose value between 100 and 125 mg/dL is consistent with prediabetes and should be confirmed with a follow-up test. .    BUN 19 7 - 25 mg/dL   Creat 1.30 (H) 8.65 - 0.99 mg/dL    Comment: For patients >65 years of age, the reference limit for Creatinine is approximately 13% higher for people identified as African-American. .    GFR, Est Non African American 52 (L) > OR = 60 mL/min/1.79m2   GFR, Est African American 61 > OR = 60 mL/min/1.43m2    BUN/Creatinine Ratio 17 6 - 22 (calc)   Sodium 135 135 - 146 mmol/L   Potassium 3.9 3.5 - 5.3 mmol/L   Chloride 93 (L) 98 - 110 mmol/L   CO2 31 20 - 32 mmol/L   Calcium 9.5 8.6 - 10.4 mg/dL  POCT Urinalysis Dipstick     Status: Abnormal   Collection Time: 09/26/18  7:57 AM  Result Value Ref Range   Color, UA yellow    Clarity, UA clear    Glucose, UA Negative Negative   Bilirubin, UA negative    Ketones, UA negative    Spec Grav, UA 1.010 1.010 - 1.025   Blood, UA trace-lysed    pH, UA 6.5 5.0 - 8.0   Protein, UA Negative Negative   Urobilinogen, UA 0.2 0.2 or 1.0 E.U./dL   Nitrite, UA negative    Leukocytes, UA Small (1+) (A) Negative   Appearance     Odor        Mental Status Examination;   Psychiatric Specialty Exam: Physical Exam  Constitutional: She appears well-nourished.  Skin: She is not diaphoretic.    Review of Systems  Constitutional: Negative for fever.  Cardiovascular: Negative for chest pain and palpitations.  Gastrointestinal: Negative for nausea.  Skin: Negative for rash.  Neurological:  Negative for tremors and headaches.  Psychiatric/Behavioral: Negative for suicidal ideas.    Blood pressure 122/78, pulse (!) 105, height 5\' 3"  (1.6 m), weight 121 lb (54.9 kg).Body mass index is 21.43 kg/m.  General Appearance: Casual  Eye Contact::  Fair  Speech:  Slow  Volume:  Decreased  Mood: better  Affect:  congruent  Thought Process:  Coherent and intact  Orientation:  Full (Time, Place, and Person)  Thought Content:  Rumination  Suicidal Thoughts:  No  Homicidal Thoughts:  No  Memory:  Immediate;   Fair Recent;   Fair  Judgement:  Fair  Insight:  Shallow  Psychomotor Activity:  Normal  Concentration:  Fair  Recall:  Fair  Akathisia:  Negative  Handed:  Right  AIMS (if indicated):     Assets:  Social Support Vocational/Educational  Sleep:        Assessment: Axis I: Panic disorder. Possible PTSD. Rule out generalized anxiety disorder.  Nicotine dependence  Axis II: Deferred  Axis III:  Past Medical History:  Diagnosis Date  . Acid reflux   . Anxiety   . High blood pressure   . High serum parathyroid hormone (PTH) 07/19/2017   96 06/28/2014, 162 12/29/16  . Hypothyroid   . PTSD (post-traumatic stress disorder)     Axis IV: Psychosocial. History of trauma   Treatment Plan and Summary:  Anxiety and GAD: fluctuates but better since moved . Continue klonopine.   PTSD: baseline. continuie disraction techniques Does not want to be on sSRi  But just klonopine   Panic attacks:infequent Continue klonopine. Cautioned about side effects and tolerance FU 2-3 months  Thresa Ross, MD 09/27/2018

## 2018-09-28 LAB — URINE CULTURE
MICRO NUMBER:: 91274934
SPECIMEN QUALITY:: ADEQUATE

## 2018-09-28 NOTE — Progress Notes (Signed)
Call pt: strep agalactiae detected. Augmentin should treat.

## 2018-10-03 ENCOUNTER — Other Ambulatory Visit: Payer: Self-pay | Admitting: Physician Assistant

## 2018-10-08 ENCOUNTER — Telehealth: Payer: Self-pay | Admitting: Physician Assistant

## 2018-10-08 NOTE — Telephone Encounter (Signed)
Pt wants to know when is she due for a pneumonia vaccine?

## 2018-10-08 NOTE — Telephone Encounter (Signed)
Do not see any pneumonia on file ... Routing.

## 2018-10-09 NOTE — Telephone Encounter (Signed)
Thank you :)

## 2018-10-09 NOTE — Telephone Encounter (Signed)
Yes she is due. She has declined it every time we have talked about it. I would love for her to get it. pneumococcal 23.

## 2018-10-09 NOTE — Telephone Encounter (Signed)
Left VM with recommendation. Callback provided for scheduling.  

## 2018-10-16 ENCOUNTER — Ambulatory Visit: Payer: Self-pay

## 2018-10-17 ENCOUNTER — Ambulatory Visit: Payer: Self-pay

## 2018-10-18 ENCOUNTER — Ambulatory Visit: Payer: Self-pay

## 2018-10-19 ENCOUNTER — Other Ambulatory Visit: Payer: Self-pay | Admitting: Physician Assistant

## 2018-10-21 ENCOUNTER — Other Ambulatory Visit: Payer: Self-pay | Admitting: Physician Assistant

## 2018-10-21 DIAGNOSIS — J432 Centrilobular emphysema: Secondary | ICD-10-CM

## 2018-10-22 ENCOUNTER — Ambulatory Visit: Payer: Medicare Other

## 2018-10-24 ENCOUNTER — Ambulatory Visit: Payer: Medicare Other

## 2018-10-25 ENCOUNTER — Telehealth: Payer: Self-pay

## 2018-10-25 MED ORDER — ALBUTEROL SULFATE (2.5 MG/3ML) 0.083% IN NEBU: 2.5000 mg | INHALATION_SOLUTION | RESPIRATORY_TRACT | 6 refills | Status: DC | PRN

## 2018-10-25 NOTE — Telephone Encounter (Signed)
Claire [name]'s pharmacy sent a script for a new prescription needed for her albuterol solution. Just wanted to let you know. Thanks!

## 2018-10-25 NOTE — Telephone Encounter (Signed)
Ok I sent another refill. Let me know if need anything else.

## 2018-10-25 NOTE — Addendum Note (Signed)
Addended by: Jomarie LongsBREEBACK, Mirela Parsley L on: 10/25/2018 09:13 PM   Modules accepted: Orders

## 2018-10-30 ENCOUNTER — Ambulatory Visit: Payer: Self-pay

## 2018-11-05 ENCOUNTER — Ambulatory Visit (INDEPENDENT_AMBULATORY_CARE_PROVIDER_SITE_OTHER): Payer: Medicare Other | Admitting: Physician Assistant

## 2018-11-05 VITALS — BP 125/67 | HR 88 | Temp 97.9°F

## 2018-11-05 DIAGNOSIS — Z23 Encounter for immunization: Secondary | ICD-10-CM

## 2018-11-05 NOTE — Progress Notes (Signed)
Pt in today for pneumococcal 23.Injection was given in left deltoid. Pt waited 15 minutes after injection, no reaction noted.   Agree with above plan. Tandy GawJade Breeback PA-C

## 2018-11-16 ENCOUNTER — Other Ambulatory Visit: Payer: Self-pay | Admitting: Physician Assistant

## 2018-11-16 DIAGNOSIS — J432 Centrilobular emphysema: Secondary | ICD-10-CM

## 2018-11-21 ENCOUNTER — Ambulatory Visit (INDEPENDENT_AMBULATORY_CARE_PROVIDER_SITE_OTHER): Payer: Medicare Other | Admitting: Physician Assistant

## 2018-11-21 ENCOUNTER — Encounter: Payer: Self-pay | Admitting: Physician Assistant

## 2018-11-21 VITALS — BP 125/80 | HR 100 | Temp 97.4°F | Wt 117.0 lb

## 2018-11-21 DIAGNOSIS — R350 Frequency of micturition: Secondary | ICD-10-CM

## 2018-11-21 DIAGNOSIS — R0982 Postnasal drip: Secondary | ICD-10-CM | POA: Diagnosis not present

## 2018-11-21 DIAGNOSIS — N3001 Acute cystitis with hematuria: Secondary | ICD-10-CM | POA: Diagnosis not present

## 2018-11-21 LAB — POCT URINALYSIS DIPSTICK
Bilirubin, UA: NEGATIVE
Glucose, UA: NEGATIVE
Ketones, UA: NEGATIVE
NITRITE UA: NEGATIVE
PH UA: 5.5 (ref 5.0–8.0)
Protein, UA: NEGATIVE
SPEC GRAV UA: 1.01 (ref 1.010–1.025)
UROBILINOGEN UA: 0.2 U/dL

## 2018-11-21 MED ORDER — AMOXICILLIN-POT CLAVULANATE 875-125 MG PO TABS: 1.0000 | ORAL_TABLET | Freq: Two times a day (BID) | ORAL | 0 refills | Status: DC

## 2018-11-21 NOTE — Patient Instructions (Signed)

## 2018-11-21 NOTE — Progress Notes (Signed)
Subjective:    Patient ID: Claire [name], female    DOB: 10-25-1951, 67 y.o.   MRN: [id number]  HPI  Patient is a 67 year old female with history of recurrent urinary tract infections and CKD 3 who presents to the clinic with 3 days of urinary pressure and frequent urination.  She denies any fever, chills, abdominal pain, flank pain.  She denies any recent episodes of diarrhea.  She has increased her water intake and drinking cranberry juice.  Her last UTI was 10/23.  She does mention postnasal drip that has been a recurrent problem for years.  She is wondering if there is anything we could do about it.  She occasionally takes a Claritin.  She has not historically like nasal sprays.  .. Active Ambulatory Problems    Diagnosis Date Noted  . PTSD (post-traumatic stress disorder) 11/10/2011  . Neurosis, anxiety, panic type 11/10/2011  . Acquired hypothyroidism 11/24/2014  . BP (high blood pressure) 07/24/2012  . Hypothyroid   . Chronic use of benzodiazepine for therapeutic purpose 08/11/2015  . Hyponatremia 12/19/2016  . Hyperkalemia 12/19/2016  . Centrilobular emphysema (HCC) 12/20/2016  . Decreased hemoglobin 12/20/2016  . CKD (chronic kidney disease) stage 3, GFR 30-59 ml/min (HCC) 01/16/2017  . Iron deficiency anemia 01/16/2017  . Anemia of chronic disease 05/11/2017  . Low serum vitamin B12 05/14/2017  . Vitamin D deficiency 05/14/2017  . Depressed mood 05/14/2017  . High serum parathyroid hormone (PTH) 07/19/2017  . Normocytic anemia 07/19/2017  . DOE (dyspnea on exertion) 05/09/2018   Resolved Ambulatory Problems    Diagnosis Date Noted  . Acute renal failure (HCC) 12/19/2016  . Acute kidney injury (HCC) 03/17/2017   Past Medical History:  Diagnosis Date  . Acid reflux   . Anxiety   . High blood pressure      Review of Systems See HPI.     Objective:   Physical Exam Constitutional:      Appearance: Normal appearance.  HENT:     Head: Normocephalic and  atraumatic.     Right Ear: Tympanic membrane, ear canal and external ear normal.     Left Ear: Tympanic membrane, ear canal and external ear normal.     Nose: Congestion and rhinorrhea present.     Mouth/Throat:     Mouth: Mucous membranes are moist.  Eyes:     Conjunctiva/sclera: Conjunctivae normal.  Cardiovascular:     Rate and Rhythm: Normal rate and regular rhythm.  Pulmonary:     Effort: Pulmonary effort is normal.     Breath sounds: Normal breath sounds.     Comments: No CVA tenderness.  Abdominal:     General: Bowel sounds are normal.     Tenderness: There is no abdominal tenderness.  Skin:    General: Skin is warm.  Neurological:     General: No focal deficit present.     Mental Status: She is alert and oriented to person, place, and time.  Psychiatric:        Mood and Affect: Mood normal.        Behavior: Behavior normal.           Assessment & Plan:  [name].[name].Rinaldo Cloudamela was seen today for urinary frequency.  Diagnoses and all orders for this visit:  Acute cystitis with hematuria -     amoxicillin-clavulanate (AUGMENTIN) 875-125 MG tablet; Take 1 tablet by mouth 2 (two) times daily. For 7 days.  Urinary frequency -     POCT Urinalysis Dipstick -  Urine Culture  PND (post-nasal drip) -     azelastine (ASTELIN) 0.1 % nasal spray; Place 1 spray into both nostrils 2 (two) times daily. Use in each nostril as directed   .[name] Results for orders placed or performed in visit on 11/21/18  POCT Urinalysis Dipstick  Result Value Ref Range   Color, UA Yellow    Clarity, UA Clear    Glucose, UA Negative Negative   Bilirubin, UA Negative    Ketones, UA Negative    Spec Grav, UA 1.010 1.010 - 1.025   Blood, UA Trace- Lysed    pH, UA 5.5 5.0 - 8.0   Protein, UA Negative Negative   Urobilinogen, UA 0.2 0.2 or 1.0 E.U./dL   Nitrite, UA Negative    Leukocytes, UA Moderate (2+) (A) Negative   Appearance     Odor     Will cutlure. Treating for UTI. Discussed symptomatic  care. Stay hydrated.   Try astelin and claritin daily to see if would help with some PND. Discussed how to properly use nasal sprays.

## 2018-11-22 ENCOUNTER — Encounter: Payer: Self-pay | Admitting: Physician Assistant

## 2018-11-22 MED ORDER — AZELASTINE HCL 0.1 % NA SOLN: 1.0000 | Freq: Two times a day (BID) | NASAL | 2 refills | Status: DC

## 2018-11-23 ENCOUNTER — Other Ambulatory Visit: Payer: Self-pay

## 2018-11-23 LAB — URINE CULTURE
MICRO NUMBER:: 91515178
SPECIMEN QUALITY: ADEQUATE

## 2018-11-23 MED ORDER — RANITIDINE HCL 150 MG PO TABS: 150.0000 mg | ORAL_TABLET | Freq: Two times a day (BID) | ORAL | 3 refills | Status: DC

## 2018-11-23 NOTE — Progress Notes (Signed)
E.coli found. augmentin should work.

## 2018-12-07 NOTE — Telephone Encounter (Signed)
No action needed at this time.

## 2018-12-12 ENCOUNTER — Other Ambulatory Visit: Payer: Self-pay | Admitting: Physician Assistant

## 2018-12-14 ENCOUNTER — Other Ambulatory Visit: Payer: Self-pay | Admitting: Physician Assistant

## 2018-12-14 DIAGNOSIS — J432 Centrilobular emphysema: Secondary | ICD-10-CM

## 2018-12-19 ENCOUNTER — Ambulatory Visit: Payer: Self-pay | Admitting: Physician Assistant

## 2018-12-21 ENCOUNTER — Encounter: Payer: Self-pay | Admitting: Physician Assistant

## 2018-12-21 ENCOUNTER — Ambulatory Visit (INDEPENDENT_AMBULATORY_CARE_PROVIDER_SITE_OTHER): Payer: Medicare Other | Admitting: Physician Assistant

## 2018-12-21 VITALS — BP 103/70 | HR 85 | Temp 98.4°F | Ht 63.0 in | Wt 125.0 lb

## 2018-12-21 DIAGNOSIS — R350 Frequency of micturition: Secondary | ICD-10-CM | POA: Diagnosis not present

## 2018-12-21 DIAGNOSIS — R05 Cough: Secondary | ICD-10-CM

## 2018-12-21 DIAGNOSIS — J432 Centrilobular emphysema: Secondary | ICD-10-CM | POA: Diagnosis not present

## 2018-12-21 DIAGNOSIS — Z87440 Personal history of urinary (tract) infections: Secondary | ICD-10-CM | POA: Diagnosis not present

## 2018-12-21 DIAGNOSIS — N183 Chronic kidney disease, stage 3 unspecified: Secondary | ICD-10-CM

## 2018-12-21 DIAGNOSIS — R059 Cough, unspecified: Secondary | ICD-10-CM

## 2018-12-21 LAB — POCT URINALYSIS DIPSTICK
Bilirubin, UA: NEGATIVE
Blood, UA: NEGATIVE
Glucose, UA: NEGATIVE
KETONES UA: NEGATIVE
Nitrite, UA: NEGATIVE
Protein, UA: NEGATIVE
Spec Grav, UA: <=1.005 — AB (ref 1.010–1.025)
Urobilinogen, UA: 0.2 E.U./dL
pH, UA: 5.5 (ref 5.0–8.0)

## 2018-12-21 MED ORDER — PREDNISONE 50 MG PO TABS: ORAL_TABLET | ORAL | 0 refills | Status: DC

## 2018-12-21 MED ORDER — AMOXICILLIN-POT CLAVULANATE 875-125 MG PO TABS: 1.0000 | ORAL_TABLET | Freq: Two times a day (BID) | ORAL | 0 refills | Status: DC

## 2018-12-21 NOTE — Progress Notes (Signed)
Subjective:    Patient ID: Claire [name], female    DOB: [birth year], 68 y.o.   MRN: [id number]  HPI  Pt is a 68 yo female with hx of UTI's and COPD who presents to the clinic to discuss increase in urinary frequency for the last few days. She denies any dysuria. No fever, chills, body aches, flank pain or abdominal pain. Her last UTI was 12/18 and before that 10/23.   She feels like she has been coughing a lot more over the past 2-3 weeks. Cough is productive. Her chest feels a little tight. She wonders about the zephyra procedure for COPD and if I recommended it.   Pt has CKD and request a kidney check.   .. Active Ambulatory Problems    Diagnosis Date Noted  . PTSD (post-traumatic stress disorder) 11/10/2011  . Neurosis, anxiety, panic type 11/10/2011  . Acquired hypothyroidism 11/24/2014  . BP (high blood pressure) 07/24/2012  . Hypothyroid   . Chronic use of benzodiazepine for therapeutic purpose 08/11/2015  . Hyponatremia 12/19/2016  . Hyperkalemia 12/19/2016  . Centrilobular emphysema (HCC) 12/20/2016  . Decreased hemoglobin 12/20/2016  . CKD (chronic kidney disease) stage 3, GFR 30-59 ml/min (HCC) 01/16/2017  . Iron deficiency anemia 01/16/2017  . Anemia of chronic disease 05/11/2017  . Low serum vitamin B12 05/14/2017  . Vitamin D deficiency 05/14/2017  . Depressed mood 05/14/2017  . High serum parathyroid hormone (PTH) 07/19/2017  . Normocytic anemia 07/19/2017  . DOE (dyspnea on exertion) 05/09/2018   Resolved Ambulatory Problems    Diagnosis Date Noted  . Acute renal failure (HCC) 12/19/2016  . Acute kidney injury (HCC) 03/17/2017   Past Medical History:  Diagnosis Date  . Acid reflux   . Anxiety   . High blood pressure           Review of Systems  All other systems reviewed and are negative.      Objective:   Physical Exam Vitals signs reviewed.  Constitutional:      Appearance: Normal appearance.  HENT:     Head: Normocephalic and  atraumatic.     Nose: Nose normal.     Mouth/Throat:     Mouth: Mucous membranes are moist.  Eyes:     Conjunctiva/sclera: Conjunctivae normal.  Cardiovascular:     Rate and Rhythm: Normal rate and regular rhythm.  Pulmonary:     Effort: Pulmonary effort is normal.     Breath sounds: Normal breath sounds. No wheezing or rhonchi.  Abdominal:     Tenderness: There is no abdominal tenderness. There is no right CVA tenderness, left CVA tenderness or guarding.  Neurological:     General: No focal deficit present.     Mental Status: She is alert and oriented to person, place, and time.           Assessment & Plan:  [name].[name].Rinaldo Cloudamela was seen today for urinary tract infection and uri.  Diagnoses and all orders for this visit:  Urinary frequency -     POCT Urinalysis Dipstick -     Urine Culture  History of recurrent UTI (urinary tract infection)  Centrilobular emphysema (HCC)  Cough  CKD (chronic kidney disease) stage 3, GFR 30-59 ml/min (HCC) -     BASIC METABOLIC PANEL WITH GFR  Other orders -     predniSONE (DELTASONE) 50 MG tablet; Take one tablet for 5 days. -     amoxicillin-clavulanate (AUGMENTIN) 875-125 MG tablet; Take 1 tablet by mouth 2 (  two) times daily.     [name] Results for orders placed or performed in visit on 12/21/18  POCT Urinalysis Dipstick  Result Value Ref Range   Color, UA yellow    Clarity, UA clear    Glucose, UA Negative Negative   Bilirubin, UA negative    Ketones, UA negative    Spec Grav, UA <=1.005 (A) 1.010 - 1.025   Blood, UA negative    pH, UA 5.5 5.0 - 8.0   Protein, UA Negative Negative   Urobilinogen, UA 0.2 0.2 or 1.0 E.U./dL   Nitrite, UA negative    Leukocytes, UA Trace (A) Negative   Appearance     Odor     Will culture. Dipstick positive for trace leukocytes. If positive will consider preventative abx treatment for recurrent UTI's. Its the weekend so I sent rx for augmentin with patient is symptoms worsened.   bMP ordered.    Discussed cough. Lungs sound good. Prednisone burst given.

## 2018-12-22 LAB — URINE CULTURE
MICRO NUMBER:: 71390
Result:: NO GROWTH
SPECIMEN QUALITY:: ADEQUATE

## 2018-12-23 NOTE — Progress Notes (Signed)
No bacteria. Does NOT need to start antibiotic.

## 2018-12-24 ENCOUNTER — Other Ambulatory Visit: Payer: Self-pay | Admitting: Physician Assistant

## 2018-12-27 ENCOUNTER — Ambulatory Visit (HOSPITAL_COMMUNITY): Payer: Medicare Other | Admitting: Psychiatry

## 2018-12-27 ENCOUNTER — Other Ambulatory Visit (HOSPITAL_COMMUNITY): Payer: Self-pay

## 2018-12-27 MED ORDER — CLONAZEPAM 0.5 MG PO TABS: ORAL_TABLET | ORAL | 0 refills | Status: DC

## 2018-12-27 NOTE — Progress Notes (Signed)
Called in a 7 day supply of Clonazepam 0.5mg  to last patient until next appt. Nothing further is needed at this time.

## 2019-01-02 ENCOUNTER — Other Ambulatory Visit: Payer: Self-pay

## 2019-01-02 ENCOUNTER — Encounter (HOSPITAL_COMMUNITY): Payer: Self-pay | Admitting: Psychiatry

## 2019-01-02 ENCOUNTER — Ambulatory Visit (INDEPENDENT_AMBULATORY_CARE_PROVIDER_SITE_OTHER): Payer: Medicare Other | Admitting: Psychiatry

## 2019-01-02 VITALS — BP 110/68 | HR 89 | Ht 63.0 in | Wt 123.0 lb

## 2019-01-02 DIAGNOSIS — F4310 Post-traumatic stress disorder, unspecified: Secondary | ICD-10-CM

## 2019-01-02 DIAGNOSIS — F410 Panic disorder [episodic paroxysmal anxiety] without agoraphobia: Secondary | ICD-10-CM

## 2019-01-02 DIAGNOSIS — F411 Generalized anxiety disorder: Secondary | ICD-10-CM

## 2019-01-02 DIAGNOSIS — I10 Essential (primary) hypertension: Secondary | ICD-10-CM

## 2019-01-02 LAB — BASIC METABOLIC PANEL WITH GFR
BUN/Creatinine Ratio: 9 (calc) (ref 6–22)
BUN: 10 mg/dL (ref 7–25)
CO2: 32 mmol/L (ref 20–32)
Calcium: 9.1 mg/dL (ref 8.6–10.4)
Chloride: 94 mmol/L — ABNORMAL LOW (ref 98–110)
Creat: 1.07 mg/dL — ABNORMAL HIGH (ref 0.50–0.99)
GFR, EST AFRICAN AMERICAN: 62 mL/min/{1.73_m2} (ref >=60)
GFR, EST NON AFRICAN AMERICAN: 54 mL/min/{1.73_m2} — AB (ref >=60)
Glucose, Bld: 82 mg/dL (ref 65–99)
Potassium: 4.1 mmol/L (ref 3.5–5.3)
Sodium: 136 mmol/L (ref 135–146)

## 2019-01-02 MED ORDER — CLONAZEPAM 0.5 MG PO TABS: ORAL_TABLET | ORAL | 2 refills | Status: DC

## 2019-01-02 MED ORDER — ATENOLOL 25 MG PO TABS: 25.0000 mg | ORAL_TABLET | Freq: Every day | ORAL | 5 refills | Status: DC

## 2019-01-02 NOTE — Progress Notes (Signed)
Call pt: labs look stable and good. Kidney function and GFR have improved.

## 2019-01-02 NOTE — Progress Notes (Signed)
Patient ID: Claire [name], female   DOB: [birth year], 68 y.o.   MRN: [id number]  [facility] Outpatient Follow up visit  Claire [name] [id number] 68 y.o.  01/02/2019 1:11 PM  Chief Complaint:  Establish care for panic attacks  History of Present Illness:   Patient Presents for follow up and medication management for  panic symptoms. Diagnosed with PTSD, panic symptoms and GAD.  Doing fair now living independently not with her daughter who would be very argumentative with her.  She is wearing a mask says that she has to wear that to prevent from infections Continues take Klonopin 3 times a day does not want to cut down says she starts having panic attack test to think about cutting it down and it affects her breathing She understands the risk of forgetfulness with these medications   Modifying factor: breahting techniques. grandkids   PTSD is related to her past some flashbacks. Distraction helps Depression not worse No side effects     Aggravating factors:daughter  No psychosis  Medical History; Past Medical History:  Diagnosis Date  . Acid reflux   . Anxiety   . High blood pressure   . High serum parathyroid hormone (PTH) 07/19/2017   96 06/28/2014, 162 12/29/16  . Hypothyroid   . PTSD (post-traumatic stress disorder)     Allergies: Allergies  Allergen Reactions  . Bactrim [Sulfamethoxazole-Trimethoprim]     ACUTE RENAL fAILURE/HYPONATREMIA/HYPERKALEMIA  . Macrodantin [Nitrofurantoin Macrocrystal] Rash  . Zithromax [Azithromycin]     Medications: Outpatient Encounter Medications as of 01/02/2019  Medication Sig  . albuterol (PROVENTIL) (2.5 MG/3ML) 0.083% nebulizer solution Take 3 mLs (2.5 mg total) by nebulization every 4 (four) hours as needed for wheezing or shortness of breath.  . AMBULATORY NON FORMULARY MEDICATION Continuous stationary and portable oxygen tanks for use with ambulation. DX: COPD with hypoxia.  . AMBULATORY NON FORMULARY  MEDICATION Home concentrator and one Imogen G3 portable oxygen concentrator for hypoxia.  . AMBULATORY NON FORMULARY MEDICATION Portable oxygen compressor device for portable O2 administration continuous 2L/min while walking. Dx COPD w/ hypoxia. Titrate for OCD (oxygen conserving device).  [name] atenolol (TENORMIN) 25 MG tablet Take 1 tablet (25 mg total) by mouth daily.  [name] azelastine (ASTELIN) 0.1 % nasal spray Place 1 spray into both nostrils 2 (two) times daily. Use in each nostril as directed  . Calcium Carbonate-Vitamin D (CALCIUM-VITAMIN D) 500-200 MG-UNIT per tablet Take 1 tablet by mouth daily.  . clonazePAM (KLONOPIN) 0.5 MG tablet TAKE 1 TABLET BY MOUTH 3 TIMES A DAY  . Cobalamine Combinations (B-12) [phone] MCG SUBL Place under the tongue.  . Cyanocobalamin (B-12) 500 MCG SUBL Place 1,000 mcg under the tongue daily.  . DULERA 100-5 MCG/ACT AERO TAKE 2 PUFFS BY MOUTH TWICE A DAY  . ferrous sulfate 325 (65 FE) MG EC tablet 1 tab PO TID every other day  . fluticasone (FLONASE) 50 MCG/ACT nasal spray Place 2 sprays into both nostrils daily.  [name] levothyroxine (SYNTHROID, LEVOTHROID) 75 MCG tablet TAKE 1 TABLET BY MOUTH EVERY DAY  . MAGNESIUM PO Take by mouth.  . predniSONE (DELTASONE) 50 MG tablet Take one tablet for 5 days.  [name] PROAIR HFA 108 (90 Base) MCG/ACT inhaler TAKE 2 PUFFS BY MOUTH EVERY 6 HOURS AS NEEDED FOR WHEEZE OR SHORTNESS OF BREATH  . ranitidine (ZANTAC) 150 MG tablet Take 1 tablet (150 mg total) by mouth 2 (two) times daily.  [name] SPIRIVA RESPIMAT 2.5 MCG/ACT AERS TAKE 2 PUFFS ONCE  A DAY.  . [DISCONTINUED] clonazePAM (KLONOPIN) 0.5 MG tablet TAKE 1 TABLET BY MOUTH 3 TIMES A DAY  . amoxicillin-clavulanate (AUGMENTIN) 875-125 MG tablet Take 1 tablet by mouth 2 (two) times daily. (Patient not taking: Reported on 01/02/2019)   No facility-administered encounter medications on file as of 01/02/2019.     Family History; Family History  Problem Relation Age of Onset  . Anxiety  disorder Mother   . OCD Daughter   . Alcohol abuse Father   . Anxiety disorder Sister        Labs:  Recent Results (from the past 2160 hour(s))  POCT Urinalysis Dipstick     Status: Abnormal   Collection Time: 11/21/18  2:59 PM  Result Value Ref Range   Color, UA Yellow    Clarity, UA Clear    Glucose, UA Negative Negative   Bilirubin, UA Negative    Ketones, UA Negative    Spec Grav, UA 1.010 1.010 - 1.025   Blood, UA Trace- Lysed    pH, UA 5.5 5.0 - 8.0   Protein, UA Negative Negative   Urobilinogen, UA 0.2 0.2 or 1.0 E.U./dL   Nitrite, UA Negative    Leukocytes, UA Moderate (2+) (A) Negative   Appearance     Odor    Urine Culture     Status: Abnormal   Collection Time: 11/21/18  3:01 PM  Result Value Ref Range   MICRO NUMBER: [id number]    SPECIMEN QUALITY: Adequate    Sample Source URINE    STATUS: FINAL    ISOLATE 1: Escherichia coli (A)     Comment: Greater than 100,000 CFU/mL of Escherichia coli      Susceptibility   Escherichia coli - URINE CULTURE, REFLEX    AMOX/CLAVULANIC 8 Sensitive     AMPICILLIN >=32 Resistant     AMPICILLIN/SULBACTAM 16 Intermediate     CEFAZOLIN* <=4 Not Reportable      * For infections other than uncomplicated UTIcaused by E. coli, K. pneumoniae or P. mirabilis:Cefazolin is resistant if MIC > or = 8 mcg/mL.(Distinguishing susceptible versus intermediatefor isolates with MIC < or = 4 mcg/mL requiresadditional testing.)For uncomplicated UTI caused by E. coli,K. pneumoniae or P. mirabilis: Cefazolin issusceptible if MIC <32 mcg/mL and predictssusceptible to the oral agents cefaclor, cefdinir,cefpodoxime, cefprozil, cefuroxime, cephalexinand loracarbef.    CEFEPIME <=1 Sensitive     CEFTRIAXONE <=1 Sensitive     CIPROFLOXACIN >=4 Resistant     LEVOFLOXACIN >=8 Resistant     ERTAPENEM <=0.5 Sensitive     GENTAMICIN <=1 Sensitive     IMIPENEM <=0.25 Sensitive     NITROFURANTOIN <=16 Sensitive     PIP/TAZO <=4 Sensitive     TOBRAMYCIN <=1  Sensitive     TRIMETH/SULFA* <=20 Sensitive      * For infections other than uncomplicated UTIcaused by E. coli, K. pneumoniae or P. mirabilis:Cefazolin is resistant if MIC > or = 8 mcg/mL.(Distinguishing susceptible versus intermediatefor isolates with MIC < or = 4 mcg/mL requiresadditional testing.)For uncomplicated UTI caused by E. coli,K. pneumoniae or P. mirabilis: Cefazolin issusceptible if MIC <32 mcg/mL and predictssusceptible to the oral agents cefaclor, cefdinir,cefpodoxime, cefprozil, cefuroxime, cephalexinand loracarbef.Legend:S = Susceptible  I = IntermediateR = Resistant  NS = Not susceptible* = Not tested  NR = Not reported**NN = See antimicrobic comments  POCT Urinalysis Dipstick     Status: Abnormal   Collection Time: 12/21/18 11:54 AM  Result Value Ref Range   Color, UA yellow    Clarity,  UA clear    Glucose, UA Negative Negative   Bilirubin, UA negative    Ketones, UA negative    Spec Grav, UA <=1.005 (A) 1.010 - 1.025   Blood, UA negative    pH, UA 5.5 5.0 - 8.0   Protein, UA Negative Negative   Urobilinogen, UA 0.2 0.2 or 1.0 E.U./dL   Nitrite, UA negative    Leukocytes, UA Trace (A) Negative   Appearance     Odor    Urine Culture     Status: None   Collection Time: 12/21/18 11:54 AM  Result Value Ref Range   MICRO NUMBER: [id number]    SPECIMEN QUALITY: Adequate    Sample Source URINE    STATUS: FINAL    Result: No Growth   BASIC METABOLIC PANEL WITH GFR     Status: Abnormal   Collection Time: 01/01/19  1:31 PM  Result Value Ref Range   Glucose, Bld 82 65 - 99 mg/dL    Comment: .            Fasting reference interval .    BUN 10 7 - 25 mg/dL   Creat 5.401.07 (H) 9.810.50 - 0.99 mg/dL    Comment: For patients >68 years of age, the reference limit for Creatinine is approximately 13% higher for people identified as African-American. .    GFR, Est Non African American 54 (L) > OR = 60 mL/min/1.373m2   GFR, Est African American 62 > OR = 60 mL/min/1.4873m2    BUN/Creatinine Ratio 9 6 - 22 (calc)   Sodium 136 135 - 146 mmol/L   Potassium 4.1 3.5 - 5.3 mmol/L   Chloride 94 (L) 98 - 110 mmol/L   CO2 32 20 - 32 mmol/L   Calcium 9.1 8.6 - 10.4 mg/dL      Mental Status Examination;   Psychiatric Specialty Exam: Physical Exam  Constitutional: She appears well-nourished.  Skin: She is not diaphoretic.    Review of Systems  Constitutional: Negative for fever.  Cardiovascular: Negative for chest pain and palpitations.  Gastrointestinal: Negative for nausea.  Skin: Negative for itching.  Neurological: Negative for tremors and headaches.  Psychiatric/Behavioral: Negative for suicidal ideas.    Blood pressure 110/68, pulse 89, height 5\' 3"  (1.6 m), weight 123 lb (55.8 kg).Body mass index is 21.79 kg/m.  General Appearance: Casual  Eye Contact::  Fair  Speech:  Slow  Volume:  Decreased  Mood:fair  Affect:  congruent  Thought Process:  Coherent and intact  Orientation:  Full (Time, Place, and Person)  Thought Content:  Rumination  Suicidal Thoughts:  No  Homicidal Thoughts:  No  Memory:  Immediate;   Fair Recent;   Fair  Judgement:  Fair  Insight:  Shallow  Psychomotor Activity:  Normal  Concentration:  Fair  Recall:  Fair  Akathisia:  Negative  Handed:  Right  AIMS (if indicated):     Assets:  Social Support Vocational/Educational  Sleep:        Assessment: Axis I: Panic disorder. Possible PTSD. Rule out generalized anxiety disorder. Nicotine dependence  Axis II: Deferred  Axis III:  Past Medical History:  Diagnosis Date  . Acid reflux   . Anxiety   . High blood pressure   . High serum parathyroid hormone (PTH) 07/19/2017   96 06/28/2014, 162 12/29/16  . Hypothyroid   . PTSD (post-traumatic stress disorder)     Axis IV: Psychosocial. History of trauma   Treatment Plan and Summary:  Anxiety and  GAD: klonopine helps, doesn't want to cut down,   PTSD: baseline. continuie disraction techniques Does not want to be  on sSRi  But just klonopine   Panic attacks:infequent Continue klonopine. Cautioned about side effects and tolerance FU 2-3 months  Thresa Ross, MD 01/02/2019

## 2019-01-11 ENCOUNTER — Other Ambulatory Visit: Payer: Self-pay | Admitting: Physician Assistant

## 2019-01-11 DIAGNOSIS — J432 Centrilobular emphysema: Secondary | ICD-10-CM

## 2019-01-13 ENCOUNTER — Other Ambulatory Visit: Payer: Self-pay | Admitting: Physician Assistant

## 2019-01-14 ENCOUNTER — Telehealth: Payer: Self-pay | Admitting: Physician Assistant

## 2019-01-14 NOTE — Telephone Encounter (Signed)
Patient called in and spoke with Shanda Bumps to get an appointment with PCP for UTI symptoms, and Claire [name] did not have an appointment until 01/15/2019. Shanda Bumps had transferred the call to me and I spoke with the patient. I had offered her an appointment with another provider and she refused to see another provider.  She was advised to go to urgent care or the ER if her symptoms became worse and she declined also. Patient has an appoint,ent with PCP on 01/15/2019. Claire [name] is aware.

## 2019-01-15 ENCOUNTER — Ambulatory Visit: Payer: Self-pay | Admitting: Physician Assistant

## 2019-01-16 ENCOUNTER — Ambulatory Visit (INDEPENDENT_AMBULATORY_CARE_PROVIDER_SITE_OTHER): Payer: Medicare Other | Admitting: Physician Assistant

## 2019-01-16 ENCOUNTER — Encounter: Payer: Self-pay | Admitting: Physician Assistant

## 2019-01-16 VITALS — BP 125/75 | HR 92 | Temp 98.1°F | Ht 63.0 in | Wt 116.0 lb

## 2019-01-16 DIAGNOSIS — R8290 Unspecified abnormal findings in urine: Secondary | ICD-10-CM

## 2019-01-16 DIAGNOSIS — J432 Centrilobular emphysema: Secondary | ICD-10-CM | POA: Diagnosis not present

## 2019-01-16 DIAGNOSIS — N390 Urinary tract infection, site not specified: Secondary | ICD-10-CM | POA: Diagnosis not present

## 2019-01-16 LAB — POCT URINALYSIS DIPSTICK
Bilirubin, UA: NEGATIVE
Blood, UA: NEGATIVE
Glucose, UA: NEGATIVE
KETONES UA: NEGATIVE
Nitrite, UA: NEGATIVE
Protein, UA: NEGATIVE
Spec Grav, UA: <=1.005 — AB (ref 1.010–1.025)
Urobilinogen, UA: 0.2 E.U./dL
pH, UA: 6 (ref 5.0–8.0)

## 2019-01-16 MED ORDER — PREDNISONE 50 MG PO TABS: ORAL_TABLET | ORAL | 0 refills | Status: DC

## 2019-01-16 MED ORDER — AMBULATORY NON FORMULARY MEDICATION: 5 refills | Status: DC
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
## 2019-01-17 LAB — URINE CULTURE
MICRO NUMBER:: 186756
SPECIMEN QUALITY: ADEQUATE

## 2019-01-18 NOTE — Progress Notes (Signed)
Call pt: no abnormal bacteria found on culture.

## 2019-01-21 ENCOUNTER — Telehealth (HOSPITAL_COMMUNITY): Payer: Self-pay | Admitting: Psychiatry

## 2019-01-21 MED ORDER — HYDROXYZINE HCL 25 MG PO TABS: 25.0000 mg | ORAL_TABLET | Freq: Every day | ORAL | 0 refills | Status: DC | PRN

## 2019-01-21 NOTE — Telephone Encounter (Signed)
Spoke with patient and explained everything that Dr. Gilmore Laroche stated in the previous message. Patient states she will try the hydroxyzine and will call to make a therapy appt when things settle down for her. Sent in a one month supply of hydroxyzine to the pharmacy of patients' choice. Nothing further is needed at this time.
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
## 2019-01-21 NOTE — Telephone Encounter (Signed)
She is on klonopine tid , that is a reasonably good dose for anxiety and panic. She does not want to be on SSRI in the past. Can take vistaril/hydoxyzine 25mg  if needed. Also to schedule appointment for therapy. ED for any urgent concers

## 2019-01-22 ENCOUNTER — Telehealth: Payer: Self-pay

## 2019-01-22 NOTE — Telephone Encounter (Signed)
Pt called requesting a copy of recent labs to be faxed to her Nephrologist, Dr Sharol Harness, at [facility].   This has been completed. FYI to PCP

## 2019-01-22 NOTE — Telephone Encounter (Signed)
Thanks

## 2019-01-30 ENCOUNTER — Telehealth: Payer: Self-pay

## 2019-01-30 NOTE — Telephone Encounter (Signed)
Left pt detailed msg on ID'd VM of providers recommendations. Call back info provided

## 2019-01-30 NOTE — Telephone Encounter (Signed)
This is always risk to get sick when you go into public and hospitals. You can wear a mask and wash hands frequently. You could give the receptionist your phone number and stay out of the waiting room while surgery is going on and minimize your exposure in these areas. I hope this helps. Will be thinking of you during this hard time. I hope this makes Claire [name] feel so much better.

## 2019-01-30 NOTE — Telephone Encounter (Signed)
Claire [name] called and states her daughter is having surgery. She is worried if she goes and waits in the waiting room she will caught the flu. Please advise.

## 2019-01-31 NOTE — Progress Notes (Deleted)
Subjective:   Claire [name] is a 68 y.o. female who presents for an Initial Medicare Annual Wellness Visit.  Review of Systems    No ROS.  Medicare Wellness Visit. Additional risk factors are reflected in the social history.     Sleep patterns: Home Safety/Smoke Alarms: Feels safe in home. Smoke alarms in place.  Living environment; Seat Belt Safety/Bike Helmet: Wears seat belt.   Female:   Pap- aged out      Mammo-       Dexa scan-        [facility]-     Objective:    There were no vitals filed for this visit. There is no height or weight on file to calculate BMI.  No flowsheet data found.  Current Medications (verified) Outpatient Encounter Medications as of 02/05/2019  Medication Sig  . albuterol (PROVENTIL) (2.5 MG/3ML) 0.083% nebulizer solution Take 3 mLs (2.5 mg total) by nebulization every 4 (four) hours as needed for wheezing or shortness of breath.  . AMBULATORY NON FORMULARY MEDICATION Continuous stationary and portable oxygen tanks for use with ambulation. DX: COPD with hypoxia.  . AMBULATORY NON FORMULARY MEDICATION Home concentrator and one Claire [name] G3 portable oxygen concentrator for hypoxia.  . AMBULATORY NON FORMULARY MEDICATION Portable oxygen compressor device for portable O2 administration continuous 2L/min while walking. Dx COPD w/ hypoxia. Titrate for OCD (oxygen conserving device).  . AMBULATORY NON FORMULARY MEDICATION One pulse ox to manage SOB and O2 use.  [name] atenolol (TENORMIN) 25 MG tablet Take 1 tablet (25 mg total) by mouth daily.  [name] azelastine (ASTELIN) 0.1 % nasal spray Place 1 spray into both nostrils 2 (two) times daily. Use in each nostril as directed  . Calcium Carbonate-Vitamin D (CALCIUM-VITAMIN D) 500-200 MG-UNIT per tablet Take 1 tablet by mouth daily.  . clonazePAM (KLONOPIN) 0.5 MG tablet TAKE 1 TABLET BY MOUTH 3 TIMES A DAY  . Cobalamine Combinations (B-12) [phone] MCG SUBL Place under the tongue.  . Cyanocobalamin (B-12) 500 MCG SUBL Place  1,000 mcg under the tongue daily.  . DULERA 100-5 MCG/ACT AERO TAKE 2 PUFFS BY MOUTH TWICE A DAY  . ferrous sulfate 325 (65 FE) MG EC tablet 1 tab PO TID every other day  . fluticasone (FLONASE) 50 MCG/ACT nasal spray Place 2 sprays into both nostrils daily.  . hydrOXYzine (ATARAX/VISTARIL) 25 MG tablet Take 1 tablet (25 mg total) by mouth daily as needed. Take 1 tablet daily as needed for anxiety.  [name] levothyroxine (SYNTHROID, LEVOTHROID) 75 MCG tablet TAKE 1 TABLET BY MOUTH EVERY DAY  . MAGNESIUM PO Take by mouth.  . predniSONE (DELTASONE) 50 MG tablet Take one tablet for 5 days.  [name] PROAIR HFA 108 (90 Base) MCG/ACT inhaler TAKE 2 PUFFS BY MOUTH EVERY 6 HOURS AS NEEDED FOR WHEEZE OR SHORTNESS OF BREATH  . ranitidine (ZANTAC) 150 MG tablet Take 1 tablet (150 mg total) by mouth 2 (two) times daily.  [name] SPIRIVA RESPIMAT 2.5 MCG/ACT AERS TAKE 2 PUFFS ONCE A DAY.   No facility-administered encounter medications on file as of 02/05/2019.     Allergies (verified) Bactrim [sulfamethoxazole-trimethoprim]; Macrodantin [nitrofurantoin macrocrystal]; and Zithromax [azithromycin]   History: Past Medical History:  Diagnosis Date  . Acid reflux   . Anxiety   . High blood pressure   . High serum parathyroid hormone (PTH) 07/19/2017   96 06/28/2014, 162 12/29/16  . Hypothyroid   . PTSD (post-traumatic stress disorder)    No past surgical history on file.  Family History  Problem Relation Age of Onset  . Anxiety disorder Mother   . OCD Daughter   . Alcohol abuse Father   . Anxiety disorder Sister    Social History   Socioeconomic History  . Marital status: Single    Spouse name: Not on file  . Number of children: Not on file  . Years of education: Not on file  . Highest education level: Not on file  Occupational History  . Not on file  Social Needs  . Financial resource strain: Not on file  . Food insecurity:    Worry: Not on file    Inability: Not on file  . Transportation needs:     Medical: Not on file    Non-medical: Not on file  Tobacco Use  . Smoking status: Former Smoker    Packs/day: 0.25    Years: 50.00    Pack years: 12.50    Types: Cigarettes  . Smokeless tobacco: Never Used  Substance and Sexual Activity  . Alcohol use: No  . Drug use: No  . Sexual activity: Never  Lifestyle  . Physical activity:    Days per week: Not on file    Minutes per session: Not on file  . Stress: Not on file  Relationships  . Social connections:    Talks on phone: Not on file    Gets together: Not on file    Attends religious service: Not on file    Active member of club or organization: Not on file    Attends meetings of clubs or organizations: Not on file    Relationship status: Not on file  Other Topics Concern  . Not on file  Social History Narrative  . Not on file    Tobacco Counseling Counseling given: Not Answered   Clinical Intake:                        Activities of Daily Living No flowsheet data found.   Immunizations and Health Maintenance Immunization History  Administered Date(s) Administered  . Influenza,inj,Quad PF,6+ Mos 09/26/2018  . Pneumococcal Polysaccharide-23 11/05/2018  . Tdap 12/05/2010   Health Maintenance Due  Topic Date Due  . Hepatitis C Screening  Apr 12, 1951  . COLON CANCER SCREENING ANNUAL FOBT  07/10/2018    Patient Care Team: Nolene Ebbs as PCP - General (Family Medicine)  Indicate any recent Medical Services you may have received from other than [facility] providers in the past year (date may be approximate).     Assessment:   This is a routine wellness examination for Claire [name].Physical assessment deferred to PCP.   Hearing/Vision screen No exam data present  Dietary issues and exercise activities discussed:   Diet  Breakfast: Lunch:  Dinner:       Goals   None    Depression Screen PHQ 2/9 Scores 09/12/2018 09/26/2017  PHQ - 2 Score 1 0  PHQ- 9 Score 3 -  Some encounter  information is confidential and restricted. Go to Review Flowsheets activity to see all data.    Fall Risk No flowsheet data found.  Is the patient's home free of loose throw rugs in walkways, pet beds, electrical cords, etc?   {Blank single:19197::"yes","no"}      Grab bars in the bathroom? {Blank single:19197::"yes","no"}      Handrails on the stairs?   {Blank single:19197::"yes","no"}      Adequate lighting?   {Blank single:19197::"yes","no"}   Cognitive Function:  Screening Tests Health Maintenance  Topic Date Due  . Hepatitis C Screening  [birth year]  . COLON CANCER SCREENING ANNUAL FOBT  07/10/2018  . MAMMOGRAM  08/03/2019 (Originally 01/03/2001)  . DEXA SCAN  08/03/2019 (Originally 01/04/2016)  . COLONOSCOPY  08/03/2019 (Originally 01/03/2001)  . PNA vac Low Risk Adult (2 of 2 - PCV13) 11/06/2019  . TETANUS/TDAP  12/05/2020  . INFLUENZA VACCINE  Completed        Plan:   ***  I have personally reviewed and noted the following in the patient's chart:   . Medical and social history . Use of alcohol, tobacco or illicit drugs  . Current medications and supplements . Functional ability and status . Nutritional status . Physical activity . Advanced directives . List of other physicians . Hospitalizations, surgeries, and ER visits in previous 12 months . Vitals . Screenings to include cognitive, depression, and falls . Referrals and appointments  In addition, I have reviewed and discussed with patient certain preventive protocols, quality metrics, and best practice recommendations. A written personalized care plan for preventive services as well as general preventive health recommendations were provided to patient.     Normand Sloop, LPN   3/00/7622

## 2019-02-05 ENCOUNTER — Ambulatory Visit: Payer: Self-pay

## 2019-02-06 ENCOUNTER — Ambulatory Visit: Payer: Self-pay | Admitting: Physician Assistant

## 2019-02-06 ENCOUNTER — Encounter: Payer: Self-pay | Admitting: Physician Assistant

## 2019-02-06 ENCOUNTER — Ambulatory Visit (INDEPENDENT_AMBULATORY_CARE_PROVIDER_SITE_OTHER): Payer: Medicare Other | Admitting: Physician Assistant

## 2019-02-06 VITALS — BP 122/70 | HR 94 | Temp 97.6°F | Wt 115.0 lb

## 2019-02-06 DIAGNOSIS — N3001 Acute cystitis with hematuria: Secondary | ICD-10-CM | POA: Diagnosis not present

## 2019-02-06 DIAGNOSIS — Z87440 Personal history of urinary (tract) infections: Secondary | ICD-10-CM

## 2019-02-06 DIAGNOSIS — R8290 Unspecified abnormal findings in urine: Secondary | ICD-10-CM | POA: Diagnosis not present

## 2019-02-06 LAB — POCT URINALYSIS DIPSTICK
Bilirubin, UA: NEGATIVE
Glucose, UA: NEGATIVE
Ketones, UA: NEGATIVE
Nitrite, UA: POSITIVE
Protein, UA: NEGATIVE
Spec Grav, UA: 1.01 (ref 1.010–1.025)
Urobilinogen, UA: 0.2 E.U./dL
pH, UA: 5.5 (ref 5.0–8.0)

## 2019-02-06 MED ORDER — TRIMETHOPRIM 100 MG PO TABS: 100.0000 mg | ORAL_TABLET | Freq: Every day | ORAL | 5 refills | Status: DC

## 2019-02-06 MED ORDER — AMOXICILLIN-POT CLAVULANATE 875-125 MG PO TABS: 1.0000 | ORAL_TABLET | Freq: Two times a day (BID) | ORAL | 0 refills | Status: DC

## 2019-02-06 NOTE — Patient Instructions (Signed)
Urinary Tract Infection, Adult A urinary tract infection (UTI) is an infection of any part of the urinary tract. The urinary tract includes:  The kidneys.  The ureters.  The bladder.  The urethra. These organs make, store, and get rid of pee (urine) in the body. What are the causes? This is caused by germs (bacteria) in your genital area. These germs grow and cause swelling (inflammation) of your urinary tract. What increases the risk? You are more likely to develop this condition if:  You have a small, thin tube (catheter) to drain pee.  You cannot control when you pee or poop (incontinence).  You are female, and: ? You use these methods to prevent pregnancy: ? A medicine that kills sperm (spermicide). ? A device that blocks sperm (diaphragm). ? You have low levels of a female hormone (estrogen). ? You are pregnant.  You have genes that add to your risk.  You are sexually active.  You take antibiotic medicines.  You have trouble peeing because of: ? A prostate that is bigger than normal, if you are female. ? A blockage in the part of your body that drains pee from the bladder (urethra). ? A kidney stone. ? A nerve condition that affects your bladder (neurogenic bladder). ? Not getting enough to drink. ? Not peeing often enough.  You have other conditions, such as: ? Diabetes. ? A weak disease-fighting system (immune system). ? Sickle cell disease. ? Gout. ? Injury of the spine. What are the signs or symptoms? Symptoms of this condition include:  Needing to pee right away (urgently).  Peeing often.  Peeing small amounts often.  Pain or burning when peeing.  Blood in the pee.  Pee that smells bad or not like normal.  Trouble peeing.  Pee that is cloudy.  Fluid coming from the vagina, if you are female.  Pain in the belly or lower back. Other symptoms include:  Throwing up (vomiting).  No urge to eat.  Feeling mixed up (confused).  Being tired  and grouchy (irritable).  A fever.  Watery poop (diarrhea). How is this treated? This condition may be treated with:  Antibiotic medicine.  Other medicines.  Drinking enough water. Follow these instructions at home:  Medicines  Take over-the-counter and prescription medicines only as told by your doctor.  If you were prescribed an antibiotic medicine, take it as told by your doctor. Do not stop taking it even if you start to feel better. General instructions  Make sure you: ? Pee until your bladder is empty. ? Do not hold pee for a long time. ? Empty your bladder after sex. ? Wipe from front to back after pooping if you are a female. Use each tissue one time when you wipe.  Drink enough fluid to keep your pee pale yellow.  Keep all follow-up visits as told by your doctor. This is important. Contact a doctor if:  You do not get better after 1-2 days.  Your symptoms go away and then come back. Get help right away if:  You have very bad back pain.  You have very bad pain in your lower belly.  You have a fever.  You are sick to your stomach (nauseous).  You are throwing up. Summary  A urinary tract infection (UTI) is an infection of any part of the urinary tract.  This condition is caused by germs in your genital area.  There are many risk factors for a UTI. These include having a small, thin   tube to drain pee and not being able to control when you pee or poop.  Treatment includes antibiotic medicines for germs.  Drink enough fluid to keep your pee pale yellow. This information is not intended to replace advice given to you by your health care provider. Make sure you discuss any questions you have with your health care provider. Document Released: 05/09/2008 Document Revised: 05/31/2018 Document Reviewed: 05/31/2018 Elsevier Interactive Patient Education  2019 Elsevier Inc.  

## 2019-02-06 NOTE — Progress Notes (Signed)
Subjective:    Patient ID: Claire [name], female    DOB: 1950-12-21, 68 y.o.   MRN: [id number]  HPI Pt is a 68 yo female with hx of recurrent UTI's, severe COPD, CKD-3 who presents to the clinic with cloudy urine. She denies any dysuria or increase in urine frequency. She does have some mild lower abdominal discomfort. No fever, chills, body aches, flank pain. She is concerned about UTI and would like to talk about prevention.   .. Active Ambulatory Problems    Diagnosis Date Noted  . PTSD (post-traumatic stress disorder) 11/10/2011  . Neurosis, anxiety, panic type 11/10/2011  . Acquired hypothyroidism 11/24/2014  . BP (high blood pressure) 07/24/2012  . Hypothyroid   . Chronic use of benzodiazepine for therapeutic purpose 08/11/2015  . Hyponatremia 12/19/2016  . Hyperkalemia 12/19/2016  . Centrilobular emphysema (HCC) 12/20/2016  . Decreased hemoglobin 12/20/2016  . CKD (chronic kidney disease) stage 3, GFR 30-59 ml/min (HCC) 01/16/2017  . Iron deficiency anemia 01/16/2017  . Anemia of chronic disease 05/11/2017  . Low serum vitamin B12 05/14/2017  . Vitamin D deficiency 05/14/2017  . Depressed mood 05/14/2017  . High serum parathyroid hormone (PTH) 07/19/2017  . Normocytic anemia 07/19/2017  . DOE (dyspnea on exertion) 05/09/2018   Resolved Ambulatory Problems    Diagnosis Date Noted  . Acute renal failure (HCC) 12/19/2016  . Acute kidney injury (HCC) 03/17/2017   Past Medical History:  Diagnosis Date  . Acid reflux   . Anxiety   . High blood pressure       Review of Systems See HPI>     Objective:   Physical Exam Vitals signs reviewed.  HENT:     Head: Normocephalic.  Cardiovascular:     Rate and Rhythm: Normal rate.     Pulses: Normal pulses.  Pulmonary:     Effort: Pulmonary effort is normal.  Abdominal:     Tenderness: There is no abdominal tenderness. There is no guarding or rebound.  Neurological:     General: No focal deficit present.     Mental  Status: She is alert and oriented to person, place, and time.  Psychiatric:        Mood and Affect: Mood normal.           Assessment & Plan:  [name] KitchenMarland KitchenVonzella was seen today for urinary tract infection.  Diagnoses and all orders for this visit:  Acute cystitis with hematuria -     Urine Culture -     Urine Culture -     amoxicillin-clavulanate (AUGMENTIN) 875-125 MG tablet; Take 1 tablet by mouth 2 (two) times daily.  Cloudy urine -     POCT Urinalysis Dipstick  History of recurrent UTIs -     trimethoprim (TRIMPEX) 100 MG tablet; Take 1 tablet (100 mg total) by mouth daily. For UTI prevention.    .. Results for orders placed or performed in visit on 02/06/19  POCT Urinalysis Dipstick  Result Value Ref Range   Color, UA yellow    Clarity, UA cloudy    Glucose, UA Negative Negative   Bilirubin, UA Neg    Ketones, UA Neg    Spec Grav, UA 1.010 1.010 - 1.025   Blood, UA moderate    pH, UA 5.5 5.0 - 8.0   Protein, UA Negative Negative   Urobilinogen, UA 0.2 0.2 or 1.0 E.U./dL   Nitrite, UA positive    Leukocytes, UA Large (3+) (A) Negative   Appearance  Odor     Positive for nitrite, leukocytes, blood. Treated with augmentin based on last culture and patients allergies. Will culture.   Last 2 cultures were not found to have bacteria. Pt wants to start prevention. Start trimethoprim daily after finish augmentin. Given urine cup and lab slip to return urine for test of clearance after finish augmentin.   On 2L of O2 breathing within normal limits today.

## 2019-02-08 LAB — URINE CULTURE
MICRO NUMBER:: 276584
SPECIMEN QUALITY:: ADEQUATE

## 2019-02-09 NOTE — Progress Notes (Signed)
Call pt: e.coli detected. Sensitive to augmentin. Treatment plan stays the same.

## 2019-02-12 ENCOUNTER — Other Ambulatory Visit (HOSPITAL_COMMUNITY): Payer: Self-pay | Admitting: Psychiatry

## 2019-02-14 ENCOUNTER — Other Ambulatory Visit: Payer: Self-pay

## 2019-02-14 DIAGNOSIS — R0982 Postnasal drip: Secondary | ICD-10-CM

## 2019-02-14 MED ORDER — AZELASTINE HCL 0.1 % NA SOLN: 1.0000 | Freq: Two times a day (BID) | NASAL | 2 refills | Status: DC

## 2019-02-15 ENCOUNTER — Telehealth: Payer: Self-pay

## 2019-02-15 ENCOUNTER — Other Ambulatory Visit: Payer: Self-pay | Admitting: Physician Assistant

## 2019-02-15 MED ORDER — FLUCONAZOLE 150 MG PO TABS: 150.0000 mg | ORAL_TABLET | Freq: Once | ORAL | 0 refills | Status: AC

## 2019-02-15 NOTE — Telephone Encounter (Signed)
Ok to send diflucan 150mg  one tablet NRF.

## 2019-02-15 NOTE — Telephone Encounter (Signed)
Medication sent and patient advised. 

## 2019-02-15 NOTE — Telephone Encounter (Signed)
Claire [name] called and states the antibiotic has caused a yeast infection. She has vaginal itching. Please advise.

## 2019-03-06 ENCOUNTER — Encounter: Payer: Self-pay | Admitting: Physician Assistant

## 2019-03-06 ENCOUNTER — Ambulatory Visit (INDEPENDENT_AMBULATORY_CARE_PROVIDER_SITE_OTHER): Payer: Medicare Other | Admitting: Physician Assistant

## 2019-03-06 ENCOUNTER — Telehealth: Payer: Self-pay | Admitting: Physician Assistant

## 2019-03-06 VITALS — HR 88 | Temp 98.7°F

## 2019-03-06 DIAGNOSIS — F17210 Nicotine dependence, cigarettes, uncomplicated: Secondary | ICD-10-CM

## 2019-03-06 DIAGNOSIS — J302 Other seasonal allergic rhinitis: Secondary | ICD-10-CM | POA: Diagnosis not present

## 2019-03-06 NOTE — Progress Notes (Signed)
Patient ID: Claire [name], female   DOB: 1951/06/01, 68 y.o.   MRN: [id number] .Virtual Visit via Telephone Note  I connected with Claire [name] on 03/06/19 at  2:40 PM EDT by telephone and verified that I am speaking with the correct person using two identifiers.   I discussed the limitations, risks, security and privacy concerns of performing an evaluation and management service by telephone and the availability of in person appointments. I also discussed with the patient that there may be a patient responsible charge related to this service. The patient expressed understanding and agreed to proceed.   History of Present Illness: Patient is a 68 year old female with severe COPD, CKD, current smoker who calls in to the clinic to discuss 1 day of sinus drainage and coughing up mucus.  Symptoms started this morning when she woke up.  She just felt some drainage going down the back of her throat and seemed to cough some more.  She denies any shortness of breath or wheezing.  She denies any sinus pressure, ear pain, headache.  She does have history of allergies and not taking anything for allergies right now.  Due to her chronic diseases she has not left the house in 3 weeks.  Her daughter brings her groceries and places them on the front porch.  She has had limited to no contact with the outside.  She has walked outside a few times and noticed how much pollen is everywhere.  She is concerned and just wanted to reach out and talk.  .. Active Ambulatory Problems    Diagnosis Date Noted  . PTSD (post-traumatic stress disorder) 11/10/2011  . Neurosis, anxiety, panic type 11/10/2011  . Acquired hypothyroidism 11/24/2014  . BP (high blood pressure) 07/24/2012  . Hypothyroid   . Chronic use of benzodiazepine for therapeutic purpose 08/11/2015  . Hyponatremia 12/19/2016  . Hyperkalemia 12/19/2016  . Centrilobular emphysema (HCC) 12/20/2016  . Decreased hemoglobin 12/20/2016  . CKD (chronic kidney  disease) stage 3, GFR 30-59 ml/min (HCC) 01/16/2017  . Iron deficiency anemia 01/16/2017  . Anemia of chronic disease 05/11/2017  . Low serum vitamin B12 05/14/2017  . Vitamin D deficiency 05/14/2017  . Depressed mood 05/14/2017  . High serum parathyroid hormone (PTH) 07/19/2017  . Normocytic anemia 07/19/2017  . DOE (dyspnea on exertion) 05/09/2018   Resolved Ambulatory Problems    Diagnosis Date Noted  . Acute renal failure (HCC) 12/19/2016  . Acute kidney injury (HCC) 03/17/2017   Past Medical History:  Diagnosis Date  . Acid reflux   . Anxiety   . High blood pressure    Reviewed med, allegry, problem list.    Observations/Objective: No acute distress.  She did not sound out of breath or like her breathing was labored.   .. Today's Vitals   03/06/19 1444  Pulse: 88  Temp: 98.7 F (37.1 C)  TempSrc: Oral   There is no height or weight on file to calculate BMI.   Assessment and Plan: [name] KitchenMarland KitchenDiagnoses and all orders for this visit:  Seasonal allergies   Reassured patient felt like her symptoms seem consistent with allergies.  Certainly we do need to keep a close watch on this since she has severe COPD.  Encouraged her to start Mucinex 600 mg twice a day and Zyrtec daily.  Rest and hydrate.  Patient is doing an excellent job of social distancing.  She has people bringing her groceries and place them on her front porch.  I certainly am concerned  if she were to get COVID-19 that her complications would be more severe than some.  Right now her vitals are very reassuring.  I would like to touch base with her before the weekend. Schedule another phone visit for Friday.    Follow Up Instructions:    I discussed the assessment and treatment plan with the patient. The patient was provided an opportunity to ask questions and all were answered. The patient agreed with the plan and demonstrated an understanding of the instructions.   The patient was advised to call back or seek  an in-person evaluation if the symptoms worsen or if the condition fails to improve as anticipated.  I provided 12 minutes of non-face-to-face time during this encounter.   Tandy Gaw, PA-C

## 2019-03-06 NOTE — Progress Notes (Signed)
Coughing, a lot of drainage, chest burning but doesn't feel bad. Woke up this morning like this. Been using nebulizer. Has a lot of phlegm.

## 2019-03-06 NOTE — Telephone Encounter (Signed)
PLEASE PUT ON SCHEDULE FOR Friday TELEPHONE AT 11:30 FOLLOW UP. WITH ME.

## 2019-03-08 ENCOUNTER — Ambulatory Visit (INDEPENDENT_AMBULATORY_CARE_PROVIDER_SITE_OTHER): Payer: Medicare Other | Admitting: Physician Assistant

## 2019-03-08 ENCOUNTER — Encounter: Payer: Self-pay | Admitting: Physician Assistant

## 2019-03-08 DIAGNOSIS — J302 Other seasonal allergic rhinitis: Secondary | ICD-10-CM | POA: Diagnosis not present

## 2019-03-08 DIAGNOSIS — J441 Chronic obstructive pulmonary disease with (acute) exacerbation: Secondary | ICD-10-CM | POA: Diagnosis not present

## 2019-03-08 MED ORDER — PREDNISONE 50 MG PO TABS: ORAL_TABLET | ORAL | 0 refills | Status: DC

## 2019-03-08 MED ORDER — AZITHROMYCIN 250 MG PO TABS: ORAL_TABLET | ORAL | 0 refills | Status: DC

## 2019-03-08 NOTE — Progress Notes (Signed)
Patient ID: Claire [name], female   DOB: [birth year], 68 y.o.   MRN: [id number]  .Virtual Visit via Telephone Note  I connected with Claire [name] on 03/11/19 at 11:30 AM EDT by telephone and verified that I am speaking with the correct person using two identifiers.   I discussed the limitations, risks, security and privacy concerns of performing an evaluation and management service by telephone and the availability of in person appointments. I also discussed with the patient that there may be a patient responsible charge related to this service. The patient expressed understanding and agreed to proceed.   History of Present Illness: Pt is a 68 yo female with severe COPD, CKD, anxiety who via telephone discusses cough, sinus drainage. She has an ongoing cough but seems to be producing more and more. Seems worse in the morning and better at night. She has been strictly staying in her house. Her daughter gets her groceries and leaves them on the doorstep. She does have a dog that goes outside and inside. She has walked around the yard a few times as well. She is using her nebulizer 1-2 times a day but that is not above what she usually uses. Right now her breathing is ok. She is not using her oxygen in more than usual. She is very worried about going to ER. She wonders if she needs antibiotic.   .. Active Ambulatory Problems    Diagnosis Date Noted  . PTSD (post-traumatic stress disorder) 11/10/2011  . Neurosis, anxiety, panic type 11/10/2011  . Acquired hypothyroidism 11/24/2014  . BP (high blood pressure) 07/24/2012  . Hypothyroid   . Chronic use of benzodiazepine for therapeutic purpose 08/11/2015  . Hyponatremia 12/19/2016  . Hyperkalemia 12/19/2016  . Centrilobular emphysema (HCC) 12/20/2016  . Decreased hemoglobin 12/20/2016  . CKD (chronic kidney disease) stage 3, GFR 30-59 ml/min (HCC) 01/16/2017  . Iron deficiency anemia 01/16/2017  . Anemia of chronic disease 05/11/2017  . Low serum  vitamin B12 05/14/2017  . Vitamin D deficiency 05/14/2017  . Depressed mood 05/14/2017  . High serum parathyroid hormone (PTH) 07/19/2017  . Normocytic anemia 07/19/2017  . DOE (dyspnea on exertion) 05/09/2018   Resolved Ambulatory Problems    Diagnosis Date Noted  . Acute renal failure (HCC) 12/19/2016  . Acute kidney injury (HCC) 03/17/2017   Past Medical History:  Diagnosis Date  . Acid reflux   . Anxiety   . High blood pressure     Reviewed med, allergy, and problem list.    Observations/Objective: No acute distress  .[name] KitchenThere were no vitals filed for this visit. There is no height or weight on file to calculate BMI.    Assessment and Plan: [name] KitchenMarland KitchenGeralyn was seen today for follow-up.  Diagnoses and all orders for this visit:  COPD exacerbation (HCC) -     predniSONE (DELTASONE) 50 MG tablet; One tab PO daily for 5 days. -     azithromycin (ZITHROMAX) 250 MG tablet; Take 2 tablet now and then one tablet for 4 days.  Seasonal allergies   Pt does have severe COPD that can rapidly worsen. I had a long discussion with her about how I feel right now she is suffering from more allergies. I would like for her to continue zyrtec and mucinex and give some more time. Continue all other inhalers. If she does NOT have a fever and having worsening SOB/cough then start zpak and prednisone for COPD exacerbation to keep her out of ED. If she does have  a fever, cough, SOB please call office to reassess COVID risk. Discussed some data leads [place] to believe prednisone could worsen COVID symptoms.  [name].Spent 30 minutes with patient and greater than 50 percent of visit spent counseling patient regarding treatment plan.   Follow Up Instructions:    I discussed the assessment and treatment plan with the patient. The patient was provided an opportunity to ask questions and all were answered. The patient agreed with the plan and demonstrated an understanding of the instructions.   The patient was  advised to call back or seek an in-person evaluation if the symptoms worsen or if the condition fails to improve as anticipated.  I provided 30 minutes of non-face-to-face time during this encounter.   Tandy Gaw, PA-C

## 2019-03-14 ENCOUNTER — Other Ambulatory Visit (HOSPITAL_COMMUNITY): Payer: Self-pay | Admitting: Psychiatry

## 2019-03-18 ENCOUNTER — Telehealth: Payer: Self-pay

## 2019-03-18 NOTE — Telephone Encounter (Signed)
Pt was given some Augmentin for UTI SX over the weekend.   Called pt and she states she has been taking medication and feeling better. No fever, urine no longer cloudy. Sometimes she has a little "twinge" in her side, but minor and seems to be improving.   States she may call later in the week for a follow up virtual visit. No further needs at his time.

## 2019-03-28 ENCOUNTER — Other Ambulatory Visit (HOSPITAL_COMMUNITY): Payer: Self-pay | Admitting: Psychiatry

## 2019-03-28 ENCOUNTER — Other Ambulatory Visit: Payer: Self-pay

## 2019-03-28 ENCOUNTER — Encounter (HOSPITAL_COMMUNITY): Payer: Self-pay | Admitting: Psychiatry

## 2019-03-28 ENCOUNTER — Ambulatory Visit (INDEPENDENT_AMBULATORY_CARE_PROVIDER_SITE_OTHER): Payer: Medicare Other | Admitting: Psychiatry

## 2019-03-28 DIAGNOSIS — F410 Panic disorder [episodic paroxysmal anxiety] without agoraphobia: Secondary | ICD-10-CM

## 2019-03-28 DIAGNOSIS — F4310 Post-traumatic stress disorder, unspecified: Secondary | ICD-10-CM

## 2019-03-28 DIAGNOSIS — F411 Generalized anxiety disorder: Secondary | ICD-10-CM

## 2019-03-28 MED ORDER — HYDROXYZINE HCL 25 MG PO TABS: ORAL_TABLET | ORAL | 0 refills | Status: DC

## 2019-03-28 MED ORDER — CLONAZEPAM 0.5 MG PO TABS: ORAL_TABLET | ORAL | 2 refills | Status: DC

## 2019-03-28 NOTE — Progress Notes (Signed)
Patient ID: Claire [name], female   DOB: 12/07/1950, 68 y.o.   MRN: [id number]  [name] Outpatient Follow up visit [address] [phone] 68 y.o.  03/28/2019 3:06 PM  Chief Complaint:  Establish care for panic attacks  History of Present Illness:  I connected with Carl Best on 03/28/19 at  3:00 PM EDT by telephone and verified that I am speaking with the correct person using two identifiers.   I discussed the limitations, risks, security and privacy concerns of performing an evaluation and management service by telephone and the availability of in person appointments. I also discussed with the patient that there may be a patient responsible charge related to this service. The patient expressed understanding and agreed to proceed.  Has called earlier for anxiey, already on kklonopine, vistaril was started and doing fair.  styaying home. .  Doing fair now living independently not with her daughter   Continues take Klonopin 3 times a day does not want to cut down says she starts having panic attack . Understands the risk of forgetfullness but says memory is good  Modifying factor: breahting techniques. grandkids   PTSD is related to her past some flashbacks. Distraction helps Depression not worse No side effects     Aggravating factors:daughter  No psychosis  Medical History; Past Medical History:  Diagnosis Date  . Acid reflux   . Anxiety   . High blood pressure   . High serum parathyroid hormone (PTH) 07/19/2017   96 06/28/2014, 162 12/29/16  . Hypothyroid   . PTSD (post-traumatic stress disorder)     Allergies: Allergies  Allergen Reactions  . Bactrim [Sulfamethoxazole-Trimethoprim]     ACUTE RENAL fAILURE/HYPONATREMIA/HYPERKALEMIA  . Macrodantin [Nitrofurantoin Macrocrystal] Rash  . Zithromax [Azithromycin]     diarrhea    Medications: Outpatient Encounter Medications as of 03/28/2019  Medication Sig  . albuterol  (PROVENTIL) (2.5 MG/3ML) 0.083% nebulizer solution Take 3 mLs (2.5 mg total) by nebulization every 4 (four) hours as needed for wheezing or shortness of breath.  . AMBULATORY NON FORMULARY MEDICATION Continuous stationary and portable oxygen tanks for use with ambulation. DX: COPD with hypoxia.  . AMBULATORY NON FORMULARY MEDICATION Home concentrator and one Imogen G3 portable oxygen concentrator for hypoxia.  . AMBULATORY NON FORMULARY MEDICATION Portable oxygen compressor device for portable O2 administration continuous 2L/min while walking. Dx COPD w/ hypoxia. Titrate for OCD (oxygen conserving device).  . AMBULATORY NON FORMULARY MEDICATION One pulse ox to manage SOB and O2 use.  [name] atenolol (TENORMIN) 25 MG tablet Take 1 tablet (25 mg total) by mouth daily.  [name] azelastine (ASTELIN) 0.1 % nasal spray Place 1 spray into both nostrils 2 (two) times daily. Use in each nostril as directed (Patient not taking: Reported on 03/06/2019)  . azithromycin (ZITHROMAX) 250 MG tablet Take 2 tablet now and then one tablet for 4 days.  . Calcium Carbonate-Vitamin D (CALCIUM-VITAMIN D) 500-200 MG-UNIT per tablet Take 1 tablet by mouth daily.  . clonazePAM (KLONOPIN) 0.5 MG tablet TAKE 1 TABLET BY MOUTH 3 TIMES A DAY  . Cobalamine Combinations (B-12) [phone] MCG SUBL Place under the tongue.  . Cyanocobalamin (B-12) 500 MCG SUBL Place 1,000 mcg under the tongue daily. (Patient not taking: Reported on 03/06/2019)  . DULERA 100-5 MCG/ACT AERO TAKE 2 PUFFS BY MOUTH TWICE A DAY  . ferrous sulfate 325 (65 FE) MG EC tablet 1 tab PO TID every other day  . fluticasone (FLONASE) 50 MCG/ACT nasal spray  Place 2 sprays into both nostrils daily. (Patient not taking: Reported on 03/06/2019)  . hydrOXYzine (ATARAX/VISTARIL) 25 MG tablet TAKE 1 TABLET EVERY DAY AS NEEDED ANXIETY  . levothyroxine (SYNTHROID, LEVOTHROID) 75 MCG tablet TAKE 1 TABLET BY MOUTH EVERY DAY  . MAGNESIUM PO Take by mouth.  . predniSONE (DELTASONE) 50 MG tablet  Take one tablet for 5 days.  . predniSONE (DELTASONE) 50 MG tablet One tab PO daily for 5 days.  [name] PROAIR HFA 108 (90 Base) MCG/ACT inhaler TAKE 2 PUFFS BY MOUTH EVERY 6 HOURS AS NEEDED FOR WHEEZE OR SHORTNESS OF BREATH  . ranitidine (ZANTAC) 150 MG tablet Take 1 tablet (150 mg total) by mouth 2 (two) times daily.  [name] SPIRIVA RESPIMAT 2.5 MCG/ACT AERS TAKE 2 PUFFS ONCE A DAY.  [name] trimethoprim (TRIMPEX) 100 MG tablet Take 1 tablet (100 mg total) by mouth daily. For UTI prevention.  . [DISCONTINUED] clonazePAM (KLONOPIN) 0.5 MG tablet TAKE 1 TABLET BY MOUTH 3 TIMES A DAY  . [DISCONTINUED] hydrOXYzine (ATARAX/VISTARIL) 25 MG tablet TAKE 1 TABLET EVERY DAY AS NEEDED ANXIETY   No facility-administered encounter medications on file as of 03/28/2019.     Family History; Family History  Problem Relation Age of Onset  . Anxiety disorder Mother   . OCD Daughter   . Alcohol abuse Father   . Anxiety disorder Sister        Labs:  Recent Results (from the past 2160 hour(s))  BASIC METABOLIC PANEL WITH GFR     Status: Abnormal   Collection Time: 01/01/19  1:31 PM  Result Value Ref Range   Glucose, Bld 82 65 - 99 mg/dL    Comment: .            Fasting reference interval .    BUN 10 7 - 25 mg/dL   Creat 6.54 (H) 6.50 - 0.99 mg/dL    Comment: For patients >37 years of age, the reference limit for Creatinine is approximately 13% higher for people identified as African-American. .    GFR, Est Non African American 54 (L) > OR = 60 mL/min/1.64m2   GFR, Est African American 62 > OR = 60 mL/min/1.44m2   BUN/Creatinine Ratio 9 6 - 22 (calc)   Sodium 136 135 - 146 mmol/L   Potassium 4.1 3.5 - 5.3 mmol/L   Chloride 94 (L) 98 - 110 mmol/L   CO2 32 20 - 32 mmol/L   Calcium 9.1 8.6 - 10.4 mg/dL  POCT Urinalysis Dipstick     Status: Abnormal   Collection Time: 01/16/19  1:41 PM  Result Value Ref Range   Color, UA yellow    Clarity, UA clear    Glucose, UA Negative Negative   Bilirubin, UA  Negative    Ketones, UA Negative    Spec Grav, UA <=1.005 (A) 1.010 - 1.025   Blood, UA Negative    pH, UA 6.0 5.0 - 8.0   Protein, UA Negative Negative   Urobilinogen, UA 0.2 0.2 or 1.0 E.U./dL   Nitrite, UA Negative    Leukocytes, UA Small (1+) (A) Negative   Appearance     Odor    Urine Culture     Status: None   Collection Time: 01/16/19  2:30 PM  Result Value Ref Range   MICRO NUMBER: [id number]    SPECIMEN QUALITY: Adequate    Sample Source URINE    STATUS: FINAL    Result:      Single organism less than 10,000 CFU/mL  isolated. These organisms, commonly found on external and internal genitalia, are considered colonizers. No further testing performed.  POCT Urinalysis Dipstick     Status: Abnormal   Collection Time: 02/06/19  2:23 PM  Result Value Ref Range   Color, UA yellow    Clarity, UA cloudy    Glucose, UA Negative Negative   Bilirubin, UA Neg    Ketones, UA Neg    Spec Grav, UA 1.010 1.010 - 1.025   Blood, UA moderate    pH, UA 5.5 5.0 - 8.0   Protein, UA Negative Negative   Urobilinogen, UA 0.2 0.2 or 1.0 E.U./dL   Nitrite, UA positive    Leukocytes, UA Large (3+) (A) Negative   Appearance     Odor    Urine Culture     Status: Abnormal   Collection Time: 02/06/19  2:27 PM  Result Value Ref Range   MICRO NUMBER: [id number]    SPECIMEN QUALITY: Adequate    Sample Source URINE    STATUS: FINAL    ISOLATE 1: Escherichia coli (A)     Comment: 50,000-100,000 CFU/mL of Escherichia coli      Susceptibility   Escherichia coli - URINE CULTURE, REFLEX    AMOX/CLAVULANIC 8 Sensitive     AMPICILLIN >=32 Resistant     AMPICILLIN/SULBACTAM 16 Intermediate     CEFAZOLIN* <=4 Not Reportable      * For infections other than uncomplicated UTIcaused by E. coli, K. pneumoniae or P. mirabilis:Cefazolin is resistant if MIC > or = 8 mcg/mL.(Distinguishing susceptible versus intermediatefor isolates with MIC < or = 4 mcg/mL requiresadditional testing.)For uncomplicated UTI caused  by E. coli,K. pneumoniae or P. mirabilis: Cefazolin issusceptible if MIC <32 mcg/mL and predictssusceptible to the oral agents cefaclor, cefdinir,cefpodoxime, cefprozil, cefuroxime, cephalexinand loracarbef.    CEFEPIME <=1 Sensitive     CEFTRIAXONE <=1 Sensitive     CIPROFLOXACIN >=4 Resistant     LEVOFLOXACIN >=8 Resistant     ERTAPENEM <=0.5 Sensitive     GENTAMICIN <=1 Sensitive     IMIPENEM 0.5 Sensitive     NITROFURANTOIN <=16 Sensitive     PIP/TAZO <=4 Sensitive     TOBRAMYCIN <=1 Sensitive     TRIMETH/SULFA* <=20 Sensitive      * For infections other than uncomplicated UTIcaused by E. coli, K. pneumoniae or P. mirabilis:Cefazolin is resistant if MIC > or = 8 mcg/mL.(Distinguishing susceptible versus intermediatefor isolates with MIC < or = 4 mcg/mL requiresadditional testing.)For uncomplicated UTI caused by E. coli,K. pneumoniae or P. mirabilis: Cefazolin issusceptible if MIC <32 mcg/mL and predictssusceptible to the oral agents cefaclor, cefdinir,cefpodoxime, cefprozil, cefuroxime, cephalexinand loracarbef.Legend:S = Susceptible  I = IntermediateR = Resistant  NS = Not susceptible* = Not tested  NR = Not reported**NN = See antimicrobic comments      Mental Status Examination;   Psychiatric Specialty Exam: Physical Exam  Review of Systems  Cardiovascular: Negative for chest pain and palpitations.  Skin: Negative for itching.  Neurological: Negative for tremors and headaches.  Psychiatric/Behavioral: Negative for depression and suicidal ideas.    There were no vitals taken for this visit.There is no height or weight on file to calculate BMI.  General Appearance:   Eye Contact::    Speech:  Slow  Volume:  Decreased  Mood: fair  Affect:  congruent  Thought Process:  Coherent and intact  Orientation:  Full (Time, Place, and Person)  Thought Content:  Rumination  Suicidal Thoughts:  No  Homicidal Thoughts:  No  Memory:  Immediate;   Fair Recent;   Fair  Judgement:  Fair   Insight:  Shallow  Psychomotor Activity:  Normal  Concentration:  Fair  Recall:  Fair  Akathisia:  Negative  Handed:  Right  AIMS (if indicated):     Assets:  Social Support Vocational/Educational  Sleep:        Assessment: Axis I: Panic disorder. Possible PTSD. Rule out generalized anxiety disorder. Nicotine dependence  Axis II: Deferred  Axis III:  Past Medical History:  Diagnosis Date  . Acid reflux   . Anxiety   . High blood pressure   . High serum parathyroid hormone (PTH) 07/19/2017   96 06/28/2014, 162 12/29/16  . Hypothyroid   . PTSD (post-traumatic stress disorder)     Axis IV: Psychosocial. History of trauma   Treatment Plan and Summary:  Anxiety and GAD: doing fair. On klonopoine, advised to not increase and take vistaril at times instead. Refills sent   PTSD: baseline. continuie disraction techniques Does not want to be on sSRi  But just klonopine    I discussed the assessment and treatment plan with the patient. The patient was provided an opportunity to ask questions and all were answered. The patient agreed with the plan and demonstrated an understanding of the instructions.   The patient was advised to call back or seek an in-person evaluation if the symptoms worsen or if the condition fails to improve as anticipated.  I provided 15 minutes of non-face-to-face time during this encounter. Fu [id number].   Thresa RossNadeem Maghan Jessee, MD 03/28/2019

## 2019-04-02 ENCOUNTER — Telehealth: Payer: Self-pay | Admitting: Physician Assistant

## 2019-04-02 NOTE — Telephone Encounter (Signed)
Pt called clinic to see what she can take for acid reflux. Reports she was on zantac but now there is a recall. She can't take Prilosec, she was advised in the past it interacted with other Rx's she takes. Routing.

## 2019-04-03 MED ORDER — FAMOTIDINE 20 MG PO TABS: 20.0000 mg | ORAL_TABLET | Freq: Two times a day (BID) | ORAL | 3 refills | Status: DC

## 2019-04-03 NOTE — Telephone Encounter (Signed)
Pt advised. Requested an Rx. Rx sent.

## 2019-04-03 NOTE — Telephone Encounter (Signed)
She can take pepcid 20 mg twice a day

## 2019-04-06 ENCOUNTER — Other Ambulatory Visit: Payer: Self-pay | Admitting: Physician Assistant

## 2019-04-06 DIAGNOSIS — J432 Centrilobular emphysema: Secondary | ICD-10-CM

## 2019-04-09 ENCOUNTER — Telehealth: Payer: Self-pay

## 2019-04-09 ENCOUNTER — Other Ambulatory Visit: Payer: Self-pay | Admitting: Physician Assistant

## 2019-04-09 MED ORDER — AMOXICILLIN-POT CLAVULANATE 875-125 MG PO TABS: 1.0000 | ORAL_TABLET | Freq: Two times a day (BID) | ORAL | 0 refills | Status: DC

## 2019-04-09 NOTE — Telephone Encounter (Signed)
Patient advised of recommendations.  

## 2019-04-09 NOTE — Telephone Encounter (Signed)
Sent augmentin

## 2019-04-09 NOTE — Telephone Encounter (Signed)
Claire [name] called and states she is having a productive cough and was going to take the Zpack and prednisone Claire [name] gave her is the case she started having symptoms. She states she is wanting an antibiotic but doesn't want the Zpack because it causes severe diarrhea. She was in the hospital do to the diarrhea the last time she took the Zpack. She would like penicillin instead.

## 2019-04-30 ENCOUNTER — Encounter: Payer: Self-pay | Admitting: Physician Assistant

## 2019-04-30 ENCOUNTER — Ambulatory Visit (INDEPENDENT_AMBULATORY_CARE_PROVIDER_SITE_OTHER): Payer: Medicare Other | Admitting: Physician Assistant

## 2019-04-30 VITALS — Ht 63.0 in | Wt 119.0 lb

## 2019-04-30 DIAGNOSIS — R200 Anesthesia of skin: Secondary | ICD-10-CM

## 2019-04-30 DIAGNOSIS — R252 Cramp and spasm: Secondary | ICD-10-CM

## 2019-04-30 DIAGNOSIS — E871 Hypo-osmolality and hyponatremia: Secondary | ICD-10-CM

## 2019-04-30 DIAGNOSIS — E538 Deficiency of other specified B group vitamins: Secondary | ICD-10-CM

## 2019-04-30 DIAGNOSIS — R202 Paresthesia of skin: Secondary | ICD-10-CM

## 2019-04-30 DIAGNOSIS — E875 Hyperkalemia: Secondary | ICD-10-CM | POA: Diagnosis not present

## 2019-04-30 DIAGNOSIS — D649 Anemia, unspecified: Secondary | ICD-10-CM

## 2019-04-30 DIAGNOSIS — R7989 Other specified abnormal findings of blood chemistry: Secondary | ICD-10-CM

## 2019-04-30 MED ORDER — DICLOFENAC SODIUM 1 % TD GEL: 4.0000 g | Freq: Four times a day (QID) | TRANSDERMAL | 1 refills | Status: DC

## 2019-04-30 NOTE — Progress Notes (Signed)
Patient ID: Claire [name], female   DOB: 1951-02-04, 68 y.o.   MRN: [id number] .[name] KitchenVirtual Visit via Telephone Note  I connected with Claire [name] on 04/30/19 at  2:20 PM EDT by telephone and verified that I am speaking with the correct person using two identifiers.  Location: Patient: home Provider: home   I discussed the limitations, risks, security and privacy concerns of performing an evaluation and management service by telephone and the availability of in person appointments. I also discussed with the patient that there may be a patient responsible charge related to this service. The patient expressed understanding and agreed to proceed.   History of Present Illness: Pt is a 68 yo female with pmhx of CKD 3, hyponateremia, hyperkalemia, low serum b12, anemia, elevated PTH who calls in with new onset lower leg and feet cramping and left hand numbness and tingling. This has been going on for the last few weeks. She is concerned it is her potassium. Pt denies any CP, palpitations, headaches, dizziness, vision changes. Her left hand numbness and tingling can occur any time but does seem to be worse at night and in the mornings. She does sleep on her left side. She denies any neck pain or recent injury.  Her leg/feet cramping seem to be more at night. She has not done anything to make better.   .. Active Ambulatory Problems    Diagnosis Date Noted  . PTSD (post-traumatic stress disorder) 11/10/2011  . Neurosis, anxiety, panic type 11/10/2011  . Acquired hypothyroidism 11/24/2014  . BP (high blood pressure) 07/24/2012  . Hypothyroid   . Chronic use of benzodiazepine for therapeutic purpose 08/11/2015  . Hyponatremia 12/19/2016  . Hyperkalemia 12/19/2016  . Centrilobular emphysema (HCC) 12/20/2016  . Decreased hemoglobin 12/20/2016  . CKD (chronic kidney disease) stage 3, GFR 30-59 ml/min (HCC) 01/16/2017  . Iron deficiency anemia 01/16/2017  . Anemia of chronic disease 05/11/2017  . Low  serum vitamin B12 05/14/2017  . Vitamin D deficiency 05/14/2017  . Depressed mood 05/14/2017  . High serum parathyroid hormone (PTH) 07/19/2017  . Normocytic anemia 07/19/2017  . DOE (dyspnea on exertion) 05/09/2018   Resolved Ambulatory Problems    Diagnosis Date Noted  . Acute renal failure (HCC) 12/19/2016  . Acute kidney injury (HCC) 03/17/2017   Past Medical History:  Diagnosis Date  . Acid reflux   . Anxiety   . High blood pressure    Reviewed med, allergy, problem list.     Observations/Objective: No acute distress. Normal breathing.  Anxious.   .. Today's Vitals   04/30/19 1332  Weight: 119 lb (54 kg)  Height: 5\' 3"  (1.6 m)   Body mass index is 21.08 kg/m.   Assessment and Plan: [name] KitchenMarland KitchenDiagnoses and all orders for this visit:  Numbness and tingling in left hand -     BASIC METABOLIC PANEL WITH GFR -     TSH -     B12 and Folate Panel -     CBC with Differential/Platelet -     Fe+TIBC+Fer  Hyponatremia -     BASIC METABOLIC PANEL WITH GFR -     TSH -     B12 and Folate Panel -     CBC with Differential/Platelet -     Fe+TIBC+Fer  Hyperkalemia -     BASIC METABOLIC PANEL WITH GFR -     TSH -     B12 and Folate Panel -     CBC with Differential/Platelet -  Fe+TIBC+Fer  Low serum vitamin B12 -     BASIC METABOLIC PANEL WITH GFR -     TSH -     B12 and Folate Panel -     CBC with Differential/Platelet -     Fe+TIBC+Fer  Normocytic anemia -     BASIC METABOLIC PANEL WITH GFR -     TSH -     B12 and Folate Panel -     CBC with Differential/Platelet -     Fe+TIBC+Fer  High serum parathyroid hormone (PTH) -     BASIC METABOLIC PANEL WITH GFR -     TSH -     B12 and Folate Panel -     CBC with Differential/Platelet -     Fe+TIBC+Fer  Leg cramps -     BASIC METABOLIC PANEL WITH GFR -     TSH -     B12 and Folate Panel -     CBC with Differential/Platelet -     Fe+TIBC+Fer  Other orders -     diclofenac sodium (VOLTAREN) 1 % GEL;  Apply 4 g topically 4 (four) times daily. To affected joint.  unclear etiology. Pt does not want to leave her house during the COVID pandemic. I ordered labs in case she changed her mind. Labs would rule out any metabolic issues to cause her symptoms.  I have a low suspicion for potassium being the issue. She has had 7 normal potassium over the last year. She could be cramping due to dehydration. Increase fluids. Her left hand numbness and tingling could be some carpal tunnel or cervical radiculopathy. Get wrist splint and sent voltaren gel. Follow up as needed.    Follow Up Instructions:    I discussed the assessment and treatment plan with the patient. The patient was provided an opportunity to ask questions and all were answered. The patient agreed with the plan and demonstrated an understanding of the instructions.   The patient was advised to call back or seek an in-person evaluation if the symptoms worsen or if the condition fails to improve as anticipated.  I provided 26 minutes of non-face-to-face time during this encounter.   Claire GawJade Creedon Danielski, PA-C

## 2019-04-30 NOTE — Progress Notes (Deleted)
Patient having tingling in fingers, and cramping in fingers and toes, and concerned about potassium level. It comes and goes.

## 2019-05-01 ENCOUNTER — Ambulatory Visit (HOSPITAL_COMMUNITY): Payer: Medicare Other | Admitting: Licensed Clinical Social Worker

## 2019-05-03 ENCOUNTER — Telehealth: Payer: Self-pay

## 2019-05-03 NOTE — Telephone Encounter (Signed)
Ok for recheck BMP.

## 2019-05-03 NOTE — Telephone Encounter (Signed)
Patient called and is looking for a service that will do her blood draw at home. She is very cautious about going to an office to have blood drawn. Called to let me know that she found a mobile phlebotomist she is going to call and get additional details to see if they will be able to do a blood draw for her. Will call back with update

## 2019-05-03 NOTE — Telephone Encounter (Signed)
"  [facility]" is the name of mobile unit. Patient called company and they advised her that they would need to contact our office to confirm the blood draw and insurance information to have blood draw done.      If company calls, I am more than happy to speak with them concerning this issue. I am working virtual today and if they call the office, please get a contact name and number for me. Thanks!

## 2019-05-03 NOTE — Telephone Encounter (Signed)
Pt has update and wants you to call her.  Thanks

## 2019-05-06 NOTE — Telephone Encounter (Signed)
Scheduled for phone call visit tomorrow

## 2019-05-06 NOTE — Telephone Encounter (Signed)
Lets talk tomorrow about it!

## 2019-05-06 NOTE — Telephone Encounter (Signed)
Received msg to call from [facility] with [employer] [phone]).  Called Tammy and she states that per the [employer], they are unable to do a draw for patient.   I called and advised patient and she states this is OK because she thinks she knows why she hasn't been feeling well. Patient states this weekend she ran out of Trimethoprim and when she didn't take it for 2 days, her SX went away and she felt much better. When she refilled the medication she did 1/2 tab BID instead of 1 whole tab at a time, and she states her SX came back. Patient no longer wants to take this medication. Wants to know what her options are as far as discontinuing/changing this medication. Also wants to know if she can just skip having labs done and see if she feels better after medication change.

## 2019-05-07 ENCOUNTER — Encounter: Payer: Self-pay | Admitting: Physician Assistant

## 2019-05-07 ENCOUNTER — Ambulatory Visit (INDEPENDENT_AMBULATORY_CARE_PROVIDER_SITE_OTHER): Payer: Medicare Other | Admitting: Physician Assistant

## 2019-05-07 VITALS — Ht 63.0 in

## 2019-05-07 DIAGNOSIS — J432 Centrilobular emphysema: Secondary | ICD-10-CM

## 2019-05-07 DIAGNOSIS — N183 Chronic kidney disease, stage 3 unspecified: Secondary | ICD-10-CM

## 2019-05-07 DIAGNOSIS — N390 Urinary tract infection, site not specified: Secondary | ICD-10-CM

## 2019-05-07 MED ORDER — TRIMETHOPRIM 100 MG PO TABS: 50.0000 mg | ORAL_TABLET | Freq: Every day | ORAL | 1 refills | Status: DC

## 2019-05-07 NOTE — Progress Notes (Deleted)
Patient states she feels bad when taking Trimethoprim (fatigue, weakness, doesn't want to eat). She stopped medication Saturday and Sunday and felt better. She started 1/2 tablet BID Monday and felt bad again. Wants to discuss a different medication for UTI prevention.

## 2019-05-07 NOTE — Progress Notes (Signed)
Patient ID: Claire [name], female   DOB: 06-01-1951, 68 y.o.   MRN: [id number] .[name] KitchenVirtual Visit via Telephone Note  I connected with Claire [name] on 05/07/19 at  1:40 PM EDT by telephone and verified that I am speaking with the correct person using two identifiers.  Location: Patient: home Provider: home   I discussed the limitations, risks, security and privacy concerns of performing an evaluation and management service by telephone and the availability of in person appointments. I also discussed with the patient that there may be a patient responsible charge related to this service. The patient expressed understanding and agreed to proceed.   History of Present Illness:  Pt is a 68 yo female with COPD, hypothyroidism, CKD , anxiety, and recurrent UTIs who calls into the clinic to discuss medications. She had been feeling fatigue, weakness, no appetite she did not know what it could be and then ran out of trimethoprim. She started feeling better. She then started trimethoprim again and started feeling bad. She wants another medication for prevention.     .. Active Ambulatory Problems    Diagnosis Date Noted  . PTSD (post-traumatic stress disorder) 11/10/2011  . Neurosis, anxiety, panic type 11/10/2011  . Acquired hypothyroidism 11/24/2014  . BP (high blood pressure) 07/24/2012  . Hypothyroid   . Chronic use of benzodiazepine for therapeutic purpose 08/11/2015  . Hyponatremia 12/19/2016  . Hyperkalemia 12/19/2016  . Centrilobular emphysema (HCC) 12/20/2016  . Decreased hemoglobin 12/20/2016  . CKD (chronic kidney disease) stage 3, GFR 30-59 ml/min (HCC) 01/16/2017  . Iron deficiency anemia 01/16/2017  . Anemia of chronic disease 05/11/2017  . Low serum vitamin B12 05/14/2017  . Vitamin D deficiency 05/14/2017  . Depressed mood 05/14/2017  . High serum parathyroid hormone (PTH) 07/19/2017  . Normocytic anemia 07/19/2017  . DOE (dyspnea on exertion) 05/09/2018  . Recurrent UTI  05/07/2019   Resolved Ambulatory Problems    Diagnosis Date Noted  . Acute renal failure (HCC) 12/19/2016  . Acute kidney injury (HCC) 03/17/2017   Past Medical History:  Diagnosis Date  . Acid reflux   . Anxiety   . High blood pressure    Reviewed med, allergy, problem list.   Observations/Objective: No acute distress.  Normal mood.  Normal breathing.   .. Today's Vitals   05/07/19 1102  Height: 5\' 3"  (1.6 m)   Body mass index is 21.08 kg/m.    Assessment and Plan: .[name] was seen today for follow-up.  Diagnoses and all orders for this visit:  Recurrent UTI  CKD (chronic kidney disease) stage 3, GFR 30-59 ml/min (HCC)  Centrilobular emphysema (HCC)  Other orders -     trimethoprim (TRIMPEX) 100 MG tablet; Take 0.5 tablets (50 mg total) by mouth daily. For UTI prevention.  discussed options of prevention.  -estrogen cream -hiprex -urologist referral -1/2 tablet for trimethoprim and see if feels ok with symptoms and still prevents UTI.   Pt aware she cannot tolerate any of the other preventative abx treatments.  She does not want to see urologist right now.   She decided to take 50mg (1/2 tablet) daily and follow up in 1-2 months.    Follow Up Instructions:    I discussed the assessment and treatment plan with the patient. The patient was provided an opportunity to ask questions and all were answered. The patient agreed with the plan and demonstrated an understanding of the instructions.   The patient was advised to call back or seek an in-person evaluation  if the symptoms worsen or if the condition fails to improve as anticipated.  I provided 28 minutes of non-face-to-face time during this encounter.   Tandy GawJade Ozzie Knobel, PA-C

## 2019-05-15 ENCOUNTER — Ambulatory Visit (INDEPENDENT_AMBULATORY_CARE_PROVIDER_SITE_OTHER): Payer: Medicare Other | Admitting: Licensed Clinical Social Worker

## 2019-05-15 ENCOUNTER — Encounter (HOSPITAL_COMMUNITY): Payer: Self-pay | Admitting: Licensed Clinical Social Worker

## 2019-05-15 DIAGNOSIS — F411 Generalized anxiety disorder: Secondary | ICD-10-CM | POA: Diagnosis not present

## 2019-05-15 DIAGNOSIS — F4310 Post-traumatic stress disorder, unspecified: Secondary | ICD-10-CM
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
## 2019-05-27 ENCOUNTER — Encounter (HOSPITAL_COMMUNITY): Payer: Self-pay | Admitting: Licensed Clinical Social Worker

## 2019-05-27 ENCOUNTER — Ambulatory Visit (HOSPITAL_COMMUNITY): Payer: Medicare Other | Admitting: Licensed Clinical Social Worker

## 2019-05-27 ENCOUNTER — Ambulatory Visit (INDEPENDENT_AMBULATORY_CARE_PROVIDER_SITE_OTHER): Payer: Medicare Other | Admitting: Licensed Clinical Social Worker

## 2019-05-27 DIAGNOSIS — F4310 Post-traumatic stress disorder, unspecified: Secondary | ICD-10-CM | POA: Diagnosis not present

## 2019-05-27 DIAGNOSIS — F411 Generalized anxiety disorder: Secondary | ICD-10-CM
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
## 2019-05-28 ENCOUNTER — Ambulatory Visit (INDEPENDENT_AMBULATORY_CARE_PROVIDER_SITE_OTHER): Payer: Medicare Other | Admitting: Physician Assistant

## 2019-05-28 VITALS — Ht 63.0 in

## 2019-05-28 DIAGNOSIS — R252 Cramp and spasm: Secondary | ICD-10-CM

## 2019-05-28 DIAGNOSIS — D508 Other iron deficiency anemias: Secondary | ICD-10-CM

## 2019-05-28 DIAGNOSIS — E876 Hypokalemia: Secondary | ICD-10-CM

## 2019-05-28 DIAGNOSIS — R5383 Other fatigue: Secondary | ICD-10-CM | POA: Diagnosis not present

## 2019-05-28 NOTE — Progress Notes (Deleted)
Patient looking into doctors making house calls to get labs because she is worried about Potassium. Wants to discuss.

## 2019-05-29 ENCOUNTER — Encounter: Payer: Self-pay | Admitting: Physician Assistant

## 2019-05-29 DIAGNOSIS — R252 Cramp and spasm: Secondary | ICD-10-CM | POA: Insufficient documentation

## 2019-05-29 DIAGNOSIS — R5383 Other fatigue: Secondary | ICD-10-CM | POA: Insufficient documentation

## 2019-05-29 NOTE — Progress Notes (Signed)
Patient ID: Claire [name], female   DOB: Apr 16, 1951, 68 y.o.   MRN: [id number] .[name].Virtual Visit via Telephone Note  I connected with Claire [name] on 05/29/19 at  3:00 PM EDT by telephone and verified that I am speaking with the correct person using two identifiers.  Location: Patient: home Provider: clinic   I discussed the limitations, risks, security and privacy concerns of performing an evaluation and management service by telephone and the availability of in person appointments. I also discussed with the patient that there may be a patient responsible charge related to this service. The patient expressed understanding and agreed to proceed.   History of Present Illness: Pt is a 68 yo female with hypothyroidism, emphysema, CKD, hx of hyperkalemia and hyponateremia, IDA who calls in with concerns.   She really wants her labs. She is having some night cramps and concerned about her potassium. She is very nervous about coming into the office during the COVID pandemic due to her higher risk of complications. She wants to know what to do to get labs.   .. Active Ambulatory Problems    Diagnosis Date Noted  . PTSD (post-traumatic stress disorder) 11/10/2011  . Neurosis, anxiety, panic type 11/10/2011  . Acquired hypothyroidism 11/24/2014  . BP (high blood pressure) 07/24/2012  . Hypothyroid   . Chronic use of benzodiazepine for therapeutic purpose 08/11/2015  . Hyponatremia 12/19/2016  . Hyperkalemia 12/19/2016  . Centrilobular emphysema (HCC) 12/20/2016  . Decreased hemoglobin 12/20/2016  . CKD (chronic kidney disease) stage 3, GFR 30-59 ml/min (HCC) 01/16/2017  . Iron deficiency anemia 01/16/2017  . Anemia of chronic disease 05/11/2017  . Low serum vitamin B12 05/14/2017  . Vitamin D deficiency 05/14/2017  . Depressed mood 05/14/2017  . High serum parathyroid hormone (PTH) 07/19/2017  . Normocytic anemia 07/19/2017  . DOE (dyspnea on exertion) 05/09/2018  . Recurrent UTI  05/07/2019  . Leg cramps 05/29/2019  . No energy 05/29/2019   Resolved Ambulatory Problems    Diagnosis Date Noted  . Acute renal failure (HCC) 12/19/2016  . Acute kidney injury (HCC) 03/17/2017   Past Medical History:  Diagnosis Date  . Acid reflux   . Anxiety   . High blood pressure    Reviewed med, allergy, problem list.     Observations/Objective: No acute distress. Normal mood.   .. Today's Vitals   05/28/19 1340  Height: 5\' 3"  (1.6 m)   Body mass index is 21.08 kg/m.   Assessment and Plan: [name].[name].Claire [name] was seen today for advice only.  Diagnoses and all orders for this visit:  Hypokalemia -     BASIC METABOLIC PANEL WITH GFR -     Fe+TIBC+Fer -     CBC with Differential/Platelet -     Magnesium  Iron deficiency anemia secondary to inadequate dietary iron intake -     Fe+TIBC+Fer -     CBC with Differential/Platelet  No energy -     BASIC METABOLIC PANEL WITH GFR -     TSH -     B12 and Folate Panel -     Fe+TIBC+Fer -     CBC with Differential/Platelet  Leg cramps -     Magnesium   Ordered labs. Will have patient do a drive through blood draw here in office.   Follow Up Instructions:    I discussed the assessment and treatment plan with the patient. The patient was provided an opportunity to ask questions and all were answered. The patient agreed  with the plan and demonstrated an understanding of the instructions.   The patient was advised to call back or seek an in-person evaluation if the symptoms worsen or if the condition fails to improve as anticipated.  I provided 10 minutes of non-face-to-face time during this encounter.   Iran Planas, PA-C

## 2019-05-29 NOTE — Progress Notes (Addendum)
Subjective:   Claire [name] is a 68 y.o. female who presents for an Initial Medicare Annual Wellness Visit.  Review of Systems    No ROS.  Medicare Wellness Virtual Visit.  Visual/audio telehealth visit, UTA vital signs.   See social history for additional risk factors.     Cardiac Risk Factors include: none;advanced age (>[id number], 79>65 women);hypertension;dyslipidemia;sedentary lifestyle Sleep patterns: Getting 10 hours of sleep a night. Wakes up at times to void. Wakes up feeling refreshed. Home Safety/Smoke Alarms: Feels safe in home. Smoke alarms in place.  Living environment; Lives alone in a 1 story home steps outside have hand rails on them. SHower is a step over tub/shower and grab bar is in place. Seat Belt Safety/Bike Helmet: Wears seat belt.   Female:   Pap- Aged out      Mammo-  Refused     Dexa scan-  Refused      CCS- Refused      Objective:    There were no vitals filed for this visit. There is no height or weight on file to calculate BMI.  Advanced Directives 06/03/2019  Does Patient Have a Medical Advance Directive? No  Does patient want to make changes to medical advance directive? No - Patient declined  Would patient like information on creating a medical advance directive? No - Patient declined    Current Medications (verified) Outpatient Encounter Medications as of 06/03/2019  Medication Sig  . albuterol (PROVENTIL) (2.5 MG/3ML) 0.083% nebulizer solution Take 3 mLs (2.5 mg total) by nebulization every 4 (four) hours as needed for wheezing or shortness of breath.  . AMBULATORY NON FORMULARY MEDICATION Continuous stationary and portable oxygen tanks for use with ambulation. DX: COPD with hypoxia.  . AMBULATORY NON FORMULARY MEDICATION Home concentrator and one Imogen G3 portable oxygen concentrator for hypoxia.  . AMBULATORY NON FORMULARY MEDICATION Portable oxygen compressor device for portable O2 administration continuous 2L/min while walking. Dx COPD w/  hypoxia. Titrate for OCD (oxygen conserving device).  . AMBULATORY NON FORMULARY MEDICATION One pulse ox to manage SOB and O2 use.  [name]. atenolol (TENORMIN) 25 MG tablet Take 1 tablet (25 mg total) by mouth daily.  . cholecalciferol (VITAMIN D) 25 MCG (1000 UT) tablet Take 1,000 Units by mouth daily.  . clonazePAM (KLONOPIN) 0.5 MG tablet TAKE 1 TABLET BY MOUTH 3 TIMES A DAY  . DULERA 100-5 MCG/ACT AERO TAKE 2 PUFFS BY MOUTH TWICE A DAY  . famotidine (PEPCID) 20 MG tablet Take 1 tablet (20 mg total) by mouth 2 (two) times daily.  . ferrous sulfate 325 (65 FE) MG EC tablet 1 tab PO TID every other day  . levothyroxine (SYNTHROID) 75 MCG tablet TAKE 1 TABLET BY MOUTH EVERY DAY  . Magnesium 250 MG TABS Take 250 mg by mouth. Take one tablet daily  . PROAIR HFA 108 (90 Base) MCG/ACT inhaler TAKE 2 PUFFS BY MOUTH EVERY 6 HOURS AS NEEDED FOR WHEEZE OR SHORTNESS OF BREATH  . SPIRIVA RESPIMAT 2.5 MCG/ACT AERS TAKE 2 PUFFS ONCE A DAY.  [name]. trimethoprim (TRIMPEX) 100 MG tablet Take 0.5 tablets (50 mg total) by mouth daily. For UTI prevention.  . cetirizine (ZYRTEC) 10 MG chewable tablet Chew 10 mg by mouth daily.  [name]. dextromethorphan-guaiFENesin (MUCINEX DM) 30-600 MG 12hr tablet Take 1 tablet by mouth 2 (two) times daily.  . diclofenac sodium (VOLTAREN) 1 % GEL Apply 4 g topically 4 (four) times daily. To affected joint. (Patient not taking: Reported on 06/03/2019)  No facility-administered encounter medications on file as of 06/03/2019.     Allergies (verified) Bactrim [sulfamethoxazole-trimethoprim], Macrodantin [nitrofurantoin macrocrystal], Trimethoprim, and Zithromax [azithromycin]   History: Past Medical History:  Diagnosis Date  . Acid reflux   . Anxiety   . High blood pressure   . High serum parathyroid hormone (PTH) 07/19/2017   96 06/28/2014, 162 12/29/16  . Hypothyroid   . PTSD (post-traumatic stress disorder)    History reviewed. No pertinent surgical history. Family History  Problem  Relation Age of Onset  . Anxiety disorder Mother   . OCD Daughter   . Alcohol abuse Father   . Anxiety disorder Sister    Social History   Socioeconomic History  . Marital status: Divorced    Spouse name: Not on file  . Number of children: 2  . Years of education: 4414  . Highest education level: Associate degree: academic program  Occupational History  . 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 Financial resource strain: Not hard at all  . Food insecurity    Worry: Never true    Inability: Never true  . Transportation needs    Medical: No    Non-medical: No  Tobacco Use  . Smoking status: Former Smoker    Packs/day: 0.25    Years: 50.00    Pack years: 12.50    Types: Cigarettes  . Smokeless tobacco: Never Used  Substance and Sexual Activity  . Alcohol use: No  . Drug use: No  . Sexual activity: Not Currently  Lifestyle  . Physical activity    Days per week: 0 days    Minutes per session: 0 min  . Stress: Only a little  Relationships  . Social Musicianconnections    Talks on phone: More than three times a week    Gets together: Never    Attends religious service: Never    Active member of club or organization: Not on file    Attends meetings of clubs or organizations: Never    Relationship status: Divorced  Other Topics Concern  . Not on file  Social History Narrative  . Not on file    Tobacco Counseling Counseling given: Not Answered   Clinical Intake:  Pre-visit preparation completed: Yes  Pain : No/denies pain     Nutritional Risks: None Diabetes: No  How often do you need to have someone help you when you read instructions, pamphlets, or other written materials from your doctor or pharmacy?: 1 - Never What is the last grade level you completed in school?: 14  Interpreter Needed?: No  Information entered by :: Claire SicksKim Gordon, Claire [name]   Activities of Daily Living In your present state of health, do you have any difficulty  performing the following activities: 06/03/2019  Hearing? N  Vision? N  Difficulty concentrating or making decisions? N  Walking or climbing stairs? N  Dressing or bathing? N  Doing errands, shopping? N  Preparing Food and eating ? N  Using the Toilet? N  In the past six months, have you accidently leaked urine? N  Do you have problems with loss of bowel control? N  Managing your Medications? N  Managing your Finances? N  Some recent data might be hidden     Immunizations and Health Maintenance Immunization History  Administered Date(s) Administered  . Influenza,inj,Quad PF,6+ Mos 09/26/2018  . Pneumococcal Polysaccharide-23 11/05/2018  . Tdap 12/05/2010   Health Maintenance Due  Topic Date Due  .  Hepatitis C Screening  Feb 26, 1951  . COLON CANCER SCREENING ANNUAL FOBT  07/10/2018    Patient Care Team: Nolene EbbsBreeback, Jade L, PA-C as PCP - General (Family Medicine)  Indicate any recent Medical Services you may have received from other than [facility] providers in the past year (date may be approximate).     Assessment:   This is a routine wellness examination for Claire [name].Physical assessment deferred to PCP.   Hearing/Vision screen No exam data present  Dietary issues and exercise activities discussed: Current Exercise Habits: The patient does not participate in regular exercise at present, Exercise limited by: respiratory conditions(s) Diet  Eats a healthy diet of fruits. Breakfast: Granola bar or nutrigrain bar. Lunch: Corn dog and chips Dinner: Meat and vegetables. Pasta salad, corn      Goals    . Patient Stated     Would like to get in a program to help her breathing      Depression Screen PHQ 2/9 Scores 06/03/2019 09/12/2018 09/26/2017  PHQ - 2 Score 1 1 0  PHQ- 9 Score - 3 -  Some encounter information is confidential and restricted. Go to Review Flowsheets activity to see all data.    Fall Risk Fall Risk  06/03/2019  Falls in the past year? 0  Follow up Falls  prevention discussed    Is the patient's home free of loose throw rugs in walkways, pet beds, electrical cords, etc?   yes      Grab bars in the bathroom? yes      Handrails on the stairs?   yes      Adequate lighting?   yes   Cognitive Function:     6CIT Screen 06/03/2019  What Year? 0 points  What month? 0 points  What time? 0 points  Count back from 20 0 points  Months in reverse 2 points    Screening Tests Health Maintenance  Topic Date Due  . Hepatitis C Screening  Feb 26, 1951  . COLON CANCER SCREENING ANNUAL FOBT  07/10/2018  . MAMMOGRAM  08/03/2019 (Originally 01/03/2001)  . DEXA SCAN  08/03/2019 (Originally 01/04/2016)  . COLONOSCOPY  08/03/2019 (Originally 01/03/2001)  . INFLUENZA VACCINE  07/06/2019  . PNA vac Low Risk Adult (2 of 2 - PCV13) 11/06/2019  . TETANUS/TDAP  12/05/2020       Plan:      Ms. Claire CousinsSapp , Thank you for taking time to come for your Medicare Wellness Visit. I appreciate your ongoing commitment to your health goals. Please review the following plan we discussed and let me know if I can assist you in the future.  Please schedule your next medicare wellness visit with me in 1 yr. Continue doing brain stimulating activities (puzzles, reading, adult coloring books, staying active) to keep memory sharp.    These are the goals we discussed: Goals    . Patient Stated     Would like to get in a program to help her breathing       This is a list of the screening recommended for you and due dates:  Health Maintenance  Topic Date Due  .  Hepatitis C: One time screening is recommended by Center for Disease Control  (CDC) for  adults born from [birth year]1945 through 1965.   Feb 26, 1951  . Stool Blood Test  07/10/2018  . Mammogram  08/03/2019*  . DEXA scan (bone density measurement)  08/03/2019*  . Colon Cancer Screening  08/03/2019*  . Flu Shot  07/06/2019  . Pneumonia vaccines (  2 of 2 - PCV13) 11/06/2019  . Tetanus Vaccine  12/05/2020  *Topic was postponed.  The date shown is not the original due date.     I have personally reviewed and noted the following in the patient's chart:   . Medical and social history . Use of alcohol, tobacco or illicit drugs  . Current medications and supplements . Functional ability and status . Nutritional status . Physical activity . Advanced directives . List of other physicians . Hospitalizations, surgeries, and ER visits in previous 12 months . Vitals . Screenings to include cognitive, depression, and falls . Referrals and appointments  In addition, I have reviewed and discussed with patient certain preventive protocols, quality metrics, and best practice recommendations. A written personalized care plan for preventive services as well as general preventive health recommendations were provided to patient.     Claire Chars, Claire [name]   1[birth year]30    .[name] KitchenMedical screening examination/treatment was performed by qualified clinical staff member and as supervising physician I was immediately available for consultation/collaboration. I have reviewed documentation and agree with assessment and plan.  Iran Planas, PA-C  .[name] KitchenMedical screening examination/treatment was performed by qualified clinical staff member and as supervising physician I was immediately available for consultation/collaboration. I have reviewed documentation and agree with assessment and plan.  Iran Planas, PA-C

## 2019-05-30 ENCOUNTER — Ambulatory Visit (INDEPENDENT_AMBULATORY_CARE_PROVIDER_SITE_OTHER): Payer: Medicare Other | Admitting: Physician Assistant

## 2019-05-30 DIAGNOSIS — E871 Hypo-osmolality and hyponatremia: Secondary | ICD-10-CM

## 2019-05-30 DIAGNOSIS — E875 Hyperkalemia: Secondary | ICD-10-CM | POA: Diagnosis not present

## 2019-05-30 DIAGNOSIS — N183 Chronic kidney disease, stage 3 unspecified: Secondary | ICD-10-CM

## 2019-05-30 DIAGNOSIS — D638 Anemia in other chronic diseases classified elsewhere: Secondary | ICD-10-CM

## 2019-05-30 DIAGNOSIS — E559 Vitamin D deficiency, unspecified: Secondary | ICD-10-CM

## 2019-05-30 DIAGNOSIS — R252 Cramp and spasm: Secondary | ICD-10-CM

## 2019-05-30 DIAGNOSIS — E538 Deficiency of other specified B group vitamins: Secondary | ICD-10-CM

## 2019-05-30 DIAGNOSIS — E039 Hypothyroidism, unspecified: Secondary | ICD-10-CM

## 2019-05-30 DIAGNOSIS — D509 Iron deficiency anemia, unspecified: Secondary | ICD-10-CM

## 2019-05-30 NOTE — Progress Notes (Signed)
  Subjective:     Patient ID: Claire [name], female   DOB: 09/18/1951, 68 y.o.   MRN: [id number]  HPI Patient here to have lab draw done. Labs drawn outside in drive up. Blood work taken to [facility] to spin. KG LPN   Review of Systems     Objective:   Physical Exam     Assessment:        Labs drawn for blood work. KG LPN Plan:     Will call patient with results. Iran Planas PAC.

## 2019-05-31 LAB — MAGNESIUM: Magnesium: 1.7 mg/dL (ref 1.5–2.5)

## 2019-05-31 NOTE — Progress Notes (Signed)
Call pt: magnesium normal range but low normal could take supplement magnesium 250mg  daily.   Sodium is a little decreased. Need to increase sodium in diet. This will give you more energy.   Kidney function is up a little bit from 5 months ago. Recheck in 1 month to see if trending up or down.   Potassium is perfect. Thyroid is perfect.   You have plenty of iron stores. You could even back off your iron supplement right now. Whatever you are taking cut in half.   b12 normal range but lower normal. Start b12 1073mcg sublingual.

## 2019-06-02 LAB — BASIC METABOLIC PANEL WITH GFR
BUN/Creatinine Ratio: 15 (calc) (ref 6–22)
BUN: 19 mg/dL (ref 7–25)
CO2: 25 mmol/L (ref 20–32)
Calcium: 9.7 mg/dL (ref 8.6–10.4)
Chloride: 94 mmol/L — ABNORMAL LOW (ref 98–110)
Creat: 1.3 mg/dL — ABNORMAL HIGH (ref 0.50–0.99)
GFR, Est African American: 49 mL/min/{1.73_m2} — ABNORMAL LOW (ref >=60)
GFR, Est Non African American: 42 mL/min/{1.73_m2} — ABNORMAL LOW (ref >=60)
Glucose, Bld: 98 mg/dL (ref 65–99)
Potassium: 4.5 mmol/L (ref 3.5–5.3)
Sodium: 132 mmol/L — ABNORMAL LOW (ref 135–146)

## 2019-06-02 LAB — TSH: TSH: 1.56 mIU/L (ref 0.40–4.50)

## 2019-06-02 LAB — IRON,TIBC AND FERRITIN PANEL
%SAT: 30 % (calc) (ref 16–45)
Ferritin: 296 ng/mL — ABNORMAL HIGH (ref 16–288)
Iron: 69 ug/dL (ref 45–160)
TIBC: 228 mcg/dL (calc) — ABNORMAL LOW (ref 250–450)

## 2019-06-02 LAB — B12 AND FOLATE PANEL
Folate: 10.7 ng/mL
Vitamin B-12: 357 pg/mL (ref 200–1100)

## 2019-06-02 LAB — CBC WITH DIFFERENTIAL/PLATELET

## 2019-06-03 ENCOUNTER — Ambulatory Visit (INDEPENDENT_AMBULATORY_CARE_PROVIDER_SITE_OTHER): Payer: Medicare Other | Admitting: *Deleted

## 2019-06-03 DIAGNOSIS — Z0000 Encounter for general adult medical examination without abnormal findings: Secondary | ICD-10-CM

## 2019-06-03 NOTE — Patient Instructions (Signed)
Claire [name] , Thank you for taking time to come for your Medicare Wellness Visit. I appreciate your ongoing commitment to your health goals. Please review the following plan we discussed and let me know if I can assist you in the future.  Please schedule your next medicare wellness visit with me in 1 yr. Continue doing brain stimulating activities (puzzles, reading, adult coloring books, staying active) to keep memory sharp.   These are the goals we discussed: Goals    . Patient Stated     Would like to get in a program to help her breathing

## 2019-06-03 NOTE — Progress Notes (Signed)
Do not see where patient has been called with labs.

## 2019-06-04 ENCOUNTER — Telehealth: Payer: Self-pay | Admitting: Neurology

## 2019-06-04 ENCOUNTER — Ambulatory Visit (INDEPENDENT_AMBULATORY_CARE_PROVIDER_SITE_OTHER): Payer: Medicare Other | Admitting: Licensed Clinical Social Worker

## 2019-06-04 DIAGNOSIS — F4310 Post-traumatic stress disorder, unspecified: Secondary | ICD-10-CM

## 2019-06-04 DIAGNOSIS — F411 Generalized anxiety disorder: Secondary | ICD-10-CM | POA: Diagnosis not present

## 2019-06-04 MED ORDER — ESTRADIOL 0.1 MG/GM VA CREA: TOPICAL_CREAM | VAGINAL | 1 refills | Status: DC
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
## 2019-06-04 NOTE — Telephone Encounter (Signed)
We can try:   -estrogen cream -hiprex -urologist referral

## 2019-06-04 NOTE — Telephone Encounter (Signed)
Patient left vm that she didn't want to take the medication.   I called and spoke with her. She states she no longer wants to take Trimethoprim. She has tracked how she feels, even taking the half tablet, and it just makes her feel bad no matter what time of day she takes it. Please advise on alternative.

## 2019-06-04 NOTE — Telephone Encounter (Signed)
Done

## 2019-06-04 NOTE — Telephone Encounter (Signed)
Patient given options. She wants to try the estrogen cream. Please sent to pharmacy.

## 2019-06-05 NOTE — Progress Notes (Signed)
Maudie Mercury make sure you are noting consent for tele health visits. Can you please addend this.

## 2019-06-14 ENCOUNTER — Other Ambulatory Visit: Payer: Self-pay | Admitting: Physician Assistant

## 2019-06-18 ENCOUNTER — Ambulatory Visit (INDEPENDENT_AMBULATORY_CARE_PROVIDER_SITE_OTHER): Payer: Medicare Other | Admitting: Licensed Clinical Social Worker

## 2019-06-18 DIAGNOSIS — F4310 Post-traumatic stress disorder, unspecified: Secondary | ICD-10-CM | POA: Diagnosis not present

## 2019-06-18 DIAGNOSIS — F411 Generalized anxiety disorder: Secondary | ICD-10-CM | POA: Diagnosis not present

## 2019-06-20 ENCOUNTER — Encounter (HOSPITAL_COMMUNITY): Payer: Self-pay | Admitting: Licensed Clinical Social Worker

## 2019-06-20 ENCOUNTER — Encounter (HOSPITAL_COMMUNITY): Payer: Self-pay | Admitting: Psychiatry

## 2019-06-20 ENCOUNTER — Other Ambulatory Visit: Payer: Self-pay

## 2019-06-20 ENCOUNTER — Ambulatory Visit (INDEPENDENT_AMBULATORY_CARE_PROVIDER_SITE_OTHER): Payer: Medicare Other | Admitting: Psychiatry

## 2019-06-20 DIAGNOSIS — F410 Panic disorder [episodic paroxysmal anxiety] without agoraphobia: Secondary | ICD-10-CM | POA: Diagnosis not present

## 2019-06-20 DIAGNOSIS — F411 Generalized anxiety disorder: Secondary | ICD-10-CM

## 2019-06-20 DIAGNOSIS — F4310 Post-traumatic stress disorder, unspecified: Secondary | ICD-10-CM | POA: Diagnosis not present

## 2019-06-20 MED ORDER — CLONAZEPAM 0.5 MG PO TABS: ORAL_TABLET | ORAL | 2 refills | Status: DC

## 2019-06-20 NOTE — Progress Notes (Signed)
Patient ID: Claire [name], female   DOB: Jul 17, 1951, 68 y.o.   MRN: [id number]  [facility] Amyla L Chesney [id number] 68 y.o.  06/20/2019 4:10 PM  Chief Complaint:  Anxiety follow up  History of Present Illness:   I connected with Claire [name] on 06/20/19 at  4:00 PM EDT by telephone and verified that I am speaking with the correct person using two identifiers.    I discussed the limitations, risks, security and privacy concerns of performing an evaluation and management service by telephone and the availability of in person appointments. I also discussed with the patient that there may be a patient responsible charge related to this service. The patient expressed understanding and agreed to proceed.  Doing fair, not living with daughter so less stress and arguments Does not want to cut down klonopine, Understands the risk of forgetfullness but says memory is good Has started therapy and is helping with past and current dynamics of anxiety Modifying factor: breahting techniques. grandkids   PTSD is related to her past some flashbacks. Distraction helps Depression not worse No side effects     Aggravating factors:daughter  No psychosis  Medical History; Past Medical History:  Diagnosis Date  . Acid reflux   . Anxiety   . High blood pressure   . High serum parathyroid hormone (PTH) 07/19/2017   96 06/28/2014, 162 12/29/16  . Hypothyroid   . PTSD (post-traumatic stress disorder)     Allergies: Allergies  Allergen Reactions  . Bactrim [Sulfamethoxazole-Trimethoprim]     ACUTE RENAL fAILURE/HYPONATREMIA/HYPERKALEMIA  . Macrodantin [Nitrofurantoin Macrocrystal] Rash  . Trimethoprim     Fatigue/weakness/no appetite  . Zithromax [Azithromycin]     diarrhea    Medications: Outpatient Encounter Medications as of 06/20/2019  Medication Sig  . albuterol (PROVENTIL) (2.5 MG/3ML) 0.083% nebulizer solution Take 3 mLs (2.5  mg total) by nebulization every 4 (four) hours as needed for wheezing or shortness of breath.  [name]. albuterol (VENTOLIN HFA) 108 (90 Base) MCG/ACT inhaler TAKE 2 PUFFS BY MOUTH EVERY 6 HOURS AS NEEDED FOR WHEEZE OR SHORTNESS OF BREATH  . AMBULATORY NON FORMULARY MEDICATION Continuous stationary and portable oxygen tanks for use with ambulation. DX: COPD with hypoxia.  . AMBULATORY NON FORMULARY MEDICATION Home concentrator and one Imogen G3 portable oxygen concentrator for hypoxia.  . AMBULATORY NON FORMULARY MEDICATION Portable oxygen compressor device for portable O2 administration continuous 2L/min while walking. Dx COPD w/ hypoxia. Titrate for OCD (oxygen conserving device).  . AMBULATORY NON FORMULARY MEDICATION One pulse ox to manage SOB and O2 use.  [name]. atenolol (TENORMIN) 25 MG tablet Take 1 tablet (25 mg total) by mouth daily.  . cetirizine (ZYRTEC) 10 MG chewable tablet Chew 10 mg by mouth daily.  . cholecalciferol (VITAMIN D) 25 MCG (1000 UT) tablet Take 1,000 Units by mouth daily.  . clonazePAM (KLONOPIN) 0.5 MG tablet TAKE 1 TABLET BY MOUTH 3 TIMES A DAY  . dextromethorphan-guaiFENesin (MUCINEX DM) 30-600 MG 12hr tablet Take 1 tablet by mouth 2 (two) times daily.  . diclofenac sodium (VOLTAREN) 1 % GEL Apply 4 g topically 4 (four) times daily. To affected joint. (Patient not taking: Reported on 06/03/2019)  . DULERA 100-5 MCG/ACT AERO TAKE 2 PUFFS BY MOUTH TWICE A DAY  . estradiol (ESTRACE VAGINAL) 0.1 MG/GM vaginal cream Spread a pea-sized amount around the urethra opening three times a week.  . famotidine (PEPCID) 20 MG tablet Take 1 tablet (20 mg total)  by mouth 2 (two) times daily.  . ferrous sulfate 325 (65 FE) MG EC tablet 1 tab PO TID every other day  . levothyroxine (SYNTHROID) 75 MCG tablet TAKE 1 TABLET BY MOUTH EVERY DAY  . Magnesium 250 MG TABS Take 250 mg by mouth. Take one tablet daily  . SPIRIVA RESPIMAT 2.5 MCG/ACT AERS TAKE 2 PUFFS ONCE A DAY.  [name] trimethoprim (TRIMPEX) 100  MG tablet Take 0.5 tablets (50 mg total) by mouth daily. For UTI prevention.  . [DISCONTINUED] clonazePAM (KLONOPIN) 0.5 MG tablet TAKE 1 TABLET BY MOUTH 3 TIMES A DAY   No facility-administered encounter medications on file as of 06/20/2019.     Family History; Family History  Problem Relation Age of Onset  . Anxiety disorder Mother   . OCD Daughter   . Alcohol abuse Father   . Anxiety disorder Sister        Labs:  Recent Results (from the past 2160 hour(s))  BASIC METABOLIC PANEL WITH GFR     Status: Abnormal   Collection Time: 05/28/19  3:27 PM  Result Value Ref Range   Glucose, Bld 98 65 - 99 mg/dL    Comment: .            Fasting reference interval .    BUN 19 7 - 25 mg/dL   Creat 1.30 (H) 0.50 - 0.99 mg/dL    Comment: For patients >14 years of age, the reference limit for Creatinine is approximately 13% higher for people identified as African-American. .    GFR, Est Non African American 42 (L) > OR = 60 mL/min/1.26m2   GFR, Est African American 49 (L) > OR = 60 mL/min/1.34m2   BUN/Creatinine Ratio 15 6 - 22 (calc)   Sodium 132 (L) 135 - 146 mmol/L   Potassium 4.5 3.5 - 5.3 mmol/L   Chloride 94 (L) 98 - 110 mmol/L   CO2 25 20 - 32 mmol/L   Calcium 9.7 8.6 - 10.4 mg/dL  TSH     Status: None   Collection Time: 05/28/19  3:27 PM  Result Value Ref Range   TSH 1.56 0.40 - 4.50 mIU/L  B12 and Folate Panel     Status: None   Collection Time: 05/28/19  3:27 PM  Result Value Ref Range   Vitamin B-12 357 200 - 1,100 pg/mL    Comment: . Please Note: Although the reference range for vitamin B12 is [phone] pg/mL, it has been reported that between 5 and 10% of patients with values between 200 and 400 pg/mL may experience neuropsychiatric and hematologic abnormalities due to occult B12 deficiency; less than 1% of patients with values above 400 pg/mL will have symptoms. .    Folate 10.7 ng/mL    Comment:                            Reference Range                             Low:           <3.4                            Borderline:    3.4-5.4  Normal:        >5.4 .   Fe+TIBC+Fer     Status: Abnormal   Collection Time: 05/28/19  3:27 PM  Result Value Ref Range   Iron 69 45 - 160 mcg/dL   TIBC 295228 (L) 621250 - 308450 mcg/dL (calc)   %SAT 30 16 - 45 % (calc)   Ferritin 296 (H) 16 - 288 ng/mL  CBC with Differential/Platelet     Status: None   Collection Time: 05/28/19  3:27 PM  Result Value Ref Range   WBC CANCELED     Comment: TEST NOT PERFORMED . No lavender-top tube received.  Result canceled by the ancillary.   Magnesium     Status: None   Collection Time: 05/28/19  3:29 PM  Result Value Ref Range   Magnesium 1.7 1.5 - 2.5 mg/dL      Mental Status Examination;   Psychiatric Specialty Exam: Physical Exam  Review of Systems  Cardiovascular: Negative for chest pain and palpitations.  Skin: Negative for itching.  Psychiatric/Behavioral: Negative for depression and suicidal ideas.    There were no vitals taken for this visit.There is no height or weight on file to calculate BMI.  General Appearance:   Eye Contact::    Speech:  Slow  Volume:  Decreased  Mood: fair  Affect:  congruent  Thought Process:  Coherent and intact  Orientation:  Full (Time, Place, and Person)  Thought Content:  Rumination  Suicidal Thoughts:  No  Homicidal Thoughts:  No  Memory:  Immediate;   Fair Recent;   Fair  Judgement:  Fair  Insight:  Shallow  Psychomotor Activity:  Normal  Concentration:  Fair  Recall:  Fair  Akathisia:  Negative  Handed:  Right  AIMS (if indicated):     Assets:  Social Support Vocational/Educational  Sleep:        Assessment: Axis I: Panic disorder. Possible PTSD. Rule out generalized anxiety disorder. Nicotine dependence  Axis II: Deferred  Axis III:  Past Medical History:  Diagnosis Date  . Acid reflux   . Anxiety   . High blood pressure   . High serum parathyroid hormone (PTH)  07/19/2017   96 06/28/2014, 162 12/29/16  . Hypothyroid   . PTSD (post-traumatic stress disorder)     Axis IV: Psychosocial. History of trauma   Treatment Plan and Summary:  Anxiety and GAD: doing fair. Caution about dose but she feels stable and does not want to cut down klonopine  PTSD: baseline. continuie disraction techniques Does not want to be on sSRi  But just klonopine    I discussed the assessment and treatment plan with the patient. The patient was provided an opportunity to ask questions and all were answered. The patient agreed with the plan and demonstrated an understanding of the instructions.   The patient was advised to call back or seek an in-person evaluation if the symptoms worsen or if the condition fails to improve as anticipated.  I provided 15 minutes of non-face-to-face time during this encounter. Fu 7150m.   Thresa RossNadeem Maral Lampe, MD 06/20/2019

## 2019-06-20 NOTE — Progress Notes (Signed)
Virtual Visit via Telephone Note  I connected with Claire [name] on 06/20/19 at  2:30 PM EDT by telephone and verified that I am speaking with the correct person using two identifiers.  Location: Patient: Home Provider: Office   I discussed the limitations, risks, security and privacy concerns of performing an evaluation and management service by telephone and the availability of in person appointments. I also discussed with the patient that there may be a patient responsible charge related to this service. The patient expressed understanding and agreed to proceed.       I discussed the assessment and treatment plan with the patient. The patient was provided an opportunity to ask questions and all were answered. The patient agreed with the plan and demonstrated an understanding of the instructions.   The patient was advised to call back or seek an in-person evaluation if the symptoms worsen or if the condition fails to improve as anticipated.  I provided 52 minutes of non-face-to-face time during this encounter.   Archie Balboa, LCAS-A    THERAPIST PROGRESS NOTE  Session Time: 2:30-3:30  Participation Level: Active  Behavioral Response: NAAlertEuthymic  Type of Therapy: Individual Therapy  Treatment Goals addressed: Anxiety  Interventions: CBT  Summary: Claire [name] is a 68 y.o. female who presents with HX of anxiety and PTSD. She is engaged, talkative, and euthymic in our telephone session today. She reports she is concerned about her son's upcoming marriage to his girlfriend of 4 years. We discuss the relationship b/w PT and her son and how she is highly guarded of him given her traumatic background.    Suicidal/Homicidal: Nowithout intent/plan  Therapist Response: I used open Socractic questions to help PT challenge her automatic negative thoughts and core beliefs. I worked to reflect thoughts and emotions of PT that would allow her to deepen her understanding of her  communication w/ her son.   Plan: Return again in 2 weeks.  Diagnosis:    ICD-10-CM   1. GAD (generalized anxiety disorder)  F41.1   2. PTSD (post-traumatic stress disorder)  F43.10      Archie Balboa, LCAS-A 06/20/2019

## 2019-06-24 ENCOUNTER — Other Ambulatory Visit: Payer: Self-pay | Admitting: Physician Assistant

## 2019-06-24 DIAGNOSIS — I10 Essential (primary) hypertension: Secondary | ICD-10-CM

## 2019-06-25 ENCOUNTER — Telehealth: Payer: Self-pay

## 2019-06-25 DIAGNOSIS — N183 Chronic kidney disease, stage 3 unspecified: Secondary | ICD-10-CM

## 2019-06-25 NOTE — Telephone Encounter (Signed)
Patient called and stated she is needing to have 1 month repeat blood draw.  Patient scheduled for drive-thru blood draw.   Order pended, please advise if any other labs need to be added

## 2019-06-25 NOTE — Telephone Encounter (Signed)
I changed to bmp but other than that sounds great.

## 2019-06-27 ENCOUNTER — Encounter: Payer: Self-pay | Admitting: Physician Assistant

## 2019-06-27 ENCOUNTER — Ambulatory Visit (INDEPENDENT_AMBULATORY_CARE_PROVIDER_SITE_OTHER): Payer: Medicare Other | Admitting: Physician Assistant

## 2019-06-27 VITALS — Ht 63.0 in

## 2019-06-27 DIAGNOSIS — J3489 Other specified disorders of nose and nasal sinuses: Secondary | ICD-10-CM | POA: Diagnosis not present

## 2019-06-27 DIAGNOSIS — J441 Chronic obstructive pulmonary disease with (acute) exacerbation: Secondary | ICD-10-CM | POA: Diagnosis not present

## 2019-06-27 DIAGNOSIS — R0981 Nasal congestion: Secondary | ICD-10-CM | POA: Diagnosis not present

## 2019-06-27 MED ORDER — AMOXICILLIN-POT CLAVULANATE 875-125 MG PO TABS: 1.0000 | ORAL_TABLET | Freq: Two times a day (BID) | ORAL | 0 refills | Status: DC

## 2019-06-27 NOTE — Progress Notes (Signed)
Patient ID: Claire [name], female   DOB: 10/17/1951, 68 y.o.   MRN: [id number] .[name] KitchenVirtual Visit via Telephone Note  I connected with Claire [name] on 06/27/19 at  1:00 PM EDT by telephone and verified that I am speaking with the correct person using two identifiers.  Location: Patient: home Provider: clinic   I discussed the limitations, risks, security and privacy concerns of performing an evaluation and management service by telephone and the availability of in person appointments. I also discussed with the patient that there may be a patient responsible charge related to this service. The patient expressed understanding and agreed to proceed.   History of Present Illness: Pt is a 68 yo female with COPD who calls into clinic with sinus drainage, nasal congestion for about one week. She has SOB all the time but maybe a little worse than usually. She is finding it harder to complete all of her task. No fever, chills, headache. She has her left nostril that is congested and she is coughing up white/clear sputum more often. She has done a few sinus rinses. She is taking her daily inhalers and mucinex every day.   .. Active Ambulatory Problems    Diagnosis Date Noted  . PTSD (post-traumatic stress disorder) 11/10/2011  . Neurosis, anxiety, panic type 11/10/2011  . Acquired hypothyroidism 11/24/2014  . BP (high blood pressure) 07/24/2012  . Hypothyroid   . Chronic use of benzodiazepine for therapeutic purpose 08/11/2015  . Hyponatremia 12/19/2016  . Hyperkalemia 12/19/2016  . Centrilobular emphysema ([place]) 12/20/2016  . Decreased hemoglobin 12/20/2016  . CKD (chronic kidney disease) stage 3, GFR 30-59 ml/min (HCC) 01/16/2017  . Iron deficiency anemia 01/16/2017  . Anemia of chronic disease 05/11/2017  . Low serum vitamin B12 05/14/2017  . Vitamin D deficiency 05/14/2017  . Depressed mood 05/14/2017  . High serum parathyroid hormone (PTH) 07/19/2017  . Normocytic anemia 07/19/2017  . DOE  (dyspnea on exertion) 05/09/2018  . Recurrent UTI 05/07/2019  . Leg cramps 05/29/2019  . No energy 05/29/2019   Resolved Ambulatory Problems    Diagnosis Date Noted  . Acute renal failure ([place]) 12/19/2016  . Acute kidney injury ([place]) 03/17/2017   Past Medical History:  Diagnosis Date  . Acid reflux   . Anxiety   . High blood pressure    Reviewed med, allergy, problem list.     Observations/Objective: No acute distress.  Normal breathing. Productive cough heard over the phone.  Normal mood.   No vitals obtained.    Assessment and Plan: [name] KitchenMarland KitchenJeanette was seen today for sinus problem.  Diagnoses and all orders for this visit:  COPD exacerbation ([place])  Nasal congestion  Sinus pressure  Other orders -     Discontinue: amoxicillin-clavulanate (AUGMENTIN) 875-125 MG tablet; Take 1 tablet by mouth 2 (two) times daily. -     amoxicillin-clavulanate (AUGMENTIN) 875-125 MG tablet; Take 1 tablet by mouth 2 (two) times daily.   Pt reports she has prednisone at her house. I would take 50mg  once a day for 5 days. If not improved can start antibiotic for a sinus infection. I think more inflammation of nares and sinuses. Pt would benefit from flonase but she states it makes her nose too dry.    Follow Up Instructions:    I discussed the assessment and treatment plan with the patient. The patient was provided an opportunity to ask questions and all were answered. The patient agreed with the plan and demonstrated an understanding of the instructions.  The patient was advised to call back or seek an in-person evaluation if the symptoms worsen or if the condition fails to improve as anticipated.  I provided 25 minutes of non-face-to-face time during this encounter.   Tandy Gaw, PA-C

## 2019-06-28 ENCOUNTER — Ambulatory Visit: Payer: Medicare Other

## 2019-07-01 ENCOUNTER — Other Ambulatory Visit: Payer: Self-pay | Admitting: Physician Assistant

## 2019-07-05 ENCOUNTER — Ambulatory Visit: Payer: Medicare Other

## 2019-07-08 ENCOUNTER — Ambulatory Visit (INDEPENDENT_AMBULATORY_CARE_PROVIDER_SITE_OTHER): Payer: Medicare Other | Admitting: Licensed Clinical Social Worker

## 2019-07-08 ENCOUNTER — Encounter (HOSPITAL_COMMUNITY): Payer: Self-pay | Admitting: Licensed Clinical Social Worker

## 2019-07-08 DIAGNOSIS — F411 Generalized anxiety disorder: Secondary | ICD-10-CM

## 2019-07-08 DIAGNOSIS — F4310 Post-traumatic stress disorder, unspecified: Secondary | ICD-10-CM
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
## 2019-07-10 ENCOUNTER — Ambulatory Visit: Payer: Medicare Other

## 2019-07-12 ENCOUNTER — Other Ambulatory Visit: Payer: Self-pay | Admitting: Physician Assistant

## 2019-07-12 DIAGNOSIS — J432 Centrilobular emphysema: Secondary | ICD-10-CM

## 2019-07-13 ENCOUNTER — Other Ambulatory Visit: Payer: Self-pay | Admitting: Physician Assistant

## 2019-07-13 DIAGNOSIS — J432 Centrilobular emphysema: Secondary | ICD-10-CM

## 2019-07-15 ENCOUNTER — Ambulatory Visit: Payer: Medicare Other

## 2019-07-16 ENCOUNTER — Telehealth: Payer: Self-pay | Admitting: Family Medicine

## 2019-07-16 NOTE — Telephone Encounter (Signed)
I was placing some immunizations in the refrigerator and noticed some blood in a tube that was left in the bottom drawer. I placed the blood in the refrigerator in Urgent Care. Patient is aware and she is coming back for a blood draw and she was not upset about the situation.

## 2019-07-16 NOTE — Telephone Encounter (Signed)
This has already been picked up by [facility] today.

## 2019-07-16 NOTE — Telephone Encounter (Signed)
We need to get the labs back from Urgent Care. It has been too many days for them to run the lab.

## 2019-07-17 ENCOUNTER — Ambulatory Visit (INDEPENDENT_AMBULATORY_CARE_PROVIDER_SITE_OTHER): Payer: Medicare Other | Admitting: Osteopathic Medicine

## 2019-07-17 ENCOUNTER — Other Ambulatory Visit: Payer: Self-pay

## 2019-07-17 DIAGNOSIS — N183 Chronic kidney disease, stage 3 unspecified: Secondary | ICD-10-CM

## 2019-07-17 LAB — BASIC METABOLIC PANEL WITH GFR
BUN/Creatinine Ratio: 17 (calc) (ref 6–22)
BUN: 21 mg/dL (ref 7–25)
CO2: 28 mmol/L (ref 20–32)
Calcium: 9.7 mg/dL (ref 8.6–10.4)
Chloride: 93 mmol/L — ABNORMAL LOW (ref 98–110)
Creat: 1.23 mg/dL — ABNORMAL HIGH (ref 0.50–0.99)
GFR, Est African American: 52 mL/min/{1.73_m2} — ABNORMAL LOW (ref >=60)
GFR, Est Non African American: 45 mL/min/{1.73_m2} — ABNORMAL LOW (ref >=60)
Glucose, Bld: 96 mg/dL (ref 65–99)
Potassium: 5.2 mmol/L (ref 3.5–5.3)
Sodium: 130 mmol/L — ABNORMAL LOW (ref 135–146)

## 2019-07-17 NOTE — Telephone Encounter (Signed)
Call pt: kidney function has improved. Sodium still low. Increase some salt in diet.

## 2019-07-17 NOTE — Progress Notes (Signed)
Blood drawn without complications. Spun and placed in lab.

## 2019-07-18 ENCOUNTER — Telehealth: Payer: Self-pay

## 2019-07-18 DIAGNOSIS — J432 Centrilobular emphysema: Secondary | ICD-10-CM

## 2019-07-18 LAB — BASIC METABOLIC PANEL WITH GFR
BUN/Creatinine Ratio: 17 (calc) (ref 6–22)
BUN: 21 mg/dL (ref 7–25)
CO2: 29 mmol/L (ref 20–32)
Calcium: 9.9 mg/dL (ref 8.6–10.4)
Chloride: 92 mmol/L — ABNORMAL LOW (ref 98–110)
Creat: 1.21 mg/dL — ABNORMAL HIGH (ref 0.50–0.99)
GFR, Est African American: 53 mL/min/{1.73_m2} — ABNORMAL LOW (ref >=60)
GFR, Est Non African American: 46 mL/min/{1.73_m2} — ABNORMAL LOW (ref >=60)
Glucose, Bld: 79 mg/dL (ref 65–99)
Potassium: 4.8 mmol/L (ref 3.5–5.3)
Sodium: 129 mmol/L — ABNORMAL LOW (ref 135–146)

## 2019-07-18 NOTE — Telephone Encounter (Signed)
Patient wanting to know if you will review her lab results.   Thanks!

## 2019-07-19 MED ORDER — DULERA 100-5 MCG/ACT IN AERO: INHALATION_SPRAY | RESPIRATORY_TRACT | 3 refills | Status: DC

## 2019-07-19 NOTE — Progress Notes (Signed)
This encounter was created in error - please disregard.

## 2019-07-19 NOTE — Telephone Encounter (Signed)
Pt advised. RF for [facility] sent per pt request

## 2019-07-19 NOTE — Telephone Encounter (Signed)
Labs look effectively not much change from about 3 weeks ago.  This is stable. Recommend follow-up with PCP in near future.

## 2019-07-22 ENCOUNTER — Ambulatory Visit (INDEPENDENT_AMBULATORY_CARE_PROVIDER_SITE_OTHER): Payer: Medicare Other | Admitting: Licensed Clinical Social Worker

## 2019-07-22 ENCOUNTER — Encounter (HOSPITAL_COMMUNITY): Payer: Self-pay | Admitting: Licensed Clinical Social Worker

## 2019-07-22 DIAGNOSIS — F4310 Post-traumatic stress disorder, unspecified: Secondary | ICD-10-CM | POA: Diagnosis not present

## 2019-07-22 DIAGNOSIS — F411 Generalized anxiety disorder: Secondary | ICD-10-CM | POA: Diagnosis not present

## 2019-07-22 NOTE — Progress Notes (Signed)
Kidney function stable but sodium continues to decrease. Will discuss on telephone tomorrow.
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
## 2019-07-23 ENCOUNTER — Ambulatory Visit (INDEPENDENT_AMBULATORY_CARE_PROVIDER_SITE_OTHER): Payer: Medicare Other | Admitting: Physician Assistant

## 2019-07-23 VITALS — Ht 63.0 in

## 2019-07-23 DIAGNOSIS — E871 Hypo-osmolality and hyponatremia: Secondary | ICD-10-CM | POA: Diagnosis not present

## 2019-07-23 DIAGNOSIS — N390 Urinary tract infection, site not specified: Secondary | ICD-10-CM | POA: Diagnosis not present

## 2019-07-23 DIAGNOSIS — N183 Chronic kidney disease, stage 3 unspecified: Secondary | ICD-10-CM

## 2019-07-23 MED ORDER — AMOXICILLIN-POT CLAVULANATE 500-125 MG PO TABS: 1.0000 | ORAL_TABLET | Freq: Two times a day (BID) | ORAL | 0 refills | Status: DC

## 2019-07-23 NOTE — Progress Notes (Signed)
l °

## 2019-07-23 NOTE — Progress Notes (Signed)
Patient ID: Claire [name], female   DOB: 07/26/1951, 68 y.o.   MRN: [id number] .[name] KitchenVirtual Visit via Telephone Note  I connected with Claire [name] on 07/24/19 at  3:00 PM EDT by telephone and verified that I am speaking with the correct person using two identifiers.  Location: Patient: home Provider: clinic   I discussed the limitations, risks, security and privacy concerns of performing an evaluation and management service by telephone and the availability of in person appointments. I also discussed with the patient that there may be a patient responsible charge related to this service. The patient expressed understanding and agreed to proceed.   History of Present Illness: Pt is a 68 yo female with COPD, CKD, anxiety, recurrent UTI's who calls into the clinic to discuss labs.   She noticed her sodium was low. She has started to drink pickle juice. She has ongoing muscle cramps. Denies any lethargy/tremor. She persistently struggles with no energy. She denies any swelling/headaches.   She has had 4 days of UTI symptoms. She has hx of recurrent UTIs. She has had urine odor/cloudiness/dysuria/increase in frequency and urgency. She was not able to tolerate preventative uTI treatment.   .. Active Ambulatory Problems    Diagnosis Date Noted  . PTSD (post-traumatic stress disorder) 11/10/2011  . Neurosis, anxiety, panic type 11/10/2011  . Acquired hypothyroidism 11/24/2014  . BP (high blood pressure) 07/24/2012  . Hypothyroid   . Chronic use of benzodiazepine for therapeutic purpose 08/11/2015  . Hyponatremia 12/19/2016  . Hyperkalemia 12/19/2016  . Centrilobular emphysema ([place]) 12/20/2016  . Decreased hemoglobin 12/20/2016  . CKD (chronic kidney disease) stage 3, GFR 30-59 ml/min (HCC) 01/16/2017  . Iron deficiency anemia 01/16/2017  . Anemia of chronic disease 05/11/2017  . Low serum vitamin B12 05/14/2017  . Vitamin D deficiency 05/14/2017  . Depressed mood 05/14/2017  . High serum  parathyroid hormone (PTH) 07/19/2017  . Normocytic anemia 07/19/2017  . DOE (dyspnea on exertion) 05/09/2018  . Recurrent UTI 05/07/2019  . Leg cramps 05/29/2019  . No energy 05/29/2019   Resolved Ambulatory Problems    Diagnosis Date Noted  . Acute renal failure ([place]) 12/19/2016  . Acute kidney injury ([place]) 03/17/2017   Past Medical History:  Diagnosis Date  . Acid reflux   . Anxiety   . High blood pressure    Reviewed med, allergy, problem list.    Observations/Objective: No acute distress.  Productive cough, chronic.  No labored breathing.  Very anxious and concerned.   .. Today's Vitals   07/23/19 1431  Height: 5\' 3"  (1.6 m)   Body mass index is 21.08 kg/m.    Assessment and Plan: [name] KitchenMarland KitchenJemia was seen today for copd.  Diagnoses and all orders for this visit:  Hyponatremia -     Cortisol, free, Serum -     TSH -     Osmolality -     BASIC METABOLIC PANEL WITH GFR -     Sodium, urine, random -     Osmolality, urine  Recurrent UTI -     amoxicillin-clavulanate (AUGMENTIN) 500-125 MG tablet; Take 1 tablet (500 mg total) by mouth 2 (two) times daily. For 7 days.  CKD (chronic kidney disease) stage 3, GFR 30-59 ml/min (HCC)   Pts kidney disease is stable. Her sodium continues to fall. After discussion learn she is drinking excessively to "stay hydrated". On average 120oz of water a day. I think she could be causing the low sodium. Cut back to 64oz of  water a day. Recheck in 1 week with labs to evaluate thyroid/urine sodium/glucocorticoid deficiency.   Treated empirically and due to hx for UTI with augmentin. If no improvement need UA.   Spent time discussing her anxiety over low sodium. Reassured her that we were taking a close watch on this.   Follow Up Instructions:    I discussed the assessment and treatment plan with the patient. The patient was provided an opportunity to ask questions and all were answered. The patient agreed with the plan and  demonstrated an understanding of the instructions.   The patient was advised to call back or seek an in-person evaluation if the symptoms worsen or if the condition fails to improve as anticipated.  I provided 30 minutes of non-face-to-face time during this encounter.   Tandy GawJade Breeback, PA-C

## 2019-07-24 ENCOUNTER — Encounter: Payer: Self-pay | Admitting: Physician Assistant

## 2019-07-29 ENCOUNTER — Telehealth (HOSPITAL_COMMUNITY): Payer: Self-pay | Admitting: Psychiatry

## 2019-07-29 NOTE — Telephone Encounter (Signed)
Its a controlled substance. Both pharmacies are in [place], ask patient to use pharmacy sent prescriptions already

## 2019-07-29 NOTE — Telephone Encounter (Signed)
Pt has a refills on klonopin at [facility] on [address]. However the person that picks up her meds can't pick it up there they can only pick it up at  [facility] on [address].   Can you please resend the rx to that pharmacy.

## 2019-07-29 NOTE — Telephone Encounter (Signed)
Already spoke to patient in regards to this. She states she never has picked up a rx from [address].  Informed her there is nothing we can do she would have to talk to the pharmacy about this or have someone else take her to get the rx.   Dr. De Nurse refused to resend another rx at this time.

## 2019-07-30 ENCOUNTER — Other Ambulatory Visit: Payer: Self-pay

## 2019-07-30 ENCOUNTER — Ambulatory Visit: Payer: Medicare Other | Admitting: Physician Assistant

## 2019-07-30 DIAGNOSIS — N183 Chronic kidney disease, stage 3 unspecified: Secondary | ICD-10-CM

## 2019-07-30 MED ORDER — CLONAZEPAM 0.5 MG PO TABS: ORAL_TABLET | ORAL | 0 refills | Status: DC

## 2019-07-30 NOTE — Addendum Note (Signed)
Addended by: Merian Capron on: 07/30/2019 01:18 PM   Modules accepted: Orders

## 2019-07-30 NOTE — Progress Notes (Signed)
Pt came in for lab collection. She tolerated VP to L arm well. No visible bruising.hematoma, or discomfort during procedure.[name] KitchenMarland KitchenElouise Munroe, [place]

## 2019-07-30 NOTE — Telephone Encounter (Signed)
Called [facility] [address] and discontinued clon[facility]epam. Called patient and left vm informing her that the med was sent [facility] [address]. Nothing further is needed at this time.

## 2019-07-30 NOTE — Telephone Encounter (Signed)
Ok I sent [address] in [place]. But please cancel the other one at [address] so there is no double prescriptions

## 2019-07-30 NOTE — Telephone Encounter (Signed)
[facility] [address] in [place] called for a refill on Clonazepam. They said they can [facility] but it can not be transferred. I looked at the patient medication history and she has been getting all of [facility] [address]. She does not use [address].

## 2019-07-31 NOTE — Progress Notes (Signed)
Thyroid stable and looks good. Sodium perfect! Kidney function improved too. You were drinking too much water. Labs look great now.

## 2019-08-01 ENCOUNTER — Telehealth: Payer: Self-pay | Admitting: Neurology

## 2019-08-01 NOTE — Telephone Encounter (Signed)
Patient left vm wanting results of urine testing. Made her aware these are not yet back and will let her know when they are available.

## 2019-08-02 NOTE — Progress Notes (Signed)
Great news. Urine studies thus far have all come back normal.

## 2019-08-05 ENCOUNTER — Telehealth: Payer: Self-pay

## 2019-08-05 NOTE — Telephone Encounter (Signed)
Patient left msg asking for recent labs to be faxed to Dr Aundra Dubin at [facility] (fax: [phone])  Labs faxed, confirmation received

## 2019-08-06 LAB — OSMOLALITY: Osmolality: 284 mOsm/kg (ref 278–305)

## 2019-08-06 LAB — BASIC METABOLIC PANEL WITH GFR
BUN/Creatinine Ratio: 14 (calc) (ref 6–22)
BUN: 16 mg/dL (ref 7–25)
CO2: 29 mmol/L (ref 20–32)
Calcium: 9.9 mg/dL (ref 8.6–10.4)
Chloride: 99 mmol/L (ref 98–110)
Creat: 1.14 mg/dL — ABNORMAL HIGH (ref 0.50–0.99)
GFR, Est African American: 57 mL/min/{1.73_m2} — ABNORMAL LOW (ref >=60)
GFR, Est Non African American: 49 mL/min/{1.73_m2} — ABNORMAL LOW (ref >=60)
Glucose, Bld: 105 mg/dL — ABNORMAL HIGH (ref 65–99)
Potassium: 4.5 mmol/L (ref 3.5–5.3)
Sodium: 136 mmol/L (ref 135–146)

## 2019-08-06 LAB — TSH: TSH: 2.11 mIU/L (ref 0.40–4.50)

## 2019-08-06 LAB — CORTISOL, FREE

## 2019-08-06 LAB — SODIUM, URINE, RANDOM: Sodium, Ur: 71 mmol/L (ref 28–272)

## 2019-08-13 ENCOUNTER — Ambulatory Visit (INDEPENDENT_AMBULATORY_CARE_PROVIDER_SITE_OTHER): Payer: Medicare Other | Admitting: Licensed Clinical Social Worker

## 2019-08-13 ENCOUNTER — Encounter (HOSPITAL_COMMUNITY): Payer: Self-pay | Admitting: Licensed Clinical Social Worker

## 2019-08-13 DIAGNOSIS — F410 Panic disorder [episodic paroxysmal anxiety] without agoraphobia: Secondary | ICD-10-CM
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
## 2019-08-15 ENCOUNTER — Other Ambulatory Visit: Payer: Self-pay | Admitting: Physician Assistant

## 2019-08-15 NOTE — Progress Notes (Signed)
Camie Patience dont have the telehealth visit info to put it in. The doctors put in their attestation of it. KG LPN

## 2019-08-29 ENCOUNTER — Telehealth: Payer: Self-pay | Admitting: Physician Assistant

## 2019-08-29 NOTE — Telephone Encounter (Signed)
Pt called clinic stating her dog bit her last night and she wanted to be seen. Pt refuses to come into clinic and requested a virtual visit. Pt does not have a smart phone so there is no option for a video visit. Offered Pt appt today at 1600 and advised we can bring her in the back door. She then said she didn't have a mask. I advised we would provide one for her. Pt then states she cannot get ready in 1.5 hrs to be here. She then refuses to come into clinic stating "it's not safe for me there."   Currently the area is dry, no active bleeding. No abnormal redness. No warmth to touch.   I advised her to keep the area clean and dry. Advised to contact clinic if there is any redness, pus, signs of infection, or fever. Dog is not up to date on vaccines. Pt is up to date on tdap.   No further questions.

## 2019-08-29 NOTE — Telephone Encounter (Signed)
Puncture wounds and animal bites in particular run high rates of infection. She does need to be treated. Its good that she is up to date on tdap. Can she at least send a picture? Does her daughter have a smart phone and can come over?

## 2019-08-29 NOTE — Telephone Encounter (Signed)
Pt advised she had no way to send photo. She will call back tomorrow if she can come in.

## 2019-08-30 ENCOUNTER — Telehealth: Payer: Self-pay

## 2019-08-30 NOTE — Telephone Encounter (Signed)
[facility] billing trailer for Folate and Vitamin B12 will not cover with the Iron deficiency anemia D50.9 code.   Covered codes -   G30.9 Alzheimer's disease, unspecified R20.0 Anesthesia of skin E53.8 Deficiency of other specified B group vitamins D52.9 Folate deficiency anemia, unspecified G60.9 Hereditary and idiopathic neuropathy, unspecified E72.11 Homocystinuria K90.9 Intestinal malabsorption, unspecified D53.9 Nutritional anemia, unspecified R41.3 Other amnesia D51.3 Other dietary vitamin B12 deficiency anemia Z79.899 Other long term (current) drug therapy D53.1 Other megaloblastic anemias, not elsewhere classified D51.8 Other vitamin B12 deficiency anemias R20.2 Paresthesia of skin D69.6 Thrombocytopenia, unspecified R26.9 Unspecified abnormalities of gait and mobility F03.90 Unspecified dementia without behavioral disturbance D51.0 Vitamin B12 deficiency anemia due to intrinsic factor deficiency D51.1 Vitamin B12 deficiency anemia due to selective vitamin B12 malabsorption with proteinuria D51.9 Vitamin B12 deficiency anemia, unspecified

## 2019-09-03 NOTE — Telephone Encounter (Signed)
Added to billing trailers.  

## 2019-09-03 NOTE — Telephone Encounter (Signed)
D53.9

## 2019-09-12 ENCOUNTER — Ambulatory Visit: Payer: Medicare Other

## 2019-09-16 ENCOUNTER — Encounter (HOSPITAL_COMMUNITY): Payer: Self-pay | Admitting: Licensed Clinical Social Worker

## 2019-09-16 ENCOUNTER — Ambulatory Visit (INDEPENDENT_AMBULATORY_CARE_PROVIDER_SITE_OTHER): Payer: Medicare Other | Admitting: Licensed Clinical Social Worker

## 2019-09-16 DIAGNOSIS — F411 Generalized anxiety disorder: Secondary | ICD-10-CM | POA: Diagnosis not present

## 2019-09-16 DIAGNOSIS — Z8659 Personal history of other mental and behavioral disorders: Secondary | ICD-10-CM

## 2019-09-16 NOTE — Progress Notes (Signed)
Virtual Visit via Telephone Note  I connected with Claire [name] on 09/16/19 at  2:30 PM EDT by telephone and verified that I am speaking with the correct person using two identifiers.  Location: Patient: Home Provider: Office   I discussed the limitations, risks, security and privacy concerns of performing an evaluation and management service by telephone and the availability of in person appointments. I also discussed with the patient that there may be a patient responsible charge related to this service. The patient expressed understanding and agreed to proceed.     I discussed the assessment and treatment plan with the patient. The patient was provided an opportunity to ask questions and all were answered. The patient agreed with the plan and demonstrated an understanding of the instructions.   The patient was advised to call back or seek an in-person evaluation if the symptoms worsen or if the condition fails to improve as anticipated.  I provided 50 minutes of non-face-to-face time during this encounter.   Archie Balboa, LCAS-A    THERAPIST PROGRESS NOTE  Session Time: 2:30-3:30  Participation Level: Active  Behavioral Response: NAAlertEuthymic  Type of Therapy: Individual Therapy  Treatment Goals addressed: Anxiety  Interventions: CBT and Supportive  Summary: Claire [name] is a 68 y.o. female who presents with hx of Panic and PTSD. She reports she continues to tolerate her sxs of anxiety "fairly well". She is worried about upcoming Presidential Election. She continues to stay home, quarantined as recommended by her doctor. She states that I have helped her process her unresolved resentment towards her mother and the confusion she felt for loving her grandmother more than her mother. We agree that PT does not need to continue counseling at this time.   Suicidal/Homicidal: Nowithout intent/plan  Therapist Response: I used open questions, reflection, and meaning  making.  Plan: Return again as needed. Continue medication management w/ Dr. De Nurse  Diagnosis:    ICD-10-CM   1. GAD (generalized anxiety disorder)  F41.1   2. History of posttraumatic stress disorder (PTSD)  Z86.59       Archie Balboa, LCAS-A 09/16/2019

## 2019-09-18 ENCOUNTER — Ambulatory Visit (INDEPENDENT_AMBULATORY_CARE_PROVIDER_SITE_OTHER): Payer: Medicare Other | Admitting: Physician Assistant

## 2019-09-18 ENCOUNTER — Encounter: Payer: Self-pay | Admitting: Physician Assistant

## 2019-09-18 VITALS — Ht 63.0 in

## 2019-09-18 DIAGNOSIS — J441 Chronic obstructive pulmonary disease with (acute) exacerbation: Secondary | ICD-10-CM | POA: Diagnosis not present

## 2019-09-18 DIAGNOSIS — J432 Centrilobular emphysema: Secondary | ICD-10-CM | POA: Diagnosis not present

## 2019-09-18 MED ORDER — LEVOFLOXACIN 500 MG PO TABS: 500.0000 mg | ORAL_TABLET | Freq: Every day | ORAL | 0 refills | Status: DC

## 2019-09-18 NOTE — Progress Notes (Signed)
Patient ID: Claire [name], female   DOB: 04-05-1951, 68 y.o.   MRN: [id number] .[name] KitchenVirtual Visit via Telephone Note  I connected with SHARMEL BALLANTINE on 09/20/19 at 11:30 AM EDT by telephone and verified that I am speaking with the correct person using two identifiers.  Location: Patient: home Provider: clinic   I discussed the limitations, risks, security and privacy concerns of performing an evaluation and management service by telephone and the availability of in person appointments. I also discussed with the patient that there may be a patient responsible charge related to this service. The patient expressed understanding and agreed to proceed.   History of Present Illness: Pt is a 68 yo female with COPD, hypothyroidism, CKD, anemia who calls into the clinic with worsening SOB, Cough, mucus production. She does feel bad. She denies any fever, chills, body aches, loss of smell or taste, GI symptoms. She has mucus all the time it is just getting worse. She has mucinex and zyrtec that helps but she does not use every day. She has not left the house since march except to do a drive through blood draw. She is using her albuterol inhaler at least once a day which helps. For the past few days needed more. No sick contacts. No direct contact to covid.   .. Active Ambulatory Problems    Diagnosis Date Noted  . PTSD (post-traumatic stress disorder) 11/10/2011  . Neurosis, anxiety, panic type 11/10/2011  . Acquired hypothyroidism 11/24/2014  . BP (high blood pressure) 07/24/2012  . Hypothyroid   . Chronic use of benzodiazepine for therapeutic purpose 08/11/2015  . Hyponatremia 12/19/2016  . Hyperkalemia 12/19/2016  . Centrilobular emphysema ([place]) 12/20/2016  . Decreased hemoglobin 12/20/2016  . CKD (chronic kidney disease) stage 3, GFR 30-59 ml/min 01/16/2017  . Iron deficiency anemia 01/16/2017  . Anemia of chronic disease 05/11/2017  . Low serum vitamin B12 05/14/2017  . Vitamin D deficiency  05/14/2017  . Depressed mood 05/14/2017  . High serum parathyroid hormone (PTH) 07/19/2017  . Normocytic anemia 07/19/2017  . DOE (dyspnea on exertion) 05/09/2018  . Recurrent UTI 05/07/2019  . Leg cramps 05/29/2019  . No energy 05/29/2019   Resolved Ambulatory Problems    Diagnosis Date Noted  . Acute renal failure ([place]) 12/19/2016  . Acute kidney injury ([place]) 03/17/2017   Past Medical History:  Diagnosis Date  . Acid reflux   . Anxiety   . High blood pressure    Reviewed med, allergy, problem list.     Observations/Objective: No acute distress. Productive cough on phone call.  No labored breathing.   .. Today's Vitals   09/18/19 1041  Height: 5\' 3"  (1.6 m)   Body mass index is 21.08 kg/m.    Assessment and Plan: [name] KitchenMarland KitchenEniola was seen today for follow-up.  Diagnoses and all orders for this visit:  COPD exacerbation ([place]) -     levofloxacin (LEVAQUIN) 500 MG tablet; Take 1 tablet (500 mg total) by mouth daily.  Centrilobular emphysema (HCC)   Symptoms consistent with COPD exacerbation. Given levaquin due to intolerance to other appropriate antibiotics and prednisone burst. She has one at home that she never used. Discussed to use it. Continue albuterol as needed ever 2-4 hours. Continue daily mucinex and zyrtec. Consider humidifier in room. Use O2 at night and as needed during the day.   Discussed low dose CT lung screening. Pt declined today.   She is concerned her symptoms are worsening. Discussed switching up maintaince medication. She declines  today.  Needs flu shot come in next week for flu shot.  Dicussed b12 shots again. Ok to consider in the future since they made her feel so much better.    Follow Up Instructions:    I discussed the assessment and treatment plan with the patient. The patient was provided an opportunity to ask questions and all were answered. The patient agreed with the plan and demonstrated an understanding of the instructions.    The patient was advised to call back or seek an in-person evaluation if the symptoms worsen or if the condition fails to improve as anticipated.   Spent 30 minutes on phone with patient discussing treatment plan, options and progression of COPD.   Tandy Gaw, PA-C

## 2019-09-20 ENCOUNTER — Encounter: Payer: Self-pay | Admitting: Physician Assistant

## 2019-09-30 ENCOUNTER — Telehealth: Payer: Self-pay

## 2019-09-30 NOTE — Telephone Encounter (Signed)
Called and advised patient... she states that she forgot that last time she took it she actually got dehydrated and had to go to hospital.   She decided she is just not going to take anything for now and if she doesn't feel better she will just take the [place].

## 2019-09-30 NOTE — Telephone Encounter (Signed)
Cipro just does not work on the lungs well. What about clarithromycin? Would you be ok with [place] sending that?

## 2019-09-30 NOTE — Telephone Encounter (Signed)
Patient states that she is unsure of taking that because she has never had it before. She is very afraid of taking a new medication and then havng an allergic reaction to it and no one being able to help her.   Patient states that she would try Azithromycin, if Luvenia Starch thought it was appropriate. She is not allergic to this, it just causes diarrhea. She wants to know if she can try that and also take imodium for diarrhea.

## 2019-09-30 NOTE — Telephone Encounter (Signed)
Patient called stating that she is still not feeling great, but is too nervous to take Levaquin. Wanting to know if she can have Cipro called into pharmacy instead?   Also- patient notes that she had a fall last night because dog got in between her legs and caused her to trip. States she broke her fall with her hands on ground. She is having some right wrist pain, but nothing extreme. She will keep [place] updated on this.

## 2019-09-30 NOTE — Telephone Encounter (Signed)
Ok for zpak 2 tablets now and then one tablet for 4 days. #6 NRF. Take with probiotic and with a meal.

## 2019-10-01 NOTE — Telephone Encounter (Signed)
Ok

## 2019-10-04 ENCOUNTER — Telehealth: Payer: Self-pay

## 2019-10-04 NOTE — Telephone Encounter (Signed)
The levaquin that she has would treat both UTI and lung infection.

## 2019-10-04 NOTE — Telephone Encounter (Signed)
Patient calls with complaints of urinary SX. Denied any blood in urine, fevers, polyuria. Patient's only complaint is that "sometimes it feels funny to pee".   I advised patient that she would have to have an appointment and she would need to provider urine sample to make sure we treat appropriately. Patient very hesitant and afraid to come to office.   I advised patient could do virtual, but she would still need to drop off a urine.... patient declined. I advised patient that she needs to be evaluated. She refuses to be seen and states "if it is bad enough I will be seen Monday". I advised patient that if SX worsen over the weekend she needs to be seen at urgent care. Patient agreeable.  FYI to PCP

## 2019-10-08 ENCOUNTER — Encounter: Payer: Self-pay | Admitting: Physician Assistant

## 2019-10-08 ENCOUNTER — Ambulatory Visit (INDEPENDENT_AMBULATORY_CARE_PROVIDER_SITE_OTHER): Payer: Medicare Other | Admitting: Physician Assistant

## 2019-10-08 VITALS — Ht 63.0 in

## 2019-10-08 DIAGNOSIS — Z8639 Personal history of other endocrine, nutritional and metabolic disease: Secondary | ICD-10-CM

## 2019-10-08 DIAGNOSIS — J432 Centrilobular emphysema: Secondary | ICD-10-CM

## 2019-10-08 DIAGNOSIS — F410 Panic disorder [episodic paroxysmal anxiety] without agoraphobia: Secondary | ICD-10-CM | POA: Diagnosis not present

## 2019-10-08 NOTE — Progress Notes (Signed)
Patient ID: Claire [name], female   DOB: 1951-11-26, 68 y.o.   MRN: [id number] .[name] KitchenVirtual Visit via Telephone Note  I connected with Claire [name] on 10/09/19 at  3:20 PM EST by telephone and verified that I am speaking with the correct person using two identifiers.  Location: Patient: home Provider: clinic   I discussed the limitations, risks, security and privacy concerns of performing an evaluation and management service by telephone and the availability of in person appointments. I also discussed with the patient that there may be a patient responsible charge related to this service. The patient expressed understanding and agreed to proceed.   History of Present Illness: Pt is a 68 yo female with COPD, CKD, anemia of chronic disease who is anxious about her health.   She is doing better after taking prednisone for COPD exacerbation. She has less productive sputum. She is doing neb treatment about once a day and continues to zyrtec. She continues to have some drainage down the back of her throat. She wonders if ok to get flu shot. She denies any fever, chills, body aches.   She reports some weight loss. She feels like it is due to her not wanting to eat foods with potassium in it due to her hx of hypokalemia. She has not had any problems with low potassium recently.   .. Active Ambulatory Problems    Diagnosis Date Noted  . PTSD (post-traumatic stress disorder) 11/10/2011  . Neurosis, anxiety, panic type 11/10/2011  . Acquired hypothyroidism 11/24/2014  . BP (high blood pressure) 07/24/2012  . Hypothyroid   . Chronic use of benzodiazepine for therapeutic purpose 08/11/2015  . Hyponatremia 12/19/2016  . Hyperkalemia 12/19/2016  . Centrilobular emphysema ([place]) 12/20/2016  . Decreased hemoglobin 12/20/2016  . CKD (chronic kidney disease) stage 3, GFR 30-59 ml/min 01/16/2017  . Iron deficiency anemia 01/16/2017  . Anemia of chronic disease 05/11/2017  . Low serum vitamin B12 05/14/2017   . Vitamin D deficiency 05/14/2017  . Depressed mood 05/14/2017  . High serum parathyroid hormone (PTH) 07/19/2017  . Normocytic anemia 07/19/2017  . DOE (dyspnea on exertion) 05/09/2018  . Recurrent UTI 05/07/2019  . Leg cramps 05/29/2019  . No energy 05/29/2019  . History of hypokalemia 10/09/2019   Resolved Ambulatory Problems    Diagnosis Date Noted  . Acute renal failure ([place]) 12/19/2016  . Acute kidney injury ([place]) 03/17/2017   Past Medical History:  Diagnosis Date  . Acid reflux   . Anxiety   . High blood pressure    Reviewed med, allergy, problem list.   Observations/Objective: No acute distress. Normal breathing.  Random dry to productive cough.   .. Today's Vitals   10/08/19 1408  Height: 5\' 3"  (1.6 m)   Body mass index is 21.08 kg/m.    Assessment and Plan: [name] KitchenMarland KitchenHazell was seen today for follow-up.  Diagnoses and all orders for this visit:  Centrilobular emphysema ([place])  History of hypokalemia  Neurosis, anxiety, panic type   Reassured that patient is doing better with only prednisone. Continue daily allergy medication. Could consider allergy referral for PND.   Encouraged patient to expand her diet and not be so strict on potassium. If she does this will check potassium in 1 week when she gets her flu shot.   Follow Up Instructions:    I discussed the assessment and treatment plan with the patient. The patient was provided an opportunity to ask questions and all were answered. The patient agreed with the  plan and demonstrated an understanding of the instructions.   The patient was advised to call back or seek an in-person evaluation if the symptoms worsen or if the condition fails to improve as anticipated.  I provided 30 minutes of non-face-to-face time during this encounter.   Tandy Gaw, PA-C

## 2019-10-09 DIAGNOSIS — Z8639 Personal history of other endocrine, nutritional and metabolic disease: Secondary | ICD-10-CM | POA: Insufficient documentation

## 2019-10-19 ENCOUNTER — Other Ambulatory Visit: Payer: Self-pay | Admitting: Physician Assistant

## 2019-10-19 DIAGNOSIS — J432 Centrilobular emphysema: Secondary | ICD-10-CM

## 2019-10-21 ENCOUNTER — Encounter (HOSPITAL_COMMUNITY): Payer: Self-pay | Admitting: Psychiatry

## 2019-10-21 ENCOUNTER — Ambulatory Visit (INDEPENDENT_AMBULATORY_CARE_PROVIDER_SITE_OTHER): Payer: Medicare Other | Admitting: Psychiatry

## 2019-10-21 DIAGNOSIS — F410 Panic disorder [episodic paroxysmal anxiety] without agoraphobia: Secondary | ICD-10-CM | POA: Diagnosis not present

## 2019-10-21 DIAGNOSIS — F4310 Post-traumatic stress disorder, unspecified: Secondary | ICD-10-CM

## 2019-10-21 DIAGNOSIS — F411 Generalized anxiety disorder: Secondary | ICD-10-CM | POA: Diagnosis not present

## 2019-10-21 MED ORDER — CLONAZEPAM 0.5 MG PO TABS: ORAL_TABLET | ORAL | 0 refills | Status: DC

## 2019-10-21 NOTE — Progress Notes (Signed)
Patient ID: Claire [name], female   DOB: [birth year], 68 y.o.   MRN: [id number]  [facility] Outpatient Follow up visit [address] Mertis Mosher [phone] 68 y.o.  10/21/2019 3:05 PM  Chief Complaint:  Anxiety follow up  History of Present Illness:    I connected with Carl Best on 10/21/19 at  3:00 PM EST by telephone and verified that I am speaking with the correct person using two identifiers.  I discussed the limitations, risks, security and privacy concerns of performing an evaluation and management service by telephone and the availability of in person appointments. I also discussed with the patient that there may be a patient responsible charge related to this service. The patient expressed understanding and agreed to proceed.  Doing fair since living seperately from daughter Had an accident so talking in therapy and helpful Understands the risk of forgetfullness but says memory is good Wants to keep klonopine without any change, it helps anxiety Modifying factor: breahting techniques. grandkids   PTSD is related to her past some flashbacks. Distraction helps Depression not worse No side effects     Aggravating factors:daughter  No psychosis  Medical History; Past Medical History:  Diagnosis Date  . Acid reflux   . Anxiety   . High blood pressure   . High serum parathyroid hormone (PTH) 07/19/2017   96 06/28/2014, 162 12/29/16  . Hypothyroid   . PTSD (post-traumatic stress disorder)     Allergies: Allergies  Allergen Reactions  . Bactrim [Sulfamethoxazole-Trimethoprim]     ACUTE RENAL fAILURE/HYPONATREMIA/HYPERKALEMIA  . Macrodantin [Nitrofurantoin Macrocrystal] Rash  . Trimethoprim     Fatigue/weakness/no appetite  . Zithromax [Azithromycin]     diarrhea    Medications: Outpatient Encounter Medications as of 10/21/2019  Medication Sig  . albuterol (PROVENTIL) (2.5 MG/3ML) 0.083% nebulizer solution Take 3 mLs (2.5 mg total) by  nebulization every 4 (four) hours as needed for wheezing or shortness of breath.  . AMBULATORY NON FORMULARY MEDICATION Continuous stationary and portable oxygen tanks for use with ambulation. DX: COPD with hypoxia.  . AMBULATORY NON FORMULARY MEDICATION Home concentrator and one Imogen G3 portable oxygen concentrator for hypoxia.  . AMBULATORY NON FORMULARY MEDICATION Portable oxygen compressor device for portable O2 administration continuous 2L/min while walking. Dx COPD w/ hypoxia. Titrate for OCD (oxygen conserving device).  . AMBULATORY NON FORMULARY MEDICATION One pulse ox to manage SOB and O2 use.  [name] atenolol (TENORMIN) 25 MG tablet TAKE 1 TABLET BY MOUTH EVERY DAY  . cetirizine (ZYRTEC) 10 MG chewable tablet Chew 10 mg by mouth daily.  . cholecalciferol (VITAMIN D) 25 MCG (1000 UT) tablet Take 1,000 Units by mouth daily.  . clonazePAM (KLONOPIN) 0.5 MG tablet TAKE 1 TABLET BY MOUTH 3 TIMES A DAY  . dextromethorphan-guaiFENesin (MUCINEX DM) 30-600 MG 12hr tablet Take 1 tablet by mouth 2 (two) times daily.  . diclofenac sodium (VOLTAREN) 1 % GEL Apply 4 g topically 4 (four) times daily. To affected joint.  [name] estradiol (ESTRACE VAGINAL) 0.1 MG/GM vaginal cream Spread a pea-sized amount around the urethra opening three times a week.  . famotidine (PEPCID) 20 MG tablet TAKE 1 TABLET BY MOUTH TWICE A DAY  . ferrous sulfate 325 (65 FE) MG EC tablet 1 tab PO TID every other day  . levothyroxine (SYNTHROID) 75 MCG tablet TAKE 1 TABLET BY MOUTH EVERY DAY  . Magnesium 250 MG TABS Take 250 mg by mouth. Take one tablet daily  . mometasone-formoterol (DULERA) 100-5 MCG/ACT  AERO TAKE 2 PUFFS BY MOUTH TWICE A DAY  . PROAIR HFA 108 (90 Base) MCG/ACT inhaler TAKE 2 PUFFS BY MOUTH EVERY 6 HOURS AS NEEDED FOR WHEEZE OR SHORTNESS OF BREATH  . SPIRIVA RESPIMAT 2.5 MCG/ACT AERS TAKE 2 PUFFS ONCE A DAY.  . [DISCONTINUED] clonazePAM (KLONOPIN) 0.5 MG tablet TAKE 1 TABLET BY MOUTH 3 TIMES A DAY   No  facility-administered encounter medications on file as of 10/21/2019.     Family History; Family History  Problem Relation Age of Onset  . Anxiety disorder Mother   . OCD Daughter   . Alcohol abuse Father   . Anxiety disorder Sister        Labs:  Recent Results (from the past 2160 hour(s))  Cortisol, free, Serum     Status: None   Collection Time: 07/24/19  7:06 AM  Result Value Ref Range   Cortisol Free, Ser CANCELED     Comment: TEST NOT PERFORMED . The sample exceeds stability for the test requested.  Submission of a new sample is required for testing.  Result canceled by the ancillary.   TSH     Status: None   Collection Time: 07/24/19  7:06 AM  Result Value Ref Range   TSH 2.11 0.40 - 4.50 mIU/L  Osmolality     Status: None   Collection Time: 07/24/19  7:06 AM  Result Value Ref Range   Osmolality 284 278 - 305 mOsm/kg  BASIC METABOLIC PANEL WITH GFR     Status: Abnormal   Collection Time: 07/24/19  7:06 AM  Result Value Ref Range   Glucose, Bld 105 (H) 65 - 99 mg/dL    Comment: .            Fasting reference interval . For someone without known diabetes, a glucose value between 100 and 125 mg/dL is consistent with prediabetes and should be confirmed with a follow-up test. .    BUN 16 7 - 25 mg/dL   Creat 4.251.14 (H) 9.560.50 - 0.99 mg/dL    Comment: For patients >68 years of age, the reference limit for Creatinine is approximately 13% higher for people identified as African-American. .    GFR, Est Non African American 49 (L) > OR = 60 mL/min/1.4973m2   GFR, Est African American 57 (L) > OR = 60 mL/min/1.3273m2   BUN/Creatinine Ratio 14 6 - 22 (calc)   Sodium 136 135 - 146 mmol/L   Potassium 4.5 3.5 - 5.3 mmol/L   Chloride 99 98 - 110 mmol/L   CO2 29 20 - 32 mmol/L   Calcium 9.9 8.6 - 10.4 mg/dL  Sodium, urine, random     Status: None   Collection Time: 07/24/19  7:06 AM  Result Value Ref Range   Sodium, Ur 71 28 - 272 mmol/L      Mental Status  Examination;   Psychiatric Specialty Exam: Physical Exam  Review of Systems  Cardiovascular: Negative for chest pain and palpitations.  Skin: Negative for itching.  Psychiatric/Behavioral: Negative for depression and suicidal ideas.    There were no vitals taken for this visit.There is no height or weight on file to calculate BMI.  General Appearance:   Eye Contact::    Speech:  Slow  Volume:  Decreased  Mood:fair  Affect:  congruent  Thought Process:  Coherent and intact  Orientation:  Full (Time, Place, and Person)  Thought Content:  Rumination  Suicidal Thoughts:  No  Homicidal Thoughts:  No  Memory:  Immediate;   Fair Recent;   Fair  Judgement:  Fair  Insight:  Shallow  Psychomotor Activity:  Normal  Concentration:  Fair  Recall:  Fair  Akathisia:  Negative  Handed:  Right  AIMS (if indicated):     Assets:  Social Support Vocational/Educational  Sleep:        Assessment: Axis I: Panic disorder. Possible PTSD. Rule out generalized anxiety disorder. Nicotine dependence  Axis II: Deferred  Axis III:  Past Medical History:  Diagnosis Date  . Acid reflux   . Anxiety   . High blood pressure   . High serum parathyroid hormone (PTH) 07/19/2017   96 06/28/2014, 162 12/29/16  . Hypothyroid   . PTSD (post-traumatic stress disorder)     Axis IV: Psychosocial. History of trauma   Treatment Plan and Summary:  Anxiety and GAD: doing fair, continue klonopine, she does not want to change, cautioned to not increase dose. Continue therapy PTSD: baseline. continuie disraction techniques Does not want to be on sSRi  But just klonopine    I discussed the assessment and treatment plan with the patient. The patient was provided an opportunity to ask questions and all were answered. The patient agreed with the plan and demonstrated an understanding of the instructions.   The patient was advised to call back or seek an in-person evaluation if the symptoms worsen or if the  condition fails to improve as anticipated.  I provided 15 minutes of non-face-to-face time during this encounter. Fu 76m.   Merian Capron, MD 10/21/2019

## 2019-10-28 ENCOUNTER — Encounter: Payer: Self-pay | Admitting: Physician Assistant

## 2019-10-28 ENCOUNTER — Other Ambulatory Visit: Payer: Self-pay | Admitting: Physician Assistant

## 2019-10-28 ENCOUNTER — Other Ambulatory Visit (INDEPENDENT_AMBULATORY_CARE_PROVIDER_SITE_OTHER): Payer: Medicare Other | Admitting: Physician Assistant

## 2019-10-28 ENCOUNTER — Telehealth: Payer: Self-pay | Admitting: Physician Assistant

## 2019-10-28 DIAGNOSIS — J432 Centrilobular emphysema: Secondary | ICD-10-CM

## 2019-10-28 DIAGNOSIS — R300 Dysuria: Secondary | ICD-10-CM | POA: Diagnosis not present

## 2019-10-28 LAB — POCT URINALYSIS DIP (CLINITEK)
Bilirubin, UA: NEGATIVE
Blood, UA: NEGATIVE
Glucose, UA: NEGATIVE mg/dL
Ketones, POC UA: NEGATIVE mg/dL
Nitrite, UA: NEGATIVE
POC PROTEIN,UA: NEGATIVE
Spec Grav, UA: 1.025 (ref 1.010–1.025)
Urobilinogen, UA: 0.2 E.U./dL
pH, UA: 7 (ref 5.0–8.0)

## 2019-10-28 NOTE — Progress Notes (Signed)
Will culture. Small leuks only. No blood, nitrites.

## 2019-10-28 NOTE — Telephone Encounter (Signed)
If she has someone that can pick up urine cup we can do it that way as well. You have had symptoms before like this that were not infection. We don't want to continue to treat with abx if  you do not need? Can someone pick up a urine cup for you and drop it off?

## 2019-10-28 NOTE — Telephone Encounter (Signed)
I called patient and she reports that she has chronic kidney disease. She was offered to do a drive by for a urine sample to get the supplies. She does not want to do a drive by for getting a urine cup and and the wipes for a clean catch and her for her to bring it back. She does not have transportation at this time. She has had a panic attack all weekend because she has not had anyone to take her anywhere and she has had burning when she urinates. She has increased her water intake and this helps. She wants to have some Augmentin sent to the pharmacy[facility] Please advise.

## 2019-10-28 NOTE — Telephone Encounter (Signed)
Patient called the after hours nurse with possible UTI. I have called her to get more information. Waiting on a call back from patient.

## 2019-10-28 NOTE — Telephone Encounter (Signed)
I called the patient back and she is going to see if her daughter could drop by and pick up a container for her mom. Waiting on a call back.

## 2019-10-28 NOTE — Progress Notes (Signed)
Patient has c/o urinary SX. Painful urination. No fevers. Some right flank pain. Urinary SX started Friday 10/25/19.  Phone note from today:  "I called patient and she reports that she has chronic kidney disease. She was offered to do a drive by for a urine sample to get the supplies. She does not want to do a drive by for getting a urine cup and and the wipes for a clean catch and her for her to bring it back. She does not have transportation at this time. She has had a panic attack all weekend because she has not had anyone to take her anywhere and she has had burning when she urinates. She has increased her water intake and this helps. She wants to have some Augmentin sent [facility] the [facility] Please advise. "  Per Luvenia Starch, patient is having daughter drop off urine in sterile cup to do a urine dip. Culture done.

## 2019-10-29 NOTE — Telephone Encounter (Signed)
Patient dropped off the urine sample with Apolonio Schneiders and the culture was sent to lab. Waiting on results and patient aware we will call once they are in.

## 2019-10-30 LAB — URINE CULTURE
MICRO NUMBER:: 1129832
SPECIMEN QUALITY:: ADEQUATE

## 2019-10-30 MED ORDER — AMOXICILLIN-POT CLAVULANATE 500-125 MG PO TABS: 1.0000 | ORAL_TABLET | Freq: Three times a day (TID) | ORAL | 0 refills | Status: DC

## 2019-10-30 NOTE — Progress Notes (Signed)
A very small amt of bacteria detected but under the amt that we usually treat. How are your symptoms today?

## 2019-10-30 NOTE — Progress Notes (Signed)
Sent augmentin for 7 days.

## 2019-12-02 ENCOUNTER — Ambulatory Visit (INDEPENDENT_AMBULATORY_CARE_PROVIDER_SITE_OTHER): Payer: Medicare Other | Admitting: Family Medicine

## 2019-12-02 ENCOUNTER — Encounter: Payer: Self-pay | Admitting: Family Medicine

## 2019-12-02 DIAGNOSIS — R300 Dysuria: Secondary | ICD-10-CM | POA: Diagnosis not present

## 2019-12-02 DIAGNOSIS — Z87440 Personal history of urinary (tract) infections: Secondary | ICD-10-CM

## 2019-12-02 MED ORDER — CIPROFLOXACIN HCL 250 MG PO TABS: 250.0000 mg | ORAL_TABLET | Freq: Two times a day (BID) | ORAL | 0 refills | Status: DC

## 2019-12-02 NOTE — Addendum Note (Signed)
Addended by: Beatrice Lecher D on: 12/02/2019 03:05 PM   Modules accepted: Orders

## 2019-12-02 NOTE — Progress Notes (Addendum)
Virtual Visit via Telephone Note  I connected with Claire [name] on 12/02/19 at  2:00 PM EST by telephone and verified that I am speaking with the correct person using two identifiers.   I discussed the limitations, risks, security and privacy concerns of performing an evaluation and management service by telephone and the availability of in person appointments. I also discussed with the patient that there may be a patient responsible charge related to this service. The patient expressed understanding and agreed to proceed.   Subjective:    CC: Possible UTI  HPI: 68 yo female with IBS and frequent UTIs.  Last time she had symptoms was November 23.  Urine culture at that time was negative and urinalysis only showed leukocytes.  It sounds like she used some wipes that she has at home.  But it is not clear if they are truly antiseptic wipes.  She says they are not flushable.  She started having symptoms again with frequency and pain after urination that started Christmas Eve, approximately 5 days ago.  No blood in urine.  No fevers, chills, or sweats.  No external rash or vaginal symptoms.   Past medical history, Surgical history, Family history not pertinant except as noted below, Social history, Allergies, and medications have been entered into the medical record, reviewed, and corrections made.   Review of Systems: No fevers, chills, night sweats, weight loss, chest pain, or shortness of breath.   Objective:    General: Speaking clearly in complete sentences without any shortness of breath.  Alert and oriented x3.  Normal judgment. No apparent acute distress.    Impression and Recommendations:   Dysuria-the pain is at the end of urination.  Asked her to drop off a urine specimen.  She says she has sterile cups at home.  I believe wipes upfront.  But she says she has some type of white at home but again not sure if it is truly in an acceptable white.  Just explained the importance of getting  a good sample to make sure that it is accurate and that we are treating the right thing.  He wanted me to call in an antibiotic preemptively.  I am a little bit hesitant because the last urinalysis was negative.  Will await urinalysis results before prescribing any antibiotics.  Is intolerant to trimethoprim as well as Macrodantin, and Bactrim.  Just had recent course of Augmentin about 3 to 4 weeks ago.  Urinalysis was positive for nitrites and leukocytes as well as some trace blood.  Will send culture.  We will go ahead and send over antibiotic.  Cipro based on intolerances and recent use of Augmentin.   I discussed the assessment and treatment plan with the patient. The patient was provided an opportunity to ask questions and all were answered. The patient agreed with the plan and demonstrated an understanding of the instructions.   The patient was advised to call back or seek an in-person evaluation if the symptoms worsen or if the condition fails to improve as anticipated.  I provided 20 minutes of non-face-to-face time during this encounter.   Beatrice Lecher, MD

## 2019-12-04 ENCOUNTER — Encounter: Payer: Self-pay | Admitting: Family Medicine

## 2019-12-04 LAB — URINALYSIS, ROUTINE W REFLEX MICROSCOPIC
Bilirubin Urine: NEGATIVE
Glucose, UA: NEGATIVE
Hyaline Cast: NONE SEEN /LPF
Ketones, ur: NEGATIVE
Nitrite: POSITIVE — AB
Protein, ur: NEGATIVE
RBC / HPF: NONE SEEN /HPF (ref 0–2)
Specific Gravity, Urine: 1.011 (ref 1.001–1.03)
Squamous Epithelial / HPF: NONE SEEN /HPF (ref <=5)
WBC, UA: >=60 /HPF — AB (ref 0–5)
pH: 6 (ref 5.0–8.0)

## 2019-12-04 LAB — URINE CULTURE
MICRO NUMBER:: 1233323
SPECIMEN QUALITY:: ADEQUATE

## 2019-12-04 MED ORDER — AMOXICILLIN-POT CLAVULANATE 875-125 MG PO TABS: 1.0000 | ORAL_TABLET | Freq: Two times a day (BID) | ORAL | 0 refills | Status: DC

## 2019-12-04 NOTE — Addendum Note (Signed)
Addended by: Beatrice Lecher D on: 12/04/2019 11:52 AM   Modules accepted: Orders

## 2019-12-07 ENCOUNTER — Other Ambulatory Visit: Payer: Self-pay | Admitting: Physician Assistant

## 2019-12-22 ENCOUNTER — Other Ambulatory Visit: Payer: Self-pay | Admitting: Physician Assistant

## 2019-12-22 DIAGNOSIS — I10 Essential (primary) hypertension: Secondary | ICD-10-CM

## 2019-12-24 ENCOUNTER — Telehealth (INDEPENDENT_AMBULATORY_CARE_PROVIDER_SITE_OTHER): Payer: Medicare Other | Admitting: Physician Assistant

## 2019-12-24 ENCOUNTER — Encounter: Payer: Self-pay | Admitting: Physician Assistant

## 2019-12-24 VITALS — Ht 63.0 in | Wt 119.0 lb

## 2019-12-24 DIAGNOSIS — J432 Centrilobular emphysema: Secondary | ICD-10-CM | POA: Diagnosis not present

## 2019-12-24 DIAGNOSIS — J9611 Chronic respiratory failure with hypoxia: Secondary | ICD-10-CM

## 2019-12-24 DIAGNOSIS — N390 Urinary tract infection, site not specified: Secondary | ICD-10-CM

## 2019-12-24 DIAGNOSIS — Z7189 Other specified counseling: Secondary | ICD-10-CM | POA: Diagnosis not present

## 2019-12-24 DIAGNOSIS — K582 Mixed irritable bowel syndrome: Secondary | ICD-10-CM

## 2019-12-24 DIAGNOSIS — N951 Menopausal and female climacteric states: Secondary | ICD-10-CM

## 2019-12-24 DIAGNOSIS — Z7185 Encounter for immunization safety counseling: Secondary | ICD-10-CM | POA: Insufficient documentation

## 2019-12-24 DIAGNOSIS — N1831 Chronic kidney disease, stage 3a: Secondary | ICD-10-CM | POA: Diagnosis not present

## 2019-12-24 MED ORDER — AMBULATORY NON FORMULARY MEDICATION: 0 refills | Status: DC

## 2019-12-24 MED ORDER — ESTRADIOL 0.1 MG/GM VA CREA: 1.0000 | TOPICAL_CREAM | VAGINAL | 1 refills | Status: DC

## 2019-12-24 NOTE — Patient Instructions (Addendum)
Start probiotic  daily

## 2019-12-24 NOTE — Progress Notes (Signed)
Patient ID: Claire [name], female   DOB: 13-Oct-1951, 69 y.o.   MRN: [id number] .[name] KitchenVirtual Visit via Telephone Note  I connected with PRESTON WEILL on 12/25/19 at  1:00 PM EST by telephone and verified that I am speaking with the correct person using two identifiers.  Location: Patient: home Provider: clinic   I discussed the limitations, risks, security and privacy concerns of performing an evaluation and management service by telephone and the availability of in person appointments. I also discussed with the patient that there may be a patient responsible charge related to this service. The patient expressed understanding and agreed to proceed.   History of Present Illness: Pt is a 69 yo female with Emphysema, hypothyroidism, CKD, IBS mixed, recurrent UTI who calls into the clinic to discuss health.   Patient was recently seen in clinic and urine culture showed E. coli.  She was originally treated with Cipro and had to be changed to Augmentin.  Symptoms have resolved.  She does believe that her loose stools from IBS cost the infection.  She wonders what she can do about her IBS symptoms.they are mixed from constipation to diarrhea. She feels like she is more constipated than problems with diarrhea. She did get some theraworx wipes and wonders if she could use them.   Questions about covid vaccine. She would like portable oxygen. She wants rx.   .. Active Ambulatory Problems    Diagnosis Date Noted  . PTSD (post-traumatic stress disorder) 11/10/2011  . Neurosis, anxiety, panic type 11/10/2011  . Acquired hypothyroidism 11/24/2014  . BP (high blood pressure) 07/24/2012  . Hypothyroid   . Chronic use of benzodiazepine for therapeutic purpose 08/11/2015  . Hyponatremia 12/19/2016  . Hyperkalemia 12/19/2016  . Centrilobular emphysema ([place]) 12/20/2016  . Decreased hemoglobin 12/20/2016  . CKD (chronic kidney disease) stage 3, GFR 30-59 ml/min 01/16/2017  . Iron deficiency anemia 01/16/2017   . Anemia of chronic disease 05/11/2017  . Low serum vitamin B12 05/14/2017  . Vitamin D deficiency 05/14/2017  . Depressed mood 05/14/2017  . High serum parathyroid hormone (PTH) 07/19/2017  . Normocytic anemia 07/19/2017  . DOE (dyspnea on exertion) 05/09/2018  . Recurrent UTI 05/07/2019  . Leg cramps 05/29/2019  . No energy 05/29/2019  . History of hypokalemia 10/09/2019  . Vaccine counseling 12/24/2019  . Menopausal vaginal dryness 12/24/2019   Resolved Ambulatory Problems    Diagnosis Date Noted  . Acute renal failure ([place]) 12/19/2016  . Acute kidney injury ([place]) 03/17/2017   Past Medical History:  Diagnosis Date  . Acid reflux   . Anxiety   . High blood pressure    Reviewed med, allergies, problem.      Observations/Objective: No acute distress. No labored breathing.  Anxious about health.   .. Today's Vitals   12/24/19 1104  Weight: 119 lb (54 kg)  Height: 5\' 3"  (1.6 m)   Body mass index is 21.08 kg/m.     Assessment and Plan: [name] KitchenMarland KitchenValisa was seen today for advice only.  Diagnoses and all orders for this visit:  Recurrent UTI -     estradiol (ESTRACE VAGINAL) 0.1 MG/GM vaginal cream; Place 1 Applicatorful vaginally 3 (three) times a week. And one pea-size amt applied to urethral opening.  Stage 3a chronic kidney disease  Centrilobular emphysema ([facility]) -     AMBULATORY NON FORMULARY MEDICATION; Portable oxygen concentrator at 2L of continuous oxygen to use with ambulation for COPD and chronic hypoxemia.  Vaccine counseling  Menopausal vaginal dryness  Irritable bowel syndrome with both constipation and diarrhea  Chronic respiratory failure with hypoxia ([facility]) -     AMBULATORY NON FORMULARY MEDICATION; Portable oxygen concentrator at 2L of continuous oxygen to use with ambulation for COPD and chronic hypoxemia.   Discussed UTI prevention.  Consider estrogen cream 3 times a week.  Use both urethra and vaginally.  Can help with vaginal dryness.   Make sure to clean after every bowel movement.  Okay to use the sterile wipes.  Do not clean multiple times a day with these wipes.  Consider daily probiotic. Discussed harder to treat mixed IbS because each treatment can cause another problem.  Watch for food triggers.  rx for O2 portable with 2L continuous.  Discussed risk versus benefit of Covid vaccine.  Patient was strongly encouraged to get Covid vaccine.  Spent a lot of time reassuring her of the efficacy and safety.  She would still like to wait another few months.  Spent 43 minutes of the phone with patient discussing concerns and giving education.     Follow Up Instructions:    I discussed the assessment and treatment plan with the patient. The patient was provided an opportunity to ask questions and all were answered. The patient agreed with the plan and demonstrated an understanding of the instructions.   The patient was advised to call back or seek an in-person evaluation if the symptoms worsen or if the condition fails to improve as anticipated.   Tandy Gaw, PA-C

## 2019-12-24 NOTE — Progress Notes (Signed)
Wanted to discuss Covid vaccine Also wants to discuss several different things

## 2019-12-25 ENCOUNTER — Other Ambulatory Visit: Payer: Self-pay | Admitting: Physician Assistant

## 2019-12-31 ENCOUNTER — Telehealth (HOSPITAL_COMMUNITY): Payer: Self-pay

## 2019-12-31 MED ORDER — CLONAZEPAM 0.5 MG PO TABS: ORAL_TABLET | ORAL | 0 refills | Status: DC

## 2019-12-31 NOTE — Telephone Encounter (Signed)
Patient needs a refill on [facility]ep[facility] [address] in [place]

## 2019-12-31 NOTE — Telephone Encounter (Signed)
sent 

## 2020-01-03 ENCOUNTER — Ambulatory Visit (INDEPENDENT_AMBULATORY_CARE_PROVIDER_SITE_OTHER): Payer: Medicare Other | Admitting: Physician Assistant

## 2020-01-03 ENCOUNTER — Encounter: Payer: Self-pay | Admitting: Physician Assistant

## 2020-01-03 VITALS — Ht 63.0 in | Wt 119.0 lb

## 2020-01-03 DIAGNOSIS — J432 Centrilobular emphysema: Secondary | ICD-10-CM

## 2020-01-03 DIAGNOSIS — J310 Chronic rhinitis: Secondary | ICD-10-CM | POA: Diagnosis not present

## 2020-01-03 DIAGNOSIS — Z7189 Other specified counseling: Secondary | ICD-10-CM | POA: Diagnosis not present

## 2020-01-03 DIAGNOSIS — R059 Cough, unspecified: Secondary | ICD-10-CM

## 2020-01-03 DIAGNOSIS — R05 Cough: Secondary | ICD-10-CM | POA: Diagnosis not present

## 2020-01-03 DIAGNOSIS — J3489 Other specified disorders of nose and nasal sinuses: Secondary | ICD-10-CM | POA: Diagnosis not present

## 2020-01-03 DIAGNOSIS — F418 Other specified anxiety disorders: Secondary | ICD-10-CM

## 2020-01-03 DIAGNOSIS — Z7185 Encounter for immunization safety counseling: Secondary | ICD-10-CM

## 2020-01-03 MED ORDER — PREDNISONE 50 MG PO TABS: ORAL_TABLET | ORAL | 0 refills | Status: DC

## 2020-01-03 MED ORDER — MOMETASONE FUROATE 50 MCG/ACT NA SUSP: NASAL | 2 refills | Status: DC

## 2020-01-03 NOTE — Progress Notes (Signed)
Patient ID: Claire [name], female   DOB: 09/24/1951, 69 y.o.   MRN: [id number] .[name] KitchenVirtual Visit via Telephone Note  I connected with NELY DEDMON on 01/03/2020 at  3:00 PM EST by telephone and verified that I am speaking with the correct person using two identifiers.  Location: Patient: home Provider: clinic   I discussed the limitations, risks, security and privacy concerns of performing an evaluation and management service by telephone and the availability of in person appointments. I also discussed with the patient that there may be a patient responsible charge related to this service. The patient expressed understanding and agreed to proceed.   History of Present Illness: Pt is a 69 yo female with emphysema who calls into the clinic with concern that her sinus drainage is going to turn into a COPD exacerbation over the weekend.   Overall she is doing ok but for the last 3 days she has had a lot more sinus drainage going down the back of her throat and runny nose. She takes zyrtec daily and been using mucinex. She has COPD and on dulera. Her sputum is clear. She continues to use albuterol 2-3 times a day and O2 only at night. flonase makes her nose too dry. She has really dry nose in am but then runs all day.   No fever, chills, body aches, wheezing, headache.    .. Active Ambulatory Problems    Diagnosis Date Noted  . PTSD (post-traumatic stress disorder) 11/10/2011  . Neurosis, anxiety, panic type 11/10/2011  . Acquired hypothyroidism 11/24/2014  . BP (high blood pressure) 07/24/2012  . Hypothyroid   . Chronic use of benzodiazepine for therapeutic purpose 08/11/2015  . Hyponatremia 12/19/2016  . Hyperkalemia 12/19/2016  . Centrilobular emphysema (HCC) 12/20/2016  . Decreased hemoglobin 12/20/2016  . CKD (chronic kidney disease) stage 3, GFR 30-59 ml/min 01/16/2017  . Iron deficiency anemia 01/16/2017  . Anemia of chronic disease 05/11/2017  . Low serum vitamin B12 05/14/2017  .  Vitamin D deficiency 05/14/2017  . Depressed mood 05/14/2017  . High serum parathyroid hormone (PTH) 07/19/2017  . Normocytic anemia 07/19/2017  . DOE (dyspnea on exertion) 05/09/2018  . Recurrent UTI 05/07/2019  . Leg cramps 05/29/2019  . No energy 05/29/2019  . History of hypokalemia 10/09/2019  . Vaccine counseling 12/24/2019  . Menopausal vaginal dryness 12/24/2019   Resolved Ambulatory Problems    Diagnosis Date Noted  . Acute renal failure (HCC) 12/19/2016  . Acute kidney injury (HCC) 03/17/2017   Past Medical History:  Diagnosis Date  . Acid reflux   . Anxiety   . High blood pressure    Reviewed med, allergy, problem list.     Observations/Objective: No acute distress. Normal breathing.  No labored breathing.  Cough on phone noted intermittently.   .. Today's Vitals   01/03/20 1454  Weight: 119 lb (54 kg)  Height: 5\' 3"  (1.6 m)   Body mass index is 21.08 kg/m.    Assessment and Plan: [name] KitchenVail was seen today for nasal congestion.  Diagnoses and all orders for this visit:  Sinus drainage -     mometasone (NASONEX) 50 MCG/ACT nasal spray; One spray in each nostril twice a day, use left hand for right nostril, and right hand for left nostril.  Please dispense one bottle. -     predniSONE (DELTASONE) 50 MG tablet; Take one tablet daily for 5 days.  Vaccine counseling  Cough -     predniSONE (DELTASONE) 50 MG tablet; Take  one tablet daily for 5 days.  Chronic rhinitis -     mometasone (NASONEX) 50 MCG/ACT nasal spray; One spray in each nostril twice a day, use left hand for right nostril, and right hand for left nostril.  Please dispense one bottle.  Centrilobular emphysema (HCC) -     predniSONE (DELTASONE) 50 MG tablet; Take one tablet daily for 5 days.  Anxiety about health   No signs of infection or COPD exacerbation. Continue zyrtec. Start ayr gel at night and nasonex in morning. Continue dulera. Due to her anxiety about COPD exacerbation and  going to hospital. Sent burst of prednisone to use of SOB/wheezing worsened. No signs of covid.   Encouraged vaccine for covid.pt is not on board and has too many concerns will continue to wait.  ..COVID-19 Vaccine Information can be found at: ShippingSc[employer].co.uk For questions related to vaccine distribution or appointments, please email [email or web address] or call [phone] [phone].      Follow Up Instructions:    I discussed the assessment and treatment plan with the patient. The patient was provided an opportunity to ask questions and all were answered. The patient agreed with the plan and demonstrated an understanding of the instructions.   The patient was advised to call back or seek an in-person evaluation if the symptoms worsen or if the condition fails to improve as anticipated.  I provided 30 minutes of non-face-to-face time during this encounter.   Iran Planas, PA-C

## 2020-01-06 ENCOUNTER — Encounter: Payer: Self-pay | Admitting: Physician Assistant

## 2020-01-13 ENCOUNTER — Ambulatory Visit (INDEPENDENT_AMBULATORY_CARE_PROVIDER_SITE_OTHER): Payer: Medicare Other | Admitting: Licensed Clinical Social Worker

## 2020-01-13 ENCOUNTER — Encounter (HOSPITAL_COMMUNITY): Payer: Self-pay | Admitting: Licensed Clinical Social Worker

## 2020-01-13 DIAGNOSIS — F411 Generalized anxiety disorder: Secondary | ICD-10-CM

## 2020-01-13 DIAGNOSIS — F4310 Post-traumatic stress disorder, unspecified: Secondary | ICD-10-CM

## 2020-01-13 NOTE — Progress Notes (Signed)
Virtual Visit via Telephone Note  I connected with Claire [name] on 01/13/20 at  2:30 PM EST by telephone and verified that I am speaking with the correct person using two identifiers.  Location: Patient: Home Provider: Office   I discussed the limitations, risks, security and privacy concerns of performing an evaluation and management service by telephone and the availability of in person appointments. I also discussed with the patient that there may be a patient responsible charge related to this service. The patient expressed understanding and agreed to proceed.    I discussed the assessment and treatment plan with the patient. The patient was provided an opportunity to ask questions and all were answered. The patient agreed with the plan and demonstrated an understanding of the instructions.   The patient was advised to call back or seek an in-person evaluation if the symptoms worsen or if the condition fails to improve as anticipated.  I provided 50 minutes of non-face-to-face time during this encounter.   Margo Common, LCAS-A     THERAPIST PROGRESS NOTE  Session Time: 230-320  Participation Level: Active  Behavioral Response: NAAlertAnxious  Type of Therapy: Individual Therapy  Treatment Goals addressed: Anxiety  Interventions: CBT and Supportive  Summary: Claire [name] is a 69 y.o. female who presents with hx of anxiety and PTSD sxs. She reports she has recently been arguing w/ her landlord about the condition of the property she lives in and is having her daughter work w/ landlord to fix electrical issues that are causing her suffering and creating tripping hazards in the house. I listen to PT's issues while offering supportive encouragement and problem solving skills. I inform PT about my move to private practice and she is agreeable to this though she wants to continue to meet me if insurance covers her.    Suicidal/Homicidal: Nowithout intent/plan  Therapist  Response: I used open questions, active listening, and supportive encouragement.   Plan: F/U as needed.   Diagnosis:    ICD-10-CM   1. PTSD (post-traumatic stress disorder)  F43.10   2. GAD (generalized anxiety disorder)  F41.1      Margo Common, LCAS-A 01/13/2020

## 2020-01-28 ENCOUNTER — Telehealth (HOSPITAL_COMMUNITY): Payer: Self-pay | Admitting: Psychiatry

## 2020-01-28 MED ORDER — CLONAZEPAM 0.5 MG PO TABS: ORAL_TABLET | ORAL | 0 refills | Status: DC

## 2020-01-28 NOTE — Telephone Encounter (Signed)
Pt needs refill on klonopin [facility]

## 2020-01-29 ENCOUNTER — Telehealth (INDEPENDENT_AMBULATORY_CARE_PROVIDER_SITE_OTHER): Payer: Medicare Other | Admitting: Physician Assistant

## 2020-01-29 VITALS — Ht 63.0 in | Wt 119.0 lb

## 2020-01-29 DIAGNOSIS — J432 Centrilobular emphysema: Secondary | ICD-10-CM | POA: Diagnosis not present

## 2020-01-29 DIAGNOSIS — J310 Chronic rhinitis: Secondary | ICD-10-CM

## 2020-01-29 DIAGNOSIS — J9611 Chronic respiratory failure with hypoxia: Secondary | ICD-10-CM

## 2020-01-29 MED ORDER — FEXOFENADINE HCL 60 MG PO TABS: 60.0000 mg | ORAL_TABLET | Freq: Two times a day (BID) | ORAL | 1 refills | Status: DC

## 2020-01-29 NOTE — Progress Notes (Signed)
Patient ID: Claire [name], female   DOB: [birth year], 69 y.o.   MRN: [id number] .[name] KitchenVirtual Visit via Telephone Note  I connected with GIULIA HICKEY on 01/31/20 at  2:00 PM EST by telephone and verified that I am speaking with the correct person using two identifiers.  Location: Patient: home Provider: clinic   I discussed the limitations, risks, security and privacy concerns of performing an evaluation and management service by telephone and the availability of in person appointments. I also discussed with the patient that there may be a patient responsible charge related to this service. The patient expressed understanding and agreed to proceed.   History of Present Illness: Pt is a 69 yo female with chronic respiratory failure, COPD, seasonal allergies who presents to the clinic to discuss ongoing runny nose and PND. She denies any problems breathing. Her cough is mildly productive in am. She is taking mucinex. She just has lots of drainage and her nose runs. She does not like nasal sprays. She is taking zyrtec daily. She uses a air humidifer.    .. Active Ambulatory Problems    Diagnosis Date Noted  . PTSD (post-traumatic stress disorder) 11/10/2011  . Neurosis, anxiety, panic type 11/10/2011  . Acquired hypothyroidism 11/24/2014  . BP (high blood pressure) 07/24/2012  . Hypothyroid   . Chronic use of benzodiazepine for therapeutic purpose 08/11/2015  . Hyponatremia 12/19/2016  . Hyperkalemia 12/19/2016  . Centrilobular emphysema (HCC) 12/20/2016  . Decreased hemoglobin 12/20/2016  . CKD (chronic kidney disease) stage 3, GFR 30-59 ml/min 01/16/2017  . Iron deficiency anemia 01/16/2017  . Anemia of chronic disease 05/11/2017  . Low serum vitamin B12 05/14/2017  . Vitamin D deficiency 05/14/2017  . Depressed mood 05/14/2017  . High serum parathyroid hormone (PTH) 07/19/2017  . Normocytic anemia 07/19/2017  . DOE (dyspnea on exertion) 05/09/2018  . Recurrent UTI 05/07/2019  . Leg  cramps 05/29/2019  . No energy 05/29/2019  . History of hypokalemia 10/09/2019  . Vaccine counseling 12/24/2019  . Menopausal vaginal dryness 12/24/2019  . Chronic rhinitis 01/31/2020  . Chronic respiratory failure with hypoxia (HCC) 01/31/2020   Resolved Ambulatory Problems    Diagnosis Date Noted  . Acute renal failure (HCC) 12/19/2016  . Acute kidney injury (HCC) 03/17/2017   Past Medical History:  Diagnosis Date  . Acid reflux   . Anxiety   . High blood pressure    reviewed med, allergy, problem list.     Observations/Objective:   Assessment and Plan: [name] KitchenMarland KitchenLasheba was seen today for cough.  Diagnoses and all orders for this visit:  Chronic rhinitis -     fexofenadine (ALLEGRA ALLERGY) 60 MG tablet; Take 1 tablet (60 mg total) by mouth 2 (two) times daily.  Centrilobular emphysema (HCC)  Chronic respiratory failure with hypoxia (HCC)   Stop zyrtec. Start allegra. Continue to use nasal saline. Continue mucinex. Consider ENT vs allergy referral in the future.  Pt does not tolerate singulair.  Reassured does not need prednisone for this.  Follow Up Instructions:    I discussed the assessment and treatment plan with the patient. The patient was provided an opportunity to ask questions and all were answered. The patient agreed with the plan and demonstrated an understanding of the instructions.   The patient was advised to call back or seek an in-person evaluation if the symptoms worsen or if the condition fails to improve as anticipated.  I provided 30 minutes of non-face-to-face time during this encounter.   Tandy Gaw,  PA-C

## 2020-01-29 NOTE — Patient Instructions (Signed)
Nonallergic Rhinitis Nonallergic rhinitis is a condition that causes symptoms that affect the nose, such as a runny nose and a stuffed-up nose (nasal congestion) that can make it hard to breathe through the nose. This condition is different from having an allergy (allergic rhinitis). Allergic rhinitis occurs when the body's defense system (immune system) reacts to a substance that you are allergic to (allergen), such as pollen, pet dander, mold, or dust. Nonallergic rhinitis has many similar symptoms, but it is not caused by allergens. Nonallergic rhinitis can be a short-term or long-term problem. What are the causes? This condition can be caused by many different things. Some common types of nonallergic rhinitis include: Infectious rhinitis  This is usually due to an infection in the upper respiratory tract. Vasomotor rhinitis  This is the most common type of long-term nonallergic rhinitis.  It is caused by too much blood flow through the nose, which makes the tissue inside of the nose swell.  Symptoms are often triggered by strong odors, cold air, stress, drinking alcohol, cigarette smoke, or changes in the weather. Occupational rhinitis  This type is caused by triggers in the workplace, such as chemicals, dusts, animal dander, or air pollution. Hormonal rhinitis  This type occurs in women as a result of an increase in the female hormone estrogen.  It may occur during pregnancy, puberty, and menstrual cycles.  Symptoms improve when estrogen levels drop. Drug-induced rhinitis Several drugs can cause nonallergic rhinitis, including:  Medicines that are used to treat high blood pressure, heart disease, and Parkinson disease.  Aspirin and NSAIDs.  Over-the-counter nasal decongestant sprays. These can cause a type of nonallergic rhinitis (rhinitis medicamentosa) when they are used for more than a few days. Nonallergic rhinitis with eosinophilia syndrome (NARES)  This type is caused by  having too much of a certain type of white blood cell (eosinophil). Nonallergic rhinitis can also be caused by a reaction to eating hot or spicy foods. This does not usually cause long-term symptoms. In some cases, the cause of nonallergic rhinitis is not known. What increases the risk? You are more likely to develop this condition if:  You are 30-60 years of age.  You are a woman. Women are twice as likely to have this condition. What are the signs or symptoms? Common symptoms of this condition include:  Nasal congestion.  Runny nose.  The feeling of mucus going down the back of the throat (postnasal drip).  Trouble sleeping at night and daytime sleepiness. Less common symptoms include:  Sneezing.  Coughing.  Itchy nose.  Bloodshot eyes. How is this diagnosed? This condition may be diagnosed based on:  Your symptoms and medical history.  A physical exam.  Allergy testing to rule out allergic rhinitis. You may have skin tests or blood tests. In some cases, the health care provider may take a swab of nasal secretions to look for an increased number of eosinophils. This would be done to confirm a diagnosis of NARES. How is this treated? Treatment for this condition depends on the cause. No single treatment works for everyone. Work with your health care provider to find the best treatment for you. Treatment may include:  Avoiding the things that trigger your symptoms.  Using medicines to relieve congestion, such as: ? Steroid nasal spray. There are many types. You may need to try a few to find out which one works best. ? Decongestant medicine. This may be an oral medicine or a nasal spray. These medicines are only used for   a short time.  Using medicines to relieve a runny nose. These may include antihistamine medicines or anticholinergic nasal sprays.  Surgery to remove tissue from inside the nose may be needed in severe cases if the condition has not improved after 6-12  months of medical treatment. Follow these instructions at home:  Take or use over-the-counter and prescription medicines only as told by your health care provider. Do not stop using your medicine even if you start to feel better.  Use salt-water (saline) rinses or other solutions (nasal washes or irrigations) to wash or rinse out the inside of your nose as told by your health care provider.  Do not take NSAIDs or medicines that contain aspirin if they make your symptoms worse.  Do not drink alcohol if it makes your symptoms worse.  Do not use any tobacco products, such as cigarettes, chewing tobacco, and e-cigarettes. If you need help quitting, ask your health care provider.  Avoid secondhand smoke.  Get some exercise every day. Exercise may help reduce symptoms of nonallergic rhinitis for some people. Ask your health care provider how much exercise and what types of exercise are safe for you.  Sleep with the head of your bed raised (elevated). This may reduce nighttime nasal congestion.  Keep all follow-up visits as told by your health care provider. This is important. Contact a health care provider if:  You have a fever.  Your symptoms are getting worse at home.  Your symptoms are not responding to medicine.  You develop new symptoms, especially a headache or nosebleed. This information is not intended to replace advice given to you by your health care provider. Make sure you discuss any questions you have with your health care provider. Document Revised: 11/03/2017 Document Reviewed: 02/11/2016 Elsevier Patient Education  2020 Elsevier Inc.  

## 2020-01-31 ENCOUNTER — Encounter: Payer: Self-pay | Admitting: Physician Assistant

## 2020-01-31 ENCOUNTER — Telehealth: Payer: Self-pay | Admitting: Physician Assistant

## 2020-01-31 DIAGNOSIS — J9611 Chronic respiratory failure with hypoxia: Secondary | ICD-10-CM | POA: Insufficient documentation

## 2020-01-31 DIAGNOSIS — J310 Chronic rhinitis: Secondary | ICD-10-CM | POA: Insufficient documentation

## 2020-01-31 NOTE — Telephone Encounter (Signed)
PT requested antibiotic to be sent in. She ask for you specifically. I suggested an appointment.

## 2020-01-31 NOTE — Telephone Encounter (Signed)
Patient states nose drainage is clear but when she coughs things up, it is yellow. She is scared too much mucous is getting into her chest

## 2020-01-31 NOTE — Telephone Encounter (Signed)
Increased and even colored mucus is not always a sign of infection. A day or so of mucus exacerbation is not infection. You keep some level of mucus with COPD. Keep drinking water and take mucinex and your allegra. Increase nebulizer if you need to as well. If having more problems breathing prednisone would do you better to start than abx.

## 2020-01-31 NOTE — Telephone Encounter (Signed)
Can we call patient. I just spoke with her and was clear drainage from nose. No signs that she needed an abx. Have things changed?

## 2020-02-03 NOTE — Telephone Encounter (Signed)
Patient advised.

## 2020-02-18 ENCOUNTER — Ambulatory Visit (HOSPITAL_COMMUNITY): Payer: Medicare Other | Admitting: Psychiatry

## 2020-02-18 ENCOUNTER — Telehealth (INDEPENDENT_AMBULATORY_CARE_PROVIDER_SITE_OTHER): Payer: Medicare Other | Admitting: Physician Assistant

## 2020-02-18 VITALS — Ht 63.0 in | Wt 119.0 lb

## 2020-02-18 DIAGNOSIS — Z7189 Other specified counseling: Secondary | ICD-10-CM

## 2020-02-18 DIAGNOSIS — E039 Hypothyroidism, unspecified: Secondary | ICD-10-CM | POA: Diagnosis not present

## 2020-02-18 DIAGNOSIS — D509 Iron deficiency anemia, unspecified: Secondary | ICD-10-CM

## 2020-02-18 DIAGNOSIS — J432 Centrilobular emphysema: Secondary | ICD-10-CM

## 2020-02-18 DIAGNOSIS — Z13220 Encounter for screening for lipoid disorders: Secondary | ICD-10-CM

## 2020-02-18 DIAGNOSIS — D638 Anemia in other chronic diseases classified elsewhere: Secondary | ICD-10-CM

## 2020-02-18 DIAGNOSIS — J9611 Chronic respiratory failure with hypoxia: Secondary | ICD-10-CM | POA: Diagnosis not present

## 2020-02-18 DIAGNOSIS — N1831 Chronic kidney disease, stage 3a: Secondary | ICD-10-CM

## 2020-02-18 DIAGNOSIS — R634 Abnormal weight loss: Secondary | ICD-10-CM | POA: Diagnosis not present

## 2020-02-18 DIAGNOSIS — Z7185 Encounter for immunization safety counseling: Secondary | ICD-10-CM

## 2020-02-18 NOTE — Progress Notes (Signed)
Patient ID: Claire [name], female   DOB: 16-Nov-1951, 69 y.o.   MRN: [id number] .[name] KitchenVirtual Visit via Telephone Note  I connected with Claire [name] on 02/18/20 at  1:20 PM EDT by telephone and verified that I am speaking with the correct person using two identifiers.  Location: Patient: home Provider: clinic   I discussed the limitations, risks, security and privacy concerns of performing an evaluation and management service by telephone and the availability of in person appointments. I also discussed with the patient that there may be a patient responsible charge related to this service. The patient expressed understanding and agreed to proceed.   History of Present Illness: Pt is a 69 yo female who is very anxious and has some questions and concerns.   Pt does have chronic respiratory failure, HTN, hypothyroidism, CKD, anemia of chronic disease, MDD and GAD.   She has not had labs drawn in a while. She is concerned about her potassium she has not been as strict with her diet and wonders if it is elevated.   Her breathing is stable. She continues to use O2 regularly. She continues to smoke 2 cigs a day. She uses spiriva and dulera daily.   She needs letter to keep her dog when she moves into new apartment. She needs her dog for support. She is inside most of the time alone. She is very scared concnerning covid.   She would like to discuss vaccine again.   .. Active Ambulatory Problems    Diagnosis Date Noted  . PTSD (post-traumatic stress disorder) 11/10/2011  . Neurosis, anxiety, panic type 11/10/2011  . Acquired hypothyroidism 11/24/2014  . BP (high blood pressure) 07/24/2012  . Chronic use of benzodiazepine for therapeutic purpose 08/11/2015  . Hyponatremia 12/19/2016  . Hyperkalemia 12/19/2016  . Centrilobular emphysema ([place]) 12/20/2016  . Decreased hemoglobin 12/20/2016  . CKD (chronic kidney disease) stage 3, GFR 30-59 ml/min 01/16/2017  . Iron deficiency anemia 01/16/2017   . Anemia of chronic disease 05/11/2017  . Low serum vitamin B12 05/14/2017  . Vitamin D deficiency 05/14/2017  . Depressed mood 05/14/2017  . High serum parathyroid hormone (PTH) 07/19/2017  . Normocytic anemia 07/19/2017  . DOE (dyspnea on exertion) 05/09/2018  . Recurrent UTI 05/07/2019  . Leg cramps 05/29/2019  . No energy 05/29/2019  . History of hypokalemia 10/09/2019  . Vaccine counseling 12/24/2019  . Menopausal vaginal dryness 12/24/2019  . Chronic rhinitis 01/31/2020  . Chronic respiratory failure with hypoxia ([place]) 01/31/2020  . Weight loss 02/19/2020   Resolved Ambulatory Problems    Diagnosis Date Noted  . Hypothyroid   . Acute renal failure ([place]) 12/19/2016  . Acute kidney injury ([place]) 03/17/2017   Past Medical History:  Diagnosis Date  . Acid reflux   . Anxiety   . High blood pressure    Reviewed med, allergy, problem list.    Observations/Objective: No acute distress Anxious mood.   .. Today's Vitals   02/18/20 1053  Weight: 119 lb (54 kg)  Height: 5\' 3"  (1.6 m)   Body mass index is 21.08 kg/m.    Assessment and Plan: [name] KitchenMarland KitchenTeresha was seen today for follow-up.  Diagnoses and all orders for this visit:  Weight loss -     CBC -     Fe+TIBC+Fer  Centrilobular emphysema (HCC)  Chronic respiratory failure with hypoxia (HCC) -     COMPLETE METABOLIC PANEL WITH GFR  Acquired hypothyroidism -     TSH  Stage 3a chronic kidney  disease -     COMPLETE METABOLIC PANEL WITH GFR  Iron deficiency anemia, unspecified iron deficiency anemia type -     COMPLETE METABOLIC PANEL WITH GFR -     CBC -     Fe+TIBC+Fer  Vaccine counseling  Anemia of chronic disease -     COMPLETE METABOLIC PANEL WITH GFR -     CBC -     Fe+TIBC+Fer  Screening for lipid disorders -     Lipid Panel w/reflex Direct LDL   -will get labs ordered and scheduled for blood draw in her car.  -will get letter for emotional support dog.  -discussed vaccine and strongly  encouraged it.    Follow Up Instructions:    I discussed the assessment and treatment plan with the patient. The patient was provided an opportunity to ask questions and all were answered. The patient agreed with the plan and demonstrated an understanding of the instructions.   The patient was advised to call back or seek an in-person evaluation if the symptoms worsen or if the condition fails to improve as anticipated.  I provided 40 minutes of non-face-to-face time during this encounter.   Tandy Gaw, PA-C

## 2020-02-19 ENCOUNTER — Encounter: Payer: Self-pay | Admitting: Physician Assistant

## 2020-02-19 ENCOUNTER — Other Ambulatory Visit: Payer: Self-pay | Admitting: Physician Assistant

## 2020-02-19 DIAGNOSIS — R634 Abnormal weight loss: Secondary | ICD-10-CM | POA: Insufficient documentation

## 2020-02-19 DIAGNOSIS — J432 Centrilobular emphysema: Secondary | ICD-10-CM

## 2020-02-20 ENCOUNTER — Encounter (HOSPITAL_COMMUNITY): Payer: Self-pay | Admitting: Psychiatry

## 2020-02-20 ENCOUNTER — Ambulatory Visit (INDEPENDENT_AMBULATORY_CARE_PROVIDER_SITE_OTHER): Payer: Medicare Other | Admitting: Psychiatry

## 2020-02-20 DIAGNOSIS — F411 Generalized anxiety disorder: Secondary | ICD-10-CM | POA: Diagnosis not present

## 2020-02-20 DIAGNOSIS — F410 Panic disorder [episodic paroxysmal anxiety] without agoraphobia: Secondary | ICD-10-CM | POA: Diagnosis not present

## 2020-02-20 DIAGNOSIS — F4310 Post-traumatic stress disorder, unspecified: Secondary | ICD-10-CM

## 2020-02-20 MED ORDER — CLONAZEPAM 0.5 MG PO TABS: ORAL_TABLET | ORAL | 2 refills | Status: DC

## 2020-02-20 NOTE — Progress Notes (Signed)
Patient ID: Claire [name], female   DOB: 05-Oct-1951, 69 y.o.   MRN: [id number]  [facility] Outpatient Follow up visit [address] [phone] 69 y.o.  02/20/2020 2:38 PM  Chief Complaint:  Anxiety follow up, med review  History of Present Illness:    I connected with Carl Best on 02/20/20 at  2:30 PM EDT by telephone and verified that I am speaking with the correct person using two identifiers.  I discussed the limitations, risks, security and privacy concerns of performing an evaluation and management service by telephone and the availability of in person appointments. I also discussed with the patient that there may be a patient responsible charge related to this service. The patient expressed understanding and agreed to proceed.  Doing fair since living seperately from daughter  wants to continue klonopine otherwise gets anxiuos Moving to another apartment and looking forward Modifying factor: breahting techniques. grandkids   PTSD is related to her past some flashbacks. Distraction helps  Depression not worse No side effects     Aggravating factors: past childhood,  No psychosis  Medical History; Past Medical History:  Diagnosis Date  . Acid reflux   . Anxiety   . High blood pressure   . High serum parathyroid hormone (PTH) 07/19/2017   96 06/28/2014, 162 12/29/16  . Hypothyroid   . PTSD (post-traumatic stress disorder)     Allergies: Allergies  Allergen Reactions  . Bactrim [Sulfamethoxazole-Trimethoprim]     ACUTE RENAL fAILURE/HYPONATREMIA/HYPERKALEMIA  . Macrodantin [Nitrofurantoin Macrocrystal] Rash  . Trimethoprim     Fatigue/weakness/no appetite  . Zithromax [Azithromycin]     diarrhea    Medications: Outpatient Encounter Medications as of 02/20/2020  Medication Sig  . albuterol (PROVENTIL) (2.5 MG/3ML) 0.083% nebulizer solution Take 3 mLs (2.5 mg total) by nebulization every 4 (four) hours as needed for wheezing or  shortness of breath.  . AMBULATORY NON FORMULARY MEDICATION Continuous stationary and portable oxygen tanks for use with ambulation. DX: COPD with hypoxia.  . AMBULATORY NON FORMULARY MEDICATION Home concentrator and one Imogen G3 portable oxygen concentrator for hypoxia.  . AMBULATORY NON FORMULARY MEDICATION Portable oxygen compressor device for portable O2 administration continuous 2L/min while walking. Dx COPD w/ hypoxia. Titrate for OCD (oxygen conserving device).  . AMBULATORY NON FORMULARY MEDICATION One pulse ox to manage SOB and O2 use.  . AMBULATORY NON FORMULARY MEDICATION Portable oxygen concentrator at 2L of continuous oxygen to use with ambulation for COPD and chronic hypoxemia.  [name] atenolol (TENORMIN) 25 MG tablet TAKE 1 TABLET BY MOUTH EVERY DAY  . cetirizine (ZYRTEC) 10 MG chewable tablet Chew 10 mg by mouth daily.  . cholecalciferol (VITAMIN D) 25 MCG (1000 UT) tablet Take 1,000 Units by mouth daily.  . clonazePAM (KLONOPIN) 0.5 MG tablet TAKE 1 TABLET BY MOUTH 3 TIMES A DAY  . dextromethorphan-guaiFENesin (MUCINEX DM) 30-600 MG 12hr tablet Take 1 tablet by mouth 2 (two) times daily.  . DULERA 100-5 MCG/ACT AERO TAKE 2 PUFFS BY MOUTH TWICE A DAY  . estradiol (ESTRACE VAGINAL) 0.1 MG/GM vaginal cream Place 1 Applicatorful vaginally 3 (three) times a week. And one pea-size amt applied to urethral opening.  . famotidine (PEPCID) 20 MG tablet TAKE 1 TABLET BY MOUTH TWICE A DAY  . ferrous sulfate 325 (65 FE) MG EC tablet 1 tab PO TID every other day  . fexofenadine (ALLEGRA ALLERGY) 60 MG tablet Take 1 tablet (60 mg total) by mouth 2 (two) times daily.  [name]  levothyroxine (SYNTHROID) 75 MCG tablet TAKE 1 TABLET BY MOUTH EVERY DAY  . Magnesium 250 MG TABS Take 250 mg by mouth. Take one tablet daily  . mometasone (NASONEX) 50 MCG/ACT nasal spray One spray in each nostril twice a day, use left hand for right nostril, and right hand for left nostril.  Please dispense one bottle.  [name] PROAIR HFA  108 (90 Base) MCG/ACT inhaler TAKE 2 PUFFS BY MOUTH EVERY 6 HOURS AS NEEDED FOR WHEEZE OR SHORTNESS OF BREATH  . SPIRIVA RESPIMAT 2.5 MCG/ACT AERS TAKE 2 PUFFS ONCE A DAY.  . [DISCONTINUED] clonazePAM (KLONOPIN) 0.5 MG tablet TAKE 1 TABLET BY MOUTH 3 TIMES A DAY   No facility-administered encounter medications on file as of 02/20/2020.    Family History; Family History  Problem Relation Age of Onset  . Anxiety disorder Mother   . OCD Daughter   . Alcohol abuse Father   . Anxiety disorder Sister        Labs:  Recent Results (from the past 2160 hour(s))  Urinalysis, Routine w reflex microscopic     Status: Abnormal   Collection Time: 12/02/19 10:36 AM  Result Value Ref Range   Color, Urine YELLOW YELLOW   APPearance CLOUDY (A) CLEAR   Specific Gravity, Urine 1.011 1.001 - 1.03   pH 6.0 5.0 - 8.0   Glucose, UA NEGATIVE NEGATIVE   Bilirubin Urine NEGATIVE NEGATIVE   Ketones, ur NEGATIVE NEGATIVE   Hgb urine dipstick TRACE (A) NEGATIVE   Protein, ur NEGATIVE NEGATIVE   Nitrite POSITIVE (A) NEGATIVE   Leukocytes,Ua 3+ (A) NEGATIVE   WBC, UA > OR = 60 (A) 0 - 5 /HPF   RBC / HPF NONE SEEN 0 - 2 /HPF   Squamous Epithelial / LPF NONE SEEN < OR = 5 /HPF   Bacteria, UA MODERATE (A) NONE SEEN /HPF   Hyaline Cast NONE SEEN NONE SEEN /LPF  Urine Culture     Status: Abnormal   Collection Time: 12/02/19 10:36 AM   Specimen: Urine  Result Value Ref Range   MICRO NUMBER: [id number]    SPECIMEN QUALITY: Adequate    Sample Source NOT GIVEN    STATUS: FINAL    ISOLATE 1: Escherichia coli (A)     Comment: Greater than 100,000 CFU/mL of Escherichia coli      Susceptibility   Escherichia coli - URINE CULTURE, REFLEX    AMOX/CLAVULANIC 4 Sensitive     AMPICILLIN >=32 Resistant     AMPICILLIN/SULBACTAM 16 Intermediate     CEFAZOLIN* <=4 Not Reportable      * For infections other than uncomplicated UTIcaused by E. coli, K. pneumoniae or P. mirabilis:Cefazolin is resistant if MIC > or = 8  mcg/mL.(Distinguishing susceptible versus intermediatefor isolates with MIC < or = 4 mcg/mL requiresadditional testing.)For uncomplicated UTI caused by E. coli,K. pneumoniae or P. mirabilis: Cefazolin issusceptible if MIC <32 mcg/mL and predictssusceptible to the oral agents cefaclor, cefdinir,cefpodoxime, cefprozil, cefuroxime, cephalexinand loracarbef.    CEFEPIME <=1 Sensitive     CEFTRIAXONE <=1 Sensitive     CIPROFLOXACIN >=4 Resistant     LEVOFLOXACIN >=8 Resistant     ERTAPENEM <=0.5 Sensitive     GENTAMICIN <=1 Sensitive     IMIPENEM <=0.25 Sensitive     NITROFURANTOIN <=16 Sensitive     PIP/TAZO <=4 Sensitive     TOBRAMYCIN <=1 Sensitive     TRIMETH/SULFA* <=20 Sensitive      * For infections other than uncomplicated UTIcaused by E.  coli, K. pneumoniae or P. mirabilis:Cefazolin is resistant if MIC > or = 8 mcg/mL.(Distinguishing susceptible versus intermediatefor isolates with MIC < or = 4 mcg/mL requiresadditional testing.)For uncomplicated UTI caused by E. coli,K. pneumoniae or P. mirabilis: Cefazolin issusceptible if MIC <32 mcg/mL and predictssusceptible to the oral agents cefaclor, cefdinir,cefpodoxime, cefprozil, cefuroxime, cephalexinand loracarbef.Legend:S = Susceptible  I = IntermediateR = Resistant  NS = Not susceptible* = Not tested  NR = Not reported**NN = See antimicrobic comments      Mental Status Examination;   Psychiatric Specialty Exam: Physical Exam  Review of Systems  Cardiovascular: Negative for chest pain.  Psychiatric/Behavioral: Negative for depression and suicidal ideas.    There were no vitals taken for this visit.There is no height or weight on file to calculate BMI.  General Appearance:   Eye Contact::    Speech:  Slow  Volume:  Decreased  Mood: fair  Affect:  congruent  Thought Process:  Coherent and intact  Orientation:  Full (Time, Place, and Person)  Thought Content:  Rumination  Suicidal Thoughts:  No  Homicidal Thoughts:  No  Memory:   Immediate;   Fair Recent;   Fair  Judgement:  Fair  Insight:  Shallow  Psychomotor Activity:  Normal  Concentration:  Fair  Recall:  Fair  Akathisia:  Negative  Handed:  Right  AIMS (if indicated):     Assets:  Social Support Vocational/Educational  Sleep:        Assessment: Axis I: Panic disorder. Possible PTSD. Rule out generalized anxiety disorder. Nicotine dependence  Axis II: Deferred  Axis III:  Past Medical History:  Diagnosis Date  . Acid reflux   . Anxiety   . High blood pressure   . High serum parathyroid hormone (PTH) 07/19/2017   96 06/28/2014, 162 12/29/16  . Hypothyroid   . PTSD (post-traumatic stress disorder)     Axis IV: Psychosocial. History of trauma   Treatment Plan and Summary:  Anxiety and GAD: doing fair continue klonopine discussed risks   PTSD: baseline. continuie disraction techniques Does not want to be on sSRi  But just klonopine    I discussed the assessment and treatment plan with the patient. The patient was provided an opportunity to ask questions and all were answered. The patient agreed with the plan and demonstrated an understanding of the instructions.   The patient was advised to call back or seek an in-person evaluation if the symptoms worsen or if the condition fails to improve as anticipated.  I provided 15 minutes of non-face-to-face time during this encounter. Fu 1m.   Thresa Ross, MD 02/20/2020

## 2020-03-02 NOTE — Addendum Note (Signed)
Addended by: Jed Limerick on: 03/02/2020 03:32 PM   Modules accepted: Orders

## 2020-03-04 ENCOUNTER — Encounter: Payer: Self-pay | Admitting: Physician Assistant

## 2020-03-04 ENCOUNTER — Other Ambulatory Visit: Payer: Self-pay | Admitting: Neurology

## 2020-03-04 DIAGNOSIS — R7989 Other specified abnormal findings of blood chemistry: Secondary | ICD-10-CM

## 2020-03-04 DIAGNOSIS — D509 Iron deficiency anemia, unspecified: Secondary | ICD-10-CM

## 2020-03-04 LAB — COMPREHENSIVE METABOLIC PANEL
ALT: 7 IU/L (ref 0–32)
AST: 13 IU/L (ref 0–40)
Albumin/Globulin Ratio: 1.7 (ref 1.2–2.2)
Albumin: 4.3 g/dL (ref 3.8–4.8)
Alkaline Phosphatase: 57 IU/L (ref 39–117)
BUN/Creatinine Ratio: 12 (ref 12–28)
BUN: 14 mg/dL (ref 8–27)
Bilirubin Total: 0.2 mg/dL (ref 0.0–1.2)
CO2: 30 mmol/L — ABNORMAL HIGH (ref 20–29)
Calcium: 9.5 mg/dL (ref 8.7–10.3)
Chloride: 98 mmol/L (ref 96–106)
Creatinine, Ser: 1.13 mg/dL — ABNORMAL HIGH (ref 0.57–1.00)
GFR calc Af Amer: 57 mL/min/{1.73_m2} — ABNORMAL LOW (ref >59)
GFR calc non Af Amer: 50 mL/min/{1.73_m2} — ABNORMAL LOW (ref >59)
Globulin, Total: 2.5 g/dL (ref 1.5–4.5)
Glucose: 85 mg/dL (ref 65–99)
Potassium: 4.2 mmol/L (ref 3.5–5.2)
Sodium: 140 mmol/L (ref 134–144)
Total Protein: 6.8 g/dL (ref 6.0–8.5)

## 2020-03-04 LAB — CBC
Hematocrit: 26.3 % — ABNORMAL LOW (ref 34.0–46.6)
Hemoglobin: 8.9 g/dL — ABNORMAL LOW (ref 11.1–15.9)
MCH: 33.6 pg — ABNORMAL HIGH (ref 26.6–33.0)
MCHC: 33.8 g/dL (ref 31.5–35.7)
MCV: 99 fL — ABNORMAL HIGH (ref 79–97)
Platelets: 232 10*3/uL (ref 150–450)
RBC: 2.65 x10E6/uL — CL (ref 3.77–5.28)
RDW: 11 % — ABNORMAL LOW (ref 11.7–15.4)
WBC: 5.9 10*3/uL (ref 3.4–10.8)

## 2020-03-04 LAB — IRON,TIBC AND FERRITIN PANEL
Ferritin: 239 ng/mL — ABNORMAL HIGH (ref 15–150)
Iron Saturation: 32 % (ref 15–55)
Iron: 72 ug/dL (ref 27–139)
Total Iron Binding Capacity: 226 ug/dL — ABNORMAL LOW (ref 250–450)
UIBC: 154 ug/dL (ref 118–369)

## 2020-03-04 LAB — LIPID PANEL
Chol/HDL Ratio: 3.2 ratio (ref 0.0–4.4)
Cholesterol, Total: 174 mg/dL (ref 100–199)
HDL: 55 mg/dL (ref >39)
LDL Chol Calc (NIH): 105 mg/dL — ABNORMAL HIGH (ref 0–99)
Triglycerides: 75 mg/dL (ref 0–149)
VLDL Cholesterol Cal: 14 mg/dL (ref 5–40)

## 2020-03-04 LAB — TSH: TSH: 1.53 u[IU]/mL (ref 0.450–4.500)

## 2020-03-04 NOTE — Progress Notes (Signed)
Claire [name],   Your hemoglobin and RBC formation is low but iron stores are elevated. Your platelets are great. I would like you to go to hematology for their consult. I suspect some element of anemia or chronic disease. Are you ok with this?   Claire [name], if we could add peripheral smear as well to CBC.  Kidney function is stable. Potassium GREAT.  CO2 is up some but YOU NEED to be using your oxygen.  Thyroid is good.

## 2020-03-05 ENCOUNTER — Telehealth: Payer: Self-pay | Admitting: Neurology

## 2020-03-05 ENCOUNTER — Telehealth: Payer: Self-pay | Admitting: Hematology & Oncology

## 2020-03-05 LAB — PATHOLOGIST SMEAR REVIEW
Basophils Absolute: 0 10*3/uL (ref 0.0–0.2)
Basos: 1 %
EOS (ABSOLUTE): 0.1 10*3/uL (ref 0.0–0.4)
Eos: 2 %
Hematocrit: 26.3 % — ABNORMAL LOW (ref 34.0–46.6)
Hemoglobin: 8.9 g/dL — ABNORMAL LOW (ref 11.1–15.9)
Immature Grans (Abs): 0 10*3/uL (ref 0.0–0.1)
Immature Granulocytes: 0 %
Lymphocytes Absolute: 1.4 10*3/uL (ref 0.7–3.1)
Lymphs: 24 %
MCH: 33.6 pg — ABNORMAL HIGH (ref 26.6–33.0)
MCHC: 33.8 g/dL (ref 31.5–35.7)
MCV: 99 fL — ABNORMAL HIGH (ref 79–97)
Monocytes Absolute: 0.5 10*3/uL (ref 0.1–0.9)
Monocytes: 9 %
Neutrophils Absolute: 3.8 10*3/uL (ref 1.4–7.0)
Neutrophils: 64 %
Path Rev PLTs: NORMAL
Path Rev WBC: NORMAL
Platelets: 232 10*3/uL (ref 150–450)
RBC: 2.65 x10E6/uL — CL (ref 3.77–5.28)
RDW: 11 % — ABNORMAL LOW (ref 11.7–15.4)
WBC: 5.9 10*3/uL (ref 3.4–10.8)

## 2020-03-05 LAB — SPECIMEN STATUS REPORT

## 2020-03-05 NOTE — Telephone Encounter (Signed)
All recent labs sent to Marcelline Deist, MD, Nephrologist per patient request.

## 2020-03-05 NOTE — Telephone Encounter (Signed)
Already spoke with patient on the phone re: this and answered all her questions.

## 2020-03-05 NOTE — Telephone Encounter (Signed)
Called and spoke with patient regarding New Patient appointment w/ Dr Myna Hidalgo.  I provided her with all the details of this appointment,.  I have also mailed a letter & calendar

## 2020-03-09 ENCOUNTER — Other Ambulatory Visit: Payer: Self-pay | Admitting: Neurology

## 2020-03-09 DIAGNOSIS — R7989 Other specified abnormal findings of blood chemistry: Secondary | ICD-10-CM

## 2020-03-09 DIAGNOSIS — D509 Iron deficiency anemia, unspecified: Secondary | ICD-10-CM

## 2020-03-09 NOTE — Progress Notes (Signed)
Ok to leave orders up front for [facility].

## 2020-03-09 NOTE — Progress Notes (Signed)
Smear showed normal appearance.  I would repeat CBC to make sure confirm correct results. Need to also check B12/Folate.

## 2020-03-10 ENCOUNTER — Encounter: Payer: Self-pay | Admitting: Physician Assistant

## 2020-03-10 ENCOUNTER — Telehealth (INDEPENDENT_AMBULATORY_CARE_PROVIDER_SITE_OTHER): Payer: Medicare Other | Admitting: Physician Assistant

## 2020-03-10 VITALS — Ht 63.0 in | Wt 119.0 lb

## 2020-03-10 DIAGNOSIS — R7989 Other specified abnormal findings of blood chemistry: Secondary | ICD-10-CM

## 2020-03-10 DIAGNOSIS — D539 Nutritional anemia, unspecified: Secondary | ICD-10-CM | POA: Diagnosis not present

## 2020-03-10 DIAGNOSIS — N1831 Chronic kidney disease, stage 3a: Secondary | ICD-10-CM

## 2020-03-10 DIAGNOSIS — K580 Irritable bowel syndrome with diarrhea: Secondary | ICD-10-CM

## 2020-03-10 MED ORDER — DICYCLOMINE HCL 10 MG PO CAPS: 10.0000 mg | ORAL_CAPSULE | Freq: Three times a day (TID) | ORAL | 2 refills | Status: DC

## 2020-03-10 NOTE — Progress Notes (Signed)
Patient ID: Claire [name], female   DOB: Oct 20, 1951, 69 y.o.   MRN: [id number] .[name] KitchenVirtual Visit via Telephone Note  I connected with Claire [name] on 03/10/20 at  1:20 PM EDT by telephone and verified that I am speaking with the correct person using two identifiers.  Location: Patient: home Provider: clinic   I discussed the limitations, risks, security and privacy concerns of performing an evaluation and management service by telephone and the availability of in person appointments. I also discussed with the patient that there may be a patient responsible charge related to this service. The patient expressed understanding and agreed to proceed.   History of Present Illness: Pt is a 69 yo female who is concerned about recent blood work. She would like to review it. Her mother died of leukemia.   Her IBS-D is worsening and would like to go back on bentyl. She tolerated and helped a lot. She is having frequent loose stools and abdominal cramping.   .. Active Ambulatory Problems    Diagnosis Date Noted  . PTSD (post-traumatic stress disorder) 11/10/2011  . Neurosis, anxiety, panic type 11/10/2011  . Acquired hypothyroidism 11/24/2014  . BP (high blood pressure) 07/24/2012  . Chronic use of benzodiazepine for therapeutic purpose 08/11/2015  . Hyponatremia 12/19/2016  . Hyperkalemia 12/19/2016  . Centrilobular emphysema ([place]) 12/20/2016  . Decreased hemoglobin 12/20/2016  . Stage 3a chronic kidney disease 01/16/2017  . Iron deficiency anemia 01/16/2017  . Anemia of chronic disease 05/11/2017  . Low serum vitamin B12 05/14/2017  . Vitamin D deficiency 05/14/2017  . Depressed mood 05/14/2017  . High serum parathyroid hormone (PTH) 07/19/2017  . Normocytic anemia 07/19/2017  . DOE (dyspnea on exertion) 05/09/2018  . Recurrent UTI 05/07/2019  . Leg cramps 05/29/2019  . No energy 05/29/2019  . History of hypokalemia 10/09/2019  . Vaccine counseling 12/24/2019  . Menopausal vaginal  dryness 12/24/2019  . Chronic rhinitis 01/31/2020  . Chronic respiratory failure with hypoxia ([place]) 01/31/2020  . Weight loss 02/19/2020  . Macrocytic anemia 03/10/2020  . Elevated ferritin 03/10/2020  . Irritable bowel syndrome with diarrhea 03/10/2020   Resolved Ambulatory Problems    Diagnosis Date Noted  . Hypothyroid   . Acute renal failure ([place]) 12/19/2016  . Acute kidney injury ([place]) 03/17/2017   Past Medical History:  Diagnosis Date  . Acid reflux   . Anxiety   . High blood pressure    Reviewed med, allergy, problem list.     Observations/Objective: Very concerned.   .. Today's Vitals   03/10/20 1124  Weight: 119 lb (54 kg)  Height: 5\' 3"  (1.6 m)   Body mass index is 21.08 kg/m.    Assessment and Plan: [name] KitchenMarland KitchenEmmersen was seen today for advice only.  Diagnoses and all orders for this visit:  Macrocytic anemia  Stage 3a chronic kidney disease  Elevated ferritin  Irritable bowel syndrome with diarrhea -     dicyclomine (BENTYL) 10 MG capsule; Take 1 capsule (10 mg total) by mouth 3 (three) times daily before meals.   Labs drawn for screening purposes showed hemoglobin down to 8.9 from baseline of 10.0 and RBC decreased. Size of RBC were big indicating macrocytic anemia. Ferritin was slightly elevated. Iron was normal. Platelets were normal. WBC were normal.  Rechecking CBC/B12/Folate today.  Reassured this was not presenting like leukemia.  Certainly her CKD is likely causing some anemia of chronic disease and her decreased diet due to concern of too much potassium could also be  causing some dietary anemia. Encouraged her to eat more iron rich foods.  Pt denies any hematochezia or melena.  She did stop her ferrous sulfate. Ok for now but I would suggest restarting in the future.  CO2 was elevated. Encouraged patient to use oxygen when needed.   Bentyl sent up to three times a day with meals.   Call with new results.       Follow Up Instructions:     I discussed the assessment and treatment plan with the patient. The patient was provided an opportunity to ask questions and all were answered. The patient agreed with the plan and demonstrated an understanding of the instructions.   The patient was advised to call back or seek an in-person evaluation if the symptoms worsen or if the condition fails to improve as anticipated.  I provided 30 minutes of non-face-to-face time during this encounter.   Tandy Gaw, PA-C

## 2020-03-11 ENCOUNTER — Telehealth: Payer: Self-pay

## 2020-03-11 LAB — CBC WITH DIFFERENTIAL/PLATELET
Basophils Absolute: 0.1 10*3/uL (ref 0.0–0.2)
Basos: 1 %
EOS (ABSOLUTE): 0.2 10*3/uL (ref 0.0–0.4)
Eos: 4 %
Hematocrit: 27 % — ABNORMAL LOW (ref 34.0–46.6)
Hemoglobin: 9.2 g/dL — ABNORMAL LOW (ref 11.1–15.9)
Immature Grans (Abs): 0 10*3/uL (ref 0.0–0.1)
Immature Granulocytes: 0 %
Lymphocytes Absolute: 1.8 10*3/uL (ref 0.7–3.1)
Lymphs: 31 %
MCH: 34.1 pg — ABNORMAL HIGH (ref 26.6–33.0)
MCHC: 34.1 g/dL (ref 31.5–35.7)
MCV: 100 fL — ABNORMAL HIGH (ref 79–97)
Monocytes Absolute: 0.6 10*3/uL (ref 0.1–0.9)
Monocytes: 10 %
Neutrophils Absolute: 3.1 10*3/uL (ref 1.4–7.0)
Neutrophils: 54 %
Platelets: 271 10*3/uL (ref 150–450)
RBC: 2.7 x10E6/uL — CL (ref 3.77–5.28)
RDW: 11.3 % — ABNORMAL LOW (ref 11.7–15.4)
WBC: 5.7 10*3/uL (ref 3.4–10.8)

## 2020-03-11 LAB — B12 AND FOLATE PANEL
Folate: 13 ng/mL (ref >3.0)
Vitamin B-12: 298 pg/mL (ref 232–1245)

## 2020-03-11 NOTE — Progress Notes (Signed)
Claire [name],   Hemoglobin went up some which is good news. RBC went up some.  RBC and Hemoglobin could be due b12 being low.  B12 is pretty low.  You need to come in and get a b12 shot and start taking daily. Recheck CBC and B12 in 1 month. I would start back on oral iron(ferrous sulfate) but just once a day.   [name]

## 2020-03-11 NOTE — Telephone Encounter (Signed)
Pt called to discuss lab results.  Reiterated instructions from [facility].  Pt states that she will call to schedule nurse visit after she checks with her daughter to see when she can bring her.  Tiajuana Amass, CMA

## 2020-03-12 ENCOUNTER — Ambulatory Visit (INDEPENDENT_AMBULATORY_CARE_PROVIDER_SITE_OTHER): Payer: Medicare Other | Admitting: Medical-Surgical

## 2020-03-12 ENCOUNTER — Other Ambulatory Visit: Payer: Self-pay

## 2020-03-12 VITALS — BP 115/79 | HR 105

## 2020-03-12 DIAGNOSIS — D510 Vitamin B12 deficiency anemia due to intrinsic factor deficiency: Secondary | ICD-10-CM | POA: Diagnosis not present

## 2020-03-12 DIAGNOSIS — E538 Deficiency of other specified B group vitamins: Secondary | ICD-10-CM | POA: Diagnosis not present

## 2020-03-12 MED ORDER — CYANOCOBALAMIN 1000 MCG/ML IJ SOLN
1000.0000 ug | Freq: Once | INTRAMUSCULAR | Status: AC
  Administered 2020-03-12: 1000 ug via INTRAMUSCULAR

## 2020-03-12 NOTE — Progress Notes (Signed)
Patient is here for a vitamin B12 injection due to results of recent blood work. B12 injection to left deltoid with no apparent complications. Patient advised to have labs repeated in one month and will decide on treatment from there. She will start daily Vitamin B12 supplements 1000 mcg OTC. Will call with any problems.

## 2020-03-17 ENCOUNTER — Ambulatory Visit (INDEPENDENT_AMBULATORY_CARE_PROVIDER_SITE_OTHER): Payer: Medicare Other | Admitting: Licensed Clinical Social Worker

## 2020-03-17 DIAGNOSIS — F411 Generalized anxiety disorder: Secondary | ICD-10-CM | POA: Diagnosis not present
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
## 2020-03-20 ENCOUNTER — Telehealth (INDEPENDENT_AMBULATORY_CARE_PROVIDER_SITE_OTHER): Payer: Medicare Other | Admitting: Physician Assistant

## 2020-03-20 VITALS — Ht 63.0 in | Wt 119.0 lb

## 2020-03-20 DIAGNOSIS — F418 Other specified anxiety disorders: Secondary | ICD-10-CM

## 2020-03-20 DIAGNOSIS — R4589 Other symptoms and signs involving emotional state: Secondary | ICD-10-CM

## 2020-03-20 DIAGNOSIS — D510 Vitamin B12 deficiency anemia due to intrinsic factor deficiency: Secondary | ICD-10-CM

## 2020-03-20 DIAGNOSIS — D509 Iron deficiency anemia, unspecified: Secondary | ICD-10-CM | POA: Diagnosis not present

## 2020-03-20 DIAGNOSIS — N1831 Chronic kidney disease, stage 3a: Secondary | ICD-10-CM

## 2020-03-20 NOTE — Progress Notes (Signed)
Patient ID: Claire [name], female   DOB: 04-Mar-1951, 68 y.o.   MRN: [id number] .[name] KitchenVirtual Visit via Telephone Note  I connected with Claire [name] on 03/20/2020 at  3:20 PM EDT by telephone and verified that I am speaking with the correct person using two identifiers.  Location: Patient: home Provider: clinic   I discussed the limitations, risks, security and privacy concerns of performing an evaluation and management service by telephone and the availability of in person appointments. I also discussed with the patient that there may be a patient responsible charge related to this service. The patient expressed understanding and agreed to proceed.   History of Present Illness: Patient is a 69 year old female with COPD, hypothyroidism, IBS, depression, anxiety, CKD and more recent microcytic anemia.  She calls in to ask questions about her most recent CBC.  .. Active Ambulatory Problems    Diagnosis Date Noted  . PTSD (post-traumatic stress disorder) 11/10/2011  . Neurosis, anxiety, panic type 11/10/2011  . Acquired hypothyroidism 11/24/2014  . BP (high blood pressure) 07/24/2012  . Chronic use of benzodiazepine for therapeutic purpose 08/11/2015  . Hyponatremia 12/19/2016  . Hyperkalemia 12/19/2016  . Centrilobular emphysema (HCC) 12/20/2016  . Decreased hemoglobin 12/20/2016  . Stage 3a chronic kidney disease 01/16/2017  . Iron deficiency anemia 01/16/2017  . Anemia of chronic disease 05/11/2017  . Low serum vitamin B12 05/14/2017  . Vitamin D deficiency 05/14/2017  . Depressed mood 05/14/2017  . High serum parathyroid hormone (PTH) 07/19/2017  . Normocytic anemia 07/19/2017  . DOE (dyspnea on exertion) 05/09/2018  . Recurrent UTI 05/07/2019  . Leg cramps 05/29/2019  . No energy 05/29/2019  . History of hypokalemia 10/09/2019  . Vaccine counseling 12/24/2019  . Menopausal vaginal dryness 12/24/2019  . Chronic rhinitis 01/31/2020  . Chronic respiratory failure with hypoxia  (HCC) 01/31/2020  . Weight loss 02/19/2020  . Macrocytic anemia 03/10/2020  . Elevated ferritin 03/10/2020  . Irritable bowel syndrome with diarrhea 03/10/2020  . Anxiety about health 03/23/2020  . Pernicious anemia 03/23/2020   Resolved Ambulatory Problems    Diagnosis Date Noted  . Hypothyroid   . Acute renal failure (HCC) 12/19/2016  . Acute kidney injury (HCC) 03/17/2017   Past Medical History:  Diagnosis Date  . Acid reflux   . Anxiety   . High blood pressure    Reviewed med, allergy, problem list.    Observations/Objective: No acute distress. Normal unlabored breathing.  Very nervous and anxious about health.   .. Today's Vitals   03/20/20 1121  Weight: 119 lb (54 kg)  Height: 5\' 3"  (1.6 m)   Body mass index is 21.08 kg/m.    Assessment and Plan: [name] KitchenSrah was seen today for advice only.  Diagnoses and all orders for this visit:  Pernicious anemia -     B12 -     CBC with Differential/Platelet  Stage 3a chronic kidney disease -     CBC with Differential/Platelet -     Basic metabolic panel  Anxiety about health  Iron deficiency anemia, unspecified iron deficiency anemia type   Patient was given reassurance today.  Her peripheral smear did show some macrocytic anemia and low B12 levels.  Gave her a B12 shot and told her to start taking B12 1000 MCG daily.  We will recheck CBC, B12, BMP in 1 month.  Her red blood cells that were originally low increased in the last week as well as her hemoglobin increased.  She does have hx of  IDA  And her iron serum on lower end of normal. I want her to continue taking her ferrous sulfate once a day.Follow up in 6 weeks.   Follow Up Instructions:    I discussed the assessment and treatment plan with the patient. The patient was provided an opportunity to ask questions and all were answered. The patient agreed with the plan and demonstrated an understanding of the instructions.   The patient was advised to call back  or seek an in-person evaluation if the symptoms worsen or if the condition fails to improve as anticipated.  I provided 23 minutes of non-face-to-face time during this encounter.   Iran Planas, PA-C

## 2020-03-23 ENCOUNTER — Encounter: Payer: Self-pay | Admitting: Physician Assistant

## 2020-03-23 DIAGNOSIS — F419 Anxiety disorder, unspecified: Secondary | ICD-10-CM | POA: Insufficient documentation

## 2020-03-23 DIAGNOSIS — F418 Other specified anxiety disorders: Secondary | ICD-10-CM | POA: Insufficient documentation

## 2020-03-23 DIAGNOSIS — D510 Vitamin B12 deficiency anemia due to intrinsic factor deficiency: Secondary | ICD-10-CM | POA: Insufficient documentation

## 2020-03-26 ENCOUNTER — Ambulatory Visit: Payer: Medicare Other | Admitting: Hematology & Oncology

## 2020-03-26 ENCOUNTER — Other Ambulatory Visit: Payer: Medicare Other

## 2020-04-13 ENCOUNTER — Telehealth: Payer: Self-pay | Admitting: Nurse Practitioner

## 2020-04-13 NOTE — Telephone Encounter (Signed)
Mrs. Bolls is picking up her lab work. Please review everything on the order.  Thank you

## 2020-04-13 NOTE — Telephone Encounter (Signed)
These were ordered by [name], looks okay to me.

## 2020-04-14 ENCOUNTER — Other Ambulatory Visit: Payer: Self-pay | Admitting: Physician Assistant

## 2020-04-14 DIAGNOSIS — J432 Centrilobular emphysema: Secondary | ICD-10-CM

## 2020-04-17 NOTE — Telephone Encounter (Signed)
Did she mention what type of clotting disorder? I don't want to stab in the dark and order a bunch of testing. If she knows the specific clotting disorder, we can order that test to see if she has it also. If she is interested, I can also refer her to hematology to have specialized testing run.

## 2020-04-17 NOTE — Telephone Encounter (Signed)
Patient called, states half sister found out she has blood clotting disorder. This is half sister on mother's side. Patient wanting to know if he can have testing done for this?
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
## 2020-04-23 ENCOUNTER — Other Ambulatory Visit: Payer: Self-pay | Admitting: Physician Assistant

## 2020-04-24 ENCOUNTER — Encounter: Payer: Self-pay | Admitting: Physician Assistant

## 2020-04-24 LAB — CBC WITH DIFFERENTIAL/PLATELET
Basophils Absolute: 0.1 10*3/uL (ref 0.0–0.2)
Basos: 1 %
EOS (ABSOLUTE): 0.2 10*3/uL (ref 0.0–0.4)
Eos: 2 %
Hematocrit: 27.8 % — ABNORMAL LOW (ref 34.0–46.6)
Hemoglobin: 9.4 g/dL — ABNORMAL LOW (ref 11.1–15.9)
Immature Grans (Abs): 0 10*3/uL (ref 0.0–0.1)
Immature Granulocytes: 0 %
Lymphocytes Absolute: 1.6 10*3/uL (ref 0.7–3.1)
Lymphs: 25 %
MCH: 33.6 pg — ABNORMAL HIGH (ref 26.6–33.0)
MCHC: 33.8 g/dL (ref 31.5–35.7)
MCV: 99 fL — ABNORMAL HIGH (ref 79–97)
Monocytes Absolute: 0.4 10*3/uL (ref 0.1–0.9)
Monocytes: 7 %
Neutrophils Absolute: 4.3 10*3/uL (ref 1.4–7.0)
Neutrophils: 65 %
Platelets: 265 10*3/uL (ref 150–450)
RBC: 2.8 x10E6/uL — ABNORMAL LOW (ref 3.77–5.28)
RDW: 11.4 % — ABNORMAL LOW (ref 11.7–15.4)
WBC: 6.6 10*3/uL (ref 3.4–10.8)

## 2020-04-24 LAB — B12 AND FOLATE PANEL
Folate: 9.9 ng/mL (ref >3.0)
Vitamin B-12: 1727 pg/mL — ABNORMAL HIGH (ref 232–1245)

## 2020-04-24 NOTE — Telephone Encounter (Signed)
Please see message. °

## 2020-04-27 ENCOUNTER — Ambulatory Visit: Payer: Medicare Other | Admitting: Physician Assistant

## 2020-04-27 ENCOUNTER — Telehealth (HOSPITAL_COMMUNITY): Payer: Self-pay | Admitting: Psychiatry

## 2020-04-27 MED ORDER — CLONAZEPAM 0.5 MG PO TABS: ORAL_TABLET | ORAL | 2 refills | Status: DC

## 2020-04-27 NOTE — Telephone Encounter (Signed)
sent 

## 2020-04-27 NOTE — Progress Notes (Signed)
Shylie,   RBC and hemoglobin are improving. B12 is too elevated. When was last shot? Are you taking any OTC b12 and if so how much?   We should be able to add ferritin to labs. I will get the nurse to check.   [name]

## 2020-04-27 NOTE — Telephone Encounter (Signed)
Pt needs refill on klonopin [facility]

## 2020-05-06 ENCOUNTER — Telehealth (INDEPENDENT_AMBULATORY_CARE_PROVIDER_SITE_OTHER): Payer: Medicare Other | Admitting: Physician Assistant

## 2020-05-06 VITALS — Ht 63.0 in | Wt 105.0 lb

## 2020-05-06 DIAGNOSIS — E441 Mild protein-calorie malnutrition: Secondary | ICD-10-CM | POA: Diagnosis not present

## 2020-05-06 DIAGNOSIS — Z7185 Encounter for immunization safety counseling: Secondary | ICD-10-CM

## 2020-05-06 DIAGNOSIS — Z7189 Other specified counseling: Secondary | ICD-10-CM | POA: Diagnosis not present

## 2020-05-06 DIAGNOSIS — J432 Centrilobular emphysema: Secondary | ICD-10-CM

## 2020-05-06 DIAGNOSIS — R634 Abnormal weight loss: Secondary | ICD-10-CM | POA: Diagnosis not present

## 2020-05-06 DIAGNOSIS — N1831 Chronic kidney disease, stage 3a: Secondary | ICD-10-CM

## 2020-05-06 DIAGNOSIS — F418 Other specified anxiety disorders: Secondary | ICD-10-CM | POA: Diagnosis not present

## 2020-05-06 DIAGNOSIS — J9611 Chronic respiratory failure with hypoxia: Secondary | ICD-10-CM

## 2020-05-06 DIAGNOSIS — Z681 Body mass index (BMI) 19 or less, adult: Secondary | ICD-10-CM

## 2020-05-06 DIAGNOSIS — D638 Anemia in other chronic diseases classified elsewhere: Secondary | ICD-10-CM

## 2020-05-06 MED ORDER — BREZTRI AEROSPHERE 160-9-4.8 MCG/ACT IN AERO: 2.0000 | INHALATION_SPRAY | Freq: Two times a day (BID) | RESPIRATORY_TRACT | 5 refills | Status: DC

## 2020-05-06 NOTE — Progress Notes (Signed)
Discuss lab results.

## 2020-05-06 NOTE — Progress Notes (Signed)
Patient ID: Claire [name], female   DOB: 1951-01-10, 69 y.o.   MRN: [id number] .Claire [name] KitchenVirtual Visit via Telephone Note  I connected with Claire [name] on 05/08/2020 at  1:00 PM EDT by telephone and verified that I am speaking with the correct person using two identifiers.  Location: Patient: home Provider: clinic   I discussed the limitations, risks, security and privacy concerns of performing an evaluation and management service by telephone and the availability of in person appointments. I also discussed with the patient that there may be a patient responsible charge related to this service. The patient expressed understanding and agreed to proceed.   History of Present Illness: Patient is a 69 year old female with COPD, chronic respiratory distress with intermittent use of oxygen therapy, hypertension, CKD, anxiety, chronic anemia who presents to the clinic to discuss health.  Patient is very concerned about her health.  Her last few labs her hemoglobin had dropped some.  Recently it has started to move back in the right direction.  She admits she has lost down from 1 19-1 05.  She knows she is not eating.  Some of this comes from her obsession with trying to keep a kidney healthy diet to prevent any worsening of her chronic kidney disease and some of this comes from just no motivation due to the Covid pandemic.  She has not left her house in a year in 3 months.  She does not feel like her breathing inhalers are working as a show.  She feels that benefit with Spiriva.  She has been on [facility] for years.  She wonders if a change would help her breathing.  She does admit with any exertion she needs oxygen.  She does try to use periods in the house where she does not use oxygen.   Active Ambulatory Problems    Diagnosis Date Noted   PTSD (post-traumatic stress disorder) 11/10/2011   Neurosis, anxiety, panic type 11/10/2011   Acquired hypothyroidism 11/24/2014   BP (high blood pressure) 07/24/2012    Chronic use of benzodiazepine for therapeutic purpose 08/11/2015   Hyponatremia 12/19/2016   Hyperkalemia 12/19/2016   Centrilobular emphysema (HCC) 12/20/2016   Decreased hemoglobin 12/20/2016   Stage 3a chronic kidney disease 01/16/2017   Iron deficiency anemia 01/16/2017   Anemia of chronic disease 05/11/2017   Low serum vitamin B12 05/14/2017   Vitamin D deficiency 05/14/2017   Depressed mood 05/14/2017   High serum parathyroid hormone (PTH) 07/19/2017   Normocytic anemia 07/19/2017   DOE (dyspnea on exertion) 05/09/2018   Recurrent UTI 05/07/2019   Leg cramps 05/29/2019   No energy 05/29/2019   History of hypokalemia 10/09/2019   Vaccine counseling 12/24/2019   Menopausal vaginal dryness 12/24/2019   Chronic rhinitis 01/31/2020   Chronic respiratory failure with hypoxia (HCC) 01/31/2020   Weight loss 02/19/2020   Macrocytic anemia 03/10/2020   Elevated ferritin 03/10/2020   Irritable bowel syndrome with diarrhea 03/10/2020   Anxiety about health 03/23/2020   Pernicious anemia 03/23/2020   Malnutrition of mild degree (HCC) 05/06/2020   BMI less than 19,adult 05/08/2020   Resolved Ambulatory Problems    Diagnosis Date Noted   Hypothyroid    Acute renal failure (HCC) 12/19/2016   Acute kidney injury (HCC) 03/17/2017   Past Medical History:  Diagnosis Date   Acid reflux    Anxiety    High blood pressure    Reviewed med, allergy, problem list.     Observations/Objective: No acute distress. Anxious about health.  Normal breathing.   .. Today's Vitals   05/06/20 1053  Weight: 105 lb (47.6 kg)  Height: 5\' 3"  (1.6 m)   Body mass index is 18.6 kg/m.    Assessment and Plan: Claire [name] KitchenMarland KitchenMarcile was seen today for results.  Diagnoses and all orders for this visit:  Anxiety about health  Malnutrition of mild degree ([place])  Vaccine counseling  Weight loss  BMI less than 19,adult  Chronic respiratory failure with hypoxia  (HCC) -     Budeson-Glycopyrrol-Formoterol (BREZTRI AEROSPHERE) 160-9-4.8 MCG/ACT AERO; Inhale 2 puffs into the lungs in the morning and at bedtime.  Centrilobular emphysema (HCC) -     Budeson-Glycopyrrol-Formoterol (BREZTRI AEROSPHERE) 160-9-4.8 MCG/ACT AERO; Inhale 2 puffs into the lungs in the morning and at bedtime.  Stage 3a chronic kidney disease  Anemia of chronic disease   Long discussion about weight loss. Pt is not eating. She also has not left her house in over a year. I suspect she is muscle wasting as well. She is anxious about what to eat because of there CKD. She agrees to get boost and drink 3 times a day WITH a MEAL. Discussed importance of nutrition. Recheck weight in 3 weeks.   COPD seems to be more breathless. I would like to stop sprivia and dulera and try breztri triple inhaler. Discussed how to use. Follow up in 3 weeks.   Discussed anemia and how long hx of this and she is almost up to baseline. Increasing her nutrition could actually help with this.  Since her peripheral smear did show some macrocytic anemia and her B12 was low we have since replaced her B12 to hopefully but will help to correct some of her anemia.  Recheck in 3 weeks.   Encouraged covid vaccine. She wants to "feel better" before she gets it.      Follow Up Instructions:    I discussed the assessment and treatment plan with the patient. The patient was provided an opportunity to ask questions and all were answered. The patient agreed with the plan and demonstrated an understanding of the instructions.   The patient was advised to call back or seek an in-person evaluation if the symptoms worsen or if the condition fails to improve as anticipated.  I provided 30 minutes of non-face-to-face time during this encounter.   Iran Planas, PA-C

## 2020-05-08 ENCOUNTER — Other Ambulatory Visit: Payer: Self-pay

## 2020-05-08 ENCOUNTER — Encounter: Payer: Self-pay | Admitting: Physician Assistant

## 2020-05-08 DIAGNOSIS — Z681 Body mass index (BMI) 19 or less, adult: Secondary | ICD-10-CM | POA: Insufficient documentation

## 2020-05-08 MED ORDER — PROAIR HFA 108 (90 BASE) MCG/ACT IN AERS: 1.0000 | INHALATION_SPRAY | RESPIRATORY_TRACT | 3 refills | Status: DC | PRN

## 2020-05-11 ENCOUNTER — Other Ambulatory Visit: Payer: Self-pay | Admitting: Physician Assistant

## 2020-05-12 ENCOUNTER — Encounter: Payer: Self-pay | Admitting: Nurse Practitioner

## 2020-05-12 ENCOUNTER — Other Ambulatory Visit: Payer: Self-pay

## 2020-05-12 ENCOUNTER — Ambulatory Visit (INDEPENDENT_AMBULATORY_CARE_PROVIDER_SITE_OTHER): Payer: Medicare Other | Admitting: Nurse Practitioner

## 2020-05-12 VITALS — BP 113/73 | HR 97 | Temp 98.2°F | Ht 63.0 in | Wt 105.0 lb

## 2020-05-12 DIAGNOSIS — N3090 Cystitis, unspecified without hematuria: Secondary | ICD-10-CM | POA: Diagnosis not present

## 2020-05-12 DIAGNOSIS — N3289 Other specified disorders of bladder: Secondary | ICD-10-CM | POA: Diagnosis not present

## 2020-05-12 LAB — POCT URINALYSIS DIP (CLINITEK)
Bilirubin, UA: NEGATIVE
Blood, UA: NEGATIVE
Glucose, UA: NEGATIVE mg/dL
Ketones, POC UA: NEGATIVE mg/dL
Nitrite, UA: NEGATIVE
POC PROTEIN,UA: NEGATIVE
Spec Grav, UA: 1.015 (ref 1.010–1.025)
Urobilinogen, UA: 0.2 E.U./dL
pH, UA: 6.5 (ref 5.0–8.0)

## 2020-05-12 MED ORDER — AMOXICILLIN-POT CLAVULANATE 875-125 MG PO TABS: 1.0000 | ORAL_TABLET | Freq: Two times a day (BID) | ORAL | 0 refills | Status: DC

## 2020-05-12 NOTE — Progress Notes (Signed)
Acute Office Visit  Subjective:    Patient ID: Claire [name], female    DOB: 07-04-1951, 69 y.o.   MRN: [id number]  Chief Complaint  Patient presents with  . Irritable Bowel Syndrome    onset:3d, hx IBS, has a Rx for Bentyl before but is not taking now, drank a Boost High Calorie drink, had adbominal cramping like she was going to have diarrhea, has not acutally had diarrhea    HPI Patient is in today for spasms in her abdomen. She is unsure if these are coming from her bladder or her colon. She does have a history of IBS. She is prescribed Bentyl, but has not been taking the medication. She also has a history of recurrent UTI's for which she was prescribed Premarin cream, which she has not been using, either. She is cautious with medications due to her kidney issues and is hesitant to use anything that may cause more harm.  Over the weekend she started drinking high calorie boost, but shortly after she reports she "felt weird all over" and "felt like I was going to die". She also reports her head felt "funny". She was unsure if this was a reaction of the boost irritating her IBS or a possible UTI.    Past Medical History:  Diagnosis Date  . Acid reflux   . Anxiety   . High blood pressure   . High serum parathyroid hormone (PTH) 07/19/2017   96 06/28/2014, 162 12/29/16  . Hypothyroid   . PTSD (post-traumatic stress disorder)     History reviewed. No pertinent surgical history.  Family History  Problem Relation Age of Onset  . Anxiety disorder Mother   . OCD Daughter   . Alcohol abuse Father   . Anxiety disorder Sister     Social History   Socioeconomic History  . Marital status: Divorced    Spouse name: Not on file  . Number of children: 2  . Years of education: 59  . Highest education level: Associate degree: academic program  Occupational History  . 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 Smoking status: Current Some Day Smoker     Packs/day: 0.25    Years: 50.00    Pack years: 12.50    Types: Cigarettes  . Smokeless tobacco: Never Used  Substance and Sexual Activity  . Alcohol use: No  . Drug use: No  . Sexual activity: Not Currently  Other Topics Concern  . Not on file  Social History Narrative  . Not on file   Social Determinants of Health   Financial Resource Strain: Low Risk   . Difficulty of Paying Living Expenses: Not hard at all  Food Insecurity: No Food Insecurity  . Worried About Programme researcher, broadcasting/film/video in the Last Year: Never true  . Ran Out of Food in the Last Year: Never true  Transportation Needs: No Transportation Needs  . Lack of Transportation (Medical): No  . Lack of Transportation (Non-Medical): No  Physical Activity: Inactive  . Days of Exercise per Week: 0 days  . Minutes of Exercise per Session: 0 min  Stress: No Stress Concern Present  . Feeling of Stress : Only a little  Social Connections: Moderately Isolated  . Frequency of Communication with Friends and Family: More than three times a week  . Frequency of Social Gatherings with Friends and Family: Never  . Attends Religious Services: Never  . Active Member of Clubs or Organizations: Not  on file  . Attends Archivist Meetings: Never  . Marital Status: Divorced  Human resources officer Violence: Not At Risk  . Fear of Current or Ex-Partner: No  . Emotionally Abused: No  . Physically Abused: No  . Sexually Abused: No    Outpatient Medications Prior to Visit  Medication Sig Dispense Refill  . albuterol (PROVENTIL) (2.5 MG/3ML) 0.083% nebulizer solution 1 VIAL IN NEBULIZER EVERY 4 HOURS AS NEEDED WHEEZING OR FOR SHORTNESS OF BREATH 75 mL 6  . albuterol (VENTOLIN HFA) 108 (90 Base) MCG/ACT inhaler INHALE 1-2 PUFFS INTO THE LUNGS EVERY 4 (FOUR) HOURS AS NEEDED FOR WHEEZING OR SHORTNESS OF BREATH. 54 g 0  . atenolol (TENORMIN) 25 MG tablet TAKE 1 TABLET BY MOUTH EVERY DAY 90 tablet 1  . cholecalciferol (VITAMIN D) 25 MCG (1000  UT) tablet Take 1,000 Units by mouth daily.    . clonazePAM (KLONOPIN) 0.5 MG tablet TAKE 1 TABLET BY MOUTH 3 TIMES A DAY 90 tablet 2  . estradiol (ESTRACE VAGINAL) 0.1 MG/GM vaginal cream Place 1 Applicatorful vaginally 3 (three) times a week. And one pea-size amt applied to urethral opening. 42.5 g 1  . famotidine (PEPCID) 20 MG tablet TAKE 1 TABLET BY MOUTH TWICE A DAY 180 tablet 1  . ferrous sulfate 325 (65 FE) MG EC tablet 1 tab PO TID every other day 90 tablet 11  . levothyroxine (SYNTHROID) 75 MCG tablet TAKE 1 TABLET BY MOUTH EVERY DAY 90 tablet 3  . AMBULATORY NON FORMULARY MEDICATION Continuous stationary and portable oxygen tanks for use with ambulation. DX: COPD with hypoxia. 4 Device 11  . AMBULATORY NON FORMULARY MEDICATION Home concentrator and one Imogen G3 portable oxygen concentrator for hypoxia. 1 Device 0  . AMBULATORY NON FORMULARY MEDICATION Portable oxygen compressor device for portable O2 administration continuous 2L/min while walking. Dx COPD w/ hypoxia. Titrate for OCD (oxygen conserving device). 1 Units 1  . AMBULATORY NON FORMULARY MEDICATION One pulse ox to manage SOB and O2 use. 360 g 5  . AMBULATORY NON FORMULARY MEDICATION Portable oxygen concentrator at 2L of continuous oxygen to use with ambulation for COPD and chronic hypoxemia. 1 Device 0  . Budeson-Glycopyrrol-Formoterol (BREZTRI AEROSPHERE) 160-9-4.8 MCG/ACT AERO Inhale 2 puffs into the lungs in the morning and at bedtime. (Patient not taking: Reported on 05/12/2020) 10.7 g 5  . cetirizine (ZYRTEC) 10 MG chewable tablet Chew 10 mg by mouth daily.    [name] dextromethorphan-guaiFENesin (MUCINEX DM) 30-600 MG 12hr tablet Take 1 tablet by mouth 2 (two) times daily.    [name] dicyclomine (BENTYL) 10 MG capsule Take 1 capsule (10 mg total) by mouth 3 (three) times daily before meals. (Patient not taking: Reported on 05/12/2020) 90 capsule 2  . fexofenadine (ALLEGRA ALLERGY) 60 MG tablet Take 1 tablet (60 mg total) by mouth 2 (two)  times daily. (Patient not taking: Reported on 05/12/2020) 60 tablet 1  . Magnesium 250 MG TABS Take 250 mg by mouth. Take one tablet daily    . mometasone (NASONEX) 50 MCG/ACT nasal spray One spray in each nostril twice a day, use left hand for right nostril, and right hand for left nostril.  Please dispense one bottle. (Patient not taking: Reported on 05/12/2020) 1 g 2   No facility-administered medications prior to visit.    Allergies  Allergen Reactions  . Bactrim [Sulfamethoxazole-Trimethoprim]     ACUTE RENAL fAILURE/HYPONATREMIA/HYPERKALEMIA  . Macrodantin [Nitrofurantoin Macrocrystal] Rash  . Trimethoprim     Fatigue/weakness/no appetite  .  Zithromax [Azithromycin]     diarrhea    Review of Systems  Constitutional: Positive for appetite change and fatigue. Negative for activity change, chills and fever.  Gastrointestinal: Positive for abdominal pain. Negative for abdominal distention, constipation, diarrhea, nausea and vomiting.  Genitourinary: Positive for dysuria, frequency and urgency. Negative for hematuria, vaginal discharge and vaginal pain.  Neurological: Negative for headaches.       Objective:    Physical Exam Vitals and nursing note reviewed.  Constitutional:      Appearance: Normal appearance.  Eyes:     Extraocular Movements: Extraocular movements intact.     Conjunctiva/sclera: Conjunctivae normal.     Pupils: Pupils are equal, round, and reactive to light.  Cardiovascular:     Rate and Rhythm: Normal rate and regular rhythm.     Pulses: Normal pulses.     Heart sounds: Normal heart sounds.  Pulmonary:     Effort: Pulmonary effort is normal. No respiratory distress.     Breath sounds: Normal breath sounds. No wheezing.  Abdominal:     General: Abdomen is flat. Bowel sounds are normal. There is no distension.     Palpations: Abdomen is soft.     Tenderness: There is abdominal tenderness. There is no right CVA tenderness or left CVA tenderness.   Musculoskeletal:        General: Normal range of motion.     Cervical back: Normal range of motion.     Right lower leg: No edema.     Left lower leg: No edema.  Skin:    General: Skin is warm and dry.     Capillary Refill: Capillary refill takes less than 2 seconds.  Neurological:     General: No focal deficit present.     Mental Status: She is alert and oriented to person, place, and time.  Psychiatric:        Mood and Affect: Mood normal.        Behavior: Behavior normal.        Thought Content: Thought content normal.        Judgment: Judgment normal.     BP 113/73   Pulse 97   Temp 98.2 F (36.8 C) (Oral)   Ht 5\' 3"  (1.6 m)   Wt 105 lb (47.6 kg)   SpO2 100%   BMI 18.60 kg/m  Wt Readings from Last 3 Encounters:  05/12/20 105 lb (47.6 kg)  05/06/20 105 lb (47.6 kg)  03/20/20 119 lb (54 kg)    There are no preventive care reminders to display for this patient.  There are no preventive care reminders to display for this patient.   Lab Results  Component Value Date   TSH 1.530 03/03/2020   Lab Results  Component Value Date   WBC 6.6 04/23/2020   HGB 9.4 (L) 04/23/2020   HCT 27.8 (L) 04/23/2020   MCV 99 (H) 04/23/2020   PLT 265 04/23/2020   Lab Results  Component Value Date   NA 140 03/03/2020   K 4.2 03/03/2020   CO2 30 (H) 03/03/2020   GLUCOSE 85 03/03/2020   BUN 14 03/03/2020   CREATININE 1.13 (H) 03/03/2020   BILITOT 0.2 03/03/2020   ALKPHOS 57 03/03/2020   AST 13 03/03/2020   ALT 7 03/03/2020   PROT 6.8 03/03/2020   ALBUMIN 4.3 03/03/2020   CALCIUM 9.5 03/03/2020   Lab Results  Component Value Date   CHOL 174 03/03/2020   Lab Results  Component Value Date  HDL 55 03/03/2020   Lab Results  Component Value Date   LDLCALC 105 (H) 03/03/2020   Lab Results  Component Value Date   TRIG 75 03/03/2020   Lab Results  Component Value Date   CHOLHDL 3.2 03/03/2020   No results found for: HGBA1C     Assessment & Plan:   1.  Cystitis Symptoms and presentation consistent with acute cystitis. She does have a history of recurrent UTI for which augmentin has been effective. UA today reveals small leukocytes present. She does not have systemic symptoms present today, but this could be the cause for her feeling badly.   PLAN: -Augmentin for 7 days.  -Increase fluid intake -Encouraged patient to use Premarin cream prescribed by PCP -Will obtain culture -Follow-up if symptoms worsen or persist  - amoxicillin-clavulanate (AUGMENTIN) 875-125 MG tablet; Take 1 tablet by mouth 2 (two) times daily.  Dispense: 14 tablet; Refill: 0  2. Bladder spasm Bladder spasms versus intestinal spasms due to IBS. Given the current presence of leukocytes in the urine, we will treat for cystitis. She would benefit from using the Bentyl for her IBS and intestinal spasms.  PLAN: -Augmentin for 7 days -Increase fluid intakes -Encouraged the patient to use Premarin cream and Bentyl prescribed by PCP -Follow-up if symptoms worsen or persist  - POCT URINALYSIS DIP (CLINITEK) - Urine Culture  Return if symptoms worsen or fail to improve.   Tollie Eth, NP

## 2020-05-12 NOTE — Patient Instructions (Signed)
I highly recommend you take the Bentyl for your IBS to help with the spasms in your intestines due to IBS, this will help with many of your symptoms.  I also recommend you use the Premarin cream, pea size, over the urethra to help prevent future urinary tract infections.    Urinary Tract Infection, Adult A urinary tract infection (UTI) is an infection of any part of the urinary tract. The urinary tract includes:  The kidneys.  The ureters.  The bladder.  The urethra. These organs make, store, and get rid of pee (urine) in the body. What are the causes? This is caused by germs (bacteria) in your genital area. These germs grow and cause swelling (inflammation) of your urinary tract. What increases the risk? You are more likely to develop this condition if:  You have a small, thin tube (catheter) to drain pee.  You cannot control when you pee or poop (incontinence).  You are female, and: ? You use these methods to prevent pregnancy:  A medicine that kills sperm (spermicide).  A device that blocks sperm (diaphragm). ? You have low levels of a female hormone (estrogen). ? You are pregnant.  You have genes that add to your risk.  You are sexually active.  You take antibiotic medicines.  You have trouble peeing because of: ? A prostate that is bigger than normal, if you are female. ? A blockage in the part of your body that drains pee from the bladder (urethra). ? A kidney stone. ? A nerve condition that affects your bladder (neurogenic bladder). ? Not getting enough to drink. ? Not peeing often enough.  You have other conditions, such as: ? Diabetes. ? A weak disease-fighting system (immune system). ? Sickle cell disease. ? Gout. ? Injury of the spine. What are the signs or symptoms? Symptoms of this condition include:  Needing to pee right away (urgently).  Peeing often.  Peeing small amounts often.  Pain or burning when peeing.  Blood in the pee.  Pee that  smells bad or not like normal.  Trouble peeing.  Pee that is cloudy.  Fluid coming from the vagina, if you are female.  Pain in the belly or lower back. Other symptoms include:  Throwing up (vomiting).  No urge to eat.  Feeling mixed up (confused).  Being tired and grouchy (irritable).  A fever.  Watery poop (diarrhea). How is this treated? This condition may be treated with:  Antibiotic medicine.  Other medicines.  Drinking enough water. Follow these instructions at home:  Medicines  Take over-the-counter and prescription medicines only as told by your doctor.  If you were prescribed an antibiotic medicine, take it as told by your doctor. Do not stop taking it even if you start to feel better. General instructions  Make sure you: ? Pee until your bladder is empty. ? Do not hold pee for a long time. ? Empty your bladder after sex. ? Wipe from front to back after pooping if you are a female. Use each tissue one time when you wipe.  Drink enough fluid to keep your pee pale yellow.  Keep all follow-up visits as told by your doctor. This is important. Contact a doctor if:  You do not get better after 1-2 days.  Your symptoms go away and then come back. Get help right away if:  You have very bad back pain.  You have very bad pain in your lower belly.  You have a fever.  You are  sick to your stomach (nauseous).  You are throwing up. Summary  A urinary tract infection (UTI) is an infection of any part of the urinary tract.  This condition is caused by germs in your genital area.  There are many risk factors for a UTI. These include having a small, thin tube to drain pee and not being able to control when you pee or poop.  Treatment includes antibiotic medicines for germs.  Drink enough fluid to keep your pee pale yellow. This information is not intended to replace advice given to you by your health care provider. Make sure you discuss any questions  you have with your health care provider. Document Revised: 11/08/2018 Document Reviewed: 05/31/2018 Elsevier Patient Education  2020 [employer].

## 2020-05-13 LAB — URINE CULTURE
MICRO NUMBER:: 10567948
Result:: NO GROWTH
SPECIMEN QUALITY:: ADEQUATE

## 2020-05-13 LAB — SPECIMEN STATUS REPORT

## 2020-05-13 LAB — FERRITIN: Ferritin: 284 ng/mL — ABNORMAL HIGH (ref 15–150)

## 2020-05-18 ENCOUNTER — Telehealth: Payer: Self-pay

## 2020-05-18 DIAGNOSIS — E538 Deficiency of other specified B group vitamins: Secondary | ICD-10-CM

## 2020-05-18 DIAGNOSIS — I10 Essential (primary) hypertension: Secondary | ICD-10-CM

## 2020-05-18 DIAGNOSIS — N1831 Chronic kidney disease, stage 3a: Secondary | ICD-10-CM

## 2020-05-18 NOTE — Telephone Encounter (Signed)
Signed. Thank you.

## 2020-05-18 NOTE — Telephone Encounter (Signed)
Patient called, she thinks its time do a repeat of her blood work since it has almost been a month.   I have pended Ferritin, B12, CMP, and CBC. Please let me know if you want anything additional

## 2020-05-19 ENCOUNTER — Other Ambulatory Visit: Payer: Self-pay | Admitting: Neurology

## 2020-05-19 MED ORDER — FLUTICASONE PROPIONATE 50 MCG/ACT NA SUSP
2.0000 | Freq: Every day | NASAL | 6 refills | Status: DC
Start: 2020-05-19 — End: 2020-07-13

## 2020-05-19 NOTE — Telephone Encounter (Signed)
Patient advised.

## 2020-05-25 ENCOUNTER — Telehealth (INDEPENDENT_AMBULATORY_CARE_PROVIDER_SITE_OTHER): Payer: Medicare Other | Admitting: Psychiatry

## 2020-05-25 ENCOUNTER — Encounter (HOSPITAL_COMMUNITY): Payer: Self-pay | Admitting: Psychiatry

## 2020-05-25 DIAGNOSIS — F411 Generalized anxiety disorder: Secondary | ICD-10-CM

## 2020-05-25 DIAGNOSIS — F4310 Post-traumatic stress disorder, unspecified: Secondary | ICD-10-CM

## 2020-05-25 DIAGNOSIS — R634 Abnormal weight loss: Secondary | ICD-10-CM | POA: Diagnosis not present

## 2020-05-25 DIAGNOSIS — F410 Panic disorder [episodic paroxysmal anxiety] without agoraphobia: Secondary | ICD-10-CM | POA: Diagnosis not present

## 2020-05-25 MED ORDER — MIRTAZAPINE 7.5 MG PO TABS
7.5000 mg | ORAL_TABLET | Freq: Every day | ORAL | 0 refills | Status: DC
Start: 2020-05-25 — End: 2020-06-17

## 2020-05-25 NOTE — Progress Notes (Signed)
Patient ID: Claire [name], female   DOB: 06/26/1951, 69 y.o.   MRN: [id number]  [facility] Outpatient Follow up visit [address] Koopmann [phone] 69 y.o.  05/25/2020 4:45 PM  Chief Complaint:  Anxiety follow up, med review  History of Present Illness:    I connected with Carl Best on 05/25/20 at  4:30 PM EDT by telephone and verified that I am speaking with the correct person using two identifiers.   I discussed the limitations, risks, security and privacy concerns of performing an evaluation and management service by telephone and the availability of in person appointments. I also discussed with the patient that there may be a patient responsible charge related to this service. The patient expressed understanding and agreed to proceed.  Patient location : home Provider location : home   Living seperately but not happy with landlord, planning to move to elderly living apartment  Says daughter calls me crazy,  Patient had anemia and weight loss, recovering some,  Gets anxious if outside or changes Says daughter wants to take her stuff or ask/demand when she needs  Keeps to herself . klonopine helps anxiety, understands the risk dosnt want to change  Modifying factor: breahting techniques. grandkids   PTSD is related to her past some flashbacks. Distraction helps. Denies worsening  Depression: somewhat subdue No side effects     Aggravating factors: past childhood, conflicts with daughter No psychosis  Medical History; Past Medical History:  Diagnosis Date  . Acid reflux   . Anxiety   . High blood pressure   . High serum parathyroid hormone (PTH) 07/19/2017   96 06/28/2014, 162 12/29/16  . Hypothyroid   . PTSD (post-traumatic stress disorder)     Allergies: Allergies  Allergen Reactions  . Bactrim [Sulfamethoxazole-Trimethoprim]     ACUTE RENAL fAILURE/HYPONATREMIA/HYPERKALEMIA  . Macrodantin [Nitrofurantoin Macrocrystal] Rash  .  Trimethoprim     Fatigue/weakness/no appetite  . Zithromax [Azithromycin]     diarrhea    Medications: Outpatient Encounter Medications as of 05/25/2020  Medication Sig  . albuterol (PROVENTIL) (2.5 MG/3ML) 0.083% nebulizer solution 1 VIAL IN NEBULIZER EVERY 4 HOURS AS NEEDED WHEEZING OR FOR SHORTNESS OF BREATH  . albuterol (VENTOLIN HFA) 108 (90 Base) MCG/ACT inhaler INHALE 1-2 PUFFS INTO THE LUNGS EVERY 4 (FOUR) HOURS AS NEEDED FOR WHEEZING OR SHORTNESS OF BREATH.  [name] AMBULATORY NON FORMULARY MEDICATION Continuous stationary and portable oxygen tanks for use with ambulation. DX: COPD with hypoxia.  . AMBULATORY NON FORMULARY MEDICATION Home concentrator and one Imogen G3 portable oxygen concentrator for hypoxia.  . AMBULATORY NON FORMULARY MEDICATION Portable oxygen compressor device for portable O2 administration continuous 2L/min while walking. Dx COPD w/ hypoxia. Titrate for OCD (oxygen conserving device).  . AMBULATORY NON FORMULARY MEDICATION One pulse ox to manage SOB and O2 use.  . AMBULATORY NON FORMULARY MEDICATION Portable oxygen concentrator at 2L of continuous oxygen to use with ambulation for COPD and chronic hypoxemia.  [name] amoxicillin-clavulanate (AUGMENTIN) 875-125 MG tablet Take 1 tablet by mouth 2 (two) times daily.  [name] atenolol (TENORMIN) 25 MG tablet TAKE 1 TABLET BY MOUTH EVERY DAY  . Budeson-Glycopyrrol-Formoterol (BREZTRI AEROSPHERE) 160-9-4.8 MCG/ACT AERO Inhale 2 puffs into the lungs in the morning and at bedtime. (Patient not taking: Reported on 05/12/2020)  . cetirizine (ZYRTEC) 10 MG chewable tablet Chew 10 mg by mouth daily.  . cholecalciferol (VITAMIN D) 25 MCG (1000 UT) tablet Take 1,000 Units by mouth daily.  . clonazePAM (KLONOPIN) 0.5  MG tablet TAKE 1 TABLET BY MOUTH 3 TIMES A DAY  . dextromethorphan-guaiFENesin (MUCINEX DM) 30-600 MG 12hr tablet Take 1 tablet by mouth 2 (two) times daily.  [name] dicyclomine (BENTYL) 10 MG capsule Take 1 capsule (10 mg total) by mouth  3 (three) times daily before meals. (Patient not taking: Reported on 05/12/2020)  . estradiol (ESTRACE VAGINAL) 0.1 MG/GM vaginal cream Place 1 Applicatorful vaginally 3 (three) times a week. And one pea-size amt applied to urethral opening.  . famotidine (PEPCID) 20 MG tablet TAKE 1 TABLET BY MOUTH TWICE A DAY  . ferrous sulfate 325 (65 FE) MG EC tablet 1 tab PO TID every other day  . fexofenadine (ALLEGRA ALLERGY) 60 MG tablet Take 1 tablet (60 mg total) by mouth 2 (two) times daily. (Patient not taking: Reported on 05/12/2020)  . fluticasone (FLONASE) 50 MCG/ACT nasal spray Place 2 sprays into both nostrils daily.  [name] levothyroxine (SYNTHROID) 75 MCG tablet TAKE 1 TABLET BY MOUTH EVERY DAY  . Magnesium 250 MG TABS Take 250 mg by mouth. Take one tablet daily  . mirtazapine (REMERON) 7.5 MG tablet Take 1 tablet (7.5 mg total) by mouth at bedtime.  . mometasone (NASONEX) 50 MCG/ACT nasal spray One spray in each nostril twice a day, use left hand for right nostril, and right hand for left nostril.  Please dispense one bottle. (Patient not taking: Reported on 05/12/2020)   No facility-administered encounter medications on file as of 05/25/2020.    Family History; Family History  Problem Relation Age of Onset  . Anxiety disorder Mother   . OCD Daughter   . Alcohol abuse Father   . Anxiety disorder Sister        Labs:  Recent Results (from the past 2160 hour(s))  CBC     Status: Abnormal   Collection Time: 03/03/20  2:02 PM  Result Value Ref Range   WBC 5.9 3.4 - 10.8 x10E3/uL   RBC 2.65 (LL) 3.77 - 5.28 x10E6/uL   Hemoglobin 8.9 (L) 11.1 - 15.9 g/dL   Hematocrit 32.0 (L) 23.3 - 46.6 %   MCV 99 (H) 79 - 97 fL   MCH 33.6 (H) 26.6 - 33.0 pg   MCHC 33.8 31 - 35 g/dL   RDW 43.5 (L) 68.6 - 16.8 %   Platelets 232 150 - 450 x10E3/uL  Fe+TIBC+Fer     Status: Abnormal   Collection Time: 03/03/20  2:02 PM  Result Value Ref Range   Total Iron Binding Capacity 226 (L) 250 - 450 ug/dL   UIBC 372  902 - 111 ug/dL   Iron 72 27 - 552 ug/dL   Iron Saturation 32 15 - 55 %   Ferritin 239 (H) 15.0 - 150.0 ng/mL  Lipid Profile     Status: Abnormal   Collection Time: 03/03/20  2:02 PM  Result Value Ref Range   Cholesterol, Total 174 100 - 199 mg/dL   Triglycerides 75 0 - 149 mg/dL   HDL 55 >08 mg/dL   VLDL Cholesterol Cal 14 5 - 40 mg/dL   LDL Chol Calc (NIH) 022 (H) 0 - 99 mg/dL   Chol/HDL Ratio 3.2 0.0 - 4.4 ratio    Comment:                                   T. Chol/HDL Ratio  Men  Women                               1/2 Avg.Risk  3.4    3.3                                   Avg.Risk  5.0    4.4                                2X Avg.Risk  9.6    7.1                                3X Avg.Risk 23.4   11.0   Comprehensive Metabolic Panel (CMET)     Status: Abnormal   Collection Time: 03/03/20  2:02 PM  Result Value Ref Range   Glucose 85 65 - 99 mg/dL   BUN 14 8 - 27 mg/dL   Creatinine, Ser 1.611.13 (H) 0.57 - 1.00 mg/dL   GFR calc non Af Amer 50 (L) >59 mL/min/1.73   GFR calc Af Amer 57 (L) >59 mL/min/1.73   BUN/Creatinine Ratio 12 12 - 28   Sodium 140 134 - 144 mmol/L   Potassium 4.2 3.5 - 5.2 mmol/L   Chloride 98 96 - 106 mmol/L   CO2 30 (H) 20 - 29 mmol/L   Calcium 9.5 8.7 - 10.3 mg/dL   Total Protein 6.8 6.0 - 8.5 g/dL   Albumin 4.3 3.8 - 4.8 g/dL   Globulin, Total 2.5 1.5 - 4.5 g/dL   Albumin/Globulin Ratio 1.7 1.2 - 2.2   Bilirubin Total 0.2 0.0 - 1.2 mg/dL   Alkaline Phosphatase 57 39 - 117 IU/L   AST 13 0 - 40 IU/L   ALT 7 0 - 32 IU/L  TSH     Status: None   Collection Time: 03/03/20  2:02 PM  Result Value Ref Range   TSH 1.530 0.450 - 4.500 uIU/mL  Pathologist smear review     Status: Abnormal   Collection Time: 03/03/20  2:02 PM  Result Value Ref Range   Path Rev WBC Appear normal.    Path Rev RBC Comment     Comment: Macrocytic   Path Rev PLTs Appear normal.    PATH INTERP BLD-IMP Comment     Comment: Macrocytic  anemia Comment: Common causes of macrocytosis/macrocytic anemia includes drug effect, liver disease, and B12/folate deficiency    PATHOLOGIST NAME Comment     Comment: Reviewed by:  Raynald BlendMahmood Aijazi, MD, Pathologist   WBC 5.9 3.4 - 10.8 x10E3/uL   RBC 2.65 (LL) 3.77 - 5.28 x10E6/uL   Hemoglobin 8.9 (L) 11.1 - 15.9 g/dL   Hematocrit 09.626.3 (L) 04.534.0 - 46.6 %   MCV 99 (H) 79 - 97 fL   MCH 33.6 (H) 26.6 - 33.0 pg   MCHC 33.8 31 - 35 g/dL   RDW 40.911.0 (L) 81.111.7 - 91.415.4 %   Platelets 232 150 - 450 x10E3/uL   Neutrophils 64 Not Estab. %   Lymphs 24 Not Estab. %   Monocytes 9 Not Estab. %   Eos 2 Not Estab. %   Basos 1 Not Estab. %   Neutrophils Absolute 3.8 1 - 7 x10E3/uL   Lymphocytes Absolute 1.4  0 - 3 x10E3/uL   Monocytes Absolute 0.5 0 - 0 x10E3/uL   EOS (ABSOLUTE) 0.1 0.0 - 0.4 x10E3/uL   Basophils Absolute 0.0 0 - 0 x10E3/uL   Immature Granulocytes 0 Not Estab. %   Immature Grans (Abs) 0.0 0.0 - 0.1 x10E3/uL  Specimen status report     Status: None   Collection Time: 03/03/20  2:02 PM  Result Value Ref Range   specimen status report Comment     Comment: Written Authorization Written Authorization Written Authorization Received. Authorization received from [facility] 03-05-2020 Logged by Kurtis Bushman   CBC with Differential/Platelet     Status: Abnormal   Collection Time: 03/10/20  2:34 PM  Result Value Ref Range   WBC 5.7 3.4 - 10.8 x10E3/uL   RBC 2.70 (LL) 3.77 - 5.28 x10E6/uL   Hemoglobin 9.2 (L) 11.1 - 15.9 g/dL   Hematocrit 27.0 (L) 34.0 - 46.6 %   MCV 100 (H) 79 - 97 fL   MCH 34.1 (H) 26.6 - 33.0 pg   MCHC 34.1 31 - 35 g/dL   RDW 11.3 (L) 11.7 - 15.4 %   Platelets 271 150 - 450 x10E3/uL   Neutrophils 54 Not Estab. %   Lymphs 31 Not Estab. %   Monocytes 10 Not Estab. %   Eos 4 Not Estab. %   Basos 1 Not Estab. %   Neutrophils Absolute 3.1 1 - 7 x10E3/uL   Lymphocytes Absolute 1.8 0 - 3 x10E3/uL   Monocytes Absolute 0.6 0 - 0 x10E3/uL   EOS (ABSOLUTE) 0.2  0.0 - 0.4 x10E3/uL   Basophils Absolute 0.1 0 - 0 x10E3/uL   Immature Granulocytes 0 Not Estab. %   Immature Grans (Abs) 0.0 0.0 - 0.1 x10E3/uL  B12 and Folate Panel     Status: None   Collection Time: 03/10/20  2:34 PM  Result Value Ref Range   Vitamin B-12 298 232 - 1,245 pg/mL   Folate 13.0 >3.0 ng/mL    Comment: A serum folate concentration of less than 3.1 ng/mL is considered to represent clinical deficiency.   CBC with Differential/Platelet     Status: Abnormal   Collection Time: 04/23/20  2:37 PM  Result Value Ref Range   WBC 6.6 3.4 - 10.8 x10E3/uL   RBC 2.80 (L) 3.77 - 5.28 x10E6/uL   Hemoglobin 9.4 (L) 11.1 - 15.9 g/dL   Hematocrit 27.8 (L) 34.0 - 46.6 %   MCV 99 (H) 79 - 97 fL   MCH 33.6 (H) 26.6 - 33.0 pg   MCHC 33.8 31 - 35 g/dL   RDW 11.4 (L) 11.7 - 15.4 %   Platelets 265 150 - 450 x10E3/uL   Neutrophils 65 Not Estab. %   Lymphs 25 Not Estab. %   Monocytes 7 Not Estab. %   Eos 2 Not Estab. %   Basos 1 Not Estab. %   Neutrophils Absolute 4.3 1 - 7 x10E3/uL   Lymphocytes Absolute 1.6 0 - 3 x10E3/uL   Monocytes Absolute 0.4 0 - 0 x10E3/uL   EOS (ABSOLUTE) 0.2 0.0 - 0.4 x10E3/uL   Basophils Absolute 0.1 0 - 0 x10E3/uL   Immature Granulocytes 0 Not Estab. %   Immature Grans (Abs) 0.0 0.0 - 0.1 x10E3/uL  B12 and Folate Panel     Status: Abnormal   Collection Time: 04/23/20  2:37 PM  Result Value Ref Range   Vitamin B-12 1,727 (H) 232 - 1,245 pg/mL   Folate 9.9 >3.0 ng/mL  Comment: A serum folate concentration of less than 3.1 ng/mL is considered to represent clinical deficiency.   Ferritin     Status: Abnormal   Collection Time: 04/23/20  2:37 PM  Result Value Ref Range   Ferritin 284 (H) 15.0 - 150.0 ng/mL  Specimen status report     Status: None   Collection Time: 04/23/20  2:37 PM  Result Value Ref Range   specimen status report Comment     Comment: Written Authorization Written Authorization Written Authorization Received. Authorization received  from [facility] 05-13-2020 Logged by Lawernce Pitts   POCT URINALYSIS DIP (CLINITEK)     Status: Abnormal   Collection Time: 05/12/20  3:17 PM  Result Value Ref Range   Color, UA yellow yellow   Clarity, UA clear clear   Glucose, UA negative negative mg/dL   Bilirubin, UA negative negative   Ketones, POC UA negative negative mg/dL   Spec Grav, UA 6.195 0.932 - 1.025   Blood, UA negative negative   pH, UA 6.5 5.0 - 8.0   POC PROTEIN,UA negative negative, trace   Urobilinogen, UA 0.2 0.2 or 1.0 E.U./dL   Nitrite, UA Negative Negative   Leukocytes, UA Small (1+) (A) Negative  Urine Culture     Status: None   Collection Time: 05/12/20  3:20 PM   Specimen: Urine  Result Value Ref Range   MICRO NUMBER: [id number]    SPECIMEN QUALITY: Adequate    Sample Source NOT GIVEN    STATUS: FINAL    Result: No Growth       Mental Status Examination;   Psychiatric Specialty Exam: Physical Exam  Review of Systems  Cardiovascular: Negative for chest pain.  Psychiatric/Behavioral: Negative for suicidal ideas.    There were no vitals taken for this visit.There is no height or weight on file to calculate BMI.  General Appearance:   Eye Contact::    Speech:  Slow  Volume:  Decreased  Mood: somewhat subdued  Affect:  congruent  Thought Process:  Coherent and intact  Orientation:  Full (Time, Place, and Person)  Thought Content:  Rumination  Suicidal Thoughts:  No  Homicidal Thoughts:  No  Memory:  Immediate;   Fair Recent;   Fair  Judgement:  Fair  Insight:  Shallow  Psychomotor Activity:  Normal  Concentration:  Fair  Recall:  Fair  Akathisia:  Negative  Handed:  Right  AIMS (if indicated):     Assets:  Social Support Vocational/Educational  Sleep:        Assessment: Axis I: Panic disorder. Possible PTSD. Rule out generalized anxiety disorder. Nicotine dependence  Axis II: Deferred  Axis III:  Past Medical History:  Diagnosis Date  . Acid reflux   . Anxiety    . High blood pressure   . High serum parathyroid hormone (PTH) 07/19/2017   96 06/28/2014, 162 12/29/16  . Hypothyroid   . PTSD (post-traumatic stress disorder)     Axis IV: Psychosocial. History of trauma   Treatment Plan and Summary:  Anxiety and GAD: get anxious with change, does not want ssri .,continue klonopine  Weight loss with nausea; will try remeron smallest dose also to help sleep and depression , feels subdued at times PTSD: baseline, work on distractions,  Continue with PCP to work on medical concerns I discussed the assessment and treatment plan with the patient. The patient was provided an opportunity to ask questions and all were answered. The patient agreed with the plan and demonstrated  an understanding of the instructions.   The patient was advised to call back or seek an in-person evaluation if the symptoms worsen or if the condition fails to improve as anticipated.  I provided 15- 20  minutes of non-face-to-face time during this encounter. Fu 4w or earlier if needed Thresa Ross, MD 05/25/2020

## 2020-05-27 NOTE — Addendum Note (Signed)
Addended by: Jed Limerick on: 05/27/2020 11:27 AM   Modules accepted: Orders

## 2020-05-28 LAB — COMPREHENSIVE METABOLIC PANEL
ALT: 6 IU/L (ref 0–32)
AST: 15 IU/L (ref 0–40)
Albumin/Globulin Ratio: 1.7 (ref 1.2–2.2)
Albumin: 4.4 g/dL (ref 3.8–4.8)
Alkaline Phosphatase: 62 IU/L (ref 48–121)
BUN/Creatinine Ratio: 14 (ref 12–28)
BUN: 14 mg/dL (ref 8–27)
Bilirubin Total: <0.2 mg/dL (ref 0.0–1.2)
CO2: 29 mmol/L (ref 20–29)
Calcium: 9.7 mg/dL (ref 8.7–10.3)
Chloride: 94 mmol/L — ABNORMAL LOW (ref 96–106)
Creatinine, Ser: 1.02 mg/dL — ABNORMAL HIGH (ref 0.57–1.00)
GFR calc Af Amer: 65 mL/min/{1.73_m2} (ref >59)
GFR calc non Af Amer: 56 mL/min/{1.73_m2} — ABNORMAL LOW (ref >59)
Globulin, Total: 2.6 g/dL (ref 1.5–4.5)
Glucose: 89 mg/dL (ref 65–99)
Potassium: 4 mmol/L (ref 3.5–5.2)
Sodium: 137 mmol/L (ref 134–144)
Total Protein: 7 g/dL (ref 6.0–8.5)

## 2020-05-28 LAB — CBC WITH DIFFERENTIAL/PLATELET
Basophils Absolute: 0.1 10*3/uL (ref 0.0–0.2)
Basos: 1 %
EOS (ABSOLUTE): 0.1 10*3/uL (ref 0.0–0.4)
Eos: 2 %
Hematocrit: 28.4 % — ABNORMAL LOW (ref 34.0–46.6)
Hemoglobin: 9.6 g/dL — ABNORMAL LOW (ref 11.1–15.9)
Immature Grans (Abs): 0 10*3/uL (ref 0.0–0.1)
Immature Granulocytes: 0 %
Lymphocytes Absolute: 2 10*3/uL (ref 0.7–3.1)
Lymphs: 30 %
MCH: 33.1 pg — ABNORMAL HIGH (ref 26.6–33.0)
MCHC: 33.8 g/dL (ref 31.5–35.7)
MCV: 98 fL — ABNORMAL HIGH (ref 79–97)
Monocytes Absolute: 0.6 10*3/uL (ref 0.1–0.9)
Monocytes: 9 %
Neutrophils Absolute: 3.8 10*3/uL (ref 1.4–7.0)
Neutrophils: 58 %
Platelets: 291 10*3/uL (ref 150–450)
RBC: 2.9 x10E6/uL — ABNORMAL LOW (ref 3.77–5.28)
RDW: 10.6 % — ABNORMAL LOW (ref 11.7–15.4)
WBC: 6.6 10*3/uL (ref 3.4–10.8)

## 2020-05-28 LAB — FERRITIN: Ferritin: 260 ng/mL — ABNORMAL HIGH (ref 15–150)

## 2020-05-28 LAB — VITAMIN B12: Vitamin B-12: 640 pg/mL (ref 232–1245)

## 2020-05-29 ENCOUNTER — Telehealth (INDEPENDENT_AMBULATORY_CARE_PROVIDER_SITE_OTHER): Payer: Medicare Other | Admitting: Physician Assistant

## 2020-05-29 VITALS — Ht 63.0 in | Wt 105.0 lb

## 2020-05-29 DIAGNOSIS — D638 Anemia in other chronic diseases classified elsewhere: Secondary | ICD-10-CM

## 2020-05-29 DIAGNOSIS — F418 Other specified anxiety disorders: Secondary | ICD-10-CM

## 2020-05-29 DIAGNOSIS — N1831 Chronic kidney disease, stage 3a: Secondary | ICD-10-CM | POA: Diagnosis not present

## 2020-05-29 DIAGNOSIS — J432 Centrilobular emphysema: Secondary | ICD-10-CM

## 2020-05-29 DIAGNOSIS — I10 Essential (primary) hypertension: Secondary | ICD-10-CM | POA: Diagnosis not present

## 2020-05-29 DIAGNOSIS — E441 Mild protein-calorie malnutrition: Secondary | ICD-10-CM

## 2020-05-29 DIAGNOSIS — D539 Nutritional anemia, unspecified: Secondary | ICD-10-CM

## 2020-05-29 MED ORDER — ATENOLOL 25 MG PO TABS: 25.0000 mg | ORAL_TABLET | Freq: Every day | ORAL | 3 refills | Status: DC

## 2020-05-29 MED ORDER — DULERA 200-5 MCG/ACT IN AERO: 2.0000 | INHALATION_SPRAY | Freq: Two times a day (BID) | RESPIRATORY_TRACT | 5 refills | Status: DC

## 2020-05-29 NOTE — Progress Notes (Signed)
Patient ID: Claire [name], female   DOB: May 14, 1951, 69 y.o.   MRN: [id number] .[name] KitchenVirtual Visit via Telephone Note  I connected with Claire [name] on 05/29/2020 at  3:40 PM EDT by telephone and verified that I am speaking with the correct person using two identifiers.  Location: Patient: home Provider: clinic   I discussed the limitations, risks, security and privacy concerns of performing an evaluation and management service by telephone and the availability of in person appointments. I also discussed with the patient that there may be a patient responsible charge related to this service. The patient expressed understanding and agreed to proceed.   History of Present Illness: Patient is a 69 year old female with emphysema, hypertension, anemia of chronic disease, CKD 3 who calls into the clinic to go over medications and her overall health.  She recently had labs drawn and wanted to discuss them.  She does feel like her shortness of breath and windedness is getting worse.  She feels like she is using her oxygen more.  She did not try the new Breztri inhaler that was sent.  She was scared to switch.  She is currently still using Dulera 100 mg.  She has increased her eating.  She does feel like she has more energy when she eats more.  She did try the high-calorie boost and they made her feel terrible.  She will not be doing those anymore.  .. Active Ambulatory Problems    Diagnosis Date Noted  . PTSD (post-traumatic stress disorder) 11/10/2011  . Neurosis, anxiety, panic type 11/10/2011  . Acquired hypothyroidism 11/24/2014  . BP (high blood pressure) 07/24/2012  . Chronic use of benzodiazepine for therapeutic purpose 08/11/2015  . Hyponatremia 12/19/2016  . Hyperkalemia 12/19/2016  . Centrilobular emphysema (HCC) 12/20/2016  . Stage 3a chronic kidney disease 01/16/2017  . Iron deficiency anemia 01/16/2017  . Anemia of chronic disease 05/11/2017  . Low serum vitamin B12 05/14/2017  .  Vitamin D deficiency 05/14/2017  . Depressed mood 05/14/2017  . High serum parathyroid hormone (PTH) 07/19/2017  . Normocytic anemia 07/19/2017  . DOE (dyspnea on exertion) 05/09/2018  . Recurrent UTI 05/07/2019  . Leg cramps 05/29/2019  . No energy 05/29/2019  . History of hypokalemia 10/09/2019  . Vaccine counseling 12/24/2019  . Menopausal vaginal dryness 12/24/2019  . Chronic rhinitis 01/31/2020  . Chronic respiratory failure with hypoxia (HCC) 01/31/2020  . Weight loss 02/19/2020  . Macrocytic anemia 03/10/2020  . Elevated ferritin 03/10/2020  . Irritable bowel syndrome with diarrhea 03/10/2020  . Anxiety about health 03/23/2020  . Pernicious anemia 03/23/2020  . Malnutrition of mild degree (HCC) 05/06/2020  . BMI less than 19,adult 05/08/2020   Resolved Ambulatory Problems    Diagnosis Date Noted  . Hypothyroid   . Acute renal failure (HCC) 12/19/2016  . Decreased hemoglobin 12/20/2016  . Acute kidney injury (HCC) 03/17/2017   Past Medical History:  Diagnosis Date  . Acid reflux   . Anxiety   . High blood pressure    Reviewed med, allergy, problem list.     Observations/Objective: No acute distress Anxious mood.  No breathing difficulty.   .. Today's Vitals   05/29/20 1210  Weight: 105 lb (47.6 kg)  Height: 5\' 3"  (1.6 m)   Body mass index is 18.6 kg/m.     Assessment and Plan: [name] KitchenLacresia was seen today for advice only.  Diagnoses and all orders for this visit:  Anxiety about health  Stage 3a chronic kidney disease  Essential hypertension -     atenolol (TENORMIN) 25 MG tablet; Take 1 tablet (25 mg total) by mouth daily.  Centrilobular emphysema ([place]) -     mometasone-formoterol (DULERA) 200-5 MCG/ACT AERO; Inhale 2 puffs into the lungs 2 (two) times daily.  Anemia of chronic disease  Macrocytic anemia  Malnutrition of mild degree ([place])   Reviewed labs with patient.  Her serum creatinine has actually gone down which means her GFR has  improved to 56.  This is excellent.  Patient's electrolytes are in range except chloride was down just a little. B12 was perfect.  Stay on same dose. Anemia is almost back to baseline of 10.  Her nutrition and iron supplements pain off. Continue to keep good nutrition and hydration.  For breathing because patient is anxious about switching inhalers.  We did increase Dulera to 200 mg twice a day.  We will see how this does in the next month.  Encourage patient to get the Covid vaccine.  Follow-up in 4 weeks.  Follow Up Instructions:    I discussed the assessment and treatment plan with the patient. The patient was provided an opportunity to ask questions and all were answered. The patient agreed with the plan and demonstrated an understanding of the instructions.   The patient was advised to call back or seek an in-person evaluation if the symptoms worsen or if the condition fails to improve as anticipated.  I provided 30 minutes of non-face-to-face time during this encounter.   Iran Planas, PA-C

## 2020-05-29 NOTE — Telephone Encounter (Signed)
Claire [name],   We can discuss at visit today.  B12 perfect.  Anemia continues to improve.  Kidney function even better than 2 months ago. That is great news.

## 2020-05-29 NOTE — Progress Notes (Signed)
Discuss help at home

## 2020-06-02 ENCOUNTER — Encounter: Payer: Self-pay | Admitting: Physician Assistant

## 2020-06-12 ENCOUNTER — Telehealth: Payer: Self-pay | Admitting: Physician Assistant

## 2020-06-12 NOTE — Telephone Encounter (Signed)
error 

## 2020-06-14 ENCOUNTER — Other Ambulatory Visit: Payer: Self-pay | Admitting: Physician Assistant

## 2020-06-15 ENCOUNTER — Telehealth: Payer: Self-pay | Admitting: Physician Assistant

## 2020-06-15 ENCOUNTER — Telehealth (INDEPENDENT_AMBULATORY_CARE_PROVIDER_SITE_OTHER): Payer: Medicare Other | Admitting: Physician Assistant

## 2020-06-15 VITALS — Ht 63.0 in | Wt 105.0 lb

## 2020-06-15 DIAGNOSIS — F17200 Nicotine dependence, unspecified, uncomplicated: Secondary | ICD-10-CM

## 2020-06-15 DIAGNOSIS — J441 Chronic obstructive pulmonary disease with (acute) exacerbation: Secondary | ICD-10-CM | POA: Diagnosis not present

## 2020-06-15 DIAGNOSIS — J9611 Chronic respiratory failure with hypoxia: Secondary | ICD-10-CM | POA: Diagnosis not present

## 2020-06-15 DIAGNOSIS — J40 Bronchitis, not specified as acute or chronic: Secondary | ICD-10-CM

## 2020-06-15 DIAGNOSIS — J432 Centrilobular emphysema: Secondary | ICD-10-CM | POA: Diagnosis not present

## 2020-06-15 DIAGNOSIS — J329 Chronic sinusitis, unspecified: Secondary | ICD-10-CM

## 2020-06-15 MED ORDER — AMOXICILLIN-POT CLAVULANATE 875-125 MG PO TABS: 1.0000 | ORAL_TABLET | Freq: Two times a day (BID) | ORAL | 0 refills | Status: DC

## 2020-06-15 NOTE — Progress Notes (Signed)
Patient ID: Claire [name], female   DOB: [birth year], 69 y.o.   MRN: [id number] .[name] KitchenVirtual Visit via Telephone Note  I connected with Claire [name] on 06/15/2020 at  3:00 PM EDT by telephone and verified that I am speaking with the correct person using two identifiers.  Location: Patient: home Provider: clinic   I discussed the limitations, risks, security and privacy concerns of performing an evaluation and management service by telephone and the availability of in person appointments. I also discussed with the patient that there may be a patient responsible charge related to this service. The patient expressed understanding and agreed to proceed.   History of Present Illness: Pt is a 69 female with COPD, HTN, Chronic respiratory failure, hypothyroidism, CKD 3 who calls into the clinic with sinus concerns.   Pt has a lot of anxiety about health and medications.   She called Friday with sinusitis symptoms. Her daughther and son in law are both sick and she has been around them and now she felt sick. She could not get in and she started prednisone she had at the house 50mg  once a day.  She is feeling some better but still has a lot of mucus production that is yellow green.  She does not to be having worsening shortness of breath.  She denies any fever, body aches, loss of smell or taste.  She has lot of sinus pressure and sinus drainage. She is only using O2 at night right now but feels like she would have to have O2 to leave the house.   She also would like to get her portable oxygen back.  She requests that we call [employer] and speak with because they need something from Barbara Cower to get this ordered.  She is also not able to get any proair refills right now because she lost 2 of her inhalers in her mood and now she needs 1 but they are not letting her refill.  She needs is to call the pharmacy and get this straightened out.   Active Ambulatory Problems    Diagnosis Date Noted  . PTSD  (post-traumatic stress disorder) 11/10/2011  . Neurosis, anxiety, panic type 11/10/2011  . Acquired hypothyroidism 11/24/2014  . BP (high blood pressure) 07/24/2012  . Chronic use of benzodiazepine for therapeutic purpose 08/11/2015  . Hyponatremia 12/19/2016  . Hyperkalemia 12/19/2016  . Centrilobular emphysema (HCC) 12/20/2016  . Stage 3a chronic kidney disease 01/16/2017  . Iron deficiency anemia 01/16/2017  . Anemia of chronic disease 05/11/2017  . Low serum vitamin B12 05/14/2017  . Vitamin D deficiency 05/14/2017  . Depressed mood 05/14/2017  . High serum parathyroid hormone (PTH) 07/19/2017  . Normocytic anemia 07/19/2017  . DOE (dyspnea on exertion) 05/09/2018  . Recurrent UTI 05/07/2019  . Leg cramps 05/29/2019  . No energy 05/29/2019  . History of hypokalemia 10/09/2019  . Vaccine counseling 12/24/2019  . Menopausal vaginal dryness 12/24/2019  . Chronic rhinitis 01/31/2020  . Chronic respiratory failure with hypoxia (HCC) 01/31/2020  . Weight loss 02/19/2020  . Macrocytic anemia 03/10/2020  . Elevated ferritin 03/10/2020  . Irritable bowel syndrome with diarrhea 03/10/2020  . Anxiety about health 03/23/2020  . Pernicious anemia 03/23/2020  . Malnutrition of mild degree (HCC) 05/06/2020  . BMI less than 19,adult 05/08/2020  . Sinobronchitis 06/16/2020   Resolved Ambulatory Problems    Diagnosis Date Noted  . Hypothyroid   . Acute renal failure (HCC) 12/19/2016  . Decreased hemoglobin 12/20/2016  . Acute kidney  injury (HCC) 03/17/2017   Past Medical History:  Diagnosis Date  . Acid reflux   . Anxiety   . High blood pressure    Reviewed med, allergy, problem list.     Observations/Objective: No acute distress Productive cough  No labored breathing.   .. Today's Vitals   06/15/20 1411  Weight: 105 lb (47.6 kg)  Height: 5\' 3"  (1.6 m)   Body mass index is 18.6 kg/m.    Assessment and Plan: [name] KitchenDemetric was seen today for sinus problem.  Diagnoses  and all orders for this visit:  COPD exacerbation (HCC) -     amoxicillin-clavulanate (AUGMENTIN) 875-125 MG tablet; Take 1 tablet by mouth 2 (two) times daily.  Current smoker  Chronic respiratory failure with hypoxia (HCC)  Centrilobular emphysema (HCC)  Sinobronchitis -     amoxicillin-clavulanate (AUGMENTIN) 875-125 MG tablet; Take 1 tablet by mouth 2 (two) times daily.   Will call [employer] to get imaging portable oxygen set up as well as pharmacy to get them to release her to have albuterol.  Explained to the patient she may have to pay out-of-pocket for this if insurance will only cover certain amount in a month.  Discussed COPD exacerbation.  She has improved some since starting prednisone.  Finished prednisone.  Discussed adding an antibiotic.  She declined doxycycline or Levaquin.  She does not tolerate azithromycin.  She would only agree to Augmentin.  Discussed other conservative and symptomatic control of symptoms with Mucinex.  Use oxygen as needed as well as albuterol therapy every 4-6 hours.  Follow-up if symptoms worsen or new symptoms appear.   Follow Up Instructions:    I discussed the assessment and treatment plan with the patient. The patient was provided an opportunity to ask questions and all were answered. The patient agreed with the plan and demonstrated an understanding of the instructions.   The patient was advised to call back or seek an in-person evaluation if the symptoms worsen or if the condition fails to improve as anticipated.  I provided 30 minutes of non-face-to-face time during this encounter.   Rinaldo Cloud, PA-C

## 2020-06-15 NOTE — Telephone Encounter (Signed)
Can we call [facility] patient is wanting to get back her imogen portable oxygen tank. Jason at [place] said they need stuff from [place].   Can we call pharmacy. She lost 2 proair in the move and needs refilled now instead of waiting until it says she needs refill.

## 2020-06-15 NOTE — Telephone Encounter (Signed)
Spoke with [facility] and called in an okay for an early refill of Proair. They will fill but state her insurance won't cover this yet. Will find discount card for patient to try to make cheaper.   Spoke with Kami at [facility] about portable oxygen tank [phone]). She looked into the notes and doesn't see anything about it. She will call me back.

## 2020-06-16 ENCOUNTER — Encounter: Payer: Self-pay | Admitting: Physician Assistant

## 2020-06-16 DIAGNOSIS — J329 Chronic sinusitis, unspecified: Secondary | ICD-10-CM | POA: Insufficient documentation

## 2020-06-16 NOTE — Telephone Encounter (Signed)
Kami is going to send an order form for completion.  

## 2020-06-17 ENCOUNTER — Other Ambulatory Visit (HOSPITAL_COMMUNITY): Payer: Self-pay | Admitting: Psychiatry

## 2020-06-17 NOTE — Telephone Encounter (Signed)
[employer] form completed and faxed with demographics/recent note to [phone] with confirmation received.

## 2020-06-19 ENCOUNTER — Telehealth: Payer: Self-pay

## 2020-06-19 NOTE — Telephone Encounter (Signed)
Patient called into office reporting possible allergic reaction. Patient says both eye are itchy and mildly swelling. Please advise

## 2020-06-19 NOTE — Telephone Encounter (Signed)
She has tolerated augmentin very well in the past. I do not think allergic reaction. More likely allergies in general. Cool compresses. And OTC antihistamine eye drops could help. Call with any body rash.

## 2020-06-19 NOTE — Telephone Encounter (Signed)
Finish augmentin. The white is just your ongoing mucus from your COPD. Let me know how you are doing Monday.

## 2020-06-19 NOTE — Telephone Encounter (Signed)
Patient called into office this morning left voicemail, unable to understand message. Last telephone encounter was sent in error. Attempted to reach patient. Left message on voicemail for patient to return call to the office.

## 2020-06-19 NOTE — Telephone Encounter (Signed)
Patient made aware to continue antibiotic regiment, contact office to let PCP know how she is feeling.

## 2020-06-19 NOTE — Telephone Encounter (Signed)
Patient returned call to office, reports cough with the presence of white mucus coming up when she coughs. She wants to know if she should continue Augmentin that was recently prescribed 06/15/2020. She wants to kniow if she should continue regiment or does she need to be on another medication. Please advise  Thanks

## 2020-06-25 ENCOUNTER — Telehealth (HOSPITAL_COMMUNITY): Payer: Medicare Other | Admitting: Psychiatry

## 2020-06-26 ENCOUNTER — Telehealth (INDEPENDENT_AMBULATORY_CARE_PROVIDER_SITE_OTHER): Payer: Medicare Other | Admitting: Psychiatry

## 2020-06-26 ENCOUNTER — Encounter (HOSPITAL_COMMUNITY): Payer: Self-pay | Admitting: Psychiatry

## 2020-06-26 DIAGNOSIS — F411 Generalized anxiety disorder: Secondary | ICD-10-CM

## 2020-06-26 DIAGNOSIS — F4310 Post-traumatic stress disorder, unspecified: Secondary | ICD-10-CM | POA: Diagnosis not present

## 2020-06-26 NOTE — Progress Notes (Signed)
Patient ID: Claire [name], female   DOB: [birth year], 69 y.o.   MRN: [id number]  [facility] Follow up visit [address] [id number] 69 y.o.  06/26/2020 11:13 AM  Chief Complaint:  Anxiety follow up, med review  History of Present Illness:     I connected with Claire [name] on 06/26/20 at 10:45 AM EDT by telephone and verified that I am speaking with the correct person using two identifiers.  I discussed the limitations, risks, security and privacy concerns of performing an evaluation and management service by telephone and the availability of in person appointments. I also discussed with the patient that there may be a patient responsible charge related to this service. The patient expressed understanding and agreed to proceed.  Patient location home Provider location home   Doing fair since living seperately from daughter New apartment with senior living is better  she tried remeron for sleep and depression, it made her too sleepy and she stopped  she doesn't want to be on any med but klonopin which helps anxiety and indireclty depression  Understands the risk   Modifying factor: breahting techniques. grandkids   PTSD is related to her past some flashbacks. Distraction helps  Depression not worse No side effects     Aggravating factors: past childhood,  No psychosis  Medical History; Past Medical History:  Diagnosis Date  . Acid reflux   . Anxiety   . High blood pressure   . High serum parathyroid hormone (PTH) 07/19/2017   96 06/28/2014, 162 12/29/16  . Hypothyroid   . PTSD (post-traumatic stress disorder)     Allergies: Allergies  Allergen Reactions  . Bactrim [Sulfamethoxazole-Trimethoprim]     ACUTE RENAL fAILURE/HYPONATREMIA/HYPERKALEMIA  . Macrodantin [Nitrofurantoin Macrocrystal] Rash  . Trimethoprim     Fatigue/weakness/no appetite  . Zithromax [Azithromycin]     diarrhea    Medications: Outpatient  Encounter Medications as of 06/26/2020  Medication Sig  . albuterol (PROVENTIL) (2.5 MG/3ML) 0.083% nebulizer solution 1 VIAL IN NEBULIZER EVERY 4 HOURS AS NEEDED WHEEZING OR FOR SHORTNESS OF BREATH  . AMBULATORY NON FORMULARY MEDICATION Continuous stationary and portable oxygen tanks for use with ambulation. DX: COPD with hypoxia.  . AMBULATORY NON FORMULARY MEDICATION Home concentrator and one Imogen G3 portable oxygen concentrator for hypoxia.  . AMBULATORY NON FORMULARY MEDICATION Portable oxygen compressor device for portable O2 administration continuous 2L/min while walking. Dx COPD w/ hypoxia. Titrate for OCD (oxygen conserving device).  . AMBULATORY NON FORMULARY MEDICATION One pulse ox to manage SOB and O2 use.  . AMBULATORY NON FORMULARY MEDICATION Portable oxygen concentrator at 2L of continuous oxygen to use with ambulation for COPD and chronic hypoxemia.  [name] amoxicillin-clavulanate (AUGMENTIN) 875-125 MG tablet Take 1 tablet by mouth 2 (two) times daily.  [name] atenolol (TENORMIN) 25 MG tablet Take 1 tablet (25 mg total) by mouth daily.  . cetirizine (ZYRTEC) 10 MG chewable tablet Chew 10 mg by mouth daily.  . cholecalciferol (VITAMIN D) 25 MCG (1000 UT) tablet Take 1,000 Units by mouth daily.  . clonazePAM (KLONOPIN) 0.5 MG tablet TAKE 1 TABLET BY MOUTH 3 TIMES A DAY  . dextromethorphan-guaiFENesin (MUCINEX DM) 30-600 MG 12hr tablet Take 1 tablet by mouth 2 (two) times daily.  [name] dicyclomine (BENTYL) 10 MG capsule Take 1 capsule (10 mg total) by mouth 3 (three) times daily before meals.  [name] estradiol (ESTRACE VAGINAL) 0.1 MG/GM vaginal cream Place 1 Applicatorful vaginally 3 (three) times a week. And one  pea-size amt applied to urethral opening.  . famotidine (PEPCID) 20 MG tablet TAKE 1 TABLET BY MOUTH TWICE A DAY  . ferrous sulfate 325 (65 FE) MG EC tablet 1 tab PO TID every other day  . fexofenadine (ALLEGRA ALLERGY) 60 MG tablet Take 1 tablet (60 mg total) by mouth 2 (two) times daily.   . fluticasone (FLONASE) 50 MCG/ACT nasal spray Place 2 sprays into both nostrils daily.  [name] levothyroxine (SYNTHROID) 75 MCG tablet TAKE 1 TABLET BY MOUTH EVERY DAY  . Magnesium 250 MG TABS Take 250 mg by mouth. Take one tablet daily  . mometasone (NASONEX) 50 MCG/ACT nasal spray One spray in each nostril twice a day, use left hand for right nostril, and right hand for left nostril.  Please dispense one bottle.  . mometasone-formoterol (DULERA) 200-5 MCG/ACT AERO Inhale 2 puffs into the lungs 2 (two) times daily.  [name] PROAIR HFA 108 (90 Base) MCG/ACT inhaler INHALE 1-2 PUFFS INTO THE LUNGS EVERY 4 (FOUR) HOURS AS NEEDED FOR WHEEZING OR SHORTNESS OF BREATH.  . [DISCONTINUED] mirtazapine (REMERON) 7.5 MG tablet TAKE 1 TABLET (7.5 MG TOTAL) BY MOUTH AT BEDTIME.   No facility-administered encounter medications on file as of 06/26/2020.    Family History; Family History  Problem Relation Age of Onset  . Anxiety disorder Mother   . OCD Daughter   . Alcohol abuse Father   . Anxiety disorder Sister        Labs:  Recent Results (from the past 2160 hour(s))  CBC with Differential/Platelet     Status: Abnormal   Collection Time: 04/23/20  2:37 PM  Result Value Ref Range   WBC 6.6 3.4 - 10.8 x10E3/uL   RBC 2.80 (L) 3.77 - 5.28 x10E6/uL   Hemoglobin 9.4 (L) 11.1 - 15.9 g/dL   Hematocrit 27.8 (L) 34.0 - 46.6 %   MCV 99 (H) 79 - 97 fL   MCH 33.6 (H) 26.6 - 33.0 pg   MCHC 33.8 31 - 35 g/dL   RDW 11.4 (L) 11.7 - 15.4 %   Platelets 265 150 - 450 x10E3/uL   Neutrophils 65 Not Estab. %   Lymphs 25 Not Estab. %   Monocytes 7 Not Estab. %   Eos 2 Not Estab. %   Basos 1 Not Estab. %   Neutrophils Absolute 4.3 1 - 7 x10E3/uL   Lymphocytes Absolute 1.6 0 - 3 x10E3/uL   Monocytes Absolute 0.4 0 - 0 x10E3/uL   EOS (ABSOLUTE) 0.2 0.0 - 0.4 x10E3/uL   Basophils Absolute 0.1 0 - 0 x10E3/uL   Immature Granulocytes 0 Not Estab. %   Immature Grans (Abs) 0.0 0.0 - 0.1 x10E3/uL  B12 and Folate Panel      Status: Abnormal   Collection Time: 04/23/20  2:37 PM  Result Value Ref Range   Vitamin B-12 1,727 (H) 232 - 1,245 pg/mL   Folate 9.9 >3.0 ng/mL    Comment: A serum folate concentration of less than 3.1 ng/mL is considered to represent clinical deficiency.   Ferritin     Status: Abnormal   Collection Time: 04/23/20  2:37 PM  Result Value Ref Range   Ferritin 284 (H) 15.0 - 150.0 ng/mL  Specimen status report     Status: None   Collection Time: 04/23/20  2:37 PM  Result Value Ref Range   specimen status report Comment     Comment: Written Authorization Written Authorization Written Authorization Received. Authorization received from [facility] 05-13-2020 Logged by  Valentino Saxon   POCT URINALYSIS DIP (CLINITEK)     Status: Abnormal   Collection Time: 05/12/20  3:17 PM  Result Value Ref Range   Color, UA yellow yellow   Clarity, UA clear clear   Glucose, UA negative negative mg/dL   Bilirubin, UA negative negative   Ketones, POC UA negative negative mg/dL   Spec Grav, UA 1.015 1.010 - 1.025   Blood, UA negative negative   pH, UA 6.5 5.0 - 8.0   POC PROTEIN,UA negative negative, trace   Urobilinogen, UA 0.2 0.2 or 1.0 E.U./dL   Nitrite, UA Negative Negative   Leukocytes, UA Small (1+) (A) Negative  Urine Culture     Status: None   Collection Time: 05/12/20  3:20 PM   Specimen: Urine  Result Value Ref Range   MICRO NUMBER: [id number]    SPECIMEN QUALITY: Adequate    Sample Source NOT GIVEN    STATUS: FINAL    Result: No Growth   B12     Status: None   Collection Time: 05/27/20  1:20 PM  Result Value Ref Range   Vitamin B-12 640 232 - 1,245 pg/mL  CBC with Differential     Status: Abnormal   Collection Time: 05/27/20  1:20 PM  Result Value Ref Range   WBC 6.6 3.4 - 10.8 x10E3/uL   RBC 2.90 (L) 3.77 - 5.28 x10E6/uL   Hemoglobin 9.6 (L) 11.1 - 15.9 g/dL   Hematocrit 28.4 (L) 34.0 - 46.6 %   MCV 98 (H) 79 - 97 fL   MCH 33.1 (H) 26.6 - 33.0 pg   MCHC 33.8 31 - 35  g/dL   RDW 10.6 (L) 11.7 - 15.4 %   Platelets 291 150 - 450 x10E3/uL   Neutrophils 58 Not Estab. %   Lymphs 30 Not Estab. %   Monocytes 9 Not Estab. %   Eos 2 Not Estab. %   Basos 1 Not Estab. %   Neutrophils Absolute 3.8 1 - 7 x10E3/uL   Lymphocytes Absolute 2.0 0 - 3 x10E3/uL   Monocytes Absolute 0.6 0 - 0 x10E3/uL   EOS (ABSOLUTE) 0.1 0.0 - 0.4 x10E3/uL   Basophils Absolute 0.1 0 - 0 x10E3/uL   Immature Granulocytes 0 Not Estab. %   Immature Grans (Abs) 0.0 0.0 - 0.1 x10E3/uL  Ferritin     Status: Abnormal   Collection Time: 05/27/20  1:20 PM  Result Value Ref Range   Ferritin 260 (H) 15.0 - 150.0 ng/mL  Comprehensive Metabolic Panel (CMET)     Status: Abnormal   Collection Time: 05/27/20  1:20 PM  Result Value Ref Range   Glucose 89 65 - 99 mg/dL   BUN 14 8 - 27 mg/dL   Creatinine, Ser 1.02 (H) 0.57 - 1.00 mg/dL   GFR calc non Af Amer 56 (L) >59 mL/min/1.73   GFR calc Af Amer 65 >59 mL/min/1.73    Comment: **[facility] currently reports eGFR in compliance with the current**   recommendations of the [employer]. [facility] will   update reporting as new guidelines are published from the NKF-ASN   Task force.    BUN/Creatinine Ratio 14 12 - 28   Sodium 137 134 - 144 mmol/L   Potassium 4.0 3.5 - 5.2 mmol/L   Chloride 94 (L) 96 - 106 mmol/L   CO2 29 20 - 29 mmol/L   Calcium 9.7 8.7 - 10.3 mg/dL   Total Protein 7.0 6.0 - 8.5 g/dL   Albumin  4.4 3.8 - 4.8 g/dL   Globulin, Total 2.6 1.5 - 4.5 g/dL   Albumin/Globulin Ratio 1.7 1.2 - 2.2   Bilirubin Total <0.2 0.0 - 1.2 mg/dL   Alkaline Phosphatase 62 48 - 121 IU/L   AST 15 0 - 40 IU/L   ALT 6 0 - 32 IU/L      Mental Status Examination;   Psychiatric Specialty Exam: Physical Exam  Review of Systems  Cardiovascular: Negative for chest pain.  Psychiatric/Behavioral: Negative for depression and suicidal ideas.    There were no vitals taken for this visit.There is no height or weight on file to calculate BMI.   General Appearance:   Eye Contact::    Speech:  Slow  Volume:  Decreased  Mood: fair  Affect:  congruent  Thought Process:  Coherent and intact  Orientation:  Full (Time, Place, and Person)  Thought Content:  Rumination  Suicidal Thoughts:  No  Homicidal Thoughts:  No  Memory:  Immediate;   Fair Recent;   Fair  Judgement:  Fair  Insight:  Shallow  Psychomotor Activity:  Normal  Concentration:  Fair  Recall:  Fair  Akathisia:  Negative  Handed:  Right  AIMS (if indicated):     Assets:  Social Support Vocational/Educational  Sleep:        Assessment: Axis I: Panic disorder. Possible PTSD. Rule out generalized anxiety disorder. Nicotine dependence  Axis II: Deferred  Axis III:  Past Medical History:  Diagnosis Date  . Acid reflux   . Anxiety   . High blood pressure   . High serum parathyroid hormone (PTH) 07/19/2017   96 06/28/2014, 162 12/29/16  . Hypothyroid   . PTSD (post-traumatic stress disorder)     Axis IV: Psychosocial. History of trauma   Treatment Plan and Summary:  Anxiety and GAD: fair on klonopine, does not want to change   PTSD: baseline. continuie disraction techniques Does not want to be on sSRi  But just klonopine    I discussed the assessment and treatment plan with the patient. The patient was provided an opportunity to ask questions and all were answered. The patient agreed with the plan and demonstrated an understanding of the instructions.   The patient was advised to call back or seek an in-person evaluation if the symptoms worsen or if the condition fails to improve as anticipated.  I provided 15 minutes of non-face-to-face time during this encounter. Fu 47m   NMerian Capron MD 06/26/2020

## 2020-06-29 ENCOUNTER — Other Ambulatory Visit: Payer: Self-pay | Admitting: Physician Assistant

## 2020-07-07 ENCOUNTER — Telehealth: Payer: Self-pay

## 2020-07-07 NOTE — Telephone Encounter (Signed)
Pt inquired if it is ok to take the COVID vaccine since she has a hx of gallstones and is currently experiencing a "heaviness" in the right mid-abdominal area.  Per [name], advised pt that it is okay for her to take the COVID vaccine.  Pt also informed of timing for repeat labs.  Pt expressed understanding.  Tiajuana Amass, CMA

## 2020-07-07 NOTE — Telephone Encounter (Signed)
Beginning of September should be fine.

## 2020-07-07 NOTE — Telephone Encounter (Signed)
Pt called stating that she is still taking ferrous sulfate for anemia and is inquiring when she should have labs repeated.  Please advise.  Tiajuana Amass, CMA

## 2020-07-08 ENCOUNTER — Other Ambulatory Visit: Payer: Self-pay

## 2020-07-08 DIAGNOSIS — J432 Centrilobular emphysema: Secondary | ICD-10-CM

## 2020-07-08 MED ORDER — DULERA 200-5 MCG/ACT IN AERO: 2.0000 | INHALATION_SPRAY | Freq: Two times a day (BID) | RESPIRATORY_TRACT | 5 refills | Status: DC

## 2020-07-08 MED ORDER — LEVOTHYROXINE SODIUM 75 MCG PO TABS: 75.0000 ug | ORAL_TABLET | Freq: Every day | ORAL | 3 refills | Status: DC

## 2020-07-08 MED ORDER — ALBUTEROL SULFATE HFA 108 (90 BASE) MCG/ACT IN AERS: INHALATION_SPRAY | RESPIRATORY_TRACT | 2 refills | Status: DC

## 2020-07-08 NOTE — Telephone Encounter (Signed)
[facility] called requesting refills for [facility], albuterol and levothyroxine since the pt has recently changed to their pharmacy.  RXs sent to [facility].  Tiajuana Amass, CMA

## 2020-07-09 ENCOUNTER — Telehealth (INDEPENDENT_AMBULATORY_CARE_PROVIDER_SITE_OTHER): Payer: Medicare Other | Admitting: Nurse Practitioner

## 2020-07-09 ENCOUNTER — Other Ambulatory Visit: Payer: Self-pay | Admitting: Neurology

## 2020-07-09 ENCOUNTER — Encounter: Payer: Self-pay | Admitting: Nurse Practitioner

## 2020-07-09 DIAGNOSIS — J432 Centrilobular emphysema: Secondary | ICD-10-CM | POA: Diagnosis not present

## 2020-07-09 DIAGNOSIS — J441 Chronic obstructive pulmonary disease with (acute) exacerbation: Secondary | ICD-10-CM | POA: Diagnosis not present

## 2020-07-09 DIAGNOSIS — B9689 Other specified bacterial agents as the cause of diseases classified elsewhere: Secondary | ICD-10-CM

## 2020-07-09 DIAGNOSIS — J988 Other specified respiratory disorders: Secondary | ICD-10-CM | POA: Diagnosis not present

## 2020-07-09 DIAGNOSIS — J22 Unspecified acute lower respiratory infection: Secondary | ICD-10-CM

## 2020-07-09 DIAGNOSIS — M1990 Unspecified osteoarthritis, unspecified site: Secondary | ICD-10-CM | POA: Diagnosis not present

## 2020-07-09 MED ORDER — LIDOCAINE 5 % EX PTCH: 1.0000 | MEDICATED_PATCH | CUTANEOUS | 0 refills | Status: DC

## 2020-07-09 MED ORDER — CIPROFLOXACIN HCL 500 MG PO TABS: 500.0000 mg | ORAL_TABLET | Freq: Two times a day (BID) | ORAL | 0 refills | Status: DC

## 2020-07-09 MED ORDER — ALBUTEROL SULFATE HFA 108 (90 BASE) MCG/ACT IN AERS: INHALATION_SPRAY | RESPIRATORY_TRACT | 0 refills | Status: DC

## 2020-07-09 MED ORDER — PREDNISONE 20 MG PO TABS: 40.0000 mg | ORAL_TABLET | Freq: Every day | ORAL | 0 refills | Status: DC

## 2020-07-09 MED ORDER — DULERA 100-5 MCG/ACT IN AERO: INHALATION_SPRAY | RESPIRATORY_TRACT | 6 refills | Status: DC

## 2020-07-09 MED ORDER — DM-GUAIFENESIN ER 30-600 MG PO TB12: 1.0000 | ORAL_TABLET | Freq: Two times a day (BID) | ORAL | 0 refills | Status: DC

## 2020-07-09 NOTE — Progress Notes (Signed)
Virtual Visit via Telephone Note  I connected with  Claire [name] on 07/09/20 at  1:30 PM EDT by telephone and verified that I am speaking with the correct person using two identifiers.   I discussed the limitations, risks, security and privacy concerns of performing an evaluation and management service by telephone and the availability of in person appointments. I also discussed with the patient that there may be a patient responsible charge related to this service. The patient expressed understanding and agreed to proceed.  The patient is: at home I am: in the office  Subjective:    CC: Cough, increased mucous production, sore throat, chest congestion, sinus congestion.   Right sided hip/abdominal/pelvic area pain  HPI: COPD Exacerbation: She reports that she is continuing to have symptoms of COPD exacerbation with frequent purulent cough and increased mucous production. She has finished the Augmentin and feels that it helped some, but not completely with her symptoms.  She reports cough, increased mucous production, sore throat, chest congestion, sinus congestion, decreased appetite, shortness of breath with exertion She denies fever, chills, shortness of breath at rest, dizziness, confusion, or feelings like she cannot get enough oxygen.   HIP/ABDOMEN/PELVIC PAIN: She reports that she has been experiencing pain in her right lower hip/pelvic area for the past few weeks. She describes the pain as an ache that is worse when she is up and moving around, but gets better when she sits on the heating pad or lays in bed.  She reports the pain improves when she takes Tylenol, but does not subside completely.  She does report a history of an injury to this hip a few years ago, but she never was seen for the issue. She expressed concern over her gallbladder, but describes the pain as low on the right side near her hip and pelvis.   Past medical history, Surgical history, Family history not  pertinant except as noted below, Social history, Allergies, and medications have been entered into the medical record, reviewed, and corrections made.     Objective:    General: Speaking clearly in complete sentences without any shortness of breath.  Alert and oriented x3.  Normal judgment. No apparent acute distress. She is audible congested and has a frequent, productive cough.   Impression and Recommendations:   1. COPD exacerbation (HCC) 2. Bacterial lower respiratory infection Symptoms and presentation consistent with COPD exacerbation with bacterial component with ongoing symtpoms for the past 4-6 weeks. She was on a prednisone burst about a month ago and showed some improvement, but completed the course. Approximately 3 weeks ago she started Augmentin, but has completed this and is still experiencing symptoms.  She declined doxycycline and levofloxacin today, but agreed to try ciprofloxacin.  Continue conservative treatments with Mucinex, refills provided.  Will start another prednisone burst for 5 days. Continue to use oxygen and inhalers as prescribed.  Dulera inhaler changed to 100mg  dose from 200mg  as patient unable to tolerate higher dosage.  Follow-up if symptoms are not improved by next week.   3. Arthritis Symptoms consistent with arthritic pain of the right hip given history of injury and arthritis in other joints, this is likely. Bursitis may also be present.  We discussed Tylenol to help with the pain during the day and use of ice and heating pad, which she reports has been helpful.  We also discussed trial of lidocaine patch to the hip to see if this offers her any relief from the pain. She is agreeable  to try this.  Prescription for lidocaine sent to the pharmacy with instructions to utilize OTC strength if the prescription strength is not covered by the patients insurance.  We discussed if she continues to have pain in the hip/pelvic region we may need to consider imaging  to make sure there is not an injury present or something else going on in her intestinal tract. The pain seems more consistent with joint pain at this time.  She is agreeable to this plan.   Follow-up if symptoms worsen or fail to improve.    I discussed the assessment and treatment plan with the patient. The patient was provided an opportunity to ask questions and all were answered. The patient agreed with the plan and demonstrated an understanding of the instructions.   The patient was advised to call back or seek an in-person evaluation if the symptoms worsen or if the condition fails to improve as anticipated.  I provided 30 minutes of non-face-to-face time during this TELEPHONE encounter.    Tollie Eth, NP

## 2020-07-09 NOTE — Telephone Encounter (Signed)
[facility] pharmacy called asking if okay to change patient to [facility] 100 mg instead of 200 mg because patient states it is "too strong" for her. Please advise. RX pended. (This is written for 2 puffs two times daily, maybe patient could do 1 puff twice daily?)  Proair also changed to 90 day supply per request.

## 2020-07-10 MED ORDER — DULERA 100-5 MCG/ACT IN AERO: 2.0000 | INHALATION_SPRAY | Freq: Two times a day (BID) | RESPIRATORY_TRACT | 1 refills | Status: DC

## 2020-07-13 ENCOUNTER — Encounter: Payer: Self-pay | Admitting: Nurse Practitioner

## 2020-07-13 ENCOUNTER — Ambulatory Visit (INDEPENDENT_AMBULATORY_CARE_PROVIDER_SITE_OTHER): Payer: Medicare Other | Admitting: Nurse Practitioner

## 2020-07-13 VITALS — BP 117/79 | HR 96 | Temp 97.6°F | Ht 63.0 in | Wt 100.0 lb

## 2020-07-13 DIAGNOSIS — E441 Mild protein-calorie malnutrition: Secondary | ICD-10-CM | POA: Diagnosis not present

## 2020-07-13 DIAGNOSIS — R1084 Generalized abdominal pain: Secondary | ICD-10-CM

## 2020-07-13 DIAGNOSIS — K5903 Drug induced constipation: Secondary | ICD-10-CM

## 2020-07-13 DIAGNOSIS — Z681 Body mass index (BMI) 19 or less, adult: Secondary | ICD-10-CM

## 2020-07-13 DIAGNOSIS — J9611 Chronic respiratory failure with hypoxia: Secondary | ICD-10-CM | POA: Diagnosis not present

## 2020-07-13 DIAGNOSIS — F410 Panic disorder [episodic paroxysmal anxiety] without agoraphobia: Secondary | ICD-10-CM

## 2020-07-13 DIAGNOSIS — F418 Other specified anxiety disorders: Secondary | ICD-10-CM | POA: Diagnosis not present

## 2020-07-13 DIAGNOSIS — K5900 Constipation, unspecified: Secondary | ICD-10-CM | POA: Insufficient documentation

## 2020-07-13 MED ORDER — ESCITALOPRAM OXALATE 20 MG PO TABS: ORAL_TABLET | ORAL | 1 refills | Status: DC

## 2020-07-13 NOTE — Patient Instructions (Addendum)
Probiotic for you belly to help replace the bacteria in your intestines.   You can take colace (docusate sodium) one to three times daily to help with constipation caused from the iron supplement you are taking.   Try drinking a boost or ensure or another high protein drink IN ADDITION to eating three meals a day  Increase your water intake to make sure you are well hydrated.   Escitalopram tablets What is this medicine? ESCITALOPRAM (es sye TAL oh pram) is used to treat depression and certain types of anxiety. This medicine may be used for other purposes; ask your health care provider or pharmacist if you have questions. COMMON BRAND NAME(S): Lexapro What should I tell my health care provider before I take this medicine? They need to know if you have any of these conditions:  bipolar disorder or a family history of bipolar disorder  diabetes  glaucoma  heart disease  kidney or liver disease  receiving electroconvulsive therapy  seizures (convulsions)  suicidal thoughts, plans, or attempt by you or a family member  an unusual or allergic reaction to escitalopram, the related drug citalopram, other medicines, foods, dyes, or preservatives  pregnant or trying to become pregnant  breast-feeding How should I use this medicine? Take this medicine by mouth with a glass of water. Follow the directions on the prescription label. You can take it with or without food. If it upsets your stomach, take it with food. Take your medicine at regular intervals. Do not take it more often than directed. Do not stop taking this medicine suddenly except upon the advice of your doctor. Stopping this medicine too quickly may cause serious side effects or your condition may worsen. A special MedGuide will be given to you by the pharmacist with each prescription and refill. Be sure to read this information carefully each time. Talk to your pediatrician regarding the use of this medicine in children.  Special care may be needed. Overdosage: If you think you have taken too much of this medicine contact a poison control center or emergency room at once. NOTE: This medicine is only for you. Do not share this medicine with others. What if I miss a dose? If you miss a dose, take it as soon as you can. If it is almost time for your next dose, take only that dose. Do not take double or extra doses. What may interact with this medicine? Do not take this medicine with any of the following medications:  certain medicines for fungal infections like fluconazole, itraconazole, ketoconazole, posaconazole, voriconazole  cisapride  citalopram  dronedarone  linezolid  MAOIs like Carbex, Eldepryl, Marplan, Nardil, and Parnate  methylene blue (injected into a vein)  pimozide  thioridazine This medicine may also interact with the following medications:  alcohol  amphetamines  aspirin and aspirin-like medicines  carbamazepine  certain medicines for depression, anxiety, or psychotic disturbances  certain medicines for migraine headache like almotriptan, eletriptan, frovatriptan, naratriptan, rizatriptan, sumatriptan, zolmitriptan  certain medicines for sleep  certain medicines that treat or prevent blood clots like warfarin, enoxaparin, dalteparin  cimetidine  diuretics  dofetilide  fentanyl  furazolidone  isoniazid  lithium  metoprolol  NSAIDs, medicines for pain and inflammation, like ibuprofen or naproxen  other medicines that prolong the QT interval (cause an abnormal heart rhythm)  procarbazine  rasagiline  supplements like St. John's wort, kava kava, valerian  tramadol  tryptophan  ziprasidone This list may not describe all possible interactions. Give your health care provider a  list of all the medicines, herbs, non-prescription drugs, or dietary supplements you use. Also tell them if you smoke, drink alcohol, or use illegal drugs. Some items may interact  with your medicine. What should I watch for while using this medicine? Tell your doctor if your symptoms do not get better or if they get worse. Visit your doctor or health care professional for regular checks on your progress. Because it may take several weeks to see the full effects of this medicine, it is important to continue your treatment as prescribed by your doctor. Patients and their families should watch out for new or worsening thoughts of suicide or depression. Also watch out for sudden changes in feelings such as feeling anxious, agitated, panicky, irritable, hostile, aggressive, impulsive, severely restless, overly excited and hyperactive, or not being able to sleep. If this happens, especially at the beginning of treatment or after a change in dose, call your health care professional. Bonita Quin may get drowsy or dizzy. Do not drive, use machinery, or do anything that needs mental alertness until you know how this medicine affects you. Do not stand or sit up quickly, especially if you are an older patient. This reduces the risk of dizzy or fainting spells. Alcohol may interfere with the effect of this medicine. Avoid alcoholic drinks. Your mouth may get dry. Chewing sugarless gum or sucking hard candy, and drinking plenty of water may help. Contact your doctor if the problem does not go away or is severe. What side effects may I notice from receiving this medicine? Side effects that you should report to your doctor or health care professional as soon as possible:  allergic reactions like skin rash, itching or hives, swelling of the face, lips, or tongue  anxious  black, tarry stools  changes in vision  confusion  elevated mood, decreased need for sleep, racing thoughts, impulsive behavior  eye pain  fast, irregular heartbeat  feeling faint or lightheaded, falls  feeling agitated, angry, or irritable  hallucination, loss of contact with reality  loss of balance or  coordination  loss of memory  painful or prolonged erections  restlessness, pacing, inability to keep still  seizures  stiff muscles  suicidal thoughts or other mood changes  trouble sleeping  unusual bleeding or bruising  unusually weak or tired  vomiting Side effects that usually do not require medical attention (report to your doctor or health care professional if they continue or are bothersome):  changes in appetite  change in sex drive or performance  headache  increased sweating  indigestion, nausea  tremors This list may not describe all possible side effects. Call your doctor for medical advice about side effects. You may report side effects to FDA at 1-800-FDA-1088. Where should I keep my medicine? Keep out of reach of children. Store at room temperature between 15 and 30 degrees C (59 and 86 degrees F). Throw away any unused medicine after the expiration date. NOTE: This sheet is a summary. It may not cover all possible information. If you have questions about this medicine, talk to your doctor, pharmacist, or health care provider.  2020 [employer]/Gold Standard (2018-11-12 11:21:44)

## 2020-07-13 NOTE — Progress Notes (Signed)
Established Patient Office Visit  Subjective:  Patient ID: Claire [name], female    DOB: 07/20/1951  Age: 69 y.o. MRN: [id number]  CC: No chief complaint on file.   HPI Claire [name] presents with her daughter today. She has some concerns with ongoing stomach pain and constipation.  She is here with her daughter today who has some concerns with her mothers frequent antibiotic use, weight loss, mood, and anxiety about her health.   STOMACH PAIN Patient reports that her stomach is hurting her all the time. She is concerned for gall bladder issues. She endorses generalized pain in the abdomen with no nausea, radiating pain, fevers, or tenderness. She endorses not eating frequently and avoiding many foods for fear of affects on her health including kidney damage, diarrhea, constipation, and increased potassium. She endorses taking bentyl when she eats due to intestinal pain. She reports that this helps with her pain and cramping.   Her daughter is concerned for lack of healthy gastrointestinal flora due to frequent antibiotic use. She also endorses concern that her mother's stomach pain is highly psychosomatic and resulting from lack of food intake and increased anxiety related to her health. Her daughter reports that until recently she delivered food to her mother and most of the food purchased would have to be disposed of because it was not consumed.   CONSTIPATION Patient states that she normally has a bowel movement after every meal. On Sunday and at the time of the visit today she had not have a bowel movement. She is very concerned that this could be making her stomach pain worse and reports anxiety over not being able to have a bowel movement.   Her daughter reports concerns over her mothers bowel habits. She reports that her mother will take medication for diarrhea when she has a single lose stool. She also reports that she is overly concerned when there is any change in her bowel habits. She  is concerned that her mother is not taking in enough food to facilitate normal bowel movements.   MOOD The patient endorses that her mood is anxious and she has significant concerns over her health. She reports that she does avoid foods that she feels may make her more ill and endorses eating very little. She states that she does see psychiatry and is prescribed klonopin for her anxiety.   Her daughter reports concerns that her mothers mood is uncontrolled and the klonopin is not working to keep her mood stable. She reports that her mother has significant anxiety over her health and relies on prescriptions to help her not feel as anxious about her health conditions. She endorses concern over her mothers overall health and wellness.    Past Medical History:  Diagnosis Date  . Acid reflux   . Anxiety   . High blood pressure   . High serum parathyroid hormone (PTH) 07/19/2017   96 06/28/2014, 162 12/29/16  . Hypothyroid   . PTSD (post-traumatic stress disorder)     No past surgical history on file.  Family History  Problem Relation Age of Onset  . Anxiety disorder Mother   . OCD Daughter   . Alcohol abuse Father   . Anxiety disorder Sister     Social History   Socioeconomic History  . Marital status: Divorced    Spouse name: Not on file  . Number of children: 2  . Years of education: 7  . Highest education level: Associate degree: academic program  Occupational History  .  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 Smoking status: Current Some Day Smoker    Packs/day: 0.25    Years: 50.00    Pack years: 12.50    Types: Cigarettes  . Smokeless tobacco: Never Used  Vaping Use  . Vaping Use: Never used  Substance and Sexual Activity  . Alcohol use: No  . Drug use: No  . Sexual activity: Not Currently  Other Topics Concern  . Not on file  Social History Narrative  . Not on file   Social Determinants of Health   Financial Resource Strain:    . Difficulty of Paying Living Expenses:   Food Insecurity:   . Worried About Programme researcher, broadcasting/film/videounning Out of Food in the Last Year:   . Baristaan Out of Food in the Last Year:   Transportation Needs:   . Freight forwarderLack of Transportation (Medical):   [name]. Lack of Transportation (Non-Medical):   Physical Activity:   . Days of Exercise per Week:   . Minutes of Exercise per Session:   Stress:   . Feeling of Stress :   Social Connections:   . Frequency of Communication with Friends and Family:   . Frequency of Social Gatherings with Friends and Family:   . Attends Religious Services:   . Active Member of Clubs or Organizations:   . Attends BankerClub or Organization Meetings:   [name]. Marital Status:   Intimate Partner Violence:   . Fear of Current or Ex-Partner:   . Emotionally Abused:   [name]. Physically Abused:   . Sexually Abused:     Outpatient Medications Prior to Visit  Medication Sig Dispense Refill  . albuterol (PROAIR HFA) 108 (90 Base) MCG/ACT inhaler INHALE 1-2 PUFFS INTO THE LUNGS EVERY 4 (FOUR) HOURS AS NEEDED FOR WHEEZING OR SHORTNESS OF BREATH. 54 g 0  . albuterol (PROVENTIL) (2.5 MG/3ML) 0.083% nebulizer solution 1 VIAL IN NEBULIZER EVERY 4 HOURS AS NEEDED WHEEZING OR FOR SHORTNESS OF BREATH 75 mL 6  . AMBULATORY NON FORMULARY MEDICATION Continuous stationary and portable oxygen tanks for use with ambulation. DX: COPD with hypoxia. 4 Device 11  . AMBULATORY NON FORMULARY MEDICATION Home concentrator and one Imogen G3 portable oxygen concentrator for hypoxia. 1 Device 0  . AMBULATORY NON FORMULARY MEDICATION Portable oxygen compressor device for portable O2 administration continuous 2L/min while walking. Dx COPD w/ hypoxia. Titrate for OCD (oxygen conserving device). 1 Units 1  . AMBULATORY NON FORMULARY MEDICATION One pulse ox to manage SOB and O2 use. 360 g 5  . AMBULATORY NON FORMULARY MEDICATION Portable oxygen concentrator at 2L of continuous oxygen to use with ambulation for COPD and chronic hypoxemia. 1 Device 0   . atenolol (TENORMIN) 25 MG tablet Take 1 tablet (25 mg total) by mouth daily. 90 tablet 3  . BREZTRI AEROSPHERE 160-9-4.8 MCG/ACT AERO INHALE 2 PUFFS INTO THE LUNGS IN THE MORNING AND AT BEDTIME.    . cetirizine (ZYRTEC) 10 MG chewable tablet Chew 10 mg by mouth daily.    . cholecalciferol (VITAMIN D) 25 MCG (1000 UT) tablet Take 1,000 Units by mouth daily.    . ciprofloxacin (CIPRO) 500 MG tablet Take 1 tablet (500 mg total) by mouth 2 (two) times daily. 10 tablet 0  . clonazePAM (KLONOPIN) 0.5 MG tablet TAKE 1 TABLET BY MOUTH 3 TIMES A DAY 90 tablet 2  . dextromethorphan-guaiFENesin (MUCINEX DM) 30-600 MG 12hr tablet Take 1 tablet by mouth 2 (two) times daily. 30 tablet 0  .  dicyclomine (BENTYL) 10 MG capsule Take 1 capsule (10 mg total) by mouth 3 (three) times daily before meals. 90 capsule 2  . estradiol (ESTRACE VAGINAL) 0.1 MG/GM vaginal cream Place 1 Applicatorful vaginally 3 (three) times a week. And one pea-size amt applied to urethral opening. 42.5 g 1  . famotidine (PEPCID) 20 MG tablet TAKE 1 TABLET BY MOUTH TWICE A DAY 180 tablet 1  . ferrous sulfate 325 (65 FE) MG EC tablet 1 tab PO TID every other day 90 tablet 11  . fexofenadine (ALLEGRA ALLERGY) 60 MG tablet Take 1 tablet (60 mg total) by mouth 2 (two) times daily. 60 tablet 1  . fluticasone (FLONASE) 50 MCG/ACT nasal spray Place 2 sprays into both nostrils daily. 16 g 6  . levothyroxine (SYNTHROID) 75 MCG tablet Take 1 tablet (75 mcg total) by mouth daily. 90 tablet 3  . lidocaine (LIDODERM) 5 % Place 1 patch onto the skin daily. Remove & Discard patch within 12 hours or as directed by MD 30 patch 0  . Magnesium 250 MG TABS Take 250 mg by mouth. Take one tablet daily    . mometasone (NASONEX) 50 MCG/ACT nasal spray One spray in each nostril twice a day, use left hand for right nostril, and right hand for left nostril.  Please dispense one bottle. 1 g 2  . mometasone-formoterol (DULERA) 100-5 MCG/ACT AERO Inhale 2 puffs into the  lungs in the morning and at bedtime. 13 g 1  . mometasone-formoterol (DULERA) 100-5 MCG/ACT AERO TAKE 2 PUFFS BY MOUTH TWICE A DAY 13 g 6  . predniSONE (DELTASONE) 20 MG tablet Take 2 tablets (40 mg total) by mouth daily with breakfast. 10 tablet 0   No facility-administered medications prior to visit.    Allergies  Allergen Reactions  . Bactrim [Sulfamethoxazole-Trimethoprim]     ACUTE RENAL fAILURE/HYPONATREMIA/HYPERKALEMIA  . Macrodantin [Nitrofurantoin Macrocrystal] Rash  . Trimethoprim     Fatigue/weakness/no appetite  . Zithromax [Azithromycin]     diarrhea      Objective:    Physical Exam Vitals and nursing note reviewed.  Constitutional:      Appearance: Normal appearance. She is ill-appearing.  HENT:     Head: Normocephalic and atraumatic.     Mouth/Throat:     Mouth: Mucous membranes are moist.     Pharynx: Oropharynx is clear.  Eyes:     Extraocular Movements: Extraocular movements intact.     Conjunctiva/sclera: Conjunctivae normal.     Pupils: Pupils are equal, round, and reactive to light.  Neck:     Vascular: No carotid bruit.  Cardiovascular:     Rate and Rhythm: Normal rate and regular rhythm.     Pulses: Normal pulses.     Heart sounds: Normal heart sounds.  Pulmonary:     Effort: Tachypnea present.     Breath sounds: Examination of the right-lower field reveals decreased breath sounds and rhonchi. Examination of the left-lower field reveals decreased breath sounds and rhonchi. Decreased breath sounds and rhonchi present. No wheezing.  Abdominal:     General: Abdomen is flat. Bowel sounds are normal. There is no distension or abdominal bruit. There are no signs of injury.     Palpations: Abdomen is soft. There is no shifting dullness, fluid wave, hepatomegaly, splenomegaly, mass or pulsatile mass.     Tenderness: There is abdominal tenderness in the epigastric area. There is no right CVA tenderness, left CVA tenderness, guarding or rebound. Negative  signs include Murphy's sign, Rovsing's sign  and McBurney's sign.     Hernia: No hernia is present.  Musculoskeletal:        General: Normal range of motion.     Cervical back: Normal range of motion and neck supple. No tenderness.  Lymphadenopathy:     Cervical: No cervical adenopathy.  Skin:    General: Skin is warm and dry.     Capillary Refill: Capillary refill takes less than 2 seconds.     Coloration: Skin is pale.  Neurological:     General: No focal deficit present.     Mental Status: She is alert and oriented to person, place, and time.     Motor: Weakness present.     Gait: Gait abnormal.     There were no vitals taken for this visit. Wt Readings from Last 3 Encounters:  06/15/20 105 lb (47.6 kg)  05/29/20 105 lb (47.6 kg)  05/12/20 105 lb (47.6 kg)     Health Maintenance Due  Topic Date Due  . INFLUENZA VACCINE  07/05/2020    There are no preventive care reminders to display for this patient.  Lab Results  Component Value Date   TSH 1.530 03/03/2020   Lab Results  Component Value Date   WBC 6.6 05/27/2020   HGB 9.6 (L) 05/27/2020   HCT 28.4 (L) 05/27/2020   MCV 98 (H) 05/27/2020   PLT 291 05/27/2020   Lab Results  Component Value Date   NA 137 05/27/2020   K 4.0 05/27/2020   CO2 29 05/27/2020   GLUCOSE 89 05/27/2020   BUN 14 05/27/2020   CREATININE 1.02 (H) 05/27/2020   BILITOT <0.2 05/27/2020   ALKPHOS 62 05/27/2020   AST 15 05/27/2020   ALT 6 05/27/2020   PROT 7.0 05/27/2020   ALBUMIN 4.4 05/27/2020   CALCIUM 9.7 05/27/2020   Lab Results  Component Value Date   CHOL 174 03/03/2020   Lab Results  Component Value Date   HDL 55 03/03/2020   Lab Results  Component Value Date   LDLCALC 105 (H) 03/03/2020   Lab Results  Component Value Date   TRIG 75 03/03/2020   Lab Results  Component Value Date   CHOLHDL 3.2 03/03/2020   No results found for: HGBA1C    Assessment & Plan:  1. Anxiety about health Discussion with both the  patient and her daughter simultaneously about their concerns. It is evident there is a significant amount of anxiety about her health from the patient. The presentation of generalized abdominal pain could most likely be a manifestation from her ongoing anxiety and fear of failing health. A review of the health record also indicates significant weight loss.  Discussed at length the importance of regulation of mood and its benefits on overall health. Also discussed the option of starting a daily medication in addition to as needed klonopin prescribed by psychiatry. Patient and daughter were open to this idea and suggested Lexapro, which was tolerated well in the past.  Reassured patient that today I do not see any signs of gallbladder dysfunction or concerns for worsening COPD.  Will trial Lexapro 20mg  and follow-up in 4 weeks on anxiety.  - escitalopram (LEXAPRO) 20 MG tablet; One half tab daily for a week then one tab by mouth daily  Dispense: 30 tablet; Refill: 1  2. Neurosis, anxiety, panic type Anxiety and frequent worry about health to the point that it appears patient is making unhealthy choices in an effort to avoid creating other problems (ie: cuting  out all foods with potassium for fear of increasing her potassium levels). I certainly do think that her anxiety is interfering with her ability to maintain health and life a healthier lifestyle. I do feel that a daily medication for anxiety would be of great benefit to the patient and may help with some of the somatic symptoms she has been experiencing, including generalized abdominal pain.  Patient reports she has tolerated lexapro in the past and is willing to try this again.  Plan to taper lexapro to 20mg  and follow-up in 4 weeks.  - escitalopram (LEXAPRO) 20 MG tablet; One half tab daily for a week then one tab by mouth daily  Dispense: 30 tablet; Refill: 1  3. Malnutrition of mild degree (HCC) 4. Body mass index (BMI) of 19 or less in  adult Patient is down to 100 pounds at her visit today. Her anxiety appears to be interfering with her ability to eat a successful and healthy diet. We have tried nutrition supplements in the past to try to boost caloric intake, but patient reports she was unable to tolerate these.  The patients overall health is deteriorating and I do feel that her limited nutrition and weight loss are contributing to these. Chart review reveals a significant antibiotic use in the recent past for frequent exacerbations of COPD. Discussed at length the importance of keeping her nutrition and calorie count up in order to improve her overall health. Discussed the significance malnutrition has on her chronic disease states and the negative impact this is making on her ability to fight infection.  Recommend 3 well balanced meals every day with high protein AND the addition of high protein supplements such as Boost or Ensure to increase her caloric intake. Discussed these options with both the patient and her daughter and stressed the importance of proper nutrition on her ability to continue to live.   5. Chronic respiratory failure with hypoxia (HCC) COPD with recent exacerbation requiring antibiotic therapy and steroids. Her lungs sound pretty good today and her oxygen is at 100%. She is oxygen dependent at this time, but overall seems to be recovering from her recent exacerbation.  I do have significant concerns with her overall health and her COPD if we are unable to get better control of her weight loss and her mood.  Discussed at length the progression of COPD and the increased infection rates as the disease progresses related to incomplete clearance of airways and increased mucous production. I also discussed the influence that diet has on the management of chronic disease and the progression.  Continue oxygen supplementation and inhaled medications for management.  Work on improving caloric intake.   6. Drug-induced  constipation Symptoms and presentation consistent with constipation, most likely from iron supplementation due to anemia in correlation with IBS.  It is very likely that frequent antibiotic uses have also altered the normal intestinal flora, although we would normally expect to see diarrhea, this could add to the problem, as well.  Discussed adding colace 1-3 times per day for constipation until stools are regular then taking 1 per day for maintenance. Stressed the importance of not using antidiarrheal medications if her stools become soft, but reduce the amount of colace taken to avoid stools that are too soft.  Also discussed adding probiotic regimen to her daily medication to replenish the normal intestinal flora to help with symptoms.   7. Generalized abdominal pain Abdominal pain without physiological cause, most likely related to increased anxiety and poor oral  intake. Discussed this with patient and she was agreeable that some of her pain is likely related to poor intake and could be related to hunger.  Stressed the importance of a healthy, regular diet as listed above.  Continue bentyl as needed for cramping, Colace for softer stool, probiotic for intestinal flora and will start lexapro for anxiety.  Plan to follow-up in 4 weeks.  Ongoing discussions will be needed to help reiterate the items discussed today and evaluate for effectiveness of current treatment.   Plan to follow-up in 4 weeks or sooner for Mood and Weight check   Greater than 50% of this visit was spent providing education and discussion about the patients current health conditions and treatment options.   Tollie Eth, NP

## 2020-07-14 ENCOUNTER — Other Ambulatory Visit: Payer: Self-pay | Admitting: Nurse Practitioner

## 2020-07-14 DIAGNOSIS — F410 Panic disorder [episodic paroxysmal anxiety] without agoraphobia: Secondary | ICD-10-CM

## 2020-07-14 DIAGNOSIS — F418 Other specified anxiety disorders: Secondary | ICD-10-CM

## 2020-07-24 ENCOUNTER — Other Ambulatory Visit: Payer: Self-pay | Admitting: Physician Assistant

## 2020-07-24 ENCOUNTER — Telehealth (HOSPITAL_COMMUNITY): Payer: Self-pay

## 2020-07-24 MED ORDER — CLONAZEPAM 0.5 MG PO TABS: ORAL_TABLET | ORAL | 0 refills | Status: DC

## 2020-07-24 NOTE — Telephone Encounter (Signed)
sent 

## 2020-07-24 NOTE — Telephone Encounter (Signed)
Patient needs a refill on clonazepam sent to [facility] pharmacy.

## 2020-07-27 ENCOUNTER — Other Ambulatory Visit: Payer: Self-pay

## 2020-07-27 ENCOUNTER — Encounter: Payer: Self-pay | Admitting: Physician Assistant

## 2020-07-27 ENCOUNTER — Ambulatory Visit (INDEPENDENT_AMBULATORY_CARE_PROVIDER_SITE_OTHER): Payer: Medicare Other | Admitting: Physician Assistant

## 2020-07-27 VITALS — BP 114/66 | HR 86 | Temp 98.2°F | Wt 101.0 lb

## 2020-07-27 DIAGNOSIS — N3090 Cystitis, unspecified without hematuria: Secondary | ICD-10-CM | POA: Diagnosis not present

## 2020-07-27 DIAGNOSIS — J9611 Chronic respiratory failure with hypoxia: Secondary | ICD-10-CM

## 2020-07-27 DIAGNOSIS — J432 Centrilobular emphysema: Secondary | ICD-10-CM | POA: Diagnosis not present

## 2020-07-27 DIAGNOSIS — R351 Nocturia: Secondary | ICD-10-CM

## 2020-07-27 DIAGNOSIS — Z7185 Encounter for immunization safety counseling: Secondary | ICD-10-CM

## 2020-07-27 DIAGNOSIS — Z7189 Other specified counseling: Secondary | ICD-10-CM

## 2020-07-27 DIAGNOSIS — R0600 Dyspnea, unspecified: Secondary | ICD-10-CM

## 2020-07-27 DIAGNOSIS — R600 Localized edema: Secondary | ICD-10-CM

## 2020-07-27 LAB — POCT URINALYSIS DIP (CLINITEK)
Bilirubin, UA: NEGATIVE
Glucose, UA: NEGATIVE mg/dL
Ketones, POC UA: NEGATIVE mg/dL
Nitrite, UA: POSITIVE — AB
POC PROTEIN,UA: 30 — AB
Spec Grav, UA: 1.015 (ref 1.010–1.025)
Urobilinogen, UA: 0.2 E.U./dL
pH, UA: 6.5 (ref 5.0–8.0)

## 2020-07-27 MED ORDER — AMOXICILLIN-POT CLAVULANATE 500-125 MG PO TABS: 1.0000 | ORAL_TABLET | Freq: Two times a day (BID) | ORAL | 0 refills | Status: DC

## 2020-07-27 NOTE — Patient Instructions (Signed)
Urinary Tract Infection, Adult A urinary tract infection (UTI) is an infection of any part of the urinary tract. The urinary tract includes:  The kidneys.  The ureters.  The bladder.  The urethra. These organs make, store, and get rid of pee (urine) in the body. What are the causes? This is caused by germs (bacteria) in your genital area. These germs grow and cause swelling (inflammation) of your urinary tract. What increases the risk? You are more likely to develop this condition if:  You have a small, thin tube (catheter) to drain pee.  You cannot control when you pee or poop (incontinence).  You are female, and: ? You use these methods to prevent pregnancy:  A medicine that kills sperm (spermicide).  A device that blocks sperm (diaphragm). ? You have low levels of a female hormone (estrogen). ? You are pregnant.  You have genes that add to your risk.  You are sexually active.  You take antibiotic medicines.  You have trouble peeing because of: ? A prostate that is bigger than normal, if you are female. ? A blockage in the part of your body that drains pee from the bladder (urethra). ? A kidney stone. ? A nerve condition that affects your bladder (neurogenic bladder). ? Not getting enough to drink. ? Not peeing often enough.  You have other conditions, such as: ? Diabetes. ? A weak disease-fighting system (immune system). ? Sickle cell disease. ? Gout. ? Injury of the spine. What are the signs or symptoms? Symptoms of this condition include:  Needing to pee right away (urgently).  Peeing often.  Peeing small amounts often.  Pain or burning when peeing.  Blood in the pee.  Pee that smells bad or not like normal.  Trouble peeing.  Pee that is cloudy.  Fluid coming from the vagina, if you are female.  Pain in the belly or lower back. Other symptoms include:  Throwing up (vomiting).  No urge to eat.  Feeling mixed up (confused).  Being tired  and grouchy (irritable).  A fever.  Watery poop (diarrhea). How is this treated? This condition may be treated with:  Antibiotic medicine.  Other medicines.  Drinking enough water. Follow these instructions at home:  Medicines  Take over-the-counter and prescription medicines only as told by your doctor.  If you were prescribed an antibiotic medicine, take it as told by your doctor. Do not stop taking it even if you start to feel better. General instructions  Make sure you: ? Pee until your bladder is empty. ? Do not hold pee for a long time. ? Empty your bladder after sex. ? Wipe from front to back after pooping if you are a female. Use each tissue one time when you wipe.  Drink enough fluid to keep your pee pale yellow.  Keep all follow-up visits as told by your doctor. This is important. Contact a doctor if:  You do not get better after 1-2 days.  Your symptoms go away and then come back. Get help right away if:  You have very bad back pain.  You have very bad pain in your lower belly.  You have a fever.  You are sick to your stomach (nauseous).  You are throwing up. Summary  A urinary tract infection (UTI) is an infection of any part of the urinary tract.  This condition is caused by germs in your genital area.  There are many risk factors for a UTI. These include having a small, thin   tube to drain pee and not being able to control when you pee or poop.  Treatment includes antibiotic medicines for germs.  Drink enough fluid to keep your pee pale yellow. This information is not intended to replace advice given to you by your health care provider. Make sure you discuss any questions you have with your health care provider. Document Revised: 11/08/2018 Document Reviewed: 05/31/2018 [employer]vier Patient Education  2020 [employer]  

## 2020-07-27 NOTE — Progress Notes (Signed)
Subjective:    Patient ID: Claire [name], female    DOB: [birth year], 69 y.o.   MRN: [id number]  HPI  Patient is a 69 year old female with chronic respiratory failure with hypoxia, recurrent UTIs, stage IIIa CKD, anemia of chronic disease, anxiety about health who presents to the clinic with concerns of frequent urination.  Her last infection was 05/12/2020.  She continues to use estrogen cream that had previously prevented frequent exacerbations.  She is waking up multiple times a night to urinate and during the day.  She denies any dysuria.  She does have a urine odor.  Patient is also noticed some bilateral intermittent swelling of ankles and feet.  Hard to tell because she continually has shortness of breath but she is worried about her heart.  COPD-on continuous oxygen.  Using daily Dulera.  Using frequent nebulizer treatments.  Continues to smoke.  .. Active Ambulatory Problems    Diagnosis Date Noted  . PTSD (post-traumatic stress disorder) 11/10/2011  . Neurosis, anxiety, panic type 11/10/2011  . Acquired hypothyroidism 11/24/2014  . BP (high blood pressure) 07/24/2012  . Chronic use of benzodiazepine for therapeutic purpose 08/11/2015  . Hyponatremia 12/19/2016  . Hyperkalemia 12/19/2016  . Centrilobular emphysema (HCC) 12/20/2016  . Stage 3a chronic kidney disease 01/16/2017  . Iron deficiency anemia 01/16/2017  . Anemia of chronic disease 05/11/2017  . Low serum vitamin B12 05/14/2017  . Vitamin D deficiency 05/14/2017  . Depressed mood 05/14/2017  . High serum parathyroid hormone (PTH) 07/19/2017  . Normocytic anemia 07/19/2017  . DOE (dyspnea on exertion) 05/09/2018  . Recurrent UTI 05/07/2019  . Leg cramps 05/29/2019  . No energy 05/29/2019  . History of hypokalemia 10/09/2019  . Vaccine counseling 12/24/2019  . Menopausal vaginal dryness 12/24/2019  . Chronic rhinitis 01/31/2020  . Chronic respiratory failure with hypoxia (HCC) 01/31/2020  . Weight loss 02/19/2020   . Macrocytic anemia 03/10/2020  . Elevated ferritin 03/10/2020  . Irritable bowel syndrome with diarrhea 03/10/2020  . Anxiety about health 03/23/2020  . Pernicious anemia 03/23/2020  . Malnutrition of mild degree (HCC) 05/06/2020  . Body mass index (BMI) of 19 or less in adult 05/08/2020  . Drug-induced constipation 07/13/2020  . Lower extremity edema 07/29/2020   Resolved Ambulatory Problems    Diagnosis Date Noted  . Hypothyroid   . Acute renal failure (HCC) 12/19/2016  . Decreased hemoglobin 12/20/2016  . Acute kidney injury (HCC) 03/17/2017  . Sinobronchitis 06/16/2020   Past Medical History:  Diagnosis Date  . Acid reflux   . Anxiety   . High blood pressure                                                                                                                            Review of Systems See HPI.     Objective:   Physical Exam Vitals reviewed.  Constitutional:      Comments: On continuous O2.  Frail appearance.  HENT:     Head: Normocephalic.  Cardiovascular:     Rate and Rhythm: Normal rate and regular rhythm.  Pulmonary:     Effort: Pulmonary effort is normal.     Comments: Coarse breath sounds  Abdominal:     General: There is no distension.     Tenderness: There is no abdominal tenderness. There is no right CVA tenderness or left CVA tenderness.  Musculoskeletal:     Cervical back: Normal range of motion.     Comments: Bilateral ankles scant non-pitting edema.   Neurological:     General: No focal deficit present.     Mental Status: She is alert and oriented to person, place, and time.           Assessment & Plan:  [name] KitchenMarland KitchenCyndy was seen today for edema and nocturia.  Diagnoses and all orders for this visit:  Cystitis -     POCT URINALYSIS DIP (CLINITEK) -     Basic metabolic panel -     amoxicillin-clavulanate (AUGMENTIN) 500-125 MG tablet; Take 1 tablet (500 mg total) by mouth in the morning and at bedtime. For 7 days. -      Urinalysis, Routine w reflex microscopic -     Urine Culture -     MICROSCOPIC MESSAGE  Nocturia more than twice per night -     POCT URINALYSIS DIP (CLINITEK) -     Urinalysis, Routine w reflex microscopic -     Urine Culture  Centrilobular emphysema (HCC) -     Basic metabolic panel  Chronic respiratory failure with hypoxia (HCC)  Lower extremity edema -     Basic metabolic panel -     ECHOCARDIOGRAM COMPLETE; Future  Vaccine counseling  Dyspnea, unspecified type -     ECHOCARDIOGRAM COMPLETE; Future   Results for orders placed or performed in visit on 07/27/20  Urinalysis, Routine w reflex microscopic  Result Value Ref Range   Color, Urine YELLOW YELLOW   APPearance TURBID (A) CLEAR   Specific Gravity, Urine 1.010 1.001 - 1.03   pH 6.0 5.0 - 8.0   Glucose, UA NEGATIVE NEGATIVE   Bilirubin Urine NEGATIVE NEGATIVE   Ketones, ur NEGATIVE NEGATIVE   Hgb urine dipstick 1+ (A) NEGATIVE   Protein, ur 1+ (A) NEGATIVE   Nitrite POSITIVE (A) NEGATIVE   Leukocytes,Ua 3+ (A) NEGATIVE   WBC, UA PACKED (A) 0 - 5 /HPF   RBC / HPF 3-10 (A) 0 - 2 /HPF   Squamous Epithelial / LPF NONE SEEN < OR = 5 /HPF   Bacteria, UA MANY (A) NONE SEEN /HPF   Hyaline Cast NONE SEEN NONE SEEN /LPF  POCT URINALYSIS DIP (CLINITEK)  Result Value Ref Range   Color, UA light yellow (A) yellow   Clarity, UA turbid (A) clear   Glucose, UA negative negative mg/dL   Bilirubin, UA negative negative   Ketones, POC UA negative negative mg/dL   Spec Grav, UA 8.315 1.761 - 1.025   Blood, UA moderate (A) negative   pH, UA 6.5 5.0 - 8.0   POC PROTEIN,UA =30 (A) negative, trace   Urobilinogen, UA 0.2 0.2 or 1.0 E.U./dL   Nitrite, UA Positive (A) Negative   Leukocytes, UA Large (3+) (A) Negative   Treated for UTI with augmentin.  Will culture.  Repeat urine in 2 weeks for confirmed treatment.   Pt does not have covid vaccine but she states a nurse is scheduled to come out in the next few  weeks. Reassured  her that ok to be on antibiotic.   Very minimal edema ongoing shortness of breath will get echo. She has never had echo done. Encouraged compression stockings and keeping feet elevated.    Spent 30 minutes with patient.

## 2020-07-29 ENCOUNTER — Encounter: Payer: Self-pay | Admitting: Physician Assistant

## 2020-07-29 ENCOUNTER — Telehealth: Payer: Self-pay

## 2020-07-29 DIAGNOSIS — R600 Localized edema: Secondary | ICD-10-CM | POA: Insufficient documentation

## 2020-07-29 NOTE — Telephone Encounter (Signed)
Ordered it may take them a day or so to call you from [facility].

## 2020-07-29 NOTE — Telephone Encounter (Signed)
Pt called stating provider was going to order an echo but no order has been entered. Pls advise, thanks.

## 2020-07-30 ENCOUNTER — Ambulatory Visit (INDEPENDENT_AMBULATORY_CARE_PROVIDER_SITE_OTHER): Payer: Medicare Other | Admitting: Family Medicine

## 2020-07-30 ENCOUNTER — Other Ambulatory Visit: Payer: Self-pay | Admitting: Physician Assistant

## 2020-07-30 VITALS — BP 122/77 | HR 98

## 2020-07-30 DIAGNOSIS — N390 Urinary tract infection, site not specified: Secondary | ICD-10-CM | POA: Diagnosis not present

## 2020-07-30 LAB — URINALYSIS, ROUTINE W REFLEX MICROSCOPIC
Bilirubin Urine: NEGATIVE
Glucose, UA: NEGATIVE
Hyaline Cast: NONE SEEN /LPF
Ketones, ur: NEGATIVE
Nitrite: POSITIVE — AB
Specific Gravity, Urine: 1.01 (ref 1.001–1.03)
Squamous Epithelial / HPF: NONE SEEN /HPF (ref <=5)
pH: 6 (ref 5.0–8.0)

## 2020-07-30 LAB — URINE CULTURE
MICRO NUMBER:: 10859695
SPECIMEN QUALITY:: ADEQUATE

## 2020-07-30 MED ORDER — CEPHALEXIN 500 MG PO CAPS
500.0000 mg | ORAL_CAPSULE | Freq: Two times a day (BID) | ORAL | 0 refills | Status: DC
Start: 2020-07-30 — End: 2020-08-14

## 2020-07-30 MED ORDER — CEFTRIAXONE SODIUM 1 G IJ SOLR
1.0000 g | Freq: Once | INTRAMUSCULAR | Status: AC
  Administered 2020-07-30: 1 g via INTRAMUSCULAR

## 2020-07-30 MED ORDER — CEFTRIAXONE SODIUM 1 G IJ SOLR: 1.0000 g | Freq: Once | INTRAMUSCULAR | 0 refills | Status: DC

## 2020-07-30 NOTE — Progress Notes (Signed)
Patient presented in office for Rocephin injection 1 gram. Patient tolerated injection well. Patient advised to wait in clinic 10 minutes after injection.

## 2020-07-30 NOTE — Progress Notes (Signed)
    Jomarie Longs, PA-C  07/30/2020 9:12 AM EDT     Your urine culture came back and augmentin will not treat this infection. It is actually resistant to a lot of different antibiotics. We need you to come into office to get shot of rocephin 1g and then I am going to send keflex to pharmacy to start. Stop augmentin. In 7-10 days need to recheck urine culture.    UTI-agree with note above.

## 2020-07-30 NOTE — Progress Notes (Signed)
Your urine culture came back and augmentin will not treat this infection. It is actually resistant to a lot of different antibiotics. We need you to come into office to get shot of rocephin 1g and then I am going to send keflex to pharmacy to start. Stop augmentin. In 7-10 days need to recheck urine culture.

## 2020-07-31 NOTE — Telephone Encounter (Signed)
Patient is aware 

## 2020-07-31 NOTE — Addendum Note (Signed)
Addended by: Jed Limerick on: 07/31/2020 09:22 AM   Modules accepted: Orders

## 2020-08-05 ENCOUNTER — Telehealth: Payer: Self-pay

## 2020-08-05 DIAGNOSIS — R600 Localized edema: Secondary | ICD-10-CM

## 2020-08-05 DIAGNOSIS — R0602 Shortness of breath: Secondary | ICD-10-CM

## 2020-08-05 NOTE — Telephone Encounter (Signed)
Pt called stating she was given a referral to complete an echo. Pt is requesting to to have the echo done at Dr. Alisa Graff office located in [place]. Previous order went to [facility].

## 2020-08-05 NOTE — Telephone Encounter (Signed)
Arline Asp, can you check on this please

## 2020-08-06 ENCOUNTER — Other Ambulatory Visit (INDEPENDENT_AMBULATORY_CARE_PROVIDER_SITE_OTHER): Payer: Medicare Other | Admitting: Physician Assistant

## 2020-08-06 ENCOUNTER — Encounter: Payer: Self-pay | Admitting: Physician Assistant

## 2020-08-06 DIAGNOSIS — N390 Urinary tract infection, site not specified: Secondary | ICD-10-CM | POA: Diagnosis not present

## 2020-08-06 LAB — POCT URINALYSIS DIP (CLINITEK)
Bilirubin, UA: NEGATIVE
Blood, UA: NEGATIVE
Glucose, UA: NEGATIVE mg/dL
Ketones, POC UA: NEGATIVE mg/dL
Nitrite, UA: NEGATIVE
POC PROTEIN,UA: NEGATIVE
Spec Grav, UA: 1.015 (ref 1.010–1.025)
Urobilinogen, UA: 0.2 E.U./dL
pH, UA: 7 (ref 5.0–8.0)

## 2020-08-06 NOTE — Progress Notes (Signed)
Urine has improved. No blood or nitrates. Still some leukocytes which could be normal but will culture to confirm bacteria is gone.

## 2020-08-06 NOTE — Telephone Encounter (Signed)
Referral placed to Dr. Leeann Must.

## 2020-08-06 NOTE — Telephone Encounter (Signed)
Ok for referral find out where she wants to go reason SOB on exertion/bilateral lower leg edema.

## 2020-08-06 NOTE — Progress Notes (Signed)
"  In 7-10 days need to recheck urine culture"

## 2020-08-08 LAB — URINE CULTURE
MICRO NUMBER:: 10908589
Result:: NO GROWTH
SPECIMEN QUALITY:: ADEQUATE

## 2020-08-11 NOTE — Progress Notes (Signed)
Urine culture showed no growth of bacteria. Urinary tract infection is cleared. No need for any antibiotics.

## 2020-08-14 ENCOUNTER — Telehealth (INDEPENDENT_AMBULATORY_CARE_PROVIDER_SITE_OTHER): Payer: Medicare Other | Admitting: Physician Assistant

## 2020-08-14 ENCOUNTER — Encounter: Payer: Self-pay | Admitting: Physician Assistant

## 2020-08-14 ENCOUNTER — Other Ambulatory Visit: Payer: Self-pay | Admitting: Physician Assistant

## 2020-08-14 VITALS — Ht 63.0 in | Wt 101.0 lb

## 2020-08-14 DIAGNOSIS — F418 Other specified anxiety disorders: Secondary | ICD-10-CM | POA: Diagnosis not present

## 2020-08-14 DIAGNOSIS — Z7185 Encounter for immunization safety counseling: Secondary | ICD-10-CM

## 2020-08-14 DIAGNOSIS — N390 Urinary tract infection, site not specified: Secondary | ICD-10-CM | POA: Diagnosis not present

## 2020-08-14 DIAGNOSIS — Z7189 Other specified counseling: Secondary | ICD-10-CM | POA: Diagnosis not present

## 2020-08-14 MED ORDER — ESCITALOPRAM OXALATE 10 MG PO TABS: ORAL_TABLET | ORAL | 2 refills | Status: DC

## 2020-08-14 NOTE — Progress Notes (Signed)
Patient ID: Claire [name], female   DOB: Apr 08, 1951, 69 y.o.   MRN: [id number] .[name] KitchenVirtual Visit via Telephone Note  I connected with LANNA LABELLA on 08/14/20 at 10:50 AM EDT by telephone and verified that I am speaking with the correct person using two identifiers.  Location: Patient: home Provider: clinic   I discussed the limitations, risks, security and privacy concerns of performing an evaluation and management service by telephone and the availability of in person appointments. I also discussed with the patient that there may be a patient responsible charge related to this service. The patient expressed understanding and agreed to proceed.   History of Present Illness:  Patient is a 69 year old female with COPD, chronic respiratory failure with hypoxia, recurrent UTIs who is very anxious about her health today.  Her last UTI was 8/23 and treated with Keflex.  After completion of antibiotic her urine culture was sent off and negative for any bacteria growth.  She does feel like she is better with her symptoms.  She always worries once he gets to the weekend that she is going to have a urinary tract infection and not be able to get treatment.  She denies any fever, chills, flank pain, abdominal pain, dysuria.  She always knows when she is getting urinary tract infection because she starts urinating more.  She has been using Premarin twice a week for prevention.  He has done really well and decreased her urinary tract infections.  Patient is very concerned about getting Covid but she is very concerned about getting the Covid vaccine.  She does not know what to do.  She would like to discuss this again.   .. Active Ambulatory Problems    Diagnosis Date Noted  . PTSD (post-traumatic stress disorder) 11/10/2011  . Neurosis, anxiety, panic type 11/10/2011  . Acquired hypothyroidism 11/24/2014  . BP (high blood pressure) 07/24/2012  . Chronic use of benzodiazepine for therapeutic purpose  08/11/2015  . Hyponatremia 12/19/2016  . Hyperkalemia 12/19/2016  . Centrilobular emphysema (HCC) 12/20/2016  . Stage 3a chronic kidney disease 01/16/2017  . Iron deficiency anemia 01/16/2017  . Anemia of chronic disease 05/11/2017  . Low serum vitamin B12 05/14/2017  . Vitamin D deficiency 05/14/2017  . Depressed mood 05/14/2017  . High serum parathyroid hormone (PTH) 07/19/2017  . Normocytic anemia 07/19/2017  . DOE (dyspnea on exertion) 05/09/2018  . Recurrent UTI 05/07/2019  . Leg cramps 05/29/2019  . No energy 05/29/2019  . History of hypokalemia 10/09/2019  . Vaccine counseling 12/24/2019  . Menopausal vaginal dryness 12/24/2019  . Chronic rhinitis 01/31/2020  . Chronic respiratory failure with hypoxia (HCC) 01/31/2020  . Weight loss 02/19/2020  . Macrocytic anemia 03/10/2020  . Elevated ferritin 03/10/2020  . Irritable bowel syndrome with diarrhea 03/10/2020  . Anxiety about health 03/23/2020  . Pernicious anemia 03/23/2020  . Malnutrition of mild degree (HCC) 05/06/2020  . Body mass index (BMI) of 19 or less in adult 05/08/2020  . Drug-induced constipation 07/13/2020  . Lower extremity edema 07/29/2020   Resolved Ambulatory Problems    Diagnosis Date Noted  . Hypothyroid   . Acute renal failure (HCC) 12/19/2016  . Decreased hemoglobin 12/20/2016  . Acute kidney injury (HCC) 03/17/2017  . Sinobronchitis 06/16/2020   Past Medical History:  Diagnosis Date  . Acid reflux   . Anxiety   . High blood pressure    Reviewed med, allergy, problem list.   Observations/Objective: No acute distress Anxious and worried about UTI/damage to  kidneys/Covid/covid vaccine.   .. Today's Vitals   08/14/20 1021  Weight: 101 lb (45.8 kg)  Height: 5\' 3"  (1.6 m)   Body mass index is 17.89 kg/m.    Assessment and Plan: [name] KitchenAddaleigh was seen today for follow-up.  Diagnoses and all orders for this visit:  Recurrent UTI  Vaccine counseling  Anxiety about health -      escitalopram (LEXAPRO) 10 MG tablet; Take 1/2 tablet for 7 days then increase to one tablet daily.     Increa Premarin had help prevent UTIs from occurring.  Increase to 3 times a week over urethra.  Discussed she is actually only had 2 UTIs this year.  That is doing very well.  Reassurance given.  Discussed vaccine in detail.  Discussed how there is risk with getting the vaccine and risk with getting Covid.  She has to determine which risk she is willing to assume.  Strongly encouraged Covid vaccine.  Discussed anxiety with patient.  I definitely think she has a lot of anxiety that is causing her to obsess about urinary tract infections and other illnesses.  She never picked up Lexapro because she lost the prescription.  Prescribed Lexapro.  Strongly encouraged for her to try this.  She is aware it can take 4 to 6 weeks to fully get to therapeutic levels.   Follow Up Instructions:    I discussed the assessment and treatment plan with the patient. The patient was provided an opportunity to ask questions and all were answered. The patient agreed with the plan and demonstrated an understanding of the instructions.   The patient was advised to call back or seek an in-person evaluation if the symptoms worsen or if the condition fails to improve as anticipated.  I provided 20 minutes of non-face-to-face time during this encounter.   Rinaldo Cloud, PA-C

## 2020-08-21 ENCOUNTER — Ambulatory Visit (INDEPENDENT_AMBULATORY_CARE_PROVIDER_SITE_OTHER): Payer: Medicare Other | Admitting: Medical-Surgical

## 2020-08-21 ENCOUNTER — Telehealth: Payer: Self-pay | Admitting: Physician Assistant

## 2020-08-21 ENCOUNTER — Encounter: Payer: Self-pay | Admitting: Medical-Surgical

## 2020-08-21 VITALS — BP 133/87 | HR 107 | Temp 98.0°F | Ht 63.0 in | Wt 100.3 lb

## 2020-08-21 DIAGNOSIS — R300 Dysuria: Secondary | ICD-10-CM | POA: Diagnosis not present

## 2020-08-21 LAB — POCT URINALYSIS DIP (CLINITEK)
Bilirubin, UA: NEGATIVE
Blood, UA: NEGATIVE
Glucose, UA: NEGATIVE mg/dL
Ketones, POC UA: NEGATIVE mg/dL
Nitrite, UA: NEGATIVE
POC PROTEIN,UA: NEGATIVE
Spec Grav, UA: 1.01 (ref 1.010–1.025)
Urobilinogen, UA: 0.2 E.U./dL
pH, UA: 6.5 (ref 5.0–8.0)

## 2020-08-21 NOTE — Progress Notes (Signed)
Subjective:    CC: possible UTI  HPI: Pleasant 69 year old female presenting for evaluation of dysuria. Recently treated for a UTI with Augmentin that had to be stopped due to resistance.  Was subsequently treated with a one-time injection of Rocephin followed by Keflex. Notes that she has to awaken twice nightly for most nights to void.  When she is done voiding, she returns to her recliner and begins to experience what she describes as a pressure type sensation at the urethra.  No burning, frequency, urgency, hematuria, fever, chills, or abdominal pain.  Endorses having several bowel movements yesterday, soft but not diarrhea.  Has been using Premarin vaginally 4 nights a week.  Has questions regarding Replens and if this may help with her vaginal dryness.  Is very worried about her overall health today and wonders why she feels awful some days and other days she feels okay.  Admits to constant high anxiety about her overall health as well as things happening in the community/world.  Admits sometimes her anxiety gets so high that she has no choice but to smoke a cigarette.  I reviewed the past medical history, family history, social history, surgical history, and allergies today and no changes were needed.  Please see the problem list section below in epic for further details.  Past Medical History: Past Medical History:  Diagnosis Date  . Acid reflux   . Anxiety   . High blood pressure   . High serum parathyroid hormone (PTH) 07/19/2017   96 06/28/2014, 162 12/29/16  . Hypothyroid   . PTSD (post-traumatic stress disorder)    Past Surgical History: History reviewed. No pertinent surgical history. Social History: Social History   Socioeconomic History  . Marital status: Divorced    Spouse name: Not on file  . Number of children: 2  . Years of education: 37  . Highest education level: Associate degree: academic program  Occupational History  . 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 Smoking status: Current Some Day Smoker    Packs/day: 0.25    Years: 50.00    Pack years: 12.50    Types: Cigarettes  . Smokeless tobacco: Never Used  Vaping Use  . Vaping Use: Never used  Substance and Sexual Activity  . Alcohol use: No  . Drug use: No  . Sexual activity: Not Currently  Other Topics Concern  . Not on file  Social History Narrative  . Not on file   Social Determinants of Health   Financial Resource Strain:   . Difficulty of Paying Living Expenses: Not on file  Food Insecurity:   . Worried About Programme researcher, broadcasting/film/video in the Last Year: Not on file  . Ran Out of Food in the Last Year: Not on file  Transportation Needs:   . Lack of Transportation (Medical): Not on file  . Lack of Transportation (Non-Medical): Not on file  Physical Activity:   . Days of Exercise per Week: Not on file  . Minutes of Exercise per Session: Not on file  Stress:   . Feeling of Stress : Not on file  Social Connections:   . Frequency of Communication with Friends and Family: Not on file  . Frequency of Social Gatherings with Friends and Family: Not on file  . Attends Religious Services: Not on file  . Active Member of Clubs or Organizations: Not on file  . Attends Banker Meetings: Not on file  . Marital  Status: Not on file   Family History: Family History  Problem Relation Age of Onset  . Anxiety disorder Mother   . OCD Daughter   . Alcohol abuse Father   . Anxiety disorder Sister    Allergies: Allergies  Allergen Reactions  . Bactrim [Sulfamethoxazole-Trimethoprim]     ACUTE RENAL fAILURE/HYPONATREMIA/HYPERKALEMIA  . Macrodantin [Nitrofurantoin Macrocrystal] Rash  . Trimethoprim     Fatigue/weakness/no appetite  . Zithromax [Azithromycin]     diarrhea   Medications: See med rec.  Review of Systems: See HPI for pertinent positives and negatives.   Objective:    General: Well Developed, well nourished, and in no acute  distress.  Neuro: Alert and oriented x3.  HEENT: Normocephalic, atraumatic.  Skin: Warm and dry. Cardiac: Regular rate and rhythm, no murmurs rubs or gallops, no lower extremity edema.  Respiratory: Diminished to auscultation bilaterally. Not using accessory muscles, speaking in full sentences.  Using oxygen via nasal cannula at 2.5 L/min. Abdomen: Soft, nontender, nondistended. Bowel sounds + x 4 quadrants. No HSM appreciated.  No CVA tenderness.  Impression and Recommendations:    1. Dysuria POCT UA positive for trace leukocytes but negative for nitrites, protein, and blood.  Results look better than UA checked 2 weeks ago.  We will go ahead and send for culture.  Continue using Premarin as prescribed.  Okay to try Replens to see if this is beneficial.  Consider GI etiology for bladder pressure.  Increase p.o. fluids and continue to practice good bladder hygiene.  Once results are available, if any growth we will treat accordingly.  Reassurance provided that there is a low suspicion for continued UTI and a very low risk for any kind of sepsis related to it. - POCT URINALYSIS DIP (CLINITEK) - Urine Culture  Return if symptoms worsen or fail to improve. ___________________________________________ Thayer Ohm, DNP, APRN, FNP-BC Primary Care and Sports Medicine [facility]

## 2020-08-21 NOTE — Telephone Encounter (Signed)
Must be with provider

## 2020-08-21 NOTE — Telephone Encounter (Signed)
Patient would like a nurse visit for possible UTI. Can this be done or does it have to be an appointment with a provider.

## 2020-08-21 NOTE — Telephone Encounter (Signed)
Appointment has been made

## 2020-08-23 ENCOUNTER — Encounter: Payer: Self-pay | Admitting: Nurse Practitioner

## 2020-08-23 LAB — URINE CULTURE
MICRO NUMBER:: 10966616
SPECIMEN QUALITY:: ADEQUATE

## 2020-08-24 ENCOUNTER — Telehealth (HOSPITAL_COMMUNITY): Payer: Self-pay

## 2020-08-24 ENCOUNTER — Other Ambulatory Visit (HOSPITAL_COMMUNITY): Payer: Self-pay | Admitting: Psychiatry

## 2020-08-24 NOTE — Telephone Encounter (Signed)
Patient is requesting a refill on clonazepam. [facility]

## 2020-08-24 NOTE — Telephone Encounter (Signed)
sent 

## 2020-08-27 ENCOUNTER — Telehealth (INDEPENDENT_AMBULATORY_CARE_PROVIDER_SITE_OTHER): Payer: Medicare Other | Admitting: Physician Assistant

## 2020-08-27 ENCOUNTER — Other Ambulatory Visit: Payer: Self-pay

## 2020-08-27 DIAGNOSIS — J9611 Chronic respiratory failure with hypoxia: Secondary | ICD-10-CM

## 2020-08-27 DIAGNOSIS — E039 Hypothyroidism, unspecified: Secondary | ICD-10-CM

## 2020-08-27 DIAGNOSIS — F418 Other specified anxiety disorders: Secondary | ICD-10-CM

## 2020-08-27 DIAGNOSIS — N390 Urinary tract infection, site not specified: Secondary | ICD-10-CM

## 2020-08-27 DIAGNOSIS — K580 Irritable bowel syndrome with diarrhea: Secondary | ICD-10-CM | POA: Diagnosis not present

## 2020-08-27 MED ORDER — LEVOTHYROXINE SODIUM 75 MCG PO TABS: 75.0000 ug | ORAL_TABLET | Freq: Every day | ORAL | 0 refills | Status: DC

## 2020-08-27 NOTE — Progress Notes (Signed)
Still having stomach issues. Had a GI virtual appt who is having patient do a stool sample.

## 2020-08-27 NOTE — Progress Notes (Signed)
Patient ID: Claire [name], female   DOB: Aug 15, 1951, 69 y.o.   MRN: [id number] .[name] KitchenVirtual Visit via Telephone Note  I connected with Claire [name] on 08/27/2020 at  2:40 PM EDT by telephone and verified that I am speaking with the correct person using two identifiers.  Location: Patient: home Provider: clinic   I discussed the limitations, risks, security and privacy concerns of performing an evaluation and management service by telephone and the availability of in person appointments. I also discussed with the patient that there may be a patient responsible charge related to this service. The patient expressed understanding and agreed to proceed.   History of Present Illness: Patient is a 69 year old female with COPD on chronic O2 usage, hypothyroidism, IBS with diarrhea and constipation, CKD 3, anxiety about health.  She would like to update me on her GI visit. Her GI doctor does want to consider treating her for more inflammatory colitis symptoms. He would like to get some blood work then consider Entocort. He did increase her Bentyl and probiotics which does seem to help some with her diarrhea symptoms.  Her oxygen and breathing is stable. She continues to use oxygen most of the time and always at night. She is really not leaving her house. She continues to have slightly productive cough most days. She denies any fever, chills, body aches.  She has not started Lexapro. She is concerned about starting any medication at night now.  She is taking her thyroid medication daily. She has not had labs recently due to not having dependable transportation and fear of coming into the lab during covid pandemic.  .. Active Ambulatory Problems    Diagnosis Date Noted  . PTSD (post-traumatic stress disorder) 11/10/2011  . Neurosis, anxiety, panic type 11/10/2011  . Acquired hypothyroidism 11/24/2014  . BP (high blood pressure) 07/24/2012  . Chronic use of benzodiazepine for therapeutic purpose  08/11/2015  . Hyponatremia 12/19/2016  . Hyperkalemia 12/19/2016  . Centrilobular emphysema (HCC) 12/20/2016  . Stage 3a chronic kidney disease 01/16/2017  . Iron deficiency anemia 01/16/2017  . Anemia of chronic disease 05/11/2017  . Low serum vitamin B12 05/14/2017  . Vitamin D deficiency 05/14/2017  . Depressed mood 05/14/2017  . High serum parathyroid hormone (PTH) 07/19/2017  . Normocytic anemia 07/19/2017  . DOE (dyspnea on exertion) 05/09/2018  . Recurrent UTI 05/07/2019  . Leg cramps 05/29/2019  . No energy 05/29/2019  . History of hypokalemia 10/09/2019  . Vaccine counseling 12/24/2019  . Menopausal vaginal dryness 12/24/2019  . Chronic rhinitis 01/31/2020  . Chronic respiratory failure with hypoxia (HCC) 01/31/2020  . Weight loss 02/19/2020  . Macrocytic anemia 03/10/2020  . Elevated ferritin 03/10/2020  . Irritable bowel syndrome with diarrhea 03/10/2020  . Anxiety about health 03/23/2020  . Pernicious anemia 03/23/2020  . Malnutrition of mild degree (HCC) 05/06/2020  . Body mass index (BMI) of 19 or less in adult 05/08/2020  . Drug-induced constipation 07/13/2020  . Lower extremity edema 07/29/2020   Resolved Ambulatory Problems    Diagnosis Date Noted  . Hypothyroid   . Acute renal failure (HCC) 12/19/2016  . Decreased hemoglobin 12/20/2016  . Acute kidney injury (HCC) 03/17/2017  . Sinobronchitis 06/16/2020   Past Medical History:  Diagnosis Date  . Acid reflux   . Anxiety   . High blood pressure    Reviewed med, allergy, problem list.    Observations/Objective: No acute distress Anxious about health.  Productive cough on video.   [name].There were no  vitals filed for this visit. There is no height or weight on file to calculate BMI.     Assessment and Plan: [name] KitchenMarland KitchenDiagnoses and all orders for this visit:  Irritable bowel syndrome with diarrhea  Chronic respiratory failure with hypoxia (HCC)  Acquired hypothyroidism -     levothyroxine  (SYNTHROID) 75 MCG tablet; Take 1 tablet (75 mcg total) by mouth daily.  Recurrent UTI  Anxiety about health -     levothyroxine (SYNTHROID) 75 MCG tablet; Take 1 tablet (75 mcg total) by mouth daily.   UTI is likely directly related to GI complaints. She is seeing GI and doing some labs as well as some future EGD. He will consider treating with Entocort per his note. Hopefully if GI symptoms improve UTI symptoms will as well. Continue to use premarin for prevention.   Breathing is stable. Continue on dulera, spiriva, oxygen, nebulizer as needed.   Need TSH. Will get in next blood work that is done. Refilled levothyroxine for now.   Reassurance that she is doing well. Encouraged lexapro start to help with anxiety. Pt would like to hold for now starting a new medication.   Get covid vaccine recommended. dis     Follow Up Instructions:    I discussed the assessment and treatment plan with the patient. The patient was provided an opportunity to ask questions and all were answered. The patient agreed with the plan and demonstrated an understanding of the instructions.   The patient was advised to call back or seek an in-person evaluation if the symptoms worsen or if the condition fails to improve as anticipated.  I provided 15 minutes of non-face-to-face time during this encounter.   Tandy Gaw, PA-C

## 2020-08-31 ENCOUNTER — Encounter: Payer: Self-pay | Admitting: Physician Assistant

## 2020-09-02 ENCOUNTER — Telehealth: Payer: Self-pay

## 2020-09-02 NOTE — Telephone Encounter (Signed)
Claire [name] called and states she had the GI to order the labs Claire [name] wanted her to have drawn. The labs are in [facility]. CBC CMP. She would like Claire [name] to review and give advise.

## 2020-09-04 NOTE — Telephone Encounter (Signed)
Such GREAT news. Your GFR is 68 that is fabulous. Your hemoglobin is stable at 9.8 and better than last recheck.

## 2020-09-04 NOTE — Telephone Encounter (Signed)
Patient advised.

## 2020-09-07 ENCOUNTER — Telehealth: Payer: Self-pay | Admitting: Neurology

## 2020-09-07 NOTE — Telephone Encounter (Signed)
Ok thanks 

## 2020-09-07 NOTE — Telephone Encounter (Signed)
Patient left a vm stating she tested positive for C-diff and is being treated. She wanted to make sure [name] was aware. FYI.

## 2020-09-09 ENCOUNTER — Telehealth (INDEPENDENT_AMBULATORY_CARE_PROVIDER_SITE_OTHER): Payer: Medicare Other | Admitting: Physician Assistant

## 2020-09-09 VITALS — Ht 63.0 in | Wt 100.0 lb

## 2020-09-09 DIAGNOSIS — A0472 Enterocolitis due to Clostridium difficile, not specified as recurrent: Secondary | ICD-10-CM

## 2020-09-09 NOTE — Progress Notes (Signed)
Diagnosed with C-diff 08/31/2020 Given Vancomycin Managed by Dr. Opal Sidles [facility]

## 2020-09-11 ENCOUNTER — Encounter: Payer: Self-pay | Admitting: Physician Assistant

## 2020-09-11 DIAGNOSIS — A0472 Enterocolitis due to Clostridium difficile, not specified as recurrent: Secondary | ICD-10-CM | POA: Insufficient documentation

## 2020-09-11 NOTE — Progress Notes (Signed)
Patient ID: Claire [name], female   DOB: 12-21-1950, 69 y.o.   MRN: [id number] .[name] KitchenVirtual Visit via Telephone Note  I connected with Claire [name] on 09/09/2020 at  3:20 PM EDT by telephone and verified that I am speaking with the correct person using two identifiers.  Location: Patient: home Provider: clinic   I discussed the limitations, risks, security and privacy concerns of performing an evaluation and management service by telephone and the availability of in person appointments. I also discussed with the patient that there may be a patient responsible charge related to this service. The patient expressed understanding and agreed to proceed.   History of Present Illness: Pt is a 69 yo female who was dx with C.diff by GI and started on vancomycin. She would like to discuss this with me. She is improving and doing well. She has close follow up with GI.   .. Active Ambulatory Problems    Diagnosis Date Noted  . PTSD (post-traumatic stress disorder) 11/10/2011  . Neurosis, anxiety, panic type 11/10/2011  . Acquired hypothyroidism 11/24/2014  . BP (high blood pressure) 07/24/2012  . Chronic use of benzodiazepine for therapeutic purpose 08/11/2015  . Hyponatremia 12/19/2016  . Hyperkalemia 12/19/2016  . Centrilobular emphysema (HCC) 12/20/2016  . Stage 3a chronic kidney disease (HCC) 01/16/2017  . Iron deficiency anemia 01/16/2017  . Anemia of chronic disease 05/11/2017  . Low serum vitamin B12 05/14/2017  . Vitamin D deficiency 05/14/2017  . Depressed mood 05/14/2017  . High serum parathyroid hormone (PTH) 07/19/2017  . Normocytic anemia 07/19/2017  . DOE (dyspnea on exertion) 05/09/2018  . Recurrent UTI 05/07/2019  . Leg cramps 05/29/2019  . No energy 05/29/2019  . History of hypokalemia 10/09/2019  . Vaccine counseling 12/24/2019  . Menopausal vaginal dryness 12/24/2019  . Chronic rhinitis 01/31/2020  . Chronic respiratory failure with hypoxia (HCC) 01/31/2020  . Weight loss  02/19/2020  . Macrocytic anemia 03/10/2020  . Elevated ferritin 03/10/2020  . Irritable bowel syndrome with diarrhea 03/10/2020  . Anxiety about health 03/23/2020  . Pernicious anemia 03/23/2020  . Malnutrition of mild degree (HCC) 05/06/2020  . Body mass index (BMI) of 19 or less in adult 05/08/2020  . Drug-induced constipation 07/13/2020  . Lower extremity edema 07/29/2020  . Clostridium difficile colitis 09/11/2020   Resolved Ambulatory Problems    Diagnosis Date Noted  . Hypothyroid   . Acute renal failure (HCC) 12/19/2016  . Decreased hemoglobin 12/20/2016  . Acute kidney injury (HCC) 03/17/2017  . Sinobronchitis 06/16/2020   Past Medical History:  Diagnosis Date  . Acid reflux   . Anxiety   . High blood pressure    Reviewed med, allergies and problem list.     Observations/Objective: No acute distress Normal breathing No labored breathing  .[name] Today's Vitals   09/09/20 1337  Weight: 100 lb (45.4 kg)  Height: 5\' 3"  (1.6 m)   Body mass index is 17.71 kg/m.    Assessment and Plan: [name] KitchenNashley was seen today for diarrhea.  Diagnoses and all orders for this visit:  Clostridium difficile colitis   Pt managed by GI at [facility]. Reassured her that treatment plan is great for her. Discussed causes of C.diff and management of antibiotics in the future. She needs to stay on probiotic. We do need avoid antbiotics until known infection recurrent UTI and her COPD flares are led her to occasional abx usage. Continue to push fluids and keep eating now. She does seem to be improving.  Follow Up Instructions:    I discussed the assessment and treatment plan with the patient. The patient was provided an opportunity to ask questions and all were answered. The patient agreed with the plan and demonstrated an understanding of the instructions.   The patient was advised to call back or seek an in-person evaluation if the symptoms worsen or if the condition fails to improve as  anticipated.  I provided 20 minutes of non-face-to-face time during this encounter.   Tandy Gaw, PA-C

## 2020-09-21 ENCOUNTER — Telehealth: Payer: Self-pay

## 2020-09-21 NOTE — Telephone Encounter (Signed)
Patient called wanting PCP to know that she was in hospital for C Diff and low sodium.   I have patient scheduled for 1 week from today. She also is following up with GI.  FYI to PCP

## 2020-09-21 NOTE — Telephone Encounter (Signed)
Thanks

## 2020-09-22 ENCOUNTER — Other Ambulatory Visit (HOSPITAL_COMMUNITY): Payer: Self-pay | Admitting: Psychiatry

## 2020-09-23 ENCOUNTER — Telehealth (INDEPENDENT_AMBULATORY_CARE_PROVIDER_SITE_OTHER): Payer: Medicare Other | Admitting: Psychiatry

## 2020-09-23 ENCOUNTER — Encounter (HOSPITAL_COMMUNITY): Payer: Self-pay | Admitting: Psychiatry

## 2020-09-23 DIAGNOSIS — F410 Panic disorder [episodic paroxysmal anxiety] without agoraphobia: Secondary | ICD-10-CM | POA: Diagnosis not present

## 2020-09-23 DIAGNOSIS — F4310 Post-traumatic stress disorder, unspecified: Secondary | ICD-10-CM

## 2020-09-23 DIAGNOSIS — F411 Generalized anxiety disorder: Secondary | ICD-10-CM

## 2020-09-23 NOTE — Progress Notes (Signed)
Patient ID: Claire [name], female   DOB: [birth year], 69 y.o.   MRN: [id number]  [facility] Outpatient Follow up visit [address] [phone] 69 y.o.  09/23/2020 2:19 PM  Chief Complaint:  Anxiety follow up, med review  History of Present Illness:      I connected with Claire [name] on 09/23/20 at  2:00 PM EDT by telephone and verified that I am speaking with the correct person using two identifiers. I discussed the limitations, risks, security and privacy concerns of performing an evaluation and management service by telephone and the availability of in person appointments. I also discussed with the patient that there may be a patient responsible charge related to this service. The patient expressed understanding and agreed to proceed.  Patient location home Provider location home office   Has been doing fair but recently admitted for low sodium and stabilized, following instructions to balance hydration.  Mood wise fair, son and daughter supportive Gets anxious and worriful, klonopine helps other meds SSRI were tried in past did not help    Modifying factor: breahting techniques. grandkids   PTSD is related to her past some flashbacks. Distraction helps  Depression manageable No side effects     Aggravating factors: past childhood,  No psychosis  Medical History; Past Medical History:  Diagnosis Date  . Acid reflux   . Anxiety   . High blood pressure   . High serum parathyroid hormone (PTH) 07/19/2017   96 06/28/2014, 162 12/29/16  . Hypothyroid   . PTSD (post-traumatic stress disorder)     Allergies: Allergies  Allergen Reactions  . Bactrim [Sulfamethoxazole-Trimethoprim]     ACUTE RENAL fAILURE/HYPONATREMIA/HYPERKALEMIA  . Macrodantin [Nitrofurantoin Macrocrystal] Rash  . Trimethoprim     Fatigue/weakness/no appetite  . Zithromax [Azithromycin]     diarrhea    Medications: Outpatient Encounter Medications as of  09/23/2020  Medication Sig  . albuterol (PROAIR HFA) 108 (90 Base) MCG/ACT inhaler INHALE 1-2 PUFFS INTO THE LUNGS EVERY 4 (FOUR) HOURS AS NEEDED FOR WHEEZING OR SHORTNESS OF BREATH.  [name] albuterol (PROVENTIL) (2.5 MG/3ML) 0.083% nebulizer solution 1 VIAL IN NEBULIZER EVERY 4 HOURS AS NEEDED WHEEZING OR FOR SHORTNESS OF BREATH  . AMBULATORY NON FORMULARY MEDICATION Continuous stationary and portable oxygen tanks for use with ambulation. DX: COPD with hypoxia.  . AMBULATORY NON FORMULARY MEDICATION Home concentrator and one Imogen G3 portable oxygen concentrator for hypoxia.  . AMBULATORY NON FORMULARY MEDICATION Portable oxygen compressor device for portable O2 administration continuous 2L/min while walking. Dx COPD w/ hypoxia. Titrate for OCD (oxygen conserving device).  . AMBULATORY NON FORMULARY MEDICATION One pulse ox to manage SOB and O2 use.  . AMBULATORY NON FORMULARY MEDICATION Portable oxygen concentrator at 2L of continuous oxygen to use with ambulation for COPD and chronic hypoxemia.  [name] atenolol (TENORMIN) 25 MG tablet Take 1 tablet (25 mg total) by mouth daily.  . cetirizine (ZYRTEC) 10 MG chewable tablet Chew 10 mg by mouth daily.  . cholecalciferol (VITAMIN D) 25 MCG (1000 UT) tablet Take 1,000 Units by mouth daily.  . clonazePAM (KLONOPIN) 0.5 MG tablet TAKE ONE TABLET BY MOUTH THREE TIMES DAILY  . dextromethorphan-guaiFENesin (MUCINEX DM) 30-600 MG 12hr tablet Take 1 tablet by mouth 2 (two) times daily. (Patient taking differently: Take 1 tablet by mouth 2 (two) times daily as needed. )  . dicyclomine (BENTYL) 10 MG capsule Take 1 capsule (10 mg total) by mouth 3 (three) times daily before meals.  [name]  escitalopram (LEXAPRO) 10 MG tablet Take 1/2 tablet for 7 days then increase to one tablet daily.  [name]. estradiol (ESTRACE VAGINAL) 0.1 MG/GM vaginal cream Place 1 Applicatorful vaginally 3 (three) times a week. And one pea-size amt applied to urethral opening.  . famotidine (PEPCID) 20 MG  tablet TAKE 1 TABLET BY MOUTH TWICE A DAY (Patient taking differently: Take 20 mg by mouth daily as needed. )  . ferrous sulfate 325 (65 FE) MG EC tablet 1 tab PO TID every other day  . levothyroxine (SYNTHROID) 75 MCG tablet Take 1 tablet (75 mcg total) by mouth daily.  [name]. lidocaine (LIDODERM) 5 % Place 1 patch onto the skin daily. Remove & Discard patch within 12 hours or as directed by MD (Patient taking differently: Place 1 patch onto the skin daily. Remove & Discard patch within 12 hours PRN or as directed by MD)  . mometasone (NASONEX) 50 MCG/ACT nasal spray One spray in each nostril twice a day, use left hand for right nostril, and right hand for left nostril.  Please dispense one bottle.  . mometasone-formoterol (DULERA) 100-5 MCG/ACT AERO Inhale 2 puffs into the lungs in the morning and at bedtime.  [name]. SPIRIVA RESPIMAT 2.5 MCG/ACT AERS Inhale 2 puffs into the lungs daily.  . vancomycin (VANCOCIN) 125 MG capsule Take 125 mg by mouth 4 (four) times daily.  . [DISCONTINUED] mirtazapine (REMERON) 7.5 MG tablet TAKE 1 TABLET (7.5 MG TOTAL) BY MOUTH AT BEDTIME.   No facility-administered encounter medications on file as of 09/23/2020.    Family History; Family History  Problem Relation Age of Onset  . Anxiety disorder Mother   . OCD Daughter   . Alcohol abuse Father   . Anxiety disorder Sister        Labs:  Recent Results (from the past 2160 hour(s))  POCT URINALYSIS DIP (CLINITEK)     Status: Abnormal   Collection Time: 07/27/20  2:32 PM  Result Value Ref Range   Color, UA light yellow (A) yellow   Clarity, UA turbid (A) clear   Glucose, UA negative negative mg/dL   Bilirubin, UA negative negative   Ketones, POC UA negative negative mg/dL   Spec Grav, UA 1.6101.015 9.6041.010 - 1.025   Blood, UA moderate (A) negative   pH, UA 6.5 5.0 - 8.0   POC PROTEIN,UA =30 (A) negative, trace   Urobilinogen, UA 0.2 0.2 or 1.0 E.U./dL   Nitrite, UA Positive (A) Negative   Leukocytes, UA Large  (3+) (A) Negative  Urinalysis, Routine w reflex microscopic     Status: Abnormal   Collection Time: 07/27/20  2:47 PM  Result Value Ref Range   Color, Urine YELLOW YELLOW   APPearance TURBID (A) CLEAR   Specific Gravity, Urine 1.010 1.001 - 1.03   pH 6.0 5.0 - 8.0   Glucose, UA NEGATIVE NEGATIVE   Bilirubin Urine NEGATIVE NEGATIVE   Ketones, ur NEGATIVE NEGATIVE   Hgb urine dipstick 1+ (A) NEGATIVE   Protein, ur 1+ (A) NEGATIVE   Nitrite POSITIVE (A) NEGATIVE   Leukocytes,Ua 3+ (A) NEGATIVE   WBC, UA PACKED (A) 0 - 5 /HPF   RBC / HPF 3-10 (A) 0 - 2 /HPF   Squamous Epithelial / LPF NONE SEEN < OR = 5 /HPF   Bacteria, UA MANY (A) NONE SEEN /HPF   Hyaline Cast NONE SEEN NONE SEEN /LPF  Urine Culture     Status: Abnormal   Collection Time: 07/27/20  2:47 PM  Specimen: Urine  Result Value Ref Range   MICRO NUMBER: [id number]    SPECIMEN QUALITY: Adequate    Sample Source URINE    STATUS: FINAL    ISOLATE 1: Escherichia coli (A)     Comment: Greater than 100,000 CFU/mL of Escherichia coli      Susceptibility   Escherichia coli - URINE CULTURE, REFLEX    AMOX/CLAVULANIC 16 Intermediate     AMPICILLIN >=32 Resistant     AMPICILLIN/SULBACTAM >=32 Resistant     CEFAZOLIN* 16 Resistant      * For uncomplicated UTI caused by E. coli,K. pneumoniae or P. mirabilis: Cefazolin issusceptible if MIC <32 mcg/mL and predictssusceptible to the oral agents cefaclor, cefdinir,cefpodoxime, cefprozil, cefuroxime, cephalexinand loracarbef.    CEFEPIME <=1 Sensitive     CEFTRIAXONE <=1 Sensitive     CIPROFLOXACIN >=4 Resistant     LEVOFLOXACIN >=8 Resistant     ERTAPENEM <=0.5 Sensitive     GENTAMICIN <=1 Sensitive     IMIPENEM <=0.25 Sensitive     NITROFURANTOIN <=16 Sensitive     PIP/TAZO >=128 Resistant     TOBRAMYCIN <=1 Sensitive     TRIMETH/SULFA* <=20 Sensitive      * For uncomplicated UTI caused by E. coli,K. pneumoniae or P. mirabilis: Cefazolin issusceptible if MIC <32 mcg/mL and  predictssusceptible to the oral agents cefaclor, cefdinir,cefpodoxime, cefprozil, cefuroxime, cephalexinand loracarbef.Legend:S = Susceptible  I = IntermediateR = Resistant  NS = Not susceptible* = Not tested  NR = Not reported**NN = See antimicrobic comments  Urine Culture     Status: None   Collection Time: 08/06/20 12:07 PM   Specimen: Urine  Result Value Ref Range   MICRO NUMBER: [id number]    SPECIMEN QUALITY: Adequate    Sample Source URINE    STATUS: FINAL    Result: No Growth   POCT URINALYSIS DIP (CLINITEK)     Status: Abnormal   Collection Time: 08/06/20 12:19 PM  Result Value Ref Range   Color, UA light yellow (A) yellow   Clarity, UA clear clear   Glucose, UA negative negative mg/dL   Bilirubin, UA negative negative   Ketones, POC UA negative negative mg/dL   Spec Grav, UA 9.242 6.834 - 1.025   Blood, UA negative negative   pH, UA 7.0 5.0 - 8.0   POC PROTEIN,UA negative negative, trace   Urobilinogen, UA 0.2 0.2 or 1.0 E.U./dL   Nitrite, UA Negative Negative   Leukocytes, UA Moderate (2+) (A) Negative  POCT URINALYSIS DIP (CLINITEK)     Status: Abnormal   Collection Time: 08/21/20  2:13 PM  Result Value Ref Range   Color, UA light yellow (A) yellow   Clarity, UA clear clear   Glucose, UA negative negative mg/dL   Bilirubin, UA negative negative   Ketones, POC UA negative negative mg/dL   Spec Grav, UA 1.962 2.297 - 1.025   Blood, UA negative negative   pH, UA 6.5 5.0 - 8.0   POC PROTEIN,UA negative negative, trace   Urobilinogen, UA 0.2 0.2 or 1.0 E.U./dL   Nitrite, UA Negative Negative   Leukocytes, UA Trace (A) Negative  Urine Culture     Status: None   Collection Time: 08/21/20  2:14 PM   Specimen: Urine  Result Value Ref Range   MICRO NUMBER: [id number]    SPECIMEN QUALITY: Adequate    Sample Source NOT GIVEN    STATUS: FINAL    Result:      Growth of mixed flora  was isolated, suggesting probable contamination. No further testing will be performed. If  clinically indicated, recollection using a method to minimize contamination, with prompt transfer to Urine Culture Transport Tube, is  recommended.       Mental Status Examination;   Psychiatric Specialty Exam: Physical Exam  Review of Systems  Cardiovascular: Negative for chest pain.  Psychiatric/Behavioral: Negative for depression and suicidal ideas.    There were no vitals taken for this visit.There is no height or weight on file to calculate BMI.  General Appearance:   Eye Contact::    Speech:  Slow  Volume:  Decreased  Mood: fair  Affect:  congruent  Thought Process:  Coherent and intact  Orientation:  Full (Time, Place, and Person)  Thought Content:  Rumination  Suicidal Thoughts:  No  Homicidal Thoughts:  No  Memory:  Immediate;   Fair Recent;   Fair  Judgement:  Fair  Insight:  Shallow  Psychomotor Activity:  Normal  Concentration:  Fair  Recall:  Fair  Akathisia:  Negative  Handed:  Right  AIMS (if indicated):     Assets:  Social Support Vocational/Educational  Sleep:        Assessment: Axis I: Panic disorder. Possible PTSD. Rule out generalized anxiety disorder. Nicotine dependence  Axis II: Deferred  Axis III:  Past Medical History:  Diagnosis Date  . Acid reflux   . Anxiety   . High blood pressure   . High serum parathyroid hormone (PTH) 07/19/2017   96 06/28/2014, 162 12/29/16  . Hypothyroid   . PTSD (post-traumatic stress disorder)     Axis IV: Psychosocial. History of trauma   Treatment Plan and Summary:  Anxiety and GAD: manageable on klonopine, understands the risk of being on benzo long term, does not want to change   PTSD:  Baseline, continue on distraction techniques Does not want to be on sSRi  But just klonopine   FU with providers in regard to medical comorbidies I discussed the assessment and treatment plan with the patient. The patient was provided an opportunity to ask questions and all were answered. The patient agreed  with the plan and demonstrated an understanding of the instructions.   The patient was advised to call back or seek an in-person evaluation if the symptoms worsen or if the condition fails to improve as anticipated.  I provided 15 minutes of non-face-to-face time during this encounter. Fu 53m.   Thresa Ross, MD 09/23/2020

## 2020-09-28 ENCOUNTER — Other Ambulatory Visit: Payer: Self-pay

## 2020-09-28 ENCOUNTER — Other Ambulatory Visit: Payer: Self-pay | Admitting: Physician Assistant

## 2020-09-28 ENCOUNTER — Encounter: Payer: Self-pay | Admitting: Physician Assistant

## 2020-09-28 ENCOUNTER — Ambulatory Visit (INDEPENDENT_AMBULATORY_CARE_PROVIDER_SITE_OTHER): Payer: Medicare Other | Admitting: Physician Assistant

## 2020-09-28 VITALS — BP 125/71 | HR 98 | Ht 63.0 in | Wt 99.0 lb

## 2020-09-28 DIAGNOSIS — K580 Irritable bowel syndrome with diarrhea: Secondary | ICD-10-CM | POA: Diagnosis not present

## 2020-09-28 DIAGNOSIS — E441 Mild protein-calorie malnutrition: Secondary | ICD-10-CM | POA: Diagnosis not present

## 2020-09-28 DIAGNOSIS — E871 Hypo-osmolality and hyponatremia: Secondary | ICD-10-CM | POA: Diagnosis not present

## 2020-09-28 DIAGNOSIS — Z8619 Personal history of other infectious and parasitic diseases: Secondary | ICD-10-CM

## 2020-09-28 DIAGNOSIS — Z7185 Encounter for immunization safety counseling: Secondary | ICD-10-CM

## 2020-09-28 MED ORDER — DICYCLOMINE HCL 10 MG PO CAPS: 10.0000 mg | ORAL_CAPSULE | Freq: Three times a day (TID) | ORAL | 2 refills | Status: DC

## 2020-09-28 NOTE — Patient Instructions (Signed)
Protein-Energy Malnutrition Protein-energy malnutrition is when a person does not eat enough protein, fat, and calories. When this happens over time, it can lead to severe loss of muscle tissue (muscle wasting). This condition also affects the body's defense system (immune system) and can lead to other health problems. What are the causes? This condition may be caused by:  Not eating enough protein, fat, or calories.  Having certain chronic medical conditions.  Eating too little. What increases the risk? The following factors may make you more likely to develop this condition:  Living in poverty.  Long-term hospitalization.  Alcohol or drug dependency. Addiction often leads to a lifestyle in which proper diet is ignored. Dependency can also hurt the metabolism and the body's ability to absorb nutrients.  Eating disorders, such as anorexia nervosa or bulimia.  Chewing or swallowing problems. People with these disorders may not eat enough.  Having certain conditions, such as: ? Inflammatory bowel disease. Inflammation of the intestines makes it difficult for the body to absorb nutrients. ? Cancer or AIDS. These diseases can cause a loss of appetite. ? Chronic heart failure. This interferes with how the body uses nutrients. ? Cystic fibrosis. This disease can make it difficult for the body to absorb nutrients.  Eating a diet that extremely restricts protein, fat, or calorie intake. What are the signs or symptoms? Symptoms of this condition include:  Fatigue.  Weakness.  Dizziness.  Fainting.  Weight loss.  Loss of muscle tone and muscle mass.  Poor immune response.  Lack of menstruation.  Poor memory.  Hair loss.  Skin changes. How is this diagnosed? This condition may be diagnosed based on:  Your medical and dietary history.  A physical exam. This may include a measurement of your body mass index (BMI).  Blood tests. How is this treated? This condition may  be managed with:  Nutrition therapy. This may include working with a diet and nutrition specialist (dietitian).  Treatment for underlying conditions. People with severe protein-energy malnutrition may need to be treated in a hospital. This may involve receiving nutrition and fluids through an IV. Follow these instructions at home:   Eat a balanced diet. In each meal, include at least one food that is high in protein. Foods that are high in protein include: ? Meat. ? Poultry. ? Fish. ? Eggs. ? Cheese. ? Milk. ? Beans. ? Nuts.  Eat nutrient-rich foods that are easy to swallow and digest, such as: ? Fruit and yogurt smoothies. ? Oatmeal with nut butter.  Try to eat six small meals each day instead of three large meals.  Take vitamin and protein supplements as told by your health care provider or dietitian.  Follow your health care provider's recommendations about exercise and activity.  Keep all follow-up visits as told by your health care provider. This is important. Contact a health care provider if you:  Have increased weakness or fatigue.  Faint.  Are a woman and you stop having your period (menstruating).  Have rapid hair loss.  Have unexpected weight loss.  Have diarrhea.  Have nausea and vomiting. Get help right away if you have:  Difficulty breathing.  Chest pain. Summary  Protein-energy malnutrition is when a person does not eat enough protein, fat, and calories.  Protein-energy malnutrition can lead to severe loss of muscle tissue (muscle wasting). This condition also affects the body's defense system (immune system) and can lead to other health problems.  Talk with your health care provider about treatment for this   condition. Effective treatment depends on the underlying cause of the malnutrition. This information is not intended to replace advice given to you by your health care provider. Make sure you discuss any questions you have with your health  care provider. Document Revised: 12/06/2017 Document Reviewed: 12/06/2017 Elsevier Patient Education  2020 [employer]  

## 2020-09-28 NOTE — Progress Notes (Signed)
Subjective:    Patient ID: Claire [name], female    DOB: 1951/11/04, 69 y.o.   MRN: [id number]  HPI  Pt is a 69 yo female with COPD, Anxiety, C.diff colitis, hyponatremia who presents to the clinic for follow up.   She has been treated for C.diff and confirmed it was cleared. She started drinking a lot of water and then started feeling bad. She went to ED on 09/18/2020 and discharged on 09/20/2020. Her sodium was 120 that improved to 136 on discharge day, her potassium was 3. Hemoglobin 10.4, platelets 288. Suspect low sodium was from her drinking too much water.   She is feeling a little better today. Her diarrhea has resolved. She has gained 1lbs and started back eating some. She was previously worried about eating and diarrhea so she had calorie restricted.   .. Active Ambulatory Problems    Diagnosis Date Noted  . PTSD (post-traumatic stress disorder) 11/10/2011  . Neurosis, anxiety, panic type 11/10/2011  . Acquired hypothyroidism 11/24/2014  . BP (high blood pressure) 07/24/2012  . Chronic use of benzodiazepine for therapeutic purpose 08/11/2015  . Hyponatremia 12/19/2016  . Hyperkalemia 12/19/2016  . Centrilobular emphysema (HCC) 12/20/2016  . Stage 3a chronic kidney disease (HCC) 01/16/2017  . Iron deficiency anemia 01/16/2017  . Anemia of chronic disease 05/11/2017  . Low serum vitamin B12 05/14/2017  . Vitamin D deficiency 05/14/2017  . Depressed mood 05/14/2017  . High serum parathyroid hormone (PTH) 07/19/2017  . Normocytic anemia 07/19/2017  . DOE (dyspnea on exertion) 05/09/2018  . Recurrent UTI 05/07/2019  . Leg cramps 05/29/2019  . No energy 05/29/2019  . History of hypokalemia 10/09/2019  . Vaccine counseling 12/24/2019  . Menopausal vaginal dryness 12/24/2019  . Chronic rhinitis 01/31/2020  . Chronic respiratory failure with hypoxia (HCC) 01/31/2020  . Weight loss 02/19/2020  . Macrocytic anemia 03/10/2020  . Elevated ferritin 03/10/2020  . Irritable  bowel syndrome with diarrhea 03/10/2020  . Anxiety about health 03/23/2020  . Pernicious anemia 03/23/2020  . Malnutrition of mild degree (HCC) 05/06/2020  . Body mass index (BMI) of 19 or less in adult 05/08/2020  . Drug-induced constipation 07/13/2020  . Lower extremity edema 07/29/2020  . Clostridium difficile colitis 09/11/2020   Resolved Ambulatory Problems    Diagnosis Date Noted  . Hypothyroid   . Acute renal failure (HCC) 12/19/2016  . Decreased hemoglobin 12/20/2016  . Acute kidney injury (HCC) 03/17/2017  . Sinobronchitis 06/16/2020   Past Medical History:  Diagnosis Date  . Acid reflux   . Anxiety   . High blood pressure        Review of Systems See HPI.     Objective:   Physical Exam Vitals reviewed.  Constitutional:      Comments: Frail appearance.   HENT:     Head: Normocephalic.  Cardiovascular:     Rate and Rhythm: Normal rate and regular rhythm.     Pulses: Normal pulses.  Pulmonary:     Effort: Pulmonary effort is normal.     Comments: Distant diminished breath sounds.  On 2 L of O2.  Abdominal:     General: There is no distension.     Palpations: Abdomen is soft.     Tenderness: There is no abdominal tenderness. There is no right CVA tenderness, left CVA tenderness or guarding.  Musculoskeletal:     Right lower leg: No edema.     Left lower leg: Edema present.     Comments: Non  pitting scant ankle edema.   Neurological:     General: No focal deficit present.     Mental Status: She is alert and oriented to person, place, and time.  Psychiatric:     Comments: anxious           Assessment & Plan:  [name] KitchenMarland KitchenLucas was seen today for hospitalization follow-up.  Diagnoses and all orders for this visit:  Hyponatremia -     COMPLETE METABOLIC PANEL WITH GFR -     CBC with Differential/Platelet  History of Clostridioides difficile colitis  Malnutrition of mild degree (HCC) -     COMPLETE METABOLIC PANEL WITH GFR -     CBC with  Differential/Platelet  Irritable bowel syndrome with diarrhea -     dicyclomine (BENTYL) 10 MG capsule; Take 1 capsule (10 mg total) by mouth 3 (three) times daily before meals.  Vaccine counseling   C.diff has resolved. Continue to follow up with GI for next steps in GI health. Refilled bentyl to use for her IBS symptoms.   Will check CBC and CMP to confirm sodium is staying in the normal range.   Discussed malnutrition and making her feel weak. Encouraged her to eat at least 3 meals a day and 2 meal replacement boost/ensure shakes.   Reassurance that vitals are good today.   Strongly urged to get covid vaccine.   Follow up in 1 month.   Spent 35 minutes with patient discussing plan/vaccine/goals/nutrition.

## 2020-09-29 ENCOUNTER — Telehealth: Payer: Self-pay | Admitting: Physician Assistant

## 2020-09-29 NOTE — Telephone Encounter (Signed)
Patient wanting to speak to a nurse or Jade's nurse about results. States she has called multiple times and no one has called her back. Would like a call back today. States she's really upset and if she doesn't get a call back today she will be in pain all night and its not fair that no one has returned her call.

## 2020-09-29 NOTE — Progress Notes (Signed)
[name],   Your hemoglobin from our office shows 8.7. I looked back 10/17 was 8.0 in the hospital. I am not sure where the 10.4 readings came from in ED or if it dropped while in hospital. If last reading from hospital was right it is going in the right direction. Recheck in 1 week.

## 2020-09-29 NOTE — Telephone Encounter (Signed)
Will you please call her. I just resulted her labs.

## 2020-09-29 NOTE — Telephone Encounter (Signed)
Patient made aware about lab results.

## 2020-09-30 ENCOUNTER — Encounter: Payer: Self-pay | Admitting: Physician Assistant

## 2020-09-30 DIAGNOSIS — D508 Other iron deficiency anemias: Secondary | ICD-10-CM

## 2020-09-30 LAB — CBC WITH DIFFERENTIAL/PLATELET
Absolute Monocytes: 586 cells/uL (ref 200–950)
Basophils Absolute: 43 cells/uL (ref 0–200)
Basophils Relative: 0.7 %
Eosinophils Absolute: 171 cells/uL (ref 15–500)
Eosinophils Relative: 2.8 %
HCT: 26.5 % — ABNORMAL LOW (ref 35.0–45.0)
Hemoglobin: 8.7 g/dL — ABNORMAL LOW (ref 11.7–15.5)
Lymphs Abs: 1049 cells/uL (ref 850–3900)
MCH: 33.1 pg — ABNORMAL HIGH (ref 27.0–33.0)
MCHC: 32.8 g/dL (ref 32.0–36.0)
MCV: 100.8 fL — ABNORMAL HIGH (ref 80.0–100.0)
MPV: 9.3 fL (ref 7.5–12.5)
Monocytes Relative: 9.6 %
Neutro Abs: 4252 cells/uL (ref 1500–7800)
Neutrophils Relative %: 69.7 %
Platelets: 273 10*3/uL (ref 140–400)
RBC: 2.63 10*6/uL — ABNORMAL LOW (ref 3.80–5.10)
RDW: 10.5 % — ABNORMAL LOW (ref 11.0–15.0)
Total Lymphocyte: 17.2 %
WBC: 6.1 10*3/uL (ref 3.8–10.8)

## 2020-09-30 LAB — COMPLETE METABOLIC PANEL WITH GFR
AG Ratio: 1.4 (calc) (ref 1.0–2.5)
ALT: 6 U/L (ref 6–29)
AST: 13 U/L (ref 10–35)
Albumin: 3.8 g/dL (ref 3.6–5.1)
Alkaline phosphatase (APISO): 51 U/L (ref 37–153)
BUN: 10 mg/dL (ref 7–25)
CO2: 37 mmol/L — ABNORMAL HIGH (ref 20–32)
Calcium: 9.2 mg/dL (ref 8.6–10.4)
Chloride: 95 mmol/L — ABNORMAL LOW (ref 98–110)
Creat: 0.85 mg/dL (ref 0.50–0.99)
GFR, Est African American: 81 mL/min/{1.73_m2} (ref >=60)
GFR, Est Non African American: 70 mL/min/{1.73_m2} (ref >=60)
Globulin: 2.8 g/dL (calc) (ref 1.9–3.7)
Glucose, Bld: 84 mg/dL (ref 65–99)
Potassium: 4.2 mmol/L (ref 3.5–5.3)
Sodium: 137 mmol/L (ref 135–146)
Total Bilirubin: 0.3 mg/dL (ref 0.2–1.2)
Total Protein: 6.6 g/dL (ref 6.1–8.1)

## 2020-09-30 NOTE — Progress Notes (Signed)
Claire [name],   Your kidney function has amazingly improved! WOw this is great. Chloride/CO2 levels off a little but likely due to COPD and even subtle breathing changes. Not worried right now.   Labs are really good.

## 2020-10-02 ENCOUNTER — Other Ambulatory Visit: Payer: Self-pay | Admitting: Nurse Practitioner

## 2020-10-02 DIAGNOSIS — F410 Panic disorder [episodic paroxysmal anxiety] without agoraphobia: Secondary | ICD-10-CM

## 2020-10-02 DIAGNOSIS — F418 Other specified anxiety disorders: Secondary | ICD-10-CM

## 2020-10-06 LAB — CBC WITH DIFFERENTIAL/PLATELET
Basophils Absolute: 0.1 10*3/uL (ref 0.0–0.2)
Basos: 1 %
EOS (ABSOLUTE): 0.2 10*3/uL (ref 0.0–0.4)
Eos: 3 %
Hematocrit: 26.9 % — ABNORMAL LOW (ref 34.0–46.6)
Hemoglobin: 9 g/dL — ABNORMAL LOW (ref 11.1–15.9)
Immature Grans (Abs): 0 10*3/uL (ref 0.0–0.1)
Immature Granulocytes: 0 %
Lymphocytes Absolute: 1.4 10*3/uL (ref 0.7–3.1)
Lymphs: 23 %
MCH: 33.1 pg — ABNORMAL HIGH (ref 26.6–33.0)
MCHC: 33.5 g/dL (ref 31.5–35.7)
MCV: 99 fL — ABNORMAL HIGH (ref 79–97)
Monocytes Absolute: 0.6 10*3/uL (ref 0.1–0.9)
Monocytes: 9 %
Neutrophils Absolute: 3.9 10*3/uL (ref 1.4–7.0)
Neutrophils: 64 %
Platelets: 331 10*3/uL (ref 150–450)
RBC: 2.72 x10E6/uL — CL (ref 3.77–5.28)
RDW: 10.6 % — ABNORMAL LOW (ref 11.7–15.4)
WBC: 6.1 10*3/uL (ref 3.4–10.8)

## 2020-10-06 NOTE — Telephone Encounter (Signed)
Hemoglobin and RBC  better than 8 days ago. How are you feeling?

## 2020-10-09 ENCOUNTER — Telehealth (INDEPENDENT_AMBULATORY_CARE_PROVIDER_SITE_OTHER): Payer: Medicare Other | Admitting: Physician Assistant

## 2020-10-09 ENCOUNTER — Encounter: Payer: Self-pay | Admitting: Physician Assistant

## 2020-10-09 VITALS — Temp 98.6°F

## 2020-10-09 DIAGNOSIS — J310 Chronic rhinitis: Secondary | ICD-10-CM | POA: Diagnosis not present

## 2020-10-09 DIAGNOSIS — J441 Chronic obstructive pulmonary disease with (acute) exacerbation: Secondary | ICD-10-CM

## 2020-10-09 MED ORDER — AZELASTINE HCL 0.1 % NA SOLN: 2.0000 | Freq: Two times a day (BID) | NASAL | 2 refills | Status: DC

## 2020-10-09 MED ORDER — PREDNISONE 20 MG PO TABS: 20.0000 mg | ORAL_TABLET | Freq: Two times a day (BID) | ORAL | 0 refills | Status: DC

## 2020-10-09 NOTE — Progress Notes (Signed)
..Virtual Visit via Telephone Note  I connected with Claire [name] on 10/09/20 at  2:00 PM EDT by telephone and verified that I am speaking with the correct person using two identifiers.  Location: Patient: home Provider: clinic   I discussed the limitations, risks, security and privacy concerns of performing an evaluation and management service by telephone and the availability of in person appointments. I also discussed with the patient that there may be a patient responsible charge related to this service. The patient expressed understanding and agreed to proceed.   History of Present Illness: Pt is a 69 yo female with COPD, chronic rhinitis, seasonal allergies who calls into the clinic to discuss nasal congestion/rhinitis.   She is having a lot of nasal congestion and rhinorrhea. Zyrtec drys her up. She does not like flonase. Sinus drainage is now going down the back of her throat and causing worsening cough, sOB. Using albuterol regularly. Use O2 as needed during the day and every night. She continues to smoke. Her chest does feel tight.    .. Active Ambulatory Problems    Diagnosis Date Noted  . PTSD (post-traumatic stress disorder) 11/10/2011  . Neurosis, anxiety, panic type 11/10/2011  . Acquired hypothyroidism 11/24/2014  . BP (high blood pressure) 07/24/2012  . Chronic use of benzodiazepine for therapeutic purpose 08/11/2015  . Hyponatremia 12/19/2016  . Hyperkalemia 12/19/2016  . Centrilobular emphysema (HCC) 12/20/2016  . Stage 3a chronic kidney disease (HCC) 01/16/2017  . Iron deficiency anemia 01/16/2017  . Anemia of chronic disease 05/11/2017  . Low serum vitamin B12 05/14/2017  . Vitamin D deficiency 05/14/2017  . Depressed mood 05/14/2017  . High serum parathyroid hormone (PTH) 07/19/2017  . Normocytic anemia 07/19/2017  . DOE (dyspnea on exertion) 05/09/2018  . Recurrent UTI 05/07/2019  . Leg cramps 05/29/2019  . No energy 05/29/2019  . History of hypokalemia  10/09/2019  . Vaccine counseling 12/24/2019  . Menopausal vaginal dryness 12/24/2019  . Chronic rhinitis 01/31/2020  . Chronic respiratory failure with hypoxia (HCC) 01/31/2020  . Weight loss 02/19/2020  . Macrocytic anemia 03/10/2020  . Elevated ferritin 03/10/2020  . Irritable bowel syndrome with diarrhea 03/10/2020  . Anxiety about health 03/23/2020  . Pernicious anemia 03/23/2020  . Malnutrition of mild degree (HCC) 05/06/2020  . Body mass index (BMI) of 19 or less in adult 05/08/2020  . Drug-induced constipation 07/13/2020  . Lower extremity edema 07/29/2020  . Clostridium difficile colitis 09/11/2020   Resolved Ambulatory Problems    Diagnosis Date Noted  . Hypothyroid   . Acute renal failure (HCC) 12/19/2016  . Decreased hemoglobin 12/20/2016  . Acute kidney injury (HCC) 03/17/2017  . Sinobronchitis 06/16/2020   Past Medical History:  Diagnosis Date  . Acid reflux   . Anxiety   . High blood pressure    Reviewed med, allergy, problem list.   Observations/Objective: No acute distress Normal mood No labored breathing Dry to productive covid  .[name] Today's Vitals   10/09/20 1417  Temp: 98.6 F (37 C)  TempSrc: Oral  SpO2: 94%   There is no height or weight on file to calculate BMI.     Assessment and Plan: [name] KitchenMarland KitchenDiagnoses and all orders for this visit:  COPD exacerbation (HCC) -     predniSONE (DELTASONE) 20 MG tablet; Take 1 tablet (20 mg total) by mouth 2 (two) times daily with a meal. For 5 days.  Chronic rhinitis -     azelastine (ASTELIN) 0.1 % nasal spray; Place 2 sprays  into both nostrils 2 (two) times daily. Use in each nostril as directed   Given prednisone taper. Continue  Nebulizer and inhalers. Use O2 as needed. Hold off on any abx due to recent c.diff. use astelin nasal spray for nasal congestion/rhinorrhea.    Follow Up Instructions:    I discussed the assessment and treatment plan with the patient. The patient was provided an opportunity  to ask questions and all were answered. The patient agreed with the plan and demonstrated an understanding of the instructions.   The patient was advised to call back or seek an in-person evaluation if the symptoms worsen or if the condition fails to improve as anticipated.  I provided 30 minutes of non-face-to-face time during this encounter.   Tandy Gaw, PA-C

## 2020-10-12 ENCOUNTER — Other Ambulatory Visit: Payer: Self-pay | Admitting: Physician Assistant

## 2020-10-12 ENCOUNTER — Other Ambulatory Visit: Payer: Self-pay | Admitting: Nurse Practitioner

## 2020-10-12 DIAGNOSIS — J432 Centrilobular emphysema: Secondary | ICD-10-CM

## 2020-10-12 DIAGNOSIS — J441 Chronic obstructive pulmonary disease with (acute) exacerbation: Secondary | ICD-10-CM

## 2020-10-20 ENCOUNTER — Other Ambulatory Visit: Payer: Self-pay | Admitting: Physician Assistant

## 2020-10-22 ENCOUNTER — Telehealth: Payer: Self-pay | Admitting: Physician Assistant

## 2020-10-22 ENCOUNTER — Other Ambulatory Visit (HOSPITAL_COMMUNITY): Payer: Self-pay | Admitting: Psychiatry

## 2020-10-22 DIAGNOSIS — I10 Essential (primary) hypertension: Secondary | ICD-10-CM

## 2020-10-22 MED ORDER — ALBUTEROL SULFATE HFA 108 (90 BASE) MCG/ACT IN AERS
INHALATION_SPRAY | RESPIRATORY_TRACT | 0 refills | Status: DC
Start: 2020-10-22 — End: 2020-12-09

## 2020-10-22 NOTE — Telephone Encounter (Signed)
Sodium has been okay the last several checks.   Do we want to order lab? Please advise. Claire [name] not here today and patient wants drawn today.  90 day order of Proair was written at the end of October, insurance will probably not pay. RX sent.

## 2020-10-22 NOTE — Telephone Encounter (Signed)
Pt called. She believes her sodium is low and wants a lab order sent down today.  She is also asking for a refill on her Pro-Air(she lost a bottle when she was in the hospital).

## 2020-10-23 NOTE — Telephone Encounter (Signed)
Patient made aware.

## 2020-10-23 NOTE — Telephone Encounter (Signed)
  OK for BMP

## 2020-10-31 ENCOUNTER — Other Ambulatory Visit: Payer: Self-pay | Admitting: Physician Assistant

## 2020-10-31 DIAGNOSIS — E039 Hypothyroidism, unspecified: Secondary | ICD-10-CM

## 2020-10-31 DIAGNOSIS — F418 Other specified anxiety disorders: Secondary | ICD-10-CM

## 2020-11-02 ENCOUNTER — Encounter: Payer: Self-pay | Admitting: Physician Assistant

## 2020-11-02 ENCOUNTER — Ambulatory Visit (INDEPENDENT_AMBULATORY_CARE_PROVIDER_SITE_OTHER): Payer: Medicare Other | Admitting: Physician Assistant

## 2020-11-02 ENCOUNTER — Other Ambulatory Visit: Payer: Self-pay

## 2020-11-02 VITALS — BP 141/87 | HR 98 | Ht 63.0 in | Wt 99.0 lb

## 2020-11-02 DIAGNOSIS — J310 Chronic rhinitis: Secondary | ICD-10-CM

## 2020-11-02 DIAGNOSIS — J9611 Chronic respiratory failure with hypoxia: Secondary | ICD-10-CM

## 2020-11-02 DIAGNOSIS — E871 Hypo-osmolality and hyponatremia: Secondary | ICD-10-CM

## 2020-11-02 DIAGNOSIS — J432 Centrilobular emphysema: Secondary | ICD-10-CM

## 2020-11-02 DIAGNOSIS — R634 Abnormal weight loss: Secondary | ICD-10-CM

## 2020-11-02 MED ORDER — BREZTRI AEROSPHERE 160-9-4.8 MCG/ACT IN AERO: 1.0000 | INHALATION_SPRAY | Freq: Two times a day (BID) | RESPIRATORY_TRACT | 2 refills | Status: DC

## 2020-11-02 MED ORDER — IPRATROPIUM BROMIDE 0.06 % NA SOLN: 2.0000 | Freq: Four times a day (QID) | NASAL | 2 refills | Status: DC

## 2020-11-02 MED ORDER — METHYLPREDNISOLONE 4 MG PO TBPK: ORAL_TABLET | ORAL | 0 refills | Status: DC

## 2020-11-02 NOTE — Progress Notes (Signed)
Subjective:    Patient ID: Claire [name], female    DOB: [birth year], 69 y.o.   MRN: [id number]  HPI  Patient is a 69 year old female with COPD and chronic hypoxia, anemia of chronic disease, chronic rhinitis, chronic allergies, anxiety who presents to the clinic for follow-up.  Patient is doing okay.  She has recovered from C. difficile.  She last saw GI on 11/11.  He is continuing Pepcid twice a day.  Her weight is stable.  She has not gained or lost any weight in the last month.  She reports to be eating 2 full good meals a day.  She continues to take Bentyl for abdominal pain.  Her breathing is overall controlled.  She is on [place] daily.  She read an article about budesonide and interested in switching.  She continues to use her oxygen with ambulation and at bedtime.  She continues to have a dry cough and postnasal drip.  She used Astelin for a little bit and it did help but it makes her nose too dry.  She wonders if there is anything else to try.  She continues to take Zyrtec daily.  .. Active Ambulatory Problems    Diagnosis Date Noted  . PTSD (post-traumatic stress disorder) 11/10/2011  . Neurosis, anxiety, panic type 11/10/2011  . Acquired hypothyroidism 11/24/2014  . BP (high blood pressure) 07/24/2012  . Chronic use of benzodiazepine for therapeutic purpose 08/11/2015  . Hyponatremia 12/19/2016  . Hyperkalemia 12/19/2016  . Centrilobular emphysema (HCC) 12/20/2016  . Stage 3a chronic kidney disease (HCC) 01/16/2017  . Iron deficiency anemia 01/16/2017  . Anemia of chronic disease 05/11/2017  . Low serum vitamin B12 05/14/2017  . Vitamin D deficiency 05/14/2017  . Depressed mood 05/14/2017  . High serum parathyroid hormone (PTH) 07/19/2017  . Normocytic anemia 07/19/2017  . DOE (dyspnea on exertion) 05/09/2018  . Recurrent UTI 05/07/2019  . Leg cramps 05/29/2019  . No energy 05/29/2019  . History of hypokalemia 10/09/2019  . Vaccine counseling 12/24/2019  .  Menopausal vaginal dryness 12/24/2019  . Chronic rhinitis 01/31/2020  . Chronic respiratory failure with hypoxia (HCC) 01/31/2020  . Weight loss 02/19/2020  . Macrocytic anemia 03/10/2020  . Elevated ferritin 03/10/2020  . Irritable bowel syndrome with diarrhea 03/10/2020  . Anxiety about health 03/23/2020  . Pernicious anemia 03/23/2020  . Malnutrition of mild degree (HCC) 05/06/2020  . Body mass index (BMI) of 19 or less in adult 05/08/2020  . Drug-induced constipation 07/13/2020  . Lower extremity edema 07/29/2020  . Clostridium difficile colitis 09/11/2020   Resolved Ambulatory Problems    Diagnosis Date Noted  . Hypothyroid   . Acute renal failure (HCC) 12/19/2016  . Decreased hemoglobin 12/20/2016  . Acute kidney injury (HCC) 03/17/2017  . Sinobronchitis 06/16/2020   Past Medical History:  Diagnosis Date  . Acid reflux   . Anxiety   . High blood pressure        Review of Systems  All other systems reviewed and are negative.      Objective:   Physical Exam Vitals reviewed.  Constitutional:      Appearance: Normal appearance.     Comments: Frail appearance.   HENT:     Head: Normocephalic.     Right Ear: Tympanic membrane normal.     Left Ear: Tympanic membrane normal.     Nose: Rhinorrhea present.     Mouth/Throat:     Mouth: Mucous membranes are moist.  Eyes:  Conjunctiva/sclera: Conjunctivae normal.  Cardiovascular:     Rate and Rhythm: Normal rate and regular rhythm.     Pulses: Normal pulses.     Heart sounds: Normal heart sounds.  Pulmonary:     Effort: Pulmonary effort is normal.     Comments: Decreased breath sounds, bilateral lungs.  Neurological:     General: No focal deficit present.     Mental Status: She is alert and oriented to person, place, and time.  Psychiatric:        Mood and Affect: Mood normal.        Behavior: Behavior normal.           Assessment & Plan:  [name] KitchenMarland KitchenIrais was seen today for weight check and  hoarse.  Diagnoses and all orders for this visit:  Chronic rhinitis -     ipratropium (ATROVENT) 0.06 % nasal spray; Place 2 sprays into both nostrils 4 (four) times daily. -     methylPREDNISolone (MEDROL DOSEPAK) 4 MG TBPK tablet; Take as directed by package insert.  Centrilobular emphysema (HCC) -     methylPREDNISolone (MEDROL DOSEPAK) 4 MG TBPK tablet; Take as directed by package insert. -     Budeson-Glycopyrrol-Formoterol (BREZTRI AEROSPHERE) 160-9-4.8 MCG/ACT AERO; Inhale 1 puff into the lungs in the morning and at bedtime.  Chronic respiratory failure with hypoxia (HCC) -     methylPREDNISolone (MEDROL DOSEPAK) 4 MG TBPK tablet; Take as directed by package insert. -     Budeson-Glycopyrrol-Formoterol (BREZTRI AEROSPHERE) 160-9-4.8 MCG/ACT AERO; Inhale 1 puff into the lungs in the morning and at bedtime.  Hyponatremia -     BASIC METABOLIC PANEL WITH GFR  Weight loss   Reassured patient that her lungs sound great today.  Her pulse ox on 2 L of oxygen was 100%.  Encourage continual use with ambulation and bedtime of oxygen. Chronic rhinitis is likely causing her sinus nasal drip and hoarseness and cough.  Start Atrovent to replace Astelin and see if it works better without creating as much dryness.  Continue Zyrtec daily.  Of course she can continue albuterol as needed for rescue. Patient has ongoing COPD.  I have tried to get her to switch inhalers for a while.  She has been resistant to this change until this article.  She would like to try the respiratory triple inhaler.  Stop Spiriva and Dulera and start twice a day.  Follow-up in 1 month.  Recheck sodium level today.   Patient's weight is stable.  Strongly encouraged more snacks and higher protein.  Reassuring that she is not losing weight.

## 2020-11-03 ENCOUNTER — Encounter: Payer: Self-pay | Admitting: Physician Assistant

## 2020-11-03 LAB — BASIC METABOLIC PANEL WITH GFR
BUN/Creatinine Ratio: 15 (calc) (ref 6–22)
BUN: 16 mg/dL (ref 7–25)
CO2: 35 mmol/L — ABNORMAL HIGH (ref 20–32)
Calcium: 9.4 mg/dL (ref 8.6–10.4)
Chloride: 96 mmol/L — ABNORMAL LOW (ref 98–110)
Creat: 1.09 mg/dL — ABNORMAL HIGH (ref 0.50–0.99)
GFR, Est African American: 60 mL/min/{1.73_m2} (ref >=60)
GFR, Est Non African American: 52 mL/min/{1.73_m2} — ABNORMAL LOW (ref >=60)
Glucose, Bld: 83 mg/dL (ref 65–99)
Potassium: 4.7 mmol/L (ref 3.5–5.3)
Sodium: 136 mmol/L (ref 135–146)

## 2020-11-03 NOTE — Progress Notes (Signed)
Claire [name],   Sodium is stable. Which is great news.  Your kidney function went back to where he has been for awhile. Im not sure what you were doing the month it was so good. Make sure drinking enough. Proper hydration is likely the culprit. When your kidney function was the best your BUN was 10 as your BUN goes up your creatinine rises and GFR decreases. Shoot for 64oz a day.

## 2020-11-06 ENCOUNTER — Ambulatory Visit (INDEPENDENT_AMBULATORY_CARE_PROVIDER_SITE_OTHER): Payer: Medicare Other | Admitting: Medical-Surgical

## 2020-11-06 ENCOUNTER — Other Ambulatory Visit: Payer: Self-pay

## 2020-11-06 ENCOUNTER — Encounter: Payer: Self-pay | Admitting: Medical-Surgical

## 2020-11-06 VITALS — BP 120/71 | HR 109 | Temp 98.3°F

## 2020-11-06 DIAGNOSIS — R300 Dysuria: Secondary | ICD-10-CM | POA: Diagnosis not present

## 2020-11-06 LAB — POCT URINALYSIS DIP (CLINITEK)
Bilirubin, UA: NEGATIVE
Blood, UA: NEGATIVE
Glucose, UA: NEGATIVE mg/dL
Ketones, POC UA: NEGATIVE mg/dL
Nitrite, UA: NEGATIVE
POC PROTEIN,UA: NEGATIVE
Spec Grav, UA: 1.015 (ref 1.010–1.025)
Urobilinogen, UA: 0.2 E.U./dL
pH, UA: 7 (ref 5.0–8.0)

## 2020-11-06 NOTE — Progress Notes (Signed)
Subjective:    CC: vaginal burning/dysuria  HPI: Pleasant 69 year old female presenting today with worries of a possible UTI.  She has been using Premarin cream as prescribed but notes that she does forget to apply it some days.  She has had intermittent burning in the vaginal/urethral area.  With her recent worry over her kidney function as well as struggles with C. difficile, she is worried that she may have a UTI.  Her worry is also involving the possibility of recurring C. difficile if she should have to be treated with an antibiotic.  Notes that she has been trying to drink more fluids during the day and often chooses to mix her drinks (soda with water, Gatorade with water, etc.).  Denies fever, chills, suprapubic pressure urinary odor/discoloration, urgency, and frequency.  I reviewed the past medical history, family history, social history, surgical history, and allergies today and no changes were needed.  Please see the problem list section below in epic for further details.  Past Medical History: Past Medical History:  Diagnosis Date  . Acid reflux   . Anxiety   . High blood pressure   . High serum parathyroid hormone (PTH) 07/19/2017   96 06/28/2014, 162 12/29/16  . Hypothyroid   . PTSD (post-traumatic stress disorder)    Past Surgical History: History reviewed. No pertinent surgical history. Social History: Social History   Socioeconomic History  . Marital status: Divorced    Spouse name: Not on file  . Number of children: 2  . Years of education: 49  . Highest education level: Associate degree: academic program  Occupational History  . 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 Smoking status: Current Some Day Smoker    Packs/day: 0.25    Years: 50.00    Pack years: 12.50    Types: Cigarettes  . Smokeless tobacco: Never Used  Vaping Use  . Vaping Use: Never used  Substance and Sexual Activity  . Alcohol use: No  . Drug use: No  .  Sexual activity: Not Currently  Other Topics Concern  . Not on file  Social History Narrative  . Not on file   Social Determinants of Health   Financial Resource Strain:   . Difficulty of Paying Living Expenses: Not on file  Food Insecurity:   . Worried About Programme researcher, broadcasting/film/video in the Last Year: Not on file  . Ran Out of Food in the Last Year: Not on file  Transportation Needs:   . Lack of Transportation (Medical): Not on file  . Lack of Transportation (Non-Medical): Not on file  Physical Activity:   . Days of Exercise per Week: Not on file  . Minutes of Exercise per Session: Not on file  Stress:   . Feeling of Stress : Not on file  Social Connections:   . Frequency of Communication with Friends and Family: Not on file  . Frequency of Social Gatherings with Friends and Family: Not on file  . Attends Religious Services: Not on file  . Active Member of Clubs or Organizations: Not on file  . Attends Banker Meetings: Not on file  . Marital Status: Not on file   Family History: Family History  Problem Relation Age of Onset  . Anxiety disorder Mother   . OCD Daughter   . Alcohol abuse Father   . Anxiety disorder Sister    Allergies: Allergies  Allergen Reactions  . Bactrim [Sulfamethoxazole-Trimethoprim]  ACUTE RENAL fAILURE/HYPONATREMIA/HYPERKALEMIA  . Macrodantin [Nitrofurantoin Macrocrystal] Rash  . Trimethoprim     Fatigue/weakness/no appetite  . Zithromax [Azithromycin]     diarrhea   Medications: See med rec.  Review of Systems: See HPI for pertinent positives and negatives.   Objective:    General: Well Developed, well nourished, and in no acute distress.  Neuro: Alert and oriented x3.  HEENT: Normocephalic, atraumatic.  Skin: Warm and dry. Cardiac: Regular rate and rhythm, no murmurs rubs or gallops, no lower extremity edema.  Respiratory: Clear to auscultation bilaterally. Not using accessory muscles, speaking in full  sentences.   Impression and Recommendations:    1. Dysuria POCT urinalysis positive for trace leukocytes but otherwise negative.  Sending sample for culture.  No antibiotics prescribed today.  Advised patient to continue hydrating well and try to remember to apply her Premarin cream as often as prescribed.  Patient verbalized understanding is agreeable to the plan. - POCT URINALYSIS DIP (CLINITEK) - Urine Culture  Return if symptoms worsen or fail to improve. ___________________________________________ Thayer Ohm, DNP, APRN, FNP-BC Primary Care and Sports Medicine [facility] [place]

## 2020-11-08 LAB — URINE CULTURE
MICRO NUMBER:: 11276437
SPECIMEN QUALITY:: ADEQUATE

## 2020-11-11 ENCOUNTER — Telehealth (INDEPENDENT_AMBULATORY_CARE_PROVIDER_SITE_OTHER): Payer: Medicare Other | Admitting: Physician Assistant

## 2020-11-11 DIAGNOSIS — J310 Chronic rhinitis: Secondary | ICD-10-CM

## 2020-11-11 DIAGNOSIS — J432 Centrilobular emphysema: Secondary | ICD-10-CM | POA: Diagnosis not present

## 2020-11-11 DIAGNOSIS — J0140 Acute pansinusitis, unspecified: Secondary | ICD-10-CM

## 2020-11-11 MED ORDER — PREDNISONE 50 MG PO TABS: ORAL_TABLET | ORAL | 0 refills | Status: DC

## 2020-11-11 NOTE — Progress Notes (Signed)
Patient ID: Claire [name], female   DOB: 02-01-1951, 69 y.o.   MRN: [id number] .[name] KitchenVirtual Visit via Telephone Note  I connected with Claire [name] on 11/11/2020 at  2:20 PM EST by telephone and verified that I am speaking with the correct person using two identifiers.  Location: Patient: home Provider: clinic  .[name] KitchenParticipating in visit:  Patient: Claire [name] Provider: Tandy Gaw PA-C Provider in training: Elizabeth Sauer PA-Student    I discussed the limitations, risks, security and privacy concerns of performing an evaluation and management service by telephone and the availability of in person appointments. I also discussed with the patient that there may be a patient responsible charge related to this service. The patient expressed understanding and agreed to proceed.   History of Present Illness: Patient is a 69 year old female with COPD, chronic anemia, anxiety who calls into the clinic to discuss some concern for medication changes.  Patient continues to be concerned about getting the Covid vaccine.  She still has more questions.  She never started the Medrol Dosepak.  It just was too confusing with all the different tablets.  She requests prednisone to be sent in to replace the Medrol Dosepak.  She feels more comfortable taking prednisone.  She continues to have a chronic and productive cough.  She has not switched to [employer]. She is still concerned about the switch. She has chronic rhinitis and claims Atrovent that helps a little.  .. Active Ambulatory Problems    Diagnosis Date Noted  . PTSD (post-traumatic stress disorder) 11/10/2011  . Neurosis, anxiety, panic type 11/10/2011  . Acquired hypothyroidism 11/24/2014  . BP (high blood pressure) 07/24/2012  . Chronic use of benzodiazepine for therapeutic purpose 08/11/2015  . Hyponatremia 12/19/2016  . Hyperkalemia 12/19/2016  . Centrilobular emphysema (HCC) 12/20/2016  . Stage 3a chronic kidney disease (HCC) 01/16/2017  .  Iron deficiency anemia 01/16/2017  . Anemia of chronic disease 05/11/2017  . Low serum vitamin B12 05/14/2017  . Vitamin D deficiency 05/14/2017  . Depressed mood 05/14/2017  . High serum parathyroid hormone (PTH) 07/19/2017  . Normocytic anemia 07/19/2017  . DOE (dyspnea on exertion) 05/09/2018  . Recurrent UTI 05/07/2019  . Leg cramps 05/29/2019  . No energy 05/29/2019  . History of hypokalemia 10/09/2019  . Vaccine counseling 12/24/2019  . Menopausal vaginal dryness 12/24/2019  . Chronic rhinitis 01/31/2020  . Chronic respiratory failure with hypoxia (HCC) 01/31/2020  . Weight loss 02/19/2020  . Macrocytic anemia 03/10/2020  . Elevated ferritin 03/10/2020  . Irritable bowel syndrome with diarrhea 03/10/2020  . Anxiety about health 03/23/2020  . Pernicious anemia 03/23/2020  . Malnutrition of mild degree (HCC) 05/06/2020  . Body mass index (BMI) of 19 or less in adult 05/08/2020  . Drug-induced constipation 07/13/2020  . Lower extremity edema 07/29/2020  . Clostridium difficile colitis 09/11/2020   Resolved Ambulatory Problems    Diagnosis Date Noted  . Hypothyroid   . Acute renal failure (HCC) 12/19/2016  . Decreased hemoglobin 12/20/2016  . Acute kidney injury (HCC) 03/17/2017  . Sinobronchitis 06/16/2020   Past Medical History:  Diagnosis Date  . Acid reflux   . Anxiety   . High blood pressure    Reviewed med, allergy, problem list.    Observations/Objective: No acute distress Productive cough but no labored breathing.   [name].There were no vitals filed for this visit. There is no height or weight on file to calculate BMI.   Assessment and Plan: [name] KitchenMarland KitchenShatavia was seen today for advice only.  Diagnoses and all orders for this visit:  Chronic rhinitis -     predniSONE (DELTASONE) 50 MG tablet; One tab PO daily for 5 days.  Centrilobular emphysema (HCC) -     predniSONE (DELTASONE) 50 MG tablet; One tab PO daily for 5 days.   Strongly encouraged patient to get  vaccine. She will continue to consider. She has concerns and I addressed them.   Prednisone to replace medrol dose pack if needed. I cautioned use unless symptoms getting worse such as difficultly breathing, more congestion and sinus pressure.  For chronic rhinitis Continue using atrovent nasal spray and stay on anti-histamine claritin or zyrtec.   Follow Up Instructions:    I discussed the assessment and treatment plan with the patient. The patient was provided an opportunity to ask questions and all were answered. The patient agreed with the plan and demonstrated an understanding of the instructions.   The patient was advised to call back or seek an in-person evaluation if the symptoms worsen or if the condition fails to improve as anticipated.  I provided 20 minutes of non-face-to-face time during this encounter.   Tandy Gaw, PA-C

## 2020-11-13 ENCOUNTER — Encounter: Payer: Self-pay | Admitting: Physician Assistant

## 2020-11-16 ENCOUNTER — Other Ambulatory Visit: Payer: Self-pay | Admitting: Physician Assistant

## 2020-11-18 ENCOUNTER — Other Ambulatory Visit (HOSPITAL_COMMUNITY): Payer: Self-pay | Admitting: Psychiatry

## 2020-11-30 ENCOUNTER — Telehealth (INDEPENDENT_AMBULATORY_CARE_PROVIDER_SITE_OTHER): Payer: Medicare Other | Admitting: Physician Assistant

## 2020-11-30 ENCOUNTER — Encounter: Payer: Self-pay | Admitting: Physician Assistant

## 2020-11-30 VITALS — Ht 63.0 in | Wt 107.0 lb

## 2020-11-30 DIAGNOSIS — R634 Abnormal weight loss: Secondary | ICD-10-CM

## 2020-11-30 NOTE — Progress Notes (Signed)
..Virtual Visit via Telephone Note  I connected with Claire [name] on 11/30/20 at  3:00 PM EST by telephone and verified that I am speaking with the correct person using two identifiers.  Location: Patient: home Provider: clinic  .[name] KitchenParticipating in visit:  Patient: Claire [name] Provider: Tandy Gaw PA-C   I discussed the limitations, risks, security and privacy concerns of performing an evaluation and management service by telephone and the availability of in person appointments. I also discussed with the patient that there may be a patient responsible charge related to this service. The patient expressed understanding and agreed to proceed.   History of Present Illness: Pt is a 69 yo female who calls in to follow up on weight. She has done really well and gained 8lbs in last month. She has more of an appetite. She also does have more energy. No other issues.   Very anxious about covid vaccine. She is not willing to get vaccinated.    .. Active Ambulatory Problems    Diagnosis Date Noted   PTSD (post-traumatic stress disorder) 11/10/2011   Neurosis, anxiety, panic type 11/10/2011   Acquired hypothyroidism 11/24/2014   BP (high blood pressure) 07/24/2012   Chronic use of benzodiazepine for therapeutic purpose 08/11/2015   Hyponatremia 12/19/2016   Hyperkalemia 12/19/2016   Centrilobular emphysema (HCC) 12/20/2016   Stage 3a chronic kidney disease (HCC) 01/16/2017   Iron deficiency anemia 01/16/2017   Anemia of chronic disease 05/11/2017   Low serum vitamin B12 05/14/2017   Vitamin D deficiency 05/14/2017   Depressed mood 05/14/2017   High serum parathyroid hormone (PTH) 07/19/2017   Normocytic anemia 07/19/2017   DOE (dyspnea on exertion) 05/09/2018   Recurrent UTI 05/07/2019   Leg cramps 05/29/2019   No energy 05/29/2019   History of hypokalemia 10/09/2019   Vaccine counseling 12/24/2019   Menopausal vaginal dryness 12/24/2019   Chronic  rhinitis 01/31/2020   Chronic respiratory failure with hypoxia (HCC) 01/31/2020   Weight loss 02/19/2020   Macrocytic anemia 03/10/2020   Elevated ferritin 03/10/2020   Irritable bowel syndrome with diarrhea 03/10/2020   Anxiety about health 03/23/2020   Pernicious anemia 03/23/2020   Malnutrition of mild degree (HCC) 05/06/2020   Body mass index (BMI) of 19 or less in adult 05/08/2020   Drug-induced constipation 07/13/2020   Lower extremity edema 07/29/2020   Clostridium difficile colitis 09/11/2020   Resolved Ambulatory Problems    Diagnosis Date Noted   Hypothyroid    Acute renal failure (HCC) 12/19/2016   Decreased hemoglobin 12/20/2016   Acute kidney injury (HCC) 03/17/2017   Sinobronchitis 06/16/2020   Past Medical History:  Diagnosis Date   Acid reflux    Anxiety    High blood pressure    Reviewed med, allergy, problem list.      Observations/Objective: No acute distress Normal breathing without difficulty No coughing or wheezing  .[name] Today's Vitals   11/30/20 1436 11/30/20 1513  Weight: 105 lb (47.6 kg) 107 lb (48.5 kg)  Height: 5\' 3"  (1.6 m)    Body mass index is 18.95 kg/m.    Assessment and Plan: [name] KitchenShanea was seen today for weight check.  Diagnoses and all orders for this visit:  Unintended weight loss   Keep up the good work. Keep eating 3 meals a day. Encouraged covid vaccine and good practice to keep hands clean and mask on when in public.    Follow Up Instructions:    I discussed the assessment and treatment plan with the patient.  The patient was provided an opportunity to ask questions and all were answered. The patient agreed with the plan and demonstrated an understanding of the instructions.   The patient was advised to call back or seek an in-person evaluation if the symptoms worsen or if the condition fails to improve as anticipated.  I provided 15 minutes of non-face-to-face time during this encounter.   Tandy Gaw, PA-C

## 2020-12-09 ENCOUNTER — Other Ambulatory Visit: Payer: Self-pay | Admitting: Physician Assistant

## 2020-12-14 ENCOUNTER — Other Ambulatory Visit: Payer: Self-pay | Admitting: Physician Assistant

## 2020-12-14 DIAGNOSIS — K580 Irritable bowel syndrome with diarrhea: Secondary | ICD-10-CM

## 2020-12-17 ENCOUNTER — Telehealth (HOSPITAL_COMMUNITY): Payer: Self-pay | Admitting: Psychiatry

## 2020-12-17 ENCOUNTER — Other Ambulatory Visit (HOSPITAL_COMMUNITY): Payer: Self-pay | Admitting: Psychiatry

## 2020-12-17 NOTE — Telephone Encounter (Signed)
Pt needs refill on [facility] pharmacy

## 2020-12-17 NOTE — Telephone Encounter (Signed)
sent 

## 2020-12-23 ENCOUNTER — Other Ambulatory Visit: Payer: Self-pay | Admitting: Physician Assistant

## 2020-12-23 DIAGNOSIS — R300 Dysuria: Secondary | ICD-10-CM

## 2020-12-23 NOTE — Progress Notes (Signed)
Pt having dysuria ok for urine culture.

## 2020-12-24 ENCOUNTER — Telehealth (INDEPENDENT_AMBULATORY_CARE_PROVIDER_SITE_OTHER): Payer: Medicare Other | Admitting: Psychiatry

## 2020-12-24 ENCOUNTER — Encounter (HOSPITAL_COMMUNITY): Payer: Self-pay | Admitting: Psychiatry

## 2020-12-24 DIAGNOSIS — F411 Generalized anxiety disorder: Secondary | ICD-10-CM

## 2020-12-24 DIAGNOSIS — F4310 Post-traumatic stress disorder, unspecified: Secondary | ICD-10-CM | POA: Diagnosis not present

## 2020-12-24 DIAGNOSIS — F410 Panic disorder [episodic paroxysmal anxiety] without agoraphobia: Secondary | ICD-10-CM | POA: Diagnosis not present

## 2020-12-24 LAB — URINE CULTURE
MICRO NUMBER:: 11435237
SPECIMEN QUALITY:: ADEQUATE

## 2020-12-24 NOTE — Progress Notes (Signed)
Patient ID: Claire [name], female   DOB: [birth year], 70 y.o.   MRN: [id number]  [facility] [phone] 69 y.o.  12/24/2020 3:14 PM  Chief Complaint:  Anxiety follow up, med review  History of Present Illness:     Virtual Visit via Telephone Note  I connected with Claire BestPamela L Pappalardo on 12/24/20 at  3:00 PM EST by telephone and verified that I am speaking with the correct person using two identifiers.  Location: Patient: home  Provider: home office   I discussed the limitations, risks, security and privacy concerns of performing an evaluation and management service by telephone and the availability of in person appointments. I also discussed with the patient that there may be a patient responsible charge related to this service. The patient expressed understanding and agreed to proceed.     I discussed the assessment and treatment plan with the patient. The patient was provided an opportunity to ask questions and all were answered. The patient agreed with the plan and demonstrated an understanding of the instructions.   The patient was advised to call back or seek an in-person evaluation if the symptoms worsen or if the condition fails to improve as anticipated.  I provided 14 minutes of non-face-to-face time during this encounter.   Thresa RossNadeem Lydon Vansickle, MD     Doing fair , lives by herself in apartment, daughter checks on her Worries related of Niece and some family concernts Says cannot cut down klonopine to bid, takes tid  other meds SSRI were tried in past did not help    Modifying factor: breahting techniques. grandkids   PTSD is related to her past some flashbacks. Distraction helps  Depression manageable No side effects     Aggravating factors: past childhood No psychosis  Medical History; Past Medical History:  Diagnosis Date  . Acid reflux   . Anxiety   . High blood pressure   . High serum  parathyroid hormone (PTH) 07/19/2017   96 06/28/2014, 162 12/29/16  . Hypothyroid   . PTSD (post-traumatic stress disorder)     Allergies: Allergies  Allergen Reactions  . Bactrim [Sulfamethoxazole-Trimethoprim]     ACUTE RENAL fAILURE/HYPONATREMIA/HYPERKALEMIA  . Macrodantin [Nitrofurantoin Macrocrystal] Rash  . Trimethoprim     Fatigue/weakness/no appetite  . Zithromax [Azithromycin]     diarrhea    Medications: Outpatient Encounter Medications as of 12/24/2020  Medication Sig  . albuterol (PROAIR HFA) 108 (90 Base) MCG/ACT inhaler INHALE 1-2 PUFFS INTO THE LUNGS EVERY 4 (FOUR) HOURS AS NEEDED FOR WHEEZING OR SHORTNESS OF BREATH.  [name]. albuterol (PROVENTIL) (2.5 MG/3ML) 0.083% nebulizer solution 1 VIAL IN NEBULIZER EVERY 4 HOURS AS NEEDED WHEEZING OR FOR SHORTNESS OF BREATH  . AMBULATORY NON FORMULARY MEDICATION Continuous stationary and portable oxygen tanks for use with ambulation. DX: COPD with hypoxia.  [name]. atenolol (TENORMIN) 25 MG tablet Take 1 tablet (25 mg total) by mouth daily.  [name]. azelastine (ASTELIN) 0.1 % nasal spray Place 2 sprays into both nostrils 2 (two) times daily. Use in each nostril as directed  . Budeson-Glycopyrrol-Formoterol (BREZTRI AEROSPHERE) 160-9-4.8 MCG/ACT AERO Inhale 1 puff into the lungs in the morning and at bedtime.  . cetirizine (ZYRTEC) 10 MG chewable tablet Chew 10 mg by mouth daily.  . cholecalciferol (VITAMIN D) 25 MCG (1000 UT) tablet Take 1,000 Units by mouth daily.  . clonazePAM (KLONOPIN) 0.5 MG tablet TAKE ONE TABLET BY MOUTH THREE TIMES DAILY  . dicyclomine (BENTYL) 10  MG capsule Take 1 capsule (10 mg total) by mouth 3 (three) times daily before meals.  [name] escitalopram (LEXAPRO) 10 MG tablet Take 1/2 tablet for 7 days then increase to one tablet daily.  [name] estradiol (ESTRACE VAGINAL) 0.1 MG/GM vaginal cream Place 1 Applicatorful vaginally 3 (three) times a week. And one pea-size amt applied to urethral opening.  . famotidine (PEPCID) 20 MG tablet  TAKE 1 TABLET BY MOUTH TWICE A DAY (Patient taking differently: Take 20 mg by mouth daily as needed.)  . ferrous sulfate 325 (65 FE) MG EC tablet 1 tab PO TID every other day  . ipratropium (ATROVENT) 0.06 % nasal spray Place 2 sprays into both nostrils 4 (four) times daily.  . Lactobacillus (FLORAJEN ACIDOPHILUS) CAPS Take 1 capsule by mouth daily.  [name] levothyroxine (SYNTHROID) 75 MCG tablet TAKE 1 TABLET BY MOUTH EVERY DAY   No facility-administered encounter medications on file as of 12/24/2020.    Family History; Family History  Problem Relation Age of Onset  . Anxiety disorder Mother   . OCD Daughter   . Alcohol abuse Father   . Anxiety disorder Sister        Labs:  Recent Results (from the past 2160 hour(s))  COMPLETE METABOLIC PANEL WITH GFR     Status: Abnormal   Collection Time: 09/28/20  2:45 PM  Result Value Ref Range   Glucose, Bld 84 65 - 99 mg/dL    Comment: .            Fasting reference interval .    BUN 10 7 - 25 mg/dL   Creat 8.24 2.35 - 3.61 mg/dL    Comment: For patients >40 years of age, the reference limit for Creatinine is approximately 13% higher for people identified as African-American. .    GFR, Est Non African American 70 > OR = 60 mL/min/1.28m2   GFR, Est African American 81 > OR = 60 mL/min/1.69m2   BUN/Creatinine Ratio NOT APPLICABLE 6 - 22 (calc)   Sodium 137 135 - 146 mmol/L   Potassium 4.2 3.5 - 5.3 mmol/L   Chloride 95 (L) 98 - 110 mmol/L   CO2 37 (H) 20 - 32 mmol/L   Calcium 9.2 8.6 - 10.4 mg/dL   Total Protein 6.6 6.1 - 8.1 g/dL   Albumin 3.8 3.6 - 5.1 g/dL   Globulin 2.8 1.9 - 3.7 g/dL (calc)   AG Ratio 1.4 1.0 - 2.5 (calc)   Total Bilirubin 0.3 0.2 - 1.2 mg/dL   Alkaline phosphatase (APISO) 51 37 - 153 U/L   AST 13 10 - 35 U/L   ALT 6 6 - 29 U/L  CBC with Differential/Platelet     Status: Abnormal   Collection Time: 09/28/20  2:45 PM  Result Value Ref Range   WBC 6.1 3.8 - 10.8 Thousand/uL   RBC 2.63 (L) 3.80 - 5.10  Million/uL   Hemoglobin 8.7 (L) 11.7 - 15.5 g/dL   HCT 44.3 (L) 15.4 - 00.8 %   MCV 100.8 (H) 80.0 - 100.0 fL   MCH 33.1 (H) 27.0 - 33.0 pg   MCHC 32.8 32.0 - 36.0 g/dL   RDW 67.6 (L) 19.5 - 09.3 %   Platelets 273 140 - 400 Thousand/uL   MPV 9.3 7.5 - 12.5 fL   Neutro Abs 4,252 1,500 - 7,800 cells/uL   Lymphs Abs 1,049 850 - 3,900 cells/uL   Absolute Monocytes 586 200 - 950 cells/uL   Eosinophils Absolute 171 15 - 500  cells/uL   Basophils Absolute 43 0 - 200 cells/uL   Neutrophils Relative % 69.7 %   Total Lymphocyte 17.2 %   Monocytes Relative 9.6 %   Eosinophils Relative 2.8 %   Basophils Relative 0.7 %  CBC w/Diff/Platelet     Status: Abnormal   Collection Time: 10/05/20 12:56 PM  Result Value Ref Range   WBC 6.1 3.4 - 10.8 x10E3/uL   RBC 2.72 (LL) 3.77 - 5.28 x10E6/uL   Hemoglobin 9.0 (L) 11.1 - 15.9 g/dL   Hematocrit 28.3 (L) 15.1 - 46.6 %   MCV 99 (H) 79 - 97 fL   MCH 33.1 (H) 26.6 - 33.0 pg   MCHC 33.5 31.5 - 35.7 g/dL   RDW 76.1 (L) 60.7 - 37.1 %   Platelets 331 150 - 450 x10E3/uL   Neutrophils 64 Not Estab. %   Lymphs 23 Not Estab. %   Monocytes 9 Not Estab. %   Eos 3 Not Estab. %   Basos 1 Not Estab. %   Neutrophils Absolute 3.9 1.4 - 7.0 x10E3/uL   Lymphocytes Absolute 1.4 0.7 - 3.1 x10E3/uL   Monocytes Absolute 0.6 0.1 - 0.9 x10E3/uL   EOS (ABSOLUTE) 0.2 0.0 - 0.4 x10E3/uL   Basophils Absolute 0.1 0.0 - 0.2 x10E3/uL   Immature Granulocytes 0 Not Estab. %   Immature Grans (Abs) 0.0 0.0 - 0.1 x10E3/uL  BASIC METABOLIC PANEL WITH GFR     Status: Abnormal   Collection Time: 11/02/20  2:08 PM  Result Value Ref Range   Glucose, Bld 83 65 - 99 mg/dL    Comment: .            Fasting reference interval .    BUN 16 7 - 25 mg/dL   Creat 0.62 (H) 6.94 - 0.99 mg/dL    Comment: For patients >52 years of age, the reference limit for Creatinine is approximately 13% higher for people identified as African-American. .    GFR, Est Non African American 52 (L) > OR =  60 mL/min/1.27m2   GFR, Est African American 60 > OR = 60 mL/min/1.84m2   BUN/Creatinine Ratio 15 6 - 22 (calc)   Sodium 136 135 - 146 mmol/L   Potassium 4.7 3.5 - 5.3 mmol/L   Chloride 96 (L) 98 - 110 mmol/L   CO2 35 (H) 20 - 32 mmol/L   Calcium 9.4 8.6 - 10.4 mg/dL  Urine Culture     Status: None   Collection Time: 11/06/20  4:16 PM   Specimen: Urine  Result Value Ref Range   MICRO NUMBER: [id number]    SPECIMEN QUALITY: Adequate    Sample Source URINE    STATUS: FINAL    Result:      Less than 10,000 CFU/mL of single Gram positive organism isolated. No further testing will be performed. If clinically indicated, recollection using a method to minimize contamination, with prompt transfer to Urine Culture Transport Tube, is recommended.  POCT URINALYSIS DIP (CLINITEK)     Status: Abnormal   Collection Time: 11/06/20  5:42 PM  Result Value Ref Range   Color, UA light yellow (A) yellow   Clarity, UA clear clear   Glucose, UA negative negative mg/dL   Bilirubin, UA negative negative   Ketones, POC UA negative negative mg/dL   Spec Grav, UA 5.009 3.818 - 1.025   Blood, UA negative negative   pH, UA 7.0 5.0 - 8.0   POC PROTEIN,UA negative negative, trace   Urobilinogen, UA  0.2 0.2 or 1.0 E.U./dL   Nitrite, UA Negative Negative   Leukocytes, UA Trace (A) Negative      Mental Status Examination;   Psychiatric Specialty Exam: Physical Exam  Review of Systems  Cardiovascular: Negative for chest pain.  Psychiatric/Behavioral: Negative for depression and suicidal ideas.    There were no vitals taken for this visit.There is no height or weight on file to calculate BMI.  General Appearance:   Eye Contact::    Speech:  Slow  Volume:  Decreased  Mood: fair  Affect:  congruent  Thought Process:  Coherent and intact  Orientation:  Full (Time, Place, and Person)  Thought Content:  Rumination  Suicidal Thoughts:  No  Homicidal Thoughts:  No  Memory:  Immediate;   Fair Recent;    Fair  Judgement:  Fair  Insight:  Shallow  Psychomotor Activity:  Normal  Concentration:  Fair  Recall:  Fair  Akathisia:  Negative  Handed:  Right  AIMS (if indicated):     Assets:  Social Support Vocational/Educational  Sleep:      Prior documentation, copy reviewed   Assessment: Axis I: Panic disorder. Possible PTSD. Rule out generalized anxiety disorder. Nicotine dependence  Axis II: Deferred  Axis III:  Past Medical History:  Diagnosis Date  . Acid reflux   . Anxiety   . High blood pressure   . High serum parathyroid hormone (PTH) 07/19/2017   96 06/28/2014, 162 12/29/16  . Hypothyroid   . PTSD (post-traumatic stress disorder)     Axis IV: Psychosocial. History of trauma   Treatment Plan and Summary:  Anxiety and GAD: fair on klonopine, do recommend she reduce to bid but she doesn't want to and gets anxious PTSD:  Baseline, continue on distraction techniques Does not want to be on sSRi  But just klonopine   FU with providers in regard to medical comorbidies  Fu 46m.   Thresa Ross, MD 12/24/2020

## 2020-12-25 NOTE — Progress Notes (Signed)
No significant bacteria was detected on urine culture.

## 2020-12-31 ENCOUNTER — Other Ambulatory Visit: Payer: Self-pay | Admitting: Physician Assistant

## 2020-12-31 DIAGNOSIS — J310 Chronic rhinitis: Secondary | ICD-10-CM

## 2021-01-13 ENCOUNTER — Telehealth: Payer: Self-pay

## 2021-01-13 NOTE — Telephone Encounter (Signed)
Agree with plan 

## 2021-01-13 NOTE — Telephone Encounter (Signed)
[name] states she has chest pain. Advised to go to ED ASAP. She states she will go tomorrow. I advised again to go to the ED today ASAP.

## 2021-01-14 ENCOUNTER — Telehealth (HOSPITAL_COMMUNITY): Payer: Self-pay | Admitting: Psychiatry

## 2021-01-14 ENCOUNTER — Other Ambulatory Visit (HOSPITAL_COMMUNITY): Payer: Self-pay | Admitting: Psychiatry

## 2021-01-14 ENCOUNTER — Other Ambulatory Visit: Payer: Self-pay | Admitting: Physician Assistant

## 2021-01-14 DIAGNOSIS — J310 Chronic rhinitis: Secondary | ICD-10-CM

## 2021-01-14 NOTE — Telephone Encounter (Signed)
Pt needs refill on [facility] pharmacy

## 2021-01-14 NOTE — Telephone Encounter (Signed)
sent 

## 2021-01-15 ENCOUNTER — Other Ambulatory Visit: Payer: Self-pay

## 2021-01-15 DIAGNOSIS — E875 Hyperkalemia: Secondary | ICD-10-CM

## 2021-01-19 ENCOUNTER — Encounter: Payer: Self-pay | Admitting: Physician Assistant

## 2021-01-19 ENCOUNTER — Other Ambulatory Visit: Payer: Self-pay

## 2021-01-19 ENCOUNTER — Ambulatory Visit (INDEPENDENT_AMBULATORY_CARE_PROVIDER_SITE_OTHER): Payer: Medicare Other | Admitting: Physician Assistant

## 2021-01-19 VITALS — BP 125/68 | HR 97 | Ht 63.0 in | Wt 99.0 lb

## 2021-01-19 DIAGNOSIS — E441 Mild protein-calorie malnutrition: Secondary | ICD-10-CM | POA: Diagnosis not present

## 2021-01-19 DIAGNOSIS — K5900 Constipation, unspecified: Secondary | ICD-10-CM | POA: Diagnosis not present

## 2021-01-19 DIAGNOSIS — N1831 Chronic kidney disease, stage 3a: Secondary | ICD-10-CM

## 2021-01-19 DIAGNOSIS — D638 Anemia in other chronic diseases classified elsewhere: Secondary | ICD-10-CM

## 2021-01-19 DIAGNOSIS — R636 Underweight: Secondary | ICD-10-CM

## 2021-01-19 DIAGNOSIS — E875 Hyperkalemia: Secondary | ICD-10-CM | POA: Diagnosis not present

## 2021-01-19 DIAGNOSIS — J432 Centrilobular emphysema: Secondary | ICD-10-CM

## 2021-01-19 DIAGNOSIS — D508 Other iron deficiency anemias: Secondary | ICD-10-CM | POA: Diagnosis not present

## 2021-01-19 NOTE — Patient Instructions (Signed)
Bentyl could be causing your constipation to worsen. Please cut back.  Fairlife milk with PROTEIN.

## 2021-01-19 NOTE — Progress Notes (Deleted)
Fatigue Trouble moving bowels  Having daily BM's but having to strain, may occasional skip a day Taking colace occasionally but not using every day because doesn't want to "get addicted" Stress due to finding out her dog has cancer

## 2021-01-19 NOTE — Progress Notes (Signed)
Subjective:    Patient ID: Claire [name], female    DOB: 1951-10-18, 70 y.o.   MRN: [id number]  HPI  Patient is a 70 year old female with COPD with chronic respiratory failure, hypothyroidism, anemia of chronic disease, who is malnourished and presents to the clinic with trouble with bowel movements.  Patient has a fairly recent history of Clostridium difficile diarrhea that has fully resolved.  She historically has had more problems with diarrhea but now she is having trouble with constipation.  She does admit to be taking Bentyl around 8 tablets a day.  She is having daily bowel movements but they are hard and she has to strain.  She does take Colace and it seems to help but does not want to get addicted to Colace.  She denies any melena or hematochezia.  She does see Dr. Opal Sidles as her gastroenterologist.  She does admit to not having much of an appetite.  Struggles to eat more than 1 meal a day.  She has not liked meal replacement options in the past.  Her dog was recently diagnosed with cancer and is placed her in a lot of stress and anxiety.  She is very worried about the outcome of her dog.   .. Active Ambulatory Problems    Diagnosis Date Noted  . PTSD (post-traumatic stress disorder) 11/10/2011  . Neurosis, anxiety, panic type 11/10/2011  . Acquired hypothyroidism 11/24/2014  . BP (high blood pressure) 07/24/2012  . Chronic use of benzodiazepine for therapeutic purpose 08/11/2015  . Hyponatremia 12/19/2016  . Hyperkalemia 12/19/2016  . Centrilobular emphysema (HCC) 12/20/2016  . Stage 3a chronic kidney disease (HCC) 01/16/2017  . Iron deficiency anemia 01/16/2017  . Anemia of chronic disease 05/11/2017  . Low serum vitamin B12 05/14/2017  . Vitamin D deficiency 05/14/2017  . Depressed mood 05/14/2017  . High serum parathyroid hormone (PTH) 07/19/2017  . Normocytic anemia 07/19/2017  . DOE (dyspnea on exertion) 05/09/2018  . Recurrent UTI 05/07/2019  . Leg cramps 05/29/2019   . No energy 05/29/2019  . History of hypokalemia 10/09/2019  . Vaccine counseling 12/24/2019  . Menopausal vaginal dryness 12/24/2019  . Chronic rhinitis 01/31/2020  . Chronic respiratory failure with hypoxia (HCC) 01/31/2020  . Unintended weight loss 02/19/2020  . Macrocytic anemia 03/10/2020  . Elevated ferritin 03/10/2020  . Irritable bowel syndrome with diarrhea 03/10/2020  . Anxiety about health 03/23/2020  . Pernicious anemia 03/23/2020  . Malnutrition of mild degree (HCC) 05/06/2020  . Body mass index (BMI) of 19 or less in adult 05/08/2020  . Constipation 07/13/2020  . Lower extremity edema 07/29/2020  . Clostridium difficile colitis 09/11/2020  . Mild protein-calorie malnutrition (HCC) 01/19/2021  . Underweight 01/22/2021   Resolved Ambulatory Problems    Diagnosis Date Noted  . Hypothyroid   . Acute renal failure (HCC) 12/19/2016  . Decreased hemoglobin 12/20/2016  . Acute kidney injury (HCC) 03/17/2017  . Sinobronchitis 06/16/2020   Past Medical History:  Diagnosis Date  . Acid reflux   . Anxiety   . High blood pressure        Review of Systems See HPI.     Objective:   Physical Exam Vitals reviewed.  Constitutional:      Appearance: She is ill-appearing.     Comments: Thin and fragile appearance  HENT:     Head: Normocephalic.  Neck:     Vascular: No carotid bruit.  Cardiovascular:     Rate and Rhythm: Normal rate and regular rhythm.  Pulses: Normal pulses.     Heart sounds: Normal heart sounds.  Pulmonary:     Effort: Pulmonary effort is normal.     Breath sounds: No wheezing or rhonchi.     Comments: Decreased breath sounds bilaterally.  Abdominal:     General: Bowel sounds are normal.     Palpations: Abdomen is soft.  Musculoskeletal:     Right lower leg: No edema.     Left lower leg: No edema.  Lymphadenopathy:     Cervical: No cervical adenopathy.  Neurological:     General: No focal deficit present.     Mental Status: She is  alert and oriented to person, place, and time.  Psychiatric:     Comments: anxious           Assessment & Plan:  [name] KitchenMarland KitchenBeverlie was seen today for fatigue.  Diagnoses and all orders for this visit:  Constipation, unspecified constipation type  Mild protein-calorie malnutrition (HCC) -     COMPLETE METABOLIC PANEL WITH GFR -     CBC with Differential/Platelet  Hyperkalemia -     COMPLETE METABOLIC PANEL WITH GFR  Other iron deficiency anemia -     CBC with Differential/Platelet  Underweight -     COMPLETE METABOLIC PANEL WITH GFR -     CBC with Differential/Platelet  Centrilobular emphysema (HCC)  Stage 3a chronic kidney disease (HCC) -     COMPLETE METABOLIC PANEL WITH GFR  Anemia of chronic disease -     CBC with Differential/Platelet   COPD-stable on 2L of O2. No labored breathing.   Her constipation could be from taking bentyl so often. Discussed to cut back on this since her diarrhea is controlled. Also discussed her stools will decrease as her food intake decreases. Discussed calories and eating good meals every day. Consider FairLife chocolate milk with protein.   Repeat CBC and CMP to follow potassium, nutrition status and anemia.    Spent 30 minutes with patient discussing plan/labs/reassuring.

## 2021-01-20 LAB — CBC WITH DIFFERENTIAL/PLATELET
Absolute Monocytes: 631 cells/uL (ref 200–950)
Basophils Absolute: 52 cells/uL (ref 0–200)
Basophils Relative: 0.8 %
Eosinophils Absolute: 163 cells/uL (ref 15–500)
Eosinophils Relative: 2.5 %
HCT: 27.1 % — ABNORMAL LOW (ref 35.0–45.0)
Hemoglobin: 9.2 g/dL — ABNORMAL LOW (ref 11.7–15.5)
Lymphs Abs: 1138 cells/uL (ref 850–3900)
MCH: 33.6 pg — ABNORMAL HIGH (ref 27.0–33.0)
MCHC: 33.9 g/dL (ref 32.0–36.0)
MCV: 98.9 fL (ref 80.0–100.0)
MPV: 9.1 fL (ref 7.5–12.5)
Monocytes Relative: 9.7 %
Neutro Abs: 4518 cells/uL (ref 1500–7800)
Neutrophils Relative %: 69.5 %
Platelets: 333 10*3/uL (ref 140–400)
RBC: 2.74 10*6/uL — ABNORMAL LOW (ref 3.80–5.10)
RDW: 11 % (ref 11.0–15.0)
Total Lymphocyte: 17.5 %
WBC: 6.5 10*3/uL (ref 3.8–10.8)

## 2021-01-20 LAB — COMPLETE METABOLIC PANEL WITH GFR
AG Ratio: 1.6 (calc) (ref 1.0–2.5)
ALT: 5 U/L — ABNORMAL LOW (ref 6–29)
AST: 12 U/L (ref 10–35)
Albumin: 4.3 g/dL (ref 3.6–5.1)
Alkaline phosphatase (APISO): 70 U/L (ref 37–153)
BUN/Creatinine Ratio: 12 (calc) (ref 6–22)
BUN: 13 mg/dL (ref 7–25)
CO2: 37 mmol/L — ABNORMAL HIGH (ref 20–32)
Calcium: 9.5 mg/dL (ref 8.6–10.4)
Chloride: 97 mmol/L — ABNORMAL LOW (ref 98–110)
Creat: 1.11 mg/dL — ABNORMAL HIGH (ref 0.60–0.93)
GFR, Est African American: 58 mL/min/{1.73_m2} — ABNORMAL LOW (ref >=60)
GFR, Est Non African American: 50 mL/min/{1.73_m2} — ABNORMAL LOW (ref >=60)
Globulin: 2.7 g/dL (calc) (ref 1.9–3.7)
Glucose, Bld: 86 mg/dL (ref 65–99)
Potassium: 4.4 mmol/L (ref 3.5–5.3)
Sodium: 139 mmol/L (ref 135–146)
Total Bilirubin: 0.3 mg/dL (ref 0.2–1.2)
Total Protein: 7 g/dL (ref 6.1–8.1)

## 2021-01-20 NOTE — Progress Notes (Signed)
Claire [name],   RBC/Hemoglobin improved some. Kidney function stable but decreased just a hair.  Electrolytes great.

## 2021-01-22 ENCOUNTER — Encounter: Payer: Self-pay | Admitting: Physician Assistant

## 2021-01-22 DIAGNOSIS — R636 Underweight: Secondary | ICD-10-CM | POA: Insufficient documentation

## 2021-01-28 ENCOUNTER — Other Ambulatory Visit: Payer: Self-pay | Admitting: Physician Assistant

## 2021-01-28 DIAGNOSIS — J310 Chronic rhinitis: Secondary | ICD-10-CM

## 2021-01-29 ENCOUNTER — Other Ambulatory Visit: Payer: Self-pay | Admitting: Physician Assistant

## 2021-01-29 DIAGNOSIS — J432 Centrilobular emphysema: Secondary | ICD-10-CM

## 2021-02-11 ENCOUNTER — Other Ambulatory Visit: Payer: Self-pay | Admitting: Physician Assistant

## 2021-02-11 DIAGNOSIS — J310 Chronic rhinitis: Secondary | ICD-10-CM

## 2021-02-12 ENCOUNTER — Telehealth (INDEPENDENT_AMBULATORY_CARE_PROVIDER_SITE_OTHER): Payer: Medicare Other | Admitting: Physician Assistant

## 2021-02-12 ENCOUNTER — Encounter: Payer: Self-pay | Admitting: Physician Assistant

## 2021-02-12 VITALS — Ht 63.0 in | Wt 99.0 lb

## 2021-02-12 DIAGNOSIS — J432 Centrilobular emphysema: Secondary | ICD-10-CM

## 2021-02-12 DIAGNOSIS — Z7185 Encounter for immunization safety counseling: Secondary | ICD-10-CM

## 2021-02-12 MED ORDER — DULERA 100-5 MCG/ACT IN AERO: INHALATION_SPRAY | RESPIRATORY_TRACT | 0 refills | Status: DC

## 2021-02-12 MED ORDER — SPIRIVA RESPIMAT 2.5 MCG/ACT IN AERS: INHALATION_SPRAY | RESPIRATORY_TRACT | 0 refills | Status: DC

## 2021-02-12 NOTE — Progress Notes (Addendum)
..Virtual Visit via Telephone Note  I connected with Claire [name] on 02/12/21 at  8:10 AM EST by telephone and verified that I am speaking with the correct person using two identifiers.  Location: Patient: home Provider: clinic  .[name] KitchenParticipating in visit:  Patient: Claire [name] Provider: Tandy Gaw PA-C   I discussed the limitations, risks, security and privacy concerns of performing an evaluation and management service by telephone and the availability of in person appointments. I also discussed with the patient that there may be a patient responsible charge related to this service. The patient expressed understanding and agreed to proceed.   History of Present Illness: Patient is a 71 year old female with chronic respiratory failure on continuous O2, COPD, CKD, anxiety, hypothyroidism who presents to the clinic with questions.  She continues to contemplate getting the Covid vaccine or not.  She feels safer to go out and about since levels are low.  She wants to know about my reassurance in this matter.  She continues to take Spiriva and Dulera her old COPD therapy.  She has not tried the [employer].  She is scared to try this new medication.  She continues to smoke.  She is not willing to quit.  She does try to cut back. Her breathing is stable.   She is concerned with her up in kidney function.  She wonders when she should recheck this.  .. Active Ambulatory Problems    Diagnosis Date Noted  . PTSD (post-traumatic stress disorder) 11/10/2011  . Neurosis, anxiety, panic type 11/10/2011  . Acquired hypothyroidism 11/24/2014  . BP (high blood pressure) 07/24/2012  . Chronic use of benzodiazepine for therapeutic purpose 08/11/2015  . Hyponatremia 12/19/2016  . Hyperkalemia 12/19/2016  . Centrilobular emphysema (HCC) 12/20/2016  . Stage 3a chronic kidney disease (HCC) 01/16/2017  . Iron deficiency anemia 01/16/2017  . Anemia of chronic disease 05/11/2017  . Low serum vitamin B12  05/14/2017  . Vitamin D deficiency 05/14/2017  . Depressed mood 05/14/2017  . High serum parathyroid hormone (PTH) 07/19/2017  . Normocytic anemia 07/19/2017  . DOE (dyspnea on exertion) 05/09/2018  . Recurrent UTI 05/07/2019  . Leg cramps 05/29/2019  . No energy 05/29/2019  . History of hypokalemia 10/09/2019  . Vaccine counseling 12/24/2019  . Menopausal vaginal dryness 12/24/2019  . Chronic rhinitis 01/31/2020  . Chronic respiratory failure with hypoxia (HCC) 01/31/2020  . Unintended weight loss 02/19/2020  . Macrocytic anemia 03/10/2020  . Elevated ferritin 03/10/2020  . Irritable bowel syndrome with diarrhea 03/10/2020  . Anxiety about health 03/23/2020  . Pernicious anemia 03/23/2020  . Malnutrition of mild degree (HCC) 05/06/2020  . Body mass index (BMI) of 19 or less in adult 05/08/2020  . Constipation 07/13/2020  . Lower extremity edema 07/29/2020  . Clostridium difficile colitis 09/11/2020  . Mild protein-calorie malnutrition (HCC) 01/19/2021  . Underweight 01/22/2021   Resolved Ambulatory Problems    Diagnosis Date Noted  . Hypothyroid   . Acute renal failure (HCC) 12/19/2016  . Decreased hemoglobin 12/20/2016  . Acute kidney injury (HCC) 03/17/2017  . Sinobronchitis 06/16/2020   Past Medical History:  Diagnosis Date  . Acid reflux   . Anxiety   . High blood pressure    Reviewed med, allergy, problem list.     Observations/Objective: No acute distress Anxious about health No labored breathing Productive cough   Assessment and Plan: [name] KitchenMarland KitchenMarriah was seen today for follow-up.  Diagnoses and all orders for this visit:  Vaccine counseling  Centrilobular emphysema (HCC) -  Tiotropium Bromide Monohydrate (SPIRIVA RESPIMAT) 2.5 MCG/ACT AERS; TAKE 2 PUFFS ONCE A DAY. -     mometasone-formoterol (DULERA) 100-5 MCG/ACT AERO; TAKE 2 PUFFS BY MOUTH TWICE A DAY   Pt never started [employer]. Place spiriva and dulera back on list. On chronic O2. Stable  breathing.   She is not covid vaccinated. Discussed risk vs benefits. Answered questions patient had.  Encouraged immune support with  ..Vitamin D3 5000 IU (125 mcg) daily Vitamin C 500 mg twice daily Zinc 50 to 75 mg daily   Continues to smoke. Not willing to quit.   CKD-recheck bmp in may. TSH-recheck in may.   Follow Up Instructions:    I discussed the assessment and treatment plan with the patient. The patient was provided an opportunity to ask questions and all were answered. The patient agreed with the plan and demonstrated an understanding of the instructions.   The patient was advised to call back or seek an in-person evaluation if the symptoms worsen or if the condition fails to improve as anticipated.  I provided 20 minutes of non-face-to-face time during this encounter.   Tandy Gaw, PA-C

## 2021-02-17 ENCOUNTER — Telehealth: Payer: Self-pay | Admitting: Neurology

## 2021-02-17 ENCOUNTER — Other Ambulatory Visit (HOSPITAL_COMMUNITY): Payer: Self-pay | Admitting: Psychiatry

## 2021-02-17 NOTE — Telephone Encounter (Signed)
I would start with a few days of nasal irrigation and flonase along with zyrtec daily. I would wait to pull the steroid plug.

## 2021-02-17 NOTE — Telephone Encounter (Signed)
Claire [name] left vm stating she has noticed some crust in nose and increased drainage the last couple days, has been worse when she had her door open. She has an old Medrol dose pack from November 2021 and wondering if she could take this for symptoms.   Called patient back, she states only symptoms are drainage and occasional cough - productive (white/clear). Worse at night.    She is currently taking Zyrtec everyday. She states she does not take Mucinex due to dryness. Isn't using nasal sprays due to bleeding with them.  Please advise.

## 2021-02-18 NOTE — Telephone Encounter (Signed)
Patient made aware of recommendations.   

## 2021-02-19 ENCOUNTER — Ambulatory Visit (INDEPENDENT_AMBULATORY_CARE_PROVIDER_SITE_OTHER): Payer: Medicare Other | Admitting: Physician Assistant

## 2021-02-19 ENCOUNTER — Telehealth: Payer: Self-pay | Admitting: Physician Assistant

## 2021-02-19 ENCOUNTER — Other Ambulatory Visit: Payer: Self-pay

## 2021-02-19 ENCOUNTER — Telehealth: Payer: Self-pay | Admitting: Neurology

## 2021-02-19 VITALS — BP 129/59 | HR 63 | Ht 63.0 in | Wt 96.0 lb

## 2021-02-19 DIAGNOSIS — R5381 Other malaise: Secondary | ICD-10-CM

## 2021-02-19 DIAGNOSIS — R5383 Other fatigue: Secondary | ICD-10-CM

## 2021-02-19 DIAGNOSIS — R8290 Unspecified abnormal findings in urine: Secondary | ICD-10-CM | POA: Diagnosis not present

## 2021-02-19 DIAGNOSIS — N3001 Acute cystitis with hematuria: Secondary | ICD-10-CM | POA: Diagnosis not present

## 2021-02-19 DIAGNOSIS — R1084 Generalized abdominal pain: Secondary | ICD-10-CM

## 2021-02-19 DIAGNOSIS — R531 Weakness: Secondary | ICD-10-CM

## 2021-02-19 DIAGNOSIS — Z8619 Personal history of other infectious and parasitic diseases: Secondary | ICD-10-CM

## 2021-02-19 LAB — POCT URINALYSIS DIP (CLINITEK)
Bilirubin, UA: NEGATIVE
Glucose, UA: NEGATIVE mg/dL
Ketones, POC UA: NEGATIVE mg/dL
Nitrite, UA: POSITIVE — AB
Spec Grav, UA: 1.01 (ref 1.010–1.025)
Urobilinogen, UA: 0.2 E.U./dL
pH, UA: 6.5 (ref 5.0–8.0)

## 2021-02-19 MED ORDER — CEFTRIAXONE SODIUM 1 G IJ SOLR
1.0000 g | Freq: Once | INTRAMUSCULAR | Status: AC
  Administered 2021-02-19: 1 g via INTRAMUSCULAR

## 2021-02-19 MED ORDER — AMOXICILLIN-POT CLAVULANATE 500-125 MG PO TABS: 1.0000 | ORAL_TABLET | Freq: Two times a day (BID) | ORAL | 0 refills | Status: DC

## 2021-02-19 NOTE — Telephone Encounter (Signed)
Spoke with Misty Stanley with [facility] (Dr. Opal Sidles) and they state Rocephin is okay, if patient does start Augmentin, they will need to add Vancomycin. Called and let patient know. She will wait until Monday to see how she feels after Rocephin injection and we will decide whether she needs Augmentin. She expressed understanding. Will speak with her Monday.

## 2021-02-19 NOTE — Patient Instructions (Addendum)
Will give 1g of rocephin.   Recheck urine on Tuesday.    Urinary Tract Infection, Adult A urinary tract infection (UTI) is an infection of any part of the urinary tract. The urinary tract includes:  The kidneys.  The ureters.  The bladder.  The urethra. These organs make, store, and get rid of pee (urine) in the body. What are the causes? This infection is caused by germs (bacteria) in your genital area. These germs grow and cause swelling (inflammation) of your urinary tract. What increases the risk? The following factors may make you more likely to develop this condition:  Using a small, thin tube (catheter) to drain pee.  Not being able to control when you pee or poop (incontinence).  Being female. If you are female, these things can increase the risk: ? Using these methods to prevent pregnancy:  A medicine that kills sperm (spermicide).  A device that blocks sperm (diaphragm). ? Having low levels of a female hormone (estrogen). ? Being pregnant. You are more likely to develop this condition if:  You have genes that add to your risk.  You are sexually active.  You take antibiotic medicines.  You have trouble peeing because of: ? A prostate that is bigger than normal, if you are female. ? A blockage in the part of your body that drains pee from the bladder. ? A kidney stone. ? A nerve condition that affects your bladder. ? Not getting enough to drink. ? Not peeing often enough.  You have other conditions, such as: ? Diabetes. ? A weak disease-fighting system (immune system). ? Sickle cell disease. ? Gout. ? Injury of the spine. What are the signs or symptoms? Symptoms of this condition include:  Needing to pee right away.  Peeing small amounts often.  Pain or burning when peeing.  Blood in the pee.  Pee that smells bad or not like normal.  Trouble peeing.  Pee that is cloudy.  Fluid coming from the vagina, if you are female.  Pain in the  belly or lower back. Other symptoms include:  Vomiting.  Not feeling hungry.  Feeling mixed up (confused). This may be the first symptom in older adults.  Being tired and grouchy (irritable).  A fever.  Watery poop (diarrhea). How is this treated?  Taking antibiotic medicine.  Taking other medicines.  Drinking enough water. In some cases, you may need to see a specialist. Follow these instructions at home: Medicines  Take over-the-counter and prescription medicines only as told by your doctor.  If you were prescribed an antibiotic medicine, take it as told by your doctor. Do not stop taking it even if you start to feel better. General instructions  Make sure you: ? Pee until your bladder is empty. ? Do not hold pee for a long time. ? Empty your bladder after sex. ? Wipe from front to back after peeing or pooping if you are a female. Use each tissue one time when you wipe.  Drink enough fluid to keep your pee pale yellow.  Keep all follow-up visits.   Contact a doctor if:  You do not get better after 1-2 days.  Your symptoms go away and then come back. Get help right away if:  You have very bad back pain.  You have very bad pain in your lower belly.  You have a fever.  You have chills.  You feeling like you will vomit or you vomit. Summary  A urinary tract infection (UTI) is an  infection of any part of the urinary tract.  This condition is caused by germs in your genital area.  There are many risk factors for a UTI.  Treatment includes antibiotic medicines.  Drink enough fluid to keep your pee pale yellow. This information is not intended to replace advice given to you by your health care provider. Make sure you discuss any questions you have with your health care provider. Document Revised: 07/03/2020 Document Reviewed: 07/03/2020 Elsevier Patient Education  [place].

## 2021-02-19 NOTE — Progress Notes (Signed)
Subjective:    Patient ID: Claire [name], female    DOB: 09/19/1951, 70 y.o.   MRN: [id number]  HPI  Patient is a 70 year old underweight female with severe COPD, hypothyroidism, CKD 3, anemia of chronic disease, recurrent UTI's who presents to the clinic with urinary symptoms for the last 2 days. No fever, chills, flank pain. She is very tired and weak feeling. She does have some generalized lower abdominal discomfort. Denies any diarrhea. She is using the topical estogen cream for prevention which has helped a lot. She is concerned about antibiotic due to her history of c.diff after antibiotic usage.   .. Active Ambulatory Problems    Diagnosis Date Noted  . PTSD (post-traumatic stress disorder) 11/10/2011  . Neurosis, anxiety, panic type 11/10/2011  . Acquired hypothyroidism 11/24/2014  . BP (high blood pressure) 07/24/2012  . Chronic use of benzodiazepine for therapeutic purpose 08/11/2015  . Hyponatremia 12/19/2016  . Hyperkalemia 12/19/2016  . Centrilobular emphysema (HCC) 12/20/2016  . Stage 3a chronic kidney disease (HCC) 01/16/2017  . Iron deficiency anemia 01/16/2017  . Anemia of chronic disease 05/11/2017  . Low serum vitamin B12 05/14/2017  . Vitamin D deficiency 05/14/2017  . Depressed mood 05/14/2017  . High serum parathyroid hormone (PTH) 07/19/2017  . Normocytic anemia 07/19/2017  . DOE (dyspnea on exertion) 05/09/2018  . Recurrent UTI 05/07/2019  . Leg cramps 05/29/2019  . No energy 05/29/2019  . History of hypokalemia 10/09/2019  . Vaccine counseling 12/24/2019  . Menopausal vaginal dryness 12/24/2019  . Chronic rhinitis 01/31/2020  . Chronic respiratory failure with hypoxia (HCC) 01/31/2020  . Unintended weight loss 02/19/2020  . Macrocytic anemia 03/10/2020  . Elevated ferritin 03/10/2020  . Irritable bowel syndrome with diarrhea 03/10/2020  . Anxiety about health 03/23/2020  . Pernicious anemia 03/23/2020  . Malnutrition of mild degree (HCC) 05/06/2020   . Body mass index (BMI) of 19 or less in adult 05/08/2020  . Constipation 07/13/2020  . Lower extremity edema 07/29/2020  . Clostridium difficile colitis 09/11/2020  . Mild protein-calorie malnutrition (HCC) 01/19/2021  . Underweight 01/22/2021  . History of Clostridioides difficile colitis 02/22/2021   Resolved Ambulatory Problems    Diagnosis Date Noted  . Hypothyroid   . Acute renal failure (HCC) 12/19/2016  . Decreased hemoglobin 12/20/2016  . Acute kidney injury (HCC) 03/17/2017  . Sinobronchitis 06/16/2020   Past Medical History:  Diagnosis Date  . Acid reflux   . Anxiety   . High blood pressure      Review of Systems See HPI.     Objective:   Physical Exam Vitals reviewed.  Constitutional:      Comments: Frail and weak appearance.   HENT:     Head: Normocephalic.  Cardiovascular:     Rate and Rhythm: Normal rate and regular rhythm.  Pulmonary:     Effort: Pulmonary effort is normal.     Comments: On 2.5 L of O2.  Coarse breath sounds.  Abdominal:     General: Bowel sounds are normal. There is no distension.     Palpations: Abdomen is soft.     Tenderness: There is no abdominal tenderness. There is no right CVA tenderness, left CVA tenderness, guarding or rebound.  Musculoskeletal:     Cervical back: Normal range of motion.     Right lower leg: No edema.     Left lower leg: No edema.  Lymphadenopathy:     Cervical: No cervical adenopathy.  Neurological:     General:  No focal deficit present.     Mental Status: She is oriented to person, place, and time.  Psychiatric:     Comments: Anxious about health.       .. Results for orders placed or performed in visit on 02/19/21  Urine Culture   Specimen: Urine  Result Value Ref Range   MICRO NUMBER: [id number]    SPECIMEN QUALITY: Adequate    Sample Source NOT GIVEN    STATUS: FINAL    ISOLATE 1: Escherichia coli (A)       Susceptibility   Escherichia coli - URINE CULTURE, REFLEX     AMOX/CLAVULANIC 4 Sensitive     AMPICILLIN 4 Sensitive     AMPICILLIN/SULBACTAM <=2 Sensitive     CEFAZOLIN* <=4 Not Reportable      * For infections other than uncomplicated UTIcaused by E. coli, K. pneumoniae or P. mirabilis:Cefazolin is resistant if MIC > or = 8 mcg/mL.(Distinguishing susceptible versus intermediatefor isolates with MIC < or = 4 mcg/mL requiresadditional testing.)For uncomplicated UTI caused by E. coli,K. pneumoniae or P. mirabilis: Cefazolin issusceptible if MIC <32 mcg/mL and predictssusceptible to the oral agents cefaclor, cefdinir,cefpodoxime, cefprozil, cefuroxime, cephalexinand loracarbef.    CEFEPIME <=1 Sensitive     CEFTRIAXONE <=1 Sensitive     CIPROFLOXACIN >=4 Resistant     LEVOFLOXACIN >=8 Resistant     ERTAPENEM <=0.5 Sensitive     GENTAMICIN <=1 Sensitive     IMIPENEM <=0.25 Sensitive     NITROFURANTOIN <=16 Sensitive     PIP/TAZO <=4 Sensitive     TOBRAMYCIN <=1 Sensitive     TRIMETH/SULFA* <=20 Sensitive      * For infections other than uncomplicated UTIcaused by E. coli, K. pneumoniae or P. mirabilis:Cefazolin is resistant if MIC > or = 8 mcg/mL.(Distinguishing susceptible versus intermediatefor isolates with MIC < or = 4 mcg/mL requiresadditional testing.)For uncomplicated UTI caused by E. coli,K. pneumoniae or P. mirabilis: Cefazolin issusceptible if MIC <32 mcg/mL and predictssusceptible to the oral agents cefaclor, cefdinir,cefpodoxime, cefprozil, cefuroxime, cephalexinand loracarbef.Legend:S = Susceptible  I = IntermediateR = Resistant  NS = Not susceptible* = Not tested  NR = Not reported**NN = See antimicrobic comments  POCT URINALYSIS DIP (CLINITEK)  Result Value Ref Range   Color, UA yellow yellow   Clarity, UA cloudy (A) clear   Glucose, UA negative negative mg/dL   Bilirubin, UA negative negative   Ketones, POC UA negative negative mg/dL   Spec Grav, UA 0.539 7.673 - 1.025   Blood, UA moderate (A) negative   pH, UA 6.5 5.0 - 8.0   POC  PROTEIN,UA trace negative, trace   Urobilinogen, UA 0.2 0.2 or 1.0 E.U./dL   Nitrite, UA Positive (A) Negative   Leukocytes, UA Large (3+) (A) Negative       Assessment & Plan:  [name] KitchenMarland KitchenBrenisha was seen today for weakness.  Diagnoses and all orders for this visit:  Acute cystitis with hematuria -     amoxicillin-clavulanate (AUGMENTIN) 500-125 MG tablet; Take 1 tablet (500 mg total) by mouth in the morning and at bedtime. For 5 days. -     cefTRIAXone (ROCEPHIN) injection 1 g  Cloudy urine -     POCT URINALYSIS DIP (CLINITEK) -     Urine Culture -     cefTRIAXone (ROCEPHIN) injection 1 g  Weakness -     POCT URINALYSIS DIP (CLINITEK) -     Urine Culture  Generalized abdominal pain -     POCT URINALYSIS DIP (CLINITEK) -  Urine Culture  Malaise and fatigue  History of Clostridioides difficile colitis   Pt's dipstick and symptoms support acute cystitis. No signs of pyelonephritis. I would like to hold off on oral abx due to her hx of c.diff. called GI, Dr. Steffanie Dunn,  and stated ok not to give vancomycin with rocephin but if needed oral would have to give vancomycin. Will culture to confirm appropriate abx usage.  1g of rocephin given today. Pt concerned over the weekend about symptoms worsening. Gave rx for augmentin. Wait at least until Sunday but if cytitis symptoms worsening or not improving may start. Call on Monday with update. We could also bring in for another rocephin shot if symptoms not improving like suspected. Symptomatic care discussed with hydration.

## 2021-02-21 LAB — URINE CULTURE
MICRO NUMBER:: 11668007
SPECIMEN QUALITY:: ADEQUATE

## 2021-02-22 ENCOUNTER — Encounter: Payer: Self-pay | Admitting: Physician Assistant

## 2021-02-22 DIAGNOSIS — Z8619 Personal history of other infectious and parasitic diseases: Secondary | ICD-10-CM | POA: Insufficient documentation

## 2021-02-22 NOTE — Progress Notes (Signed)
How are you feeling? E.coli found in urine but sensitive to the shot we gave you on Friday?

## 2021-02-25 ENCOUNTER — Ambulatory Visit: Payer: Medicare Other | Admitting: Family Medicine

## 2021-02-25 ENCOUNTER — Other Ambulatory Visit: Payer: Self-pay | Admitting: Physician Assistant

## 2021-02-25 DIAGNOSIS — J310 Chronic rhinitis: Secondary | ICD-10-CM

## 2021-02-25 NOTE — Telephone Encounter (Signed)
Claire [name] not here, can you advise?

## 2021-02-26 ENCOUNTER — Telehealth: Payer: Self-pay | Admitting: Physician Assistant

## 2021-02-26 ENCOUNTER — Ambulatory Visit: Payer: Medicare Other | Admitting: Family Medicine

## 2021-02-26 MED ORDER — AZELASTINE HCL 0.15 % NA SOLN: 2.0000 | Freq: Every day | NASAL | 2 refills | Status: DC

## 2021-02-26 NOTE — Telephone Encounter (Signed)
Prescription sent for Astepro in place of Astelin.  But these products are now available over-the-counter which might be why they are not covering it but we can certainly try.  New prescription was sent.

## 2021-02-26 NOTE — Telephone Encounter (Signed)
Medication on backorder, not a coverage issue. It's not a DAW order so I think they would substitute generic if possible, is there an alternative medication or should I advise patient to go to a different pharmacy and purchase OTC?

## 2021-02-26 NOTE — Telephone Encounter (Signed)
Maybe the pharm can help her find OTC. There are only 2 choices in this class

## 2021-02-26 NOTE — Telephone Encounter (Signed)
Claire [name] has two other non acute spots open still for Monday, I went ahead and put patient at 11:10 am for 40 minutes, can you let her know?

## 2021-02-26 NOTE — Telephone Encounter (Signed)
Patient would like to come in for a UTI on Monday. We are showing only acute open and patient is a 40 min patient. Please advise.

## 2021-02-26 NOTE — Telephone Encounter (Signed)
Patient has been made aware. No further questions at this time.  

## 2021-03-01 ENCOUNTER — Encounter: Payer: Self-pay | Admitting: Physician Assistant

## 2021-03-01 ENCOUNTER — Other Ambulatory Visit: Payer: Self-pay

## 2021-03-01 ENCOUNTER — Ambulatory Visit (INDEPENDENT_AMBULATORY_CARE_PROVIDER_SITE_OTHER): Payer: Medicare Other | Admitting: Physician Assistant

## 2021-03-01 VITALS — BP 130/75 | HR 92 | Ht 63.0 in | Wt 96.0 lb

## 2021-03-01 DIAGNOSIS — N3000 Acute cystitis without hematuria: Secondary | ICD-10-CM

## 2021-03-01 DIAGNOSIS — J432 Centrilobular emphysema: Secondary | ICD-10-CM

## 2021-03-01 DIAGNOSIS — E441 Mild protein-calorie malnutrition: Secondary | ICD-10-CM

## 2021-03-01 DIAGNOSIS — R110 Nausea: Secondary | ICD-10-CM

## 2021-03-01 DIAGNOSIS — R1013 Epigastric pain: Secondary | ICD-10-CM

## 2021-03-01 LAB — POCT URINALYSIS DIP (CLINITEK)
Bilirubin, UA: NEGATIVE
Blood, UA: NEGATIVE
Glucose, UA: NEGATIVE mg/dL
Ketones, POC UA: NEGATIVE mg/dL
Nitrite, UA: NEGATIVE
POC PROTEIN,UA: NEGATIVE
Spec Grav, UA: 1.01 (ref 1.010–1.025)
Urobilinogen, UA: 0.2 E.U./dL
pH, UA: 5.5 (ref 5.0–8.0)

## 2021-03-01 MED ORDER — ALBUTEROL SULFATE HFA 108 (90 BASE) MCG/ACT IN AERS: INHALATION_SPRAY | RESPIRATORY_TRACT | 1 refills | Status: DC

## 2021-03-01 MED ORDER — PANTOPRAZOLE SODIUM 40 MG PO TBEC: 40.0000 mg | DELAYED_RELEASE_TABLET | Freq: Every day | ORAL | 3 refills | Status: DC

## 2021-03-01 NOTE — Patient Instructions (Signed)
Will call with urine culture results.  Sent albuterol inhaler.

## 2021-03-01 NOTE — Progress Notes (Signed)
Subjective:    Patient ID: Claire [name], female    DOB: 10/16/1951, 70 y.o.   MRN: [id number]   HPI  Pt is a 70 yo female with severe COPD with chronic respiratory failure, recurrent UTI, hx of clostridium difficle, IBS-D who presents to the clinic to follow up on UTI.   She finished augment and vancomycin for UTI. No urinary symptoms but now she has epigastric tenderness and nausea. No vomiting. She has had a few loose stools but no diarrhea. No fever, chills, body aches. Her appetite is decreased.   .. Active Ambulatory Problems    Diagnosis Date Noted  . PTSD (post-traumatic stress disorder) 11/10/2011  . Neurosis, anxiety, panic type 11/10/2011  . Acquired hypothyroidism 11/24/2014  . BP (high blood pressure) 07/24/2012  . Chronic use of benzodiazepine for therapeutic purpose 08/11/2015  . Hyponatremia 12/19/2016  . Hyperkalemia 12/19/2016  . Centrilobular emphysema (HCC) 12/20/2016  . Stage 3a chronic kidney disease (HCC) 01/16/2017  . Iron deficiency anemia 01/16/2017  . Anemia of chronic disease 05/11/2017  . Low serum vitamin B12 05/14/2017  . Vitamin D deficiency 05/14/2017  . Depressed mood 05/14/2017  . High serum parathyroid hormone (PTH) 07/19/2017  . Normocytic anemia 07/19/2017  . DOE (dyspnea on exertion) 05/09/2018  . Recurrent UTI 05/07/2019  . Leg cramps 05/29/2019  . No energy 05/29/2019  . History of hypokalemia 10/09/2019  . Vaccine counseling 12/24/2019  . Menopausal vaginal dryness 12/24/2019  . Chronic rhinitis 01/31/2020  . Chronic respiratory failure with hypoxia (HCC) 01/31/2020  . Unintended weight loss 02/19/2020  . Macrocytic anemia 03/10/2020  . Elevated ferritin 03/10/2020  . Irritable bowel syndrome with diarrhea 03/10/2020  . Anxiety about health 03/23/2020  . Pernicious anemia 03/23/2020  . Malnutrition of mild degree (HCC) 05/06/2020  . Body mass index (BMI) of 19 or less in adult 05/08/2020  . Constipation 07/13/2020  . Lower  extremity edema 07/29/2020  . Clostridium difficile colitis 09/11/2020  . Mild protein-calorie malnutrition (HCC) 01/19/2021  . Underweight 01/22/2021  . History of Clostridioides difficile colitis 02/22/2021   Resolved Ambulatory Problems    Diagnosis Date Noted  . Hypothyroid   . Acute renal failure (HCC) 12/19/2016  . Decreased hemoglobin 12/20/2016  . Acute kidney injury (HCC) 03/17/2017  . Sinobronchitis 06/16/2020   Past Medical History:  Diagnosis Date  . Acid reflux   . Anxiety   . High blood pressure       Review of Systems See HPI.     Objective:   Physical Exam Vitals reviewed.  Constitutional:      Comments: Frail appearance.   Cardiovascular:     Rate and Rhythm: Normal rate and regular rhythm.  Pulmonary:     Effort: Pulmonary effort is normal.     Comments: On 2.5L of O2. Coarse breath sounds.  Abdominal:     General: Bowel sounds are normal. There is no distension.     Palpations: Abdomen is soft. There is no mass.     Tenderness: There is abdominal tenderness. There is no right CVA tenderness, left CVA tenderness, guarding or rebound.     Comments: Epigastric tenderness.   Neurological:     General: No focal deficit present.     Mental Status: She is alert and oriented to person, place, and time.  Psychiatric:        Mood and Affect: Mood normal.       .. Results for orders placed or performed in visit on  03/01/21  POCT URINALYSIS DIP (CLINITEK)  Result Value Ref Range   Color, UA yellow yellow   Clarity, UA clear clear   Glucose, UA negative negative mg/dL   Bilirubin, UA negative negative   Ketones, POC UA negative negative mg/dL   Spec Grav, UA 3.335 4.562 - 1.025   Blood, UA negative negative   pH, UA 5.5 5.0 - 8.0   POC PROTEIN,UA negative negative, trace   Urobilinogen, UA 0.2 0.2 or 1.0 E.U./dL   Nitrite, UA Negative Negative   Leukocytes, UA Small (1+) (A) Negative       Assessment & Plan:  [name] KitchenMarland KitchenAvree was seen today for  urinary tract infection.  Diagnoses and all orders for this visit:  Acute cystitis without hematuria -     POCT URINALYSIS DIP (CLINITEK) -     Urine Culture  Epigastric pain -     pantoprazole (PROTONIX) 40 MG tablet; Take 1 tablet (40 mg total) by mouth daily.  Nausea -     pantoprazole (PROTONIX) 40 MG tablet; Take 1 tablet (40 mg total) by mouth daily.  Mild protein-calorie malnutrition (HCC)  Centrilobular emphysema (HCC) -     albuterol (PROAIR HFA) 108 (90 Base) MCG/ACT inhaler; INHALE 1-2 PUFFS INTO THE LUNGS EVERY 4 (FOUR) HOURS AS NEEDED FOR WHEEZING OR SHORTNESS OF BREATH.   UA improved. Leukocytes found. Will culture to confirm infection is cleared.  Discussed prevention. Continue premarin.   Epigastric tenderness and nausea. Likely more IBS-D flare after abx usage. Start protonix daily. Continue to eat small frequent meals. Call with any stools changes, vomiting, worsening ongoing pain.   Refilled albuterol for COPD.

## 2021-03-03 ENCOUNTER — Telehealth: Payer: Self-pay | Admitting: Neurology

## 2021-03-03 DIAGNOSIS — R531 Weakness: Secondary | ICD-10-CM

## 2021-03-03 DIAGNOSIS — R1084 Generalized abdominal pain: Secondary | ICD-10-CM

## 2021-03-03 LAB — URINE CULTURE
MICRO NUMBER:: 11703999
SPECIMEN QUALITY:: ADEQUATE

## 2021-03-03 NOTE — Telephone Encounter (Signed)
Patient still feeling "bad" and wants CBC, CMP drawn. Pended. Please sign if appropriate.

## 2021-03-03 NOTE — Progress Notes (Signed)
No significant bacteria growth. Infection is cleared.

## 2021-03-03 NOTE — Telephone Encounter (Signed)
Orders signed.

## 2021-03-03 NOTE — Telephone Encounter (Signed)
Patient advised.

## 2021-03-03 NOTE — Telephone Encounter (Signed)
Signed.

## 2021-03-04 LAB — CMP14+EGFR
ALT: 6 IU/L (ref 0–32)
AST: 17 IU/L (ref 0–40)
Albumin/Globulin Ratio: 1.1 — ABNORMAL LOW (ref 1.2–2.2)
Albumin: 3.8 g/dL (ref 3.8–4.8)
Alkaline Phosphatase: 85 IU/L (ref 44–121)
BUN/Creatinine Ratio: 8 — ABNORMAL LOW (ref 12–28)
BUN: 11 mg/dL (ref 8–27)
Bilirubin Total: <0.2 mg/dL (ref 0.0–1.2)
CO2: 26 mmol/L (ref 20–29)
Calcium: 9.2 mg/dL (ref 8.7–10.3)
Chloride: 95 mmol/L — ABNORMAL LOW (ref 96–106)
Creatinine, Ser: 1.32 mg/dL — ABNORMAL HIGH (ref 0.57–1.00)
Globulin, Total: 3.4 g/dL (ref 1.5–4.5)
Glucose: 105 mg/dL — ABNORMAL HIGH (ref 65–99)
Potassium: 4.4 mmol/L (ref 3.5–5.2)
Sodium: 141 mmol/L (ref 134–144)
Total Protein: 7.2 g/dL (ref 6.0–8.5)
eGFR: 43 mL/min/{1.73_m2} — ABNORMAL LOW (ref >59)

## 2021-03-04 LAB — CBC WITH DIFFERENTIAL/PLATELET
Basophils Absolute: 0.1 10*3/uL (ref 0.0–0.2)
Basos: 1 %
EOS (ABSOLUTE): 0.2 10*3/uL (ref 0.0–0.4)
Eos: 2 %
Hematocrit: 26 % — ABNORMAL LOW (ref 34.0–46.6)
Hemoglobin: 8.9 g/dL — ABNORMAL LOW (ref 11.1–15.9)
Immature Grans (Abs): 0.1 10*3/uL (ref 0.0–0.1)
Immature Granulocytes: 1 %
Lymphocytes Absolute: 1.6 10*3/uL (ref 0.7–3.1)
Lymphs: 18 %
MCH: 32.8 pg (ref 26.6–33.0)
MCHC: 34.2 g/dL (ref 31.5–35.7)
MCV: 96 fL (ref 79–97)
Monocytes Absolute: 0.7 10*3/uL (ref 0.1–0.9)
Monocytes: 8 %
Neutrophils Absolute: 6.4 10*3/uL (ref 1.4–7.0)
Neutrophils: 70 %
Platelets: 449 10*3/uL (ref 150–450)
RBC: 2.71 x10E6/uL — CL (ref 3.77–5.28)
RDW: 10.6 % — ABNORMAL LOW (ref 11.7–15.4)
WBC: 9 10*3/uL (ref 3.4–10.8)

## 2021-03-04 NOTE — Telephone Encounter (Signed)
Your RBC count is down some but not much from baseline. Hemoglobin is also down. I think it is reasonable to go to hematology and get a consult. Thoughts?  I really think you just might not be eating enough but your serum creatinine is elevated more. GFR still in stage 3 kidney disease but if you would like consult with nephrology I am more than happy to send you.

## 2021-03-05 ENCOUNTER — Telehealth: Payer: Self-pay

## 2021-03-05 ENCOUNTER — Ambulatory Visit: Payer: Medicare Other | Admitting: Physician Assistant

## 2021-03-05 DIAGNOSIS — D638 Anemia in other chronic diseases classified elsewhere: Secondary | ICD-10-CM

## 2021-03-05 DIAGNOSIS — R718 Other abnormality of red blood cells: Secondary | ICD-10-CM

## 2021-03-05 DIAGNOSIS — N1831 Chronic kidney disease, stage 3a: Secondary | ICD-10-CM

## 2021-03-05 NOTE — Telephone Encounter (Signed)
[name] called and said she is ok with the hematology referral. She prefers [place] or [facility] if possible.

## 2021-03-08 NOTE — Telephone Encounter (Signed)
Placed for [facility] Dr. Myna Hidalgo.

## 2021-03-10 ENCOUNTER — Telehealth: Payer: Self-pay | Admitting: *Deleted

## 2021-03-10 NOTE — Telephone Encounter (Signed)
Per referral 03/09/21 Dr. Caleen Essex - called patient to schedule appointment with Dr. Myna Hidalgo . Patient is refusing due to wanting a doctor in [place].

## 2021-03-15 ENCOUNTER — Other Ambulatory Visit (HOSPITAL_COMMUNITY): Payer: Self-pay | Admitting: Psychiatry

## 2021-03-15 ENCOUNTER — Telehealth (HOSPITAL_COMMUNITY): Payer: Self-pay | Admitting: Psychiatry

## 2021-03-15 ENCOUNTER — Encounter: Payer: Self-pay | Admitting: Physician Assistant

## 2021-03-15 ENCOUNTER — Telehealth (INDEPENDENT_AMBULATORY_CARE_PROVIDER_SITE_OTHER): Payer: Medicare Other | Admitting: Physician Assistant

## 2021-03-15 VITALS — Ht 63.0 in | Wt 96.0 lb

## 2021-03-15 DIAGNOSIS — J432 Centrilobular emphysema: Secondary | ICD-10-CM

## 2021-03-15 DIAGNOSIS — Z79899 Other long term (current) drug therapy: Secondary | ICD-10-CM | POA: Insufficient documentation

## 2021-03-15 DIAGNOSIS — N1832 Chronic kidney disease, stage 3b: Secondary | ICD-10-CM

## 2021-03-15 DIAGNOSIS — J9611 Chronic respiratory failure with hypoxia: Secondary | ICD-10-CM

## 2021-03-15 DIAGNOSIS — F418 Other specified anxiety disorders: Secondary | ICD-10-CM

## 2021-03-15 DIAGNOSIS — D638 Anemia in other chronic diseases classified elsewhere: Secondary | ICD-10-CM

## 2021-03-15 NOTE — Progress Notes (Signed)
Jomarie Longs, PA-C  03/04/2021 11:37 AM EDT      Your RBC count is down some but not much from baseline. Hemoglobin is also down. I think it is reasonable to go to hematology and get a consult. Thoughts?  I really think you just might not be eating enough but your serum creatinine is elevated more. GFR still in stage 3 kidney disease but if you would like consult with nephrology I am more than happy to send you.    Wants to discuss these referrals before agreeing to be sent Sent msg to [facility]

## 2021-03-15 NOTE — Telephone Encounter (Signed)
Per pt  Pt needs refill on klonopin  [facility]  

## 2021-03-16 ENCOUNTER — Telehealth (HOSPITAL_COMMUNITY): Payer: Self-pay | Admitting: Psychiatry

## 2021-03-16 ENCOUNTER — Encounter: Payer: Self-pay | Admitting: Physician Assistant

## 2021-03-16 MED ORDER — ALBUTEROL SULFATE HFA 108 (90 BASE) MCG/ACT IN AERS: INHALATION_SPRAY | RESPIRATORY_TRACT | 1 refills | Status: DC

## 2021-03-16 NOTE — Progress Notes (Signed)
Patient ID: Claire [name], female   DOB: 04-17-1951, 70 y.o.   MRN: [id number] .[name] KitchenVirtual Visit via Telephone Note  I connected with Claire [name] on 03/15/2021 at  2:00 PM EDT by telephone and verified that I am speaking with the correct person using two identifiers.  Location: Patient: home Provider: clinic  .[name] KitchenParticipating in visit:  Patient: Claire [name] Provider:Raynor Calcaterra Caleen Essex PA-C   I discussed the limitations, risks, security and privacy concerns of performing an evaluation and management service by telephone and the availability of in person appointments. I also discussed with the patient that there may be a patient responsible charge related to this service. The patient expressed understanding and agreed to proceed.   History of Present Illness: Patient is a 70 year old female who is underweight and has chronic respiratory failure with hypoxia, central lobar emphysema, anemia of chronic disease, CKD 3B, hypothyroidism, and history of recurrent UTI who presents to the clinic via telephone with concerns about her health.  She was given Protonix at last visit and questions if she should be given this with her chronic kidney disease.  She has not started it yet but she read the package insert and it said something about this.  COPD-her breathing is stable but she feels like Spiriva is not doing anything to help her breathe better.  She wants to try something else but is worried about side effects.  She has been given Breztri in the past to replace Dulera and Spiriva but never started.  She is on continuous oxygen 2 to 2.5 L.  Her last labs showed RBCs and hemoglobin down.  She admits she has not been taking her iron supplement.  She did not want to go to hematology in [facility].  She really does not even want a referral.  She wants to know my opinion on referral.  .. Active Ambulatory Problems    Diagnosis Date Noted  . PTSD (post-traumatic stress disorder) 11/10/2011  . Neurosis, anxiety,  panic type 11/10/2011  . Acquired hypothyroidism 11/24/2014  . BP (high blood pressure) 07/24/2012  . Chronic use of benzodiazepine for therapeutic purpose 08/11/2015  . Hyponatremia 12/19/2016  . Hyperkalemia 12/19/2016  . Centrilobular emphysema (HCC) 12/20/2016  . Stage 3a chronic kidney disease (HCC) 01/16/2017  . Iron deficiency anemia 01/16/2017  . Anemia of chronic disease 05/11/2017  . Low serum vitamin B12 05/14/2017  . Vitamin D deficiency 05/14/2017  . Depressed mood 05/14/2017  . High serum parathyroid hormone (PTH) 07/19/2017  . Normocytic anemia 07/19/2017  . DOE (dyspnea on exertion) 05/09/2018  . Recurrent UTI 05/07/2019  . Leg cramps 05/29/2019  . No energy 05/29/2019  . History of hypokalemia 10/09/2019  . Vaccine counseling 12/24/2019  . Menopausal vaginal dryness 12/24/2019  . Chronic rhinitis 01/31/2020  . Chronic respiratory failure with hypoxia (HCC) 01/31/2020  . Unintended weight loss 02/19/2020  . Macrocytic anemia 03/10/2020  . Elevated ferritin 03/10/2020  . Irritable bowel syndrome with diarrhea 03/10/2020  . Anxiety about health 03/23/2020  . Pernicious anemia 03/23/2020  . Malnutrition of mild degree (HCC) 05/06/2020  . Body mass index (BMI) of 19 or less in adult 05/08/2020  . Constipation 07/13/2020  . Lower extremity edema 07/29/2020  . Clostridium difficile colitis 09/11/2020  . Mild protein-calorie malnutrition (HCC) 01/19/2021  . Underweight 01/22/2021  . History of Clostridioides difficile colitis 02/22/2021  . Medication management 03/15/2021   Resolved Ambulatory Problems    Diagnosis Date Noted  . Hypothyroid   . Acute renal failure (  HCC) 12/19/2016  . Decreased hemoglobin 12/20/2016  . Acute kidney injury (HCC) 03/17/2017  . Sinobronchitis 06/16/2020   Past Medical History:  Diagnosis Date  . Acid reflux   . Anxiety   . High blood pressure    Reviewed med, allergy, problem list.      Observations/Objective: No acute  distress Anxious about health.  .. Today's Vitals   03/15/21 1118  Weight: 96 lb (43.5 kg)  Height: 5\' 3"  (1.6 m)   Body mass index is 17.01 kg/m.    Assessment and Plan: [name] KitchenToshua was seen today for follow-up.  Diagnoses and all orders for this visit:  Anxiety about health  Stage 3b chronic kidney disease (HCC) -     Ambulatory referral to Hematology  Anemia of chronic disease -     Ambulatory referral to Hematology  Chronic respiratory failure with hypoxia [facility])  Medication management   Sent message to pharmacist, [facility] here in office.  She agreed Protonix was safe to use in CKD 3B.  If her reflux is worsening please use Protonix.  If not she can transition back to Pepcid along.  Continue to make GERD diet changes.  COPD-overall she appears stable.  Reassured her that a trial of other medications is the only way we are going to see if they work better.  Continue on continuous oxygen.  I would like for her to try Breztri to replace Dulera and Spiriva.  Cautioned against just stopping Spiriva even though she thinks it does not help.  Could certainly cause a COPD exacerbation. Pt continues to smoke "some". Not willing to quit completely. Discussed pulmonology referral. Pt declined.   I went ahead and made another hematology referral for COVID local [place].  I do think establishing this visit will help answer questions.  Discussed with patient and if not only her access to iron this causing this anemia it is the inflammation in her kidneys not making it.  At times there are some shots and interventions that can help with this.  I do encourage her to go to this visit and have a discussion.   Recheck kidneys in 2 months.    Follow Up Instructions:    I discussed the assessment and treatment plan with the patient. The patient was provided an opportunity to ask questions and all were answered. The patient agreed with the plan and demonstrated an understanding of the  instructions.   The patient was advised to call back or seek an in-person evaluation if the symptoms worsen or if the condition fails to improve as anticipated.  I provided 20 minutes of non-face-to-face time during this encounter.   Lawernce Pitts, PA-C

## 2021-03-16 NOTE — Telephone Encounter (Signed)
Per pt  Pt needs refill on [facility] pharmacy

## 2021-03-16 NOTE — Telephone Encounter (Signed)
sent 

## 2021-03-16 NOTE — Addendum Note (Signed)
Addended by: Chalmers Cater on: 03/16/2021 08:08 AM   Modules accepted: Orders

## 2021-03-24 ENCOUNTER — Ambulatory Visit: Payer: Medicare Other | Admitting: Hematology & Oncology

## 2021-03-24 ENCOUNTER — Other Ambulatory Visit: Payer: Medicare Other

## 2021-03-25 ENCOUNTER — Encounter (HOSPITAL_COMMUNITY): Payer: Self-pay | Admitting: Psychiatry

## 2021-03-25 ENCOUNTER — Other Ambulatory Visit: Payer: Self-pay

## 2021-03-25 ENCOUNTER — Telehealth (INDEPENDENT_AMBULATORY_CARE_PROVIDER_SITE_OTHER): Payer: Medicare Other | Admitting: Psychiatry

## 2021-03-25 DIAGNOSIS — F4310 Post-traumatic stress disorder, unspecified: Secondary | ICD-10-CM

## 2021-03-25 DIAGNOSIS — F411 Generalized anxiety disorder: Secondary | ICD-10-CM

## 2021-03-25 NOTE — Progress Notes (Signed)
Patient ID: Claire [name], female   DOB: 07/06/1951, 70 y.o.   MRN: [id number]  [facility] visit [address] [phone] 70 y.o.  03/25/2021 3:12 PM  Chief Complaint:  Anxiety follow up, med review  History of Present Illness:   Virtual Visit via Telephone Note  I connected with Claire [name] on 03/25/21 at  3:00 PM EDT by telephone and verified that I am speaking with the correct person using two identifiers.  Location: Patient: home Provider: office   I discussed the limitations, risks, security and privacy concerns of performing an evaluation and management service by telephone and the availability of in person appointments. I also discussed with the patient that there may be a patient responsible charge related to this service. The patient expressed understanding and agreed to proceed.     I discussed the assessment and treatment plan with the patient. The patient was provided an opportunity to ask questions and all were answered. The patient agreed with the plan and demonstrated an understanding of the instructions.   The patient was advised to call back or seek an in-person evaluation if the symptoms worsen or if the condition fails to improve as anticipated.  I provided 15  minutes of non-face-to-face time during this encounter.   HPI  Patient continues to do reasonable and regarding her anxiety and she is living by her self in another apartment daughter is supportive she sometimes visits her.  Primary care started on small dose of Lexapro she is not taking it regularly we discussed to take it regularly to help with anxiety and PTSD symptoms   Overall manageable on baseline  Modifying factor: breahting techniques.  Grandkids  PTSD is related to her past some flashbacks. Distraction helps  Depression manageable No side effects     Aggravating factors: past childhood No psychosis  Medical History; Past Medical  History:  Diagnosis Date  . Acid reflux   . Anxiety   . High blood pressure   . High serum parathyroid hormone (PTH) 07/19/2017   96 06/28/2014, 162 12/29/16  . Hypothyroid   . PTSD (post-traumatic stress disorder)     Allergies: Allergies  Allergen Reactions  . Bactrim [Sulfamethoxazole-Trimethoprim]     ACUTE RENAL fAILURE/HYPONATREMIA/HYPERKALEMIA  . Macrodantin [Nitrofurantoin Macrocrystal] Rash  . Trimethoprim     Fatigue/weakness/no appetite  . Zithromax [Azithromycin]     diarrhea    Medications: Outpatient Encounter Medications as of 03/25/2021  Medication Sig  . albuterol (PROAIR HFA) 108 (90 Base) MCG/ACT inhaler INHALE 1-2 PUFFS INTO THE LUNGS EVERY 4 (FOUR) HOURS AS NEEDED FOR WHEEZING OR SHORTNESS OF BREATH.  [name] albuterol (PROVENTIL) (2.5 MG/3ML) 0.083% nebulizer solution 1 VIAL IN NEBULIZER EVERY 4 HOURS AS NEEDED WHEEZING OR FOR SHORTNESS OF BREATH  . AMBULATORY NON FORMULARY MEDICATION Continuous stationary and portable oxygen tanks for use with ambulation. DX: COPD with hypoxia.  [name] atenolol (TENORMIN) 25 MG tablet Take 1 tablet (25 mg total) by mouth daily.  . Azelastine HCl (ASTEPRO) 0.15 % SOLN Place 2 sprays into the nose daily.  . cetirizine (ZYRTEC) 10 MG chewable tablet Chew 10 mg by mouth daily.  . cholecalciferol (VITAMIN D) 25 MCG (1000 UT) tablet Take 1,000 Units by mouth daily.  . clonazePAM (KLONOPIN) 0.5 MG tablet TAKE ONE TABLET BY MOUTH THREE TIMES DAILY  . dicyclomine (BENTYL) 10 MG capsule Take 1 capsule (10 mg total) by mouth 3 (three) times daily before meals.  [name] escitalopram (  LEXAPRO) 10 MG tablet Take 1/2 tablet for 7 days then increase to one tablet daily.  [name] estradiol (ESTRACE VAGINAL) 0.1 MG/GM vaginal cream Place 1 Applicatorful vaginally 3 (three) times a week. And one pea-size amt applied to urethral opening.  . famotidine (PEPCID) 20 MG tablet TAKE 1 TABLET BY MOUTH TWICE A DAY (Patient taking differently: Take 20 mg by mouth daily as  needed.)  . ferrous sulfate 325 (65 FE) MG EC tablet 1 tab PO TID every other day  . Lactobacillus (FLORAJEN ACIDOPHILUS) CAPS Take 1 capsule by mouth daily.  [name] levothyroxine (SYNTHROID) 75 MCG tablet TAKE 1 TABLET BY MOUTH EVERY DAY  . mometasone-formoterol (DULERA) 100-5 MCG/ACT AERO TAKE 2 PUFFS BY MOUTH TWICE A DAY  . pantoprazole (PROTONIX) 40 MG tablet Take 1 tablet (40 mg total) by mouth daily.  . Tiotropium Bromide Monohydrate (SPIRIVA RESPIMAT) 2.5 MCG/ACT AERS TAKE 2 PUFFS ONCE A DAY.   No facility-administered encounter medications on file as of 03/25/2021.    Family History; Family History  Problem Relation Age of Onset  . Anxiety disorder Mother   . OCD Daughter   . Alcohol abuse Father   . Anxiety disorder Sister        Labs:  Recent Results (from the past 2160 hour(s))  COMPLETE METABOLIC PANEL WITH GFR     Status: Abnormal   Collection Time: 01/19/21 12:00 AM  Result Value Ref Range   Glucose, Bld 86 65 - 99 mg/dL    Comment: .            Fasting reference interval .    BUN 13 7 - 25 mg/dL   Creat 1.11 (H) 0.60 - 0.93 mg/dL    Comment: For patients >41 years of age, the reference limit for Creatinine is approximately 13% higher for people identified as African-American. .    GFR, Est Non African American 50 (L) > OR = 60 mL/min/1.67m   GFR, Est African American 58 (L) > OR = 60 mL/min/1.774m  BUN/Creatinine Ratio 12 6 - 22 (calc)   Sodium 139 135 - 146 mmol/L   Potassium 4.4 3.5 - 5.3 mmol/L   Chloride 97 (L) 98 - 110 mmol/L   CO2 37 (H) 20 - 32 mmol/L   Calcium 9.5 8.6 - 10.4 mg/dL   Total Protein 7.0 6.1 - 8.1 g/dL   Albumin 4.3 3.6 - 5.1 g/dL   Globulin 2.7 1.9 - 3.7 g/dL (calc)   AG Ratio 1.6 1.0 - 2.5 (calc)   Total Bilirubin 0.3 0.2 - 1.2 mg/dL   Alkaline phosphatase (APISO) 70 37 - 153 U/L   AST 12 10 - 35 U/L   ALT 5 (L) 6 - 29 U/L  CBC with Differential/Platelet     Status: Abnormal   Collection Time: 01/19/21 12:00 AM  Result Value  Ref Range   WBC 6.5 3.8 - 10.8 Thousand/uL   RBC 2.74 (L) 3.80 - 5.10 Million/uL   Hemoglobin 9.2 (L) 11.7 - 15.5 g/dL   HCT 27.1 (L) 35.0 - 45.0 %   MCV 98.9 80.0 - 100.0 fL   MCH 33.6 (H) 27.0 - 33.0 pg   MCHC 33.9 32.0 - 36.0 g/dL   RDW 11.0 11.0 - 15.0 %   Platelets 333 140 - 400 Thousand/uL   MPV 9.1 7.5 - 12.5 fL   Neutro Abs 4,518 1,500 - 7,800 cells/uL   Lymphs Abs 1,138 850 - 3,900 cells/uL   Absolute Monocytes 631 200 - 950  cells/uL   Eosinophils Absolute 163 15 - 500 cells/uL   Basophils Absolute 52 0 - 200 cells/uL   Neutrophils Relative % 69.5 %   Total Lymphocyte 17.5 %   Monocytes Relative 9.7 %   Eosinophils Relative 2.5 %   Basophils Relative 0.8 %  Urine Culture     Status: Abnormal   Collection Time: 02/19/21  1:31 PM   Specimen: Urine  Result Value Ref Range   MICRO NUMBER: [id number]    SPECIMEN QUALITY: Adequate    Sample Source NOT GIVEN    STATUS: FINAL    ISOLATE 1: Escherichia coli (A)     Comment: Greater than 100,000 CFU/mL of Escherichia coli      Susceptibility   Escherichia coli - URINE CULTURE, REFLEX    AMOX/CLAVULANIC 4 Sensitive     AMPICILLIN 4 Sensitive     AMPICILLIN/SULBACTAM <=2 Sensitive     CEFAZOLIN* <=4 Not Reportable      * For infections other than uncomplicated UTIcaused by E. coli, K. pneumoniae or P. mirabilis:Cefazolin is resistant if MIC > or = 8 mcg/mL.(Distinguishing susceptible versus intermediatefor isolates with MIC < or = 4 mcg/mL requiresadditional testing.)For uncomplicated UTI caused by E. coli,K. pneumoniae or P. mirabilis: Cefazolin issusceptible if MIC <32 mcg/mL and predictssusceptible to the oral agents cefaclor, cefdinir,cefpodoxime, cefprozil, cefuroxime, cephalexinand loracarbef.    CEFEPIME <=1 Sensitive     CEFTRIAXONE <=1 Sensitive     CIPROFLOXACIN >=4 Resistant     LEVOFLOXACIN >=8 Resistant     ERTAPENEM <=0.5 Sensitive     GENTAMICIN <=1 Sensitive     IMIPENEM <=0.25 Sensitive     NITROFURANTOIN <=16  Sensitive     PIP/TAZO <=4 Sensitive     TOBRAMYCIN <=1 Sensitive     TRIMETH/SULFA* <=20 Sensitive      * For infections other than uncomplicated UTIcaused by E. coli, K. pneumoniae or P. mirabilis:Cefazolin is resistant if MIC > or = 8 mcg/mL.(Distinguishing susceptible versus intermediatefor isolates with MIC < or = 4 mcg/mL requiresadditional testing.)For uncomplicated UTI caused by E. coli,K. pneumoniae or P. mirabilis: Cefazolin issusceptible if MIC <32 mcg/mL and predictssusceptible to the oral agents cefaclor, cefdinir,cefpodoxime, cefprozil, cefuroxime, cephalexinand loracarbef.Legend:S = Susceptible  I = IntermediateR = Resistant  NS = Not susceptible* = Not tested  NR = Not reported**NN = See antimicrobic comments  POCT URINALYSIS DIP (CLINITEK)     Status: Abnormal   Collection Time: 02/19/21  1:39 PM  Result Value Ref Range   Color, UA yellow yellow   Clarity, UA cloudy (A) clear   Glucose, UA negative negative mg/dL   Bilirubin, UA negative negative   Ketones, POC UA negative negative mg/dL   Spec Grav, UA 1.010 1.010 - 1.025   Blood, UA moderate (A) negative   pH, UA 6.5 5.0 - 8.0   POC PROTEIN,UA trace negative, trace   Urobilinogen, UA 0.2 0.2 or 1.0 E.U./dL   Nitrite, UA Positive (A) Negative   Leukocytes, UA Large (3+) (A) Negative  Urine Culture     Status: None   Collection Time: 03/01/21 11:08 AM   Specimen: Urine  Result Value Ref Range   MICRO NUMBER: [id number]    SPECIMEN QUALITY: Adequate    Sample Source URINE, CLEAN CATCH    STATUS: FINAL    Result:      Less than 10,000 CFU/mL of single Gram positive organism isolated. No further testing will be performed. If clinically indicated, recollection using a method to minimize contamination,  with prompt transfer to Urine Culture Transport Tube, is recommended.  POCT URINALYSIS DIP (CLINITEK)     Status: Abnormal   Collection Time: 03/01/21 11:09 AM  Result Value Ref Range   Color, UA yellow yellow   Clarity, UA  clear clear   Glucose, UA negative negative mg/dL   Bilirubin, UA negative negative   Ketones, POC UA negative negative mg/dL   Spec Grav, UA 1.010 1.010 - 1.025   Blood, UA negative negative   pH, UA 5.5 5.0 - 8.0   POC PROTEIN,UA negative negative, trace   Urobilinogen, UA 0.2 0.2 or 1.0 E.U./dL   Nitrite, UA Negative Negative   Leukocytes, UA Small (1+) (A) Negative  CBC with Differential/Platelet     Status: Abnormal   Collection Time: 03/03/21  3:34 PM  Result Value Ref Range   WBC 9.0 3.4 - 10.8 x10E3/uL   RBC 2.71 (LL) 3.77 - 5.28 x10E6/uL   Hemoglobin 8.9 (L) 11.1 - 15.9 g/dL   Hematocrit 26.0 (L) 34.0 - 46.6 %   MCV 96 79 - 97 fL   MCH 32.8 26.6 - 33.0 pg   MCHC 34.2 31.5 - 35.7 g/dL   RDW 10.6 (L) 11.7 - 15.4 %   Platelets 449 150 - 450 x10E3/uL   Neutrophils 70 Not Estab. %   Lymphs 18 Not Estab. %   Monocytes 8 Not Estab. %   Eos 2 Not Estab. %   Basos 1 Not Estab. %   Neutrophils Absolute 6.4 1.4 - 7.0 x10E3/uL   Lymphocytes Absolute 1.6 0.7 - 3.1 x10E3/uL   Monocytes Absolute 0.7 0.1 - 0.9 x10E3/uL   EOS (ABSOLUTE) 0.2 0.0 - 0.4 x10E3/uL   Basophils Absolute 0.1 0.0 - 0.2 x10E3/uL   Immature Granulocytes 1 Not Estab. %   Immature Grans (Abs) 0.1 0.0 - 0.1 x10E3/uL  CMP14+EGFR     Status: Abnormal   Collection Time: 03/03/21  3:34 PM  Result Value Ref Range   Glucose 105 (H) 65 - 99 mg/dL   BUN 11 8 - 27 mg/dL   Creatinine, Ser 1.32 (H) 0.57 - 1.00 mg/dL   eGFR 43 (L) >59 mL/min/1.73   BUN/Creatinine Ratio 8 (L) 12 - 28   Sodium 141 134 - 144 mmol/L   Potassium 4.4 3.5 - 5.2 mmol/L   Chloride 95 (L) 96 - 106 mmol/L   CO2 26 20 - 29 mmol/L   Calcium 9.2 8.7 - 10.3 mg/dL   Total Protein 7.2 6.0 - 8.5 g/dL   Albumin 3.8 3.8 - 4.8 g/dL   Globulin, Total 3.4 1.5 - 4.5 g/dL   Albumin/Globulin Ratio 1.1 (L) 1.2 - 2.2   Bilirubin Total <0.2 0.0 - 1.2 mg/dL   Alkaline Phosphatase 85 44 - 121 IU/L   AST 17 0 - 40 IU/L   ALT 6 0 - 32 IU/L      Mental  Status Examination;   Psychiatric Specialty Exam: Physical Exam  Review of Systems  Cardiovascular: Negative for chest pain.  Psychiatric/Behavioral: Negative for depression and suicidal ideas.    There were no vitals taken for this visit.There is no height or weight on file to calculate BMI.  General Appearance:   Eye Contact::    Speech:  Slow  Volume:  Decreased  Mood: fair  Affect:  congruent  Thought Process:  Coherent and intact  Orientation:  Full (Time, Place, and Person)  Thought Content:  Rumination  Suicidal Thoughts:  No  Homicidal Thoughts:  No  Memory:  Immediate;   Fair Recent;   Fair  Judgement:  Fair  Insight:  Shallow  Psychomotor Activity:  Normal  Concentration:  Fair  Recall:  Fair  Akathisia:  Negative  Handed:  Right  AIMS (if indicated):     Assets:  Social Support Vocational/Educational  Sleep:      Prior documentation, copy reviewed   Assessment: Axis I: Panic disorder. Possible PTSD. Rule out generalized anxiety disorder. Nicotine dependence  Axis II: Deferred  Axis III:  Past Medical History:  Diagnosis Date  . Acid reflux   . Anxiety   . High blood pressure   . High serum parathyroid hormone (PTH) 07/19/2017   96 06/28/2014, 162 12/29/16  . Hypothyroid   . PTSD (post-traumatic stress disorder)     Axis IV: Psychosocial. History of trauma   Treatment Plan and Summary: Prior documentation reviewed PTSD: Baseline continue Lexapro small dose discussed compliance d Follow-up in 3 months or earlier if needed  Claire Capron, MD 03/25/2021

## 2021-03-28 ENCOUNTER — Other Ambulatory Visit: Payer: Self-pay | Admitting: Physician Assistant

## 2021-03-28 DIAGNOSIS — J432 Centrilobular emphysema: Secondary | ICD-10-CM

## 2021-03-31 ENCOUNTER — Telehealth: Payer: Self-pay

## 2021-03-31 DIAGNOSIS — J441 Chronic obstructive pulmonary disease with (acute) exacerbation: Secondary | ICD-10-CM

## 2021-03-31 DIAGNOSIS — J449 Chronic obstructive pulmonary disease, unspecified: Secondary | ICD-10-CM

## 2021-03-31 MED ORDER — AMBULATORY NON FORMULARY MEDICATION: 99 refills | Status: DC

## 2021-03-31 NOTE — Telephone Encounter (Signed)
Thank you. Did I not need to sign it?

## 2021-03-31 NOTE — Telephone Encounter (Signed)
[name] would like a portable concentrator. Printed prescription. Printed and left up front for [name] to pick up.

## 2021-04-15 ENCOUNTER — Telehealth (HOSPITAL_COMMUNITY): Payer: Self-pay

## 2021-04-15 ENCOUNTER — Other Ambulatory Visit (HOSPITAL_COMMUNITY): Payer: Self-pay | Admitting: Psychiatry

## 2021-04-15 NOTE — Telephone Encounter (Signed)
Patient needs a refill on Clonazepam sent to [facility] pharmacy

## 2021-04-15 NOTE — Telephone Encounter (Signed)
I already have sent it today

## 2021-04-21 ENCOUNTER — Telehealth (INDEPENDENT_AMBULATORY_CARE_PROVIDER_SITE_OTHER): Payer: Medicare Other | Admitting: Physician Assistant

## 2021-04-21 ENCOUNTER — Encounter: Payer: Self-pay | Admitting: Physician Assistant

## 2021-04-21 DIAGNOSIS — N1832 Chronic kidney disease, stage 3b: Secondary | ICD-10-CM

## 2021-04-21 DIAGNOSIS — D638 Anemia in other chronic diseases classified elsewhere: Secondary | ICD-10-CM | POA: Diagnosis not present

## 2021-04-21 DIAGNOSIS — R8290 Unspecified abnormal findings in urine: Secondary | ICD-10-CM

## 2021-04-21 DIAGNOSIS — N390 Urinary tract infection, site not specified: Secondary | ICD-10-CM

## 2021-04-21 DIAGNOSIS — Z20822 Contact with and (suspected) exposure to covid-19: Secondary | ICD-10-CM

## 2021-04-21 NOTE — Progress Notes (Signed)
..Virtual Visit via Telephone Note  I connected with Claire [name] on 04/23/21 at  2:20 PM EDT by telephone and verified that I am speaking with the correct person using two identifiers.  Location: Patient: home [name]: clinic  .[name] KitchenParticipating in visit:  Patient: Claire [name]: Tandy Gaw PA-C   I discussed the limitations, risks, security and privacy concerns of performing an evaluation and management service by telephone and the availability of in person appointments. I also discussed with the patient that there may be a patient responsible charge related to this service. The patient expressed understanding and agreed to proceed.   History of Present Illness: Patient is a 70 year old female with severe COPD, hypothyroidism, CKD 3B, recurrent UTIs who calls into the clinic to discuss her health anxiety.  She has noticed more cloudy urine lately.  She denies any fever, chills.  She would like to have her urine checked.  She denies any nausea, vomiting.  She is going to the bathroom a little more often and has cloudy appearance.  She is still not been to hematology for her anemia of chronic disease.  COPD is stable.  Patient has not yet gotten COVID-vaccine.  She is very concerned about side effects.  She has been around her family with COVID.  She would like to check her antibody status.   .. Active Ambulatory Problems    Diagnosis Date Noted  . PTSD (post-traumatic stress disorder) 11/10/2011  . Neurosis, anxiety, panic type 11/10/2011  . Acquired hypothyroidism 11/24/2014  . BP (high blood pressure) 07/24/2012  . Chronic use of benzodiazepine for therapeutic purpose 08/11/2015  . Hyponatremia 12/19/2016  . Hyperkalemia 12/19/2016  . Centrilobular emphysema (HCC) 12/20/2016  . Stage 3a chronic kidney disease (HCC) 01/16/2017  . Iron deficiency anemia 01/16/2017  . Anemia of chronic disease 05/11/2017  . Low serum vitamin B12 05/14/2017  . Vitamin D deficiency  05/14/2017  . Depressed mood 05/14/2017  . High serum parathyroid hormone (PTH) 07/19/2017  . Normocytic anemia 07/19/2017  . DOE (dyspnea on exertion) 05/09/2018  . Recurrent UTI 05/07/2019  . Leg cramps 05/29/2019  . No energy 05/29/2019  . History of hypokalemia 10/09/2019  . Vaccine counseling 12/24/2019  . Menopausal vaginal dryness 12/24/2019  . Chronic rhinitis 01/31/2020  . Chronic respiratory failure with hypoxia (HCC) 01/31/2020  . Unintended weight loss 02/19/2020  . Macrocytic anemia 03/10/2020  . Elevated ferritin 03/10/2020  . Irritable bowel syndrome with diarrhea 03/10/2020  . Anxiety about health 03/23/2020  . Pernicious anemia 03/23/2020  . Malnutrition of mild degree (HCC) 05/06/2020  . Body mass index (BMI) of 19 or less in adult 05/08/2020  . Constipation 07/13/2020  . Lower extremity edema 07/29/2020  . Clostridium difficile colitis 09/11/2020  . Mild protein-calorie malnutrition (HCC) 01/19/2021  . Underweight 01/22/2021  . History of Clostridioides difficile colitis 02/22/2021  . Medication management 03/15/2021  . Cloudy urine 04/23/2021  . Stage 3b chronic kidney disease (HCC) 04/23/2021   Resolved Ambulatory Problems    Diagnosis Date Noted  . Hypothyroid   . Acute renal failure (HCC) 12/19/2016  . Decreased hemoglobin 12/20/2016  . Acute kidney injury (HCC) 03/17/2017  . Sinobronchitis 06/16/2020   Past Medical History:  Diagnosis Date  . Acid reflux   . Anxiety   . High blood pressure    Reviewed med, allergy, problem list.     Observations/Objective: No acute distress Normal breathing  Assessment and Plan: [name] KitchenMarland KitchenSeara was seen today for advice only.  Diagnoses and all  orders for this visit:  Cloudy urine -     Urine Culture  Stage 3b chronic kidney disease (HCC) -     Comprehensive metabolic panel  Close exposure to COVID-19 virus -     SARS-CoV-2 Semi-Quantitative Total Antibody, Spike  Anemia of chronic disease -     CBC  w/Diff/Platelet -     Fe+TIBC+Fer  Recurrent UTI   Per pt urine is cloudy. Hx of recurrent UTI. Order placed to have urinalysis and culture done.  Symptomatic care discussed.  Continue to use premarin for prevention.   Ongoing anemia and low RBC. Ordered labs for recheck. Has appt with hematology in next month.   Will test for covid antibodies. Pt would like to know status. Pt still hesitant about covid vaccine.    Follow Up Instructions:    I discussed the assessment and treatment plan with the patient. The patient was provided an opportunity to ask questions and all were answered. The patient agreed with the plan and demonstrated an understanding of the instructions.   The patient was advised to call back or seek an in-person evaluation if the symptoms worsen or if the condition fails to improve as anticipated.  I provided 20 minutes of non-face-to-face time during this encounter.   Tandy Gaw, PA-C

## 2021-04-22 NOTE — Telephone Encounter (Signed)
Error

## 2021-04-23 ENCOUNTER — Other Ambulatory Visit: Payer: Self-pay

## 2021-04-23 ENCOUNTER — Encounter: Payer: Self-pay | Admitting: Physician Assistant

## 2021-04-23 ENCOUNTER — Telehealth: Payer: Self-pay

## 2021-04-23 ENCOUNTER — Ambulatory Visit (INDEPENDENT_AMBULATORY_CARE_PROVIDER_SITE_OTHER): Payer: Medicare Other | Admitting: Physician Assistant

## 2021-04-23 VITALS — BP 111/72 | HR 104

## 2021-04-23 DIAGNOSIS — N390 Urinary tract infection, site not specified: Secondary | ICD-10-CM | POA: Diagnosis not present

## 2021-04-23 DIAGNOSIS — Z8619 Personal history of other infectious and parasitic diseases: Secondary | ICD-10-CM

## 2021-04-23 DIAGNOSIS — R8290 Unspecified abnormal findings in urine: Secondary | ICD-10-CM | POA: Insufficient documentation

## 2021-04-23 DIAGNOSIS — R350 Frequency of micturition: Secondary | ICD-10-CM | POA: Diagnosis not present

## 2021-04-23 DIAGNOSIS — N3001 Acute cystitis with hematuria: Secondary | ICD-10-CM

## 2021-04-23 DIAGNOSIS — N1832 Chronic kidney disease, stage 3b: Secondary | ICD-10-CM | POA: Insufficient documentation

## 2021-04-23 LAB — POCT URINALYSIS DIP (CLINITEK)
Bilirubin, UA: NEGATIVE
Glucose, UA: NEGATIVE mg/dL
Ketones, POC UA: NEGATIVE mg/dL
Nitrite, UA: POSITIVE — AB
POC PROTEIN,UA: 100 — AB
Spec Grav, UA: 1.02 (ref 1.010–1.025)
Urobilinogen, UA: 0.2 E.U./dL
pH, UA: 7 (ref 5.0–8.0)

## 2021-04-23 MED ORDER — CEFTRIAXONE SODIUM 1 G IJ SOLR
1.0000 g | Freq: Once | INTRAMUSCULAR | Status: AC
  Administered 2021-04-23: 1 g via INTRAMUSCULAR

## 2021-04-23 NOTE — Telephone Encounter (Signed)
We can not treat with rocephin without knowing if there is an infection or not. I ordered a urine culture and dipstick with virtual this week. Did she have done? If not she at the least needs to come into office for a dipstick and then let me decide from what that looks like.

## 2021-04-23 NOTE — Progress Notes (Signed)
Established Patient Office Visit  Subjective:  Patient ID: Claire [name], female    DOB: 09/10/1951  Age: 70 y.o. MRN: [id number]  CC:  Chief Complaint  Patient presents with  . Urinary Frequency    HPI Claire [name] complains of cloudy urine and frequent urination for 1 day. Patient reports no recent antibiotic use or no recent catheterization. Patient has not taken Azo. Denies flank pain, pelvic pain, fever, chills or sweats.   Past Medical History:  Diagnosis Date  . Acid reflux   . Anxiety   . High blood pressure   . High serum parathyroid hormone (PTH) 07/19/2017   96 06/28/2014, 162 12/29/16  . Hypothyroid   . PTSD (post-traumatic stress disorder)     History reviewed. No pertinent surgical history.  Family History  Problem Relation Age of Onset  . Anxiety disorder Mother   . OCD Daughter   . Alcohol abuse Father   . Anxiety disorder Sister     Social History   Socioeconomic History  . Marital status: Divorced    Spouse name: Not on file  . Number of children: 2  . Years of education: 18  . Highest education level: Associate degree: academic program  Occupational History  . 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 Smoking status: Current Some Day Smoker    Packs/day: 0.25    Years: 50.00    Pack years: 12.50    Types: Cigarettes  . Smokeless tobacco: Never Used  Vaping Use  . Vaping Use: Never used  Substance and Sexual Activity  . Alcohol use: No  . Drug use: No  . Sexual activity: Not Currently  Other Topics Concern  . Not on file  Social History Narrative  . Not on file   Social Determinants of Health   Financial Resource Strain: Not on file  Food Insecurity: Not on file  Transportation Needs: Not on file  Physical Activity: Not on file  Stress: Not on file  Social Connections: Not on file  Intimate Partner Violence: Not on file    Outpatient Medications Prior to Visit  Medication Sig Dispense Refill   . albuterol (PROAIR HFA) 108 (90 Base) MCG/ACT inhaler INHALE 1-2 PUFFS INTO THE LUNGS EVERY 4 (FOUR) HOURS AS NEEDED FOR WHEEZING OR SHORTNESS OF BREATH. 54 g 1  . albuterol (PROVENTIL) (2.5 MG/3ML) 0.083% nebulizer solution 1 VIAL IN NEBULIZER EVERY 4 HOURS AS NEEDED WHEEZING OR FOR SHORTNESS OF BREATH 75 mL 6  . AMBULATORY NON FORMULARY MEDICATION Continuous stationary and portable oxygen tanks for use with ambulation. DX: COPD with hypoxia. 4 Device 11  . AMBULATORY NON FORMULARY MEDICATION Portable oxygen concentrator DX: COPD with hypoxia. 1 each prn  . atenolol (TENORMIN) 25 MG tablet Take 1 tablet (25 mg total) by mouth daily. 90 tablet 3  . Azelastine HCl (ASTEPRO) 0.15 % SOLN Place 2 sprays into the nose daily. 30 mL 2  . cetirizine (ZYRTEC) 10 MG chewable tablet Chew 10 mg by mouth daily.    . cholecalciferol (VITAMIN D) 25 MCG (1000 UT) tablet Take 1,000 Units by mouth daily.    . clonazePAM (KLONOPIN) 0.5 MG tablet TAKE ONE TABLET BY MOUTH THREE TIMES DAILY 90 tablet 0  . dicyclomine (BENTYL) 10 MG capsule Take 1 capsule (10 mg total) by mouth 3 (three) times daily before meals. 270 capsule 2  . escitalopram (LEXAPRO) 10 MG tablet Take 1/2 tablet for 7 days then increase  to one tablet daily. 30 tablet 2  . estradiol (ESTRACE VAGINAL) 0.1 MG/GM vaginal cream Place 1 Applicatorful vaginally 3 (three) times a week. And one pea-size amt applied to urethral opening. 42.5 g 1  . famotidine (PEPCID) 20 MG tablet TAKE 1 TABLET BY MOUTH TWICE A DAY (Patient taking differently: Take 20 mg by mouth daily as needed.) 180 tablet 1  . ferrous sulfate 325 (65 FE) MG EC tablet 1 tab PO TID every other day 90 tablet 11  . Lactobacillus (FLORAJEN ACIDOPHILUS) CAPS Take 1 capsule by mouth daily.    [name] levothyroxine (SYNTHROID) 75 MCG tablet TAKE 1 TABLET BY MOUTH EVERY DAY 90 tablet 0  . mometasone-formoterol (DULERA) 100-5 MCG/ACT AERO TAKE 2 PUFFS BY MOUTH TWICE A DAY 13 each 0  . pantoprazole  (PROTONIX) 40 MG tablet Take 1 tablet (40 mg total) by mouth daily. 30 tablet 3  . Tiotropium Bromide Monohydrate (SPIRIVA RESPIMAT) 2.5 MCG/ACT AERS Inhale 2 puffs into the lungs daily. 12 g 1   No facility-administered medications prior to visit.    Allergies  Allergen Reactions  . Bactrim [Sulfamethoxazole-Trimethoprim]     ACUTE RENAL fAILURE/HYPONATREMIA/HYPERKALEMIA  . Macrodantin [Nitrofurantoin Macrocrystal] Rash  . Trimethoprim     Fatigue/weakness/no appetite  . Zithromax [Azithromycin]     diarrhea    ROS Review of Systems    Objective:    Physical Exam  BP 111/72   Pulse (!) 104   SpO2 100%  Wt Readings from Last 3 Encounters:  03/15/21 96 lb (43.5 kg)  03/01/21 96 lb (43.5 kg)  02/19/21 96 lb (43.5 kg)     Health Maintenance Due  Topic Date Due  . COVID-19 Vaccine (1) Never done    There are no preventive care reminders to display for this patient.  Lab Results  Component Value Date   TSH 1.530 03/03/2020   Lab Results  Component Value Date   WBC 9.0 03/03/2021   HGB 8.9 (L) 03/03/2021   HCT 26.0 (L) 03/03/2021   MCV 96 03/03/2021   PLT 449 03/03/2021   Lab Results  Component Value Date   NA 141 03/03/2021   K 4.4 03/03/2021   CO2 26 03/03/2021   GLUCOSE 105 (H) 03/03/2021   BUN 11 03/03/2021   CREATININE 1.32 (H) 03/03/2021   BILITOT <0.2 03/03/2021   ALKPHOS 85 03/03/2021   AST 17 03/03/2021   ALT 6 03/03/2021   PROT 7.2 03/03/2021   ALBUMIN 3.8 03/03/2021   CALCIUM 9.2 03/03/2021   EGFR 43 (L) 03/03/2021   Lab Results  Component Value Date   CHOL 174 03/03/2020   Lab Results  Component Value Date   HDL 55 03/03/2020   Lab Results  Component Value Date   LDLCALC 105 (H) 03/03/2020   Lab Results  Component Value Date   TRIG 75 03/03/2020   Lab Results  Component Value Date   CHOLHDL 3.2 03/03/2020   No results found for: HGBA1C    Assessment & Plan:  UTI - Per Jade- Rocephin 1 gram given without  complications. Urinalysis shows blood and leu along with positive nitrite. Sent urine for a culture.   Problem List Items Addressed This Visit    Recurrent UTI   Cloudy urine   Relevant Orders   POCT URINALYSIS DIP (CLINITEK)   Urine Culture    Other Visit Diagnoses    Frequent urination    -  Primary   Relevant Medications   cefTRIAXone (ROCEPHIN) injection 1  g (Completed) (Start on 04/23/2021  4:15 PM)   Other Relevant Orders   POCT URINALYSIS DIP (CLINITEK)   Urine Culture      Meds ordered this encounter  Medications  . cefTRIAXone (ROCEPHIN) injection 1 g    Order Specific Question:   Antibiotic Indication:    Answer:   UTI    Follow-up: Return if symptoms worsen or fail to improve.    Lavell Luster, [place]

## 2021-04-23 NOTE — Telephone Encounter (Signed)
Claire [name] states she will come by as soon as her daughter picks her up.

## 2021-04-23 NOTE — Telephone Encounter (Signed)
Claire [name] states she has urinary frequency and cloudy urine. I advised her to go to the urgent care. She refuses to go to the urgent care. She is wanting to come in for a Rocephin injection. Please advise.

## 2021-04-25 LAB — URINE CULTURE
MICRO NUMBER:: 11918582
SPECIMEN QUALITY:: ADEQUATE

## 2021-04-26 ENCOUNTER — Other Ambulatory Visit: Payer: Self-pay

## 2021-04-26 ENCOUNTER — Ambulatory Visit (INDEPENDENT_AMBULATORY_CARE_PROVIDER_SITE_OTHER): Payer: Medicare Other | Admitting: Physician Assistant

## 2021-04-26 VITALS — BP 125/71 | HR 96 | Temp 98.4°F | Resp 20 | Ht 63.0 in | Wt 96.0 lb

## 2021-04-26 DIAGNOSIS — N390 Urinary tract infection, site not specified: Secondary | ICD-10-CM

## 2021-04-26 DIAGNOSIS — N3001 Acute cystitis with hematuria: Secondary | ICD-10-CM | POA: Insufficient documentation

## 2021-04-26 MED ORDER — CEFTRIAXONE SODIUM 1 G IJ SOLR
1.0000 g | Freq: Once | INTRAMUSCULAR | Status: AC
  Administered 2021-04-26: 1 g via INTRAMUSCULAR

## 2021-04-26 NOTE — Progress Notes (Signed)
[name],   Culture did grow e.coli. uncomplicated UTI one shot of rocephin can treat. How are symptoms today? If not resolved I would suggest 3 more days of 1g shots in office. I want to avoid oral antibiotics and vancomycin addition if we can.

## 2021-04-26 NOTE — Progress Notes (Signed)
Patient ID: Claire [name], female   DOB: 1951-03-05, 70 y.o.   MRN: [id number]  .[name] Results for orders placed or performed in visit on 04/23/21  Urine Culture   Specimen: Urine  Result Value Ref Range   MICRO NUMBER: [id number]    SPECIMEN QUALITY: Adequate    Sample Source NOT GIVEN    STATUS: FINAL    ISOLATE 1: Escherichia coli (A)       Susceptibility   Escherichia coli - URINE CULTURE, REFLEX    AMOX/CLAVULANIC 4 Sensitive     AMPICILLIN 4 Sensitive     AMPICILLIN/SULBACTAM <=2 Sensitive     CEFAZOLIN* <=4 Not Reportable      * For infections other than uncomplicated UTIcaused by E. coli, K. pneumoniae or P. mirabilis:Cefazolin is resistant if MIC > or = 8 mcg/mL.(Distinguishing susceptible versus intermediatefor isolates with MIC < or = 4 mcg/mL requiresadditional testing.)For uncomplicated UTI caused by E. coli,K. pneumoniae or P. mirabilis: Cefazolin issusceptible if MIC <32 mcg/mL and predictssusceptible to the oral agents cefaclor, cefdinir,cefpodoxime, cefprozil, cefuroxime, cephalexinand loracarbef.    CEFEPIME <=1 Sensitive     CEFTRIAXONE <=1 Sensitive     CIPROFLOXACIN >=4 Resistant     LEVOFLOXACIN >=8 Resistant     ERTAPENEM <=0.5 Sensitive     GENTAMICIN <=1 Sensitive     IMIPENEM <=0.25 Sensitive     NITROFURANTOIN <=16 Sensitive     PIP/TAZO <=4 Sensitive     TOBRAMYCIN <=1 Sensitive     TRIMETH/SULFA* <=20 Sensitive      * For infections other than uncomplicated UTIcaused by E. coli, K. pneumoniae or P. mirabilis:Cefazolin is resistant if MIC > or = 8 mcg/mL.(Distinguishing susceptible versus intermediatefor isolates with MIC < or = 4 mcg/mL requiresadditional testing.)For uncomplicated UTI caused by E. coli,K. pneumoniae or P. mirabilis: Cefazolin issusceptible if MIC <32 mcg/mL and predictssusceptible to the oral agents cefaclor, cefdinir,cefpodoxime, cefprozil, cefuroxime, cephalexinand loracarbef.Legend:S = Susceptible  I = IntermediateR = Resistant  NS = Not  susceptible* = Not tested  NR = Not reported**NN = See antimicrobic comments  POCT URINALYSIS DIP (CLINITEK)  Result Value Ref Range   Color, UA yellow yellow   Clarity, UA cloudy (A) clear   Glucose, UA negative negative mg/dL   Bilirubin, UA negative negative   Ketones, POC UA negative negative mg/dL   Spec Grav, UA 4.235 3.614 - 1.025   Blood, UA large (A) negative   pH, UA 7.0 5.0 - 8.0   POC PROTEIN,UA =100 (A) negative, trace   Urobilinogen, UA 0.2 0.2 or 1.0 E.U./dL   Nitrite, UA Positive (A) Negative   Leukocytes, UA Large (3+) (A) Negative   .[name] KitchenZelphia was seen today for urinary frequency.  Diagnoses and all orders for this visit:  Acute cystitis with hematuria  Frequent urination -     POCT URINALYSIS DIP (CLINITEK) -     Urine Culture -     cefTRIAXone (ROCEPHIN) injection 1 g  Cloudy urine -     POCT URINALYSIS DIP (CLINITEK) -     Urine Culture -     cefTRIAXone (ROCEPHIN) injection 1 g  Recurrent UTI -     cefTRIAXone (ROCEPHIN) injection 1 g  History of Clostridioides difficile colitis   Pt has hx of recurrent UTI but also history of C.diff.  Last UTI was 3/18 and treated with rocephin and Augmentin but had to take vancomycin with via GI to prevent C.diff.  I would like to stick with just rocephin to  avoid any vancomycin.  1g given today. Will call on Monday and see how symptoms are. Ok to repeat for 4 days.

## 2021-04-26 NOTE — Progress Notes (Signed)
Established Patient Office Visit  Subjective:  Patient ID: Claire [name], female    DOB: 1951-07-25  Age: 70 y.o. MRN: [id number]  CC:  Chief Complaint  Patient presents with  . Injections    HPI [name] presents for a Rocephin injection (1/3). 1g Rocephin diluted with 2.1 mL lidocaine 1% given in RUOQ. Pt tolerated well with no apparent complications. Pt will return tomorrow for next injection (2/3).  Past Medical History:  Diagnosis Date  . Acid reflux   . Anxiety   . High blood pressure   . High serum parathyroid hormone (PTH) 07/19/2017   96 06/28/2014, 162 12/29/16  . Hypothyroid   . PTSD (post-traumatic stress disorder)     History reviewed. No pertinent surgical history.  Family History  Problem Relation Age of Onset  . Anxiety disorder Mother   . OCD Daughter   . Alcohol abuse Father   . Anxiety disorder Sister     Social History   Socioeconomic History  . Marital status: Divorced    Spouse name: Not on file  . Number of children: 2  . Years of education: 82  . Highest education level: Associate degree: academic program  Occupational History  . 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 Smoking status: Current Some Day Smoker    Packs/day: 0.25    Years: 50.00    Pack years: 12.50    Types: Cigarettes  . Smokeless tobacco: Never Used  Vaping Use  . Vaping Use: Never used  Substance and Sexual Activity  . Alcohol use: No  . Drug use: No  . Sexual activity: Not Currently  Other Topics Concern  . Not on file  Social History Narrative  . Not on file   Social Determinants of Health   Financial Resource Strain: Not on file  Food Insecurity: Not on file  Transportation Needs: Not on file  Physical Activity: Not on file  Stress: Not on file  Social Connections: Not on file  Intimate Partner Violence: Not on file    Outpatient Medications Prior to Visit  Medication Sig Dispense Refill  . albuterol (PROAIR  HFA) 108 (90 Base) MCG/ACT inhaler INHALE 1-2 PUFFS INTO THE LUNGS EVERY 4 (FOUR) HOURS AS NEEDED FOR WHEEZING OR SHORTNESS OF BREATH. 54 g 1  . albuterol (PROVENTIL) (2.5 MG/3ML) 0.083% nebulizer solution 1 VIAL IN NEBULIZER EVERY 4 HOURS AS NEEDED WHEEZING OR FOR SHORTNESS OF BREATH 75 mL 6  . AMBULATORY NON FORMULARY MEDICATION Continuous stationary and portable oxygen tanks for use with ambulation. DX: COPD with hypoxia. 4 Device 11  . AMBULATORY NON FORMULARY MEDICATION Portable oxygen concentrator DX: COPD with hypoxia. 1 each prn  . atenolol (TENORMIN) 25 MG tablet Take 1 tablet (25 mg total) by mouth daily. 90 tablet 3  . Azelastine HCl (ASTEPRO) 0.15 % SOLN Place 2 sprays into the nose daily. 30 mL 2  . cetirizine (ZYRTEC) 10 MG chewable tablet Chew 10 mg by mouth daily.    . cholecalciferol (VITAMIN D) 25 MCG (1000 UT) tablet Take 1,000 Units by mouth daily.    . clonazePAM (KLONOPIN) 0.5 MG tablet TAKE ONE TABLET BY MOUTH THREE TIMES DAILY 90 tablet 0  . dicyclomine (BENTYL) 10 MG capsule Take 1 capsule (10 mg total) by mouth 3 (three) times daily before meals. 270 capsule 2  . escitalopram (LEXAPRO) 10 MG tablet Take 1/2 tablet for 7 days then increase to one tablet daily.  30 tablet 2  . estradiol (ESTRACE VAGINAL) 0.1 MG/GM vaginal cream Place 1 Applicatorful vaginally 3 (three) times a week. And one pea-size amt applied to urethral opening. 42.5 g 1  . famotidine (PEPCID) 20 MG tablet TAKE 1 TABLET BY MOUTH TWICE A DAY (Patient taking differently: Take 20 mg by mouth daily as needed.) 180 tablet 1  . ferrous sulfate 325 (65 FE) MG EC tablet 1 tab PO TID every other day 90 tablet 11  . Lactobacillus (FLORAJEN ACIDOPHILUS) CAPS Take 1 capsule by mouth daily.    [name] levothyroxine (SYNTHROID) 75 MCG tablet TAKE 1 TABLET BY MOUTH EVERY DAY 90 tablet 0  . mometasone-formoterol (DULERA) 100-5 MCG/ACT AERO TAKE 2 PUFFS BY MOUTH TWICE A DAY 13 each 0  . pantoprazole (PROTONIX) 40 MG tablet Take 1  tablet (40 mg total) by mouth daily. 30 tablet 3  . Tiotropium Bromide Monohydrate (SPIRIVA RESPIMAT) 2.5 MCG/ACT AERS Inhale 2 puffs into the lungs daily. 12 g 1   No facility-administered medications prior to visit.    Allergies  Allergen Reactions  . Bactrim [Sulfamethoxazole-Trimethoprim]     ACUTE RENAL fAILURE/HYPONATREMIA/HYPERKALEMIA  . Macrodantin [Nitrofurantoin Macrocrystal] Rash  . Trimethoprim     Fatigue/weakness/no appetite  . Zithromax [Azithromycin]     diarrhea    ROS Review of Systems    Objective:    Physical Exam  BP 125/71 (BP Location: Left Arm, Patient Position: Sitting, Cuff Size: Normal)   Pulse 96   Temp 98.4 F (36.9 C) (Oral)   Resp 20   Ht 5' 3"  (1.6 m)   Wt 96 lb (43.5 kg)   SpO2 100%   BMI 17.01 kg/m  Wt Readings from Last 3 Encounters:  04/26/21 96 lb (43.5 kg)  03/15/21 96 lb (43.5 kg)  03/01/21 96 lb (43.5 kg)     Health Maintenance Due  Topic Date Due  . COVID-19 Vaccine (1) Never done    There are no preventive care reminders to display for this patient.  Lab Results  Component Value Date   TSH 1.530 03/03/2020   Lab Results  Component Value Date   WBC 9.0 03/03/2021   HGB 8.9 (L) 03/03/2021   HCT 26.0 (L) 03/03/2021   MCV 96 03/03/2021   PLT 449 03/03/2021   Lab Results  Component Value Date   NA 141 03/03/2021   K 4.4 03/03/2021   CO2 26 03/03/2021   GLUCOSE 105 (H) 03/03/2021   BUN 11 03/03/2021   CREATININE 1.32 (H) 03/03/2021   BILITOT <0.2 03/03/2021   ALKPHOS 85 03/03/2021   AST 17 03/03/2021   ALT 6 03/03/2021   PROT 7.2 03/03/2021   ALBUMIN 3.8 03/03/2021   CALCIUM 9.2 03/03/2021   EGFR 43 (L) 03/03/2021   Lab Results  Component Value Date   CHOL 174 03/03/2020   Lab Results  Component Value Date   HDL 55 03/03/2020   Lab Results  Component Value Date   LDLCALC 105 (H) 03/03/2020   Lab Results  Component Value Date   TRIG 75 03/03/2020   Lab Results  Component Value Date    CHOLHDL 3.2 03/03/2020   No results found for: HGBA1C    Assessment & Plan:  Pt tolerated well with no apparent complications. Pt will return tomorrow for next injection (2/3). Problem List Items Addressed This Visit      Genitourinary   Recurrent UTI   Acute cystitis with hematuria - Primary      Meds ordered  this encounter  Medications  . cefTRIAXone (ROCEPHIN) injection 1 g    Order Specific Question:   Antibiotic Indication:    Answer:   UTI    Follow-up: Return in about 1 day (around 04/27/2021) for Rocephin Injection.    Ninfa Meeker, CMA

## 2021-04-27 ENCOUNTER — Encounter: Payer: Self-pay | Admitting: Physician Assistant

## 2021-04-27 ENCOUNTER — Ambulatory Visit (INDEPENDENT_AMBULATORY_CARE_PROVIDER_SITE_OTHER): Payer: Medicare Other | Admitting: Physician Assistant

## 2021-04-27 VITALS — BP 130/77 | HR 94 | Temp 97.9°F | Resp 20 | Ht 63.0 in | Wt 96.0 lb

## 2021-04-27 DIAGNOSIS — N3001 Acute cystitis with hematuria: Secondary | ICD-10-CM

## 2021-04-27 MED ORDER — CEFTRIAXONE SODIUM 1 G IJ SOLR
1.0000 g | Freq: Once | INTRAMUSCULAR | Status: AC
  Administered 2021-04-27: 1 g via INTRAMUSCULAR

## 2021-04-27 NOTE — Progress Notes (Signed)
Established Patient Office Visit  Subjective:  Patient ID: Claire [name], female    DOB: 27-Dec-1950  Age: 70 y.o. MRN: [id number]  CC: No chief complaint on file.   HPI AUNDRIA BITTERMAN presents for SAFA DERNER presents for a Rocephin injection (2/3). 1g Rocephin diluted with 2.1 mL lidocaine 1% given in LUOQ. Pt tolerated well with no apparent complications. Pt will return tomorrow for last injection (3/3).  Past Medical History:  Diagnosis Date  . Acid reflux   . Anxiety   . High blood pressure   . High serum parathyroid hormone (PTH) 07/19/2017   96 06/28/2014, 162 12/29/16  . Hypothyroid   . PTSD (post-traumatic stress disorder)     No past surgical history on file.  Family History  Problem Relation Age of Onset  . Anxiety disorder Mother   . OCD Daughter   . Alcohol abuse Father   . Anxiety disorder Sister     Social History   Socioeconomic History  . Marital status: Divorced    Spouse name: Not on file  . Number of children: 2  . Years of education: 25  . Highest education level: Associate degree: academic program  Occupational History  . 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 Smoking status: Current Some Day Smoker    Packs/day: 0.25    Years: 50.00    Pack years: 12.50    Types: Cigarettes  . Smokeless tobacco: Never Used  Vaping Use  . Vaping Use: Never used  Substance and Sexual Activity  . Alcohol use: No  . Drug use: No  . Sexual activity: Not Currently  Other Topics Concern  . Not on file  Social History Narrative  . Not on file   Social Determinants of Health   Financial Resource Strain: Not on file  Food Insecurity: Not on file  Transportation Needs: Not on file  Physical Activity: Not on file  Stress: Not on file  Social Connections: Not on file  Intimate Partner Violence: Not on file    Outpatient Medications Prior to Visit  Medication Sig Dispense Refill  . albuterol (PROAIR HFA) 108 (90 Base)  MCG/ACT inhaler INHALE 1-2 PUFFS INTO THE LUNGS EVERY 4 (FOUR) HOURS AS NEEDED FOR WHEEZING OR SHORTNESS OF BREATH. 54 g 1  . albuterol (PROVENTIL) (2.5 MG/3ML) 0.083% nebulizer solution 1 VIAL IN NEBULIZER EVERY 4 HOURS AS NEEDED WHEEZING OR FOR SHORTNESS OF BREATH 75 mL 6  . AMBULATORY NON FORMULARY MEDICATION Continuous stationary and portable oxygen tanks for use with ambulation. DX: COPD with hypoxia. 4 Device 11  . AMBULATORY NON FORMULARY MEDICATION Portable oxygen concentrator DX: COPD with hypoxia. 1 each prn  . atenolol (TENORMIN) 25 MG tablet Take 1 tablet (25 mg total) by mouth daily. 90 tablet 3  . Azelastine HCl (ASTEPRO) 0.15 % SOLN Place 2 sprays into the nose daily. 30 mL 2  . cetirizine (ZYRTEC) 10 MG chewable tablet Chew 10 mg by mouth daily.    . cholecalciferol (VITAMIN D) 25 MCG (1000 UT) tablet Take 1,000 Units by mouth daily.    . clonazePAM (KLONOPIN) 0.5 MG tablet TAKE ONE TABLET BY MOUTH THREE TIMES DAILY 90 tablet 0  . dicyclomine (BENTYL) 10 MG capsule Take 1 capsule (10 mg total) by mouth 3 (three) times daily before meals. 270 capsule 2  . escitalopram (LEXAPRO) 10 MG tablet Take 1/2 tablet for 7 days then increase to one tablet daily. [place]  tablet 2  . estradiol (ESTRACE VAGINAL) 0.1 MG/GM vaginal cream Place 1 Applicatorful vaginally 3 (three) times a week. And one pea-size amt applied to urethral opening. 42.5 g 1  . famotidine (PEPCID) 20 MG tablet TAKE 1 TABLET BY MOUTH TWICE A DAY (Patient taking differently: Take 20 mg by mouth daily as needed.) 180 tablet 1  . ferrous sulfate 325 (65 FE) MG EC tablet 1 tab PO TID every other day 90 tablet 11  . Lactobacillus (FLORAJEN ACIDOPHILUS) CAPS Take 1 capsule by mouth daily.    [name] levothyroxine (SYNTHROID) 75 MCG tablet TAKE 1 TABLET BY MOUTH EVERY DAY 90 tablet 0  . mometasone-formoterol (DULERA) 100-5 MCG/ACT AERO TAKE 2 PUFFS BY MOUTH TWICE A DAY 13 each 0  . pantoprazole (PROTONIX) 40 MG tablet Take 1 tablet (40 mg  total) by mouth daily. 30 tablet 3  . Tiotropium Bromide Monohydrate (SPIRIVA RESPIMAT) 2.5 MCG/ACT AERS Inhale 2 puffs into the lungs daily. 12 g 1   No facility-administered medications prior to visit.    Allergies  Allergen Reactions  . Bactrim [Sulfamethoxazole-Trimethoprim]     ACUTE RENAL fAILURE/HYPONATREMIA/HYPERKALEMIA  . Macrodantin [Nitrofurantoin Macrocrystal] Rash  . Trimethoprim     Fatigue/weakness/no appetite  . Zithromax [Azithromycin]     diarrhea    ROS Review of Systems    Objective:    Physical Exam  There were no vitals taken for this visit. Wt Readings from Last 3 Encounters:  04/26/21 96 lb (43.5 kg)  03/15/21 96 lb (43.5 kg)  03/01/21 96 lb (43.5 kg)     Health Maintenance Due  Topic Date Due  . COVID-19 Vaccine (1) Never done    There are no preventive care reminders to display for this patient.  Lab Results  Component Value Date   TSH 1.530 03/03/2020   Lab Results  Component Value Date   WBC 9.0 03/03/2021   HGB 8.9 (L) 03/03/2021   HCT 26.0 (L) 03/03/2021   MCV 96 03/03/2021   PLT 449 03/03/2021   Lab Results  Component Value Date   NA 141 03/03/2021   K 4.4 03/03/2021   CO2 26 03/03/2021   GLUCOSE 105 (H) 03/03/2021   BUN 11 03/03/2021   CREATININE 1.32 (H) 03/03/2021   BILITOT <0.2 03/03/2021   ALKPHOS 85 03/03/2021   AST 17 03/03/2021   ALT 6 03/03/2021   PROT 7.2 03/03/2021   ALBUMIN 3.8 03/03/2021   CALCIUM 9.2 03/03/2021   EGFR 43 (L) 03/03/2021   Lab Results  Component Value Date   CHOL 174 03/03/2020   Lab Results  Component Value Date   HDL 55 03/03/2020   Lab Results  Component Value Date   LDLCALC 105 (H) 03/03/2020   Lab Results  Component Value Date   TRIG 75 03/03/2020   Lab Results  Component Value Date   CHOLHDL 3.2 03/03/2020   No results found for: HGBA1C    Assessment & Plan:  Pt tolerated injection well with no apparent complications. Pt will return tomorrow for last injection  (3/3). Problem List Items Addressed This Visit   None     No orders of the defined types were placed in this encounter.   Follow-up: Return in about 1 day (around 04/28/2021) for Rocephin Injection.    Ninfa Meeker, CMA

## 2021-04-27 NOTE — Progress Notes (Signed)
Patient ID: Claire [name], female   DOB: October 20, 1951, 70 y.o.   MRN: [id number] Agree with plan.

## 2021-04-27 NOTE — Progress Notes (Signed)
Patient ID: Claire [name], female   DOB: 1951-08-15, 70 y.o.   MRN: [id number] Agree with above plan.

## 2021-04-28 ENCOUNTER — Other Ambulatory Visit: Payer: Self-pay

## 2021-04-28 ENCOUNTER — Ambulatory Visit (INDEPENDENT_AMBULATORY_CARE_PROVIDER_SITE_OTHER): Payer: Medicare Other | Admitting: Physician Assistant

## 2021-04-28 VITALS — BP 110/68 | HR 91 | Temp 98.0°F | Resp 20 | Ht 63.0 in | Wt 96.0 lb

## 2021-04-28 DIAGNOSIS — N3001 Acute cystitis with hematuria: Secondary | ICD-10-CM

## 2021-04-28 LAB — CBC WITH DIFFERENTIAL/PLATELET
Basophils Absolute: 0 10*3/uL (ref 0.0–0.2)
Basos: 1 %
EOS (ABSOLUTE): 0.2 10*3/uL (ref 0.0–0.4)
Eos: 2 %
Hematocrit: 28.2 % — ABNORMAL LOW (ref 34.0–46.6)
Hemoglobin: 9.2 g/dL — ABNORMAL LOW (ref 11.1–15.9)
Immature Grans (Abs): 0 10*3/uL (ref 0.0–0.1)
Immature Granulocytes: 0 %
Lymphocytes Absolute: 1.4 10*3/uL (ref 0.7–3.1)
Lymphs: 21 %
MCH: 31.6 pg (ref 26.6–33.0)
MCHC: 32.6 g/dL (ref 31.5–35.7)
MCV: 97 fL (ref 79–97)
Monocytes Absolute: 0.8 10*3/uL (ref 0.1–0.9)
Monocytes: 12 %
Neutrophils Absolute: 4.2 10*3/uL (ref 1.4–7.0)
Neutrophils: 64 %
Platelets: 320 10*3/uL (ref 150–450)
RBC: 2.91 x10E6/uL — ABNORMAL LOW (ref 3.77–5.28)
RDW: 11.3 % — ABNORMAL LOW (ref 11.7–15.4)
WBC: 6.6 10*3/uL (ref 3.4–10.8)

## 2021-04-28 LAB — IRON,TIBC AND FERRITIN PANEL
Ferritin: 243 ng/mL — ABNORMAL HIGH (ref 15–150)
Iron Saturation: 14 % — ABNORMAL LOW (ref 15–55)
Iron: 34 ug/dL (ref 27–139)
Total Iron Binding Capacity: 237 ug/dL — ABNORMAL LOW (ref 250–450)
UIBC: 203 ug/dL (ref 118–369)

## 2021-04-28 LAB — SARS-COV-2 SEMI-QUANTITATIVE TOTAL ANTIBODY, SPIKE
SARS-CoV-2 Semi-Quant Total Ab: <0.8 U/mL (ref <0.8)
SARS-CoV-2 Spike Ab Interp: NEGATIVE

## 2021-04-28 LAB — COMPREHENSIVE METABOLIC PANEL
ALT: 5 IU/L (ref 0–32)
AST: 19 IU/L (ref 0–40)
Albumin/Globulin Ratio: 1.4 (ref 1.2–2.2)
Albumin: 4.2 g/dL (ref 3.8–4.8)
Alkaline Phosphatase: 88 IU/L (ref 44–121)
BUN/Creatinine Ratio: 10 — ABNORMAL LOW (ref 12–28)
BUN: 10 mg/dL (ref 8–27)
Bilirubin Total: 0.2 mg/dL (ref 0.0–1.2)
CO2: 33 mmol/L — ABNORMAL HIGH (ref 20–29)
Calcium: 9.8 mg/dL (ref 8.7–10.3)
Chloride: 91 mmol/L — ABNORMAL LOW (ref 96–106)
Creatinine, Ser: 0.96 mg/dL (ref 0.57–1.00)
Globulin, Total: 3.1 g/dL (ref 1.5–4.5)
Glucose: 94 mg/dL (ref 65–99)
Potassium: 4.6 mmol/L (ref 3.5–5.2)
Sodium: 137 mmol/L (ref 134–144)
Total Protein: 7.3 g/dL (ref 6.0–8.5)
eGFR: 64 mL/min/{1.73_m2} (ref >59)

## 2021-04-28 MED ORDER — CEFTRIAXONE SODIUM 1 G IJ SOLR
1.0000 g | Freq: Once | INTRAMUSCULAR | Status: AC
  Administered 2021-04-28: 1 g via INTRAMUSCULAR

## 2021-04-28 NOTE — Progress Notes (Signed)
Patient ID: Claire [name], female   DOB: [birth year], 70 y.o.   MRN: [id number] Agree with current plan.

## 2021-04-28 NOTE — Progress Notes (Signed)
Established Patient Office Visit  Subjective:  Patient ID: Claire [name], female    DOB: Jun 08, 1951  Age: 70 y.o. MRN: [id number]  CC:  Chief Complaint  Patient presents with  . Rocephin Injection    HPI Claire [name] presents for a Rocephin injection (3/3). 1g Rocephin diluted with 2.1 mL lidocaine 1% given in RUOQ. Pt tolerated well with no apparent complications.  Past Medical History:  Diagnosis Date  . Acid reflux   . Anxiety   . High blood pressure   . High serum parathyroid hormone (PTH) 07/19/2017   96 06/28/2014, 162 12/29/16  . Hypothyroid   . PTSD (post-traumatic stress disorder)     History reviewed. No pertinent surgical history.  Family History  Problem Relation Age of Onset  . Anxiety disorder Mother   . OCD Daughter   . Alcohol abuse Father   . Anxiety disorder Sister     Social History   Socioeconomic History  . Marital status: Divorced    Spouse name: Not on file  . Number of children: 2  . Years of education: 15  . Highest education level: Associate degree: academic program  Occupational History  . 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 Smoking status: Current Some Day Smoker    Packs/day: 0.25    Years: 50.00    Pack years: 12.50    Types: Cigarettes  . Smokeless tobacco: Never Used  Vaping Use  . Vaping Use: Never used  Substance and Sexual Activity  . Alcohol use: No  . Drug use: No  . Sexual activity: Not Currently  Other Topics Concern  . Not on file  Social History Narrative  . Not on file   Social Determinants of Health   Financial Resource Strain: Not on file  Food Insecurity: Not on file  Transportation Needs: Not on file  Physical Activity: Not on file  Stress: Not on file  Social Connections: Not on file  Intimate Partner Violence: Not on file    Outpatient Medications Prior to Visit  Medication Sig Dispense Refill  . albuterol (PROAIR HFA) 108 (90 Base) MCG/ACT inhaler INHALE  1-2 PUFFS INTO THE LUNGS EVERY 4 (FOUR) HOURS AS NEEDED FOR WHEEZING OR SHORTNESS OF BREATH. 54 g 1  . albuterol (PROVENTIL) (2.5 MG/3ML) 0.083% nebulizer solution 1 VIAL IN NEBULIZER EVERY 4 HOURS AS NEEDED WHEEZING OR FOR SHORTNESS OF BREATH 75 mL 6  . AMBULATORY NON FORMULARY MEDICATION Continuous stationary and portable oxygen tanks for use with ambulation. DX: COPD with hypoxia. 4 Device 11  . AMBULATORY NON FORMULARY MEDICATION Portable oxygen concentrator DX: COPD with hypoxia. 1 each prn  . atenolol (TENORMIN) 25 MG tablet Take 1 tablet (25 mg total) by mouth daily. 90 tablet 3  . Azelastine HCl (ASTEPRO) 0.15 % SOLN Place 2 sprays into the nose daily. 30 mL 2  . cetirizine (ZYRTEC) 10 MG chewable tablet Chew 10 mg by mouth daily.    . cholecalciferol (VITAMIN D) 25 MCG (1000 UT) tablet Take 1,000 Units by mouth daily.    . clonazePAM (KLONOPIN) 0.5 MG tablet TAKE ONE TABLET BY MOUTH THREE TIMES DAILY 90 tablet 0  . dicyclomine (BENTYL) 10 MG capsule Take 1 capsule (10 mg total) by mouth 3 (three) times daily before meals. 270 capsule 2  . escitalopram (LEXAPRO) 10 MG tablet Take 1/2 tablet for 7 days then increase to one tablet daily. 30 tablet 2  . estradiol (ESTRACE  VAGINAL) 0.1 MG/GM vaginal cream Place 1 Applicatorful vaginally 3 (three) times a week. And one pea-size amt applied to urethral opening. 42.5 g 1  . famotidine (PEPCID) 20 MG tablet TAKE 1 TABLET BY MOUTH TWICE A DAY (Patient taking differently: Take 20 mg by mouth daily as needed.) 180 tablet 1  . ferrous sulfate 325 (65 FE) MG EC tablet 1 tab PO TID every other day 90 tablet 11  . Lactobacillus (FLORAJEN ACIDOPHILUS) CAPS Take 1 capsule by mouth daily.    [name] levothyroxine (SYNTHROID) 75 MCG tablet TAKE 1 TABLET BY MOUTH EVERY DAY 90 tablet 0  . mometasone-formoterol (DULERA) 100-5 MCG/ACT AERO TAKE 2 PUFFS BY MOUTH TWICE A DAY 13 each 0  . pantoprazole (PROTONIX) 40 MG tablet Take 1 tablet (40 mg total) by mouth daily. 30  tablet 3  . Tiotropium Bromide Monohydrate (SPIRIVA RESPIMAT) 2.5 MCG/ACT AERS Inhale 2 puffs into the lungs daily. 12 g 1   No facility-administered medications prior to visit.    Allergies  Allergen Reactions  . Bactrim [Sulfamethoxazole-Trimethoprim]     ACUTE RENAL fAILURE/HYPONATREMIA/HYPERKALEMIA  . Macrodantin [Nitrofurantoin Macrocrystal] Rash  . Trimethoprim     Fatigue/weakness/no appetite  . Zithromax [Azithromycin]     diarrhea    ROS Review of Systems    Objective:    Physical Exam  BP 110/68 (BP Location: Left Arm, Patient Position: Sitting, Cuff Size: Normal)   Pulse 91   Temp 98 F (36.7 C) (Oral)   Resp 20   Ht _0  (1.6 m)   Wt 96 lb (43.5 kg)   SpO2 100%   BMI 17.01 kg/m  Wt Readings from Last 3 Encounters:  04/28/21 96 lb (43.5 kg)  04/27/21 96 lb (43.5 kg)  04/26/21 96 lb (43.5 kg)     Health Maintenance Due  Topic Date Due  . COVID-19 Vaccine (1) Never done    There are no preventive care reminders to display for this patient.  Lab Results  Component Value Date   TSH 1.530 03/03/2020   Lab Results  Component Value Date   WBC 6.6 04/27/2021   HGB 9.2 (L) 04/27/2021   HCT 28.2 (L) 04/27/2021   MCV 97 04/27/2021   PLT 320 04/27/2021   Lab Results  Component Value Date   NA 137 04/27/2021   K 4.6 04/27/2021   CO2 33 (H) 04/27/2021   GLUCOSE 94 04/27/2021   BUN 10 04/27/2021   CREATININE 0.96 04/27/2021   BILITOT 0.2 04/27/2021   ALKPHOS 88 04/27/2021   AST 19 04/27/2021   ALT 5 04/27/2021   PROT 7.3 04/27/2021   ALBUMIN 4.2 04/27/2021   CALCIUM 9.8 04/27/2021   EGFR 64 04/27/2021   Lab Results  Component Value Date   CHOL 174 03/03/2020   Lab Results  Component Value Date   HDL 55 03/03/2020   Lab Results  Component Value Date   LDLCALC 105 (H) 03/03/2020   Lab Results  Component Value Date   TRIG 75 03/03/2020   Lab Results  Component Value Date   CHOLHDL 3.2 03/03/2020   No results found for:  HGBA1C    Assessment & Plan:  1g Rocephin diluted with 2.1 mL lidocaine 1% given in RUOQ. Pt tolerated well with no apparent complications.  Problem List Items Addressed This Visit      Genitourinary   Acute cystitis with hematuria - Primary   Relevant Medications   cefTRIAXone (ROCEPHIN) injection 1 g (Start on 04/28/2021  2:00  PM)      Meds ordered this encounter  Medications  . cefTRIAXone (ROCEPHIN) injection 1 g    Follow-up: No follow-ups on file.    Ninfa Meeker, CMA

## 2021-04-28 NOTE — Progress Notes (Signed)
You do not have antibodies against covid meaning you have not had covid.  Kidney function looks great.  Hemoglobin back up some.  Iron low but in normal range. Continue on oral iron.

## 2021-05-10 ENCOUNTER — Telehealth: Payer: Self-pay

## 2021-05-10 NOTE — Telephone Encounter (Signed)
Patient advised.

## 2021-05-10 NOTE — Telephone Encounter (Signed)
[name] called and states she has nasal congestion. She has a cough but it is normal cough. She wanted to know if it would be a good idea to take the prednisone 40 mg for the next 5 days. She has a prescription. Please advise.

## 2021-05-10 NOTE — Telephone Encounter (Signed)
I would avoid taking prednisone unless trouble breathing, worsening productive cough that COPD exacerbation is suspected. Prednisone does lower your immune system.

## 2021-05-17 ENCOUNTER — Ambulatory Visit (INDEPENDENT_AMBULATORY_CARE_PROVIDER_SITE_OTHER): Payer: Medicare Other | Admitting: Physician Assistant

## 2021-05-17 ENCOUNTER — Other Ambulatory Visit (HOSPITAL_COMMUNITY): Payer: Self-pay | Admitting: Psychiatry

## 2021-05-17 ENCOUNTER — Other Ambulatory Visit: Payer: Self-pay | Admitting: Psychiatry

## 2021-05-17 DIAGNOSIS — Z0000 Encounter for general adult medical examination without abnormal findings: Secondary | ICD-10-CM | POA: Diagnosis not present

## 2021-05-17 MED ORDER — CLONAZEPAM 0.5 MG PO TABS: 0.5000 mg | ORAL_TABLET | Freq: Three times a day (TID) | ORAL | 0 refills | Status: DC

## 2021-05-17 NOTE — Patient Instructions (Signed)
MEDICARE ANNUAL WELLNESS VISIT Health Maintenance Summary and Written Plan of Care  Claire [name] ,  Thank you for allowing me to perform your Medicare Annual Wellness Visit and for your ongoing commitment to your health.   Health Maintenance & Immunization History Health Maintenance  Topic Date Due   COVID-19 Vaccine (1) 06/02/2021 (Originally 01/04/1956)   MAMMOGRAM  07/09/2021 (Originally 01/03/2001)   DEXA SCAN  07/09/2021 (Originally 01/04/2016)   Hepatitis C Screening  07/09/2021 (Originally 01/03/1969)   PNA vac Low Risk Adult (2 of 2 - PCV13) 07/09/2021 (Originally 11/06/2019)   COLON CANCER SCREENING ANNUAL FOBT  07/19/2021 (Originally 07/10/2018)   Zoster Vaccines- Shingrix (1 of 2) 08/17/2021 (Originally 01/03/1970)   TETANUS/TDAP  01/19/2022 (Originally 12/05/2020)   COLONOSCOPY (Pts 45-30yrs Insurance coverage will need to be confirmed)  05/17/2022 (Originally 01/04/1996)   INFLUENZA VACCINE  07/05/2021   HPV VACCINES  Aged Out   Immunization History  Administered Date(s) Administered   Influenza,inj,Quad PF,6+ Mos 09/26/2018   Pneumococcal Polysaccharide-23 11/05/2018   Tdap 12/05/2010    These are the patient goals that we discussed:  Goals Addressed               This Visit's Progress     Patient Stated (pt-stated)        05/17/2021 AWV Goal: Exercise for General Health  Patient will verbalize understanding of the benefits of increased physical activity: Exercising regularly is important. It will improve your overall fitness, flexibility, and endurance. Regular exercise also will improve your overall health. It can help you control your weight, reduce stress, and improve your bone density. Over the next year, patient will increase physical activity as tolerated with a goal of at least 150 minutes of moderate physical activity per week.  You can tell that you are exercising at a moderate intensity if your heart starts beating faster and you start breathing faster but can  still hold a conversation. Moderate-intensity exercise ideas include: Walking 1 mile (1.6 km) in about 15 minutes Biking Hiking Golfing Dancing Water aerobics Patient will verbalize understanding of everyday activities that increase physical activity by providing examples like the following: Yard work, such as: Insurance underwriter Gardening Washing windows or floors Patient will be able to explain general safety guidelines for exercising:  Before you start a new exercise program, talk with your health care provider. Do not exercise so much that you hurt yourself, feel dizzy, or get very short of breath. Wear comfortable clothes and wear shoes with good support. Drink plenty of water while you exercise to prevent dehydration or heat stroke. Work out until your breathing and your heartbeat get faster.           This is a list of Health Maintenance Items that are overdue or due now: Pneumococcal vaccine  Influenza vaccine Screening mammography Bone densitometry screening Colorectal cancer screening  Orders/Referrals Placed Today: No orders of the defined types were placed in this encounter.  (Contact our referral department at [phone] if you have not spoken with someone about your referral appointment within the next 5 days)    Follow-up Plan Follow-up with Jomarie Longs, PA-C as planned 2nd Pneumonia vaccine can be done at next in-office visit. Medicare wellness visit in one year. Patient will access AVS on mychart.    Health Maintenance, Female Adopting a healthy lifestyle and getting preventive care are important in promoting health and wellness. Ask your  health care provider about: The right schedule for you to have regular tests and exams. Things you can do on your own to prevent diseases and keep yourself healthy. What should I know about diet, weight, and exercise? Eat a  healthy diet  Eat a diet that includes plenty of vegetables, fruits, low-fat dairy products, and lean protein. Do not eat a lot of foods that are high in solid fats, added sugars, or sodium.  Maintain a healthy weight Body mass index (BMI) is used to identify weight problems. It estimates body fat based on height and weight. Your health care provider can help determineyour BMI and help you achieve or maintain a healthy weight. Get regular exercise Get regular exercise. This is one of the most important things you can do for your health. Most adults should: Exercise for at least 150 minutes each week. The exercise should increase your heart rate and make you sweat (moderate-intensity exercise). Do strengthening exercises at least twice a week. This is in addition to the moderate-intensity exercise. Spend less time sitting. Even light physical activity can be beneficial. Watch cholesterol and blood lipids Have your blood tested for lipids and cholesterol at 70 years of age, then havethis test every 5 years. Have your cholesterol levels checked more often if: Your lipid or cholesterol levels are high. You are older than 70 years of age. You are at high risk for heart disease. What should I know about cancer screening? Depending on your health history and family history, you may need to have cancer screening at various ages. This may include screening for: Breast cancer. Cervical cancer. Colorectal cancer. Skin cancer. Lung cancer. What should I know about heart disease, diabetes, and high blood pressure? Blood pressure and heart disease High blood pressure causes heart disease and increases the risk of stroke. This is more likely to develop in people who have high blood pressure readings, are of African descent, or are overweight. Have your blood pressure checked: Every 3-5 years if you are 73-2 years of age. Every year if you are 48 years old or older. Diabetes Have regular diabetes  screenings. This checks your fasting blood sugar level. Have the screening done: Once every three years after age 10 if you are at a normal weight and have a low risk for diabetes. More often and at a younger age if you are overweight or have a high risk for diabetes. What should I know about preventing infection? Hepatitis B If you have a higher risk for hepatitis B, you should be screened for this virus. Talk with your health care provider to find out if you are at risk forhepatitis B infection. Hepatitis C Testing is recommended for: Everyone born from [birth year] through 1965. Anyone with known risk factors for hepatitis C. Sexually transmitted infections (STIs) Get screened for STIs, including gonorrhea and chlamydia, if: You are sexually active and are younger than 70 years of age. You are older than 70 years of age and your health care provider tells you that you are at risk for this type of infection. Your sexual activity has changed since you were last screened, and you are at increased risk for chlamydia or gonorrhea. Ask your health care provider if you are at risk. Ask your health care provider about whether you are at high risk for HIV. Your health care provider may recommend a prescription medicine to help prevent HIV infection. If you choose to take medicine to prevent HIV, you should first get tested for HIV.  You should then be tested every 3 months for as long as you are taking the medicine. Pregnancy If you are about to stop having your period (premenopausal) and you may become pregnant, seek counseling before you get pregnant. Take 400 to 800 micrograms (mcg) of folic acid every day if you become pregnant. Ask for birth control (contraception) if you want to prevent pregnancy. Osteoporosis and menopause Osteoporosis is a disease in which the bones lose minerals and strength with aging. This can result in bone fractures. If you are 23 years old or older, or if you are at risk for  osteoporosis and fractures, ask your health care provider if you should: Be screened for bone loss. Take a calcium or vitamin D supplement to lower your risk of fractures. Be given hormone replacement therapy (HRT) to treat symptoms of menopause. Follow these instructions at home: Lifestyle Do not use any products that contain nicotine or tobacco, such as cigarettes, e-cigarettes, and chewing tobacco. If you need help quitting, ask your health care provider. Do not use street drugs. Do not share needles. Ask your health care provider for help if you need support or information about quitting drugs. Alcohol use Do not drink alcohol if: Your health care provider tells you not to drink. You are pregnant, may be pregnant, or are planning to become pregnant. If you drink alcohol: Limit how much you use to 0-1 drink a day. Limit intake if you are breastfeeding. Be aware of how much alcohol is in your drink. In the [place] one drink equals one 12 oz bottle of beer (355 mL), one 5 oz glass of wine (148 mL), or one 1 oz glass of hard liquor (44 mL). General instructions Schedule regular health, dental, and eye exams. Stay current with your vaccines. Tell your health care provider if: You often feel depressed. You have ever been abused or do not feel safe at home. Summary Adopting a healthy lifestyle and getting preventive care are important in promoting health and wellness. Follow your health care provider's instructions about healthy diet, exercising, and getting tested or screened for diseases. Follow your health care provider's instructions on monitoring your cholesterol and blood pressure. This information is not intended to replace advice given to you by your health care provider. Make sure you discuss any questions you have with your healthcare provider. Document Revised: 11/14/2018 Document Reviewed: 11/14/2018 Elsevier Patient Education  2022 [employer].

## 2021-05-17 NOTE — Telephone Encounter (Signed)
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 [facility] on [address] in [place].

## 2021-05-17 NOTE — Telephone Encounter (Signed)
Order for a month only. 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   I have utilized the [place] Controlled Substances Reporting System (PMP AWARxE) to confirm adherence regarding the patient's medication. My review reveals appropriate prescription fills.

## 2021-05-17 NOTE — Progress Notes (Signed)
MEDICARE ANNUAL WELLNESS VISIT  05/17/2021  Telephone Visit Disclaimer This Medicare AWV was conducted by telephone due to national recommendations for restrictions regarding the COVID-19 Pandemic (e.g. social distancing).  I verified, using two identifiers, that I am speaking with Claire [name] or their authorized healthcare agent. I discussed the limitations, risks, security, and privacy concerns of performing an evaluation and management service by telephone and the potential availability of an in-person appointment in the future. The patient expressed understanding and agreed to proceed.  Location of Patient: Home Location of Provider (nurse):  In the office.  Subjective:    Claire [name] is a 70 y.o. female patient of Claire [name], Claire Cobb, PA-C who had a [employer] Visit today via telephone. Shon is Retired and lives alone. she has 2 children. she reports that she is socially active and does not interact with friends/family regularly. she is minimally physically active and enjoys sewing and painting.  Patient Care Team: Nolene Ebbs as PCP - General (Family Medicine)  Advanced Directives 05/17/2021 06/03/2019  Does Patient Have a Medical Advance Directive? No No  Does patient want to make changes to medical advance directive? - No - Patient declined  Would patient like information on creating a medical advance directive? No - Patient declined No - Patient declined    Hospital Utilization Over the Past 12 Months: # of hospitalizations or ER visits: 1 # of surgeries: 0  Review of Systems    Patient reports that her overall health is unchanged compared to last year.  History obtained from chart review and the patient  Patient Reported Readings (BP, Pulse, CBG, Weight, etc) none  Pain Assessment Pain : No/denies pain     Current Medications & Allergies (verified) Allergies as of 05/17/2021       Reactions   Bactrim [sulfamethoxazole-trimethoprim]     ACUTE RENAL fAILURE/HYPONATREMIA/HYPERKALEMIA   Macrodantin [nitrofurantoin Macrocrystal] Rash   Trimethoprim    Fatigue/weakness/no appetite   Zithromax [azithromycin]    diarrhea        Medication List        Accurate as of May 17, 2021  2:47 PM. If you have any questions, ask your nurse or doctor.          albuterol (2.5 MG/3ML) 0.083% nebulizer solution Commonly known as: PROVENTIL 1 VIAL IN NEBULIZER EVERY 4 HOURS AS NEEDED WHEEZING OR FOR SHORTNESS OF BREATH   albuterol 108 (90 Base) MCG/ACT inhaler Commonly known as: ProAir HFA INHALE 1-2 PUFFS INTO THE LUNGS EVERY 4 (FOUR) HOURS AS NEEDED FOR WHEEZING OR SHORTNESS OF BREATH.   AMBULATORY NON FORMULARY MEDICATION Continuous stationary and portable oxygen tanks for use with ambulation. DX: COPD with hypoxia.   AMBULATORY NON FORMULARY MEDICATION Portable oxygen concentrator DX: COPD with hypoxia.   atenolol 25 MG tablet Commonly known as: TENORMIN Take 1 tablet (25 mg total) by mouth daily.   Azelastine HCl 0.15 % Soln Commonly known as: Astepro Place 2 sprays into the nose daily.   cetirizine 10 MG chewable tablet Commonly known as: ZYRTEC Chew 10 mg by mouth daily.   cholecalciferol 25 MCG (1000 UNIT) tablet Commonly known as: VITAMIN D Take 1,000 Units by mouth daily.   clonazePAM 0.5 MG tablet Commonly known as: KLONOPIN TAKE ONE TABLET BY MOUTH THREE TIMES DAILY   dicyclomine 10 MG capsule Commonly known as: BENTYL Take 1 capsule (10 mg total) by mouth 3 (three) times daily before meals.   Dulera 100-5 MCG/ACT Aero  Generic drug: mometasone-formoterol TAKE 2 PUFFS BY MOUTH TWICE A DAY   escitalopram 10 MG tablet Commonly known as: LEXAPRO Take 1/2 tablet for 7 days then increase to one tablet daily.   estradiol 0.1 MG/GM vaginal cream Commonly known as: ESTRACE VAGINAL Place 1 Applicatorful vaginally 3 (three) times a week. And one pea-size amt applied to urethral opening.   famotidine  20 MG tablet Commonly known as: PEPCID TAKE 1 TABLET BY MOUTH TWICE A DAY What changed:  when to take this reasons to take this   famotidine 20 MG tablet Commonly known as: PEPCID Take by mouth. What changed: Another medication with the same name was changed. Make sure you understand how and when to take each.   ferrous sulfate 325 (65 FE) MG EC tablet 1 tab PO TID every other day   Florajen Acidophilus Caps Take 1 capsule by mouth daily.   levothyroxine 75 MCG tablet Commonly known as: SYNTHROID TAKE 1 TABLET BY MOUTH EVERY DAY   pantoprazole 40 MG tablet Commonly known as: PROTONIX Take 1 tablet (40 mg total) by mouth daily.   saccharomyces boulardii 250 MG capsule Commonly known as: FLORASTOR Take by mouth.   Spiriva Respimat 2.5 MCG/ACT Aers Generic drug: Tiotropium Bromide Monohydrate Inhale 2 puffs into the lungs daily.        History (reviewed): Past Medical History:  Diagnosis Date   Acid reflux    Anxiety    High blood pressure    High serum parathyroid hormone (PTH) 07/19/2017   96 06/28/2014, 162 12/29/16   Hypothyroid    PTSD (post-traumatic stress disorder)    History reviewed. No pertinent surgical history. 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 She enjoys sewing and painting.   Social Determinants of Health   Financial Resource Strain: Low Risk    Difficulty of Paying Living Expenses: Not hard at all  Food Insecurity: No Food Insecurity   Worried About Programme researcher, broadcasting/film/video in the Last Year: Never true   Ran Out of Food in the Last Year: Never true  Transportation Needs: No Transportation Needs   Lack of Transportation (Medical): No   Lack of Transportation (Non-Medical): No  Physical Activity: Inactive   Days of Exercise per Week: 0 days   Minutes of Exercise per Session: 0 min  Stress: No Stress Concern Present   Feeling of Stress : Not at all  Social Connections: Socially Isolated   Frequency of Communication with Friends and Family: More than three times a week   Frequency of Social Gatherings  with Friends and Family: Never   Attends Religious Services: Never   Database administrator or Organizations: No   Attends Banker Meetings: Never   Marital Status: Divorced    Activities of Daily Living In your present state of health, do you have any difficulty performing the following activities: 05/17/2021  Hearing? N  Vision? N  Difficulty concentrating or making decisions? N  Walking or climbing stairs? Y  Comment she has shortness of breath with climbing stairs  Dressing or bathing? N  Doing errands, shopping? Y  Comment her daughter helps with the errands.  Preparing Food and eating ? N  Using the Toilet? N  In the past six months, have you accidently leaked urine? N  Do you have problems with loss of bowel control? N  Managing your Medications? N  Managing your Finances? N  Housekeeping or managing your Housekeeping? Y  Comment due to her COPD, she gets short of breath.  Some recent data might be hidden    Patient Education/ Literacy How often do you need to have someone help you when you read instructions, pamphlets, or other written  materials from your doctor or pharmacy?: 1 - Never What is the last grade level you completed in school?: Associates degree  Exercise Current Exercise Habits: The patient does not participate in regular exercise at present, Exercise limited by: respiratory conditions(s)  Diet Patient reports consuming 2 meals a day and 3 snack(s) a day Patient reports that her primary diet is: Regular Patient reports that she does have regular access to food.   Depression Screen PHQ 2/9 Scores 05/17/2021 11/02/2020 06/03/2019 09/12/2018 09/26/2017  PHQ - 2 Score 3 1 1 1  0  PHQ- 9 Score 3 2 - 3 -  Some encounter information is confidential and restricted. Go to Review Flowsheets activity to see all data.     Fall Risk Fall Risk  05/17/2021 11/02/2020 06/03/2019  Falls in the past year? 0 0 0  Number falls in past yr: 0 0 -  Injury with Fall? 0 0 -  Risk for fall due to : No Fall Risks - -  Follow up Falls evaluation completed Falls evaluation completed Falls prevention discussed     Objective:  MAKAIYA GEERDES seemed alert and oriented and she participated appropriately during our telephone visit.  Blood Pressure Weight BMI  BP Readings from Last 3 Encounters:  04/28/21 110/68  04/27/21 130/77  04/26/21 125/71   Wt Readings from Last 3 Encounters:  04/28/21 96 lb (43.5 kg)  04/27/21 96 lb (43.5 kg)  04/26/21 96 lb (43.5 kg)   BMI Readings from Last 1 Encounters:  04/28/21 17.01 kg/m    *Unable to obtain current vital signs, weight, and BMI due to telephone visit type  Hearing/Vision  Avangeline did not seem to have difficulty with hearing/understanding during the telephone conversation Reports that she has not had a formal eye exam by an eye care professional within the past year Reports that she has not had a formal hearing evaluation within the past year *Unable to fully assess hearing and vision during telephone visit type  Cognitive Function: 6CIT Screen 05/17/2021 06/03/2019  What Year? 0  points 0 points  What month? 0 points 0 points  What time? 0 points 0 points  Count back from 20 0 points 0 points  Months in reverse 0 points 2 points  Repeat phrase 0 points -  Total Score 0 -   (Normal:0-7, Significant for Dysfunction: >  8)  Normal Cognitive Function Screening: Yes   Immunization & Health Maintenance Record Immunization History  Administered Date(s) Administered   Influenza,inj,Quad PF,6+ Mos 09/26/2018   Pneumococcal Polysaccharide-23 11/05/2018   Tdap 12/05/2010    Health Maintenance  Topic Date Due   COVID-19 Vaccine (1) 06/02/2021 (Originally 01/04/1956)   MAMMOGRAM  07/09/2021 (Originally 01/03/2001)   DEXA SCAN  07/09/2021 (Originally 01/04/2016)   Hepatitis C Screening  07/09/2021 (Originally [birth year]03/1969)   PNA vac Low Risk Adult (2 of 2 - PCV13) 07/09/2021 (Originally 11/06/2019)   COLON CANCER SCREENING ANNUAL FOBT  07/19/2021 (Originally 07/10/2018)   Zoster Vaccines- Shingrix (1 of 2) 08/17/2021 (Originally 01/03/1970)   TETANUS/TDAP  01/19/2022 (Originally 12/05/2020)   COLONOSCOPY (Pts [id number] Insurance coverage will need to be confirmed)  05/17/2022 (Originally 01/04/1996)   INFLUENZA VACCINE  07/05/2021   HPV VACCINES  Aged Out       Assessment  This is a routine wellness examination for Starbucks CorporationPamela L Barriere.  Health Maintenance: Due or Overdue There are no preventive care reminders to display for this patient.   Claire BestPamela L Vanderveer does not need a referral for Community Assistance: Care Management:   no Social Work:    no Prescription Assistance:  no Nutrition/Diabetes Education:  no   Plan:  Personalized Goals  Goals Addressed               This Visit's Progress     Patient Stated (pt-stated)        05/17/2021 AWV Goal: Exercise for General Health  Patient will verbalize understanding of the benefits of increased physical activity: Exercising regularly is important. It will improve your overall fitness, flexibility, and endurance. Regular  exercise also will improve your overall health. It can help you control your weight, reduce stress, and improve your bone density. Over the next year, patient will increase physical activity as tolerated with a goal of at least 150 minutes of moderate physical activity per week.  You can tell that you are exercising at a moderate intensity if your heart starts beating faster and you start breathing faster but can still hold a conversation. Moderate-intensity exercise ideas include: Walking 1 mile (1.6 km) in about 15 minutes Biking Hiking Golfing Dancing Water aerobics Patient will verbalize understanding of everyday activities that increase physical activity by providing examples like the following: Yard work, such as: Insurance underwriterushing a lawn mower Raking and bagging leaves Washing your car Pushing a stroller Shoveling snow Gardening Washing windows or floors Patient will be able to explain general safety guidelines for exercising:  Before you start a new exercise program, talk with your health care provider. Do not exercise so much that you hurt yourself, feel dizzy, or get very short of breath. Wear comfortable clothes and wear shoes with good support. Drink plenty of water while you exercise to prevent dehydration or heat stroke. Work out until your breathing and your heartbeat get faster.         Personalized Health Maintenance & Screening Recommendations  Pneumococcal vaccine  Influenza vaccine Screening mammography Bone densitometry screening Colorectal cancer screening Tetanus shot Shingles vaccine Covid vaccine  Patient declined the vaccines other than the second pneumonia vaccine. She has the cologuard at home and she will send the sample soon.  Patient declined mammogram and bone density at this time.  Lung Cancer Screening Recommended: no (Low Dose CT Chest recommended if Age 50-80 years, 30 pack-year currently smoking OR have quit w/in past 15 years) Hepatitis C  Screening recommended: no  HIV Screening recommended: no  Advanced Directives: Written information was not prepared per patient's request.  Referrals & Orders No orders of the defined types were placed in this encounter.   Follow-up Plan Follow-up with Jomarie Longs, PA-C as planned 2nd Pneumonia vaccine can be done at next in-office visit. Medicare wellness visit in one year. Patient will access AVS on mychart.   I have personally reviewed and noted the following in the patient's chart:   Medical and social history Use of alcohol, tobacco or illicit drugs  Current medications and supplements Functional ability and status Nutritional status Physical activity Advanced directives List of other physicians Hospitalizations, surgeries, and ER visits in previous 12 months Vitals Screenings to include cognitive, depression, and falls Referrals and appointments  In addition, I have reviewed and discussed with Claire [name] certain preventive protocols, quality metrics, and [name] practice recommendations. A written personalized care plan for preventive services as well as general preventive health recommendations is available and can be mailed to the patient at her request.      Modesto Charon  05/17/2021

## 2021-05-20 ENCOUNTER — Other Ambulatory Visit: Payer: Self-pay | Admitting: Physician Assistant

## 2021-05-20 DIAGNOSIS — R1013 Epigastric pain: Secondary | ICD-10-CM

## 2021-05-20 DIAGNOSIS — R110 Nausea: Secondary | ICD-10-CM

## 2021-05-26 ENCOUNTER — Telehealth (INDEPENDENT_AMBULATORY_CARE_PROVIDER_SITE_OTHER): Payer: Medicare Other | Admitting: Physician Assistant

## 2021-05-26 ENCOUNTER — Telehealth: Payer: Medicare Other | Admitting: Physician Assistant

## 2021-05-26 ENCOUNTER — Encounter: Payer: Self-pay | Admitting: Physician Assistant

## 2021-05-26 DIAGNOSIS — J9611 Chronic respiratory failure with hypoxia: Secondary | ICD-10-CM | POA: Diagnosis not present

## 2021-05-26 DIAGNOSIS — N1831 Chronic kidney disease, stage 3a: Secondary | ICD-10-CM

## 2021-05-26 DIAGNOSIS — J432 Centrilobular emphysema: Secondary | ICD-10-CM | POA: Diagnosis not present

## 2021-05-26 MED ORDER — AMBULATORY NON FORMULARY MEDICATION: 0 refills | Status: DC

## 2021-05-26 NOTE — Progress Notes (Signed)
Patient ID: Claire [name], female   DOB: [birth year], 70 y.o.   MRN: [id number] .[name] KitchenVirtual Visit via Telephone Note  I connected with Claire [name] on 05/26/21 at  7:30 AM EDT by telephone and verified that I am speaking with the correct person using two identifiers.  Location: Patient: home Provider: clinic  .[name] KitchenParticipating in visit:  Patient: Claire [name] Provider: Tandy Gaw PA-C   I discussed the limitations, risks, security and privacy concerns of performing an evaluation and management service by telephone and the availability of in person appointments. I also discussed with the patient that there may be a patient responsible charge related to this service. The patient expressed understanding and agreed to proceed.   History of Present Illness: Pt is a 70 yo female with severe COPD and chronic respiratory failure, CKD 3 who calls into the clinic to discuss medications.   She has been using her albuterol inhaler more than directed and running out too soon. With humidity she feels SOB more often. She could not give me a clear indication of how often she was using her albuterol. She reports she is taking spiriva and dulera daily. She was just approved for enogen portable oxygen. Not able to check O2 stats due to broke device.   .. Active Ambulatory Problems    Diagnosis Date Noted   PTSD (post-traumatic stress disorder) 11/10/2011   Neurosis, anxiety, panic type 11/10/2011   Acquired hypothyroidism 11/24/2014   BP (high blood pressure) 07/24/2012   Chronic use of benzodiazepine for therapeutic purpose 08/11/2015   Hyponatremia 12/19/2016   Hyperkalemia 12/19/2016   Centrilobular emphysema (HCC) 12/20/2016   Stage 3a chronic kidney disease (HCC) 01/16/2017   Iron deficiency anemia 01/16/2017   Anemia of chronic disease 05/11/2017   Low serum vitamin B12 05/14/2017   Vitamin D deficiency 05/14/2017   Depressed mood 05/14/2017   High serum parathyroid hormone (PTH) 07/19/2017    Normocytic anemia 07/19/2017   DOE (dyspnea on exertion) 05/09/2018   Recurrent UTI 05/07/2019   Leg cramps 05/29/2019   No energy 05/29/2019   History of hypokalemia 10/09/2019   Vaccine counseling 12/24/2019   Menopausal vaginal dryness 12/24/2019   Chronic rhinitis 01/31/2020   Chronic respiratory failure with hypoxia (HCC) 01/31/2020   Unintended weight loss 02/19/2020   Macrocytic anemia 03/10/2020   Elevated ferritin 03/10/2020   Irritable bowel syndrome with diarrhea 03/10/2020   Anxiety about health 03/23/2020   Pernicious anemia 03/23/2020   Malnutrition of mild degree (HCC) 05/06/2020   Body mass index (BMI) of 19 or less in adult 05/08/2020   Constipation 07/13/2020   Lower extremity edema 07/29/2020   Clostridium difficile colitis 09/11/2020   Mild protein-calorie malnutrition (HCC) 01/19/2021   Underweight 01/22/2021   History of Clostridioides difficile colitis 02/22/2021   Medication management 03/15/2021   Cloudy urine 04/23/2021   Stage 3b chronic kidney disease (HCC) 04/23/2021   Acute cystitis with hematuria 04/26/2021   Resolved Ambulatory Problems    Diagnosis Date Noted   Hypothyroid    Acute renal failure (HCC) 12/19/2016   Decreased hemoglobin 12/20/2016   Acute kidney injury (HCC) 03/17/2017   Sinobronchitis 06/16/2020   Past Medical History:  Diagnosis Date   Acid reflux    Anxiety    High blood pressure       Observations/Objective: No acute distress Normal breathing, not labored.    [name].There were no vitals filed for this visit. There is no height or weight on file to calculate BMI.  Assessment and Plan: [name] KitchenMarland KitchenDiagnoses and all orders for this visit:  Centrilobular emphysema (HCC) -     AMBULATORY NON FORMULARY MEDICATION; One spacer to use with inhaler for better delivery of medication.  Stage 3a chronic kidney disease (HCC)  Chronic respiratory failure with hypoxia (HCC) -     AMBULATORY NON FORMULARY MEDICATION; One spacer to  use with inhaler for better delivery of medication.   Continues to smoke. Not willing to quit.  Discussed to use nebulizer a little more during exacerbations instead of inhaler.  Inhaler no more than 2 puffs every 4 hours as needed.  Spacer sent for inhaler I do not think she is getting all the medication and thus using too much.   Anxiety could be causing some SOB sensation. Asked patient to consider deep breathing exercises and waiting period before pulling out inhaler for rescue.    Follow Up Instructions:    I discussed the assessment and treatment plan with the patient. The patient was provided an opportunity to ask questions and all were answered. The patient agreed with the plan and demonstrated an understanding of the instructions.   The patient was advised to call back or seek an in-person evaluation if the symptoms worsen or if the condition fails to improve as anticipated.  I provided 30 minutes of non-face-to-face time during this encounter.   Tandy Gaw, PA-C

## 2021-05-27 ENCOUNTER — Encounter: Payer: Self-pay | Admitting: Family Medicine

## 2021-05-27 ENCOUNTER — Ambulatory Visit (INDEPENDENT_AMBULATORY_CARE_PROVIDER_SITE_OTHER): Payer: Medicare Other | Admitting: Family Medicine

## 2021-05-27 ENCOUNTER — Other Ambulatory Visit: Payer: Self-pay

## 2021-05-27 VITALS — BP 107/67 | HR 85 | Temp 97.6°F | Resp 19 | Wt 92.0 lb

## 2021-05-27 DIAGNOSIS — Z87440 Personal history of urinary (tract) infections: Secondary | ICD-10-CM | POA: Diagnosis not present

## 2021-05-27 DIAGNOSIS — R5383 Other fatigue: Secondary | ICD-10-CM | POA: Diagnosis not present

## 2021-05-27 LAB — POCT URINALYSIS DIP (CLINITEK)
Bilirubin, UA: NEGATIVE
Blood, UA: NEGATIVE
Glucose, UA: NEGATIVE mg/dL
Ketones, POC UA: NEGATIVE mg/dL
Nitrite, UA: NEGATIVE
POC PROTEIN,UA: NEGATIVE
Spec Grav, UA: 1.01 (ref 1.010–1.025)
Urobilinogen, UA: 0.2 E.U./dL
pH, UA: 6 (ref 5.0–8.0)

## 2021-05-27 NOTE — Progress Notes (Signed)
Acute Office Visit  Subjective:    Patient ID: Claire [name], female    DOB: 12/19/1950, 70 y.o.   MRN: [id number]  Chief Complaint  Patient presents with   Fatigue    HPI Patient is in today for fatigue, possible UTI.  Patient reports feeling "off" for the past few days, specifically on Tuesday and Wednesday when it was so hot outside. Reports she is tired and "feels funny" and is afraid because she has had frequent UTIs in the past. Vague description overall. Denies dysuria, pain, discharge, n/v/d, fever/chills, HA, chest pain, difficulty breathing, urgency, frequency, blood in stool or urine. She does report approximately 25 pound weight loss over the past year - states this is related to IBS flares and being a picky eater.       Past Medical History:  Diagnosis Date   Acid reflux    Anxiety    High blood pressure    High serum parathyroid hormone (PTH) 07/19/2017   96 06/28/2014, 162 12/29/16   Hypothyroid    PTSD (post-traumatic stress disorder)     History reviewed. No pertinent surgical history.  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 She enjoys sewing and painting.   Social Determinants of Health    Financial Resource Strain: Low Risk    Difficulty of Paying Living Expenses: Not hard at all  Food Insecurity: No Food Insecurity   Worried About Charity fundraiser in the Last Year: Never true   [place] in the Last Year: Never true  Transportation Needs: No Transportation Needs   Lack of Transportation (Medical): No   Lack of Transportation (Non-Medical): No  Physical Activity: Inactive   Days of Exercise per Week: 0 days   Minutes of Exercise per Session: 0 min  Stress: No Stress Concern Present   Feeling of Stress : Not at all  Social Connections: Socially Isolated   Frequency of Communication with Friends and Family: More than three times a week   Frequency of Social Gatherings with Friends and Family: Never   Attends Religious Services: Never   Marine scientist or Organizations: No   Attends Music therapist: Never   Marital Status: Divorced  Human resources officer Violence: Not At Risk   Fear of Current or Ex-Partner: No   Emotionally Abused: No   Physically Abused: No   Sexually Abused: No    Outpatient Medications Prior to Visit  Medication Sig Dispense Refill   albuterol (PROAIR HFA) 108 (90 Base) MCG/ACT inhaler INHALE 1-2  PUFFS INTO THE LUNGS EVERY 4 (FOUR) HOURS AS NEEDED FOR WHEEZING OR SHORTNESS OF BREATH. 54 g 1   albuterol (PROVENTIL) (2.5 MG/3ML) 0.083% nebulizer solution 1 VIAL IN NEBULIZER EVERY 4 HOURS AS NEEDED WHEEZING OR FOR SHORTNESS OF BREATH 75 mL 6   AMBULATORY NON FORMULARY MEDICATION Continuous stationary and portable oxygen tanks for use with ambulation. DX: COPD with hypoxia. 4 Device 11   AMBULATORY NON FORMULARY MEDICATION Portable oxygen concentrator DX: COPD with hypoxia. 1 each prn   AMBULATORY NON FORMULARY MEDICATION One spacer to use with inhaler for better delivery of medication. 1 Device 0   atenolol (TENORMIN) 25 MG tablet Take 1 tablet (25 mg total) by mouth daily. 90 tablet 3   Azelastine HCl (ASTEPRO) 0.15 %  SOLN Place 2 sprays into the nose daily. 30 mL 2   cetirizine (ZYRTEC) 10 MG chewable tablet Chew 10 mg by mouth daily.     cholecalciferol (VITAMIN D) 25 MCG (1000 UT) tablet Take 1,000 Units by mouth daily.     clonazePAM (KLONOPIN) 0.5 MG tablet Take 1 tablet (0.5 mg total) by mouth 3 (three) times daily. 90 tablet 0   dicyclomine (BENTYL) 10 MG capsule Take 1 capsule (10 mg total) by mouth 3 (three) times daily before meals. 270 capsule 2   estradiol (ESTRACE VAGINAL) 0.1 MG/GM vaginal cream Place 1 Applicatorful vaginally 3 (three) times a week. And one pea-size amt applied to urethral opening. 42.5 g 1   famotidine (PEPCID) 20 MG tablet TAKE 1 TABLET BY MOUTH TWICE A DAY (Patient taking differently: Take 20 mg by mouth daily as needed.) 180 tablet 1   famotidine (PEPCID) 20 MG tablet Take by mouth.     ferrous sulfate 325 (65 FE) MG EC tablet 1 tab PO TID every other day 90 tablet 11   Lactobacillus (FLORAJEN ACIDOPHILUS) CAPS Take 1 capsule by mouth daily.     levothyroxine (SYNTHROID) 75 MCG tablet TAKE 1 TABLET BY MOUTH EVERY DAY 90 tablet 0   mometasone-formoterol (DULERA) 100-5 MCG/ACT AERO TAKE 2 PUFFS BY MOUTH TWICE A DAY 13 each 0   pantoprazole (PROTONIX) 40 MG tablet Take 1 tablet (40 mg total) by mouth daily. 30 tablet 5   saccharomyces boulardii (FLORASTOR) 250 MG capsule Take by mouth.     Tiotropium Bromide Monohydrate (SPIRIVA RESPIMAT) 2.5 MCG/ACT AERS Inhale 2 puffs into the lungs daily. 12 g 1   No facility-administered medications prior to visit.    Allergies  Allergen Reactions   Bactrim [Sulfamethoxazole-Trimethoprim]     ACUTE RENAL fAILURE/HYPONATREMIA/HYPERKALEMIA   Macrodantin [Nitrofurantoin Macrocrystal] Rash   Trimethoprim     Fatigue/weakness/no appetite   Zithromax [Azithromycin]     diarrhea    Review of Systems All review of systems negative except what is listed in the HPI     Objective:    Physical Exam Vitals reviewed.  Constitutional:       General: She is not in acute distress.    Appearance: Normal appearance.  HENT:     Right Ear: Tympanic membrane normal.     Left Ear: Tympanic membrane normal.     Mouth/Throat:     Mouth: Mucous membranes are moist.     Pharynx: Oropharynx is clear.  Cardiovascular:     Rate and Rhythm: Normal rate and regular rhythm.     Pulses: Normal pulses.     Heart sounds: Normal heart sounds.  Pulmonary:     Effort: Pulmonary effort is normal.  Breath sounds: Normal breath sounds.  Abdominal:     General: Bowel sounds are normal. There is no distension.     Palpations: Abdomen is soft.     Tenderness: There is no right CVA tenderness, left CVA tenderness or guarding.  Musculoskeletal:     Cervical back: Normal range of motion and neck supple.  Lymphadenopathy:     Cervical: No cervical adenopathy.  Skin:    General: Skin is warm and dry.  Neurological:     Mental Status: She is alert and oriented to person, place, and time.  Psychiatric:        Mood and Affect: Mood normal.        Behavior: Behavior normal.        Thought Content: Thought content normal.        Judgment: Judgment normal.   BP 107/67   Pulse 85   Temp 97.6 F (36.4 C)   Resp 19   Wt 92 lb (41.7 kg)   SpO2 100% Comment: 3L  BMI 16.30 kg/m  Wt Readings from Last 3 Encounters:  05/27/21 92 lb (41.7 kg)  04/28/21 96 lb (43.5 kg)  04/27/21 96 lb (43.5 kg)    There are no preventive care reminders to display for this patient.  There are no preventive care reminders to display for this patient.   Lab Results  Component Value Date   TSH 1.530 03/03/2020   Lab Results  Component Value Date   WBC 6.6 04/27/2021   HGB 9.2 (L) 04/27/2021   HCT 28.2 (L) 04/27/2021   MCV 97 04/27/2021   PLT 320 04/27/2021   Lab Results  Component Value Date   NA 137 04/27/2021   K 4.6 04/27/2021   CO2 33 (H) 04/27/2021   GLUCOSE 94 04/27/2021   BUN 10 04/27/2021   CREATININE 0.96 04/27/2021   BILITOT 0.2  04/27/2021   ALKPHOS 88 04/27/2021   AST 19 04/27/2021   ALT 5 04/27/2021   PROT 7.3 04/27/2021   ALBUMIN 4.2 04/27/2021   CALCIUM 9.8 04/27/2021   EGFR 64 04/27/2021   Lab Results  Component Value Date   CHOL 174 03/03/2020   Lab Results  Component Value Date   HDL 55 03/03/2020   Lab Results  Component Value Date   LDLCALC 105 (H) 03/03/2020   Lab Results  Component Value Date   TRIG 75 03/03/2020   Lab Results  Component Value Date   CHOLHDL 3.2 03/03/2020   No results found for: HGBA1C     Assessment & Plan:   1. Fatigue, unspecified type 2. Hx: UTI (urinary tract infection) Vague symptoms today. Will go ahead and check basic labs and urine as she has history of UTI, anemia, and hyponatremia. Small leuks on UA today - will culture. Encouraged rest, hydration, staying out of the heat, eating balanced meals. Will follow-up with results as they come in. Patient aware of signs/symptoms requiring further/urgent evaluation.   - CBC with Differential - COMPLETE METABOLIC PANEL WITH GFR - Urine Culture - Urinalysis   Follow-up if symptoms worsen or fail to improve.    Terrilyn Saver, NP

## 2021-05-27 NOTE — Patient Instructions (Signed)
Sending urine for culture to see if you have a urinary tract infection.  Labs today to check blood counts, electrolytes, kidney function, etc.  We will let you know results and any changes to plan of care.  In the meantime, try to rest, stay hydrated, and eat a balanced diet. Hope you feel better soon!

## 2021-05-28 ENCOUNTER — Ambulatory Visit: Payer: Medicare Other | Admitting: Family Medicine

## 2021-05-28 ENCOUNTER — Encounter: Payer: Self-pay | Admitting: Physician Assistant

## 2021-05-28 LAB — COMPLETE METABOLIC PANEL WITH GFR
AG Ratio: 1 (calc) (ref 1.0–2.5)
ALT: 5 U/L — ABNORMAL LOW (ref 6–29)
AST: 12 U/L (ref 10–35)
Albumin: 3.8 g/dL (ref 3.6–5.1)
Alkaline phosphatase (APISO): 73 U/L (ref 37–153)
BUN/Creatinine Ratio: 14 (calc) (ref 6–22)
BUN: 15 mg/dL (ref 7–25)
CO2: 35 mmol/L — ABNORMAL HIGH (ref 20–32)
Calcium: 9.4 mg/dL (ref 8.6–10.4)
Chloride: 93 mmol/L — ABNORMAL LOW (ref 98–110)
Creat: 1.07 mg/dL — ABNORMAL HIGH (ref 0.60–0.93)
GFR, Est African American: 61 mL/min/{1.73_m2} (ref >=60)
GFR, Est Non African American: 53 mL/min/{1.73_m2} — ABNORMAL LOW (ref >=60)
Globulin: 3.8 g/dL (calc) — ABNORMAL HIGH (ref 1.9–3.7)
Glucose, Bld: 80 mg/dL (ref 65–99)
Potassium: 4.6 mmol/L (ref 3.5–5.3)
Sodium: 136 mmol/L (ref 135–146)
Total Bilirubin: 0.4 mg/dL (ref 0.2–1.2)
Total Protein: 7.6 g/dL (ref 6.1–8.1)

## 2021-05-28 LAB — CBC WITH DIFFERENTIAL/PLATELET
Absolute Monocytes: 796 cells/uL (ref 200–950)
Basophils Absolute: 48 cells/uL (ref 0–200)
Basophils Relative: 0.7 %
Eosinophils Absolute: 143 cells/uL (ref 15–500)
Eosinophils Relative: 2.1 %
HCT: 27.3 % — ABNORMAL LOW (ref 35.0–45.0)
Hemoglobin: 8.9 g/dL — ABNORMAL LOW (ref 11.7–15.5)
Lymphs Abs: 918 cells/uL (ref 850–3900)
MCH: 31 pg (ref 27.0–33.0)
MCHC: 32.6 g/dL (ref 32.0–36.0)
MCV: 95.1 fL (ref 80.0–100.0)
MPV: 9 fL (ref 7.5–12.5)
Monocytes Relative: 11.7 %
Neutro Abs: 4896 cells/uL (ref 1500–7800)
Neutrophils Relative %: 72 %
Platelets: 321 10*3/uL (ref 140–400)
RBC: 2.87 10*6/uL — ABNORMAL LOW (ref 3.80–5.10)
RDW: 10.9 % — ABNORMAL LOW (ref 11.0–15.0)
Total Lymphocyte: 13.5 %
WBC: 6.8 10*3/uL (ref 3.8–10.8)

## 2021-05-28 NOTE — Progress Notes (Signed)
MyChart message sent:  Labs are close to where you have been running the past several months. Hemoglobin did drop down just slightly, but is stable compared to where you have been over the past 4 months or so. You need to be sure you are continuing your oral iron every other day. I am still waiting on the urine culture to result. If your symptoms become more severe: extreme fatigue, confusion, blood loss, chest pain, trouble breathing, etc then please go to the Emergency Department.

## 2021-05-29 LAB — URINE CULTURE
MICRO NUMBER:: 12043074
SPECIMEN QUALITY:: ADEQUATE

## 2021-05-29 LAB — CBC WITH DIFFERENTIAL/PLATELET

## 2021-05-29 LAB — COMPLETE METABOLIC PANEL WITH GFR

## 2021-05-29 LAB — URINALYSIS

## 2021-05-31 ENCOUNTER — Ambulatory Visit: Payer: Medicare Other | Admitting: Medical-Surgical

## 2021-05-31 NOTE — Progress Notes (Signed)
Your urine culture does not indicate a urinary tract infection - just some colonized bacteria on your skin, but no need to treat this. Stay hydrated and continue practicing good hygiene. If new symptoms develop please let us know.

## 2021-06-01 ENCOUNTER — Other Ambulatory Visit: Payer: Self-pay

## 2021-06-01 ENCOUNTER — Telehealth (INDEPENDENT_AMBULATORY_CARE_PROVIDER_SITE_OTHER): Payer: Medicare Other | Admitting: Physician Assistant

## 2021-06-01 DIAGNOSIS — D638 Anemia in other chronic diseases classified elsewhere: Secondary | ICD-10-CM

## 2021-06-01 DIAGNOSIS — Z87440 Personal history of urinary (tract) infections: Secondary | ICD-10-CM

## 2021-06-01 DIAGNOSIS — J9611 Chronic respiratory failure with hypoxia: Secondary | ICD-10-CM

## 2021-06-01 DIAGNOSIS — F418 Other specified anxiety disorders: Secondary | ICD-10-CM | POA: Diagnosis not present

## 2021-06-01 DIAGNOSIS — J432 Centrilobular emphysema: Secondary | ICD-10-CM

## 2021-06-01 DIAGNOSIS — K580 Irritable bowel syndrome with diarrhea: Secondary | ICD-10-CM | POA: Diagnosis not present

## 2021-06-01 MED ORDER — ALBUTEROL SULFATE HFA 108 (90 BASE) MCG/ACT IN AERS: INHALATION_SPRAY | RESPIRATORY_TRACT | 0 refills | Status: DC

## 2021-06-01 MED ORDER — DICYCLOMINE HCL 10 MG PO CAPS: ORAL_CAPSULE | ORAL | 1 refills | Status: DC

## 2021-06-01 NOTE — Progress Notes (Signed)
..Virtual Visit via Telephone Note  I connected with Claire [name] on 06/02/21 at  3:20 PM EDT by telephone and verified that I am speaking with the correct person using two identifiers.  Location: Patient: home Provider: clinic  .[name] KitchenParticipating in visit:  Patient: Claire [name] Provider: Tandy Gaw PA-C   I discussed the limitations, risks, security and privacy concerns of performing an evaluation and management service by telephone and the availability of in person appointments. I also discussed with the patient that there may be a patient responsible charge related to this service. The patient expressed understanding and agreed to proceed.   History of Present Illness: Pt is a 70 yo female with severe COPD and chronic respiratory failure with hx of anemia of chronic disease, hypothyroidism, recurrent UTIs, C.diff who calls into the clinic with some concerns.   I was out of office last week and she had some labs and wanted to go over them with me.   She would like increase of bentyl for IbS symptoms.   She would like to restart premarin cream for UTI prevention.   Concerned about how fatigued she is.     .. Active Ambulatory Problems    Diagnosis Date Noted   PTSD (post-traumatic stress disorder) 11/10/2011   Neurosis, anxiety, panic type 11/10/2011   Acquired hypothyroidism 11/24/2014   BP (high blood pressure) 07/24/2012   Chronic use of benzodiazepine for therapeutic purpose 08/11/2015   Hyponatremia 12/19/2016   Hyperkalemia 12/19/2016   Centrilobular emphysema (HCC) 12/20/2016   Stage 3a chronic kidney disease (HCC) 01/16/2017   Iron deficiency anemia 01/16/2017   Anemia of chronic disease 05/11/2017   Low serum vitamin B12 05/14/2017   Vitamin D deficiency 05/14/2017   Depressed mood 05/14/2017   High serum parathyroid hormone (PTH) 07/19/2017   Normocytic anemia 07/19/2017   DOE (dyspnea on exertion) 05/09/2018   Recurrent UTI 05/07/2019   Leg cramps 05/29/2019    No energy 05/29/2019   History of hypokalemia 10/09/2019   Vaccine counseling 12/24/2019   Menopausal vaginal dryness 12/24/2019   Chronic rhinitis 01/31/2020   Chronic respiratory failure with hypoxia (HCC) 01/31/2020   Unintended weight loss 02/19/2020   Macrocytic anemia 03/10/2020   Elevated ferritin 03/10/2020   Irritable bowel syndrome with diarrhea 03/10/2020   Anxiety about health 03/23/2020   Pernicious anemia 03/23/2020   Malnutrition of mild degree (HCC) 05/06/2020   Body mass index (BMI) of 19 or less in adult 05/08/2020   Constipation 07/13/2020   Lower extremity edema 07/29/2020   Clostridium difficile colitis 09/11/2020   Mild protein-calorie malnutrition (HCC) 01/19/2021   Underweight 01/22/2021   History of Clostridioides difficile colitis 02/22/2021   Medication management 03/15/2021   Cloudy urine 04/23/2021   Stage 3b chronic kidney disease (HCC) 04/23/2021   Acute cystitis with hematuria 04/26/2021   History of UTI 06/02/2021   Resolved Ambulatory Problems    Diagnosis Date Noted   Hypothyroid    Acute renal failure (HCC) 12/19/2016   Decreased hemoglobin 12/20/2016   Acute kidney injury (HCC) 03/17/2017   Sinobronchitis 06/16/2020   Past Medical History:  Diagnosis Date   Acid reflux    Anxiety    High blood pressure     Observations/Objective: No acute distress Anxious mood.  No labored breathing.    Assessment and Plan: [name] KitchenMarland KitchenDiagnoses and all orders for this visit:  Anxiety about health  Centrilobular emphysema (HCC) -     albuterol (PROAIR HFA) 108 (90 Base) MCG/ACT inhaler; INHALE 1-2 PUFFS  INTO THE LUNGS EVERY 4 (FOUR) HOURS AS NEEDED FOR WHEEZING OR SHORTNESS OF BREATH.  Irritable bowel syndrome with diarrhea -     dicyclomine (BENTYL) 10 MG capsule; Take 1 to 2 tablets up to three times a day.  Chronic respiratory failure with hypoxia (HCC)  Anemia of chronic disease  Discussed labs. Overall stable.  Continues to have anemia.  Encouraged to make hematology appt to look into options to help with RBC production. She agrees to do this.   She is overusing her proair and running out too soon. Discussed to use duoneb more. Her last rescue inhaler broke as well. Would like refill sent to [facility].  Discussed importance of taking spiriva and dulera daily.  Pt continues to smoke but she has cut back a lot.  Pt not willing to completely quit.   Discussion about IBS, mixed. She has bentyl but would like increase in dose. 1-2 10mg  tablets as needed up to three times a day. Watch out for constipation. Continue on probiotics.   Discussed another urine sample if urinary symptoms continue. Last culture confirmed no active infection. Go back to using premarin cream for prevention.   Many reasons for fatigue. Make sure using oxygen continuously, make sure eating 3 meals and snacks, continue on iron every other day since she has some side effects, add b12 . Follow up in 3 months.     Follow Up Instructions:    I discussed the assessment and treatment plan with the patient. The patient was provided an opportunity to ask questions and all were answered. The patient agreed with the plan and demonstrated an understanding of the instructions.   The patient was advised to call back or seek an in-person evaluation if the symptoms worsen or if the condition fails to improve as anticipated.  I provided 30 minutes of non-face-to-face time during this encounter.   , PA-C

## 2021-06-01 NOTE — Progress Notes (Signed)
Called pt at 3:15 no answer.

## 2021-06-02 ENCOUNTER — Encounter: Payer: Self-pay | Admitting: Physician Assistant

## 2021-06-02 DIAGNOSIS — Z87440 Personal history of urinary (tract) infections: Secondary | ICD-10-CM | POA: Insufficient documentation

## 2021-06-05 ENCOUNTER — Other Ambulatory Visit (HOSPITAL_COMMUNITY): Payer: Self-pay | Admitting: Physician Assistant

## 2021-06-05 DIAGNOSIS — J432 Centrilobular emphysema: Secondary | ICD-10-CM

## 2021-06-10 ENCOUNTER — Ambulatory Visit: Payer: Medicare Other | Admitting: Family Medicine

## 2021-06-10 ENCOUNTER — Other Ambulatory Visit (HOSPITAL_COMMUNITY): Payer: Self-pay | Admitting: Physician Assistant

## 2021-06-10 DIAGNOSIS — J432 Centrilobular emphysema: Secondary | ICD-10-CM

## 2021-06-11 ENCOUNTER — Ambulatory Visit: Payer: Medicare Other | Admitting: Family Medicine

## 2021-06-17 ENCOUNTER — Other Ambulatory Visit (HOSPITAL_COMMUNITY): Payer: Self-pay | Admitting: Physician Assistant

## 2021-06-17 DIAGNOSIS — I10 Essential (primary) hypertension: Secondary | ICD-10-CM

## 2021-06-21 ENCOUNTER — Telehealth (INDEPENDENT_AMBULATORY_CARE_PROVIDER_SITE_OTHER): Payer: Medicare Other | Admitting: Psychiatry

## 2021-06-21 ENCOUNTER — Encounter (HOSPITAL_COMMUNITY): Payer: Self-pay | Admitting: Psychiatry

## 2021-06-21 DIAGNOSIS — F411 Generalized anxiety disorder: Secondary | ICD-10-CM | POA: Diagnosis not present

## 2021-06-21 DIAGNOSIS — F4310 Post-traumatic stress disorder, unspecified: Secondary | ICD-10-CM

## 2021-06-21 MED ORDER — CLONAZEPAM 0.5 MG PO TABS: 0.5000 mg | ORAL_TABLET | Freq: Three times a day (TID) | ORAL | 0 refills | Status: DC

## 2021-06-21 NOTE — Progress Notes (Signed)
Patient ID: Claire [name], female   DOB: November 13, 1951, 70 y.o.   MRN: [id number]  [facility] Out[facility] Follow up visit [address] [phone] 70 y.o.  06/21/2021 3:12 PM  Chief Complaint:  Anxiety follow up, med review  Virtual Visit via Telephone Note  I connected with Claire [name] on 06/21/21 at  3:00 PM EDT by telephone and verified that I am speaking with the correct person using two identifiers.  Location: Patient: home Provider: home office   I discussed the limitations, risks, security and privacy concerns of performing an evaluation and management service by telephone and the availability of in person appointments. I also discussed with the patient that there may be a patient responsible charge related to this service. The patient expressed understanding and agreed to proceed.       I discussed the assessment and treatment plan with the patient. The patient was provided an opportunity to ask questions and all were answered. The patient agreed with the plan and demonstrated an understanding of the instructions.   The patient was advised to call back or seek an in-person evaluation if the symptoms worsen or if the condition fails to improve as anticipated.  I provided 15 minutes of non-face-to-face time during this encounter.   Thresa Ross, MD   HPI  Patient is doing reasonable in wording to anxiety but has put her dog down a week ago so she is suffering from grief overall daughter is supportive she has tried several SSRIs before but Klonopin is only medication that she wants to take and it helps anxiety   Overall manageable on baseline  Modifying factor: breahting techniques.  Grandkids  PTSD is related to her past some flashbacks. Distraction helps  Grief over her dog's death No side effects     Aggravating factors: past childhood No psychosis  Medical History; Past Medical History:  Diagnosis Date   Acid reflux     Anxiety    High blood pressure    High serum parathyroid hormone (PTH) 07/19/2017   96 06/28/2014, 162 12/29/16   Hypothyroid    PTSD (post-traumatic stress disorder)     Allergies: Allergies  Allergen Reactions   Bactrim [Sulfamethoxazole-Trimethoprim]     ACUTE RENAL fAILURE/HYPONATREMIA/HYPERKALEMIA   Macrodantin [Nitrofurantoin Macrocrystal] Rash   Trimethoprim     Fatigue/weakness/no appetite   Zithromax [Azithromycin]     diarrhea    Medications: Outpatient Encounter Medications as of 06/21/2021  Medication Sig   albuterol (PROAIR HFA) 108 (90 Base) MCG/ACT inhaler INHALE 1-2 PUFFS INTO THE LUNGS EVERY 4 (FOUR) HOURS AS NEEDED FOR WHEEZING OR SHORTNESS OF BREATH.   albuterol (PROVENTIL) (2.5 MG/3ML) 0.083% nebulizer solution 1 VIAL IN NEBULIZER EVERY 4 HOURS AS NEEDED WHEEZING OR FOR SHORTNESS OF BREATH   AMBULATORY NON FORMULARY MEDICATION Continuous stationary and portable oxygen tanks for use with ambulation. DX: COPD with hypoxia.   AMBULATORY NON FORMULARY MEDICATION Portable oxygen concentrator DX: COPD with hypoxia.   AMBULATORY NON FORMULARY MEDICATION One spacer to use with inhaler for better delivery of medication.   atenolol (TENORMIN) 25 MG tablet TAKE ONE TABLET BY MOUTH EVERY DAY   Azelastine HCl (ASTEPRO) 0.15 % SOLN Place 2 sprays into the nose daily.   cetirizine (ZYRTEC) 10 MG chewable tablet Chew 10 mg by mouth daily.   cholecalciferol (VITAMIN D) 25 MCG (1000 UT) tablet Take 1,000 Units by mouth daily.   clonazePAM (KLONOPIN) 0.5 MG tablet Take 1 tablet (0.5 mg total) by  mouth 3 (three) times daily.   dicyclomine (BENTYL) 10 MG capsule Take 1 to 2 tablets up to three times a day.   estradiol (ESTRACE VAGINAL) 0.1 MG/GM vaginal cream Place 1 Applicatorful vaginally 3 (three) times a week. And one pea-size amt applied to urethral opening.   famotidine (PEPCID) 20 MG tablet TAKE 1 TABLET BY MOUTH TWICE A DAY (Patient taking differently: Take 20 mg by mouth daily  as needed.)   ferrous sulfate 325 (65 FE) MG EC tablet 1 tab PO TID every other day   Lactobacillus (FLORAJEN ACIDOPHILUS) CAPS Take 1 capsule by mouth daily.   levothyroxine (SYNTHROID) 75 MCG tablet TAKE 1 TABLET BY MOUTH EVERY DAY   mometasone-formoterol (DULERA) 100-5 MCG/ACT AERO TAKE 2 PUFFS BY MOUTH TWICE A DAY   pantoprazole (PROTONIX) 40 MG tablet Take 1 tablet (40 mg total) by mouth daily.   saccharomyces boulardii (FLORASTOR) 250 MG capsule Take by mouth.   Tiotropium Bromide Monohydrate (SPIRIVA RESPIMAT) 2.5 MCG/ACT AERS Inhale 2 puffs into the lungs daily.   [DISCONTINUED] clonazePAM (KLONOPIN) 0.5 MG tablet Take 1 tablet (0.5 mg total) by mouth 3 (three) times daily.   No facility-administered encounter medications on file as of 06/21/2021.    Family History; Family History  Problem Relation Age of Onset   Anxiety disorder Mother    OCD Daughter    Alcohol abuse Father    Anxiety disorder Sister        Labs:      Mental Status Examination;   Psychiatric Specialty Exam: Physical Exam  Review of Systems  Cardiovascular:  Negative for chest pain.  Psychiatric/Behavioral:  Negative for depression and suicidal ideas.    There were no vitals taken for this visit.There is no height or weight on file to calculate BMI.  General Appearance:   Eye Contact::    Speech:  Slow  Volume:  Decreased  Mood: fair  Affect:  congruent  Thought Process:  Coherent and intact  Orientation:  Full (Time, Place, and Person)  Thought Content:  Rumination  Suicidal Thoughts:  No  Homicidal Thoughts:  No  Memory:  Immediate;   Fair Recent;   Fair  Judgement:  Fair  Insight:  Shallow  Psychomotor Activity:  Normal  Concentration:  Fair  Recall:  Fair  Akathisia:  Negative  Handed:  Right  AIMS (if indicated):     Assets:  Social Support Vocational/Educational  Sleep:      Prior documentation, copy reviewed   Assessment: Axis I: Panic disorder. Possible PTSD. Rule  out generalized anxiety disorder. Nicotine dependence  Axis II: Deferred  Axis III:  Past Medical History:  Diagnosis Date   Acid reflux    Anxiety    High blood pressure    High serum parathyroid hormone (PTH) 07/19/2017   96 06/28/2014, 162 12/29/16   Hypothyroid    PTSD (post-traumatic stress disorder)     Axis IV: Psychosocial. History of trauma   Treatment Plan and Summary: Prior documentation reviewed Generalized anxiety disorder; continue Klonopin  PTSD: Manageable does not want to be on SSRI Klonopin helpful continue other supportive therapy follow-up in 3 months  Thresa Ross, MD 06/21/2021

## 2021-07-05 ENCOUNTER — Other Ambulatory Visit: Payer: Self-pay | Admitting: Nurse Practitioner

## 2021-07-05 DIAGNOSIS — J432 Centrilobular emphysema: Secondary | ICD-10-CM

## 2021-07-15 ENCOUNTER — Telehealth (INDEPENDENT_AMBULATORY_CARE_PROVIDER_SITE_OTHER): Payer: Medicare Other | Admitting: Family Medicine

## 2021-07-15 ENCOUNTER — Encounter: Payer: Self-pay | Admitting: Family Medicine

## 2021-07-15 DIAGNOSIS — K580 Irritable bowel syndrome with diarrhea: Secondary | ICD-10-CM

## 2021-07-15 NOTE — Progress Notes (Signed)
Virtual Visit via Telephone Note  I connected with  BRIAN KOCOUREK on 07/15/21 at  3:00 PM EDT by telephone and verified that I am speaking with the correct person using two identifiers.   I discussed the limitations, risks, security and privacy concerns of performing an evaluation and management service by telephone and the availability of in person appointments. I also discussed with the patient that there may be a patient responsible charge related to this service. The patient expressed understanding and agreed to proceed.  Participating parties included in this telephone visit include: The patient and the nurse practitioner listed.  The patient is: At home I am: In the office  Subjective:    CC: family has COVID  HPI: Claire [name] is a 70 y.o. year old female presenting today via telephone visit to discuss family with COVID.  Daughter, grandson, son-in-law have COVID. She has not been around them recently. States she is mostly home alone, but they will drop her groceries off although she will always wear a mask and stay in the bathroom while they are putting the groceries. Reports she is feeling perfectly fine. She is concerned because her grandson has been having dysuria - she is afraid he will get a kidney infection.   PCP told her to take her iron every other day. States it makes her constipated. Also struggles with IBS - unsure of what she can and cannot eat.     Past medical history, Surgical history, Family history not pertinant except as noted below, Social history, Allergies, and medications have been entered into the medical record, reviewed, and corrections made.   Review of Systems:  All review of systems negative except what is listed in the HPI  Objective:    General:  Patient speaking clearly in complete sentences. No shortness of breath noted.   Alert and oriented x3.   Normal judgment.  No apparent acute distress.  Impression and Recommendations:    Family  with COVID Patient has not been in close contact with anyone who is positive and she is not having any symptoms. Reassurance provided. Encouraged she continue to wear a mask when around others and wash hands frequently. She is not interested in getting vaccinated.  2. Irritable bowel syndrome with diarrhea Education handout attached to AVS with food recommendations per patient request.  Patient aware of signs/symptoms requiring further/urgent evaluation.  Follow-up as needed.    I discussed the assessment and treatment plan with the patient. The patient was provided an opportunity to ask questions and all were answered. The patient agreed with the plan and demonstrated an understanding of the instructions.   The patient was advised to call back or seek an in-person evaluation if the symptoms worsen or if the condition fails to improve as anticipated.  I provided 20 minutes of non-face-to-face time during this TELEPHONE encounter.    Lollie Marrow Reola Calkins, DNP, FNP-C

## 2021-07-15 NOTE — Progress Notes (Signed)
Pt reports that her daughter,grandson,and son-in-law all have COVID. And she is worried. She hasn't been around them since they were diagnosed.   She's really upset because her grandson has now had issues with urination and was seen at [facility]  for this. 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   She hasn't been vaccinated. She would like to see [name] but because she has so many patients she wasn't able to see her but understands that she has to see another provider.

## 2021-07-19 ENCOUNTER — Telehealth: Payer: Self-pay

## 2021-07-19 NOTE — Telephone Encounter (Signed)
[name] called and left a message. She is wanting a nurse visit for possible UTI. I called her back and she is not sure about what time she can come in. She will call back.

## 2021-07-21 ENCOUNTER — Telehealth (HOSPITAL_COMMUNITY): Payer: Self-pay

## 2021-07-21 ENCOUNTER — Other Ambulatory Visit (HOSPITAL_COMMUNITY): Payer: Self-pay | Admitting: Psychiatry

## 2021-07-21 NOTE — Telephone Encounter (Signed)
Patient needs a refill on clonazepam sent to [facility] pharmacy

## 2021-07-22 ENCOUNTER — Other Ambulatory Visit (HOSPITAL_COMMUNITY): Payer: Self-pay | Admitting: Psychiatry

## 2021-07-22 ENCOUNTER — Ambulatory Visit (INDEPENDENT_AMBULATORY_CARE_PROVIDER_SITE_OTHER): Payer: Medicare Other | Admitting: Family Medicine

## 2021-07-22 ENCOUNTER — Other Ambulatory Visit: Payer: Self-pay

## 2021-07-22 VITALS — BP 94/51 | HR 94 | Temp 98.4°F

## 2021-07-22 DIAGNOSIS — N951 Menopausal and female climacteric states: Secondary | ICD-10-CM | POA: Diagnosis not present

## 2021-07-22 DIAGNOSIS — N390 Urinary tract infection, site not specified: Secondary | ICD-10-CM | POA: Diagnosis not present

## 2021-07-22 DIAGNOSIS — N949 Unspecified condition associated with female genital organs and menstrual cycle: Secondary | ICD-10-CM

## 2021-07-22 LAB — POCT URINALYSIS DIP (CLINITEK)
Bilirubin, UA: NEGATIVE
Glucose, UA: NEGATIVE mg/dL
Ketones, POC UA: NEGATIVE mg/dL
Nitrite, UA: NEGATIVE
POC PROTEIN,UA: NEGATIVE
Spec Grav, UA: 1.02 (ref 1.010–1.025)
Urobilinogen, UA: 0.2 E.U./dL
pH, UA: 5.5 (ref 5.0–8.0)

## 2021-07-22 LAB — WET PREP FOR TRICH, YEAST, CLUE
MICRO NUMBER:: 12260648
Specimen Quality: ADEQUATE

## 2021-07-22 NOTE — Progress Notes (Signed)
Pt presents today as a nurse visit for a urinalysis.   Onset 7 days ago, burning sensation when sitting down, hx of CKD, hx of vaginal dryness, pt will get anxious when she has discomfort of any kind, denies burning, frequency, urgency, incontinence, urinary retention/hesitancy, abdominal pain, suprapubic pain, N/V  Urinalysis results entered in chart.   I spoke with Dr. Linford Arnold who said that we should sent the urine off for a culture but also do a wet prep based upon her sx presentation. Dr. Linford Arnold also stated that at this time, we will not treat her with anything until the culture and wet prep results are back.   Self swab wet prep collected and sent to [facility].   Pt advised of Dr. Shelah Lewandowsky recommendations and verbalized understanding. Pt will monitor her MyChart account for the results of these tests.

## 2021-07-22 NOTE — Telephone Encounter (Signed)
Sent klonopine refill

## 2021-07-23 ENCOUNTER — Telehealth: Payer: Self-pay

## 2021-07-23 ENCOUNTER — Other Ambulatory Visit: Payer: Self-pay

## 2021-07-23 DIAGNOSIS — J432 Centrilobular emphysema: Secondary | ICD-10-CM

## 2021-07-23 MED ORDER — ALBUTEROL SULFATE HFA 108 (90 BASE) MCG/ACT IN AERS: INHALATION_SPRAY | RESPIRATORY_TRACT | 1 refills | Status: DC

## 2021-07-23 NOTE — Telephone Encounter (Signed)
Urine was just collected yesterday?

## 2021-07-23 NOTE — Telephone Encounter (Signed)
We cannot treat until we see culture due to your antibiotic resistance and hx of clostridium difficle. I will check culture tomorrow to see if it comes in.

## 2021-07-23 NOTE — Telephone Encounter (Signed)
Pt called requesting results of urinalysis.  Please advise.  Tiajuana Amass, CMA

## 2021-07-23 NOTE — Telephone Encounter (Signed)
Pt called requesting ProAir inhaler refill.  RX sent to [facility] per pt request.  Tiajuana Amass, CMA

## 2021-07-23 NOTE — Telephone Encounter (Signed)
Pt called the office and we discussed this issue.  Additional phone note was sent to [facility] for advice.  Tiajuana Amass, CMA

## 2021-07-23 NOTE — Telephone Encounter (Signed)
Pt is concerned that her urine culture will not be back until the weekend and that no one will evaluate the results and start her on meds if it is positive.  She states that she "does not feel well" and doesn't want to have to wait until Monday to be started on medications.  Please advise.  Tiajuana Amass, CMA

## 2021-07-23 NOTE — Telephone Encounter (Signed)
Pt informed.  Pt expressed understanding.  T. Luiscarlos Kaczmarczyk, CMA 

## 2021-07-24 LAB — URINE CULTURE
MICRO NUMBER:: 12264778
SPECIMEN QUALITY:: ADEQUATE

## 2021-07-24 NOTE — Progress Notes (Signed)
Claire [name],   You do not have any significant bacteria accumulation that needs antibiotic. Some bacteria was detected but it is the normal kind on your skin. You do not need an antibiotic. If you are having symptoms come in next week so we can talk about how to work this up.

## 2021-08-05 ENCOUNTER — Telehealth (INDEPENDENT_AMBULATORY_CARE_PROVIDER_SITE_OTHER): Payer: Medicare Other | Admitting: Physician Assistant

## 2021-08-05 ENCOUNTER — Other Ambulatory Visit: Payer: Self-pay

## 2021-08-05 ENCOUNTER — Encounter: Payer: Self-pay | Admitting: Physician Assistant

## 2021-08-05 VITALS — Ht 63.0 in | Wt 92.0 lb

## 2021-08-05 DIAGNOSIS — R0981 Nasal congestion: Secondary | ICD-10-CM

## 2021-08-05 DIAGNOSIS — F418 Other specified anxiety disorders: Secondary | ICD-10-CM | POA: Diagnosis not present

## 2021-08-05 DIAGNOSIS — J432 Centrilobular emphysema: Secondary | ICD-10-CM

## 2021-08-05 DIAGNOSIS — J9611 Chronic respiratory failure with hypoxia: Secondary | ICD-10-CM

## 2021-08-05 DIAGNOSIS — N390 Urinary tract infection, site not specified: Secondary | ICD-10-CM

## 2021-08-05 MED ORDER — IPRATROPIUM-ALBUTEROL 0.5-2.5 (3) MG/3ML IN SOLN: 3.0000 mL | RESPIRATORY_TRACT | 1 refills | Status: DC | PRN

## 2021-08-05 NOTE — Progress Notes (Signed)
..Virtual Visit via Telephone Note  I connected with Claire [name] on 08/06/21 at 11:10 AM EDT by telephone and verified that I am speaking with the correct person using two identifiers.  Location: Patient: home Provider: clinic  .[name] KitchenParticipating in visit:  Patient: Claire [name] Provider: Tandy Gaw PA-C   I discussed the limitations, risks, security and privacy concerns of performing an evaluation and management service by telephone and the availability of in person appointments. I also discussed with the patient that there may be a patient responsible charge related to this service. The patient expressed understanding and agreed to proceed.   History of Present Illness: Patient is a 70 year old female with COPD, chronic respiratory failure, recurrent UTIs, chronic allergic rhinitis who presents to the clinic with concerns.  She has been struggling with persistent nasal congestion for the last 3 to 4 months.  Nasal sprays may currently nose dry.  She started a prednisone pack that she had at home and it helped significantly.  She is on day 1 and wonders if she should finish patch.  She is concerned about her urinary tract infections and her not being able to take antibiotics.  She would like to talk about this.  She continues to have COPD symptoms.  She is using her albuterol inhaler a lot.  The pharmacy is not willing to give it to her because it is too soon.  She wonders what to do.  She is hesitant to use her oxygen because it is the [employer] and it uses natural air to provide oxygen.  She feels like when she uses this she is going to get COVID.  [name]. Active Ambulatory Problems    Diagnosis Date Noted   PTSD (post-traumatic stress disorder) 11/10/2011   Neurosis, anxiety, panic type 11/10/2011   Acquired hypothyroidism 11/24/2014   BP (high blood pressure) 07/24/2012   Chronic use of benzodiazepine for therapeutic purpose 08/11/2015   Hyponatremia 12/19/2016   Hyperkalemia 12/19/2016    Centrilobular emphysema (HCC) 12/20/2016   Stage 3a chronic kidney disease (HCC) 01/16/2017   Iron deficiency anemia 01/16/2017   Anemia of chronic disease 05/11/2017   Low serum vitamin B12 05/14/2017   Vitamin D deficiency 05/14/2017   Depressed mood 05/14/2017   High serum parathyroid hormone (PTH) 07/19/2017   Normocytic anemia 07/19/2017   DOE (dyspnea on exertion) 05/09/2018   Recurrent UTI 05/07/2019   Leg cramps 05/29/2019   No energy 05/29/2019   History of hypokalemia 10/09/2019   Vaccine counseling 12/24/2019   Menopausal vaginal dryness 12/24/2019   Chronic rhinitis 01/31/2020   Chronic respiratory failure with hypoxia (HCC) 01/31/2020   Unintended weight loss 02/19/2020   Macrocytic anemia 03/10/2020   Elevated ferritin 03/10/2020   Irritable bowel syndrome with diarrhea 03/10/2020   Anxiety about health 03/23/2020   Pernicious anemia 03/23/2020   Malnutrition of mild degree (HCC) 05/06/2020   Body mass index (BMI) of 19 or less in adult 05/08/2020   Constipation 07/13/2020   Lower extremity edema 07/29/2020   Clostridium difficile colitis 09/11/2020   Mild protein-calorie malnutrition (HCC) 01/19/2021   Underweight 01/22/2021   History of Clostridioides difficile colitis 02/22/2021   Medication management 03/15/2021   Cloudy urine 04/23/2021   Stage 3b chronic kidney disease (HCC) 04/23/2021   Acute cystitis with hematuria 04/26/2021   History of UTI 06/02/2021   Nasal congestion 08/06/2021   Resolved Ambulatory Problems    Diagnosis Date Noted   Hypothyroid    Acute renal failure (HCC) 12/19/2016  Decreased hemoglobin 12/20/2016   Acute kidney injury (HCC) 03/17/2017   Sinobronchitis 06/16/2020   Past Medical History:  Diagnosis Date   Acid reflux    Anxiety    High blood pressure     Observations: No acute distress Anxious about overall health Congested sounding on the phone.   .. Today's Vitals   08/05/21 1044  Weight: 92 lb (41.7 kg)   Height: 5\' 3"  (1.6 m)   Body mass index is 16.3 kg/m.     Assessment and Plan: [name] KitchenTamaya was seen today for nasal congestion.  Diagnoses and all orders for this visit:  Nasal congestion  Anxiety about health  Centrilobular emphysema (HCC) -     ipratropium-albuterol (DUONEB) 0.5-2.5 (3) MG/3ML SOLN; Take 3 mLs by nebulization every 2 (two) hours as needed (wheeze, SOB).  Chronic respiratory failure with hypoxia (HCC) -     ipratropium-albuterol (DUONEB) 0.5-2.5 (3) MG/3ML SOLN; Take 3 mLs by nebulization every 2 (two) hours as needed (wheeze, SOB).  Recurrent UTI  Ok'd her to use prednisone burst she has at house for her persistent nasal congestion. Nasal sprays make her nose too dry. Try nasal saline rinses.   Continue on sprivia and dulera for COPD. Consider switching to breztri. Using albuterol inhaler more than she should and running out. She may not be getting all the solution. Use nebulizer more. Sent duoneb solution to pharmacy. Continue using O2.   Reassured her about concerns of UTI. She has only had 2 UTIs this year. The rest were just inflammation. This is good. Continue premarin for prevention.      Follow Up Instructions:    I discussed the assessment and treatment plan with the patient. The patient was provided an opportunity to ask questions and all were answered. The patient agreed with the plan and demonstrated an understanding of the instructions.   The patient was advised to call back or seek an in-person evaluation if the symptoms worsen or if the condition fails to improve as anticipated.  I provided 30 minutes of non-face-to-face time during this encounter.   Rinaldo Cloud, PA-C

## 2021-08-06 DIAGNOSIS — R0981 Nasal congestion: Secondary | ICD-10-CM | POA: Insufficient documentation

## 2021-08-19 ENCOUNTER — Other Ambulatory Visit (HOSPITAL_COMMUNITY): Payer: Self-pay | Admitting: Psychiatry

## 2021-08-19 NOTE — Telephone Encounter (Signed)
Patient called this morning requesting a refill on clonazepam. [facility]

## 2021-08-23 ENCOUNTER — Encounter: Payer: Self-pay | Admitting: Physician Assistant

## 2021-08-23 ENCOUNTER — Telehealth: Payer: Self-pay | Admitting: Neurology

## 2021-08-23 NOTE — Telephone Encounter (Signed)
Patient left two voicemail messages asking for an appt to see [facility] tomorrow. She was told by the front desk no appts were available. She refused to see any other provider. She stated she had been sick and not feeling well all weekend. Wanted a message sent to [facility] to see if she could "work her in".

## 2021-08-24 NOTE — Telephone Encounter (Signed)
Left voicemail letting patient know she is on the schedule for tomorrow @ 2:20 pm, check in 2:10 pm - AM

## 2021-08-24 NOTE — Telephone Encounter (Signed)
I put on the schedule Wednesday at 2:20 pm per [facility]. Can we let her know?

## 2021-08-25 ENCOUNTER — Encounter: Payer: Self-pay | Admitting: Physician Assistant

## 2021-08-25 ENCOUNTER — Other Ambulatory Visit: Payer: Self-pay

## 2021-08-25 ENCOUNTER — Ambulatory Visit (INDEPENDENT_AMBULATORY_CARE_PROVIDER_SITE_OTHER): Payer: Medicare Other | Admitting: Physician Assistant

## 2021-08-25 VITALS — BP 122/90 | HR 97 | Temp 98.6°F | Ht 63.0 in | Wt 90.0 lb

## 2021-08-25 DIAGNOSIS — J432 Centrilobular emphysema: Secondary | ICD-10-CM | POA: Diagnosis not present

## 2021-08-25 DIAGNOSIS — R350 Frequency of micturition: Secondary | ICD-10-CM

## 2021-08-25 DIAGNOSIS — D638 Anemia in other chronic diseases classified elsewhere: Secondary | ICD-10-CM | POA: Diagnosis not present

## 2021-08-25 DIAGNOSIS — N1831 Chronic kidney disease, stage 3a: Secondary | ICD-10-CM

## 2021-08-25 MED ORDER — AMBULATORY NON FORMULARY MEDICATION: 2 refills | Status: AC

## 2021-08-25 NOTE — Patient Instructions (Addendum)
Please eat more often but smaller portions.  Choose more protein rich foods.  Consider protein shakes.  Will get full mask canula.   About Rectocele  Overview  A rectocele is a type of hernia which causes different degrees of bulging of the rectal tissues into the vaginal wall.  You may even notice that it presses against the vaginal wall so much that some vaginal tissues droop outside of the opening of your vagina.  Causes of Rectocele  The most common cause is childbirth.  The muscles and ligaments in the pelvis that hold up and support the female organs and vagina become stretched and weakened during labor and delivery.  The more babies you have, the more the support tissues are stretched and weakened.  Not everyone who has a baby will develop a rectocele.  Some women have stronger supporting tissue in the pelvis and may not have as much of a problem as others.  Women who have a Cesarean section usually do not get rectocele's unless they pushed a long time prior to the cesarean delivery.  Other conditions that can cause a rectocele include chronic constipation, a chronic cough, a lot of heavy lifting, and obesity.  Older women may have this problem because the loss of female hormones causes the vaginal tissue to become weaker.  Symptoms  There may not be any symptoms.  If you do have symptoms, they may include: Pelvic pressure in the rectal area Protrusion of the lower part of the vagina through the opening of the vagina Constipation and trapping of the stool, making it difficult to have a bowel movement.  In severe cases, you may have to press on the lower part of your vagina to help push the stool out of you rectum.  This is called splinting to empty.  Diagnosing Rectocele  Your health care provider will ask about your symptoms and perform a pelvic exam.  S/he will ask you to bear down, pushing like you are having a bowel movement so as to see how far the lower part of the vagina  protrudes into the vagina and possible outside of the vagina.  Your provider will also ask you to contract the muscles of your pelvis (like you are stopping the stream in the middle of urinating) to determine the strength of your pelvic muscles.  Your provider may also do a rectal exam.  Treatment Options  If you do not have any symptoms, no treatment may be necessary.  Other treatment options include: Pelvic floor exercises: Contracting the muscles in your genital area may help strengthen your muscles and support the organs.  Be sure to get proper exercise instruction from you physical therapist. A pessary (removealbe pelvic support device) sometimes helps rectocele symptoms. Surgery: Surgical repair may be necessary. In some cases the uterus may need to be taken out ( a hysterectomy) as well.  There are many types of surgery for pelvic support problems.  Look for physicians who specialize in repair procedures.  You can take care of yourself by: Treating and preventing constipation Avoiding heavy lifting, and lifting correctly (with your legs, not with you waist or back) Treating a chronic cough or bronchitis Not smoking avoiding too much weight gain Doing pelvic floor exercises   2007, [employer] Doc.33

## 2021-08-25 NOTE — Progress Notes (Signed)
Subjective:    Patient ID: Claire [name], female    DOB: 1950-12-24, 70 y.o.   MRN: [id number]  HPI Pt is a 70 yo female with severe COPD, GAD, MDD, recurrent UTI, IBS, anemia of chronic disease who presents to the clinic because she feels "weak until she eats".   She notices that she is not breathing has much out of nose when she is sleeping and wonders how much oxygen she is getting.   Ongoing COPD. On 3L of O2. On dulera and spiriva and do not want to switch.   Having some increased urinary and frequents. Hx of recurrent UTI. No fever, chills, body aches. Wants to make sure she does not have UTI before the weekned.   .. Active Ambulatory Problems    Diagnosis Date Noted   PTSD (post-traumatic stress disorder) 11/10/2011   Neurosis, anxiety, panic type 11/10/2011   Acquired hypothyroidism 11/24/2014   BP (high blood pressure) 07/24/2012   Chronic use of benzodiazepine for therapeutic purpose 08/11/2015   Hyponatremia 12/19/2016   Hyperkalemia 12/19/2016   Centrilobular emphysema (HCC) 12/20/2016   Stage 3a chronic kidney disease (HCC) 01/16/2017   Iron deficiency anemia 01/16/2017   Anemia of chronic disease 05/11/2017   Low serum vitamin B12 05/14/2017   Vitamin D deficiency 05/14/2017   Depressed mood 05/14/2017   High serum parathyroid hormone (PTH) 07/19/2017   Normocytic anemia 07/19/2017   DOE (dyspnea on exertion) 05/09/2018   Recurrent UTI 05/07/2019   Leg cramps 05/29/2019   No energy 05/29/2019   History of hypokalemia 10/09/2019   Vaccine counseling 12/24/2019   Menopausal vaginal dryness 12/24/2019   Chronic rhinitis 01/31/2020   Chronic respiratory failure with hypoxia (HCC) 01/31/2020   Unintended weight loss 02/19/2020   Macrocytic anemia 03/10/2020   Elevated ferritin 03/10/2020   Irritable bowel syndrome with diarrhea 03/10/2020   Anxiety about health 03/23/2020   Pernicious anemia 03/23/2020   Malnutrition of mild degree (HCC) 05/06/2020   Body  mass index (BMI) of 19 or less in adult 05/08/2020   Constipation 07/13/2020   Lower extremity edema 07/29/2020   Clostridium difficile colitis 09/11/2020   Mild protein-calorie malnutrition (HCC) 01/19/2021   Underweight 01/22/2021   History of Clostridioides difficile colitis 02/22/2021   Medication management 03/15/2021   Cloudy urine 04/23/2021   Stage 3b chronic kidney disease (HCC) 04/23/2021   Acute cystitis with hematuria 04/26/2021   History of UTI 06/02/2021   Nasal congestion 08/06/2021   Resolved Ambulatory Problems    Diagnosis Date Noted   Hypothyroid    Acute renal failure (HCC) 12/19/2016   Decreased hemoglobin 12/20/2016   Acute kidney injury (HCC) 03/17/2017   Sinobronchitis 06/16/2020   Past Medical History:  Diagnosis Date   Acid reflux    Anxiety    High blood pressure      Review of Systems See HPI.     Objective:   Physical Exam Vitals reviewed.  Constitutional:      Comments: frail  HENT:     Head: Normocephalic.  Cardiovascular:     Rate and Rhythm: Normal rate.  Pulmonary:     Comments: Decreased breath sounds both lungs.  On O2 3L.  Some labored breathing with exertion.  Abdominal:     General: Bowel sounds are normal. There is no distension.     Palpations: There is no mass.     Tenderness: There is no abdominal tenderness. There is no right CVA tenderness, left CVA tenderness, guarding or  rebound.  Musculoskeletal:     Right lower leg: No edema.     Left lower leg: No edema.  Neurological:     General: No focal deficit present.     Mental Status: She is alert and oriented to person, place, and time.  Psychiatric:     Comments: anxious          Assessment & Plan:  [name] KitchenMarland KitchenTamla was seen today for follow-up.  Diagnoses and all orders for this visit:  Urinary frequency -     Urinalysis, Routine w reflex microscopic -     Cancel: Urine Culture  Stage 3a chronic kidney disease (HCC) -     COMPLETE METABOLIC PANEL WITH  GFR  Anemia of chronic disease -     CBC with Differential/Platelet  Centrilobular emphysema (HCC) -     AMBULATORY NON FORMULARY MEDICATION; Medication Name: full face mask to use with oxygen  Could not get urine today. Sent home with collection cup. Continue to stay hydrated but not over hydrated.   Recheck CBC/CmP at patients request.   Discussed trying full mask at bedtime for sleep with O2 instead of nasal canula only. Continue on spiriva/dulera for COPD.   Discussed rectocele. HO given. Continue to monitor.     Spent 30 minutes with patient reviewing chart, discussing plan, reassurance.

## 2021-08-26 ENCOUNTER — Telehealth: Payer: Self-pay | Admitting: Neurology

## 2021-08-26 ENCOUNTER — Telehealth: Payer: Self-pay

## 2021-08-26 DIAGNOSIS — R350 Frequency of micturition: Secondary | ICD-10-CM

## 2021-08-26 LAB — CBC WITH DIFFERENTIAL/PLATELET
Absolute Monocytes: 782 cells/uL (ref 200–950)
Basophils Absolute: 71 cells/uL (ref 0–200)
Basophils Relative: 0.9 %
Eosinophils Absolute: 111 cells/uL (ref 15–500)
Eosinophils Relative: 1.4 %
HCT: 27.8 % — ABNORMAL LOW (ref 35.0–45.0)
Hemoglobin: 9.2 g/dL — ABNORMAL LOW (ref 11.7–15.5)
Lymphs Abs: 1327 cells/uL (ref 850–3900)
MCH: 32.2 pg (ref 27.0–33.0)
MCHC: 33.1 g/dL (ref 32.0–36.0)
MCV: 97.2 fL (ref 80.0–100.0)
MPV: 8.9 fL (ref 7.5–12.5)
Monocytes Relative: 9.9 %
Neutro Abs: 5609 cells/uL (ref 1500–7800)
Neutrophils Relative %: 71 %
Platelets: 337 10*3/uL (ref 140–400)
RBC: 2.86 10*6/uL — ABNORMAL LOW (ref 3.80–5.10)
RDW: 12.1 % (ref 11.0–15.0)
Total Lymphocyte: 16.8 %
WBC: 7.9 10*3/uL (ref 3.8–10.8)

## 2021-08-26 LAB — COMPLETE METABOLIC PANEL WITH GFR
AG Ratio: 1.1 (calc) (ref 1.0–2.5)
ALT: 5 U/L — ABNORMAL LOW (ref 6–29)
AST: 13 U/L (ref 10–35)
Albumin: 3.9 g/dL (ref 3.6–5.1)
Alkaline phosphatase (APISO): 82 U/L (ref 37–153)
BUN/Creatinine Ratio: 12 (calc) (ref 6–22)
BUN: 13 mg/dL (ref 7–25)
CO2: 37 mmol/L — ABNORMAL HIGH (ref 20–32)
Calcium: 9.7 mg/dL (ref 8.6–10.4)
Chloride: 90 mmol/L — ABNORMAL LOW (ref 98–110)
Creat: 1.11 mg/dL — ABNORMAL HIGH (ref 0.60–1.00)
Globulin: 3.6 g/dL (calc) (ref 1.9–3.7)
Glucose, Bld: 87 mg/dL (ref 65–99)
Potassium: 5.2 mmol/L (ref 3.5–5.3)
Sodium: 133 mmol/L — ABNORMAL LOW (ref 135–146)
Total Bilirubin: 0.4 mg/dL (ref 0.2–1.2)
Total Protein: 7.5 g/dL (ref 6.1–8.1)
eGFR: 53 mL/min/{1.73_m2} — ABNORMAL LOW (ref >=60)

## 2021-08-26 NOTE — Progress Notes (Signed)
Claire [name],   Labs are fairly stable. Hgb improved a little. Slight decrease in renal function but overall looks good. Sodium a little low make sure your not drinking too much water without eating. (You have over-hydrated in the past)   If you are interested there is a medication farxiga that has data that is preserves renal function in CKD. We could try this. Biggest side effect is yeast infections for some. Maybe next week at appt we will discuss.

## 2021-08-26 NOTE — Telephone Encounter (Signed)
Full face mask for oxygen sent to [facility] at [phone] with confirmation received.

## 2021-08-26 NOTE — Telephone Encounter (Signed)
Requesting to have nephew bring urine specimen to the office for testing.  States she's still feeling bad and want the urine to tested for the UTI and cultured.  Placed order for UTI and culture per request.

## 2021-08-26 NOTE — Addendum Note (Signed)
Addended by: Ardyth Man on: 08/26/2021 01:58 PM   Modules accepted: Orders

## 2021-08-27 ENCOUNTER — Telehealth: Payer: Self-pay

## 2021-08-27 NOTE — Telephone Encounter (Signed)
Pt called requesting results of urine culture.  Advised pt that urine cultures take at least 48 hours to results and we would be in touch after we receive final results.  Pt requested that we alert PCP to be on the look out for the results over the weekend because she is concerned about getting started on meds ASAP.  Routing message to [employer], PA-C.  Tiajuana Amass, CMA

## 2021-08-27 NOTE — Telephone Encounter (Signed)
Sounds good

## 2021-08-28 LAB — URINE CULTURE
MICRO NUMBER:: 12413164
SPECIMEN QUALITY:: ADEQUATE

## 2021-08-30 ENCOUNTER — Telehealth: Payer: Self-pay | Admitting: Physician Assistant

## 2021-08-30 DIAGNOSIS — J432 Centrilobular emphysema: Secondary | ICD-10-CM

## 2021-08-30 LAB — URINE CULTURE

## 2021-08-30 LAB — URINALYSIS, ROUTINE W REFLEX MICROSCOPIC

## 2021-08-30 LAB — C. TRACHOMATIS/N. GONORRHOEAE RNA
C. trachomatis RNA, TMA: NOT DETECTED
N. gonorrhoeae RNA, TMA: NOT DETECTED

## 2021-08-30 MED ORDER — ALBUTEROL SULFATE HFA 108 (90 BASE) MCG/ACT IN AERS: INHALATION_SPRAY | RESPIRATORY_TRACT | 1 refills | Status: DC

## 2021-08-30 NOTE — Progress Notes (Signed)
Normal colonizers of the internal and external genitalia. No treatment needed.

## 2021-08-30 NOTE — Telephone Encounter (Signed)
Ok for refill. Insurance may not pay for this. I would consider using your duo neb more.

## 2021-08-30 NOTE — Telephone Encounter (Signed)
Pharmacy will deliver if script is called in before 12:00. She says she gets anxious when she is not on the med.  Thanks.

## 2021-08-30 NOTE — Addendum Note (Signed)
Addended bySilvio Pate on: 08/30/2021 11:58 AM   Modules accepted: Orders

## 2021-08-30 NOTE — Telephone Encounter (Signed)
Pt called. She wants refill on her Pro-air because she accidentally dropped her current one in her toilet.

## 2021-08-30 NOTE — Telephone Encounter (Signed)
RX sent

## 2021-08-30 NOTE — Telephone Encounter (Signed)
Pt aware.     Thank you

## 2021-09-01 ENCOUNTER — Telehealth (INDEPENDENT_AMBULATORY_CARE_PROVIDER_SITE_OTHER): Payer: Medicare Other | Admitting: Physician Assistant

## 2021-09-01 ENCOUNTER — Encounter: Payer: Self-pay | Admitting: Physician Assistant

## 2021-09-01 DIAGNOSIS — N1832 Chronic kidney disease, stage 3b: Secondary | ICD-10-CM

## 2021-09-01 DIAGNOSIS — F418 Other specified anxiety disorders: Secondary | ICD-10-CM

## 2021-09-01 DIAGNOSIS — R636 Underweight: Secondary | ICD-10-CM

## 2021-09-01 DIAGNOSIS — Z638 Other specified problems related to primary support group: Secondary | ICD-10-CM

## 2021-09-01 DIAGNOSIS — J9611 Chronic respiratory failure with hypoxia: Secondary | ICD-10-CM

## 2021-09-01 DIAGNOSIS — R4589 Other symptoms and signs involving emotional state: Secondary | ICD-10-CM

## 2021-09-01 NOTE — Progress Notes (Signed)
..Virtual Visit via Telephone Note  I connected with Claire [name] on 09/02/21 at  3:00 PM EDT by telephone and verified that I am speaking with the correct person using two identifiers.  Location: Patient: home Provider: clinic  .[name] KitchenParticipating in visit:  Patient: Claire [name] Provider: Tandy Gaw PA-C Provider in training: Janae Bridgeman PA-Student   I discussed the limitations, risks, security and privacy concerns of performing an evaluation and management service by telephone and the availability of in person appointments. I also discussed with the patient that there may be a patient responsible charge related to this service. The patient expressed understanding and agreed to proceed.   History of Present Illness: Patient is a 70 year old underweight female with chronic respiratory failure, COPD, stage III kidney disease, anxiety, recurrent UTIs who presents to the clinic via telephone to discuss ongoing health.  Patient is doing fairly well.  She has started drinking boost and Ensure to increase her protein.  She has started trying to eat a little bit bigger meals and throughout the day.  Patient's breathing is stable.  She does continue to smoke.  She denies any new fever, chills, body aches or shortness of breath.  Patient is really concerned with her family tension.  It really bothers her.  She feels like her and her daughter's relationship is not what it should be.  It really causes her to worry and keep her up at night.  She would like to go over her labs.   .. Active Ambulatory Problems    Diagnosis Date Noted   PTSD (post-traumatic stress disorder) 11/10/2011   Neurosis, anxiety, panic type 11/10/2011   Acquired hypothyroidism 11/24/2014   BP (high blood pressure) 07/24/2012   Chronic use of benzodiazepine for therapeutic purpose 08/11/2015   Hyponatremia 12/19/2016   Hyperkalemia 12/19/2016   Centrilobular emphysema (HCC) 12/20/2016   Stage 3a chronic kidney disease (HCC)  01/16/2017   Iron deficiency anemia 01/16/2017   Anemia of chronic disease 05/11/2017   Low serum vitamin B12 05/14/2017   Vitamin D deficiency 05/14/2017   Depressed mood 05/14/2017   High serum parathyroid hormone (PTH) 07/19/2017   Normocytic anemia 07/19/2017   DOE (dyspnea on exertion) 05/09/2018   Recurrent UTI 05/07/2019   Leg cramps 05/29/2019   No energy 05/29/2019   History of hypokalemia 10/09/2019   Vaccine counseling 12/24/2019   Menopausal vaginal dryness 12/24/2019   Chronic rhinitis 01/31/2020   Chronic respiratory failure with hypoxia (HCC) 01/31/2020   Unintended weight loss 02/19/2020   Macrocytic anemia 03/10/2020   Elevated ferritin 03/10/2020   Irritable bowel syndrome with diarrhea 03/10/2020   Anxiety about health 03/23/2020   Pernicious anemia 03/23/2020   Malnutrition of mild degree (HCC) 05/06/2020   Body mass index (BMI) of 19 or less in adult 05/08/2020   Constipation 07/13/2020   Lower extremity edema 07/29/2020   Clostridium difficile colitis 09/11/2020   Mild protein-calorie malnutrition (HCC) 01/19/2021   Underweight 01/22/2021   History of Clostridioides difficile colitis 02/22/2021   Medication management 03/15/2021   Cloudy urine 04/23/2021   Stage 3b chronic kidney disease (HCC) 04/23/2021   Acute cystitis with hematuria 04/26/2021   History of UTI 06/02/2021   Nasal congestion 08/06/2021   Stress due to family tension 09/02/2021   Resolved Ambulatory Problems    Diagnosis Date Noted   Hypothyroid    Acute renal failure (HCC) 12/19/2016   Decreased hemoglobin 12/20/2016   Acute kidney injury (HCC) 03/17/2017   Sinobronchitis 06/16/2020   Past  Medical History:  Diagnosis Date   Acid reflux    Anxiety    High blood pressure     Observations/Objective: No acute distress No labored breathing Anxious mood     Assessment and Plan: [name] KitchenMarland KitchenDashanae was seen today for follow-up.  Diagnoses and all orders for this visit:  Anxiety  about health  Chronic respiratory failure with hypoxia (HCC)  Stage 3b chronic kidney disease (HCC)  Underweight  Stress due to family tension  She seems to be doing fairly well today. Confirmed her urinary symptoms were not due to infection with culture. Went over labs and reassured her they are stable. Encouraged her that she is doing better with diet and protein intake. Discussed family tension. Encouraged her to work on releasing what she cannot change. She declines medication to help with anxiety or stress. Follow up in 4 weeks.    Follow Up Instructions:    I discussed the assessment and treatment plan with the patient. The patient was provided an opportunity to ask questions and all were answered. The patient agreed with the plan and demonstrated an understanding of the instructions.   The patient was advised to call back or seek an in-person evaluation if the symptoms worsen or if the condition fails to improve as anticipated.  I provided 30 minutes of non-face-to-face time during this encounter.   Tandy Gaw, PA-C

## 2021-09-02 ENCOUNTER — Encounter: Payer: Self-pay | Admitting: Physician Assistant

## 2021-09-02 DIAGNOSIS — Z638 Other specified problems related to primary support group: Secondary | ICD-10-CM | POA: Insufficient documentation

## 2021-09-08 ENCOUNTER — Other Ambulatory Visit: Payer: Self-pay

## 2021-09-08 ENCOUNTER — Telehealth: Payer: Self-pay

## 2021-09-08 DIAGNOSIS — N1832 Chronic kidney disease, stage 3b: Secondary | ICD-10-CM

## 2021-09-08 NOTE — Telephone Encounter (Signed)
The patient called to request an UA order be placed for [facility] because she does not trust [facility] lab results. After speaking with [name], the order has been placed and the patient has been notified.

## 2021-09-16 ENCOUNTER — Telehealth: Payer: Self-pay

## 2021-09-16 NOTE — Telephone Encounter (Signed)
Angelisa called and left a message. She would like Jade to send in a different inhaler. She wants to stop the [facility] and replace it with a different inhaler.

## 2021-09-17 ENCOUNTER — Other Ambulatory Visit (HOSPITAL_COMMUNITY): Payer: Self-pay | Admitting: Psychiatry

## 2021-09-20 MED ORDER — BREZTRI AEROSPHERE 160-9-4.8 MCG/ACT IN AERO: 2.0000 | INHALATION_SPRAY | Freq: Two times a day (BID) | RESPIRATORY_TRACT | 11 refills | Status: DC

## 2021-09-20 NOTE — Telephone Encounter (Signed)
Claire [name] doesn't want to take Breztri. She wants to stay on the the Spiriva. She believes the [name] is no longer helping.   She states the [name] is too new and is worried about side effects to the medication. She states she lives alone and is worried something may happen to her and nobody would know.   She states she has lost her dog and is still very sad and lonely.

## 2021-09-20 NOTE — Telephone Encounter (Signed)
I sent Breztri to replace spiriva and dulera. 2 puffs twice a day.

## 2021-09-21 MED ORDER — FLUTICASONE-SALMETEROL 500-50 MCG/ACT IN AEPB: 1.0000 | INHALATION_SPRAY | Freq: Two times a day (BID) | RESPIRATORY_TRACT | 2 refills | Status: DC

## 2021-09-21 NOTE — Telephone Encounter (Signed)
She would like Advair.

## 2021-09-21 NOTE — Addendum Note (Signed)
Addended by: Jomarie Longs on: 09/21/2021 02:30 PM   Modules accepted: Orders

## 2021-09-21 NOTE — Telephone Encounter (Signed)
Comparable to dulera is BREO, symbicort, Advair. Which one would she like to try?

## 2021-09-21 NOTE — Telephone Encounter (Signed)
Patient advised.

## 2021-09-22 DIAGNOSIS — R0902 Hypoxemia: Secondary | ICD-10-CM | POA: Diagnosis not present

## 2021-09-24 ENCOUNTER — Telehealth (INDEPENDENT_AMBULATORY_CARE_PROVIDER_SITE_OTHER): Payer: Medicare Other | Admitting: Psychiatry

## 2021-09-24 ENCOUNTER — Encounter (HOSPITAL_COMMUNITY): Payer: Self-pay | Admitting: Psychiatry

## 2021-09-24 ENCOUNTER — Other Ambulatory Visit: Payer: Self-pay

## 2021-09-24 DIAGNOSIS — F411 Generalized anxiety disorder: Secondary | ICD-10-CM

## 2021-09-24 DIAGNOSIS — F4310 Post-traumatic stress disorder, unspecified: Secondary | ICD-10-CM

## 2021-09-24 MED ORDER — CLONAZEPAM 0.5 MG PO TABS: 0.5000 mg | ORAL_TABLET | Freq: Three times a day (TID) | ORAL | 1 refills | Status: DC

## 2021-09-24 NOTE — Progress Notes (Signed)
Patient ID: Claire [name], female   DOB: 1951/05/25, 70 y.o.   MRN: [id number]  [facility] Outpatient Follow up visit [address] [phone] 70 y.o.  09/24/2021 12:16 PM  Chief Complaint:  Anxiety follow up, med review  Virtual Visit via Telephone Note  I connected with Carl Best on 09/24/21 at 12:00 PM EDT by telephone and verified that I am speaking with the correct person using two identifiers.  Location: Patient: home Provider: home office   I discussed the limitations, risks, security and privacy concerns of performing an evaluation and management service by telephone and the availability of in person appointments. I also discussed with the patient that there may be a patient responsible charge related to this service. The patient expressed understanding and agreed to proceed.     I discussed the assessment and treatment plan with the patient. The patient was provided an opportunity to ask questions and all were answered. The patient agreed with the plan and demonstrated an understanding of the instructions.   The patient was advised to call back or seek an in-person evaluation if the symptoms worsen or if the condition fails to improve as anticipated.  I provided 15 minutes of non-face-to-face time during this encounter.    HPI  Patient is doing reasonable , her dog died so going thru grief Daughter is somewhat supportive SSRI didn thelp, just wants to keep klonopine for anxiety    Overall manageable on baseline  Modifying factor: breahting techniques.  grandkids  PTSD is related to her past some flashbacks. Distraction helps  Grief over her dog's death No side effects     Aggravating factors: past childhood No psychosis  Medical History; Past Medical History:  Diagnosis Date   Acid reflux    Anxiety    High blood pressure    High serum parathyroid hormone (PTH) 07/19/2017   96 06/28/2014, 162 12/29/16   Hypothyroid     PTSD (post-traumatic stress disorder)     Allergies: Allergies  Allergen Reactions   Bactrim [Sulfamethoxazole-Trimethoprim]     ACUTE RENAL fAILURE/HYPONATREMIA/HYPERKALEMIA   Macrodantin [Nitrofurantoin Macrocrystal] Rash   Trimethoprim     Fatigue/weakness/no appetite   Zithromax [Azithromycin]     diarrhea    Medications: Outpatient Encounter Medications as of 09/24/2021  Medication Sig   albuterol (PROAIR HFA) 108 (90 Base) MCG/ACT inhaler INHALE 1-2 PUFFS INTO THE LUNGS EVERY 4 (FOUR) HOURS AS NEEDED FOR WHEEZING OR SHORTNESS OF BREATH.   albuterol (PROVENTIL) (2.5 MG/3ML) 0.083% nebulizer solution 1 VIAL IN NEBULIZER EVERY 4 HOURS AS NEEDED WHEEZING OR FOR SHORTNESS OF BREATH   AMBULATORY NON FORMULARY MEDICATION Continuous stationary and portable oxygen tanks for use with ambulation. DX: COPD with hypoxia.   AMBULATORY NON FORMULARY MEDICATION Medication Name: full face mask to use with oxygen   atenolol (TENORMIN) 25 MG tablet TAKE ONE TABLET BY MOUTH EVERY DAY   Budeson-Glycopyrrol-Formoterol (BREZTRI AEROSPHERE) 160-9-4.8 MCG/ACT AERO Inhale 2 puffs into the lungs 2 (two) times daily.   cetirizine (ZYRTEC) 10 MG chewable tablet Chew 10 mg by mouth daily.   cholecalciferol (VITAMIN D) 25 MCG (1000 UT) tablet Take 1,000 Units by mouth daily.   clonazePAM (KLONOPIN) 0.5 MG tablet Take 1 tablet (0.5 mg total) by mouth 3 (three) times daily.   dicyclomine (BENTYL) 10 MG capsule Take 1 to 2 tablets up to three times a day.   DULERA 100-5 MCG/ACT AERO TAKE 2 PUFFS BY MOUTH TWICE A DAY  estradiol (ESTRACE VAGINAL) 0.1 MG/GM vaginal cream Place 1 Applicatorful vaginally 3 (three) times a week. And one pea-size amt applied to urethral opening.   famotidine (PEPCID) 20 MG tablet TAKE 1 TABLET BY MOUTH TWICE A DAY (Patient taking differently: Take 20 mg by mouth daily as needed.)   ferrous sulfate 325 (65 FE) MG EC tablet 1 tab PO TID every other day   fluticasone-salmeterol (WIXELA  INHUB) 500-50 MCG/ACT AEPB Inhale 1 puff into the lungs in the morning and at bedtime.   ipratropium-albuterol (DUONEB) 0.5-2.5 (3) MG/3ML SOLN Take 3 mLs by nebulization every 2 (two) hours as needed (wheeze, SOB).   Lactobacillus (FLORAJEN ACIDOPHILUS) CAPS Take 1 capsule by mouth daily.   levothyroxine (SYNTHROID) 75 MCG tablet TAKE 1 TABLET BY MOUTH EVERY DAY   Tiotropium Bromide Monohydrate (SPIRIVA RESPIMAT) 2.5 MCG/ACT AERS Inhale 2 puffs into the lungs daily.   [DISCONTINUED] clonazePAM (KLONOPIN) 0.5 MG tablet Take 1 tablet (0.5 mg total) by mouth 3 (three) times daily.   No facility-administered encounter medications on file as of 09/24/2021.    Family History; Family History  Problem Relation Age of Onset   Anxiety disorder Mother    OCD Daughter    Alcohol abuse Father    Anxiety disorder Sister        Labs:      Mental Status Examination;   Psychiatric Specialty Exam: Physical Exam  Review of Systems  Cardiovascular:  Negative for chest pain.  Psychiatric/Behavioral:  Negative for depression and suicidal ideas.    There were no vitals taken for this visit.There is no height or weight on file to calculate BMI.  General Appearance:   Eye Contact::    Speech:  Slow  Volume:  Decreased  Mood: fair  Affect:    Thought Process:  Coherent and intact  Orientation:  Full (Time, Place, and Person)  Thought Content:  Rumination  Suicidal Thoughts:  No  Homicidal Thoughts:  No  Memory:  Immediate;   Fair Recent;   Fair  Judgement:  Fair  Insight:  Shallow  Psychomotor Activity:  Normal  Concentration:  Fair  Recall:  Fair  Akathisia:  Negative  Handed:  Right  AIMS (if indicated):     Assets:  Social Support Vocational/Educational  Sleep:      Prior documentation, copy reviewed   Assessment: Axis I: Panic disorder. Possible PTSD. Rule out generalized anxiety disorder. Nicotine dependence  Axis II: Deferred  Axis III:  Past Medical History:   Diagnosis Date   Acid reflux    Anxiety    High blood pressure    High serum parathyroid hormone (PTH) 07/19/2017   96 06/28/2014, 162 12/29/16   Hypothyroid    PTSD (post-traumatic stress disorder)     Axis IV: Psychosocial. History of trauma   Treatment Plan and Summary: Prior documentation reviewed  Generalized anxiety disorder; manageable on klonopine  PTSD: some triggers and grief. Consider therapy, continue klonopine and distractions  Fu 63m.   Thresa Ross, MD 09/24/2021

## 2021-09-29 ENCOUNTER — Telehealth (INDEPENDENT_AMBULATORY_CARE_PROVIDER_SITE_OTHER): Payer: Medicare Other | Admitting: Physician Assistant

## 2021-09-29 ENCOUNTER — Encounter: Payer: Self-pay | Admitting: Physician Assistant

## 2021-09-29 DIAGNOSIS — F418 Other specified anxiety disorders: Secondary | ICD-10-CM

## 2021-09-29 DIAGNOSIS — J9611 Chronic respiratory failure with hypoxia: Secondary | ICD-10-CM

## 2021-09-29 DIAGNOSIS — J432 Centrilobular emphysema: Secondary | ICD-10-CM

## 2021-09-29 DIAGNOSIS — N1832 Chronic kidney disease, stage 3b: Secondary | ICD-10-CM | POA: Diagnosis not present

## 2021-09-29 MED ORDER — NICOTINE POLACRILEX 4 MG MT GUM: 4.0000 mg | CHEWING_GUM | OROMUCOSAL | 2 refills | Status: DC | PRN

## 2021-09-29 MED ORDER — ALBUTEROL SULFATE HFA 108 (90 BASE) MCG/ACT IN AERS: INHALATION_SPRAY | RESPIRATORY_TRACT | 5 refills | Status: DC

## 2021-09-29 MED ORDER — HYDROXYZINE HCL 10 MG PO TABS: 10.0000 mg | ORAL_TABLET | Freq: Three times a day (TID) | ORAL | 1 refills | Status: DC | PRN

## 2021-09-29 NOTE — Progress Notes (Signed)
..Virtual Visit via Telephone Note  I connected with Claire [name] on 09/29/21 at  3:00 PM EDT by telephone and verified that I am speaking with the correct person using two identifiers.  Location: Patient: home Provider: clinic  .[name] KitchenParticipating in visit:  Patient: Claire [name] Provider: Tandy Gaw PA-C   I discussed the limitations, risks, security and privacy concerns of performing an evaluation and management service by telephone and the availability of in person appointments. I also discussed with the patient that there may be a patient responsible charge related to this service. The patient expressed understanding and agreed to proceed.   History of Present Illness: Pt is a 70 yo female with complicated past medical history who calls into the clinic for montly scheduled phone call.   Overall she is doing ok today. Breathing is stable. She did not try the advair switch because she is concerned about risk of pneumonia. She feels more comfortable staying with dulera.   .. Active Ambulatory Problems    Diagnosis Date Noted   PTSD (post-traumatic stress disorder) 11/10/2011   Neurosis, anxiety, panic type 11/10/2011   Acquired hypothyroidism 11/24/2014   BP (high blood pressure) 07/24/2012   Chronic use of benzodiazepine for therapeutic purpose 08/11/2015   Hyponatremia 12/19/2016   Hyperkalemia 12/19/2016   Centrilobular emphysema (HCC) 12/20/2016   Stage 3a chronic kidney disease (HCC) 01/16/2017   Iron deficiency anemia 01/16/2017   Anemia of chronic disease 05/11/2017   Low serum vitamin B12 05/14/2017   Vitamin D deficiency 05/14/2017   Depressed mood 05/14/2017   High serum parathyroid hormone (PTH) 07/19/2017   Normocytic anemia 07/19/2017   DOE (dyspnea on exertion) 05/09/2018   Recurrent UTI 05/07/2019   Leg cramps 05/29/2019   No energy 05/29/2019   History of hypokalemia 10/09/2019   Vaccine counseling 12/24/2019   Menopausal vaginal dryness 12/24/2019   Chronic  rhinitis 01/31/2020   Chronic respiratory failure with hypoxia (HCC) 01/31/2020   Unintended weight loss 02/19/2020   Macrocytic anemia 03/10/2020   Elevated ferritin 03/10/2020   Irritable bowel syndrome with diarrhea 03/10/2020   Anxiety about health 03/23/2020   Pernicious anemia 03/23/2020   Malnutrition of mild degree (HCC) 05/06/2020   Body mass index (BMI) of 19 or less in adult 05/08/2020   Constipation 07/13/2020   Lower extremity edema 07/29/2020   Clostridium difficile colitis 09/11/2020   Mild protein-calorie malnutrition (HCC) 01/19/2021   Underweight 01/22/2021   History of Clostridioides difficile colitis 02/22/2021   Medication management 03/15/2021   Cloudy urine 04/23/2021   Stage 3b chronic kidney disease (HCC) 04/23/2021   Acute cystitis with hematuria 04/26/2021   History of UTI 06/02/2021   Nasal congestion 08/06/2021   Stress due to family tension 09/02/2021   Resolved Ambulatory Problems    Diagnosis Date Noted   Hypothyroid    Acute renal failure (HCC) 12/19/2016   Decreased hemoglobin 12/20/2016   Acute kidney injury (HCC) 03/17/2017   Sinobronchitis 06/16/2020   Past Medical History:  Diagnosis Date   Acid reflux    Anxiety    High blood pressure        Observations/Objective: No acute distress Normal mood No labored breathing.    Assessment and Plan: [name] KitchenMarland KitchenRashawnda was seen today for follow-up.  Diagnoses and all orders for this visit:  Centrilobular emphysema (HCC)  Chronic respiratory failure with hypoxia (HCC)  Stage 3b chronic kidney disease (HCC)  Pt will stay on dulera and spiriva. Continues to use continuous O2.   Stable today.  Spent time answering general questions about health and chronic diseases.    Follow Up Instructions:    I discussed the assessment and treatment plan with the patient. The patient was provided an opportunity to ask questions and all were answered. The patient agreed with the plan and  demonstrated an understanding of the instructions.   The patient was advised to call back or seek an in-person evaluation if the symptoms worsen or if the condition fails to improve as anticipated.  I provided 20 minutes of non-face-to-face time during this encounter.   Tandy Gaw, PA-C

## 2021-10-01 ENCOUNTER — Ambulatory Visit (INDEPENDENT_AMBULATORY_CARE_PROVIDER_SITE_OTHER): Payer: Medicare Other | Admitting: Family Medicine

## 2021-10-01 ENCOUNTER — Other Ambulatory Visit: Payer: Self-pay

## 2021-10-01 VITALS — BP 89/57 | HR 104 | Temp 98.5°F

## 2021-10-01 DIAGNOSIS — R531 Weakness: Secondary | ICD-10-CM

## 2021-10-01 DIAGNOSIS — R509 Fever, unspecified: Secondary | ICD-10-CM

## 2021-10-01 LAB — POCT URINALYSIS DIP (CLINITEK)
Bilirubin, UA: NEGATIVE
Blood, UA: NEGATIVE
Glucose, UA: NEGATIVE mg/dL
Ketones, POC UA: NEGATIVE mg/dL
Nitrite, UA: NEGATIVE
Spec Grav, UA: 1.01 (ref 1.010–1.025)
Urobilinogen, UA: 0.2 E.U./dL
pH, UA: 6 (ref 5.0–8.0)

## 2021-10-01 LAB — POCT INFLUENZA A/B
Influenza A, POC: NEGATIVE
Influenza B, POC: NEGATIVE

## 2021-10-01 NOTE — Progress Notes (Signed)
Established Patient Office Visit  Subjective:  Patient ID: Claire [name], female    DOB: [birth year]  Age: 70 y.o. MRN: [id number]  CC:  Chief Complaint  Patient presents with   Fatigue   Fever    HPI Claire [name] presents for weakness last night and a temp of 100.7. She still feels weak today. However her temp today is 98.5. An at home Covid test was negative.    She would like to be tested for a UTI. Denies any urinary symptoms.   Past Medical History:  Diagnosis Date   Acid reflux    Anxiety    High blood pressure    High serum parathyroid hormone (PTH) 07/19/2017   96 06/28/2014, 162 12/29/16   Hypothyroid    PTSD (post-traumatic stress disorder)     No past surgical history on file.  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 She enjoys sewing and painting.   Social Determinants of Health   Financial Resource Strain: Low Risk    Difficulty of Paying Living Expenses: Not hard at all  Food Insecurity: No Food Insecurity   Worried About Charity fundraiser in the Last Year: Never true   [place] in the Last Year: Never true  Transportation Needs: No Transportation Needs   Lack of Transportation (Medical):  No   Lack of Transportation (Non-Medical): No  Physical Activity: Inactive   Days of Exercise per Week: 0 days   Minutes of Exercise per Session: 0 min  Stress: No Stress Concern Present   Feeling of Stress : Not at all  Social Connections: Socially Isolated   Frequency of Communication with Friends and Family: More than three times a week   Frequency of Social Gatherings with Friends and Family: Never   Attends Religious Services: Never   Marine scientist or Organizations: No   Attends Music therapist: Never   Marital Status: Divorced  Human resources officer Violence: Not At Risk   Fear of Current or Ex-Partner: No   Emotionally Abused: No   Physically Abused: No   Sexually Abused: No    Outpatient Medications Prior to Visit  Medication Sig Dispense Refill   albuterol (PROAIR HFA) 108 (90 Base) MCG/ACT inhaler INHALE 1-2 PUFFS INTO THE LUNGS EVERY 4 (FOUR) HOURS AS NEEDED FOR WHEEZING OR SHORTNESS OF BREATH. 18 g 5   AMBULATORY NON FORMULARY MEDICATION Continuous stationary and portable oxygen tanks for use with ambulation. DX: COPD with hypoxia. 4 Device 11   AMBULATORY NON FORMULARY  MEDICATION Medication Name: full face mask to use with oxygen 1 each 2   atenolol (TENORMIN) 25 MG tablet TAKE ONE TABLET BY MOUTH EVERY DAY 90 tablet 2   Budesonide ER 9 MG TB24 Take by mouth.     cetirizine (ZYRTEC) 10 MG chewable tablet Chew 10 mg by mouth daily.     cholecalciferol (VITAMIN D) 25 MCG (1000 UT) tablet Take 1,000 Units by mouth daily.     clonazePAM (KLONOPIN) 0.5 MG tablet Take 1 tablet (0.5 mg total) by mouth 3 (three) times daily. 90 tablet 1   dicyclomine (BENTYL) 10 MG capsule Take 1 to 2 tablets up to three times a day. 540 capsule 1   DULERA 100-5 MCG/ACT AERO TAKE 2 PUFFS BY MOUTH TWICE A DAY 13 g 6   estradiol (ESTRACE VAGINAL) 0.1 MG/GM vaginal cream Place 1 Applicatorful vaginally 3 (three) times a week. And one pea-size amt applied to urethral opening. 42.5  g 1   famotidine (PEPCID) 20 MG tablet TAKE 1 TABLET BY MOUTH TWICE A DAY (Patient taking differently: Take 20 mg by mouth daily as needed.) 180 tablet 1   ferrous sulfate 325 (65 FE) MG EC tablet 1 tab PO TID every other day 90 tablet 11   hydrOXYzine (ATARAX/VISTARIL) 10 MG tablet Take 1 tablet (10 mg total) by mouth 3 (three) times daily as needed. For anxiety. 90 tablet 1   ipratropium-albuterol (DUONEB) 0.5-2.5 (3) MG/3ML SOLN Take 3 mLs by nebulization every 2 (two) hours as needed (wheeze, SOB). 60 mL 1   Lactobacillus (FLORAJEN ACIDOPHILUS) CAPS Take 1 capsule by mouth daily.     levothyroxine (SYNTHROID) 75 MCG tablet TAKE 1 TABLET BY MOUTH EVERY DAY 90 tablet 0   nicotine polacrilex (NICORETTE) 4 MG gum Take 1 each (4 mg total) by mouth as needed for smoking cessation. 120 tablet 2   Tiotropium Bromide Monohydrate (SPIRIVA RESPIMAT) 2.5 MCG/ACT AERS Inhale 2 puffs into the lungs daily. 12 g 1   No facility-administered medications prior to visit.    Allergies  Allergen Reactions   Bactrim [Sulfamethoxazole-Trimethoprim]     ACUTE RENAL fAILURE/HYPONATREMIA/HYPERKALEMIA   Macrodantin [Nitrofurantoin Macrocrystal] Rash   Trimethoprim     Fatigue/weakness/no appetite   Zithromax [Azithromycin]     diarrhea    ROS Review of Systems    Objective:    Physical Exam  BP (!) 89/57   Pulse (!) 104   Temp 98.5 F (36.9 C) (Oral)   SpO2 97%  Wt Readings from Last 3 Encounters:  08/25/21 90 lb (40.8 kg)  08/05/21 92 lb (41.7 kg)  05/27/21 92 lb (41.7 kg)     Health Maintenance Due  Topic Date Due   Hepatitis C Screening  Never done   Zoster Vaccines- Shingrix (1 of 2) Never done   MAMMOGRAM  Never done   DEXA SCAN  Never done   COLON CANCER SCREENING ANNUAL FOBT  07/10/2018   Pneumonia Vaccine 87+ Years old (2 - PCV) 11/06/2019   INFLUENZA VACCINE  07/05/2021    There are no preventive care reminders to display for this patient.  Lab Results  Component Value  Date   TSH 1.530 03/03/2020   Lab Results  Component Value Date   WBC 7.9 08/25/2021   HGB 9.2 (L) 08/25/2021   HCT 27.8 (L) 08/25/2021   MCV 97.2 08/25/2021   PLT 337 08/25/2021   Lab Results  Component Value Date   NA 133 (L) 08/25/2021   K 5.2  08/25/2021   CO2 37 (H) 08/25/2021   GLUCOSE 87 08/25/2021   BUN 13 08/25/2021   CREATININE 1.11 (H) 08/25/2021   BILITOT 0.4 08/25/2021   ALKPHOS 88 04/27/2021   AST 13 08/25/2021   ALT 5 (L) 08/25/2021   PROT 7.5 08/25/2021   ALBUMIN 4.2 04/27/2021   CALCIUM 9.7 08/25/2021   EGFR 53 (L) 08/25/2021   Lab Results  Component Value Date   CHOL 174 03/03/2020   Lab Results  Component Value Date   HDL 55 03/03/2020   Lab Results  Component Value Date   LDLCALC 105 (H) 03/03/2020   Lab Results  Component Value Date   TRIG 75 03/03/2020   Lab Results  Component Value Date   CHOLHDL 3.2 03/03/2020   No results found for: HGBA1C    Assessment & Plan:  Weakness - U/A - small - leu. Sent urine for culture.   At home Covid test - negative  POCT for influenza A and B - Negative   Problem List Items Addressed This Visit   None Visit Diagnoses     Weakness    -  Primary   Relevant Orders   POCT URINALYSIS DIP (CLINITEK) (Completed)   Urine Culture   POCT Influenza A/B   Fever, unspecified fever cause       Relevant Orders   POCT URINALYSIS DIP (CLINITEK) (Completed)   Urine Culture   POCT Influenza A/B       No orders of the defined types were placed in this encounter.   Follow-up: No follow-ups on file.    Lavell Luster, [place]

## 2021-10-01 NOTE — Progress Notes (Signed)
Acute Office Visit  Subjective:    Patient ID: Claire [name], female    DOB: 05-03-1951, 70 y.o.   MRN: [id number]  Chief Complaint  Patient presents with   Fatigue   Fever    Fever   Patient is in today for fever and fatigue.   Patient reporting a few days of fever and fatigue. No changes to cough or respiratory status, no chest pain, nasal congestion, sinus pressure, dysuria, increased urinary frequency or urgency, flank/back pain, abdominal pain. GI/GU changes. States she gets frequent UTIs and is wondering if that may be causing the fever.   Past Medical History:  Diagnosis Date   Acid reflux    Anxiety    High blood pressure    High serum parathyroid hormone (PTH) 07/19/2017   96 06/28/2014, 162 12/29/16   Hypothyroid    PTSD (post-traumatic stress disorder)     No past surgical history on file.  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 She enjoys sewing and painting.   Social Determinants of Health   Financial Resource Strain: Low Risk    Difficulty of Paying Living Expenses: Not hard at all  Food Insecurity: No Food Insecurity   Worried About Charity fundraiser in the Last Year: Never true   [place]  in the Last Year: Never true  Transportation Needs: No Transportation Needs   Lack of Transportation (Medical): No   Lack of Transportation (Non-Medical): No  Physical Activity: Inactive   Days of Exercise per Week: 0 days   Minutes of Exercise per Session: 0 min  Stress: No Stress Concern Present   Feeling of Stress : Not at all  Social Connections: Socially Isolated   Frequency of Communication with Friends and Family: More than three times a week   Frequency of Social Gatherings with Friends and Family: Never   Attends Religious Services: Never   Marine scientist or Organizations: No   Attends Music therapist: Never   Marital Status: Divorced  Human resources officer Violence: Not At Risk   Fear of Current or Ex-Partner: No   Emotionally Abused: No   Physically Abused: No   Sexually Abused: No    Outpatient Medications Prior to Visit  Medication Sig Dispense Refill   albuterol (PROAIR HFA) 108 (90 Base) MCG/ACT inhaler INHALE 1-2 PUFFS INTO THE LUNGS EVERY 4 (FOUR) HOURS AS NEEDED FOR WHEEZING OR SHORTNESS OF BREATH. 18 g 5   AMBULATORY NON FORMULARY MEDICATION Continuous stationary and portable oxygen tanks for use  with ambulation. DX: COPD with hypoxia. 4 Device 11   AMBULATORY NON FORMULARY MEDICATION Medication Name: full face mask to use with oxygen 1 each 2   atenolol (TENORMIN) 25 MG tablet TAKE ONE TABLET BY MOUTH EVERY DAY 90 tablet 2   Budesonide ER 9 MG TB24 Take by mouth.     cetirizine (ZYRTEC) 10 MG chewable tablet Chew 10 mg by mouth daily.     cholecalciferol (VITAMIN D) 25 MCG (1000 UT) tablet Take 1,000 Units by mouth daily.     clonazePAM (KLONOPIN) 0.5 MG tablet Take 1 tablet (0.5 mg total) by mouth 3 (three) times daily. 90 tablet 1   dicyclomine (BENTYL) 10 MG capsule Take 1 to 2 tablets up to three times a day. 540 capsule 1   DULERA 100-5 MCG/ACT AERO TAKE 2 PUFFS BY MOUTH TWICE A DAY 13 g 6   estradiol (ESTRACE VAGINAL) 0.1 MG/GM vaginal  cream Place 1 Applicatorful vaginally 3 (three) times a week. And one pea-size amt applied to urethral opening. 42.5 g 1   famotidine (PEPCID) 20 MG tablet TAKE 1 TABLET BY MOUTH TWICE A DAY (Patient taking differently: Take 20 mg by mouth daily as needed.) 180 tablet 1   ferrous sulfate 325 (65 FE) MG EC tablet 1 tab PO TID every other day 90 tablet 11   hydrOXYzine (ATARAX/VISTARIL) 10 MG tablet Take 1 tablet (10 mg total) by mouth 3 (three) times daily as needed. For anxiety. 90 tablet 1   ipratropium-albuterol (DUONEB) 0.5-2.5 (3) MG/3ML SOLN Take 3 mLs by nebulization every 2 (two) hours as needed (wheeze, SOB). 60 mL 1   Lactobacillus (FLORAJEN ACIDOPHILUS) CAPS Take 1 capsule by mouth daily.     levothyroxine (SYNTHROID) 75 MCG tablet TAKE 1 TABLET BY MOUTH EVERY DAY 90 tablet 0   nicotine polacrilex (NICORETTE) 4 MG gum Take 1 each (4 mg total) by mouth as needed for smoking cessation. 120 tablet 2   Tiotropium Bromide Monohydrate (SPIRIVA RESPIMAT) 2.5 MCG/ACT AERS Inhale 2 puffs into the lungs daily. 12 g 1   No facility-administered medications prior to visit.    Allergies  Allergen Reactions   Bactrim [Sulfamethoxazole-Trimethoprim]     ACUTE RENAL fAILURE/HYPONATREMIA/HYPERKALEMIA   Macrodantin [Nitrofurantoin Macrocrystal] Rash   Trimethoprim     Fatigue/weakness/no appetite   Zithromax [Azithromycin]     diarrhea    Review of Systems  Constitutional:  Positive for fever.  All review of systems negative except what is listed in the HPI     Objective:    Physical Exam Vitals reviewed.  Constitutional:      Appearance: Normal appearance.  HENT:     Head: Normocephalic and atraumatic.  Cardiovascular:     Rate and Rhythm: Regular rhythm.     Heart sounds: Normal heart sounds.  Pulmonary:     Effort: Pulmonary effort is normal.     Breath sounds: Normal breath sounds.     Comments: Chronic O2 Abdominal:     General: Abdomen is flat. Bowel sounds are normal.  There is no distension.     Palpations: Abdomen is soft.     Tenderness: There is no abdominal tenderness. There is no right CVA tenderness, left CVA tenderness or guarding.  Musculoskeletal:     Cervical back: Normal range of motion and neck supple. No tenderness.  Skin:    Findings: No rash.  Neurological:     Mental Status: She is alert and oriented to person, place, and time.  Psychiatric:  Mood and Affect: Mood normal.        Behavior: Behavior normal.        Thought Content: Thought content normal.        Judgment: Judgment normal.    BP (!) 89/57   Pulse (!) 104   Temp 98.5 F (36.9 C) (Oral)   SpO2 97%  Wt Readings from Last 3 Encounters:  08/25/21 90 lb (40.8 kg)  08/05/21 92 lb (41.7 kg)  05/27/21 92 lb (41.7 kg)    Health Maintenance Due  Topic Date Due   Hepatitis C Screening  Never done   Zoster Vaccines- Shingrix (1 of 2) Never done   MAMMOGRAM  Never done   DEXA SCAN  Never done   COLON CANCER SCREENING ANNUAL FOBT  07/10/2018   Pneumonia Vaccine 50+ Years old (2 - PCV) 11/06/2019   INFLUENZA VACCINE  07/05/2021    There are no preventive care reminders to display for this patient.   Lab Results  Component Value Date   TSH 1.530 03/03/2020   Lab Results  Component Value Date   WBC 7.9 08/25/2021   HGB 9.2 (L) 08/25/2021   HCT 27.8 (L) 08/25/2021   MCV 97.2 08/25/2021   PLT 337 08/25/2021   Lab Results  Component Value Date   NA 133 (L) 08/25/2021   K 5.2 08/25/2021   CO2 37 (H) 08/25/2021   GLUCOSE 87 08/25/2021   BUN 13 08/25/2021   CREATININE 1.11 (H) 08/25/2021   BILITOT 0.4 08/25/2021   ALKPHOS 88 04/27/2021   AST 13 08/25/2021   ALT 5 (L) 08/25/2021   PROT 7.5 08/25/2021   ALBUMIN 4.2 04/27/2021   CALCIUM 9.7 08/25/2021   EGFR 53 (L) 08/25/2021   Lab Results  Component Value Date   CHOL 174 03/03/2020   Lab Results  Component Value Date   HDL 55 03/03/2020   Lab Results  Component Value Date   LDLCALC 105 (H)  03/03/2020   Lab Results  Component Value Date   TRIG 75 03/03/2020   Lab Results  Component Value Date   CHOLHDL 3.2 03/03/2020   No results found for: HGBA1C     Assessment & Plan:   Problem List Items Addressed This Visit   None Visit Diagnoses     Weakness    -  Primary   Relevant Orders   POCT URINALYSIS DIP (CLINITEK) (Completed)   Urine Culture   POCT Influenza A/B (Completed)   Fever, unspecified fever cause       Relevant Orders   POCT URINALYSIS DIP (CLINITEK) (Completed)   Urine Culture   POCT Influenza A/B (Completed)      Rapid COVID test = negative Flu test = negative UA = small leuks, otherwise negative  No acute distress or alarm findings on exam. Sending urine for culture. Will  let her know results and if we need to add antibiotics. For now, continue supportive measures, rest, hydration, good hygiene, OTC meds as needed, etc. Patient aware of signs/symptoms requiring further/urgent evaluation.  Follow-up as needed.    Terrilyn Saver, NP

## 2021-10-03 DIAGNOSIS — J449 Chronic obstructive pulmonary disease, unspecified: Secondary | ICD-10-CM | POA: Diagnosis not present

## 2021-10-03 LAB — URINE CULTURE
MICRO NUMBER:: 12568905
SPECIMEN QUALITY:: ADEQUATE

## 2021-10-04 ENCOUNTER — Other Ambulatory Visit: Payer: Self-pay | Admitting: Physician Assistant

## 2021-10-04 ENCOUNTER — Telehealth: Payer: Self-pay

## 2021-10-04 ENCOUNTER — Encounter: Payer: Self-pay | Admitting: Physician Assistant

## 2021-10-04 ENCOUNTER — Telehealth (INDEPENDENT_AMBULATORY_CARE_PROVIDER_SITE_OTHER): Payer: Medicare Other | Admitting: Physician Assistant

## 2021-10-04 DIAGNOSIS — J432 Centrilobular emphysema: Secondary | ICD-10-CM

## 2021-10-04 DIAGNOSIS — N898 Other specified noninflammatory disorders of vagina: Secondary | ICD-10-CM

## 2021-10-04 DIAGNOSIS — D638 Anemia in other chronic diseases classified elsewhere: Secondary | ICD-10-CM | POA: Diagnosis not present

## 2021-10-04 DIAGNOSIS — N1831 Chronic kidney disease, stage 3a: Secondary | ICD-10-CM | POA: Diagnosis not present

## 2021-10-04 NOTE — Telephone Encounter (Signed)
Ok to put her on at 11:30 let her know I will call her around or after 11:30

## 2021-10-04 NOTE — Telephone Encounter (Signed)
Scheduled

## 2021-10-04 NOTE — Progress Notes (Signed)
..Virtual Visit via Telephone Note  I connected with Claire [name] on 10/04/21 at 11:30 AM EDT by telephone and verified that I am speaking with the correct person using two identifiers.  Location: Patient: home Provider: clinic  .[name] KitchenParticipating in visit:  Patient: Claire [name] Provider: Tandy Gaw PA-C    I discussed the limitations, risks, security and privacy concerns of performing an evaluation and management service by telephone and the availability of in person appointments. I also discussed with the patient that there may be a patient responsible charge related to this service. The patient expressed understanding and agreed to proceed.   History of Present Illness: Pt is a 69 yo female who calls into the clinic to discuss urine culture results. She continues to have vaginal irritation. She admits to vaginal dryness.   .. Active Ambulatory Problems    Diagnosis Date Noted   PTSD (post-traumatic stress disorder) 11/10/2011   Neurosis, anxiety, panic type 11/10/2011   Acquired hypothyroidism 11/24/2014   BP (high blood pressure) 07/24/2012   Chronic use of benzodiazepine for therapeutic purpose 08/11/2015   Hyponatremia 12/19/2016   Hyperkalemia 12/19/2016   Centrilobular emphysema (HCC) 12/20/2016   Stage 3a chronic kidney disease (HCC) 01/16/2017   Iron deficiency anemia 01/16/2017   Anemia of chronic disease 05/11/2017   Low serum vitamin B12 05/14/2017   Vitamin D deficiency 05/14/2017   Depressed mood 05/14/2017   High serum parathyroid hormone (PTH) 07/19/2017   Normocytic anemia 07/19/2017   DOE (dyspnea on exertion) 05/09/2018   Recurrent UTI 05/07/2019   Leg cramps 05/29/2019   No energy 05/29/2019   History of hypokalemia 10/09/2019   Vaccine counseling 12/24/2019   Menopausal vaginal dryness 12/24/2019   Chronic rhinitis 01/31/2020   Chronic respiratory failure with hypoxia (HCC) 01/31/2020   Unintended weight loss 02/19/2020   Macrocytic anemia 03/10/2020    Elevated ferritin 03/10/2020   Irritable bowel syndrome with diarrhea 03/10/2020   Anxiety about health 03/23/2020   Pernicious anemia 03/23/2020   Malnutrition of mild degree (HCC) 05/06/2020   Body mass index (BMI) of 19 or less in adult 05/08/2020   Constipation 07/13/2020   Lower extremity edema 07/29/2020   Clostridium difficile colitis 09/11/2020   Mild protein-calorie malnutrition (HCC) 01/19/2021   Underweight 01/22/2021   History of Clostridioides difficile colitis 02/22/2021   Medication management 03/15/2021   Cloudy urine 04/23/2021   Stage 3b chronic kidney disease (HCC) 04/23/2021   Acute cystitis with hematuria 04/26/2021   History of UTI 06/02/2021   Nasal congestion 08/06/2021   Stress due to family tension 09/02/2021   Resolved Ambulatory Problems    Diagnosis Date Noted   Hypothyroid    Acute renal failure (HCC) 12/19/2016   Decreased hemoglobin 12/20/2016   Acute kidney injury (HCC) 03/17/2017   Sinobronchitis 06/16/2020   Past Medical History:  Diagnosis Date   Acid reflux    Anxiety    High blood pressure     Observations/Objective: No acute distress Anxiety   Assessment and Plan: [name] KitchenMarland KitchenDiagnoses and all orders for this visit:  Vaginal irritation -     Basic metabolic panel -     CBC  Anemia of chronic disease -     Basic metabolic panel -     CBC  Stage 3a chronic kidney disease (HCC)   Reassured patient that UA culture showed no abnormal bacteria. No need for abx.  Work on vaginal irritation with coconut oil.  Labs for anemia/kidney recheck.    Follow Up Instructions:  I discussed the assessment and treatment plan with the patient. The patient was provided an opportunity to ask questions and all were answered. The patient agreed with the plan and demonstrated an understanding of the instructions.   The patient was advised to call back or seek an in-person evaluation if the symptoms worsen or if the condition fails to improve as  anticipated.  I provided 20 minutes of non-face-to-face time during this encounter.   Iran Planas, PA-C

## 2021-10-04 NOTE — Telephone Encounter (Signed)
Claire [name] complains of urinary frequency and feels very weak. She wants to speak with [facility]. I offered her an appointment for tomorrow. She said "great I have to wait another day".

## 2021-10-05 DIAGNOSIS — D638 Anemia in other chronic diseases classified elsewhere: Secondary | ICD-10-CM | POA: Diagnosis not present

## 2021-10-06 ENCOUNTER — Telehealth: Payer: Self-pay | Admitting: Physician Assistant

## 2021-10-06 DIAGNOSIS — D638 Anemia in other chronic diseases classified elsewhere: Secondary | ICD-10-CM

## 2021-10-06 LAB — CBC
Hematocrit: 24.6 % — ABNORMAL LOW (ref 34.0–46.6)
Hemoglobin: 8.3 g/dL — ABNORMAL LOW (ref 11.1–15.9)
MCH: 31.4 pg (ref 26.6–33.0)
MCHC: 33.7 g/dL (ref 31.5–35.7)
MCV: 93 fL (ref 79–97)
Platelets: 449 10*3/uL (ref 150–450)
RBC: 2.64 x10E6/uL — CL (ref 3.77–5.28)
RDW: 10.9 % — ABNORMAL LOW (ref 11.7–15.4)
WBC: 8.4 10*3/uL (ref 3.4–10.8)

## 2021-10-06 LAB — BASIC METABOLIC PANEL
BUN/Creatinine Ratio: 13 (ref 12–28)
BUN: 12 mg/dL (ref 8–27)
CO2: 30 mmol/L — ABNORMAL HIGH (ref 20–29)
Calcium: 9.1 mg/dL (ref 8.7–10.3)
Chloride: 92 mmol/L — ABNORMAL LOW (ref 96–106)
Creatinine, Ser: 0.96 mg/dL (ref 0.57–1.00)
Glucose: 102 mg/dL — ABNORMAL HIGH (ref 70–99)
Potassium: 4.2 mmol/L (ref 3.5–5.2)
Sodium: 136 mmol/L (ref 134–144)
eGFR: 64 mL/min/{1.73_m2} (ref >59)

## 2021-10-06 NOTE — Telephone Encounter (Signed)
Referral placed.

## 2021-10-06 NOTE — Telephone Encounter (Signed)
It looks like patient reviewed results: Seen by patient Claire [name] on 10/06/2021 10:56 AM

## 2021-10-06 NOTE — Telephone Encounter (Addendum)
Pt called back. She wants to see the hematologist(she had problems responding via mychart).  Thank you

## 2021-10-06 NOTE — Progress Notes (Signed)
Naketa,   Your kidney function improved some from one month ago.  Your WBC looks great, no signs of infection.  Your hemoglobin has dropped some and your RBC production is low. You have been having low RBC production but this is lower.  I think seeing hematology referral would be a good idea to look at this and think about ways increase production. Are you ok with this referral.

## 2021-10-06 NOTE — Telephone Encounter (Signed)
Pt called.  She is upset that she is not able to see her lab results; this has not happened to her before.

## 2021-10-13 ENCOUNTER — Encounter: Payer: Self-pay | Admitting: Physician Assistant

## 2021-10-14 ENCOUNTER — Ambulatory Visit (HOSPITAL_COMMUNITY): Payer: Medicare Other | Admitting: Licensed Clinical Social Worker

## 2021-10-18 ENCOUNTER — Encounter: Payer: Self-pay | Admitting: Physician Assistant

## 2021-10-18 ENCOUNTER — Telehealth (INDEPENDENT_AMBULATORY_CARE_PROVIDER_SITE_OTHER): Payer: Medicare Other | Admitting: Physician Assistant

## 2021-10-18 DIAGNOSIS — F418 Other specified anxiety disorders: Secondary | ICD-10-CM

## 2021-10-18 DIAGNOSIS — Z7185 Encounter for immunization safety counseling: Secondary | ICD-10-CM | POA: Diagnosis not present

## 2021-10-18 NOTE — Progress Notes (Signed)
..Virtual Visit via Telephone Note  I connected with Claire [name] on 10/18/21 at  3:40 PM EST by telephone and verified that I am speaking with the correct person using two identifiers.  Location: Patient: home Provider: clinic  .[name] KitchenParticipating in visit:  Patient: Claire [name] Provider: Tandy Gaw PA-C    I discussed the limitations, risks, security and privacy concerns of performing an evaluation and management service by telephone and the availability of in person appointments. I also discussed with the patient that there may be a patient responsible charge related to this service. The patient expressed understanding and agreed to proceed.   History of Present Illness: Pt is a 70 yo female who calls into the clinic to discuss preventative antibody infusions. She has heard about Evusheld and wanted to know if she qualified. She does not want to get vaccine. She has not had covid.     .. Active Ambulatory Problems    Diagnosis Date Noted   PTSD (post-traumatic stress disorder) 11/10/2011   Neurosis, anxiety, panic type 11/10/2011   Acquired hypothyroidism 11/24/2014   BP (high blood pressure) 07/24/2012   Chronic use of benzodiazepine for therapeutic purpose 08/11/2015   Hyponatremia 12/19/2016   Hyperkalemia 12/19/2016   Centrilobular emphysema (HCC) 12/20/2016   Stage 3a chronic kidney disease (HCC) 01/16/2017   Iron deficiency anemia 01/16/2017   Anemia of chronic disease 05/11/2017   Low serum vitamin B12 05/14/2017   Vitamin D deficiency 05/14/2017   Depressed mood 05/14/2017   High serum parathyroid hormone (PTH) 07/19/2017   Normocytic anemia 07/19/2017   DOE (dyspnea on exertion) 05/09/2018   Recurrent UTI 05/07/2019   Leg cramps 05/29/2019   No energy 05/29/2019   History of hypokalemia 10/09/2019   Vaccine counseling 12/24/2019   Menopausal vaginal dryness 12/24/2019   Chronic rhinitis 01/31/2020   Chronic respiratory failure with hypoxia (HCC) 01/31/2020    Unintended weight loss 02/19/2020   Macrocytic anemia 03/10/2020   Elevated ferritin 03/10/2020   Irritable bowel syndrome with diarrhea 03/10/2020   Anxiety about health 03/23/2020   Pernicious anemia 03/23/2020   Malnutrition of mild degree (HCC) 05/06/2020   Body mass index (BMI) of 19 or less in adult 05/08/2020   Constipation 07/13/2020   Lower extremity edema 07/29/2020   Clostridium difficile colitis 09/11/2020   Mild protein-calorie malnutrition (HCC) 01/19/2021   Underweight 01/22/2021   History of Clostridioides difficile colitis 02/22/2021   Medication management 03/15/2021   Cloudy urine 04/23/2021   Stage 3b chronic kidney disease (HCC) 04/23/2021   Acute cystitis with hematuria 04/26/2021   History of UTI 06/02/2021   Nasal congestion 08/06/2021   Stress due to family tension 09/02/2021   Resolved Ambulatory Problems    Diagnosis Date Noted   Hypothyroid    Acute renal failure (HCC) 12/19/2016   Decreased hemoglobin 12/20/2016   Acute kidney injury (HCC) 03/17/2017   Sinobronchitis 06/16/2020   Past Medical History:  Diagnosis Date   Acid reflux    Anxiety    High blood pressure     Observations/Objective: No acute distress    Assessment and Plan: [name] KitchenMarland KitchenDenissa was seen today for advice only.  Diagnoses and all orders for this visit:  Anxiety about health  Vaccine counseling  I did not know anything about Evusheld. I will send to pharmacist to see if patient would qualify and where to get it .    Follow Up Instructions:    I discussed the assessment and treatment plan with the patient. The patient  was provided an opportunity to ask questions and all were answered. The patient agreed with the plan and demonstrated an understanding of the instructions.   The patient was advised to call back or seek an in-person evaluation if the symptoms worsen or if the condition fails to improve as anticipated.  I provided 15 minutes of non-face-to-face time  during this encounter.   Iran Planas, PA-C

## 2021-10-18 NOTE — Progress Notes (Signed)
Clinical Pharmacist Documentation  Providing response to question concerning eligibility for Evusheld, a monoclonal antibody under EUA for pre-exposure prophylaxis of covid-19.   Clinical Review & Recommendation: Per my review, Claire [name] is not considered moderate or severely immune compromised. With risk/benefit ratio taken into consideration as well as appropriate utilization of EUA interventions, I do not recommend use of Evusheld in this case.  Evusheld is not a substitute for vaccination in those whom covid-19 vaccination is recommended.  Rationale and Cited References:  Information on appropriate use (including disclaimers regarding data showing reduced efficacy for Omicron variants), as well as definitions for classifying "moderate or severely immune compromised" can be found at:   https://jones-murray.org/.html  See below for eligibility criteria cited in product package insert:     Thank you for allowing me to participate in the care for this patient. Please reach out with any further questions or concerns.  Lynnda Shields, PharmD Clinical Pharmacist [facility] [phone]

## 2021-10-19 ENCOUNTER — Encounter: Payer: Self-pay | Admitting: Physician Assistant

## 2021-10-20 NOTE — Telephone Encounter (Signed)
I would say that if you feel like she is truly immunosuppressed and might potentially not mount a great response to the COVID-vaccine that I do think she is a candidate.  Can be ordered through epic and it would be repeated every 6 months .it is IM injections.  You have to doc consent as below and then order under Smartset:

## 2021-10-23 DIAGNOSIS — R0902 Hypoxemia: Secondary | ICD-10-CM | POA: Diagnosis not present

## 2021-10-26 ENCOUNTER — Telehealth (INDEPENDENT_AMBULATORY_CARE_PROVIDER_SITE_OTHER): Payer: Medicare Other | Admitting: Physician Assistant

## 2021-10-26 ENCOUNTER — Encounter: Payer: Self-pay | Admitting: Physician Assistant

## 2021-10-26 DIAGNOSIS — J432 Centrilobular emphysema: Secondary | ICD-10-CM | POA: Diagnosis not present

## 2021-10-26 DIAGNOSIS — J441 Chronic obstructive pulmonary disease with (acute) exacerbation: Secondary | ICD-10-CM

## 2021-10-26 DIAGNOSIS — E875 Hyperkalemia: Secondary | ICD-10-CM

## 2021-10-26 DIAGNOSIS — E871 Hypo-osmolality and hyponatremia: Secondary | ICD-10-CM | POA: Diagnosis not present

## 2021-10-26 DIAGNOSIS — E039 Hypothyroidism, unspecified: Secondary | ICD-10-CM

## 2021-10-26 DIAGNOSIS — D638 Anemia in other chronic diseases classified elsewhere: Secondary | ICD-10-CM

## 2021-10-26 MED ORDER — ALBUTEROL SULFATE HFA 108 (90 BASE) MCG/ACT IN AERS: INHALATION_SPRAY | RESPIRATORY_TRACT | 1 refills | Status: DC

## 2021-10-26 MED ORDER — PREDNISONE 20 MG PO TABS: 20.0000 mg | ORAL_TABLET | Freq: Two times a day (BID) | ORAL | 0 refills | Status: DC

## 2021-10-26 NOTE — Progress Notes (Signed)
..Virtual Visit via Telephone Note  I connected with Claire [name] on 11/01/21 at 10:30 AM EST by telephone and verified that I am speaking with the correct person using two identifiers.  Location: Patient: home Provider: clinic  .[name] KitchenParticipating in visit:  Patient: Claire [name] Provider: Tandy Gaw PA-C Provider in training: Claire Muir PA-S    I discussed the limitations, risks, security and privacy concerns of performing an evaluation and management service by telephone and the availability of in person appointments. I also discussed with the patient that there may be a patient responsible charge related to this service. The patient expressed understanding and agreed to proceed.   History of Present Illness: Patient is a 70 year old female with COPD, anxiety, anxiety about health, anemia who calls into the clinic to discuss worsening cough and shortness of breath.  Patient does feel like her cough has become more productive and her shortness of breath has worsened some.  She pretty much uses oxygen all throughout the day.  She is using her albuterol inhaler 1 to 2 puffs multiple times a day.  She denies any fever, chills, sinus pressure, sore throat, ear pain.  She is concerned about her upcoming hematology appointment for anemia.  She wonders what they are going to do and say.  She does request more labs to follow-up on her previous labs.   .. Active Ambulatory Problems    Diagnosis Date Noted   PTSD (post-traumatic stress disorder) 11/10/2011   Neurosis, anxiety, panic type 11/10/2011   Acquired hypothyroidism 11/24/2014   BP (high blood pressure) 07/24/2012   Chronic use of benzodiazepine for therapeutic purpose 08/11/2015   Hyponatremia 12/19/2016   Hyperkalemia 12/19/2016   Centrilobular emphysema (HCC) 12/20/2016   Stage 3a chronic kidney disease (HCC) 01/16/2017   Iron deficiency anemia 01/16/2017   Anemia of chronic disease 05/11/2017   Low serum vitamin B12  05/14/2017   Vitamin D deficiency 05/14/2017   Depressed mood 05/14/2017   High serum parathyroid hormone (PTH) 07/19/2017   Normocytic anemia 07/19/2017   DOE (dyspnea on exertion) 05/09/2018   Recurrent UTI 05/07/2019   Leg cramps 05/29/2019   No energy 05/29/2019   History of hypokalemia 10/09/2019   Vaccine counseling 12/24/2019   Menopausal vaginal dryness 12/24/2019   Chronic rhinitis 01/31/2020   Chronic respiratory failure with hypoxia (HCC) 01/31/2020   Unintended weight loss 02/19/2020   Macrocytic anemia 03/10/2020   Elevated ferritin 03/10/2020   Irritable bowel syndrome with diarrhea 03/10/2020   Anxiety about health 03/23/2020   Pernicious anemia 03/23/2020   Malnutrition of mild degree (HCC) 05/06/2020   Body mass index (BMI) of 19 or less in adult 05/08/2020   Constipation 07/13/2020   Lower extremity edema 07/29/2020   Clostridium difficile colitis 09/11/2020   Mild protein-calorie malnutrition (HCC) 01/19/2021   Underweight 01/22/2021   History of Clostridioides difficile colitis 02/22/2021   Medication management 03/15/2021   Cloudy urine 04/23/2021   Stage 3b chronic kidney disease (HCC) 04/23/2021   Acute cystitis with hematuria 04/26/2021   History of UTI 06/02/2021   Nasal congestion 08/06/2021   Stress due to family tension 09/02/2021   Resolved Ambulatory Problems    Diagnosis Date Noted   Hypothyroid    Acute renal failure (HCC) 12/19/2016   Decreased hemoglobin 12/20/2016   Acute kidney injury (HCC) 03/17/2017   Sinobronchitis 06/16/2020   Past Medical History:  Diagnosis Date   Acid reflux    Anxiety    High blood pressure  Observations/Objective: No acute distress Dry to productive cough Some shortness of breath while talking  .[name] Active Ambulatory Problems    Diagnosis Date Noted   PTSD (post-traumatic stress disorder) 11/10/2011   Neurosis, anxiety, panic type 11/10/2011   Acquired hypothyroidism 11/24/2014   BP (high  blood pressure) 07/24/2012   Chronic use of benzodiazepine for therapeutic purpose 08/11/2015   Hyponatremia 12/19/2016   Hyperkalemia 12/19/2016   Centrilobular emphysema ([place]) 12/20/2016   Stage 3a chronic kidney disease ([place]) 01/16/2017   Iron deficiency anemia 01/16/2017   Anemia of chronic disease 05/11/2017   Low serum vitamin B12 05/14/2017   Vitamin D deficiency 05/14/2017   Depressed mood 05/14/2017   High serum parathyroid hormone (PTH) 07/19/2017   Normocytic anemia 07/19/2017   DOE (dyspnea on exertion) 05/09/2018   Recurrent UTI 05/07/2019   Leg cramps 05/29/2019   No energy 05/29/2019   History of hypokalemia 10/09/2019   Vaccine counseling 12/24/2019   Menopausal vaginal dryness 12/24/2019   Chronic rhinitis 01/31/2020   Chronic respiratory failure with hypoxia ([place]) 01/31/2020   Unintended weight loss 02/19/2020   Macrocytic anemia 03/10/2020   Elevated ferritin 03/10/2020   Irritable bowel syndrome with diarrhea 03/10/2020   Anxiety about health 03/23/2020   Pernicious anemia 03/23/2020   Malnutrition of mild degree ([place]) 05/06/2020   Body mass index (BMI) of 19 or less in adult 05/08/2020   Constipation 07/13/2020   Lower extremity edema 07/29/2020   Clostridium difficile colitis 09/11/2020   Mild protein-calorie malnutrition ([place]) 01/19/2021   Underweight 01/22/2021   History of Clostridioides difficile colitis 02/22/2021   Medication management 03/15/2021   Cloudy urine 04/23/2021   Stage 3b chronic kidney disease ([place]) 04/23/2021   Acute cystitis with hematuria 04/26/2021   History of UTI 06/02/2021   Nasal congestion 08/06/2021   Stress due to family tension 09/02/2021   Resolved Ambulatory Problems    Diagnosis Date Noted   Hypothyroid    Acute renal failure ([place]) 12/19/2016   Decreased hemoglobin 12/20/2016   Acute kidney injury ([place]) 03/17/2017   Sinobronchitis 06/16/2020   Past Medical History:  Diagnosis Date   Acid reflux    Anxiety     High blood pressure       Assessment and Plan: [name] KitchenMarland KitchenBrayley was seen today for copd and anxiety about health.  Diagnoses and all orders for this visit:  COPD exacerbation ([place]) -     predniSONE (DELTASONE) 20 MG tablet; Take 1 tablet (20 mg total) by mouth 2 (two) times daily with a meal. -     albuterol (PROAIR HFA) 108 (90 Base) MCG/ACT inhaler; INHALE 1-2 PUFFS INTO THE LUNGS EVERY 2 HOURS AS NEEDED FOR WHEEZING OR SHORTNESS OF BREATH.  Anemia of chronic disease -     TSH -     COMPLETE METABOLIC PANEL WITH GFR -     CBC w/Diff/Platelet  Centrilobular emphysema (HCC) -     albuterol (PROAIR HFA) 108 (90 Base) MCG/ACT inhaler; INHALE 1-2 PUFFS INTO THE LUNGS EVERY 2 HOURS AS NEEDED FOR WHEEZING OR SHORTNESS OF BREATH.  Acquired hypothyroidism -     TSH  Hyponatremia -     COMPLETE METABOLIC PANEL WITH GFR  Hyperkalemia -     COMPLETE METABOLIC PANEL WITH GFR  Needs recheck on labs.  Has appt with hematology to discuss anemia of chronic disease.  Treated for COPD exacerbation with prednisone and albuterol.  Continue with dulera and spiriva.    Follow Up Instructions:  I discussed the assessment and treatment plan with the patient. The patient was provided an opportunity to ask questions and all were answered. The patient agreed with the plan and demonstrated an understanding of the instructions.   The patient was advised to call back or seek an in-person evaluation if the symptoms worsen or if the condition fails to improve as anticipated.  I provided 20 minutes of non-face-to-face time during this encounter.   Iran Planas, PA-C

## 2021-10-27 ENCOUNTER — Telehealth: Payer: Medicare Other | Admitting: Physician Assistant

## 2021-10-27 ENCOUNTER — Telehealth: Payer: Self-pay | Admitting: Physician Assistant

## 2021-10-27 NOTE — Telephone Encounter (Signed)
Pt notified of provider recommendations and stated that she will just go through a pharmacy drive-thru and get a test.

## 2021-10-27 NOTE — Telephone Encounter (Signed)
Our covid test take 24 hours to result. You need to take a home test.

## 2021-10-27 NOTE — Telephone Encounter (Signed)
Pt had a Virtual appt with Claire [name] yesterday 10/26/21 and her Hematologist is requiring her to have a covid test before he will see her. Please advise patient

## 2021-10-29 ENCOUNTER — Other Ambulatory Visit: Payer: Self-pay | Admitting: Physician Assistant

## 2021-10-29 DIAGNOSIS — E039 Hypothyroidism, unspecified: Secondary | ICD-10-CM

## 2021-10-29 DIAGNOSIS — F418 Other specified anxiety disorders: Secondary | ICD-10-CM

## 2021-11-02 ENCOUNTER — Ambulatory Visit (INDEPENDENT_AMBULATORY_CARE_PROVIDER_SITE_OTHER): Payer: Medicare Other | Admitting: Physician Assistant

## 2021-11-02 ENCOUNTER — Other Ambulatory Visit: Payer: Self-pay

## 2021-11-02 ENCOUNTER — Encounter: Payer: Self-pay | Admitting: Physician Assistant

## 2021-11-02 VITALS — BP 143/73 | HR 82 | Temp 98.3°F | Ht 63.0 in | Wt 96.0 lb

## 2021-11-02 DIAGNOSIS — Z79899 Other long term (current) drug therapy: Secondary | ICD-10-CM

## 2021-11-02 DIAGNOSIS — E039 Hypothyroidism, unspecified: Secondary | ICD-10-CM | POA: Diagnosis not present

## 2021-11-02 DIAGNOSIS — J432 Centrilobular emphysema: Secondary | ICD-10-CM

## 2021-11-02 DIAGNOSIS — E875 Hyperkalemia: Secondary | ICD-10-CM | POA: Diagnosis not present

## 2021-11-02 DIAGNOSIS — J398 Other specified diseases of upper respiratory tract: Secondary | ICD-10-CM | POA: Diagnosis not present

## 2021-11-02 DIAGNOSIS — D638 Anemia in other chronic diseases classified elsewhere: Secondary | ICD-10-CM | POA: Diagnosis not present

## 2021-11-02 DIAGNOSIS — Z1152 Encounter for screening for COVID-19: Secondary | ICD-10-CM | POA: Diagnosis not present

## 2021-11-02 DIAGNOSIS — E871 Hypo-osmolality and hyponatremia: Secondary | ICD-10-CM | POA: Diagnosis not present

## 2021-11-02 DIAGNOSIS — F17200 Nicotine dependence, unspecified, uncomplicated: Secondary | ICD-10-CM | POA: Diagnosis not present

## 2021-11-03 ENCOUNTER — Other Ambulatory Visit: Payer: Self-pay | Admitting: Physician Assistant

## 2021-11-03 DIAGNOSIS — F17200 Nicotine dependence, unspecified, uncomplicated: Secondary | ICD-10-CM | POA: Insufficient documentation

## 2021-11-03 DIAGNOSIS — J449 Chronic obstructive pulmonary disease, unspecified: Secondary | ICD-10-CM | POA: Diagnosis not present

## 2021-11-03 DIAGNOSIS — F418 Other specified anxiety disorders: Secondary | ICD-10-CM

## 2021-11-03 DIAGNOSIS — E039 Hypothyroidism, unspecified: Secondary | ICD-10-CM

## 2021-11-03 LAB — COMPLETE METABOLIC PANEL WITH GFR
AG Ratio: 1 (calc) (ref 1.0–2.5)
ALT: 17 U/L (ref 6–29)
AST: 13 U/L (ref 10–35)
Albumin: 3.7 g/dL (ref 3.6–5.1)
Alkaline phosphatase (APISO): 70 U/L (ref 37–153)
BUN: 19 mg/dL (ref 7–25)
CO2: 36 mmol/L — ABNORMAL HIGH (ref 20–32)
Calcium: 9.8 mg/dL (ref 8.6–10.4)
Chloride: 91 mmol/L — ABNORMAL LOW (ref 98–110)
Creat: 0.88 mg/dL (ref 0.60–1.00)
Globulin: 3.7 g/dL (calc) (ref 1.9–3.7)
Glucose, Bld: 89 mg/dL (ref 65–99)
Potassium: 3.4 mmol/L — ABNORMAL LOW (ref 3.5–5.3)
Sodium: 137 mmol/L (ref 135–146)
Total Bilirubin: 0.3 mg/dL (ref 0.2–1.2)
Total Protein: 7.4 g/dL (ref 6.1–8.1)
eGFR: 71 mL/min/{1.73_m2} (ref >=60)

## 2021-11-03 LAB — NOVEL CORONAVIRUS, NAA: SARS-CoV-2, NAA: NOT DETECTED

## 2021-11-03 LAB — CBC WITH DIFFERENTIAL/PLATELET
Absolute Monocytes: 1079 cells/uL — ABNORMAL HIGH (ref 200–950)
Basophils Absolute: 0 cells/uL (ref 0–200)
Basophils Relative: 0 %
Eosinophils Absolute: 9 cells/uL — ABNORMAL LOW (ref 15–500)
Eosinophils Relative: 0.1 %
HCT: 28.4 % — ABNORMAL LOW (ref 35.0–45.0)
Hemoglobin: 9.5 g/dL — ABNORMAL LOW (ref 11.7–15.5)
Lymphs Abs: 1432 cells/uL (ref 850–3900)
MCH: 31.6 pg (ref 27.0–33.0)
MCHC: 33.5 g/dL (ref 32.0–36.0)
MCV: 94.4 fL (ref 80.0–100.0)
MPV: 8.8 fL (ref 7.5–12.5)
Monocytes Relative: 11.6 %
Neutro Abs: 6780 cells/uL (ref 1500–7800)
Neutrophils Relative %: 72.9 %
Platelets: 469 10*3/uL — ABNORMAL HIGH (ref 140–400)
RBC: 3.01 10*6/uL — ABNORMAL LOW (ref 3.80–5.10)
RDW: 12.4 % (ref 11.0–15.0)
Total Lymphocyte: 15.4 %
WBC: 9.3 10*3/uL (ref 3.8–10.8)

## 2021-11-03 LAB — SARS-COV-2, NAA 2 DAY TAT

## 2021-11-03 LAB — TSH: TSH: 1.66 mIU/L (ref 0.40–4.50)

## 2021-11-03 MED ORDER — LEVOTHYROXINE SODIUM 75 MCG PO TABS: 75.0000 ug | ORAL_TABLET | Freq: Every day | ORAL | 3 refills | Status: AC

## 2021-11-03 NOTE — Progress Notes (Signed)
Subjective:    Patient ID: Claire [name], female    DOB: 1951/11/13, 70 y.o.   MRN: [id number]  HPI Patient is a 70 year old underweight female with COPD, hypothyroidism, anemia of chronic disease, CKD 3 who presents to the clinic for follow-up and COVID testing.  Patient does have an upcoming appointment at hematology for her anemia of chronic disease and needs a negative COVID test.  She does have ongoing shortness of breath and productive cough.  She just finished a course of prednisone for COPD exacerbation.  She takes [place] and Spiriva daily.  She is using her albuterol inhaler regularly.  Often her pharmacy does not want to refill this because she is using it too much.  She does not want to switch her preventative inhalers.  She is on 2 L of continuous oxygen.  Overall she does feel stable today.  She is cutting back on smoking.  She is down to 2 or 3 cigarettes a day.  Thyroid medication recently changed manufactors and needs labs.   .. Active Ambulatory Problems    Diagnosis Date Noted   PTSD (post-traumatic stress disorder) 11/10/2011   Neurosis, anxiety, panic type 11/10/2011   Acquired hypothyroidism 11/24/2014   BP (high blood pressure) 07/24/2012   Chronic use of benzodiazepine for therapeutic purpose 08/11/2015   Hyponatremia 12/19/2016   Hyperkalemia 12/19/2016   Centrilobular emphysema ([place]) 12/20/2016   Stage 3a chronic kidney disease ([place]) 01/16/2017   Iron deficiency anemia 01/16/2017   Anemia of chronic disease 05/11/2017   Low serum vitamin B12 05/14/2017   Vitamin D deficiency 05/14/2017   Depressed mood 05/14/2017   High serum parathyroid hormone (PTH) 07/19/2017   Normocytic anemia 07/19/2017   DOE (dyspnea on exertion) 05/09/2018   Recurrent UTI 05/07/2019   Leg cramps 05/29/2019   No energy 05/29/2019   History of hypokalemia 10/09/2019   Vaccine counseling 12/24/2019   Menopausal vaginal dryness 12/24/2019   Chronic rhinitis 01/31/2020   Chronic  respiratory failure with hypoxia ([place]) 01/31/2020   Unintended weight loss 02/19/2020   Macrocytic anemia 03/10/2020   Elevated ferritin 03/10/2020   Irritable bowel syndrome with diarrhea 03/10/2020   Anxiety about health 03/23/2020   Pernicious anemia 03/23/2020   Malnutrition of mild degree ([place]) 05/06/2020   Body mass index (BMI) of 19 or less in adult 05/08/2020   Constipation 07/13/2020   Lower extremity edema 07/29/2020   Clostridium difficile colitis 09/11/2020   Mild protein-calorie malnutrition ([place]) 01/19/2021   Underweight 01/22/2021   History of Clostridioides difficile colitis 02/22/2021   Medication management 03/15/2021   Cloudy urine 04/23/2021   Stage 3b chronic kidney disease ([place]) 04/23/2021   Acute cystitis with hematuria 04/26/2021   History of UTI 06/02/2021   Nasal congestion 08/06/2021   Stress due to family tension 09/02/2021   Current smoker 11/03/2021   Resolved Ambulatory Problems    Diagnosis Date Noted   Hypothyroid    Acute renal failure ([place]) 12/19/2016   Decreased hemoglobin 12/20/2016   Acute kidney injury ([place]) 03/17/2017   Sinobronchitis 06/16/2020   Past Medical History:  Diagnosis Date   Acid reflux    Anxiety    High blood pressure      Review of Systems  All other systems reviewed and are negative.     Objective:   Physical Exam Vitals reviewed.  Constitutional:      Comments: frail  HENT:     Head: Normocephalic.  Cardiovascular:     Rate and Rhythm:  Normal rate and regular rhythm.     Pulses: Normal pulses.  Pulmonary:     Effort: Pulmonary effort is normal.     Comments: Diminished breath sounds On 2L of O2 Musculoskeletal:     Right lower leg: No edema.     Left lower leg: No edema.  Neurological:     General: No focal deficit present.     Mental Status: She is alert and oriented to person, place, and time.  Psychiatric:        Mood and Affect: Mood normal.          Assessment & Plan:  [name] KitchenMarland KitchenJelisha was  seen today for follow-up.  Diagnoses and all orders for this visit:  Encounter for screening for COVID-19 -     Novel Coronavirus, NAA ([facility])  Medication management  Current smoker  Centrilobular emphysema (HCC)  Pt has appt with hematology and needs covid testing before she goes.  PCR pending.   She already has labs pended for follow up on anemia/thyroid. Will refill medications as needed.   COPD-stable on 2L of continuous O2. Pt is down to 2-3 cigarettes a day. Discussed cravings and how to continue to decrease. Continue on dulera/spiriva.

## 2021-11-03 NOTE — Progress Notes (Signed)
Negative for covid

## 2021-11-03 NOTE — Progress Notes (Signed)
Claire [name],   Thyroid looks perfect. Sent refills.   GFR looks amazing. It has gotten even better!   Your potassium is a little low. Are you doing anything different with diet? You do need to increase potassium rich foods!  Your RBC and hemoglobin have both improved a lot!   Labs look good.

## 2021-11-05 ENCOUNTER — Telehealth: Payer: Self-pay | Admitting: Physician Assistant

## 2021-11-05 NOTE — Telephone Encounter (Signed)
Pt informed.  Pt expressed understanding and is agreeable.  T. Lillyanne Bradburn, CMA  

## 2021-11-05 NOTE — Telephone Encounter (Signed)
Pt called. She believes Atenolol is causing her heart to race.  She doesn't think she is on enough of it.  She is on 25 mg and she wants to know if she can increase to 50 mg.

## 2021-11-05 NOTE — Telephone Encounter (Signed)
HR was plenty elevated. Ok to increase to 50mg . BP recheck in 2 weeks.

## 2021-11-08 ENCOUNTER — Other Ambulatory Visit: Payer: Self-pay | Admitting: Physician Assistant

## 2021-11-08 DIAGNOSIS — E538 Deficiency of other specified B group vitamins: Secondary | ICD-10-CM | POA: Diagnosis not present

## 2021-11-08 DIAGNOSIS — J441 Chronic obstructive pulmonary disease with (acute) exacerbation: Secondary | ICD-10-CM

## 2021-11-08 DIAGNOSIS — D539 Nutritional anemia, unspecified: Secondary | ICD-10-CM | POA: Diagnosis not present

## 2021-11-08 DIAGNOSIS — D508 Other iron deficiency anemias: Secondary | ICD-10-CM | POA: Diagnosis not present

## 2021-11-08 DIAGNOSIS — J432 Centrilobular emphysema: Secondary | ICD-10-CM

## 2021-11-08 DIAGNOSIS — F17210 Nicotine dependence, cigarettes, uncomplicated: Secondary | ICD-10-CM | POA: Diagnosis not present

## 2021-11-09 ENCOUNTER — Telehealth (INDEPENDENT_AMBULATORY_CARE_PROVIDER_SITE_OTHER): Payer: Medicare Other | Admitting: Physician Assistant

## 2021-11-09 ENCOUNTER — Ambulatory Visit (HOSPITAL_COMMUNITY): Payer: Medicare Other | Admitting: Licensed Clinical Social Worker

## 2021-11-09 DIAGNOSIS — D509 Iron deficiency anemia, unspecified: Secondary | ICD-10-CM

## 2021-11-09 NOTE — Progress Notes (Signed)
..Virtual Visit via Telephone Note  I connected with Claire [name] on 11/09/21 at  4:00 PM EST by telephone and verified that I am speaking with the correct person using two identifiers.  Location: Patient: home Provider: clinic  .Participating in visit:  Patient: Claire [name] Provider: Iran Planas PA-C   I discussed the limitations, risks, security and privacy concerns of performing an evaluation and management service by telephone and the availability of in person appointments. I also discussed with the patient that there may be a patient responsible charge related to this service. The patient expressed understanding and agreed to proceed.   History of Present Illness:  Pt would like to follow up after seeing hematologist.   See NOTES below.  Macrocytic anemia: I had a long discussion with Claire [name]. I reviewed how she presented and was diagnosed with macrocytic anemia. We discussed the fact that this could be related to iron deficiency. We also discussed the fact that this could be nutritional deficiency anemia. We discussed the possibility of auto immune hemolytic anemia. We discussed that the possibility of anemia to chronic inflammation. We discussed the possibility of anemia of chronic disease. We also discussed the possibility of a bone marrow disorder. We discussed the possibility of MDS and the possibility of multiple myeloma.  - I will check a CBC and CMP - I will check an iron profile, folic acid and vitamin B12 level. - I will check a reticulocyte count, haptoglobin, and LDH - I will check an SPEP, FLC and iron effects.  History of iron deficiency: She was last noted to be iron deficient April 2022. On her own she started taking an iron supplement a few weeks ago. She reports that she has had trouble with constipation.  - I am checking an updated iron profile today. She reports that she has not taken an iron supplement in 2 days. - We discussed the fact that I recommend using  iron infusions since she has not tolerated iron pills well. She told me that she prefers a trial of oral iron supplementation with a bowel regiment before being considered for IV iron. This is a reasonable request.  Claire [name] was given an opportunity to ask questions. All of her questions were answered to her satisfaction.   FOLLOW UP: Labs + visit in 3 mos  Disclaimer: This note was dictated with voice recognition software. Similar sounding words can inadvertently be transcribed and may not be corrected upon review  Orders Placed This Encounter  Procedures   CBC And Differential   Comprehensive Metabolic Panel   Fe+TIBC+Fer   Lactate Dehydrogenase   Haptoglobin   Reticulocyte Count Automated   Vitamin B12 and Folate   IFE, PE and FLC, Serum   CBC And Differential   Fe+TIBC+Fer   Claire Belling, MD 11/08/2021 / 5:32 PM   Electronically signed by Claire Belling, MD at 11/08/2021 5:35 PM EST   .[name] Active Ambulatory Problems    Diagnosis Date Noted   PTSD (post-traumatic stress disorder) 11/10/2011   Neurosis, anxiety, panic type 11/10/2011   Acquired hypothyroidism 11/24/2014   BP (high blood pressure) 07/24/2012   Chronic use of benzodiazepine for therapeutic purpose 08/11/2015   Hyponatremia 12/19/2016   Hyperkalemia 12/19/2016   Centrilobular emphysema ([place]) 12/20/2016   Stage 3a chronic kidney disease ([place]) 01/16/2017   Iron deficiency anemia 01/16/2017   Anemia of chronic disease 05/11/2017   Low serum vitamin B12 05/14/2017   Vitamin D deficiency 05/14/2017   Depressed  mood 05/14/2017   High serum parathyroid hormone (PTH) 07/19/2017   Normocytic anemia 07/19/2017   DOE (dyspnea on exertion) 05/09/2018   Recurrent UTI 05/07/2019   Leg cramps 05/29/2019   No energy 05/29/2019   History of hypokalemia 10/09/2019   Vaccine counseling 12/24/2019   Menopausal vaginal dryness 12/24/2019   Chronic rhinitis 01/31/2020   Chronic respiratory failure with hypoxia  ([place]) 01/31/2020   Unintended weight loss 02/19/2020   Macrocytic anemia 03/10/2020   Elevated ferritin 03/10/2020   Irritable bowel syndrome with diarrhea 03/10/2020   Anxiety about health 03/23/2020   Pernicious anemia 03/23/2020   Malnutrition of mild degree ([place]) 05/06/2020   Body mass index (BMI) of 19 or less in adult 05/08/2020   Constipation 07/13/2020   Lower extremity edema 07/29/2020   Clostridium difficile colitis 09/11/2020   Mild protein-calorie malnutrition ([place]) 01/19/2021   Underweight 01/22/2021   History of Clostridioides difficile colitis 02/22/2021   Medication management 03/15/2021   Cloudy urine 04/23/2021   Stage 3b chronic kidney disease ([place]) 04/23/2021   Acute cystitis with hematuria 04/26/2021   History of UTI 06/02/2021   Nasal congestion 08/06/2021   Stress due to family tension 09/02/2021   Current smoker 11/03/2021   Resolved Ambulatory Problems    Diagnosis Date Noted   Hypothyroid    Acute renal failure ([place]) 12/19/2016   Decreased hemoglobin 12/20/2016   Acute kidney injury ([place]) 03/17/2017   Sinobronchitis 06/16/2020   Past Medical History:  Diagnosis Date   Acid reflux    Anxiety    High blood pressure     Observations/Objective: No acute distress  Normal breathing Occasional productive cough    Assessment and Plan: [name] KitchenMarland KitchenKhaliyah was seen today for advice only.  Diagnoses and all orders for this visit:  Iron deficiency anemia, unspecified iron deficiency anemia type  Encouraged her to follow up with hematology after she has done the iron TID with miralax and B12.  Reassured patient that plan is appropriate.    Follow Up Instructions:    I discussed the assessment and treatment plan with the patient. The patient was provided an opportunity to ask questions and all were answered. The patient agreed with the plan and demonstrated an understanding of the instructions.   The patient was advised to call back or seek an in-person  evaluation if the symptoms worsen or if the condition fails to improve as anticipated.  I provided 20 minutes of non-face-to-face time during this encounter.   Iran Planas, PA-C

## 2021-11-11 ENCOUNTER — Telehealth: Payer: Self-pay | Admitting: Physician Assistant

## 2021-11-11 ENCOUNTER — Other Ambulatory Visit: Payer: Self-pay | Admitting: Physician Assistant

## 2021-11-11 DIAGNOSIS — J310 Chronic rhinitis: Secondary | ICD-10-CM

## 2021-11-11 NOTE — Telephone Encounter (Signed)
Patient is requesting her Atrovent Nasal Spray to be called in for her as soon as possible

## 2021-11-11 NOTE — Telephone Encounter (Signed)
Received request from pharmacy and sent.

## 2021-11-12 ENCOUNTER — Other Ambulatory Visit: Payer: Self-pay | Admitting: Physician Assistant

## 2021-11-12 DIAGNOSIS — N390 Urinary tract infection, site not specified: Secondary | ICD-10-CM

## 2021-11-16 ENCOUNTER — Other Ambulatory Visit (HOSPITAL_COMMUNITY): Payer: Self-pay | Admitting: Physician Assistant

## 2021-11-16 DIAGNOSIS — I10 Essential (primary) hypertension: Secondary | ICD-10-CM

## 2021-11-18 ENCOUNTER — Other Ambulatory Visit: Payer: Self-pay | Admitting: Physician Assistant

## 2021-11-18 ENCOUNTER — Other Ambulatory Visit (HOSPITAL_COMMUNITY): Payer: Self-pay | Admitting: Physician Assistant

## 2021-11-18 DIAGNOSIS — I10 Essential (primary) hypertension: Secondary | ICD-10-CM

## 2021-11-18 NOTE — Telephone Encounter (Signed)
I can't find notes where this was increased, please review and advise.

## 2021-11-19 ENCOUNTER — Other Ambulatory Visit: Payer: Self-pay | Admitting: Neurology

## 2021-11-19 ENCOUNTER — Ambulatory Visit (INDEPENDENT_AMBULATORY_CARE_PROVIDER_SITE_OTHER): Payer: Medicare Other | Admitting: Family Medicine

## 2021-11-19 ENCOUNTER — Encounter: Payer: Self-pay | Admitting: Family Medicine

## 2021-11-19 DIAGNOSIS — J019 Acute sinusitis, unspecified: Secondary | ICD-10-CM | POA: Diagnosis not present

## 2021-11-19 DIAGNOSIS — J441 Chronic obstructive pulmonary disease with (acute) exacerbation: Secondary | ICD-10-CM

## 2021-11-19 DIAGNOSIS — J432 Centrilobular emphysema: Secondary | ICD-10-CM

## 2021-11-19 MED ORDER — ALBUTEROL SULFATE HFA 108 (90 BASE) MCG/ACT IN AERS: INHALATION_SPRAY | RESPIRATORY_TRACT | 1 refills | Status: DC

## 2021-11-19 NOTE — Progress Notes (Signed)
° °  Virtual Visit via Telephone Note  I connected with Claire [name] on 11/19/21 at  9:10 AM EST by telephone and verified that I am speaking with the correct person using two identifiers.   I discussed the limitations, risks, security and privacy concerns of performing an evaluation and management service by telephone and the availability of in person appointments. I also discussed with the patient that there may be a patient responsible charge related to this service. The patient expressed understanding and agreed to proceed.  Patient location: at home Provider loccation: In office   Subjective:    CC:   Chief Complaint  Patient presents with   Head congestion     Stuffy ears some improvement with atrovent nasal spray, 1 week     HPI: 1.5 week of nasal drainage and drip and cough. Started using Atrovent nasal spray and that has help but she is still having a lot of ear pressure. No "pain"  + popping. Still has a lot of throat clearing.  No fever. No headache but some congestion in forehead.  Rubbing Vicks vapor rub on her forehead and that is helping.  No SOB or wheezing that is new.  Occ SOB w/ activity but his is not new.  NO ST or upset stomach. She really wants to stay off antibiotics bc of her history of c diff.  No fevers chills or sweats.   Past medical history, Surgical history, Family history not pertinant except as noted below, Social history, Allergies, and medications have been entered into the medical record, reviewed, and corrections made.   Review of Systems: No fevers, chills, night sweats, weight loss, chest pain, or shortness of breath.   Objective:    General: Speaking clearly in complete sentences without any shortness of breath.  Alert and oriented x3.  Normal judgment. No apparent acute distress.    Impression and Recommendations:    Problem List Items Addressed This Visit   None Visit Diagnoses     Acute non-recurrent sinusitis, unspecified location     -  Primary       Recommend nasal saline before use of Atrovent. She is using it twice a day.  Ok to add in midday dose or add in Flonase midday to help with some of the head pressure and nasal pressure.. Review best nasal spray technique since has had problems in the past.  If not improving by Monday then please let us know.  Okay to continue with symptomatic care.  Lasts of rest and hydration.  No red flag symptoms.  She is not febrile.  No orders of the defined types were placed in this encounter.   No orders of the defined types were placed in this encounter.    I discussed the assessment and treatment plan with the patient. The patient was provided an opportunity to ask questions and all were answered. The patient agreed with the plan and demonstrated an understanding of the instructions.   The patient was advised to call back or seek an in-person evaluation if the symptoms worsen or if the condition fails to improve as anticipated.  I provided 21 minutes of non-face-to-face time during this encounter.   Nani Gasser, MD

## 2021-11-19 NOTE — Telephone Encounter (Signed)
She states taking twice daily, please advise.

## 2021-11-22 DIAGNOSIS — R0902 Hypoxemia: Secondary | ICD-10-CM | POA: Diagnosis not present

## 2021-11-24 ENCOUNTER — Telehealth (INDEPENDENT_AMBULATORY_CARE_PROVIDER_SITE_OTHER): Payer: Medicare Other | Admitting: Physician Assistant

## 2021-11-24 VITALS — Ht 63.0 in | Wt 96.0 lb

## 2021-11-24 DIAGNOSIS — J310 Chronic rhinitis: Secondary | ICD-10-CM | POA: Diagnosis not present

## 2021-11-24 DIAGNOSIS — J0140 Acute pansinusitis, unspecified: Secondary | ICD-10-CM | POA: Diagnosis not present

## 2021-11-24 NOTE — Progress Notes (Signed)
..Virtual Visit via Telephone Note  I connected with Claire [name] on 11/24/21 at  3:00 PM EST by telephone and verified that I am speaking with the correct person using two identifiers.  Location: Patient: home Provider: clinic  .[name] KitchenParticipating in visit:  Patient: Claire [name] Provider: Iran Planas PA-C   I discussed the limitations, risks, security and privacy concerns of performing an evaluation and management service by telephone and the availability of in person appointments. I also discussed with the patient that there may be a patient responsible charge related to this service. The patient expressed understanding and agreed to proceed.   History of Present Illness: Pt is a 70 yo female with severe COPD, chronic rhinitis, sinusitis who is here to follow up after visit with Dr. Madilyn Fireman on 12/16. She was treated with atrovent and flonase but not real improvement. She is still very congested and has a lot of sinus pressure. Hx of c.diff and does not want to start antibiotic. No fever, chills, body aches. No worsening of SOB.   [name]. Active Ambulatory Problems    Diagnosis Date Noted   PTSD (post-traumatic stress disorder) 11/10/2011   Neurosis, anxiety, panic type 11/10/2011   Acquired hypothyroidism 11/24/2014   BP (high blood pressure) 07/24/2012   Chronic use of benzodiazepine for therapeutic purpose 08/11/2015   Hyponatremia 12/19/2016   Hyperkalemia 12/19/2016   Centrilobular emphysema ([place]) 12/20/2016   Stage 3a chronic kidney disease ([place]) 01/16/2017   Iron deficiency anemia 01/16/2017   Anemia of chronic disease 05/11/2017   Low serum vitamin B12 05/14/2017   Vitamin D deficiency 05/14/2017   Depressed mood 05/14/2017   High serum parathyroid hormone (PTH) 07/19/2017   Normocytic anemia 07/19/2017   DOE (dyspnea on exertion) 05/09/2018   Recurrent UTI 05/07/2019   Leg cramps 05/29/2019   No energy 05/29/2019   History of hypokalemia 10/09/2019   Vaccine counseling  12/24/2019   Menopausal vaginal dryness 12/24/2019   Chronic rhinitis 01/31/2020   Chronic respiratory failure with hypoxia ([place]) 01/31/2020   Unintended weight loss 02/19/2020   Macrocytic anemia 03/10/2020   Elevated ferritin 03/10/2020   Irritable bowel syndrome with diarrhea 03/10/2020   Anxiety about health 03/23/2020   Pernicious anemia 03/23/2020   Malnutrition of mild degree ([place]) 05/06/2020   Body mass index (BMI) of 19 or less in adult 05/08/2020   Constipation 07/13/2020   Lower extremity edema 07/29/2020   Clostridium difficile colitis 09/11/2020   Mild protein-calorie malnutrition ([place]) 01/19/2021   Underweight 01/22/2021   History of Clostridioides difficile colitis 02/22/2021   Medication management 03/15/2021   Cloudy urine 04/23/2021   Stage 3b chronic kidney disease ([place]) 04/23/2021   Acute cystitis with hematuria 04/26/2021   History of UTI 06/02/2021   Nasal congestion 08/06/2021   Stress due to family tension 09/02/2021   Current smoker 11/03/2021   Resolved Ambulatory Problems    Diagnosis Date Noted   Hypothyroid    Acute renal failure ([place]) 12/19/2016   Decreased hemoglobin 12/20/2016   Acute kidney injury ([place]) 03/17/2017   Sinobronchitis 06/16/2020   Past Medical History:  Diagnosis Date   Acid reflux    Anxiety    High blood pressure        Observations/Objective: No acute distress Normal breathing no wheezing Productive cough on phone  .[name] Today's Vitals   11/24/21 1143  Weight: 96 lb (43.5 kg)  Height: 5\' 3"  (1.6 m)   Body mass index is 17.01 kg/m.    Assessment and Plan: [name] KitchenMarland Kitchen  Jordis was seen today for follow-up.  Diagnoses and all orders for this visit:  Subacute pansinusitis  Chronic rhinitis   No alarming signs of features.  She has a medrol dose pack at home Start that and see if most of sinusitis is inflammation If not improving then we can consider abx.  Continue using atrovent and flonase   Follow Up  Instructions:    I discussed the assessment and treatment plan with the patient. The patient was provided an opportunity to ask questions and all were answered. The patient agreed with the plan and demonstrated an understanding of the instructions.   The patient was advised to call back or seek an in-person evaluation if the symptoms worsen or if the condition fails to improve as anticipated.  I provided 15 minutes of non-face-to-face time during this encounter.   Tandy Gaw, PA-C

## 2021-11-26 ENCOUNTER — Encounter: Payer: Self-pay | Admitting: Physician Assistant

## 2021-12-03 ENCOUNTER — Encounter: Payer: Self-pay | Admitting: Physician Assistant

## 2021-12-03 ENCOUNTER — Telehealth (INDEPENDENT_AMBULATORY_CARE_PROVIDER_SITE_OTHER): Payer: Medicare Other | Admitting: Physician Assistant

## 2021-12-03 DIAGNOSIS — J0140 Acute pansinusitis, unspecified: Secondary | ICD-10-CM | POA: Diagnosis not present

## 2021-12-03 DIAGNOSIS — J449 Chronic obstructive pulmonary disease, unspecified: Secondary | ICD-10-CM | POA: Diagnosis not present

## 2021-12-03 MED ORDER — CLARITHROMYCIN 500 MG PO TABS: 500.0000 mg | ORAL_TABLET | Freq: Two times a day (BID) | ORAL | 0 refills | Status: DC

## 2021-12-03 NOTE — Progress Notes (Signed)
..Virtual Visit via Telephone Note  I connected with Claire [name] on 12/03/21 at  3:40 PM EST by telephone and verified that I am speaking with the correct person using two identifiers.  Location: Patient: home Provider: clinic  .[name] KitchenParticipating in visit:  Patient: Claire [name] Provider: Tandy Gaw PA-C   I discussed the limitations, risks, security and privacy concerns of performing an evaluation and management service by telephone and the availability of in person appointments. I also discussed with the patient that there may be a patient responsible charge related to this service. The patient expressed understanding and agreed to proceed.   History of Present Illness: Pt is a 70 yo female with severe COPD, chronic respiratory failure, HTN, CKD 3, hx of C.diff who presents to the clinic with sinus pressure and drainage that will not go away. She took one dose of medrol dose pack and started having visual and auditory hallucinations. She stopped it and they went away. Atrovent seems to be helping her chronic rhinitis quite a bit. She continues to have a lot of sinus pressure and drainge. She is ready for antibiotic.  .. Active Ambulatory Problems    Diagnosis Date Noted   PTSD (post-traumatic stress disorder) 11/10/2011   Neurosis, anxiety, panic type 11/10/2011   Acquired hypothyroidism 11/24/2014   BP (high blood pressure) 07/24/2012   Chronic use of benzodiazepine for therapeutic purpose 08/11/2015   Hyponatremia 12/19/2016   Hyperkalemia 12/19/2016   Centrilobular emphysema ([facility]) 12/20/2016   Stage 3a chronic kidney disease ([facility]) 01/16/2017   Iron deficiency anemia 01/16/2017   Anemia of chronic disease 05/11/2017   Low serum vitamin B12 05/14/2017   Vitamin D deficiency 05/14/2017   Depressed mood 05/14/2017   High serum parathyroid hormone (PTH) 07/19/2017   Normocytic anemia 07/19/2017   DOE (dyspnea on exertion) 05/09/2018   Recurrent UTI 05/07/2019   Leg cramps 05/29/2019    No energy 05/29/2019   History of hypokalemia 10/09/2019   Vaccine counseling 12/24/2019   Menopausal vaginal dryness 12/24/2019   Chronic rhinitis 01/31/2020   Chronic respiratory failure with hypoxia ([facility]) 01/31/2020   Unintended weight loss 02/19/2020   Macrocytic anemia 03/10/2020   Elevated ferritin 03/10/2020   Irritable bowel syndrome with diarrhea 03/10/2020   Anxiety about health 03/23/2020   Pernicious anemia 03/23/2020   Malnutrition of mild degree (HCC) 05/06/2020   Body mass index (BMI) of 19 or less in adult 05/08/2020   Constipation 07/13/2020   Lower extremity edema 07/29/2020   Clostridium difficile colitis 09/11/2020   Mild protein-calorie malnutrition (HCC) 01/19/2021   Underweight 01/22/2021   History of Clostridioides difficile colitis 02/22/2021   Medication management 03/15/2021   Cloudy urine 04/23/2021   Stage 3b chronic kidney disease (HCC) 04/23/2021   Acute cystitis with hematuria 04/26/2021   History of UTI 06/02/2021   Nasal congestion 08/06/2021   Stress due to family tension 09/02/2021   Current smoker 11/03/2021   Resolved Ambulatory Problems    Diagnosis Date Noted   Hypothyroid    Acute renal failure (H[facility]) 12/19/2016   Decreased hemoglobin 12/20/2016   Acute kidney injury ([facility]) 03/17/2017   Sinobronchitis 06/16/2020   Past Medical History:  Diagnosis Date   Acid reflux    Anxiety    High blood pressure       Observations/Objective: No acute distress  Normal breathing Normal mood  .[name] KitchenThere were no vitals filed for this visit. There is no height or weight on file to calculate BMI.    Assessment  and Plan: [name] KitchenMarland KitchenLikita was seen today for follow-up.  Diagnoses and all orders for this visit:  Subacute pansinusitis -     clarithromycin (BIAXIN) 500 MG tablet; Take 1 tablet (500 mg total) by mouth 2 (two) times daily. For 7 days.   Added medrol dose to intolerance list for hallucinations Concern for c.diff but has been over a  year since pt has had c diff and received an abx Pt has had subacute sinusitis for months Treated with biaxin since per pt she tolerates and on the low c.diff profile Pt knows some diarrhea and loose stools is to be expected but anything out of ordinary reach back out Continue atrovent and flonsase Follow up as needed.    Follow Up Instructions:    I discussed the assessment and treatment plan with the patient. The patient was provided an opportunity to ask questions and all were answered. The patient agreed with the plan and demonstrated an understanding of the instructions.   The patient was advised to call back or seek an in-person evaluation if the symptoms worsen or if the condition fails to improve as anticipated.  I provided 20 minutes of non-face-to-face time during this encounter.   Iran Planas, PA-C

## 2021-12-06 ENCOUNTER — Other Ambulatory Visit (HOSPITAL_COMMUNITY): Payer: Self-pay | Admitting: Psychiatry

## 2021-12-13 ENCOUNTER — Telehealth (HOSPITAL_COMMUNITY): Payer: Self-pay | Admitting: Psychiatry

## 2021-12-13 NOTE — Telephone Encounter (Signed)
This pt said she cannot come in office and she can't do video visits. Are you okay with her only doing phone calls?

## 2021-12-17 ENCOUNTER — Ambulatory Visit (HOSPITAL_COMMUNITY): Payer: Medicare Other | Admitting: Licensed Clinical Social Worker

## 2021-12-17 ENCOUNTER — Encounter: Payer: Self-pay | Admitting: Physician Assistant

## 2021-12-17 ENCOUNTER — Telehealth (INDEPENDENT_AMBULATORY_CARE_PROVIDER_SITE_OTHER): Payer: Medicare Other | Admitting: Physician Assistant

## 2021-12-17 DIAGNOSIS — Z0100 Encounter for examination of eyes and vision without abnormal findings: Secondary | ICD-10-CM | POA: Diagnosis not present

## 2021-12-17 DIAGNOSIS — Z7185 Encounter for immunization safety counseling: Secondary | ICD-10-CM | POA: Diagnosis not present
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
## 2021-12-20 ENCOUNTER — Other Ambulatory Visit: Payer: Self-pay | Admitting: Physician Assistant

## 2021-12-20 DIAGNOSIS — J310 Chronic rhinitis: Secondary | ICD-10-CM

## 2021-12-22 ENCOUNTER — Encounter: Payer: Self-pay | Admitting: Physician Assistant

## 2021-12-22 ENCOUNTER — Telehealth (INDEPENDENT_AMBULATORY_CARE_PROVIDER_SITE_OTHER): Payer: Medicare Other | Admitting: Physician Assistant

## 2021-12-22 DIAGNOSIS — H539 Unspecified visual disturbance: Secondary | ICD-10-CM | POA: Diagnosis not present

## 2021-12-22 DIAGNOSIS — Z0100 Encounter for examination of eyes and vision without abnormal findings: Secondary | ICD-10-CM

## 2021-12-22 NOTE — Progress Notes (Signed)
..Virtual Visit via Telephone Note  I connected with Claire [name] on 12/22/21 at  1:00 PM EST by telephone and verified that I am speaking with the correct person using two identifiers.  Location: Patient: home Provider: clinic  .[name] KitchenParticipating in visit:  Patient: Claire [name] Provider:Emanuelle Bastos Caleen Essex PA-C   I discussed the limitations, risks, security and privacy concerns of performing an evaluation and management service by telephone and the availability of in person appointments. I also discussed with the patient that there may be a patient responsible charge related to this service. The patient expressed understanding and agreed to proceed.   History of Present Illness: Pt is a 71 yo female who calls into the clinic wanting to discuss eye doctor referral. She is concerned about in office appts during covid.   .. Active Ambulatory Problems    Diagnosis Date Noted   PTSD (post-traumatic stress disorder) 11/10/2011   Neurosis, anxiety, panic type 11/10/2011   Acquired hypothyroidism 11/24/2014   BP (high blood pressure) 07/24/2012   Chronic use of benzodiazepine for therapeutic purpose 08/11/2015   Hyponatremia 12/19/2016   Hyperkalemia 12/19/2016   Centrilobular emphysema ([facility]) 12/20/2016   Stage 3a chronic kidney disease ([facility]) 01/16/2017   Iron deficiency anemia 01/16/2017   Anemia of chronic disease 05/11/2017   Low serum vitamin B12 05/14/2017   Vitamin D deficiency 05/14/2017   Depressed mood 05/14/2017   High serum parathyroid hormone (PTH) 07/19/2017   Normocytic anemia 07/19/2017   DOE (dyspnea on exertion) 05/09/2018   Recurrent UTI 05/07/2019   Leg cramps 05/29/2019   No energy 05/29/2019   History of hypokalemia 10/09/2019   Vaccine counseling 12/24/2019   Menopausal vaginal dryness 12/24/2019   Chronic rhinitis 01/31/2020   Chronic respiratory failure with hypoxia ([facility]) 01/31/2020   Unintended weight loss 02/19/2020   Macrocytic anemia 03/10/2020   Elevated  ferritin 03/10/2020   Irritable bowel syndrome with diarrhea 03/10/2020   Anxiety about health 03/23/2020   Pernicious anemia 03/23/2020   Malnutrition of mild degree ([facility]) 05/06/2020   Body mass index (BMI) of 19 or less in adult 05/08/2020   Constipation 07/13/2020   Lower extremity edema 07/29/2020   Clostridium difficile colitis 09/11/2020   Mild protein-calorie malnutrition ([facility]) 01/19/2021   Underweight 01/22/2021   History of Clostridioides difficile colitis 02/22/2021   Medication management 03/15/2021   Cloudy urine 04/23/2021   Stage 3b chronic kidney disease ([facility]) 04/23/2021   Acute cystitis with hematuria 04/26/2021   History of UTI 06/02/2021   Nasal congestion 08/06/2021   Stress due to family tension 09/02/2021   Current smoker 11/03/2021   Resolved Ambulatory Problems    Diagnosis Date Noted   Hypothyroid    Acute renal failure ([facility]CC) 12/19/2016   Decreased hemoglobin 12/20/2016   Acute kidney injury ([facility]CC) 03/17/2017   Sinobronchitis 06/16/2020   Past Medical History:  Diagnosis Date   Acid reflux    Anxiety    High blood pressure        Observations/Objective: No acute distress Normal mood and appearance   Assessment and Plan: [name] KitchenMarland KitchenLilliann was seen today for eye problem.  Diagnoses and all orders for this visit:  Encounter for vision screening -     Ambulatory referral to Ophthalmology  Changes in vision -     Ambulatory referral to Ophthalmology   Suggested she go downstairs for screening. Referral placed. No overt vision problems.    Follow Up Instructions:    I discussed the assessment and treatment plan with the patient.  The patient was provided an opportunity to ask questions and all were answered. The patient agreed with the plan and demonstrated an understanding of the instructions.   The patient was advised to call back or seek an in-person evaluation if the symptoms worsen or if the condition fails to improve as anticipated.  I  provided 10 minutes of non-face-to-face time during this encounter.   Iran Planas, PA-C

## 2021-12-23 DIAGNOSIS — R0902 Hypoxemia: Secondary | ICD-10-CM | POA: Diagnosis not present

## 2021-12-30 ENCOUNTER — Telehealth: Payer: Self-pay | Admitting: Physician Assistant

## 2021-12-30 NOTE — Telephone Encounter (Signed)
Called patient, she states she is feeling like her electrolytes are off. She states she is feeling weak, but having no other specific symptoms. She told me she has a history of being in the hospital for 3 days due to low sodium and she is very worried about her levels being off. She states [name] has told her to "never go to the Emergency room". I let her know that I would run this by another provider to see if they are willing to order labs.   She threatened to write a bad review if we did not get back to her soon and was overall very rude and demanding during this phone call. She is aware other providers are working in clinic today and I would contact her when I have an answer.   Please advise if okay to order labs due to weakness.

## 2021-12-30 NOTE — Telephone Encounter (Signed)
See Dr. Madilyn Fireman message. Patient needs a visit to discuss. Please schedule.

## 2021-12-30 NOTE — Telephone Encounter (Signed)
Patient called wanting to speak with [facility]. Stated she need to come for lab workout and discuss the issues she's having. Asked if someone could call her back as soon as possible.

## 2021-12-30 NOTE — Telephone Encounter (Signed)
Please call patient and let her know that she needs an appointment we cannot just order blood work over a phone call when she has been seen for other issues in the last couple months not related to the specific issue.

## 2021-12-31 ENCOUNTER — Emergency Department (HOSPITAL_BASED_OUTPATIENT_CLINIC_OR_DEPARTMENT_OTHER)
Admission: EM | Admit: 2021-12-31 | Discharge: 2021-12-31 | Disposition: A | Discharge status: Home / Self Care | Payer: Medicare Other | Attending: Emergency Medicine | Admitting: Emergency Medicine

## 2021-12-31 ENCOUNTER — Encounter: Payer: Self-pay | Admitting: Physician Assistant

## 2021-12-31 ENCOUNTER — Telehealth (INDEPENDENT_AMBULATORY_CARE_PROVIDER_SITE_OTHER): Payer: Medicare Other | Admitting: Physician Assistant

## 2021-12-31 ENCOUNTER — Telehealth (INDEPENDENT_AMBULATORY_CARE_PROVIDER_SITE_OTHER): Payer: Medicare Other | Admitting: Psychiatry

## 2021-12-31 ENCOUNTER — Other Ambulatory Visit: Payer: Self-pay

## 2021-12-31 ENCOUNTER — Encounter (HOSPITAL_COMMUNITY): Payer: Self-pay | Admitting: Psychiatry

## 2021-12-31 ENCOUNTER — Emergency Department (HOSPITAL_BASED_OUTPATIENT_CLINIC_OR_DEPARTMENT_OTHER): Payer: Medicare Other

## 2021-12-31 ENCOUNTER — Encounter (HOSPITAL_BASED_OUTPATIENT_CLINIC_OR_DEPARTMENT_OTHER): Payer: Self-pay | Admitting: Emergency Medicine

## 2021-12-31 DIAGNOSIS — F4310 Post-traumatic stress disorder, unspecified: Secondary | ICD-10-CM

## 2021-12-31 DIAGNOSIS — R531 Weakness: Secondary | ICD-10-CM | POA: Diagnosis not present

## 2021-12-31 DIAGNOSIS — J188 Other pneumonia, unspecified organism: Secondary | ICD-10-CM | POA: Insufficient documentation

## 2021-12-31 DIAGNOSIS — D649 Anemia, unspecified: Secondary | ICD-10-CM

## 2021-12-31 DIAGNOSIS — Z79899 Other long term (current) drug therapy: Secondary | ICD-10-CM | POA: Diagnosis not present

## 2021-12-31 DIAGNOSIS — F411 Generalized anxiety disorder: Secondary | ICD-10-CM

## 2021-12-31 DIAGNOSIS — E860 Dehydration: Secondary | ICD-10-CM | POA: Diagnosis not present

## 2021-12-31 DIAGNOSIS — D72829 Elevated white blood cell count, unspecified: Secondary | ICD-10-CM | POA: Diagnosis not present

## 2021-12-31 DIAGNOSIS — J449 Chronic obstructive pulmonary disease, unspecified: Secondary | ICD-10-CM | POA: Diagnosis not present

## 2021-12-31 DIAGNOSIS — N1832 Chronic kidney disease, stage 3b: Secondary | ICD-10-CM

## 2021-12-31 DIAGNOSIS — F410 Panic disorder [episodic paroxysmal anxiety] without agoraphobia: Secondary | ICD-10-CM

## 2021-12-31 DIAGNOSIS — J189 Pneumonia, unspecified organism: Secondary | ICD-10-CM

## 2021-12-31 DIAGNOSIS — E871 Hypo-osmolality and hyponatremia: Secondary | ICD-10-CM | POA: Diagnosis not present

## 2021-12-31 DIAGNOSIS — Z8639 Personal history of other endocrine, nutritional and metabolic disease: Secondary | ICD-10-CM | POA: Diagnosis not present

## 2021-12-31 DIAGNOSIS — Z9981 Dependence on supplemental oxygen: Secondary | ICD-10-CM | POA: Insufficient documentation

## 2021-12-31 DIAGNOSIS — J439 Emphysema, unspecified: Secondary | ICD-10-CM | POA: Diagnosis not present

## 2021-12-31 LAB — URINALYSIS, MICROSCOPIC (REFLEX): RBC / HPF: NONE SEEN RBC/hpf (ref 0–5)

## 2021-12-31 LAB — COMPLETE METABOLIC PANEL WITH GFR
AG Ratio: 1 (calc) (ref 1.0–2.5)
ALT: 4 U/L — ABNORMAL LOW (ref 6–29)
AST: 11 U/L (ref 10–35)
Albumin: 3.5 g/dL — ABNORMAL LOW (ref 3.6–5.1)
Alkaline phosphatase (APISO): 87 U/L (ref 37–153)
BUN/Creatinine Ratio: 14 (calc) (ref 6–22)
BUN: 19 mg/dL (ref 7–25)
CO2: 36 mmol/L — ABNORMAL HIGH (ref 20–32)
Calcium: 9.2 mg/dL (ref 8.6–10.4)
Chloride: 93 mmol/L — ABNORMAL LOW (ref 98–110)
Creat: 1.33 mg/dL — ABNORMAL HIGH (ref 0.60–1.00)
Globulin: 3.6 g/dL (calc) (ref 1.9–3.7)
Glucose, Bld: 100 mg/dL — ABNORMAL HIGH (ref 65–99)
Potassium: 3.6 mmol/L (ref 3.5–5.3)
Sodium: 135 mmol/L (ref 135–146)
Total Bilirubin: 0.4 mg/dL (ref 0.2–1.2)
Total Protein: 7.1 g/dL (ref 6.1–8.1)
eGFR: 43 mL/min/{1.73_m2} — ABNORMAL LOW (ref >=60)

## 2021-12-31 LAB — CBC WITH DIFFERENTIAL/PLATELET
Absolute Monocytes: 1279 cells/uL — ABNORMAL HIGH (ref 200–950)
Basophils Absolute: 42 cells/uL (ref 0–200)
Basophils Relative: 0.3 %
Eosinophils Absolute: 695 cells/uL — ABNORMAL HIGH (ref 15–500)
Eosinophils Relative: 5 %
HCT: 24.8 % — ABNORMAL LOW (ref 35.0–45.0)
Hemoglobin: 8.2 g/dL — ABNORMAL LOW (ref 11.7–15.5)
Lymphs Abs: 556 cells/uL — ABNORMAL LOW (ref 850–3900)
MCH: 30.9 pg (ref 27.0–33.0)
MCHC: 33.1 g/dL (ref 32.0–36.0)
MCV: 93.6 fL (ref 80.0–100.0)
MPV: 8.7 fL (ref 7.5–12.5)
Monocytes Relative: 9.2 %
Neutro Abs: 11329 cells/uL — ABNORMAL HIGH (ref 1500–7800)
Neutrophils Relative %: 81.5 %
Platelets: 323 10*3/uL (ref 140–400)
RBC: 2.65 10*6/uL — ABNORMAL LOW (ref 3.80–5.10)
RDW: 12.5 % (ref 11.0–15.0)
Total Lymphocyte: 4 %
WBC: 13.9 10*3/uL — ABNORMAL HIGH (ref 3.8–10.8)

## 2021-12-31 LAB — URINALYSIS, ROUTINE W REFLEX MICROSCOPIC
Bilirubin Urine: NEGATIVE
Glucose, UA: NEGATIVE mg/dL
Hgb urine dipstick: NEGATIVE
Ketones, ur: NEGATIVE mg/dL
Nitrite: NEGATIVE
Protein, ur: 30 mg/dL — AB
Specific Gravity, Urine: 1.02 (ref 1.005–1.030)
pH: 5.5 (ref 5.0–8.0)

## 2021-12-31 LAB — MAGNESIUM: Magnesium: 1.4 mg/dL — ABNORMAL LOW (ref 1.5–2.5)

## 2021-12-31 MED ORDER — DOXYCYCLINE HYCLATE 100 MG PO CAPS: 100.0000 mg | ORAL_CAPSULE | Freq: Two times a day (BID) | ORAL | 0 refills | Status: DC

## 2021-12-31 MED ORDER — ACETAMINOPHEN 325 MG PO TABS
650.0000 mg | ORAL_TABLET | Freq: Once | ORAL | Status: AC
  Administered 2021-12-31: 650 mg via ORAL
  Filled 2021-12-31: qty 2

## 2021-12-31 MED ORDER — CEFTRIAXONE SODIUM 1 G IJ SOLR
1.0000 g | Freq: Once | INTRAMUSCULAR | Status: AC
  Administered 2021-12-31: 1 g via INTRAVENOUS
  Filled 2021-12-31: qty 10

## 2021-12-31 MED ORDER — LACTATED RINGERS IV BOLUS
500.0000 mL | Freq: Once | INTRAVENOUS | Status: AC
  Administered 2021-12-31: 500 mL via INTRAVENOUS

## 2021-12-31 MED ORDER — MAGNESIUM SULFATE 2 GM/50ML IV SOLN
2.0000 g | Freq: Once | INTRAVENOUS | Status: AC
  Administered 2021-12-31: 2 g via INTRAVENOUS
  Filled 2021-12-31: qty 50

## 2021-12-31 NOTE — Progress Notes (Signed)
Called patient and patient daughter and advised to go to ED. Elevated WBC, AKI, anemia, hypomagnesium. Pt did not collect urine. Unclear of etiology of infection.

## 2021-12-31 NOTE — Progress Notes (Signed)
..Virtual Visit via Telephone Note  I connected with Claire [name] on 12/31/21 at 11:10 AM EST by telephone and verified that I am speaking with the correct person using two identifiers.  Pt was at home.  I called from in the clinic.  [name].Participating in visit:  Patient: Claire [name] Provider: Iran Planas PA-C Provider in training: Judithann Sheen PA-S   I discussed the limitations, risks, security and privacy concerns of performing an evaluation and management service by telephone and the availability of in person appointments. I also discussed with the patient that there may be a patient responsible charge related to this service. The patient expressed understanding and agreed to proceed.   History of Present Illness: Pt is a 71 yo female who calls into the clinic with weakness that started 2 days ago. She just started feeling a little fatigued and then continued to weakness. Pt denies any fever, chills, body aches, ST, sinus pressure, headache, diarrhea, abdominal pain, constipation. She is afraid her sodium or potassium is off due to this happening before. She is drinking about 33-40 oz of sprite and water a day. She is eating but does not have much of an appetite. Breathing is stable.    .. Active Ambulatory Problems    Diagnosis Date Noted   PTSD (post-traumatic stress disorder) 11/10/2011   Neurosis, anxiety, panic type 11/10/2011   Acquired hypothyroidism 11/24/2014   BP (high blood pressure) 07/24/2012   Chronic use of benzodiazepine for therapeutic purpose 08/11/2015   Hyponatremia 12/19/2016   Hyperkalemia 12/19/2016   Centrilobular emphysema ([place]) 12/20/2016   Stage 3a chronic kidney disease ([place]) 01/16/2017   Iron deficiency anemia 01/16/2017   Anemia of chronic disease 05/11/2017   Low serum vitamin B12 05/14/2017   Vitamin D deficiency 05/14/2017   Depressed mood 05/14/2017   High serum parathyroid hormone (PTH) 07/19/2017   Normocytic anemia 07/19/2017   DOE  (dyspnea on exertion) 05/09/2018   Recurrent UTI 05/07/2019   Leg cramps 05/29/2019   No energy 05/29/2019   History of hypokalemia 10/09/2019   Vaccine counseling 12/24/2019   Menopausal vaginal dryness 12/24/2019   Chronic rhinitis 01/31/2020   Chronic respiratory failure with hypoxia ([place]) 01/31/2020   Unintended weight loss 02/19/2020   Macrocytic anemia 03/10/2020   Elevated ferritin 03/10/2020   Irritable bowel syndrome with diarrhea 03/10/2020   Anxiety about health 03/23/2020   Pernicious anemia 03/23/2020   Malnutrition of mild degree ([place]) 05/06/2020   Body mass index (BMI) of 19 or less in adult 05/08/2020   Constipation 07/13/2020   Lower extremity edema 07/29/2020   Clostridium difficile colitis 09/11/2020   Mild protein-calorie malnutrition ([place]) 01/19/2021   Underweight 01/22/2021   History of Clostridioides difficile colitis 02/22/2021   Medication management 03/15/2021   Cloudy urine 04/23/2021   Stage 3b chronic kidney disease ([place]) 04/23/2021   Acute cystitis with hematuria 04/26/2021   History of UTI 06/02/2021   Nasal congestion 08/06/2021   Stress due to family tension 09/02/2021   Current smoker 11/03/2021   Weakness 12/31/2021   Resolved Ambulatory Problems    Diagnosis Date Noted   Hypothyroid    Acute renal failure ([place]) 12/19/2016   Decreased hemoglobin 12/20/2016   Acute kidney injury ([place]) 03/17/2017   Sinobronchitis 06/16/2020   Past Medical History:  Diagnosis Date   Acid reflux    Anxiety    High blood pressure        Observations/Objective: No acute distress  Normal mood and appearance Normal breathing  Assessment and Plan: [name] KitchenMarland KitchenDiagnoses and all orders for this visit:  Weakness -     CBC with Differential/Platelet -     COMPLETE METABOLIC PANEL WITH GFR -     Magnesium -     Urinalysis w microscopic + reflex cultur  Stage 3b chronic kidney disease ([facility])  Hyponatremia  History of hypokalemia   No clear signs of  infection or volume loss.  Will check urine and labs.  Continue to eat and drink daily. Supplement with boost/ensure as needed. Will call with lab results.  Reassurance given that she is not losing fluids.    Follow Up Instructions:    I discussed the assessment and treatment plan with the patient. The patient was provided an opportunity to ask questions and all were answered. The patient agreed with the plan and demonstrated an understanding of the instructions.   The patient was advised to call back or seek an in-person evaluation if the symptoms worsen or if the condition fails to improve as anticipated.  I provided 20 minutes of non-face-to-face time during this encounter.   Iran Planas, PA-C

## 2021-12-31 NOTE — Telephone Encounter (Signed)
LVM for patient to call back to get appt with [facility]. Per [name] B. to  put patient in at 11:30am (Okay to double book) to come in and see her & get labs done at the visit & all the things. AM

## 2021-12-31 NOTE — ED Notes (Signed)
Patient wears 02 at home, 3L. Came in with home concentrator, and switched to our 02. SAT 96% on 3L

## 2021-12-31 NOTE — Progress Notes (Signed)
Patient ID: SHELLIA HARTL, female   DOB: 1951/09/01, 71 y.o.   MRN: [id number]  [facility] Outpatient Follow up visit [address] [phone] 71 y.o.  12/31/2021 12:14 PM  Chief Complaint:  Anxiety follow up, med review  Virtual Visit via Telephone Note  I connected with Carl Best on 12/31/21 at 12:00 PM EST by telephone and verified that I am speaking with the correct person using two identifiers.  Location: Patient: home Provider: home office   I discussed the limitations, risks, security and privacy concerns of performing an evaluation and management service by telephone and the availability of in person appointments. I also discussed with the patient that there may be a patient responsible charge related to this service. The patient expressed understanding and agreed to proceed.      I discussed the assessment and treatment plan with the patient. The patient was provided an opportunity to ask questions and all were answered. The patient agreed with the plan and demonstrated an understanding of the instructions.   The patient was advised to call back or seek an in-person evaluation if the symptoms worsen or if the condition fails to improve as anticipated.  I provided 21 minutes of non-face-to-face time during this encounter, including chart review, documentation   HPI Doing fair, still in grief of loosing dog, Feels weak and going thru blood work thru [employer] helps with anxiety, triggers for ptsd Does not want to be on any other med Ssri have tried before  Overall manageable on baseline  Modifying factor: breahting techniques. grandkids  PTSD is related to her past some flashbacks. Distraction helps      Aggravating factors: past childhood No psychosis  Medical History; Past Medical History:  Diagnosis Date   Acid reflux    Anxiety    High blood pressure    High serum parathyroid hormone (PTH) 07/19/2017   96  06/28/2014, 162 12/29/16   Hypothyroid    PTSD (post-traumatic stress disorder)     Allergies: Allergies  Allergen Reactions   Bactrim [Sulfamethoxazole-Trimethoprim]     ACUTE RENAL fAILURE/HYPONATREMIA/HYPERKALEMIA   Macrodantin [Nitrofurantoin Macrocrystal] Rash   Medrol [Methylprednisolone]     Hallucinations   Trimethoprim     Fatigue/weakness/no appetite   Zithromax [Azithromycin]     diarrhea    Medications: Outpatient Encounter Medications as of 12/31/2021  Medication Sig   albuterol (VENTOLIN HFA) 108 (90 Base) MCG/ACT inhaler INHALE 1-2 PUFFS INTO THE LUNGS EVERY 2 HOURS AS NEEDED FOR WHEEZING OR SHORTNESS OF BREATH.   AMBULATORY NON FORMULARY MEDICATION Continuous stationary and portable oxygen tanks for use with ambulation. DX: COPD with hypoxia.   AMBULATORY NON FORMULARY MEDICATION Medication Name: full face mask to use with oxygen   atenolol (TENORMIN) 25 MG tablet Take 1 tablet (25 mg total) by mouth 2 (two) times daily.   Budesonide ER 9 MG TB24 Take by mouth.   cetirizine (ZYRTEC) 10 MG chewable tablet Chew 10 mg by mouth daily.   cholecalciferol (VITAMIN D) 25 MCG (1000 UT) tablet Take 1,000 Units by mouth daily.   clonazePAM (KLONOPIN) 0.5 MG tablet Take 1 tablet (0.5 mg total) by mouth 3 (three) times daily.   cyanocobalamin 1000 MCG tablet Take by mouth.   DULERA 100-5 MCG/ACT AERO TAKE 2 PUFFS BY MOUTH TWICE A DAY   estradiol (ESTRACE) 0.1 MG/GM vaginal cream PLACE ONE APPLICATOR FULL THREE TIMES A WEEK AND ONE PEA SIZED AMOUNT APPLIED TO URETHRAL OPENING  famotidine (PEPCID) 20 MG tablet TAKE 1 TABLET BY MOUTH TWICE A DAY (Patient taking differently: Take 20 mg by mouth daily as needed.)   ferrous sulfate 325 (65 FE) MG EC tablet 1 tab PO TID every other day   hydrOXYzine (ATARAX) 10 MG tablet Take 1 tablet (10 mg total) by mouth 3 (three) times daily as needed. For anxiety.   ipratropium (ATROVENT) 0.06 % nasal spray Place 2 sprays into both nostrils 4  (four) times daily.   ipratropium-albuterol (DUONEB) 0.5-2.5 (3) MG/3ML SOLN Take 3 mLs by nebulization every 2 (two) hours as needed (wheeze, SOB).   Lactobacillus (FLORAJEN ACIDOPHILUS) CAPS Take 1 capsule by mouth daily.   levothyroxine (SYNTHROID) 75 MCG tablet Take 1 tablet (75 mcg total) by mouth daily.   saccharomyces boulardii (FLORASTOR) 250 MG capsule Take by mouth.   SPIRIVA RESPIMAT 2.5 MCG/ACT AERS Inhale 2 puffs into the lungs daily.   No facility-administered encounter medications on file as of 12/31/2021.    Family History; Family History  Problem Relation Age of Onset   Anxiety disorder Mother    OCD Daughter    Alcohol abuse Father    Anxiety disorder Sister        Labs:      Mental Status Examination;   Psychiatric Specialty Exam: Physical Exam  Review of Systems  Cardiovascular:  Negative for chest pain.  Psychiatric/Behavioral:  Negative for depression, substance abuse and suicidal ideas.    There were no vitals taken for this visit.There is no height or weight on file to calculate BMI.  General Appearance:   Eye Contact::    Speech:  Slow  Volume:  Decreased  Mood: fair  Affect:    Thought Process:  Coherent and intact  Orientation:  Full (Time, Place, and Person)  Thought Content:  Rumination  Suicidal Thoughts:  No  Homicidal Thoughts:  No  Memory:  Immediate;   Fair Recent;   Fair  Judgement:  Fair  Insight:  Shallow  Psychomotor Activity:  Normal  Concentration:  Fair  Recall:  Fair  Akathisia:  Negative  Handed:  Right  AIMS (if indicated):     Assets:  Social Support Vocational/Educational  Sleep:      Prior documentation, copy reviewed   Assessment: Axis I: Panic disorder. Possible PTSD. Rule out generalized anxiety disorder. Nicotine dependence  Axis II: Deferred  Axis III:  Past Medical History:  Diagnosis Date   Acid reflux    Anxiety    High blood pressure    High serum parathyroid hormone (PTH) 07/19/2017   96  06/28/2014, 162 12/29/16   Hypothyroid    PTSD (post-traumatic stress disorder)     Axis IV: Psychosocial. History of trauma   Treatment Plan and Summary:  Prior documentation reviewed  Generalized anxiety disorder; fair on klonopine, understands not to increase dose and side effects   PTSD: related to past triggers or if conlicts Overall doing fair, continue to work on distraction Klonopine upto tid  Says cannot do video or office visit wants to keep audio visits  Fu 71m.   Thresa Ross, MD 12/31/2021

## 2021-12-31 NOTE — ED Triage Notes (Signed)
Pt Sent by PCP for Kidney infection evaluation.

## 2021-12-31 NOTE — Telephone Encounter (Signed)
Left another voicemail for patient to call back to get this appointment scheduled for 11:30am with Jade.

## 2021-12-31 NOTE — ED Provider Notes (Signed)
[facility] EMERGENCY DEPARTMENT Provider Note    CSN: [id number] Arrival date & time: 12/31/21 1831  History Chief Complaint  Patient presents with   UTI check   Weakness    Claire [name] is a 71 y.o. female with history of COPD on home oxygen as needed. Had a tele-visit with PCP earlier today for generalized weakness, she was concerned her Na or K may have been low as this has happened to her before She has had decreased appetite recently but drinking fluids. She had labs done from that visit and was called later in the day and advised to come to the ED due to leukocytosis, hypomagnesemia and anemia. Her WBC was 13.9, no recent fevers, change in breathing/cough. She has not been on steroids recently. She has a history of UTI but denies any dysuria today. No vomiting, diarrhea or abdominal pain. She has a Mg of 1.4, down from priors. Cr today was 1.3, prior baseline about 0.9 but has been as high as 1.1 in recent months. Hgb today is 8.2, has been 8-9 for the last several months. She is on iron supplements.    Home Medications Prior to Admission medications   Medication Sig Start Date End Date Taking? Authorizing Provider  doxycycline (VIBRAMYCIN) 100 MG capsule Take 1 capsule (100 mg total) by mouth 2 (two) times daily. 12/31/21  Yes Pollyann Savoy, MD  albuterol (VENTOLIN HFA) 108 (90 Base) MCG/ACT inhaler INHALE 1-2 PUFFS INTO THE LUNGS EVERY 2 HOURS AS NEEDED FOR WHEEZING OR SHORTNESS OF BREATH. 11/19/21   Breeback, Jade L, PA-C  AMBULATORY NON FORMULARY MEDICATION Continuous stationary and portable oxygen tanks for use with ambulation. DX: COPD with hypoxia. 07/16/18   Breeback, Lonna Cobb, PA-C  AMBULATORY NON FORMULARY MEDICATION Medication Name: full face mask to use with oxygen 08/25/21   Breeback, Jade L, PA-C  atenolol (TENORMIN) 25 MG tablet Take 1 tablet (25 mg total) by mouth 2 (two) times daily. 11/19/21   Breeback, Jade L, PA-C  Budesonide ER 9 MG TB24 Take by  mouth. 09/06/21   [provider]  cetirizine (ZYRTEC) 10 MG chewable tablet Chew 10 mg by mouth daily.    [provider]  cholecalciferol (VITAMIN D) 25 MCG (1000 UT) tablet Take 1,000 Units by mouth daily.    [provider]  clonazePAM (KLONOPIN) 0.5 MG tablet Take 1 tablet (0.5 mg total) by mouth 3 (three) times daily. 12/07/21   Thresa Ross, MD  cyanocobalamin 1000 MCG tablet Take by mouth. 11/09/21   [provider]  DULERA 100-5 MCG/ACT AERO TAKE 2 PUFFS BY MOUTH TWICE A DAY 07/21/21   Breeback, Jade L, PA-C  estradiol (ESTRACE) 0.1 MG/GM vaginal cream PLACE ONE APPLICATOR FULL THREE TIMES A WEEK AND ONE PEA SIZED AMOUNT APPLIED TO URETHRAL OPENING 11/12/21   Breeback, Jade L, PA-C  famotidine (PEPCID) 20 MG tablet TAKE 1 TABLET BY MOUTH TWICE A DAY Patient taking differently: Take 20 mg by mouth daily as needed. 07/01/19   Jomarie Longs, PA-C  ferrous sulfate 325 (65 FE) MG EC tablet 1 tab PO TID every other day 07/13/17   Carlis Stable, PA-C  hydrOXYzine (ATARAX) 10 MG tablet Take 1 tablet (10 mg total) by mouth 3 (three) times daily as needed. For anxiety. 11/18/21   Breeback, Jade L, PA-C  ipratropium (ATROVENT) 0.06 % nasal spray Place 2 sprays into both nostrils 4 (four) times daily. 12/20/21   Jomarie Longs, PA-C  ipratropium-albuterol (DUONEB) 0.5-2.5 (3) MG/3ML SOLN Take 3 mLs by nebulization every 2 (two) hours as needed (wheeze, SOB). 08/05/21   Breeback, Jade L, PA-C  Lactobacillus (FLORAJEN ACIDOPHILUS) CAPS Take 1 capsule by mouth daily.    [provider]  levothyroxine (SYNTHROID) 75 MCG tablet Take 1 tablet (75 mcg total) by mouth daily. 11/03/21   Breeback, Royetta Car, PA-C  saccharomyces boulardii (FLORASTOR) 250 MG capsule Take by mouth.    [provider]  SPIRIVA RESPIMAT 2.5 MCG/ACT AERS Inhale 2 puffs into the lungs daily. 10/04/21   Breeback, Royetta Car, PA-C     Allergies    Bactrim  [sulfamethoxazole-trimethoprim], Macrodantin [nitrofurantoin macrocrystal], Medrol [methylprednisolone], Trimethoprim, and Zithromax [azithromycin]   Review of Systems   Review of Systems Please see HPI for pertinent positives and negatives  Physical Exam BP 116/65    Pulse 81    Temp 98.5 F (36.9 C) (Oral)    Resp 20    Ht 5\' 3"  (1.6 m)    Wt 45.4 kg    SpO2 97%    BMI 17.71 kg/m   Physical Exam Vitals and nursing note reviewed.  Constitutional:      Appearance: Normal appearance.  HENT:     Head: Normocephalic and atraumatic.     Nose: Nose normal.     Mouth/Throat:     Mouth: Mucous membranes are moist.  Eyes:     Extraocular Movements: Extraocular movements intact.     Conjunctiva/sclera: Conjunctivae normal.  Cardiovascular:     Rate and Rhythm: Normal rate.  Pulmonary:     Effort: Pulmonary effort is normal. No respiratory distress.     Breath sounds: Normal breath sounds. No wheezing.  Abdominal:     General: Abdomen is flat.     Palpations: Abdomen is soft.     Tenderness: There is no abdominal tenderness.  Musculoskeletal:        General: No swelling. Normal range of motion.     Cervical back: Neck supple.  Skin:    General: Skin is warm and dry.  Neurological:     General: No focal deficit present.     Mental Status: She is alert.  Psychiatric:        Mood and Affect: Mood normal.    ED Results / Procedures / Treatments   EKG None  Procedures Procedures  Medications Ordered in the ED Medications  magnesium sulfate IVPB 2 g 50 mL (0 g Intravenous Stopped 12/31/21 2145)  lactated ringers bolus 500 mL (0 mLs Intravenous Stopped 12/31/21 2145)  acetaminophen (TYLENOL) tablet 650 mg (650 mg Oral Given 12/31/21 2000)  cefTRIAXone (ROCEPHIN) 1 g in sodium chloride 0.9 % 100 mL IVPB (1 g Intravenous New Bag/Given 12/31/21 2129)    Initial Impression and Plan  Patient here with vague symptoms of general weakness. Has some mild changes in her baseline labs  but none are particularly concerning. Will replete her Magnesium, check CXR and UA for signs of infection. LR bolus for mild AKI. She otherwise is in no distress and appears at baseline.   ED Course   Clinical Course as of 12/31/21 2206  Fri Dec 31, 2021  2115 UA is neg. Reviewed CXR images, left sided pneumonia vs mass. Patient has in the past refused CXR because she was afraid what it would show so no old to compare. I offered the patient additional workup tonight including CT, admission, etc vs a trial of Abx and close outpatient follow up. She would  prefer to try a course of Abx first. She refuses Azithromycin, but will take Rocephin. She has multiple drug allergies/intolerances but think she can take doxycycline.  [CS]    Clinical Course User Index [CS] Truddie Hidden, MD     MDM Rules/Calculators/A&P Medical Decision Making Problems Addressed: Anemia, unspecified type: chronic illness or injury Community acquired pneumonia of left upper lobe of lung: acute illness or injury that poses a threat to life or bodily functions Dehydration: acute illness or injury that poses a threat to life or bodily functions Hypomagnesemia: acute illness or injury that poses a threat to life or bodily functions  Amount and/or Complexity of Data Reviewed External Data Reviewed: labs. Labs: ordered. Decision-making details documented in ED Course. Radiology: ordered and independent interpretation performed. Decision-making details documented in ED Course. ECG/medicine tests: ordered and independent interpretation performed. Decision-making details documented in ED Course.  Risk OTC drugs. Prescription drug management. Decision regarding hospitalization.    Final Clinical Impression(s) / ED Diagnoses Final diagnoses:  Community acquired pneumonia of left upper lobe of lung  Weakness  Hypomagnesemia  Anemia, unspecified type  Dehydration    Rx / DC Orders ED Discharge Orders           Ordered    doxycycline (VIBRAMYCIN) 100 MG capsule  2 times daily        12/31/21 2205             Truddie Hidden, MD 12/31/21 2206

## 2022-01-03 ENCOUNTER — Encounter: Payer: Self-pay | Admitting: Physician Assistant

## 2022-01-03 ENCOUNTER — Ambulatory Visit (INDEPENDENT_AMBULATORY_CARE_PROVIDER_SITE_OTHER): Payer: Medicare Other | Admitting: Physician Assistant

## 2022-01-03 ENCOUNTER — Other Ambulatory Visit: Payer: Self-pay

## 2022-01-03 ENCOUNTER — Ambulatory Visit: Payer: Medicare Other | Admitting: Physician Assistant

## 2022-01-03 VITALS — BP 99/50 | HR 90 | Ht 60.0 in | Wt 90.4 lb

## 2022-01-03 DIAGNOSIS — J189 Pneumonia, unspecified organism: Secondary | ICD-10-CM

## 2022-01-03 DIAGNOSIS — N179 Acute kidney failure, unspecified: Secondary | ICD-10-CM

## 2022-01-03 DIAGNOSIS — E43 Unspecified severe protein-calorie malnutrition: Secondary | ICD-10-CM | POA: Diagnosis not present

## 2022-01-03 DIAGNOSIS — J9611 Chronic respiratory failure with hypoxia: Secondary | ICD-10-CM | POA: Diagnosis not present

## 2022-01-03 DIAGNOSIS — N289 Disorder of kidney and ureter, unspecified: Secondary | ICD-10-CM | POA: Insufficient documentation

## 2022-01-03 DIAGNOSIS — F17200 Nicotine dependence, unspecified, uncomplicated: Secondary | ICD-10-CM

## 2022-01-03 DIAGNOSIS — J449 Chronic obstructive pulmonary disease, unspecified: Secondary | ICD-10-CM | POA: Diagnosis not present

## 2022-01-03 DIAGNOSIS — J432 Centrilobular emphysema: Secondary | ICD-10-CM | POA: Diagnosis not present

## 2022-01-03 DIAGNOSIS — Z8619 Personal history of other infectious and parasitic diseases: Secondary | ICD-10-CM

## 2022-01-03 NOTE — Progress Notes (Signed)
Subjective:    Patient ID: Claire [name], female    DOB: 09-Sep-1951, 71 y.o.   MRN: [id number]  HPI Pt is a 71 yo frail female with severe COPD, malnourished, CKD 3b, GAD, anemia who presents to the clinic to follow up after ED visit on 12/31/2021. She was sent to the hospital after stat labs showed leukocytosis, anemia, AKI. In ED she was found to have consolidation in the left upper lung. ? CAP vs malignancy. Treated with rocephin and doxycycline sent home. No prednisone was given. Pt is feeling a little better today. She is still having a lot of problems breathing and coughing. No fever or chills. She is very weak and has no appetite. She has lost 10lbs since her last in person visit about 1 month ago. She is tolerating the medications ok. She had one loose stool and not able to make it to the bathroom and had accident in her pants. No abdominal pain. She is taking probiotic since hx of C.diff about 1 year ago. Daughter concerned about some intermittent confusion she has noticed with her mother.   .. Active Ambulatory Problems    Diagnosis Date Noted   PTSD (post-traumatic stress disorder) 11/10/2011   Neurosis, anxiety, panic type 11/10/2011   Acquired hypothyroidism 11/24/2014   BP (high blood pressure) 07/24/2012   Chronic use of benzodiazepine for therapeutic purpose 08/11/2015   Hyponatremia 12/19/2016   Hyperkalemia 12/19/2016   Centrilobular emphysema (HCC) 12/20/2016   Stage 3a chronic kidney disease (HCC) 01/16/2017   Iron deficiency anemia 01/16/2017   Anemia of chronic disease 05/11/2017   Low serum vitamin B12 05/14/2017   Vitamin D deficiency 05/14/2017   Depressed mood 05/14/2017   High serum parathyroid hormone (PTH) 07/19/2017   Normocytic anemia 07/19/2017   DOE (dyspnea on exertion) 05/09/2018   Recurrent UTI 05/07/2019   Leg cramps 05/29/2019   No energy 05/29/2019   History of hypokalemia 10/09/2019   Vaccine counseling 12/24/2019   Menopausal vaginal dryness  12/24/2019   Chronic rhinitis 01/31/2020   Chronic respiratory failure with hypoxia (HCC) 01/31/2020   Unintended weight loss 02/19/2020   Macrocytic anemia 03/10/2020   Elevated ferritin 03/10/2020   Irritable bowel syndrome with diarrhea 03/10/2020   Anxiety about health 03/23/2020   Pernicious anemia 03/23/2020   Malnutrition of mild degree (HCC) 05/06/2020   Body mass index (BMI) of 19 or less in adult 05/08/2020   Constipation 07/13/2020   Lower extremity edema 07/29/2020   Clostridium difficile colitis 09/11/2020   Mild protein-calorie malnutrition (HCC) 01/19/2021   Underweight 01/22/2021   History of Clostridioides difficile colitis 02/22/2021   Medication management 03/15/2021   Cloudy urine 04/23/2021   Stage 3b chronic kidney disease (HCC) 04/23/2021   Acute cystitis with hematuria 04/26/2021   History of UTI 06/02/2021   Nasal congestion 08/06/2021   Stress due to family tension 09/02/2021   Current smoker 11/03/2021   Weakness 12/31/2021   Community acquired pneumonia of left upper lobe of lung 01/03/2022   Severe malnutrition (HCC) 01/03/2022   AKI (acute kidney injury) (HCC) 01/03/2022   Resolved Ambulatory Problems    Diagnosis Date Noted   Hypothyroid    Acute renal failure (HCC) 12/19/2016   Decreased hemoglobin 12/20/2016   Acute kidney injury (HCC) 03/17/2017   Sinobronchitis 06/16/2020   Past Medical History:  Diagnosis Date   Acid reflux    Anxiety    High blood pressure      Review of Systems  Constitutional:  Positive for appetite change, fatigue and unexpected weight change.  Respiratory:  Positive for cough and shortness of breath.   Neurological:  Positive for weakness.  Hematological: Negative.   Psychiatric/Behavioral:  Positive for confusion. The patient is nervous/anxious.       Objective:   Physical Exam Vitals reviewed.  Constitutional:      Appearance: She is ill-appearing.  HENT:     Head: Normocephalic.  Cardiovascular:      Rate and Rhythm: Regular rhythm. Tachycardia present.  Pulmonary:     Comments: Labored breathing Diminished lung sounds  Coarse breath sounds over left upper lobe 3L O2 with pulse ox 95 percent.  Musculoskeletal:     Cervical back: No tenderness.     Right lower leg: No edema.     Left lower leg: No edema.  Lymphadenopathy:     Cervical: No cervical adenopathy.  Neurological:     General: No focal deficit present.     Mental Status: She is oriented to person, place, and time.  Psychiatric:     Comments: anxious          Assessment & Plan:  [name] KitchenMarland KitchenMahveen was seen today for follow-up.  Diagnoses and all orders for this visit:  Community acquired pneumonia of left upper lobe of lung  Chronic respiratory failure with hypoxia (HCC)  Centrilobular emphysema (HCC)  AKI (acute kidney injury) ([place]) -     CBC with Differential/Platelet -     BASIC METABOLIC PANEL WITH GFR  Severe malnutrition ([place])  Current smoker   Continue treatment for CAP with doxycycline. Recheck CXR in 3 weeks to see if consolidation has resolved.  Stay on probiotic if noticing stooling more frequent and loose or has fever or abdominal pain please alert clinic to consider co-treatment with vancomycin.  Pt concerned about cancer. I gave as much reassurance as I can. Will recheck kidney function and CBC on Thursday to see if labs are improving.  Strongly encouraged to stop smoking. Pt continues to smoke.  Needs to be on continuous O2 to keep pulse ox up. This should help with confusion.  Concerned about weight loss. Discussed with patient how she must get calories for her body to fight off this infection and to get better. Encouraged boost supplementation.  Daughter is going to start pushing more calories and food.  Follow up in 1 week.

## 2022-01-03 NOTE — Patient Instructions (Addendum)
Labs to recheck on Wednesday and Thursday CXR in 3 weeks Must starting eating

## 2022-01-04 ENCOUNTER — Other Ambulatory Visit: Payer: Self-pay | Admitting: Physician Assistant

## 2022-01-04 ENCOUNTER — Encounter: Payer: Self-pay | Admitting: Physician Assistant

## 2022-01-04 DIAGNOSIS — J432 Centrilobular emphysema: Secondary | ICD-10-CM

## 2022-01-04 DIAGNOSIS — J441 Chronic obstructive pulmonary disease with (acute) exacerbation: Secondary | ICD-10-CM

## 2022-01-06 ENCOUNTER — Emergency Department (HOSPITAL_BASED_OUTPATIENT_CLINIC_OR_DEPARTMENT_OTHER): Payer: Medicare Other

## 2022-01-06 ENCOUNTER — Other Ambulatory Visit: Payer: Self-pay

## 2022-01-06 ENCOUNTER — Encounter (HOSPITAL_BASED_OUTPATIENT_CLINIC_OR_DEPARTMENT_OTHER): Payer: Self-pay | Admitting: *Deleted

## 2022-01-06 ENCOUNTER — Other Ambulatory Visit: Payer: Medicare Other

## 2022-01-06 ENCOUNTER — Inpatient Hospital Stay (HOSPITAL_BASED_OUTPATIENT_CLINIC_OR_DEPARTMENT_OTHER)
Admission: EM | Admit: 2022-01-06 | Discharge: 2022-01-10 | DRG: 193 | Disposition: A | Discharge status: Home / Self Care | Payer: Medicare Other | Attending: Internal Medicine | Admitting: Internal Medicine

## 2022-01-06 DIAGNOSIS — Z79899 Other long term (current) drug therapy: Secondary | ICD-10-CM | POA: Diagnosis not present

## 2022-01-06 DIAGNOSIS — F419 Anxiety disorder, unspecified: Secondary | ICD-10-CM | POA: Diagnosis not present

## 2022-01-06 DIAGNOSIS — J439 Emphysema, unspecified: Secondary | ICD-10-CM | POA: Diagnosis present

## 2022-01-06 DIAGNOSIS — J189 Pneumonia, unspecified organism: Secondary | ICD-10-CM

## 2022-01-06 DIAGNOSIS — R5381 Other malaise: Secondary | ICD-10-CM | POA: Diagnosis not present

## 2022-01-06 DIAGNOSIS — Z818 Family history of other mental and behavioral disorders: Secondary | ICD-10-CM

## 2022-01-06 DIAGNOSIS — I10 Essential (primary) hypertension: Secondary | ICD-10-CM | POA: Diagnosis present

## 2022-01-06 DIAGNOSIS — D638 Anemia in other chronic diseases classified elsewhere: Secondary | ICD-10-CM | POA: Diagnosis not present

## 2022-01-06 DIAGNOSIS — E039 Hypothyroidism, unspecified: Secondary | ICD-10-CM | POA: Diagnosis not present

## 2022-01-06 DIAGNOSIS — Z681 Body mass index (BMI) 19 or less, adult: Secondary | ICD-10-CM

## 2022-01-06 DIAGNOSIS — Z9981 Dependence on supplemental oxygen: Secondary | ICD-10-CM | POA: Diagnosis not present

## 2022-01-06 DIAGNOSIS — Z20822 Contact with and (suspected) exposure to covid-19: Secondary | ICD-10-CM | POA: Diagnosis present

## 2022-01-06 DIAGNOSIS — Z720 Tobacco use: Secondary | ICD-10-CM | POA: Diagnosis present

## 2022-01-06 DIAGNOSIS — Z888 Allergy status to other drugs, medicaments and biological substances status: Secondary | ICD-10-CM | POA: Diagnosis not present

## 2022-01-06 DIAGNOSIS — F17210 Nicotine dependence, cigarettes, uncomplicated: Secondary | ICD-10-CM | POA: Diagnosis present

## 2022-01-06 DIAGNOSIS — Z79890 Hormone replacement therapy (postmenopausal): Secondary | ICD-10-CM

## 2022-01-06 DIAGNOSIS — J984 Other disorders of lung: Secondary | ICD-10-CM

## 2022-01-06 DIAGNOSIS — J9620 Acute and chronic respiratory failure, unspecified whether with hypoxia or hypercapnia: Secondary | ICD-10-CM | POA: Diagnosis not present

## 2022-01-06 DIAGNOSIS — F410 Panic disorder [episodic paroxysmal anxiety] without agoraphobia: Secondary | ICD-10-CM | POA: Diagnosis present

## 2022-01-06 DIAGNOSIS — D509 Iron deficiency anemia, unspecified: Secondary | ICD-10-CM | POA: Diagnosis not present

## 2022-01-06 DIAGNOSIS — J9621 Acute and chronic respiratory failure with hypoxia: Secondary | ICD-10-CM | POA: Diagnosis present

## 2022-01-06 DIAGNOSIS — J449 Chronic obstructive pulmonary disease, unspecified: Secondary | ICD-10-CM | POA: Diagnosis not present

## 2022-01-06 DIAGNOSIS — F4310 Post-traumatic stress disorder, unspecified: Secondary | ICD-10-CM | POA: Diagnosis present

## 2022-01-06 DIAGNOSIS — F418 Other specified anxiety disorders: Secondary | ICD-10-CM | POA: Diagnosis present

## 2022-01-06 DIAGNOSIS — Z882 Allergy status to sulfonamides status: Secondary | ICD-10-CM

## 2022-01-06 DIAGNOSIS — R0602 Shortness of breath: Secondary | ICD-10-CM | POA: Diagnosis not present

## 2022-01-06 DIAGNOSIS — K219 Gastro-esophageal reflux disease without esophagitis: Secondary | ICD-10-CM | POA: Diagnosis not present

## 2022-01-06 DIAGNOSIS — J432 Centrilobular emphysema: Secondary | ICD-10-CM

## 2022-01-06 DIAGNOSIS — I700 Atherosclerosis of aorta: Secondary | ICD-10-CM | POA: Diagnosis not present

## 2022-01-06 DIAGNOSIS — J188 Other pneumonia, unspecified organism: Principal | ICD-10-CM | POA: Diagnosis present

## 2022-01-06 DIAGNOSIS — R4702 Dysphasia: Secondary | ICD-10-CM

## 2022-01-06 DIAGNOSIS — J9611 Chronic respiratory failure with hypoxia: Secondary | ICD-10-CM

## 2022-01-06 DIAGNOSIS — E43 Unspecified severe protein-calorie malnutrition: Secondary | ICD-10-CM | POA: Diagnosis not present

## 2022-01-06 LAB — CBC WITH DIFFERENTIAL/PLATELET
Abs Immature Granulocytes: 0.14 10*3/uL — ABNORMAL HIGH (ref 0.00–0.07)
Basophils Absolute: 0.1 10*3/uL (ref 0.0–0.1)
Basophils Relative: 0 %
Eosinophils Absolute: 0.1 10*3/uL (ref 0.0–0.5)
Eosinophils Relative: 0 %
HCT: 24 % — ABNORMAL LOW (ref 36.0–46.0)
Hemoglobin: 7.6 g/dL — ABNORMAL LOW (ref 12.0–15.0)
Immature Granulocytes: 1 %
Lymphocytes Relative: 7 %
Lymphs Abs: 1 10*3/uL (ref 0.7–4.0)
MCH: 30.8 pg (ref 26.0–34.0)
MCHC: 31.7 g/dL (ref 30.0–36.0)
MCV: 97.2 fL (ref 80.0–100.0)
Monocytes Absolute: 1.3 10*3/uL — ABNORMAL HIGH (ref 0.1–1.0)
Monocytes Relative: 9 %
Neutro Abs: 11.8 10*3/uL — ABNORMAL HIGH (ref 1.7–7.7)
Neutrophils Relative %: 83 %
Platelets: 325 10*3/uL (ref 150–400)
RBC: 2.47 MIL/uL — ABNORMAL LOW (ref 3.87–5.11)
RDW: 13.1 % (ref 11.5–15.5)
WBC: 14.2 10*3/uL — ABNORMAL HIGH (ref 4.0–10.5)
nRBC: 0 % (ref 0.0–0.2)

## 2022-01-06 LAB — COMPREHENSIVE METABOLIC PANEL
ALT: 19 U/L (ref 0–44)
AST: 38 U/L (ref 15–41)
Albumin: 2.8 g/dL — ABNORMAL LOW (ref 3.5–5.0)
Alkaline Phosphatase: 180 U/L — ABNORMAL HIGH (ref 38–126)
Anion gap: 12 (ref 5–15)
BUN: 20 mg/dL (ref 8–23)
CO2: 34 mmol/L — ABNORMAL HIGH (ref 22–32)
Calcium: 8.9 mg/dL (ref 8.9–10.3)
Chloride: 88 mmol/L — ABNORMAL LOW (ref 98–111)
Creatinine, Ser: 1.1 mg/dL — ABNORMAL HIGH (ref 0.44–1.00)
GFR, Estimated: 54 mL/min — ABNORMAL LOW (ref >60)
Glucose, Bld: 120 mg/dL — ABNORMAL HIGH (ref 70–99)
Potassium: 3.7 mmol/L (ref 3.5–5.1)
Sodium: 134 mmol/L — ABNORMAL LOW (ref 135–145)
Total Bilirubin: 0.9 mg/dL (ref 0.3–1.2)
Total Protein: 7.2 g/dL (ref 6.5–8.1)

## 2022-01-06 LAB — MAGNESIUM: Magnesium: 1.1 mg/dL — ABNORMAL LOW (ref 1.7–2.4)

## 2022-01-06 LAB — RESP PANEL BY RT-PCR (FLU A&B, COVID) ARPGX2
Influenza A by PCR: NEGATIVE
Influenza B by PCR: NEGATIVE
SARS Coronavirus 2 by RT PCR: NEGATIVE

## 2022-01-06 LAB — PROTIME-INR
INR: 1.2 (ref 0.8–1.2)
Prothrombin Time: 14.8 seconds (ref 11.4–15.2)

## 2022-01-06 LAB — LACTIC ACID, PLASMA
Lactic Acid, Venous: 1.9 mmol/L (ref 0.5–1.9)
Lactic Acid, Venous: 1.2 mmol/L (ref 0.5–1.9)

## 2022-01-06 LAB — APTT: aPTT: 40 seconds — ABNORMAL HIGH (ref 24–36)

## 2022-01-06 MED ORDER — SODIUM CHLORIDE 0.9 % IV SOLN
2.0000 g | Freq: Once | INTRAVENOUS | Status: AC
  Administered 2022-01-06: 2 g via INTRAVENOUS
  Filled 2022-01-06: qty 2

## 2022-01-06 MED ORDER — MAGNESIUM SULFATE 2 GM/50ML IV SOLN
2.0000 g | Freq: Once | INTRAVENOUS | Status: DC
  Filled 2022-01-06: qty 50

## 2022-01-06 MED ORDER — SODIUM CHLORIDE 0.9 % IV SOLN
100.0000 mg | Freq: Once | INTRAVENOUS | Status: AC
  Administered 2022-01-06: 100 mg via INTRAVENOUS
  Filled 2022-01-06: qty 100

## 2022-01-06 MED ORDER — MAGNESIUM SULFATE 2 GM/50ML IV SOLN
2.0000 g | Freq: Once | INTRAVENOUS | Status: AC
  Administered 2022-01-06: 2 g via INTRAVENOUS

## 2022-01-06 MED ORDER — SODIUM CHLORIDE 0.9 % IV SOLN: INTRAVENOUS | Status: DC | PRN

## 2022-01-06 MED ORDER — LACTATED RINGERS IV SOLN: INTRAVENOUS | Status: DC

## 2022-01-06 MED ORDER — CLONAZEPAM 0.5 MG PO TABS
0.5000 mg | ORAL_TABLET | Freq: Once | ORAL | Status: AC
  Administered 2022-01-06: 0.5 mg via ORAL
  Filled 2022-01-06: qty 1

## 2022-01-06 MED ORDER — SODIUM CHLORIDE 0.9 % IV SOLN
2.0000 g | Freq: Two times a day (BID) | INTRAVENOUS | Status: DC
  Filled 2022-01-06: qty 2

## 2022-01-06 NOTE — ED Notes (Signed)
Patient transported to CT 

## 2022-01-06 NOTE — ED Provider Notes (Signed)
[facility] EMERGENCY DEPARTMENT  Provider Note  CSN: [id number] Arrival date & time: 01/06/22 1353  History Chief Complaint  Patient presents with   Weakness   Pneumonia    Claire [name] is a 71 y.o. female known to me from previous ED visit on 1/27 for cough, SOB, mild leukocytosis and mild hypomagnesemia. She has a history of COPD on home oxygen and numerous allergies/intolerances. She frequently refuses medications or diagnostic studies without good reason. When I saw her last week she had agreed to a CXR for the first time in many years which showed a mass-like consolidation, PNA vs tumor vs both. I had recommended additional workup then including CT and possible admission which she refused. She eventually agreed to Rocephin (but refused Zithromax) and agreed to take Doxycycline as an outpatient with plan for close PCP follow up to ensure resolution of her consolidation or further workup if not improved. She has been taking her Abx but has been feeling worse, generally ill, SOB, increased anxiety. Daughter states she has been confused but may have been taking more than the recommended amount of Xanax at home. She is amenable to admission at this point pending a repeat ED workup.    Home Medications Prior to Admission medications   Medication Sig Start Date End Date Taking? Authorizing Provider  albuterol (VENTOLIN HFA) 108 (90 Base) MCG/ACT inhaler INHALE 1-2 PUFFS INTO THE LUNGS EVERY 2 HOURS AS NEEDED FOR WHEEZING OR SHORTNESS OF BREATH. Patient taking differently: Inhale 1-2 puffs into the lungs every 2 (two) hours as needed for wheezing or shortness of breath. 01/04/22  Yes Breeback, Jade L, PA-C  AMBULATORY NON FORMULARY MEDICATION Continuous stationary and portable oxygen tanks for use with ambulation. DX: COPD with hypoxia. 07/16/18  Yes Breeback, Jade L, PA-C  AMBULATORY NON FORMULARY MEDICATION Medication Name: full face mask to use with oxygen 08/25/21  Yes Breeback, Jade  L, PA-C  atenolol (TENORMIN) 25 MG tablet Take 1 tablet (25 mg total) by mouth 2 (two) times daily. Patient taking differently: Take 50 mg by mouth 2 (two) times daily. 11/19/21  Yes Breeback, Jade L, PA-C  cetirizine (ZYRTEC) 10 MG chewable tablet Chew 10 mg by mouth daily.   Yes [provider]  cholecalciferol (VITAMIN D) 25 MCG (1000 UT) tablet Take 1,000 Units by mouth daily.   Yes [provider]  clonazePAM (KLONOPIN) 0.5 MG tablet Take 1 tablet (0.5 mg total) by mouth 3 (three) times daily. 12/07/21  Yes Thresa RossAkhtar, Nadeem, MD  dicyclomine (BENTYL) 10 MG capsule Take 10-20 mg by mouth in the morning and at bedtime.   Yes [provider]  DULERA 100-5 MCG/ACT AERO TAKE 2 PUFFS BY MOUTH TWICE A DAY Patient taking differently: Inhale 2 puffs into the lungs in the morning and at bedtime. 07/21/21  Yes Breeback, Jade L, PA-C  estradiol (ESTRACE) 0.1 MG/GM vaginal cream PLACE ONE APPLICATOR FULL THREE TIMES A WEEK AND ONE PEA SIZED AMOUNT APPLIED TO URETHRAL OPENING Patient taking differently: Place 1 Applicatorful vaginally 3 (three) times a week. 11/12/21  Yes Breeback, Jade L, PA-C  famotidine (PEPCID) 20 MG tablet TAKE 1 TABLET BY MOUTH TWICE A DAY Patient taking differently: Take 20 mg by mouth daily as needed for heartburn or indigestion. 07/01/19  Yes Breeback, Jade L, PA-C  ferrous sulfate 325 (65 FE) MG EC tablet 1 tab PO TID every other day Patient taking differently: Take 325 mg by mouth See admin instructions. 1 t TID every  other day 07/13/17  Yes Carlis Stable, PA-C  ipratropium (ATROVENT) 0.06 % nasal spray Place 2 sprays into both nostrils 4 (four) times daily. 12/20/21  Yes Breeback, Jade L, PA-C  ipratropium-albuterol (DUONEB) 0.5-2.5 (3) MG/3ML SOLN Take 3 mLs by nebulization every 2 (two) hours as needed (wheeze, SOB). 08/05/21  Yes Breeback, Jade L, PA-C  Lactobacillus (FLORAJEN ACIDOPHILUS) CAPS Take 1 capsule by mouth daily.   Yes [provider]  levothyroxine (SYNTHROID) 75 MCG tablet Take 1 tablet (75 mcg total) by mouth daily. 11/03/21  Yes Breeback, Jade L, PA-C  SPIRIVA RESPIMAT 2.5 MCG/ACT AERS Inhale 2 puffs into the lungs daily. Patient taking differently: Inhale 2 puffs into the lungs daily. 10/04/21  Yes Breeback, Jade L, PA-C     Allergies    Bactrim [sulfamethoxazole-trimethoprim], Macrodantin [nitrofurantoin macrocrystal], Medrol [methylprednisolone], Trimethoprim, and Zithromax [azithromycin]   Review of Systems   Review of Systems Please see HPI for pertinent positives and negatives  Physical Exam BP 112/62    Pulse 75    Temp 97.8 F (36.6 C) (Oral)    Resp 20    Ht 5' (1.524 m)    Wt 41 kg    SpO2 91%    BMI 17.65 kg/m   Physical Exam Vitals and nursing note reviewed.  Constitutional:      Appearance: Normal appearance.  HENT:     Head: Normocephalic and atraumatic.     Nose: Nose normal.     Mouth/Throat:     Mouth: Mucous membranes are moist.  Eyes:     Extraocular Movements: Extraocular movements intact.     Conjunctiva/sclera: Conjunctivae normal.  Cardiovascular:     Rate and Rhythm: Normal rate.  Pulmonary:     Effort: Pulmonary effort is normal.     Breath sounds: Rhonchi and rales present.  Abdominal:     General: Abdomen is flat.     Palpations: Abdomen is soft.     Tenderness: There is no abdominal tenderness.  Musculoskeletal:        General: No swelling. Normal range of motion.     Cervical back: Neck supple.  Skin:    General: Skin is warm and dry.  Neurological:     General: No focal deficit present.     Mental Status: She is alert.  Psychiatric:        Mood and Affect: Mood normal.    ED Results / Procedures / Treatments   EKG EKG Interpretation  Date/Time:  Thursday January 06 2022 15:03:48 EST Ventricular Rate:  89 PR Interval:  77 QRS Duration: 94 QT Interval:  383 QTC Calculation: 466 R Axis:   90 Text Interpretation: Sinus or ectopic atrial  rhythm Atrial premature complex Short PR interval Anteroseptal infarct, age indeterminate Confirmed by Marily Memos [id number]) on 01/08/2022 4:56:27 AM  Procedures Procedures  Medications Ordered in the ED Medications  0.9 %  sodium chloride infusion ( Intravenous New Bag/Given 01/06/22 1825)  clonazePAM (KLONOPIN) tablet 0.5 mg (0.5 mg Oral Given 01/08/22 1114)  hydrOXYzine (ATARAX) tablet 10 mg (has no administration in time range)  heparin injection 5,000 Units (5,000 Units Subcutaneous Given 01/08/22 1338)  acetaminophen (TYLENOL) tablet 650 mg (650 mg Oral Given 01/07/22 2148)    Or  acetaminophen (TYLENOL) suppository 650 mg ( Rectal See Alternative 01/07/22 2148)  ondansetron (ZOFRAN) tablet 4 mg (has no administration in time range)    Or  ondansetron (ZOFRAN) injection 4 mg (has no administration in time range)  nicotine (NICODERM CQ - dosed in mg/24 hours) patch 21 mg (21 mg Transdermal Patient Refused/Not Given 01/08/22 1127)  albuterol (VENTOLIN HFA) 108 (90 Base) MCG/ACT inhaler 1-2 puff (2 puffs Inhalation Given 01/07/22 0900)  ferrous sulfate tablet 325 mg (325 mg Oral Given 01/08/22 1347)  ipratropium-albuterol (DUONEB) 0.5-2.5 (3) MG/3ML nebulizer solution 3 mL (has no administration in time range)  levothyroxine (SYNTHROID) tablet 75 mcg (75 mcg Oral Patient Refused/Not Given 01/08/22 0516)  ampicillin-sulbactam (UNASYN) 1.5 g in sodium chloride 0.9 % 100 mL IVPB (1.5 g Intravenous New Bag/Given 01/08/22 1337)  feeding supplement (KATE FARMS STANDARD 1.4) liquid 325 mL (325 mLs Oral Given 01/08/22 1337)  multivitamin with minerals tablet 1 tablet (1 tablet Oral Given 01/08/22 1115)  guaiFENesin (ROBITUSSIN) 100 MG/5ML liquid 10 mL (10 mLs Oral Given 01/08/22 1118)  LORazepam (ATIVAN) injection 1 mg (has no administration in time range)  haloperidol (HALDOL) tablet 1 mg (has no administration in time range)    Or  haloperidol lactate (HALDOL) injection 1 mg (has no administration in time range)   acidophilus (RISAQUAD) capsule 1 capsule (1 capsule Oral Given 01/08/22 1117)  atenolol (TENORMIN) tablet 50 mg (50 mg Oral Given 01/08/22 1115)  dicyclomine (BENTYL) capsule 10 mg (has no administration in time range)  cholecalciferol (VITAMIN D) tablet 1,000 Units (1,000 Units Oral Given 01/08/22 1116)  revefenacin (YUPELRI) nebulizer solution 175 mcg (has no administration in time range)  budesonide (PULMICORT) nebulizer solution 0.25 mg (has no administration in time range)  arformoterol (BROVANA) nebulizer solution 15 mcg (has no administration in time range)  ceFEPIme (MAXIPIME) 2 g in sodium chloride 0.9 % 100 mL IVPB (0 g Intravenous Stopped 01/06/22 1900)  doxycycline (VIBRAMYCIN) 100 mg in sodium chloride 0.9 % 250 mL IVPB (0 mg Intravenous Stopped 01/06/22 2017)  magnesium sulfate IVPB 2 g 50 mL (2 g Intravenous New Bag/Given 01/06/22 2006)  clonazePAM (KLONOPIN) tablet 0.5 mg (0.5 mg Oral Given by Other 01/06/22 2057)     Initial Impression and Plan  Will recheck labs to compare to previous. Send for CT chest to eval her consolidation, add CT head due to confusion although that may be due to Xanax use. Patient and daughter are aware of eventual plan for admission.   ED Course   Clinical Course as of 01/08/22 1607  Thu Jan 06, 2022  1518 CBC with similar WBC, anemia is slightly worsened.  [CS]  1519 Covid/flu remains neg.  [CS]  1542 Care of the patient signed out to Dr. Rhunette Croft at the change of shift.  [CS]    Clinical Course User Index [CS] Pollyann Savoy, MD     MDM Rules/Calculators/A&P Medical Decision Making Problems Addressed: Community acquired pneumonia of left upper lobe of lung: acute illness or injury that poses a threat to life or bodily functions Dysphasia: acute illness or injury  Amount and/or Complexity of Data Reviewed Labs: ordered. Radiology: ordered.  Risk Prescription drug management. Decision regarding hospitalization.    Final Clinical  Impression(s) / ED Diagnoses Final diagnoses:  Community acquired pneumonia of left upper lobe of lung    Rx / DC Orders ED Discharge Orders     None        Pollyann Savoy, MD 01/08/22 [phone]

## 2022-01-06 NOTE — Sepsis Progress Note (Signed)
eLink is following this Code Sepsis. °

## 2022-01-06 NOTE — ED Provider Notes (Signed)
°  Physical Exam  BP 140/84    Pulse 82    Temp 98 F (36.7 C) (Oral)    Resp (!) 29    Ht 5' (1.524 m)    Wt 41 kg    SpO2 96%    BMI 17.65 kg/m   Physical Exam  Procedures  .Critical Care Performed by: Varney Biles, MD Authorized by: Varney Biles, MD   Critical care provider statement:    Critical care time (minutes):  30   Critical care was time spent personally by me on the following activities:  Development of treatment plan with patient or surrogate, discussions with consultants, evaluation of patient's response to treatment, examination of patient, ordering and review of laboratory studies, ordering and review of radiographic studies, ordering and performing treatments and interventions, pulse oximetry, re-evaluation of patient's condition, review of old charts and obtaining history from patient or surrogate  ED Course / MDM   Clinical Course as of 01/06/22 1810  Thu Jan 06, 2022  1518 CBC with similar WBC, anemia is slightly worsened.  [CS]  [id number] Covid/flu remains neg.  [CS]  [id number] Care of the patient signed out to Dr. Kathrynn Humble at the change of shift.  [CS]    Clinical Course User Index [CS] Truddie Hidden, MD   Medical Decision Making Patient had come in with chief complaint of worsening shortness of breath.  Recently seen in the hospital for pneumonia and started on doxycycline.  Did not improve, returns with increased oxygen requirement.  She has history of advanced COPD.  I assumed care from Dr. Karle Starch. Patient's lab workup reveals elevated white count.  She is also noted to be slightly tachypneic.  With increased oxygen requirement, code sepsis was called at the time of CT results.  Patient CT scan is showing left upper lobe pneumonia.  There is also left lower lobe consolidation concerning for pneumonia versus neoplasm versus bulla.  Results of the CT scan including the lower lobe finding discussed with the patient.  Advised that she will need to see a  pulmonologist or primary care doctor and get a repeat CAT scan in future.  Family at the bedside.  Indicates that patient is requiring more oxygen now than before. With her history of advanced COPD, we will proceed with admission request at this time.  Diagnosis -acute on chronic respiratory failure Severe sepsis with CAP  Amount and/or Complexity of Data Reviewed Independent Historian: guardian External Data Reviewed: notes. Labs: ordered. Decision-making details documented in ED Course. Radiology: ordered and independent interpretation performed.    Details: CXR independently reviewed ECG/medicine tests: ordered.  Risk Prescription drug management. Decision regarding hospitalization.         Varney Biles, MD 01/06/22 [phone]

## 2022-01-06 NOTE — ED Triage Notes (Signed)
States she is a [employer] social [employer] and does not feel well today. Pt is on home oxygen.

## 2022-01-06 NOTE — Progress Notes (Signed)
Pharmacy Antibiotic Note  Claire [name] is a 71 y.o. female admitted on 01/06/2022 with pneumonia.  Pharmacy has been consulted for cefepime dosing.  WBC elevated. CrCl ~ 30 mL/min   Plan: -Cefepime 2 gm IV Q 12 hours -Monitor CBC, renal fx, cultures and clinical progress  Height: 5' (152.4 cm) Weight: 41 kg (90 lb 6.2 oz) IBW/kg (Calculated) : 45.5  Temp (24hrs), Avg:98 F (36.7 C), Min:98 F (36.7 C), Max:98 F (36.7 C)  Recent Labs  Lab 12/31/21 0000 01/06/22 1441  WBC 13.9* 14.2*  CREATININE 1.33* 1.10*  LATICACIDVEN  --  1.9    Estimated Creatinine Clearance: 30.4 mL/min (A) (by C-G formula based on SCr of 1.1 mg/dL (H)).    Allergies  Allergen Reactions   Bactrim [Sulfamethoxazole-Trimethoprim]     ACUTE RENAL fAILURE/HYPONATREMIA/HYPERKALEMIA   Macrodantin [Nitrofurantoin Macrocrystal] Rash   Medrol [Methylprednisolone]     Hallucinations   Trimethoprim     Fatigue/weakness/no appetite   Zithromax [Azithromycin]     diarrhea    Antimicrobials this admission: Cefepime 2/2 >>  IV Doxycycline 2/2 >>   Dose adjustments this admission:   Microbiology results: 2/2 BCx:    Thank you for allowing pharmacy to be a part of this patients care.  Vinnie Level, PharmD., BCCCP Clinical Pharmacist Please refer to [facility] for unit-specific pharmacist

## 2022-01-07 ENCOUNTER — Encounter (HOSPITAL_COMMUNITY): Payer: Self-pay | Admitting: Internal Medicine

## 2022-01-07 DIAGNOSIS — J9611 Chronic respiratory failure with hypoxia: Secondary | ICD-10-CM | POA: Diagnosis present

## 2022-01-07 DIAGNOSIS — R5381 Other malaise: Secondary | ICD-10-CM

## 2022-01-07 DIAGNOSIS — J189 Pneumonia, unspecified organism: Secondary | ICD-10-CM

## 2022-01-07 DIAGNOSIS — J984 Other disorders of lung: Secondary | ICD-10-CM

## 2022-01-07 DIAGNOSIS — J449 Chronic obstructive pulmonary disease, unspecified: Secondary | ICD-10-CM | POA: Diagnosis present

## 2022-01-07 DIAGNOSIS — E43 Unspecified severe protein-calorie malnutrition: Secondary | ICD-10-CM | POA: Diagnosis present

## 2022-01-07 DIAGNOSIS — Z720 Tobacco use: Secondary | ICD-10-CM | POA: Diagnosis present

## 2022-01-07 LAB — PROCALCITONIN: Procalcitonin: 0.55 ng/mL

## 2022-01-07 LAB — CBC
HCT: 22.8 % — ABNORMAL LOW (ref 36.0–46.0)
Hemoglobin: 7.2 g/dL — ABNORMAL LOW (ref 12.0–15.0)
MCH: 30.9 pg (ref 26.0–34.0)
MCHC: 31.6 g/dL (ref 30.0–36.0)
MCV: 97.9 fL (ref 80.0–100.0)
Platelets: 296 10*3/uL (ref 150–400)
RBC: 2.33 MIL/uL — ABNORMAL LOW (ref 3.87–5.11)
RDW: 13.3 % (ref 11.5–15.5)
WBC: 12.9 10*3/uL — ABNORMAL HIGH (ref 4.0–10.5)
nRBC: 0 % (ref 0.0–0.2)

## 2022-01-07 LAB — EXPECTORATED SPUTUM ASSESSMENT W GRAM STAIN, RFLX TO RESP C

## 2022-01-07 LAB — IRON AND TIBC
Iron: 48 ug/dL (ref 28–170)
Saturation Ratios: 38 % — ABNORMAL HIGH (ref 10.4–31.8)
TIBC: 126 ug/dL — ABNORMAL LOW (ref 250–450)
UIBC: 78 ug/dL

## 2022-01-07 LAB — BASIC METABOLIC PANEL
Anion gap: 11 (ref 5–15)
BUN: 25 mg/dL — ABNORMAL HIGH (ref 8–23)
CO2: 33 mmol/L — ABNORMAL HIGH (ref 22–32)
Calcium: 8.5 mg/dL — ABNORMAL LOW (ref 8.9–10.3)
Chloride: 87 mmol/L — ABNORMAL LOW (ref 98–111)
Creatinine, Ser: 0.93 mg/dL (ref 0.44–1.00)
GFR, Estimated: >60 mL/min (ref >60)
Glucose, Bld: 94 mg/dL (ref 70–99)
Potassium: 3.5 mmol/L (ref 3.5–5.1)
Sodium: 131 mmol/L — ABNORMAL LOW (ref 135–145)

## 2022-01-07 LAB — FERRITIN: Ferritin: 573 ng/mL — ABNORMAL HIGH (ref 11–307)

## 2022-01-07 LAB — MAGNESIUM: Magnesium: 1.8 mg/dL (ref 1.7–2.4)

## 2022-01-07 MED ORDER — GUAIFENESIN 100 MG/5ML PO LIQD
10.0000 mL | Freq: Two times a day (BID) | ORAL | Status: DC
  Administered 2022-01-08 – 2022-01-10 (×4): 10 mL via ORAL
  Filled 2022-01-07 (×5): qty 10

## 2022-01-07 MED ORDER — HEPARIN SODIUM (PORCINE) 5000 UNIT/ML IJ SOLN
5000.0000 [IU] | Freq: Three times a day (TID) | INTRAMUSCULAR | Status: DC
  Administered 2022-01-07 – 2022-01-10 (×5): 5000 [IU] via SUBCUTANEOUS
  Filled 2022-01-07 (×8): qty 1

## 2022-01-07 MED ORDER — FERROUS SULFATE 325 (65 FE) MG PO TABS
325.0000 mg | ORAL_TABLET | Freq: Three times a day (TID) | ORAL | Status: DC
  Administered 2022-01-07 – 2022-01-10 (×8): 325 mg via ORAL
  Filled 2022-01-07 (×8): qty 1

## 2022-01-07 MED ORDER — MOMETASONE FURO-FORMOTEROL FUM 200-5 MCG/ACT IN AERO
2.0000 | INHALATION_SPRAY | Freq: Two times a day (BID) | RESPIRATORY_TRACT | Status: DC
  Administered 2022-01-07 – 2022-01-08 (×2): 2 via RESPIRATORY_TRACT
  Filled 2022-01-07: qty 8.8

## 2022-01-07 MED ORDER — IPRATROPIUM-ALBUTEROL 0.5-2.5 (3) MG/3ML IN SOLN
3.0000 mL | RESPIRATORY_TRACT | Status: DC | PRN
  Administered 2022-01-09: 3 mL via RESPIRATORY_TRACT
  Filled 2022-01-07 (×4): qty 3

## 2022-01-07 MED ORDER — LEVOTHYROXINE SODIUM 50 MCG PO TABS
75.0000 ug | ORAL_TABLET | Freq: Every day | ORAL | Status: DC
  Administered 2022-01-07 – 2022-01-10 (×2): 75 ug via ORAL
  Filled 2022-01-07 (×5): qty 1

## 2022-01-07 MED ORDER — HYDROXYZINE HCL 10 MG PO TABS: 10.0000 mg | ORAL_TABLET | Freq: Three times a day (TID) | ORAL | Status: DC | PRN

## 2022-01-07 MED ORDER — SODIUM CHLORIDE 0.9 % IV SOLN
1.5000 g | Freq: Three times a day (TID) | INTRAVENOUS | Status: DC
  Administered 2022-01-07 – 2022-01-10 (×10): 1.5 g via INTRAVENOUS
  Filled 2022-01-07 (×10): qty 4
  Filled 2022-01-07: qty 1.5
  Filled 2022-01-07: qty 4

## 2022-01-07 MED ORDER — ONDANSETRON HCL 4 MG PO TABS: 4.0000 mg | ORAL_TABLET | Freq: Four times a day (QID) | ORAL | Status: DC | PRN

## 2022-01-07 MED ORDER — ONDANSETRON HCL 4 MG/2ML IJ SOLN: 4.0000 mg | Freq: Four times a day (QID) | INTRAMUSCULAR | Status: DC | PRN

## 2022-01-07 MED ORDER — KATE FARMS STANDARD 1.4 PO LIQD
325.0000 mL | Freq: Two times a day (BID) | ORAL | Status: DC
  Administered 2022-01-07 – 2022-01-08 (×3): 325 mL via ORAL
  Filled 2022-01-07 (×7): qty 325

## 2022-01-07 MED ORDER — ALBUTEROL SULFATE HFA 108 (90 BASE) MCG/ACT IN AERS
1.0000 | INHALATION_SPRAY | RESPIRATORY_TRACT | Status: DC | PRN
  Administered 2022-01-07 (×2): 2 via RESPIRATORY_TRACT

## 2022-01-07 MED ORDER — ADULT MULTIVITAMIN W/MINERALS CH
1.0000 | ORAL_TABLET | Freq: Every day | ORAL | Status: DC
  Administered 2022-01-07 – 2022-01-10 (×4): 1 via ORAL
  Filled 2022-01-07 (×4): qty 1

## 2022-01-07 MED ORDER — ACETAMINOPHEN 325 MG PO TABS
650.0000 mg | ORAL_TABLET | Freq: Four times a day (QID) | ORAL | Status: DC | PRN
  Administered 2022-01-07 – 2022-01-10 (×4): 650 mg via ORAL
  Filled 2022-01-07 (×4): qty 2

## 2022-01-07 MED ORDER — MOMETASONE FURO-FORMOTEROL FUM 100-5 MCG/ACT IN AERO
2.0000 | INHALATION_SPRAY | Freq: Two times a day (BID) | RESPIRATORY_TRACT | Status: DC
  Administered 2022-01-07: 2 via RESPIRATORY_TRACT

## 2022-01-07 MED ORDER — ACETAMINOPHEN 650 MG RE SUPP: 650.0000 mg | Freq: Four times a day (QID) | RECTAL | Status: DC | PRN

## 2022-01-07 MED ORDER — NICOTINE 21 MG/24HR TD PT24
21.0000 mg | MEDICATED_PATCH | Freq: Every day | TRANSDERMAL | Status: DC
  Filled 2022-01-07 (×3): qty 1

## 2022-01-07 MED ORDER — CLONAZEPAM 0.5 MG PO TABS
0.5000 mg | ORAL_TABLET | Freq: Three times a day (TID) | ORAL | Status: DC
  Administered 2022-01-07 – 2022-01-10 (×10): 0.5 mg via ORAL
  Filled 2022-01-07 (×10): qty 1

## 2022-01-07 MED ORDER — TIOTROPIUM BROMIDE MONOHYDRATE 2.5 MCG/ACT IN AERS
2.0000 | INHALATION_SPRAY | Freq: Every day | RESPIRATORY_TRACT | Status: DC
  Administered 2022-01-07: 2 via RESPIRATORY_TRACT

## 2022-01-07 MED ORDER — TIOTROPIUM BROMIDE MONOHYDRATE 2.5 MCG/ACT IN AERS: 2.0000 | INHALATION_SPRAY | Freq: Every day | RESPIRATORY_TRACT | Status: DC

## 2022-01-07 NOTE — H&P (Signed)
History and Physical    Claire [name] UJW:[id number] DOB: 10/28/1951 DOA: 01/06/2022  PCP: Jomarie LongsBreeback, Jade L, PA-C  Patient coming from: Home  I have personally briefly reviewed patient's old medical [facility]  Chief Complaint: Shortness of breath  HPI: Claire [name] is a 71 y.o. female with medical history significant for COPD on nocturnal supplemental O2, hypertension, anxiety/PTSD/panic disorder, hypothyroidism, anemia, ongoing tobacco use who presented to the ED for evaluation of worsening shortness of breath.  Recently seen in the ED 12/31/2021 for generalized weakness.  CXR showed masslike consolidation within the left upper lobe.  EDP discussed findings with patient and recommendation for further work-up with CT imaging however patient refused and preferred to return home with trial of oral antibiotics.  She was discharged with a prescription of doxycycline.  Patient returned to the ED with persistent shortness of breath.  Patient states over the last 6 days she has had new cough occasionally productive of yellow sputum.  She has had poor appetite with diminished oral intake over the last 6 days.  She has had chills but denies subjective fevers or diaphoresis/night sweats.    Daughter concerned that she has intermittent episodes where she seems to be aspirating when eating.  She does report ongoing tobacco use, 1 pack/day chronically.  She has been using supplemental oxygen via [place] nightly for the last few years but requiring during the day over the last week.  Daughter also reports she has been having intermittent confusion/delirium.  [facility] ED Course   Labs/Imaging on admission: I have personally reviewed following labs and imaging studies.  Initial vitals showed BP 92/68, pulse 90, RR 22, temp 98.0 F, SPO2 97% on 3 L supplemental O2 via [place].  Labs show WBC 14.2, hemoglobin 7.6 (baseline 8-10), platelets 325,000, sodium 134, potassium 3.7, magnesium 1.1,  bicarb 34, BUN 20, creatinine 1.10, serum glucose 120, lactic acid 1.9.  Blood cultures collected and in process.  SARS-CoV-2 and influenza PCR negative.  CT head without contrast negative for acute intracranial abnormality.  CT chest without contrast shows masslike consolidation within the left upper lobe with appearance most consistent with pneumonia.  Thick-walled cavity within the superior segment of the left lower lobe with other cavitating areas of consolidation with the left lower lobe noted.  Patient was given IV cefepime, IV magnesium 2 g.  The hospitalist service was consulted to admit for further evaluation and management.  Review of Systems: All systems reviewed and are negative except as documented in history of present illness above.   Past Medical History:  Diagnosis Date   Acid reflux    Anxiety    High blood pressure    High serum parathyroid hormone (PTH) 07/19/2017   96 06/28/2014, 162 12/29/16   Hypothyroid    PTSD (post-traumatic stress disorder)     History reviewed. No pertinent surgical history.  Social History:  reports that she has been smoking cigarettes. She has a 12.50 pack-year smoking history. She has never used smokeless tobacco. She reports that she does not drink alcohol and does not use drugs.  Allergies  Allergen Reactions   Bactrim [Sulfamethoxazole-Trimethoprim]     ACUTE RENAL fAILURE/HYPONATREMIA/HYPERKALEMIA   Macrodantin [Nitrofurantoin Macrocrystal] Rash   Medrol [Methylprednisolone]     Hallucinations   Trimethoprim     Fatigue/weakness/no appetite   Zithromax [Azithromycin]     diarrhea    Family History  Problem Relation Age of Onset   Anxiety disorder Mother  OCD Daughter    Alcohol abuse Father    Anxiety disorder Sister      Prior to Admission medications   Medication Sig Start Date End Date Taking? Authorizing Provider  albuterol (VENTOLIN HFA) 108 (90 Base) MCG/ACT inhaler INHALE 1-2 PUFFS INTO THE LUNGS EVERY 2  HOURS AS NEEDED FOR WHEEZING OR SHORTNESS OF BREATH. 01/04/22   Breeback, Jade L, PA-C  AMBULATORY NON FORMULARY MEDICATION Continuous stationary and portable oxygen tanks for use with ambulation. DX: COPD with hypoxia. 07/16/18   Breeback, Lonna Cobb, PA-C  AMBULATORY NON FORMULARY MEDICATION Medication Name: full face mask to use with oxygen 08/25/21   Breeback, Jade L, PA-C  atenolol (TENORMIN) 25 MG tablet Take 1 tablet (25 mg total) by mouth 2 (two) times daily. 11/19/21   Breeback, Jade L, PA-C  Budesonide ER 9 MG TB24 Take by mouth. 09/06/21   [provider]  cetirizine (ZYRTEC) 10 MG chewable tablet Chew 10 mg by mouth daily.    [provider]  cholecalciferol (VITAMIN D) 25 MCG (1000 UT) tablet Take 1,000 Units by mouth daily.    [provider]  clonazePAM (KLONOPIN) 0.5 MG tablet Take 1 tablet (0.5 mg total) by mouth 3 (three) times daily. 12/07/21   Thresa Ross, MD  cyanocobalamin 1000 MCG tablet Take by mouth. 11/09/21   [provider]  doxycycline (VIBRAMYCIN) 100 MG capsule Take 1 capsule (100 mg total) by mouth 2 (two) times daily. 12/31/21   Geoffery Lyons, MD  DULERA 100-5 MCG/ACT AERO TAKE 2 PUFFS BY MOUTH TWICE A DAY 07/21/21   Breeback, Jade L, PA-C  estradiol (ESTRACE) 0.1 MG/GM vaginal cream PLACE ONE APPLICATOR FULL THREE TIMES A WEEK AND ONE PEA SIZED AMOUNT APPLIED TO URETHRAL OPENING 11/12/21   Breeback, Jade L, PA-C  famotidine (PEPCID) 20 MG tablet TAKE 1 TABLET BY MOUTH TWICE A DAY Patient taking differently: Take 20 mg by mouth daily as needed. 07/01/19   Jomarie Longs, PA-C  ferrous sulfate 325 (65 FE) MG EC tablet 1 tab PO TID every other day 07/13/17   Carlis Stable, PA-C  hydrOXYzine (ATARAX) 10 MG tablet Take 1 tablet (10 mg total) by mouth 3 (three) times daily as needed. For anxiety. 11/18/21   Breeback, Jade L, PA-C  ipratropium (ATROVENT) 0.06 % nasal spray Place 2 sprays into both nostrils 4 (four) times daily. 12/20/21    Breeback, Jade L, PA-C  ipratropium-albuterol (DUONEB) 0.5-2.5 (3) MG/3ML SOLN Take 3 mLs by nebulization every 2 (two) hours as needed (wheeze, SOB). 08/05/21   Breeback, Jade L, PA-C  Lactobacillus (FLORAJEN ACIDOPHILUS) CAPS Take 1 capsule by mouth daily.    [provider]  levothyroxine (SYNTHROID) 75 MCG tablet Take 1 tablet (75 mcg total) by mouth daily. 11/03/21   Breeback, Lonna Cobb, PA-C  saccharomyces boulardii (FLORASTOR) 250 MG capsule Take by mouth.    [provider]  SPIRIVA RESPIMAT 2.5 MCG/ACT AERS Inhale 2 puffs into the lungs daily. 10/04/21   Jomarie Longs, PA-C    Physical Exam: Vitals:   01/06/22 1900 01/06/22 2130 01/06/22 2258 01/06/22 2345  BP: (!) 141/77 138/71 (!) 156/86   Pulse: 83 82 84   Resp: (!) 28 (!) 31 13 (!) 23  Temp:  98.1 F (36.7 C) 98.7 F (37.1 C)   TempSrc:  Oral Oral   SpO2: 93% 94% 99%   Weight:      Height:       Constitutional: Chronically ill-appearing thin  woman sitting up in bed, NAD, calm, comfortable Eyes: PERRL, lids and conjunctivae normal ENMT: Mucous membranes are moist. Posterior pharynx clear of any exudate or lesions.Normal dentition.  Neck: normal, supple, no masses. Respiratory: Distant breath sounds. Normal respiratory effort while on 2 L supplemental O2 via [place]. No accessory muscle use.  Cardiovascular: Regular rate and rhythm, no murmurs / rubs / gallops. No extremity edema. Abdomen: no tenderness, no masses palpated. Musculoskeletal: Thin extremities, no clubbing / cyanosis. No joint deformity upper and lower extremities. Good ROM, no contractures. Normal muscle tone.  Skin: no rashes, lesions, ulcers. No induration Neurologic: CN 2-12 grossly intact. Sensation intact. Strength 5/5 in all 4.  Psychiatric: Alert, oriented to self, place, knows daughter at bedside.  Does exhibit some confusion with repetitive questioning and situational orientation.  EKG: Personally reviewed. Sinus rhythm without acute  ischemic changes, motion artifact.  Similar to prior.  Assessment/Plan Principal Problem:   Cavitary pneumonia Active Problems:   COPD (chronic obstructive pulmonary disease) (HCC)   Anemia of chronic disease   Anxiety   Hypomagnesemia   Acquired hypothyroidism   Tobacco use   VIOLA KINNICK is a 71 y.o. female with medical history significant for COPD on nocturnal supplemental O2, hypertension, anxiety/PTSD/panic disorder, hypothyroidism, anemia, ongoing tobacco use who is admitted with left upper lobe masslike consolidative pneumonia and left lower lobe cavitary pneumonia versus cavitary neoplasm.  Assessment and Plan: * Cavitary pneumonia CT imaging shows masslike consolidative left upper lobe pneumonia and left lower lobe cavitary pneumonia versus superinfected bulla versus cavitating neoplasm.  WBC 14.2 on admission.  She does have chronic ongoing tobacco use. -Started on IV Unasyn -Follow blood cultures -Obtain sputum culture, strep pneumonia and Legionella urinary antigens -Supplemental oxygen as needed -Consider pulmonology consult while in hospital  COPD (chronic obstructive pulmonary disease) (HCC)- (present on admission) Not in acute exacerbation on admission. -Continue Dulera, Spiriva, DuoNeb and albuterol as needed -Continue supplemental oxygen as needed  Anemia of chronic disease- (present on admission) Hemoglobin 7.6 on admission compared to baseline 8-10.  History of iron deficiency anemia with likely anemia of chronic disease.  No obvious bleeding. -Continue iron supplement  Anxiety- (present on admission) - Continue home Klonopin 0.5 mg 3 times daily and Atarax as needed  Hypomagnesemia- (present on admission) IV supplement given in the ED.  Repeat lab in AM.  Acquired hypothyroidism- (present on admission) Continue Synthroid.  Tobacco use- (present on admission) Reports ongoing tobacco use.  Nicotine patch ordered.  DVT prophylaxis: heparin injection  5,000 Units Start: 01/07/22 0600 Code Status: DNI Family Communication: Discussed with patient's daughter at bedside Disposition Plan: From home, dispo pending clinical progress Consults called: None Severity of Illness: The appropriate patient status for this patient is INPATIENT. Inpatient status is judged to be reasonable and necessary in order to provide the required intensity of service to ensure the patient's safety. The patient's presenting symptoms, physical exam findings, and initial radiographic and laboratory data in the context of their chronic comorbidities is felt to place them at high risk for further clinical deterioration. Furthermore, it is not anticipated that the patient will be medically stable for discharge from the hospital within 2 midnights of admission.   * I certify that at the point of admission it is my clinical judgment that the patient will require inpatient hospital care spanning beyond 2 midnights from the point of admission due to high intensity of service, high risk for further deterioration and high frequency of surveillance required.*  Latashia Koch  Allena KatzPatel MD [facility]  If 7PM-7AM, please contact night-coverage [email or web address]  01/07/2022, 1:18 AM

## 2022-01-07 NOTE — Assessment & Plan Note (Addendum)
Nutrition Status: Nutrition Problem: Severe Malnutrition Etiology: chronic illness (COPD) Signs/Symptoms: severe fat depletion, severe muscle depletion, energy intake < or equal to 75% for > or equal to 1 month Interventions: MVI, Education

## 2022-01-07 NOTE — Progress Notes (Signed)
Initial Nutrition Assessment  DOCUMENTATION CODES:   Severe malnutrition in context of chronic illness, Underweight  INTERVENTION:   -[employer] 1.4 PO BID, each provides 455 kcals and 20g protein  -Multivitamin with minerals daily  -Placed "High Calorie High Protein" handout in discharge instructions    NUTRITION DIAGNOSIS:   Severe Malnutrition related to chronic illness (COPD) as evidenced by severe fat depletion, severe muscle depletion, energy intake < or equal to 75% for > or equal to 1 month.  GOAL:   Patient will meet greater than or equal to 90% of their needs  MONITOR:   PO intake, Supplement acceptance, Labs, Weight trends, I & O's  REASON FOR ASSESSMENT:   Malnutrition Screening Tool    ASSESSMENT:   71 y.o. female with medical history significant for COPD on nocturnal supplemental O2, hypertension, anxiety/PTSD/panic disorder, hypothyroidism, anemia, ongoing tobacco use who presented to the ED for evaluation of worsening shortness of breath  Patient in room, son at bedside.  Pt reports she has had poor appetite and not eating well. She reports having taste changes, foods tasting more bland than  usual. Discussed strategies to help with this.  Was having confusion PTA which did not help intakes. Per pt's son, pt may eat 2 meals a day on a good day. Typically less than this and sometimes only eats bites of her meals. Pt states she "wants to eat what she wants" and won't eat if she can't have it.  Pt likes to eat salads with boiled eggs and dips. She is agreeable to protein shakes but does not tolerate Ensure/Boost drinks. She would like to try plant based supplement [employer] [place].   Pt reports UBW of 119 lbs. States her weight loss began 7-8 months ago.  Current weight: 98 lbs. Per weight records, pt has lost 8 lbs since March 2022 (8% wt loss x 11 months, insignificant for time frame).   Medications: Ferrous sulfate, Multivitamin with minerals daily  Labs  reviewed:  Low Na  NUTRITION - FOCUSED PHYSICAL EXAM:  Flowsheet Row Most Recent Value  Orbital Region Mild depletion  Upper Arm Region Severe depletion  Thoracic and Lumbar Region Moderate depletion  Buccal Region Severe depletion  Temple Region Severe depletion  Clavicle Bone Region Severe depletion  Clavicle and Acromion Bone Region Severe depletion  Scapular Bone Region Severe depletion  Dorsal Hand Severe depletion  Patellar Region Severe depletion  Anterior Thigh Region Severe depletion  Posterior Calf Region Severe depletion  Edema (RD Assessment) None  Hair Reviewed  [thinning]  Eyes Reviewed  Mouth Reviewed  [dry] uses dentures  Skin Reviewed  Nails Reviewed       Diet Order:   Diet Order             Diet regular Room service appropriate? Yes; Fluid consistency: Thin  Diet effective now                   EDUCATION NEEDS:   Education needs have been addressed  Skin:  Skin Assessment: Reviewed RN Assessment  Last BM:  PTA  Height:   Ht Readings from Last 1 Encounters:  01/06/22 5' (1.524 m)    Weight:   Wt Readings from Last 1 Encounters:  01/06/22 41 kg    BMI:  Body mass index is 17.65 kg/m.  Estimated Nutritional Needs:   Kcal:  1450-1650  Protein:  75-90g  Fluid:  1.7L/day  Clayton Bibles, MS, RD, LDN Inpatient Clinical Dietitian Contact information available via  Amion

## 2022-01-07 NOTE — Discharge Instructions (Signed)

## 2022-01-07 NOTE — Hospital Course (Addendum)
71 y.o. female with medical history significant for COPD on nocturnal supplemental O2, hypertension, anxiety/PTSD/panic disorder, hypothyroidism, anemia, ongoing tobacco use who presented to the ED for evaluation of worsening shortness of breath.  Recently seen in the ED 12/31/2021 for generalized weakness.  CXR showed masslike consolidation within the left upper lobe.  EDP discussed findings with patient and recommendation for further work-up with CT imaging however patient refused and preferred to return home with trial of oral antibiotics.  She was discharged with a prescription of doxycycline.  Patient returned to the ED with persistent shortness of breath.  Patient states over the last 6 days she has had new cough occasionally productive of yellow sputum.  She has had poor appetite with diminished oral intake over the last 6 days.  She has had chills but denies subjective fevers or diaphoresis/night sweats.     Daughter concerned that she has intermittent episodes where she seems to be aspirating when eating.  She does report ongoing tobacco use, 1 pack/day chronically.  She has been using supplemental oxygen via [place] nightly for the last few years but requiring during the day over the last week.  Daughter also reports she has been having intermittent confusion/delirium.   [facility] ED Course   Labs/Imaging on admission: I have personally reviewed following labs and imaging studies.   Initial vitals showed BP 92/68, pulse 90, RR 22, temp 98.0 F, SPO2 97% on 3 L supplemental O2 via [place].   Labs show WBC 14.2, hemoglobin 7.6 (baseline 8-10), platelets 325,000, sodium 134, potassium 3.7, magnesium 1.1, bicarb 34, BUN 20, creatinine 1.10, serum glucose 120, lactic acid 1.9.   Blood cultures collected and in process.  SARS-CoV-2 and influenza PCR negative.   CT head without contrast negative for acute intracranial abnormality.   CT chest without contrast shows masslike consolidation within  the left upper lobe with appearance most consistent with pneumonia.  Thick-walled cavity within the superior segment of the left lower lobe with other cavitating areas of consolidation with the left lower lobe noted.   Patient was given IV cefepime, IV magnesium 2 g.  The hospitalist service was consulted to admit for further evaluation and management

## 2022-01-07 NOTE — Evaluation (Signed)
Clinical/Bedside Swallow Evaluation Patient Details  Name: Claire [name] MRN: [id number] Date of Birth: 1951-07-27  Today's Date: 01/07/2022 Time: SLP Start Time (ACUTE ONLY): 1215 SLP Stop Time (ACUTE ONLY): 1235 SLP Time Calculation (min) (ACUTE ONLY): 20 min  Past Medical History:  Past Medical History:  Diagnosis Date   Acid reflux    Anxiety    High blood pressure    High serum parathyroid hormone (PTH) 07/19/2017   96 06/28/2014, 162 12/29/16   Hypothyroid    PTSD (post-traumatic stress disorder)    Past Surgical History: History reviewed. No pertinent surgical history. HPI:  pt is a 71 yo female adm to [facility] with cough, concern for pna.  CT chest concern for cavitary lesion - PMH + for COPD, anxiety, tobacco use, reflux, hypothryoidism, panic d/o, PTSD.  Sawllow eval ordered.      Pt and son deny pt having dysphagia - attribute cough only to rhinitis and pna.  Pt does takes Pepcid at home.    Assessment / Plan / Recommendation  Clinical Impression  Pt with negative CN exam and able to feed self and follow directions.  She was observed consuming lunch including eggs, toast, coffee and water.  Swallow appeared timely with adequate "mastication" and no oral retention. Occasional cough noted after swallow - more with liquids than solids but can not directly coorelate to intake.  Larger concern was frequent eructation - at times requring pt to swallow several times after - concerning for potential esophageal component contributing to aspiration risk.   Son present and advises that pt frequently belches with intake - - currently not more than baseline.  Pt did not pass 3 ounce [place] water challenge due to requiring rest break- consuming approx 2 ounces before rest needed.  SLP concerned that pt may be having aspiration due to primary esophageal deficits. Recommend consider esophagram with sweep of pharyngeal area for aspiration risk.  Messaged Md and received order for esophagram. Advised pt that  she will need to be NPO for 3 hours prior to exam, she and son agreeable to test.  Phoned xray who advised they will attempt to test pt today.  Will follow up Monday 2/6 to assess for indication of instrumental oropharyngeal assessment.  Provided general aspiration precautions to pt and son. SLP Visit Diagnosis: Dysphagia, unspecified (R13.10)    Aspiration Risk  Mild aspiration risk    Diet Recommendation Regular;Thin liquid   Liquid Administration via: Cup;Straw Medication Administration: Whole meds with liquid Supervision: Patient able to self feed Compensations: Slow rate;Small sips/bites Postural Changes: Seated upright at 90 degrees;Remain upright for at least 30 minutes after po intake    Other  Recommendations Recommended Consults: Consider esophageal assessment Oral Care Recommendations: Oral care BID    Recommendations for follow up therapy are one component of a multi-disciplinary discharge planning process, led by the attending physician.  Recommendations may be updated based on patient status, additional functional criteria and insurance authorization.  Follow up Recommendations Other (comment) (tbd)      Assistance Recommended at Discharge    Functional Status Assessment Patient has had a recent decline in their functional status and demonstrates the ability to make significant improvements in function in a reasonable and predictable amount of time.  Frequency and Duration min 1 x/week  1 week       Prognosis Prognosis for Safe Diet Advancement: Fair Barriers to Reach Goals: Motivation      Swallow Study   General Date of Onset: 01/07/22 HPI:  pt is a 71 yo female adm to [facility] with cough, concern for pna.  CT chest concern for cavitary lesion - PMH + for COPD, anxiety, tobacco use, reflux, hypothryoidism, panic d/o, PTSD.  Sawllow eval ordered.      Pt and son deny pt having dysphagia - attribute cough only to rhinitis and pna.  Pt does takes Pepcid at home. Diet  Prior to this Study: Regular;Thin liquids Temperature Spikes Noted: No Respiratory Status: Nasal cannula (3) History of Recent Intubation: No Behavior/Cognition: Alert;Cooperative;Pleasant mood Oral Cavity Assessment: Within Functional Limits Oral Care Completed by SLP: No Oral Cavity - Dentition: Edentulous Vision: Functional for self-feeding Self-Feeding Abilities: Able to feed self Patient Positioning: Upright in bed Baseline Vocal Quality: Normal Volitional Cough: Strong Volitional Swallow: Able to elicit    Oral/Motor/Sensory Function Overall Oral Motor/Sensory Function: Within functional limits   Ice Chips Ice chips: Not tested   Thin Liquid Thin Liquid: Impaired Presentation: Self Fed;Straw Other Comments: pt did not pass 3 ounce [place] water challenge due to requiring rest break, occasional cough observed with intake - but could not directly coorelate to po intake    Nectar Thick Nectar Thick Liquid: Not tested   Honey Thick Honey Thick Liquid: Not tested   Puree Puree: Not tested   Solid     Solid: Within functional limits Presentation: Self Fed Other Comments: eggs and toast      Chales Abrahams 01/07/2022,12:44 PM  Rolena Infante, MS [facility] SLP Acute Rehab Services Office [phone] Cell [phone]

## 2022-01-07 NOTE — Progress Notes (Signed)
Patient admitted from [facility]. She is confused, paranoid but alert to herself, place and situation. States it is [birth year] and is her birthday. She states she is  over 31 and can decide what she wants and what she doesn't want. She is currently refusing vital signs to be taking. She has ripped out her IVs and is refusing Iv to be restarted. Cardiac monitoring d/c by Md. She is refusing continuous pulse oxymetry monitoring. Family is at bedside but not helpful. Will attempt again to restart IV at 0600 when her antibiotics is done. Md has been at bedside to talk to patient.

## 2022-01-07 NOTE — Assessment & Plan Note (Addendum)
CT scan of the chest showing cavitary pneumonia.  Patient does have a history of COPD and is on 2 to 3 L of oxygen at baseline.  Continue IV Unasyn at this time.  Pulmonary following.  Sputum culture with rare WBC.  Blood cultures negative in 4 days.  No clinical suspicion for PTB.  Pulmonary plans for repeat CT scan before attempting bronchoscopy.  Palliative care has been consulted as well.  Possibility of malignancy as well. Pulmonary recommends 3 to 4 weeks of Augmentin on discharge.  If her cavitary lesions persist at that time might need bronchoscopy. Patient will have to follow-up with pulmonary on 01/20/2022.

## 2022-01-07 NOTE — Progress Notes (Signed)
Pharmacy Antibiotic Note  Claire [name] is a 71 y.o. female admitted on 01/06/2022 with cavitary pneumonia.  Pharmacy has been consulted for Unasyn dosing.  WBC elevated. CrCl ~ 30 mL/min   Plan: -Unasyn 1.5gm IV q8h -Monitor CBC, renal fx, cultures and clinical progress  Height: 5' (152.4 cm) Weight: 41 kg (90 lb 6.2 oz) IBW/kg (Calculated) : 45.5  Temp (24hrs), Avg:98.3 F (36.8 C), Min:98 F (36.7 C), Max:98.7 F (37.1 C)  Recent Labs  Lab 01/06/22 1441 01/06/22 2327  WBC 14.2*  --   CREATININE 1.10*  --   LATICACIDVEN 1.9 1.2     Estimated Creatinine Clearance: 30.4 mL/min (A) (by C-G formula based on SCr of 1.1 mg/dL (H)).    Allergies  Allergen Reactions   Bactrim [Sulfamethoxazole-Trimethoprim]     ACUTE RENAL fAILURE/HYPONATREMIA/HYPERKALEMIA   Macrodantin [Nitrofurantoin Macrocrystal] Rash   Medrol [Methylprednisolone]     Hallucinations   Trimethoprim     Fatigue/weakness/no appetite   Zithromax [Azithromycin]     diarrhea    Antimicrobials this admission: Cefepime 2/2 >> 2/3 IV Doxycycline 2/2 >> 2/3 Unasyn 2/3 >>  Dose adjustments this admission:  Microbiology results: 2/2 BCx:    Thank you for allowing pharmacy to be a part of this patients care.  Junita Push, PharmD, BCPS 01/07/2022@1 :07 AM

## 2022-01-07 NOTE — Hospital Course (Addendum)
Claire [name] is a 71 y.o. female with medical history significant for COPD on nocturnal supplemental O2, hypertension, anxiety/PTSD/panic disorder, hypothyroidism, anemia, ongoing tobacco presented to hospital with worsening shortness of breath, generalized weakness.  Chest x-ray in the ED on 12/31/2021 had showed masslike consolidation in the left upper lobe and patient was given oral antibiotics and discharged home but then came to the ED with persistent shortness of breath.  In the ED patient was noted to have leukocytosis.  CT of the chest showed masslike consolidation in the left upper lobe with thick walled cavity in the left lower lobe.  Patient was given IV antibiotics and was admitted hospital for further evaluation and treatment.

## 2022-01-07 NOTE — Consult Note (Signed)
NAME:  Claire [name], MRN:  [id number], DOB:  Jun 14, 1951, LOS: 1 ADMISSION DATE:  01/06/2022, CONSULTATION DATE:  01/07/2022 REFERRING MD: Posey Pronto , CHIEF COMPLAINT:   Cavitary Pneumonia  History of Present Illness:  Claire [name] is a 71 y.o. female  with medical history significant for COPD on nocturnal supplemental O2, hypertension, anxiety/PTSD/panic disorder, hypothyroidism, anemia, ongoing tobacco use who presented to the ED for evaluation of worsening shortness of breath.  Recently seen in the ED 12/31/2021 for generalized weakness.  CXR showed masslike consolidation within the left upper lobe.  EDP discussed findings with patient and recommendation for further work-up with CT imaging however patient refused and preferred to return home with trial of oral antibiotics.  She was discharged with a prescription of doxycycline. She returned to the ED 01/06/2022 with persistent shortness of breath  new cough occasionally productive of yellow sputum.  She has had poor appetite with diminished oral intake over the last 6 days since ED visit.  She has had chills but denies subjective fevers or diaphoresis/night sweats.  Patient's daughter had specific concerns about aspirating while eating, and about intermittent confusion/ delirium. [name]  Per her son, she is supposed to be wearing oxygen with exertional activities, and he states she does not do this . She has an [employer] device.   Initial vitals showed BP 92/68, pulse 90, RR 22, temp 98.0 F, SPO2 97% on 3 L supplemental O2 via [place].   Labs show WBC 14.2, hemoglobin 7.6 (baseline 8-10), platelets 325,000, sodium 134, potassium 3.7, magnesium 1.1, bicarb 34, BUN 20, creatinine 1.10, serum glucose 120, lactic acid 1.9.   Blood cultures collected and in process.  SARS-CoV-2 and influenza PCR negative.   CT head without contrast negative for acute intracranial abnormality.  CT chest without contrast shows masslike consolidation within the left upper lobe with  appearance most consistent with pneumonia.  Thick-walled cavity within the superior segment of the left lower lobe with other cavitating areas of consolidation with the left lower lobe noted.   Patient has failed OP therapy with oral antibiotics . Patient was given IV cefepime, IV magnesium 2 g.   She is currently being treated with Unasyn per the primary team.   PCCM have been consulted to assist with management of care of cavitary Pneumonia .   Pertinent  Medical History   Past Medical History:  Diagnosis Date   Acid reflux    Anxiety    High blood pressure    High serum parathyroid hormone (PTH) 07/19/2017   96 06/28/2014, 162 12/29/16   Hypothyroid    PTSD (post-traumatic stress disorder)       Significant Hospital Events: Including procedures, antibiotic start and stop dates in addition to other pertinent events   12/31/2021>> ED Visit with Doxycycline   CT Chest  CT Chest 01/06/22 reviewed with severe bullous emphysema with upper lobe predominance, LUL consolidation with ?cavitation vs bullae and multiple thick walled cavities in LLL including in superior segment measuring 6.8 x 3.4 and 1.4x1.6x4.6 cm.   Interim History / Subjective:  Awake and alert, on 3 L [place] with sats of 99%, mild respiratory distress with minimal exertion.  Pt gets confused and has difficulty retaining her though processes, is forgetful mid sentence  Mag 1.8/ Na 131/ K 3.5/ Cl 87/ CO2 33/ BUN 25/ 0.93/Ca 8.5/ WBC 12.9/HGB 7.2/Platelets 296 PCT 0.55   Objective   Blood pressure 108/70, pulse 82, temperature 98.6 F (37 C), temperature source Oral, resp. rate  14, height 5' (1.524 m), weight 41 kg, SpO2 99 %.        Intake/Output Summary (Last 24 hours) at 01/07/2022 1522 Last data filed at 01/06/2022 2054 Gross per 24 hour  Intake 346.53 ml  Output --  Net 346.53 ml   Filed Weights   01/06/22 1358  Weight: 41 kg    Examination: General: Awake and alert, in mild resp. With minimal exertion  HENT:  NCAT, temporal wasting, MM dry,No LAD, No JVD Lungs: Bilateral chest excursion , very distant breath sounds , diminished per bases Cardiovascular:  S1, S2, RRR, No RMG Abdomen: Thin, Soft, ND, NT, Body mass index is 17.65 kg/m. Extremities: No obvious deformities, warm with brisk capillary refill Neuro: Awake and alert, MAE x 3, She has a hard time  GU:  Not assessed  Resolved Hospital Problem list     Assessment & Plan:  Cavitary Pneumonia vs Neoplasm COPD/ Bullous Emphysema Chronic Hypoxic Respiratory Failure Current Every Day Smoker ( At least 55 pack years)  with 19 pound weight loss over last 6 months ? Aspiration>>  speech recommend esophogram to be done 01/10/2022 Plan Sputum Culture for AFB Q 8 hours x 3 >> Sputum induction orders intiated Sputum Culture  Quantiferon Gold to R/O TB Add Flutter valve and IS Add Guaifenesin as ordered Esophogram 01/10/2022 to eval for aspiration as is ordered Continue Dulera, Spiriva and Duonebs and albuterol nebs  We will increase Dulera to 200- 5 dosing as she over uses her rescue at home Titrate oxygen for sats > 88% Continue current antibiotics per primary. Will need 3-4 weeks of Augmentin with Probiotic upon discharge  ( has Hx of C Diff in 2016) with plan for repeat CT Chest without contrast and follow up with Pulmonary.   If cavitary lesions persistent, will warrant bronchoscopy to rule out possible malignancy. Will arrange when patient closer to discharge Consider Pulmonary Rehab once better Smoking cessation Counseling done.        Best Practice (right click and "Reselect all SmartList Selections" daily)  Per Primary Team  Labs   CBC: Recent Labs  Lab 01/06/22 1441 01/07/22 0400  WBC 14.2* 12.9*  NEUTROABS 11.8*  --   HGB 7.6* 7.2*  HCT 24.0* 22.8*  MCV 97.2 97.9  PLT 325 0000000    Basic Metabolic Panel: Recent Labs  Lab 01/06/22 1441 01/07/22 0400  NA 134* 131*  K 3.7 3.5  CL 88* 87*  CO2 34* 33*  GLUCOSE 120*  94  BUN 20 25*  CREATININE 1.10* 0.93  CALCIUM 8.9 8.5*  MG 1.1* 1.8   GFR: Estimated Creatinine Clearance: 35.9 mL/min (by C-G formula based on SCr of 0.93 mg/dL). Recent Labs  Lab 01/06/22 1441 01/06/22 2327 01/07/22 0400  PROCALCITON  --   --  0.55  WBC 14.2*  --  12.9*  LATICACIDVEN 1.9 1.2  --     Liver Function Tests: Recent Labs  Lab 01/06/22 1441  AST 38  ALT 19  ALKPHOS 180*  BILITOT 0.9  PROT 7.2  ALBUMIN 2.8*   No results for input(s): LIPASE, AMYLASE in the last 168 hours. No results for input(s): AMMONIA in the last 168 hours.  ABG No results found for: PHART, PCO2ART, PO2ART, HCO3, TCO2, ACIDBASEDEF, O2SAT   Coagulation Profile: Recent Labs  Lab 01/06/22 1441  INR 1.2    Cardiac Enzymes: No results for input(s): CKTOTAL, CKMB, CKMBINDEX, TROPONINI in the last 168 hours.  HbA1C: No results found for: HGBA1C  CBG: No results for input(s): GLUCAP in the last 168 hours.  Review of Systems:   + shortness of breath with minimal exertion , + Cough, + Green / Yellow thick secretions + Chills  Past Medical History:  She,  has a past medical history of Acid reflux, Anxiety, High blood pressure, High serum parathyroid hormone (PTH) (07/19/2017), Hypothyroid, and PTSD (post-traumatic stress disorder).   Surgical History:  History reviewed. No pertinent surgical history.   Social History:   reports that she has been smoking cigarettes. She has a 12.50 pack-year smoking history. She has never used smokeless tobacco. She reports that she does not drink alcohol and does not use drugs.   Family History:  Her family history includes Alcohol abuse in her father; Anxiety disorder in her mother and sister; OCD in her daughter.   Allergies Allergies  Allergen Reactions   Bactrim [Sulfamethoxazole-Trimethoprim]     ACUTE RENAL fAILURE/HYPONATREMIA/HYPERKALEMIA   Macrodantin [Nitrofurantoin Macrocrystal] Rash   Medrol [Methylprednisolone]      Hallucinations   Trimethoprim     Fatigue/weakness/no appetite   Zithromax [Azithromycin]     diarrhea     Home Medications  Prior to Admission medications   Medication Sig Start Date End Date Taking? Authorizing Provider  albuterol (VENTOLIN HFA) 108 (90 Base) MCG/ACT inhaler INHALE 1-2 PUFFS INTO THE LUNGS EVERY 2 HOURS AS NEEDED FOR WHEEZING OR SHORTNESS OF BREATH. Patient taking differently: Inhale 1-2 puffs into the lungs every 2 (two) hours as needed for wheezing or shortness of breath. 01/04/22  Yes Breeback, Jade L, PA-C  AMBULATORY NON FORMULARY MEDICATION Continuous stationary and portable oxygen tanks for use with ambulation. DX: COPD with hypoxia. 07/16/18  Yes Breeback, Jade L, PA-C  AMBULATORY NON FORMULARY MEDICATION Medication Name: full face mask to use with oxygen 08/25/21  Yes Breeback, Jade L, PA-C  atenolol (TENORMIN) 25 MG tablet Take 1 tablet (25 mg total) by mouth 2 (two) times daily. Patient taking differently: Take 50 mg by mouth 2 (two) times daily. 11/19/21  Yes Breeback, Jade L, PA-C  cetirizine (ZYRTEC) 10 MG chewable tablet Chew 10 mg by mouth daily.   Yes [provider]  cholecalciferol (VITAMIN D) 25 MCG (1000 UT) tablet Take 1,000 Units by mouth daily.   Yes [provider]  clonazePAM (KLONOPIN) 0.5 MG tablet Take 1 tablet (0.5 mg total) by mouth 3 (three) times daily. 12/07/21  Yes Merian Capron, MD  dicyclomine (BENTYL) 10 MG capsule Take 10-20 mg by mouth in the morning and at bedtime.   Yes [provider]  DULERA 100-5 MCG/ACT AERO TAKE 2 PUFFS BY MOUTH TWICE A DAY Patient taking differently: Inhale 2 puffs into the lungs in the morning and at bedtime. 07/21/21  Yes Breeback, Jade L, PA-C  estradiol (ESTRACE) 0.1 MG/GM vaginal cream PLACE ONE APPLICATOR FULL THREE TIMES A WEEK AND ONE PEA SIZED AMOUNT APPLIED TO URETHRAL OPENING Patient taking differently: Place 1 Applicatorful vaginally 3 (three) times a week. 11/12/21  Yes  Breeback, Jade L, PA-C  famotidine (PEPCID) 20 MG tablet TAKE 1 TABLET BY MOUTH TWICE A DAY Patient taking differently: Take 20 mg by mouth daily as needed for heartburn or indigestion. 07/01/19  Yes Breeback, Jade L, PA-C  ferrous sulfate 325 (65 FE) MG EC tablet 1 tab PO TID every other day Patient taking differently: Take 325 mg by mouth See admin instructions. 1 t TID every other day 07/13/17  Yes Trixie Dredge, PA-C  ipratropium (ATROVENT) 0.06 %  nasal spray Place 2 sprays into both nostrils 4 (four) times daily. 12/20/21  Yes Breeback, Jade L, PA-C  ipratropium-albuterol (DUONEB) 0.5-2.5 (3) MG/3ML SOLN Take 3 mLs by nebulization every 2 (two) hours as needed (wheeze, SOB). 08/05/21  Yes Breeback, Jade L, PA-C  Lactobacillus (FLORAJEN ACIDOPHILUS) CAPS Take 1 capsule by mouth daily.   Yes [provider]  levothyroxine (SYNTHROID) 75 MCG tablet Take 1 tablet (75 mcg total) by mouth daily. 11/03/21  Yes Breeback, Jade L, PA-C  SPIRIVA RESPIMAT 2.5 MCG/ACT AERS Inhale 2 puffs into the lungs daily. Patient taking differently: Inhale 2 puffs into the lungs daily. 10/04/21  Yes Donella Stade, PA-C     Critical care time: NA    Magdalen Spatz, MSN, AGACNP-BC [place] for personal pager PCCM on call pager [phone]  01/07/2022 4:22 PM

## 2022-01-07 NOTE — Assessment & Plan Note (Addendum)
Was seen by physical therapy and Occupational Therapy.  Recommended home health PT.

## 2022-01-07 NOTE — Plan of Care (Signed)
°  Problem: Education: Goal: Knowledge of General Education information will improve Description: Including pain rating scale, medication(s)/side effects and non-pharmacologic comfort measures Outcome: Not Progressing   Problem: Clinical Measurements: Goal: Will remain free from infection Outcome: Not Progressing

## 2022-01-07 NOTE — Progress Notes (Signed)
PROGRESS NOTE  Claire [name]  TZG:[id number] DOB: [birth year] DOA: 01/06/2022 PCP: Jomarie Longs, PA-C   Brief Narrative: Claire [name] is a 71 y.o. female with medical history significant for COPD on nocturnal supplemental O2, hypertension, anxiety/PTSD/panic disorder, hypothyroidism, anemia, ongoing tobacco use who is admitted with left upper lobe masslike consolidative pneumonia and left lower lobe cavitary pneumonia .Pulmonary following.   Assessment and Plan: * Cavitary pneumonia -CT showed cavity pneumonia on left -Patient looks comfortable.  She is at baseline oxygen requirement. cont IV Unasyn,follow cultures -pulmonary consulted -Speech therapy evaluated the patient.  Suspicion for dysphagia.  Barium esophagram ordered as recommended  COPD (chronic obstructive pulmonary disease) (HCC)- (present on admission) Not in acute exacerbation.on inhalers at home.  Oxygen requirement at baseline.  Needs to follow-up with pulmonary as an outpatient  Physical debility PT/OT consultation will be obtained  Protein-calorie malnutrition, severe (HCC) Very malnourished.  Nutritionist consulted.  Tobacco use- (present on admission) Reports ongoing tobacco use.  Nicotine patch ordered.  Counseled for cessation  Hypomagnesemia- (present on admission) IV supplement given in the ED. repeat labs stable.  Anxiety- (present on admission) - Continue home Klonopin 0.5 mg 3 times daily and Atarax as needed  Anemia of chronic disease- (present on admission) Hemoglobin 7.6 on admission compared to baseline 8-10.  History of iron deficiency anemia .  No history of acute blood loss.  Will check iron studies -Continue iron supplement  Chronic use of benzodiazepine for therapeutic purpose Continue current medication  Acquired hypothyroidism- (present on admission) on Synthroid.   DVT prophylaxis:heparin injection 5,000 Units Start: 01/07/22 0600   Code Status: Partial Family Communication: Son  at bedside Patient status:Inpatient  Dispo: The patient is from: Home              Anticipated d/c is PR:[facility]               Anticipated d/c date is: Home vs SNF  Consultants: Pulmonary  Procedures:None  Antimicrobials:  Anti-infectives (From admission, onward)    Start     Dose/Rate Route Frequency Ordered Stop   01/07/22 0600  ceFEPIme (MAXIPIME) 2 g in sodium chloride 0.9 % 100 mL IVPB  Status:  Discontinued        2 g 200 mL/hr over 30 Minutes Intravenous Every 12 hours 01/06/22 1803 01/07/22 0108   01/07/22 0600  ampicillin-sulbactam (UNASYN) 1.5 g in sodium chloride 0.9 % 100 mL IVPB        1.5 g 200 mL/hr over 30 Minutes Intravenous Every 8 hours 01/07/22 0108     01/06/22 1800  ceFEPIme (MAXIPIME) 2 g in sodium chloride 0.9 % 100 mL IVPB        2 g 200 mL/hr over 30 Minutes Intravenous  Once 01/06/22 1758 01/06/22 1900   01/06/22 1800  doxycycline (VIBRAMYCIN) 100 mg in sodium chloride 0.9 % 250 mL IVPB        100 mg 125 mL/hr over 120 Minutes Intravenous  Once 01/06/22 1758 01/06/22 2017       Subjective: Patient seen and examined at the bedside this morning.  Hemodynamically stable.  On 2 L of oxygen which is her baseline.  Looks comfortable, but very malnourished, deconditioned.  Denies any worsening shortness of breath or cough  Objective: Vitals:   01/06/22 2258 01/06/22 2345 01/07/22 0527 01/07/22 1316  BP: (!) 156/86  121/72 108/70  Pulse: 84  82 82  Resp: 13 (!) 23 20 14   Temp:   98  F (36.7 C) 98.6 F (37 C)  TempSrc: Oral  Oral Oral  SpO2: 99%  98% 99%  Weight:      Height:        Intake/Output Summary (Last 24 hours) at 01/07/2022 1429 Last data filed at 01/06/2022 2054 Gross per 24 hour  Intake 346.53 ml  Output --  Net 346.53 ml   Filed Weights   01/06/22 1358  Weight: 41 kg    Examination:  General exam: Overall comfortable, not in any kind of distress, very malnourished, deconditioned HEENT: PERRL Respiratory system: Diminished air  sounds bilaterally, no wheezes or crackles  Cardiovascular system: S1 & S2 heard, RRR.  Gastrointestinal system: Abdomen is nondistended, soft and nontender. Central nervous system: Alert and oriented Extremities: No edema, no clubbing ,no cyanosis Skin: No rashes, no ulcers,no icterus     Data Reviewed: I have personally reviewed following labs and imaging studies  CBC: Recent Labs  Lab 01/06/22 1441 01/07/22 0400  WBC 14.2* 12.9*  NEUTROABS 11.8*  --   HGB 7.6* 7.2*  HCT 24.0* 22.8*  MCV 97.2 97.9  PLT 325 296   Basic Metabolic Panel: Recent Labs  Lab 01/06/22 1441 01/07/22 0400  NA 134* 131*  K 3.7 3.5  CL 88* 87*  CO2 34* 33*  GLUCOSE 120* 94  BUN 20 25*  CREATININE 1.10* 0.93  CALCIUM 8.9 8.5*  MG 1.1* 1.8     Recent Results (from the past 240 hour(s))  Resp Panel by RT-PCR (Flu A&B, Covid) Nasopharyngeal Swab     Status: None   Collection Time: 01/06/22  2:26 PM   Specimen: Nasopharyngeal Swab; Nasopharyngeal(NP) swabs in vial transport medium  Result Value Ref Range Status   SARS Coronavirus 2 by RT PCR NEGATIVE NEGATIVE Final    Comment: (NOTE) SARS-CoV-2 target nucleic acids are NOT DETECTED.  The SARS-CoV-2 RNA is generally detectable in upper respiratory specimens during the acute phase of infection. The lowest concentration of SARS-CoV-2 viral copies this assay can detect is 138 copies/mL. A negative result does not preclude SARS-Cov-2 infection and should not be used as the sole basis for treatment or other patient management decisions. A negative result may occur with  improper specimen collection/handling, submission of specimen other than nasopharyngeal swab, presence of viral mutation(s) within the areas targeted by this assay, and inadequate number of viral copies(<138 copies/mL). A negative result must be combined with clinical observations, patient history, and epidemiological information. The expected result is Negative.  Fact Sheet  for Patients:  BloggerCourse.com  Fact Sheet for Healthcare Providers:  SeriousBroker.it  This test is no t yet approved or cleared by the [place] FDA and  has been authorized for detection and/or diagnosis of SARS-CoV-2 by FDA under an Emergency Use Authorization (EUA). This EUA will remain  in effect (meaning this test can be used) for the duration of the COVID-19 declaration under Section 564(b)(1) of the Act, 21 U.S.C.section 360bbb-3(b)(1), unless the authorization is terminated  or revoked sooner.       Influenza A by PCR NEGATIVE NEGATIVE Final   Influenza B by PCR NEGATIVE NEGATIVE Final    Comment: (NOTE) The Xpert Xpress SARS-CoV-2/FLU/RSV plus assay is intended as an aid in the diagnosis of influenza from Nasopharyngeal swab specimens and should not be used as a sole basis for treatment. Nasal washings and aspirates are unacceptable for Xpert Xpress SARS-CoV-2/FLU/RSV testing.  Fact Sheet for Patients: BloggerCourse.com  Fact Sheet for Healthcare Providers: SeriousBroker.it  This test is  not yet approved or cleared by the [place] FDA and has been authorized for detection and/or diagnosis of SARS-CoV-2 by FDA under an Emergency Use Authorization (EUA). This EUA will remain in effect (meaning this test can be used) for the duration of the COVID-19 declaration under Section 564(b)(1) of the Act, 21 U.S.C. section 360bbb-3(b)(1), unless the authorization is terminated or revoked.  Performed at [facility] [place], [address] [place], [place] [address]   Culture, blood (routine x 2)     Status: None (Preliminary result)   Collection Time: 01/06/22  2:41 PM   Specimen: BLOOD  Result Value Ref Range Status   Specimen Description   Final    BLOOD Blood Culture adequate volume Performed at [facility] [place], [address] [place], [place]  [address]    Special Requests   Final    BOTTLES DRAWN AEROBIC AND ANAEROBIC BLOOD RIGHT FOREARM Performed at [facility] [place], [address] [place], [place] [address]    Culture   Final    NO GROWTH < 24 HOURS Performed at [facility], [address] [place], [place] [address]    Report Status PENDING  Incomplete  Culture, blood (routine x 2)     Status: None (Preliminary result)   Collection Time: 01/06/22  3:07 PM   Specimen: BLOOD  Result Value Ref Range Status   Specimen Description   Final    BLOOD Blood Culture adequate volume Performed at [facility] [place], [address] [place], [place] [address]    Special Requests   Final    BOTTLES DRAWN AEROBIC AND ANAEROBIC RESISTANT WRIST Performed at [facility] [place], [address] [place], [place] [address]    Culture   Final    NO GROWTH < 24 HOURS Performed at [facility], [address] [place], [place] [address]    Report Status PENDING  Incomplete     Radiology Studies: CT Head Wo Contrast  Result Date: 01/06/2022 CLINICAL DATA:  Mental status change EXAM: CT HEAD WITHOUT CONTRAST TECHNIQUE: Contiguous axial images were obtained from the base of the skull through the vertex without intravenous contrast. RADIATION DOSE REDUCTION: This exam was performed according to the departmental dose-optimization program which includes automated exposure control, adjustment of the mA and/or kV according to patient size and/or use of iterative reconstruction technique. COMPARISON:  None. FINDINGS: Brain: No evidence of acute infarction, hemorrhage, hydrocephalus, extra-axial collection or mass lesion/mass effect. Scattered area of low attenuation in the periventricular and subcortical white matter presumed mild chronic microvascular ischemic changes. Vascular: No hyperdense vessel or unexpected calcification. Skull: Normal. Negative for fracture or focal lesion. Sinuses/Orbits: No acute finding. Other:  None. IMPRESSION: No acute intracranial abnormality. Electronically Signed   By: Larose HiresImran  Ahmed D.O.   On: 01/06/2022 17:16   CT CHEST WO CONTRAST  Result Date: 01/06/2022 CLINICAL DATA:  Pneumonia, increasing confusion EXAM: CT CHEST WITHOUT CONTRAST TECHNIQUE: Multidetector CT imaging of the chest was performed following the standard protocol without IV contrast. The patient refused intravenous contrast, limiting evaluation of the mediastinum, hila, and vascular structures. RADIATION DOSE REDUCTION: This exam was performed according to the departmental dose-optimization program which includes automated exposure control, adjustment of the mA and/or kV according to patient size and/or use of iterative reconstruction technique. COMPARISON:  12/31/2021 FINDINGS: Cardiovascular: Limited unenhanced imaging of the heart and great vessels demonstrates no pericardial effusion. Normal caliber of  the thoracic aorta. Diffuse atherosclerosis of the aorta and coronary vasculature. Mediastinum/Nodes: No evidence of mediastinal adenopathy. Evaluation of hilar regions is limited by the lack of intravenous contrast. Thyroid, trachea, and esophagus are unremarkable. Lungs/Pleura: Extensive upper lobe predominant bullous emphysema. The masslike consolidation within the left upper lobe on recent chest x-ray is again identified, measuring up to 5.9 x 4.4 cm reference image 70/2. The appearance is most consistent with pneumonia. However, progress study appropriate medical management is recommended to document resolution and exclude underlying neoplasm. There are additional areas of ground-glass airspace disease scattered throughout the left upper lobe as well. Thick-walled cavity is seen within the superior segment of the left lower lobe, measuring 6.8 x 3.4 cm reference image 76/2, and extending approximately 6.0 cm in craniocaudal extent. Cylindrical area of consolidation elsewhere within the left lower lobe measuring up to 1.4 x 1.6  by 4.6 cm also contain central areas of cavitation. Differential includes cavitating pneumonia, superinfected bulla, or less likely cavitating neoplasm. Pulmonology consultation may be useful. No effusion or pneumothorax. Right chest is clear. Central airways are patent. Upper Abdomen: No acute abnormality. Musculoskeletal: No acute or destructive bony lesions. Mild anterior wedging of the midthoracic vertebral bodies is likely chronic. Reconstructed images demonstrate no additional findings. IMPRESSION: 1. Masslike consolidation within the left upper lobe, corresponding to chest x-ray finding, with an appearance most consistent with pneumonia. Followup imaging is recommended in 3-4 weeks following trial of antibiotic therapy to ensure resolution and exclude underlying malignancy. 2. Thick-walled cavity within the superior segment of the left lower lobe, with other cavitating areas of consolidation within the left lower lobe. Differential diagnosis includes cavitating pneumonia, superinfected bulla, or cavitating neoplasm. Again, progress study will be needed to assess resolution after appropriate medical management. Pulmonology consultation may be useful. 3. Aortic Atherosclerosis (ICD10-I70.0) and Emphysema (ICD10-J43.9). Electronically Signed   By: Sharlet SalinaMichael  Brown M.D.   On: 01/06/2022 17:26    Scheduled Meds:  clonazePAM  0.5 mg Oral TID   feeding supplement (KATE FARMS STANDARD 1.4)  325 mL Oral BID BM   ferrous sulfate  325 mg Oral TID WC   heparin  5,000 Units Subcutaneous Q8H   levothyroxine  75 mcg Oral Q0600   mometasone-formoterol  2 puff Inhalation BID   multivitamin with minerals  1 tablet Oral Daily   nicotine  21 mg Transdermal Daily   Tiotropium Bromide Monohydrate  2 puff Inhalation Daily   Continuous Infusions:  sodium chloride 10 mL/hr at 01/06/22 1825   ampicillin-sulbactam (UNASYN) IV 1.5 g (01/07/22 0626)     LOS: 1 day   Burnadette PopAmrit Marigene Erler, MD [facility] P2/02/2022,  2:29 PM

## 2022-01-07 NOTE — Assessment & Plan Note (Addendum)
-  Continue Klonopin °

## 2022-01-07 NOTE — Evaluation (Signed)
Physical Therapy Evaluation Patient Details Name: ALAISHA EVERSLEY MRN: [id number] DOB: [birth year] Today's Date: 01/07/2022  History of Present Illness  Claire [name] is a 71 y.o. female presents with worsening SOB. Head CT negative. Pt admitted with cavitary pneumonia. PMH: COPD on nocturnal supplemental O2, HTN, anxiety/PTSD/panic disorder, hypothyroidism, anemia, ongoing tobacco   Clinical Impression  Pt admitted with above diagnosis. Pt from home alone, taking seated rest breaks as needed to recover, daughter comes over to complete household chores, using 3L O2 PRN. Pt reports anxiousness with mobility at baseline, questions if that makes her SOB causing more frequent seated rest breaks. Pt requiring min guard with limited steps at EOB, requires encouragement and education to mobilize with therapist, using IV pole to steady self. Pt on 3L O2 with SpO2 94-98%, dyspnea 2/4. Educated pt on time OOB and ambulating to restroom as needed with nursing to get steps in; pt declines sitting in recliner despite encouragement. Recommend HHPT with family support at home and pt and son in agreement. Pt currently with functional limitations due to the deficits listed below (see PT Problem List). Pt will benefit from skilled PT to increase their independence and safety with mobility to allow discharge to the venue listed below.          Recommendations for follow up therapy are one component of a multi-disciplinary discharge planning process, led by the attending physician.  Recommendations may be updated based on patient status, additional functional criteria and insurance authorization.  Follow Up Recommendations Home health PT    Assistance Recommended at Discharge Intermittent Supervision/Assistance  Patient can return home with the following  A little help with walking and/or transfers;A little help with bathing/dressing/bathroom;Assistance with cooking/housework;Assist for transportation;Help with stairs or  ramp for entrance    Equipment Recommendations None recommended by PT  Recommendations for Other Services       Functional Status Assessment       Precautions / Restrictions Precautions Precautions: Fall Restrictions Weight Bearing Restrictions: No      Mobility  Bed Mobility Overal bed mobility: Needs Assistance Bed Mobility: Supine to Sit, Sit to Supine  Supine to sit: Supervision Sit to supine: Supervision  General bed mobility comments: slow, labored movement, supv for safety    Transfers Overall transfer level: Needs assistance Equipment used: 1 person hand held assist Transfers: Sit to/from Stand Sit to Stand: Min guard  General transfer comment: BLE braced against bed, slow to power to stand, weight posterior requiring VC for flat foot posture    Ambulation/Gait Ambulation/Gait assistance: Min guard Gait Distance (Feet): 10 Feet Assistive device: IV Pole Gait Pattern/deviations: Step-to pattern, Decreased stride length, Narrow base of support Gait velocity: decreased  General Gait Details: pt ambulates at bedside taking steps forward and backward, slightly unsteady without LOB, hesitant to ambulate but agreeable to short distance with encouragement, narrow BOS with slow, short steps  Stairs            Wheelchair Mobility    Modified Rankin (Stroke Patients Only)       Balance Overall balance assessment: Needs assistance Sitting-balance support: Feet supported Sitting balance-Leahy Scale: Good  Standing balance support: During functional activity, Single extremity supported, Reliant on assistive device for balance Standing balance-Leahy Scale: Poor Standing balance comment: static standing with BLE braced against bed, dynamic with IV pole or HHA         Pertinent Vitals/Pain      Home Living Family/patient expects to be discharged to:: Private residence Living  Arrangements: Alone Available Help at Discharge: Family;Available  PRN/intermittently Type of Home: Apartment Home Access: Level entry  Home Layout: One level Home Equipment: Other (comment);Rolling Walker (2 wheels);Grab bars - toilet;Grab bars - tub/shower (3L O2) Additional Comments: pt reports using 3L O2 as needed    Prior Function Prior Level of Function : Independent/Modified Independent  Mobility Comments: pt reports ind with household ambulation without AD, denies falls ADLs Comments: pt reports ind with self care tasks, daughter completes household chores     Hand Dominance        Extremity/Trunk Assessment   Upper Extremity Assessment Upper Extremity Assessment: Defer to OT evaluation    Lower Extremity Assessment Lower Extremity Assessment: Generalized weakness (AROM WNL, strength 3+/5, denies numbness/tingling, symmetrical)    Cervical / Trunk Assessment Cervical / Trunk Assessment: Kyphotic  Communication   Communication: No difficulties  Cognition Arousal/Alertness: Awake/alert Behavior During Therapy: WFL for tasks assessed/performed, Anxious Overall Cognitive Status: Within Functional Limits for tasks assessed  General Comments: Pt verbalizes anxiousness about mobilizing, somewhat responsive to encouragement and education        General Comments General comments (skin integrity, edema, etc.): Pt on 3L O2, SpO2 98% at rest and 94% with in room ambulation, dyspnea 2/4    Exercises     Assessment/Plan    PT Assessment Patient needs continued PT services  PT Problem List Decreased strength;Decreased activity tolerance;Decreased balance;Decreased knowledge of use of DME;Cardiopulmonary status limiting activity       PT Treatment Interventions DME instruction;Gait training;Functional mobility training;Therapeutic activities;Therapeutic exercise;Balance training;Patient/family education    PT Goals (Current goals can be found in the Care Plan section)  Acute Rehab PT Goals Patient Stated Goal: home with HHPT and family  to assist PT Goal Formulation: With patient/family Time For Goal Achievement: 01/21/22 Potential to Achieve Goals: Good    Frequency Min 3X/week     Co-evaluation               AM-PAC PT "6 Clicks" Mobility  Outcome Measure Help needed turning from your back to your side while in a flat bed without using bedrails?: A Little Help needed moving from lying on your back to sitting on the side of a flat bed without using bedrails?: A Little Help needed moving to and from a bed to a chair (including a wheelchair)?: A Little Help needed standing up from a chair using your arms (e.g., wheelchair or bedside chair)?: A Little Help needed to walk in hospital room?: A Lot Help needed climbing 3-5 steps with a railing? : A Lot 6 Click Score: 16    End of Session Equipment Utilized During Treatment: Oxygen Activity Tolerance: Patient tolerated treatment well;Other (comment) (limited by anxiousness) Patient left: in bed;with call bell/phone within reach;with bed alarm set;with family/visitor present Nurse Communication: Mobility status;Other (comment) (SPO2) PT Visit Diagnosis: Unsteadiness on feet (R26.81);Other abnormalities of gait and mobility (R26.89);Muscle weakness (generalized) (M62.81)    Time: 0347-4259 PT Time Calculation (min) (ACUTE ONLY): 26 min   Charges:   PT Evaluation $PT Eval Low Complexity: 1 Low PT Treatments $Therapeutic Activity: 8-22 mins         Tori Breckyn Ticas PT, DPT 01/07/22, 2:58 PM

## 2022-01-07 NOTE — Assessment & Plan Note (Addendum)
Latest hemoglobin of 7.9.  Baseline around 8-10.  History of iron deficiency anemia.  No history of acute blood loss.  Continue iron supplements.

## 2022-01-07 NOTE — Assessment & Plan Note (Addendum)
Continue nicotine patch  °

## 2022-01-07 NOTE — Assessment & Plan Note (Addendum)
Continue Brovana and Dulera.  Not in acute exacerbation we will continue inhalers.  Will need to follow-up with pulmonary as outpatient.  Baseline oxygen requirement at this time

## 2022-01-07 NOTE — Plan of Care (Signed)
  Problem: Education: Goal: Knowledge of General Education information will improve Description: Including pain rating scale, medication(s)/side effects and non-pharmacologic comfort measures Outcome: Progressing   Problem: Health Behavior/Discharge Planning: Goal: Ability to manage health-related needs will improve Outcome: Progressing   Problem: Clinical Measurements: Goal: Ability to maintain clinical measurements within normal limits will improve Outcome: Progressing Goal: Will remain free from infection Outcome: Progressing Goal: Diagnostic test results will improve Outcome: Progressing Goal: Respiratory complications will improve Outcome: Progressing Goal: Cardiovascular complication will be avoided Outcome: Progressing   Problem: Activity: Goal: Risk for activity intolerance will decrease Outcome: Progressing   Problem: Nutrition: Goal: Adequate nutrition will be maintained Outcome: Progressing   Problem: Coping: Goal: Level of anxiety will decrease Outcome: Progressing   Problem: Elimination: Goal: Will not experience complications related to bowel motility Outcome: Progressing Goal: Will not experience complications related to urinary retention Outcome: Progressing   Problem: Pain Managment: Goal: General experience of comfort will improve Outcome: Progressing   Problem: Safety: Goal: Ability to remain free from injury will improve Outcome: Progressing   Problem: Skin Integrity: Goal: Risk for impaired skin integrity will decrease Outcome: Progressing   Problem: Clinical Measurements: Goal: Ability to maintain a body temperature in the normal range will improve Outcome: Progressing   Problem: Respiratory: Goal: Ability to maintain adequate ventilation will improve Outcome: Progressing Goal: Ability to maintain a clear airway will improve Outcome: Progressing   

## 2022-01-07 NOTE — Assessment & Plan Note (Addendum)
Continue Klonopin and hydroxyzine.

## 2022-01-07 NOTE — TOC Initial Note (Signed)
Transition of Care [facility]) - Initial/Assessment Note    Patient Details  Name: Claire [name] MRN: [id number] Date of Birth: [birth year]  Transition of Care [facility]) CM/SW Contact:    Leeroy Cha, RN Phone Number: 01/07/2022, 9:03 AM  Clinical Narrative:                  Transition of Care [facility]) Screening Note   Patient Details  Name: Claire [name] Date of Birth: October 26, 1951   Transition of Care [facility]) CM/SW Contact:    Leeroy Cha, RN Phone Number: 01/07/2022, 9:03 AM    Transition of Care Department [facility]) has reviewed patient and no TOC needs have been identified at this time. We will continue to monitor patient advancement through interdisciplinary progression rounds. If new patient transition needs arise, please place a TOC consult.    Expected Discharge Plan: Home/Self Care Barriers to Discharge: Continued Medical Work up   Patient Goals and CMS Choice Patient states their goals for this hospitalization and ongoing recovery are:: unable to state at this time CMS Medicare.gov Compare Post Acute Care list provided to:: Patient Represenative (must comment) (daughter) Choice offered to / list presented to : Adult Children  Expected Discharge Plan and Services Expected Discharge Plan: Home/Self Care   Discharge Planning Services: CM Consult   Living arrangements for the past 2 months: Single Family Home                                      Prior Living Arrangements/Services Living arrangements for the past 2 months: Single Family Home Lives with:: Self Patient language and need for interpreter reviewed:: Yes              Criminal Activity/Legal Involvement Pertinent to Current Situation/Hospitalization: No - Comment as needed  Activities of Daily Living Home Assistive Devices/Equipment: Oxygen ADL Screening (condition at time of admission) Patient's cognitive ability adequate to safely complete daily activities?: No Is the patient deaf or have  difficulty hearing?: No Does the patient have difficulty seeing, even when wearing glasses/contacts?: No Does the patient have difficulty concentrating, remembering, or making decisions?: Yes Patient able to express need for assistance with ADLs?: Yes Does the patient have difficulty dressing or bathing?: Yes Independently performs ADLs?: No Communication: Independent Dressing (OT): Needs assistance Is this a change from baseline?: Change from baseline, expected to last >3 days Grooming: Needs assistance Is this a change from baseline?: Change from baseline, expected to last >3 days Feeding: Independent Bathing: Needs assistance Is this a change from baseline?: Change from baseline, expected to last >3 days Toileting: Needs assistance Is this a change from baseline?: Change from baseline, expected to last >3days In/Out Bed: Needs assistance Is this a change from baseline?: Change from baseline, expected to last >3 days Walks in Home: Needs assistance Is this a change from baseline?: Change from baseline, expected to last >3 days Does the patient have difficulty walking or climbing stairs?: No Weakness of Legs: None Weakness of Arms/Hands: None  Permission Sought/Granted                  Emotional Assessment Appearance:: Appears stated age Attitude/Demeanor/Rapport: Combative Affect (typically observed): Constricted Orientation: : Oriented to Self Alcohol / Substance Use: Not Applicable Psych Involvement: No (comment)  Admission diagnosis:  Cavitary pneumonia [J18.9, J98.4] Patient Active Problem List   Diagnosis Date Noted   COPD (chronic obstructive  pulmonary disease) ([place]) 01/07/2022   Hypomagnesemia 01/07/2022   Tobacco use 01/07/2022   Cavitary pneumonia 01/06/2022   Community acquired pneumonia of left upper lobe of lung 01/03/2022   Severe malnutrition ([place]) 01/03/2022   AKI (acute kidney injury) ([place]) 01/03/2022   Weakness 12/31/2021   Current smoker  11/03/2021   Stress due to family tension 09/02/2021   Nasal congestion 08/06/2021   History of UTI 06/02/2021   Acute cystitis with hematuria 04/26/2021   Cloudy urine 04/23/2021   Stage 3b chronic kidney disease ([place]) 04/23/2021   Medication management 03/15/2021   History of Clostridioides difficile colitis 02/22/2021   Underweight 01/22/2021   Mild protein-calorie malnutrition ([place]) 01/19/2021   Clostridium difficile colitis 09/11/2020   Lower extremity edema 07/29/2020   Constipation 07/13/2020   Body mass index (BMI) of 19 or less in adult 05/08/2020   Malnutrition of mild degree (HCC) 05/06/2020   Anxiety 03/23/2020   Pernicious anemia 03/23/2020   Macrocytic anemia 03/10/2020   Elevated ferritin 03/10/2020   Irritable bowel syndrome with diarrhea 03/10/2020   Unintended weight loss 02/19/2020   Chronic rhinitis 01/31/2020   Chronic respiratory failure with hypoxia ([place]) 01/31/2020   Vaccine counseling 12/24/2019   Menopausal vaginal dryness 12/24/2019   History of hypokalemia 10/09/2019   Leg cramps 05/29/2019   No energy 05/29/2019   Recurrent UTI 05/07/2019   DOE (dyspnea on exertion) 05/09/2018   High serum parathyroid hormone (PTH) 07/19/2017   Normocytic anemia 07/19/2017   Low serum vitamin B12 05/14/2017   Vitamin D deficiency 05/14/2017   Depressed mood 05/14/2017   Anemia of chronic disease 05/11/2017   Stage 3a chronic kidney disease ([place]) 01/16/2017   Iron deficiency anemia 01/16/2017   Centrilobular emphysema ([place]) 12/20/2016   Hyponatremia 12/19/2016   Hyperkalemia 12/19/2016   Chronic use of benzodiazepine for therapeutic purpose 08/11/2015   Acquired hypothyroidism 11/24/2014   BP (high blood pressure) 07/24/2012   PTSD (post-traumatic stress disorder) 11/10/2011    Class: Question of   Neurosis, anxiety, panic type 11/10/2011    Class: Chronic   PCP:  Donella Stade, PA-C Pharmacy:   [place], [place] - [address] [place] [address] Phone: [phone] Fax: [phone]     Social Determinants of Health (SDOH) Interventions    Readmission Risk Interventions No flowsheet data found.

## 2022-01-07 NOTE — Assessment & Plan Note (Addendum)
Continue Synthroid °

## 2022-01-07 NOTE — Assessment & Plan Note (Addendum)
Improved

## 2022-01-08 LAB — CBC WITH DIFFERENTIAL/PLATELET
Abs Immature Granulocytes: 0.1 10*3/uL — ABNORMAL HIGH (ref 0.00–0.07)
Basophils Absolute: 0.1 10*3/uL (ref 0.0–0.1)
Basophils Relative: 1 %
Eosinophils Absolute: 0.5 10*3/uL (ref 0.0–0.5)
Eosinophils Relative: 5 %
HCT: 25.6 % — ABNORMAL LOW (ref 36.0–46.0)
Hemoglobin: 7.9 g/dL — ABNORMAL LOW (ref 12.0–15.0)
Immature Granulocytes: 1 %
Lymphocytes Relative: 9 %
Lymphs Abs: 1 10*3/uL (ref 0.7–4.0)
MCH: 31 pg (ref 26.0–34.0)
MCHC: 30.9 g/dL (ref 30.0–36.0)
MCV: 100.4 fL — ABNORMAL HIGH (ref 80.0–100.0)
Monocytes Absolute: 1.2 10*3/uL — ABNORMAL HIGH (ref 0.1–1.0)
Monocytes Relative: 12 %
Neutro Abs: 7.8 10*3/uL — ABNORMAL HIGH (ref 1.7–7.7)
Neutrophils Relative %: 72 %
Platelets: 339 10*3/uL (ref 150–400)
RBC: 2.55 MIL/uL — ABNORMAL LOW (ref 3.87–5.11)
RDW: 13.3 % (ref 11.5–15.5)
WBC: 10.7 10*3/uL — ABNORMAL HIGH (ref 4.0–10.5)
nRBC: 0 % (ref 0.0–0.2)

## 2022-01-08 MED ORDER — BUDESONIDE 0.25 MG/2ML IN SUSP
0.2500 mg | Freq: Two times a day (BID) | RESPIRATORY_TRACT | Status: DC
  Administered 2022-01-09 – 2022-01-10 (×3): 0.25 mg via RESPIRATORY_TRACT
  Filled 2022-01-08 (×4): qty 2

## 2022-01-08 MED ORDER — ARFORMOTEROL TARTRATE 15 MCG/2ML IN NEBU
15.0000 ug | INHALATION_SOLUTION | Freq: Two times a day (BID) | RESPIRATORY_TRACT | Status: DC
  Administered 2022-01-09 – 2022-01-10 (×3): 15 ug via RESPIRATORY_TRACT
  Filled 2022-01-08 (×4): qty 2

## 2022-01-08 MED ORDER — LORAZEPAM 2 MG/ML IJ SOLN: 1.0000 mg | INTRAMUSCULAR | Status: DC | PRN

## 2022-01-08 MED ORDER — HALOPERIDOL LACTATE 5 MG/ML IJ SOLN: 1.0000 mg | Freq: Four times a day (QID) | INTRAMUSCULAR | Status: DC | PRN

## 2022-01-08 MED ORDER — DICYCLOMINE HCL 10 MG PO CAPS
10.0000 mg | ORAL_CAPSULE | Freq: Three times a day (TID) | ORAL | Status: DC | PRN
  Administered 2022-01-09 – 2022-01-10 (×3): 10 mg via ORAL
  Filled 2022-01-08 (×3): qty 1

## 2022-01-08 MED ORDER — VITAMIN D3 25 MCG (1000 UNIT) PO TABS
1000.0000 [IU] | ORAL_TABLET | Freq: Every day | ORAL | Status: DC
  Administered 2022-01-08 – 2022-01-10 (×3): 1000 [IU] via ORAL
  Filled 2022-01-08 (×3): qty 1

## 2022-01-08 MED ORDER — HALOPERIDOL 1 MG PO TABS
1.0000 mg | ORAL_TABLET | Freq: Four times a day (QID) | ORAL | Status: DC | PRN
  Filled 2022-01-08: qty 1

## 2022-01-08 MED ORDER — REVEFENACIN 175 MCG/3ML IN SOLN
175.0000 ug | Freq: Every day | RESPIRATORY_TRACT | Status: DC
  Administered 2022-01-09 – 2022-01-10 (×2): 175 ug via RESPIRATORY_TRACT
  Filled 2022-01-08 (×3): qty 3

## 2022-01-08 MED ORDER — ATENOLOL 50 MG PO TABS
50.0000 mg | ORAL_TABLET | Freq: Every day | ORAL | Status: DC
  Administered 2022-01-08 – 2022-01-10 (×3): 50 mg via ORAL
  Filled 2022-01-08 (×3): qty 1

## 2022-01-08 MED ORDER — RISAQUAD PO CAPS
1.0000 | ORAL_CAPSULE | Freq: Every day | ORAL | Status: DC
  Administered 2022-01-08 – 2022-01-10 (×3): 1 via ORAL
  Filled 2022-01-08 (×3): qty 1

## 2022-01-08 NOTE — Progress Notes (Signed)
°  Progress Note   Patient: Claire [name] MIW:[id number] DOB: 1951/08/30 DOA: 01/06/2022     2 DOS: the patient was seen and examined on 01/08/2022   Brief hospital course: VIKKI GAINS is a 71 y.o. female with medical history significant for COPD on nocturnal supplemental O2, hypertension, anxiety/PTSD/panic disorder, hypothyroidism, anemia, ongoing tobacco use who is admitted with left upper lobe masslike consolidative pneumonia and left lower lobe cavitary pneumonia .Pulmonary following.  Assessment and Plan: * Cavitary pneumonia -CT showed multiple cavity pneumonia on left -She is at baseline oxygen requirement. cont IV Unasyn. -Patient does have history of extensive emphysema and COPD, she is on 2 to 3 L of oxygen at home.  Also reported history of 20 pound weight loss. -No history of exposure to tuberculosis. -Blood cultures, sputum cultures, Legionella, streptococcal. -Sputum AFB, induced sputum.  No clinical suspicion for tuberculosis. -pulmonary following. -Speech therapy following. Differential diagnosis includes underlying malignancy, however currently conservative treatment advised with prolonged antibiotics and follow-up CT scan plans by pulmonary.  Physical debility PT/OT consultation will be obtained  Protein-calorie malnutrition, severe (HCC) Very malnourished.  Nutritionist consulted.  Tobacco use- (present on admission) Reports ongoing tobacco use.  Nicotine patch ordered.  Counseled for cessation  Hypomagnesemia- (present on admission) IV supplement given in the ED. repeat labs stable.  COPD (chronic obstructive pulmonary disease) (HCC)- (present on admission) Not in acute exacerbation.on inhalers at home.  Oxygen requirement at baseline.  Needs to follow-up with pulmonary as an outpatient  Anxiety- (present on admission) - Continue home Klonopin 0.5 mg 3 times daily and Atarax as needed  Anemia of chronic disease- (present on admission) Hemoglobin 7.6 on  admission compared to baseline 8-10.  History of iron deficiency anemia .  No history of acute blood loss.  Will check iron studies -Continue iron supplement  Chronic use of benzodiazepine for therapeutic purpose Continue current medication  Acquired hypothyroidism- (present on admission) on Synthroid.        Subjective: Patient seen and examined.  Patient herself denies any complaints.  Daughter at the bedside.  Daughter was more worried about her episode of hallucination while she was in the EMS truck.  No other overnight events.  Hardly coughing.  Mostly dry cough.  Does not have good appetite. Multiple questions answered.   Physical Exam: Vitals:   01/07/22 2010 01/07/22 2035 01/08/22 0827 01/08/22 0900  BP:  (!) 150/73 118/66   Pulse:  88 80   Resp:  20 20   Temp:  98.2 F (36.8 C) 98.4 F (36.9 C)   TempSrc:  Oral Oral   SpO2: 97% 95% 96% 96%  Weight:      Height:       General: Chronically sick looking.  Cachectic.  Comfortable at 2 L oxygen at rest. Cardiovascular: S1-S2 normal.  Regular rate rhythm. Respiratory: Mostly clear.  Some conducted airway sounds on the left side. Gastrointestinal: Soft.  Nontender.  Bowel sound present. Ext: Cachectic extremities.  No edema.  No swelling or cyanosis. Neuro: Alert and oriented x4.  No focal deficits.   Data Reviewed:  Lab test electrolytes reviewed.  Updated patient.  Family Communication: Daughter at the bedside.  Disposition: Status is: Inpatient Remains inpatient appropriate because: IV antibiotics.          Planned Discharge Destination: Home with Home Health     Time spent: 35 minutes  Author: Dorcas Carrow, MD 01/08/2022 11:58 AM  For on call review [email or web address].

## 2022-01-08 NOTE — Evaluation (Signed)
Occupational Therapy Evaluation Patient Details Name: Claire [name] MRN: [id number] DOB: [birth year] Today's Date: 01/08/2022   History of Present Illness Claire [name] is a 71 y.o. female presents with worsening SOB. Head CT negative. Pt admitted with cavitary pneumonia. PMH: COPD on nocturnal supplemental O2, HTN, anxiety/PTSD/panic disorder, hypothyroidism, anemia, ongoing tobacco   Clinical Impression   Patient is a 71 year old female who was admitted for above. Patient was living at home with daughter support with independence in ADLs on 3L/min of O2. Currently, patient is min A for ADLs with increased anxiousness with leaving bedside. Patient was noted to have decreased activity tolerance, decreased endurance, increased anxiousness with movement, decreased standing balance, decreased strength and decreased endurance impacting participation in ADLs.  Patient would continue to benefit from skilled OT services at this time while admitted  to address noted deficits in order to improve overall safety and independence in ADLs.       Recommendations for follow up therapy are one component of a multi-disciplinary discharge planning process, led by the attending physician.  Recommendations may be updated based on patient status, additional functional criteria and insurance authorization.   Follow Up Recommendations  No OT follow up    Assistance Recommended at Discharge Frequent or constant Supervision/Assistance  Patient can return home with the following A little help with walking and/or transfers;A little help with bathing/dressing/bathroom;Direct supervision/assist for financial management;Assistance with cooking/housework;Assist for transportation;Help with stairs or ramp for entrance;Direct supervision/assist for medications management    Functional Status Assessment  Patient has had a recent decline in their functional status and demonstrates the ability to make significant improvements in  function in a reasonable and predictable amount of time.  Equipment Recommendations  None recommended by OT    Recommendations for Other Services       Precautions / Restrictions Precautions Precautions: Fall Precaution Comments: monitor O2 on 3L/min at home Restrictions Weight Bearing Restrictions: No      Mobility Bed Mobility Overal bed mobility: Needs Assistance Bed Mobility: Supine to Sit, Sit to Supine     Supine to sit: Supervision Sit to supine: Supervision   General bed mobility comments: slow movements with increased time. SUP for safety.    Transfers                          Balance Overall balance assessment: Needs assistance Sitting-balance support: Feet supported Sitting balance-Leahy Scale: Good     Standing balance support: During functional activity, Single extremity supported, Reliant on assistive device for balance Standing balance-Leahy Scale: Poor Standing balance comment: static standing noted to have BLE leaning against bed                           ADL either performed or assessed with clinical judgement   ADL Overall ADL's : Needs assistance/impaired Eating/Feeding: Set up;Sitting   Grooming: Wash/dry face;Wash/dry hands;Sitting;Set up Grooming Details (indicate cue type and reason): declined to attempt at sink Upper Body Bathing: Set up;Sitting patient was educated on proper positioning of nasal cannula in nose. Patient verbalized understanding but unable to orient nasal cannula to proper orientation. Needed max A to complete.    Lower Body Bathing: Minimal assistance;Sit to/from stand;Sitting/lateral leans   Upper Body Dressing : Set up;Sitting   Lower Body Dressing: Min guard;Sit to/from stand;Sitting/lateral leans patient was able to don/doff socks EOB with SUP.    Toilet Transfer: Minimal Research officer, political party  Transfer Details (indicate cue type and reason): patient declined to transfer from bed to any surface in  room. patient was noted to have slight LOB posteriorly upon intial sit to stand. patient was able to regain standing balance with min A. patient was able to take side steps to foot [facility] with min guard. Toileting- ArchitectClothing Manipulation and Hygiene: Min guard;Sit to/from stand;Sitting/lateral lean       Functional mobility during ADLs: Minimal assistance General ADL Comments: with no AD patient declined to complete bathing tasks and various other offered activities with various reasons. Patient was educated on MD request for OT eval to assess needs for home. Patient verbalized understanding with increased time and encouragement was able to participate in ADLs.      Vision Baseline Vision/History: 1 Wears glasses Patient Visual Report: No change from baseline       Perception     Praxis      Pertinent Vitals/Pain Pain Assessment Pain Assessment: No/denies pain     Hand Dominance     Extremity/Trunk Assessment Upper Extremity Assessment Upper Extremity Assessment: Overall WFL for tasks assessed   Lower Extremity Assessment Lower Extremity Assessment: Defer to PT evaluation   Cervical / Trunk Assessment Cervical / Trunk Assessment: Kyphotic   Communication Communication Communication: No difficulties   Cognition Arousal/Alertness: Awake/alert Behavior During Therapy: WFL for tasks assessed/performed, Anxious Overall Cognitive Status: Within Functional Limits for tasks assessed                                 General Comments: 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 patient was anxious about moving out of bed with various reasons provided to avoid movement. was able to respond to encouragement/eduaction.     General Comments       Exercises     Shoulder Instructions      Home Living Family/patient expects to be discharged to:: Private residence Living Arrangements: Alone Available Help at Discharge: Family;Available  PRN/intermittently Type of Home: Apartment Home Access: Level entry     Home Layout: One level     Bathroom Shower/Tub: Tub/shower unit         Home Equipment: Other (comment);Rolling Walker (2 wheels);Grab bars - toilet;Grab bars - tub/shower   Additional Comments: pt reports using 3L O2 as needed      Prior Functioning/Environment Prior Level of Function : Independent/Modified Independent             Mobility Comments: pt reports ind with household ambulation without AD, denies falls ADLs Comments: pt reports ind with self care tasks, daughter completes household chores        OT Problem List: Decreased activity tolerance;Impaired balance (sitting and/or standing);Decreased safety awareness;Cardiopulmonary status limiting activity;Decreased knowledge of precautions;Decreased knowledge of use of DME or AE      OT Treatment/Interventions: Self-care/ADL training;Therapeutic exercise;Neuromuscular education;Energy conservation;DME and/or AE instruction;Therapeutic activities;Balance training;Patient/family education    OT Goals(Current goals can be found in the care plan section) Acute Rehab OT Goals Patient Stated Goal: to get back home OT Goal Formulation: With patient Time For Goal Achievement: 01/22/22 Potential to Achieve Goals: Good  OT Frequency: Min 2X/week    Co-evaluation              AM-PAC OT "6 Clicks" Daily Activity     Outcome Measure Help from another person eating meals?: None Help from another person taking care  of personal grooming?: A Little Help from another person toileting, which includes using toliet, bedpan, or urinal?: A Little Help from another person bathing (including washing, rinsing, drying)?: A Little Help from another person to put on and taking off regular upper body clothing?: A Little Help from another person to put on and taking off regular lower body clothing?: A Little 6 Click Score: 19   End of Session Equipment  Utilized During Treatment: Gait belt Nurse Communication: Mobility status  Activity Tolerance: Patient tolerated treatment well Patient left: in bed;with call bell/phone within reach  OT Visit Diagnosis: Unsteadiness on feet (R26.81)                Time: 1100-1143 OT Time Calculation (min): 43 min Charges:  OT General Charges $OT Visit: 1 Visit OT Evaluation $OT Eval Low Complexity: 1 Low OT Treatments $Self Care/Home Management : 23-37 mins  Sharyn Blitz OTR/L, MS Acute Rehabilitation Department Office# [phone] Pager# [phone]   Ardyth Harps 01/08/2022, 12:30 PM

## 2022-01-08 NOTE — Progress Notes (Signed)
NAME:  Claire [name], MRN:  [id number], DOB:  1951/01/16, LOS: 2 ADMISSION DATE:  01/06/2022, CONSULTATION DATE:  01/07/22 REFERRING MD:  Allena Katz CHIEF COMPLAINT:  Cavitary Pneumonia   History of Present Illness:  71 year old female active smoker with emphysema, chronic hypoxemic respiratory failure, HTN admitted after failing outpatient treatment for pneumonia with symptoms of shortness of breath and productive cough for >7 days. Non-adherent to oxygen at home. Denies fevers, night sweats. Recent 19lb weight loss in the last 3 months. Poor appetite. Son at bedside providing additional history. Patient poor historian but states +history of TB exposure >10-20 years ago, no treatment.  Pertinent  Medical History   Past Medical History:  Diagnosis Date   Acid reflux    Anxiety    High blood pressure    High serum parathyroid hormone (PTH) 07/19/2017   96 06/28/2014, 162 12/29/16   Hypothyroid    PTSD (post-traumatic stress disorder)     Significant Hospital Events: Including procedures, antibiotic start and stop dates in addition to other pertinent events   2/2 admitted to [facility], failed outpatient doxycycline 2/3 PCCM consulted  CT Chest  CT Chest 01/06/22 reviewed with severe bullous emphysema with upper lobe predominance, LUL consolidation with ?cavitation vs bullae and multiple thick walled cavities in LLL including in superior segment measuring 6.8 x 3.4 and 1.4x1.6x4.6 cm.   Interim History / Subjective:   No acute events overnight.   Patient is feeling ok. She is trying to eat more today and her stomach is a bit upset.  Patient's daughter is at bedside. All questions answered.  Objective   Blood pressure 118/66, pulse 80, temperature 98.4 F (36.9 C), temperature source Oral, resp. rate 20, height 5' (1.524 m), weight 41 kg, SpO2 96 %.    FiO2 (%):  [32 %] 32 %   Intake/Output Summary (Last 24 hours) at 01/08/2022 1037 Last data filed at 01/08/2022 0516 Gross per 24 hour  Intake  389.3 ml  Output --  Net 389.3 ml   Filed Weights   01/06/22 1358  Weight: 41 kg    Examination: General: elderly woman, sitting up in bed, no acute distress HENT: [place]/AT, moist mucous membranes, sclera anicteric Lungs: diminished breath sounds Cardiovascular: rrr, s1s2, no murmurs Abdomen: soft, non-tender, non-distended, BS+ Extremities: warm, no edema Neuro: alert, moving all extremities  Resolved Hospital Problem list     Assessment & Plan:  Acute on chronic hypoxemic respiratory failure Cavitary lung lesions concerning for pneumonia vs neoplasm Recent 19 lb weight loss. CT Chest 01/06/22 reviewed with severe bullous emphysema with upper lobe predominance, LUL consolidation with ?cavitation vs bullae and multiple thick walled cavities in LLL including in superior segment measuring 6.8 x 3.4 and 1.4x1.6x4.6 cm. Associated ground glass airspace opacities.  --Wean supplemental O2 for goal SpO2 >88% --Recommend AFB sputum cultures. Sputum induction ordered --F/u quantiferon gold --Sputum culture pending - check urine legionella and strep pneumoniae antigens --Continue IV unasyn. Will need 3-4 weeks of treatment with PO Augmentin with plan for repeat CT without contrast. If cavitary lesions persistent, will warrant bronchoscopy to rule out possible malignancy. Will arrange outpatient follow up when patient closer to discharge   Bullous emphysema Active tobacco abuse -- switch to nebulizer treatments while inpatient with budesonide, brovana and yupelri -- at time of discharge patient is to use Dulera 200-5 mcg TWO puffs TWICE a day and Spiriva Respimat 2.6 mcg TWO puffs ONCE a day --Currently on nicotine patch   Pulmonary will  continue to follow  Best Practice (right click and "Reselect all SmartList Selections" daily)   Per primary  Labs   CBC: Recent Labs  Lab 01/06/22 1441 01/07/22 0400 01/08/22 1003  WBC 14.2* 12.9* 10.7*  NEUTROABS 11.8*  --  7.8*  HGB 7.6* 7.2*  7.9*  HCT 24.0* 22.8* 25.6*  MCV 97.2 97.9 100.4*  PLT 325 296 339    Basic Metabolic Panel: Recent Labs  Lab 01/06/22 1441 01/07/22 0400  NA 134* 131*  K 3.7 3.5  CL 88* 87*  CO2 34* 33*  GLUCOSE 120* 94  BUN 20 25*  CREATININE 1.10* 0.93  CALCIUM 8.9 8.5*  MG 1.1* 1.8   GFR: Estimated Creatinine Clearance: 35.9 mL/min (by C-G formula based on SCr of 0.93 mg/dL). Recent Labs  Lab 01/06/22 1441 01/06/22 2327 01/07/22 0400 01/08/22 1003  PROCALCITON  --   --  0.55  --   WBC 14.2*  --  12.9* 10.7*  LATICACIDVEN 1.9 1.2  --   --     Liver Function Tests: Recent Labs  Lab 01/06/22 1441  AST 38  ALT 19  ALKPHOS 180*  BILITOT 0.9  PROT 7.2  ALBUMIN 2.8*   No results for input(s): LIPASE, AMYLASE in the last 168 hours. No results for input(s): AMMONIA in the last 168 hours.  ABG No results found for: PHART, PCO2ART, PO2ART, HCO3, TCO2, ACIDBASEDEF, O2SAT   Coagulation Profile: Recent Labs  Lab 01/06/22 1441  INR 1.2    Cardiac Enzymes: No results for input(s): CKTOTAL, CKMB, CKMBINDEX, TROPONINI in the last 168 hours.  HbA1C: No results found for: HGBA1C  CBG: No results for input(s): GLUCAP in the last 168 hours.  Critical care time: n/a    Melody Comas, MD [facility] Office: [phone]   See Amion for personal pager PCCM on call pager [phone] until 7pm. Please call Elink 7p-7a. [phone]

## 2022-01-08 NOTE — Progress Notes (Signed)
Sputum obtained by spontaneous cough.

## 2022-01-09 ENCOUNTER — Telehealth: Payer: Self-pay | Admitting: Pulmonary Disease

## 2022-01-09 NOTE — Telephone Encounter (Signed)
Please schedule patient for hospital follow up with Dr. Everardo All on 2/16 for pneumonia.   Thanks, JD

## 2022-01-09 NOTE — Progress Notes (Signed)
She Progress Note   Patient: Claire [name] XNT:[id number] DOB: 1951-09-04 DOA: 01/06/2022     3 DOS: the patient was seen and examined on 01/09/2022   Brief hospital course: Claire [name] is a 71 y.o. female with medical history significant for COPD on nocturnal supplemental O2, hypertension, anxiety/PTSD/panic disorder, hypothyroidism, anemia, ongoing tobacco use who is admitted with left upper lobe masslike consolidative pneumonia and left lower lobe cavitary pneumonia .Pulmonary following.  Assessment and Plan: * Cavitary pneumonia -CT showed multiple cavity pneumonia on left -She is at baseline oxygen requirement. cont IV Unasyn. -Patient does have history of extensive emphysema and COPD, she is on 2 to 3 L of oxygen at home.  Also reported history of 20 pound weight loss. -No history of exposure to tuberculosis. -Blood cultures, sputum cultures, Legionella, streptococcal. -Sputum AFB, induced sputum.  No clinical suspicion for tuberculosis. -pulmonary following. -Possible underlying malignancy with active smoking history. -Pulmonary plans for repeat CT scan before attempting bronchoscopy.  She will benefit with palliative care.  We will consult.  Physical debility Seen by PT OT.  Recommended home health PT.  Patient is planning to go to daughter's house.  Protein-calorie malnutrition, severe (HCC) Nutrition Status: Nutrition Problem: Severe Malnutrition Etiology: chronic illness (COPD) Signs/Symptoms: severe fat depletion, severe muscle depletion, energy intake < or equal to 75% for > or equal to 1 month Interventions: MVI, Education    Tobacco use- (present on admission) Reports ongoing tobacco use.  Nicotine patch ordered.  Counseled for cessation  Hypomagnesemia- (present on admission) Replaced and improved.  COPD (chronic obstructive pulmonary disease) (HCC)- (present on admission) Not in acute exacerbation.on inhalers at home.  Oxygen requirement at baseline.  Needs to  follow-up with pulmonary as an outpatient Currently remains on [place].  Pulmonary recommended discharging on increased dose of Dulera.  Anxiety- (present on admission) - Continue home Klonopin 0.5 mg 3 times daily and Atarax as needed  Anemia of chronic disease- (present on admission) Hemoglobin 7.6 on admission compared to baseline 8-10.  History of iron deficiency anemia .  No history of acute blood loss.  -Continue iron supplement  Chronic use of benzodiazepine for therapeutic purpose Continue current medication  Acquired hypothyroidism- (present on admission) on Synthroid.        Subjective: Patient was seen and examined.  Had some confusion.  She had episodes where she would forget where she is.  She tells me that her breathing is better, she had episode of shortness of breath but improved with inhaler overnight. Patient asking me whether this is going to kill her.  I told her that she may have underlying cavitary cancer.  She is not in a great shape to go for bronchoscopy at this time and should avoid as much possible.  Patient understands.  Afebrile.  No family at bedside.  Physical Exam: Vitals:   01/08/22 0900 01/08/22 1317 01/09/22 0808 01/09/22 0809  BP:  112/62    Pulse:  75    Resp:  20    Temp:  97.8 F (36.6 C)    TempSrc:  Oral    SpO2: 96% 91% 93% 92%  Weight:      Height:       General: Thin.  Cachectic.  Chronically sick looking.  On 2 L oxygen but looks comfortable at rest. Cardiovascular: S1-S2 normal.  Regular rate rhythm. Respiratory: Mostly clear.  She has some conducted airway sounds on the anterior left lung fields. Gastrointestinal: Soft.  Nontender.  Bowel sound present. Ext: No swelling or edema.  No cyanosis. Neuro: Alert oriented.  Episodes of confusion which is likely delirium. Musculoskeletal: No deformities.   Data Reviewed:  CT scan results cultures reviewed.  Negative so far.  Family Communication: None.  Will talk to  her daughter.  Disposition: Status is: Inpatient Remains inpatient appropriate because: IV antibiotics, cultures pending          Planned Discharge Destination: Home with Home Health     Time spent: 35 minutes  Author: Dorcas Carrow, MD 01/09/2022 10:00 AM  For on call review [email or web address].

## 2022-01-09 NOTE — Progress Notes (Signed)
NAME:  Claire [name], MRN:  [id number], DOB:  August 13, 1951, LOS: 3 ADMISSION DATE:  01/06/2022, CONSULTATION DATE:  01/07/22 REFERRING MD:  Allena Katz CHIEF COMPLAINT:  Cavitary Pneumonia   History of Present Illness:  71 year old female active smoker with emphysema, chronic hypoxemic respiratory failure, HTN admitted after failing outpatient treatment for pneumonia with symptoms of shortness of breath and productive cough for >7 days. Non-adherent to oxygen at home. Denies fevers, night sweats. Recent 19lb weight loss in the last 3 months. Poor appetite. Son at bedside providing additional history. Patient poor historian but states +history of TB exposure >10-20 years ago, no treatment.  Pertinent  Medical History   Past Medical History:  Diagnosis Date   Acid reflux    Anxiety    High blood pressure    High serum parathyroid hormone (PTH) 07/19/2017   96 06/28/2014, 162 12/29/16   Hypothyroid    PTSD (post-traumatic stress disorder)     Significant Hospital Events: Including procedures, antibiotic start and stop dates in addition to other pertinent events   [facility]2 [facility], failed outpatient doxycycline 2/3 PCCM consulted  CT Chest  CT Chest 01/06/22 reviewed with severe bullous emphysema with upper lobe predominance, LUL consolidation with ?cavitation vs bullae and multiple thick walled cavities in LLL including in superior segment measuring 6.8 x 3.4 and 1.4x1.6x4.6 cm.   Interim History / Subjective:   No acute events overnight.   Patient is feeling ok.  Didn't note much improvement after trying long acting nebulizer treatments.  Patient's daughter is at bedside. All questions answered.  Objective   Blood pressure 112/62, pulse 75, temperature 97.8 F (36.6 C), temperature source Oral, resp. rate 20, height 5' (1.524 m), weight 41 kg, SpO2 92 %.    FiO2 (%):  [32 %] 32 %   Intake/Output Summary (Last 24 hours) at 01/09/2022 1043 Last data filed at 01/08/2022 2300 Gross per 24 hour   Intake 380 ml  Output 275 ml  Net 105 ml   Filed Weights   01/06/22 1358  Weight: 41 kg    Examination: General: elderly woman, sitting up in bed, no acute distress HENT: [place]/AT, moist mucous membranes, sclera anicteric Lungs: diminished breath sounds Cardiovascular: rrr, s1s2, no murmurs Abdomen: soft, non-tender, non-distended, BS+ Extremities: warm, no edema Neuro: alert, moving all extremities  Resolved Hospital Problem list     Assessment & Plan:  Acute on chronic hypoxemic respiratory failure Cavitary lung lesions concerning for pneumonia vs neoplasm Recent 19 lb weight loss. CT Chest 01/06/22 reviewed with severe bullous emphysema with upper lobe predominance, LUL consolidation with ?cavitation vs bullae and multiple thick walled cavities in LLL including in superior segment measuring 6.8 x 3.4 and 1.4x1.6x4.6 cm. Associated ground glass airspace opacities.  --Wean supplemental O2 for goal SpO2 >88% --Follow up AFB sputum cultures. Sputum induction ordered --F/u quantiferon gold --Sputum culture pending - f/u legionella and strep pneumoniae antigens --Continue IV unasyn. Will need 3-4 weeks of treatment with PO Augmentin with plan for repeat CT without contrast. If cavitary lesions persistent, will warrant bronchoscopy to rule out possible malignancy. Outpatient follow up will be arranged with Dr. Everardo All on 2/16   Bullous emphysema Active tobacco abuse -- continue nebulizer treatments while inpatient with budesonide, brovana and yupelri -- at time of discharge patient is to use Dulera 200-5 mcg TWO puffs TWICE a day and Spiriva Respimat 2.6 mcg TWO puffs ONCE a day along with albuterol nebulizer or inhaler (1-2 puffs) every 4-6  hours as needed --Currently on nicotine patch   Pulmonary will  sign off  Best Practice (right click and "Reselect all SmartList Selections" daily)   Per primary  Labs   CBC: Recent Labs  Lab 01/06/22 1441 01/07/22 0400 01/08/22 1003   WBC 14.2* 12.9* 10.7*  NEUTROABS 11.8*  --  7.8*  HGB 7.6* 7.2* 7.9*  HCT 24.0* 22.8* 25.6*  MCV 97.2 97.9 100.4*  PLT 325 296 339    Basic Metabolic Panel: Recent Labs  Lab 01/06/22 1441 01/07/22 0400  NA 134* 131*  K 3.7 3.5  CL 88* 87*  CO2 34* 33*  GLUCOSE 120* 94  BUN 20 25*  CREATININE 1.10* 0.93  CALCIUM 8.9 8.5*  MG 1.1* 1.8   GFR: Estimated Creatinine Clearance: 35.9 mL/min (by C-G formula based on SCr of 0.93 mg/dL). Recent Labs  Lab 01/06/22 1441 01/06/22 2327 01/07/22 0400 01/08/22 1003  PROCALCITON  --   --  0.55  --   WBC 14.2*  --  12.9* 10.7*  LATICACIDVEN 1.9 1.2  --   --     Liver Function Tests: Recent Labs  Lab 01/06/22 1441  AST 38  ALT 19  ALKPHOS 180*  BILITOT 0.9  PROT 7.2  ALBUMIN 2.8*   No results for input(s): LIPASE, AMYLASE in the last 168 hours. No results for input(s): AMMONIA in the last 168 hours.  ABG No results found for: PHART, PCO2ART, PO2ART, HCO3, TCO2, ACIDBASEDEF, O2SAT   Coagulation Profile: Recent Labs  Lab 01/06/22 1441  INR 1.2    Cardiac Enzymes: No results for input(s): CKTOTAL, CKMB, CKMBINDEX, TROPONINI in the last 168 hours.  HbA1C: No results found for: HGBA1C  CBG: No results for input(s): GLUCAP in the last 168 hours.  Critical care time: n/a    Melody Comas, MD [facility] Office: [phone]   See Amion for personal pager PCCM on call pager [phone] until 7pm. Please call Elink 7p-7a. [phone]

## 2022-01-09 NOTE — Plan of Care (Signed)
  Problem: Elimination: Goal: Will not experience complications related to bowel motility Outcome: Progressing Goal: Will not experience complications related to urinary retention Outcome: Progressing   Problem: Safety: Goal: Ability to remain free from injury will improve Outcome: Progressing   

## 2022-01-10 ENCOUNTER — Inpatient Hospital Stay (HOSPITAL_COMMUNITY): Payer: Medicare Other

## 2022-01-10 DIAGNOSIS — J449 Chronic obstructive pulmonary disease, unspecified: Secondary | ICD-10-CM

## 2022-01-10 DIAGNOSIS — Z720 Tobacco use: Secondary | ICD-10-CM

## 2022-01-10 DIAGNOSIS — E43 Unspecified severe protein-calorie malnutrition: Secondary | ICD-10-CM

## 2022-01-10 DIAGNOSIS — D638 Anemia in other chronic diseases classified elsewhere: Secondary | ICD-10-CM

## 2022-01-10 DIAGNOSIS — Z79899 Other long term (current) drug therapy: Secondary | ICD-10-CM

## 2022-01-10 DIAGNOSIS — E039 Hypothyroidism, unspecified: Secondary | ICD-10-CM

## 2022-01-10 DIAGNOSIS — R5381 Other malaise: Secondary | ICD-10-CM

## 2022-01-10 DIAGNOSIS — F419 Anxiety disorder, unspecified: Secondary | ICD-10-CM

## 2022-01-10 LAB — CULTURE, RESPIRATORY W GRAM STAIN

## 2022-01-10 MED ORDER — GUAIFENESIN 100 MG/5ML PO LIQD: 10.0000 mL | Freq: Two times a day (BID) | ORAL | 0 refills | Status: DC

## 2022-01-10 MED ORDER — IPRATROPIUM-ALBUTEROL 0.5-2.5 (3) MG/3ML IN SOLN: 3.0000 mL | RESPIRATORY_TRACT | 1 refills | Status: AC | PRN

## 2022-01-10 MED ORDER — AMOXICILLIN-POT CLAVULANATE 875-125 MG PO TABS
1.0000 | ORAL_TABLET | Freq: Two times a day (BID) | ORAL | 0 refills | Status: AC
Start: 2022-01-10 — End: 2022-02-07

## 2022-01-10 MED ORDER — NICOTINE 21 MG/24HR TD PT24: 21.0000 mg | MEDICATED_PATCH | Freq: Every day | TRANSDERMAL | 0 refills | Status: DC

## 2022-01-10 NOTE — Telephone Encounter (Signed)
Appt scheduled for 01/20/22 at 11 am with Dr Everardo All.

## 2022-01-10 NOTE — Discharge Summary (Signed)
Physician Discharge Summary   Patient: Claire [name] MRN: [id number] DOB: 09-15-1951  Admit date:     01/06/2022  Discharge date: 01/10/22  Discharge Physician: Joycelyn Das   PCP: Jomarie Longs, PA-C   Recommendations at discharge:   Follow-up with Dr. Everardo All pulmonary on 01/20/2022.  Plan is to do repeat CT scan and possible bronchoscopy as outpatient if not improved. Patient has been prescribed 4 weeks of oral Augmentin on discharge. Follow-up with primary care physician in 1 week.  Check blood work at that time  Discharge Diagnoses: Principal Problem:   Cavitary pneumonia Active Problems:   Acquired hypothyroidism   Chronic use of benzodiazepine for therapeutic purpose   Anemia of chronic disease   Anxiety   COPD (chronic obstructive pulmonary disease) (HCC)   Hypomagnesemia   Tobacco use   Protein-calorie malnutrition, severe (HCC)   Physical debility  Resolved Problems:   * No resolved hospital problems. *   Hospital Course: Claire [name] is a 71 y.o. female with medical history significant for COPD on nocturnal supplemental O2, hypertension, anxiety/PTSD/panic disorder, hypothyroidism, anemia, ongoing tobacco presented to hospital with worsening shortness of breath, generalized weakness.  Chest x-ray in the ED on 12/31/2021 had showed masslike consolidation in the left upper lobe and patient was given oral antibiotics and discharged home but then came to the ED with persistent shortness of breath.  In the ED, patient was noted to have leukocytosis.  CT of the chest showed masslike consolidation in the left upper lobe with thick walled cavity in the left lower lobe.  Patient was given IV antibiotics and was admitted hospital for further evaluation and treatment.  Assessment and Plan: Cavitary pneumonia CT scan of the chest showing cavitary pneumonia.  Patient does have a history of COPD and is on 2 to 3 L of oxygen at baseline.  Pulmonary was consulted during  hospitalization.  Patient was initially on IV Unasyn and will be transition to oral Augmentin on discharge.  Sputum culture with rare WBC.  Blood cultures negative in 4 days.  No clinical suspicion for PTB.  Pulmonary plans for repeat CT scan before attempting bronchoscopy as outpatient. If her cavitary lesions persist at that time might need bronchoscopy. Patient will have to follow-up with pulmonary on 01/20/2022.    Physical debility Was seen by physical therapy and Occupational Therapy.  Recommended home health PT on discharge..  Protein-calorie malnutrition, severe (HCC)- (present on admission) Nutrition Status: Nutrition Problem: Severe Malnutrition Etiology: chronic illness (COPD) Signs/Symptoms: severe fat depletion, severe muscle depletion, energy intake < or equal to 75% for > or equal to 1 month Was seen by nutrition services during hospitalization  Tobacco use- (present on admission) Continue nicotine patch.  Hypomagnesemia- (present on admission) Improved.  COPD (chronic obstructive pulmonary disease) (HCC)- (present on admission) Continue inhalers on discharge.  Not in acute exacerbation we will continue inhalers.  Will need to follow-up with pulmonary as outpatient.  Patient is on baseline oxygen requirement at this time  Anxiety- (present on admission) Continue Klonopin and hydroxyzine.  Anemia of chronic disease- (present on admission) Latest hemoglobin of 7.9.  Baseline around 8-10.  History of iron deficiency anemia.  No history of acute blood loss.  Continue iron supplements.   Chronic use of benzodiazepine for therapeutic purpose Continue Klonopin.  Acquired hypothyroidism- (present on admission) Continue Synthroid.  Disposition.  At this time patient is stable for disposition home with home health.Claire [name] with the patient's family at bedside regarding  disposition    Consultants: Pulmonary Procedures performed: None Disposition: Home health Diet  recommendation:  Discharge Diet Orders (From admission, onward)     Start     Ordered   01/10/22 0000  Diet general        01/10/22 1054           Regular diet  DISCHARGE MEDICATION: Allergies as of 01/10/2022       Reactions   Bactrim [sulfamethoxazole-trimethoprim]    ACUTE RENAL fAILURE/HYPONATREMIA/HYPERKALEMIA   Macrodantin [nitrofurantoin Macrocrystal] Rash   Medrol [methylprednisolone]    Hallucinations   Trimethoprim    Fatigue/weakness/no appetite   Zithromax [azithromycin]    diarrhea        Medication List     TAKE these medications    albuterol 108 (90 Base) MCG/ACT inhaler Commonly known as: VENTOLIN HFA INHALE 1-2 PUFFS INTO THE LUNGS EVERY 2 HOURS AS NEEDED FOR WHEEZING OR SHORTNESS OF BREATH. What changed: See the new instructions.   AMBULATORY NON FORMULARY MEDICATION Continuous stationary and portable oxygen tanks for use with ambulation. DX: COPD with hypoxia.   AMBULATORY NON FORMULARY MEDICATION Medication Name: full face mask to use with oxygen   amoxicillin-clavulanate 875-125 MG tablet Commonly known as: Augmentin Take 1 tablet by mouth 2 (two) times daily for 28 days.   atenolol 25 MG tablet Commonly known as: TENORMIN Take 1 tablet (25 mg total) by mouth 2 (two) times daily. What changed: how much to take   cetirizine 10 MG chewable tablet Commonly known as: ZYRTEC Chew 10 mg by mouth daily.   cholecalciferol 25 MCG (1000 UNIT) tablet Commonly known as: VITAMIN D Take 1,000 Units by mouth daily.   clonazePAM 0.5 MG tablet Commonly known as: KLONOPIN Take 1 tablet (0.5 mg total) by mouth 3 (three) times daily.   dicyclomine 10 MG capsule Commonly known as: BENTYL Take 10-20 mg by mouth in the morning and at bedtime.   Dulera 100-5 MCG/ACT Aero Generic drug: mometasone-formoterol TAKE 2 PUFFS BY MOUTH TWICE A DAY What changed: See the new instructions.   estradiol 0.1 MG/GM vaginal cream Commonly known as:  ESTRACE PLACE ONE APPLICATOR FULL THREE TIMES A WEEK AND ONE PEA SIZED AMOUNT APPLIED TO URETHRAL OPENING What changed: See the new instructions.   famotidine 20 MG tablet Commonly known as: PEPCID TAKE 1 TABLET BY MOUTH TWICE A DAY What changed:  when to take this reasons to take this   ferrous sulfate 325 (65 FE) MG EC tablet 1 tab PO TID every other day What changed:  how much to take how to take this when to take this additional instructions   Florajen Acidophilus Caps Take 1 capsule by mouth daily.   guaiFENesin 100 MG/5ML liquid Commonly known as: ROBITUSSIN Take 10 mLs by mouth 2 (two) times daily.   ipratropium 0.06 % nasal spray Commonly known as: ATROVENT Place 2 sprays into both nostrils 4 (four) times daily.   ipratropium-albuterol 0.5-2.5 (3) MG/3ML Soln Commonly known as: DUONEB Take 3 mLs by nebulization every 2 (two) hours as needed (wheeze, SOB).   levothyroxine 75 MCG tablet Commonly known as: SYNTHROID Take 1 tablet (75 mcg total) by mouth daily.   nicotine 21 mg/24hr patch Commonly known as: NICODERM CQ - dosed in mg/24 hours Place 1 patch (21 mg total) onto the skin daily. Start taking on: January 11, 2022   Spiriva Respimat 2.5 MCG/ACT Aers Generic drug: Tiotropium Bromide Monohydrate Inhale 2 puffs into the lungs daily.  Follow-up Information     Tandy Gaw L, PA-C Follow up.   Specialty: Family Medicine Contact information: [address] [place] [address] [phone]         Luciano Cutter, MD Follow up on 01/20/2022.   Specialty: Pulmonary Disease Contact information: [address] [place] [address] [phone]                Subjective Patient was seen and examined at bedside.  Patient feels better with breathing.  Wishes to go home.  Patient's daughter at bedside.  Discharge Exam: Filed Weights   01/06/22 1358  Weight: 41 kg   Vitals with BMI 01/10/2022  01/09/2022 01/09/2022  Height - - -  Weight - - -  BMI - - -  Systolic 103 123 578  Diastolic 68 67 69  Pulse 89 77 75  Some encounter information is confidential and restricted. Go to Review Flowsheets activity to see all data.    General: Thinly built, cachectic,, not in obvious distress, on 2 L of oxygen HENT:   No scleral pallor or icterus noted. Oral mucosa is moist.  Chest:  Diminished breath sounds bilaterally. CVS: S1 &S2 heard. No murmur.  Regular rate and rhythm. Abdomen: Soft, nontender, nondistended.  Bowel sounds are heard.   Extremities: No cyanosis, clubbing or edema.  Peripheral pulses are palpable. Psych: Alert, awake and communicative. CNS:  No cranial nerve deficits.  Power equal in all extremities.   Skin: Warm and dry.  No rashes noted.  Condition at discharge: good  The results of significant diagnostics from this hospitalization (including imaging, microbiology, ancillary and laboratory) are listed below for reference.   Imaging Studies: DG Chest 2 View  Result Date: 12/31/2021 CLINICAL DATA:  Generalized weakness, leukocytosis EXAM: CHEST - 2 VIEW COMPARISON:  None. FINDINGS: Frontal and lateral views of the chest are obtained. Cardiac silhouette is unremarkable. There is masslike consolidation in the left suprahilar region, with spiculated margins. While this could reflect pneumonia, close follow-up will be required to exclude underlying mass lesion. No effusion or pneumothorax. Background emphysema. No acute bony abnormalities. IMPRESSION: 1. Masslike consolidation within the left upper lobe suprahilar region. Given clinical history, this could reflect left upper lobe pneumonia. However follow-up PA and lateral chest X-ray is recommended in 3-4 weeks following trial of antibiotic therapy to ensure resolution and exclude underlying malignancy. 2. Emphysema. Electronically Signed   By: Sharlet Salina M.D.   On: 12/31/2021 20:37   CT Head Wo Contrast  Result Date:  01/06/2022 CLINICAL DATA:  Mental status change EXAM: CT HEAD WITHOUT CONTRAST TECHNIQUE: Contiguous axial images were obtained from the base of the skull through the vertex without intravenous contrast. RADIATION DOSE REDUCTION: This exam was performed according to the departmental dose-optimization program which includes automated exposure control, adjustment of the mA and/or kV according to patient size and/or use of iterative reconstruction technique. COMPARISON:  None. FINDINGS: Brain: No evidence of acute infarction, hemorrhage, hydrocephalus, extra-axial collection or mass lesion/mass effect. Scattered area of low attenuation in the periventricular and subcortical white matter presumed mild chronic microvascular ischemic changes. Vascular: No hyperdense vessel or unexpected calcification. Skull: Normal. Negative for fracture or focal lesion. Sinuses/Orbits: No acute finding. Other: None. IMPRESSION: No acute intracranial abnormality. Electronically Signed   By: Larose Hires D.O.   On: 01/06/2022 17:16   CT CHEST WO CONTRAST  Result Date: 01/06/2022 CLINICAL DATA:  Pneumonia, increasing confusion EXAM: CT CHEST WITHOUT  CONTRAST TECHNIQUE: Multidetector CT imaging of the chest was performed following the standard protocol without IV contrast. The patient refused intravenous contrast, limiting evaluation of the mediastinum, hila, and vascular structures. RADIATION DOSE REDUCTION: This exam was performed according to the departmental dose-optimization program which includes automated exposure control, adjustment of the mA and/or kV according to patient size and/or use of iterative reconstruction technique. COMPARISON:  12/31/2021 FINDINGS: Cardiovascular: Limited unenhanced imaging of the heart and great vessels demonstrates no pericardial effusion. Normal caliber of the thoracic aorta. Diffuse atherosclerosis of the aorta and coronary vasculature. Mediastinum/Nodes: No evidence of mediastinal adenopathy.  Evaluation of hilar regions is limited by the lack of intravenous contrast. Thyroid, trachea, and esophagus are unremarkable. Lungs/Pleura: Extensive upper lobe predominant bullous emphysema. The masslike consolidation within the left upper lobe on recent chest x-ray is again identified, measuring up to 5.9 x 4.4 cm reference image 70/2. The appearance is most consistent with pneumonia. However, progress study appropriate medical management is recommended to document resolution and exclude underlying neoplasm. There are additional areas of ground-glass airspace disease scattered throughout the left upper lobe as well. Thick-walled cavity is seen within the superior segment of the left lower lobe, measuring 6.8 x 3.4 cm reference image 76/2, and extending approximately 6.0 cm in craniocaudal extent. Cylindrical area of consolidation elsewhere within the left lower lobe measuring up to 1.4 x 1.6 by 4.6 cm also contain central areas of cavitation. Differential includes cavitating pneumonia, superinfected bulla, or less likely cavitating neoplasm. Pulmonology consultation may be useful. No effusion or pneumothorax. Right chest is clear. Central airways are patent. Upper Abdomen: No acute abnormality. Musculoskeletal: No acute or destructive bony lesions. Mild anterior wedging of the midthoracic vertebral bodies is likely chronic. Reconstructed images demonstrate no additional findings. IMPRESSION: 1. Masslike consolidation within the left upper lobe, corresponding to chest x-ray finding, with an appearance most consistent with pneumonia. Followup imaging is recommended in 3-4 weeks following trial of antibiotic therapy to ensure resolution and exclude underlying malignancy. 2. Thick-walled cavity within the superior segment of the left lower lobe, with other cavitating areas of consolidation within the left lower lobe. Differential diagnosis includes cavitating pneumonia, superinfected bulla, or cavitating neoplasm.  Again, progress study will be needed to assess resolution after appropriate medical management. Pulmonology consultation may be useful. 3. Aortic Atherosclerosis (ICD10-I70.0) and Emphysema (ICD10-J43.9). Electronically Signed   By: Sharlet Salina M.D.   On: 01/06/2022 17:26    Microbiology: Results for orders placed or performed during the hospital encounter of 01/06/22  Resp Panel by RT-PCR (Flu A&B, Covid) Nasopharyngeal Swab     Status: None   Collection Time: 01/06/22  2:26 PM   Specimen: Nasopharyngeal Swab; Nasopharyngeal(NP) swabs in vial transport medium  Result Value Ref Range Status   SARS Coronavirus 2 by RT PCR NEGATIVE NEGATIVE Final    Comment: (NOTE) SARS-CoV-2 target nucleic acids are NOT DETECTED.  The SARS-CoV-2 RNA is generally detectable in upper respiratory specimens during the acute phase of infection. The lowest concentration of SARS-CoV-2 viral copies this assay can detect is 138 copies/mL. A negative result does not preclude SARS-Cov-2 infection and should not be used as the sole basis for treatment or other patient management decisions. A negative result may occur with  improper specimen collection/handling, submission of specimen other than nasopharyngeal swab, presence of viral mutation(s) within the areas targeted by this assay, and inadequate number of viral copies(<138 copies/mL). A negative result must be combined with clinical observations, patient history, and epidemiological information.  The expected result is Negative.  Fact Sheet for Patients:  BloggerCourse.comhttps://www.fda.gov/media/152166/download  Fact Sheet for Healthcare Providers:  SeriousBroker.ithttps://www.fda.gov/media/152162/download  This test is no t yet approved or cleared by the [place] FDA and  has been authorized for detection and/or diagnosis of SARS-CoV-2 by FDA under an Emergency Use Authorization (EUA). This EUA will remain  in effect (meaning this test can be used) for the duration of  the COVID-19 declaration under Section 564(b)(1) of the Act, 21 U.S.C.section 360bbb-3(b)(1), unless the authorization is terminated  or revoked sooner.       Influenza A by PCR NEGATIVE NEGATIVE Final   Influenza B by PCR NEGATIVE NEGATIVE Final    Comment: (NOTE) The Xpert Xpress SARS-CoV-2/FLU/RSV plus assay is intended as an aid in the diagnosis of influenza from Nasopharyngeal swab specimens and should not be used as a sole basis for treatment. Nasal washings and aspirates are unacceptable for Xpert Xpress SARS-CoV-2/FLU/RSV testing.  Fact Sheet for Patients: BloggerCourse.comhttps://www.fda.gov/media/152166/download  Fact Sheet for Healthcare Providers: SeriousBroker.ithttps://www.fda.gov/media/152162/download  This test is not yet approved or cleared by the [place] FDA and has been authorized for detection and/or diagnosis of SARS-CoV-2 by FDA under an Emergency Use Authorization (EUA). This EUA will remain in effect (meaning this test can be used) for the duration of the COVID-19 declaration under Section 564(b)(1) of the Act, 21 U.S.C. section 360bbb-3(b)(1), unless the authorization is terminated or revoked.  Performed at [facility] [place], [address] [place], [place] [address]   Culture, blood (routine x 2)     Status: None (Preliminary result)   Collection Time: 01/06/22  2:41 PM   Specimen: BLOOD  Result Value Ref Range Status   Specimen Description   Final    BLOOD Blood Culture adequate volume Performed at [facility] [place], [address] [place], [place] [address]    Special Requests   Final    BOTTLES DRAWN AEROBIC AND ANAEROBIC BLOOD RIGHT FOREARM Performed at [facility] [place], [address] [place], [place] [address]    Culture   Final    NO GROWTH 4 DAYS Performed at [facility], [address] [place], [place] [address]    Report Status PENDING  Incomplete  Culture, blood (routine x 2)     Status: None (Preliminary result)   Collection  Time: 01/06/22  3:07 PM   Specimen: BLOOD  Result Value Ref Range Status   Specimen Description   Final    BLOOD Blood Culture adequate volume Performed at [facility] [place], [address] [place], [place] [address]    Special Requests   Final    BOTTLES DRAWN AEROBIC AND ANAEROBIC RESISTANT WRIST Performed at [facility] [place], [address] [place], [place] [address]    Culture   Final    NO GROWTH 4 DAYS Performed at [facility], [address] [place], [place] [address]    Report Status PENDING  Incomplete  Expectorated Sputum Assessment w Gram Stain, Rflx to Resp Cult     Status: None   Collection Time: 01/07/22  6:24 PM   Specimen: Sputum  Result Value Ref Range Status   Specimen Description SPUTUM  Final   Special Requests NONE  Final   Sputum evaluation   Final    THIS SPECIMEN IS ACCEPTABLE FOR SPUTUM CULTURE Performed at [facility], [address] [place], [place] [address]    Report  Status 01/07/2022 FINAL  Final  Culture, Respiratory w Gram Stain     Status: None   Collection Time: 01/07/22  6:24 PM   Specimen: SPU  Result Value Ref Range Status   Specimen Description   Final    SPUTUM Performed at [facility], [address] [place], [place] [address]    Special Requests   Final    NONE Reflexed from [id number] Performed at [facility], [address] [place], [place] [address]    Gram Stain   Final    RARE WBC PRESENT, PREDOMINANTLY MONONUCLEAR RARE YEAST Performed at [facility], [address] [place], [place] [address]    Culture FEW CANDIDA ALBICANS  Final   Report Status 01/10/2022 FINAL  Final    Labs: CBC: Recent Labs  Lab 01/06/22 1441 01/07/22 0400 01/08/22 1003  WBC 14.2* 12.9* 10.7*  NEUTROABS 11.8*  --  7.8*  HGB 7.6* 7.2* 7.9*  HCT 24.0* 22.8* 25.6*  MCV 97.2 97.9 100.4*  PLT 325 296 339   Basic Metabolic Panel: Recent Labs  Lab  01/06/22 1441 01/07/22 0400  NA 134* 131*  K 3.7 3.5  CL 88* 87*  CO2 34* 33*  GLUCOSE 120* 94  BUN 20 25*  CREATININE 1.10* 0.93  CALCIUM 8.9 8.5*  MG 1.1* 1.8   Liver Function Tests: Recent Labs  Lab 01/06/22 1441  AST 38  ALT 19  ALKPHOS 180*  BILITOT 0.9  PROT 7.2  ALBUMIN 2.8*   CBG: No results for input(s): GLUCAP in the last 168 hours.  Discharge time spent: greater than 30 minutes.  Signed: Joycelyn DasLaxman Kymia Simi, MD [facility] 01/10/2022

## 2022-01-10 NOTE — TOC Progression Note (Signed)
Transition of Care [facility]) - Progression Note    Patient Details  Name: Claire [name] MRN: [id number] Date of Birth: 10/31/1951  Transition of Care [facility]) CM/SW Contact  Golda Acre, RN Phone Number: 01/10/2022, 12:08 PM  Clinical Narrative:    Following for toc needs   Expected Discharge Plan: Home/Self Care Barriers to Discharge: Continued Medical Work up  Expected Discharge Plan and Services Expected Discharge Plan: Home/Self Care   Discharge Planning Services: CM Consult   Living arrangements for the past 2 months: Single Family Home Expected Discharge Date: 01/10/22                                     Social Determinants of Health (SDOH) Interventions    Readmission Risk Interventions No flowsheet data found.

## 2022-01-10 NOTE — Care Management Important Message (Signed)
Important Message  Patient Details IM Letter given to the Patient. Name: Claire [name] MRN: [id number] Date of Birth: Aug 06, 1951   Medicare Important Message Given:  Yes     Caren Macadam 01/10/2022, 11:08 AM

## 2022-01-10 NOTE — Plan of Care (Signed)
Pt successfully ambulated in hall w/ RW. PIV removed. DC paperwork reviewed with pt and pt's daughter. Questions answered. Nebulizer device and Duoneb ordered for pt. Pt brought to lobby via WC   Problem: Education: Goal: Knowledge of General Education information will improve Description: Including pain rating scale, medication(s)/side effects and non-pharmacologic comfort measures Outcome: Completed/Met   Problem: Health Behavior/Discharge Planning: Goal: Ability to manage health-related needs will improve Outcome: Completed/Met   Problem: Clinical Measurements: Goal: Ability to maintain clinical measurements within normal limits will improve Outcome: Completed/Met Goal: Will remain free from infection Outcome: Completed/Met Goal: Diagnostic test results will improve Outcome: Completed/Met Goal: Respiratory complications will improve Outcome: Completed/Met Goal: Cardiovascular complication will be avoided Outcome: Completed/Met   Problem: Activity: Goal: Risk for activity intolerance will decrease Outcome: Completed/Met   Problem: Nutrition: Goal: Adequate nutrition will be maintained Outcome: Completed/Met   Problem: Coping: Goal: Level of anxiety will decrease Outcome: Completed/Met   Problem: Elimination: Goal: Will not experience complications related to bowel motility Outcome: Completed/Met Goal: Will not experience complications related to urinary retention Outcome: Completed/Met   Problem: Pain Managment: Goal: General experience of comfort will improve Outcome: Completed/Met   Problem: Safety: Goal: Ability to remain free from injury will improve Outcome: Completed/Met   Problem: Skin Integrity: Goal: Risk for impaired skin integrity will decrease Outcome: Completed/Met   Problem: Activity: Goal: Ability to tolerate increased activity will improve Outcome: Completed/Met   Problem: Clinical Measurements: Goal: Ability to maintain a body temperature  in the normal range will improve Outcome: Completed/Met   Problem: Respiratory: Goal: Ability to maintain adequate ventilation will improve Outcome: Completed/Met Goal: Ability to maintain a clear airway will improve Outcome: Completed/Met

## 2022-01-10 NOTE — Progress Notes (Signed)
PT Cancellation Note  Patient Details Name: Claire [name] MRN: [id number] DOB: Feb 19, 1951   Cancelled Treatment:    Reason Eval/Treat Not Completed:  Attempted PT tx session-pt declined to participate on today. Will check back another day.    Faye Ramsay, PT Acute Rehabilitation  Office: [phone] Pager: [phone]

## 2022-01-11 DIAGNOSIS — J449 Chronic obstructive pulmonary disease, unspecified: Secondary | ICD-10-CM | POA: Diagnosis not present

## 2022-01-11 DIAGNOSIS — E43 Unspecified severe protein-calorie malnutrition: Secondary | ICD-10-CM | POA: Diagnosis not present

## 2022-01-11 DIAGNOSIS — Z720 Tobacco use: Secondary | ICD-10-CM | POA: Diagnosis not present

## 2022-01-11 LAB — QUANTIFERON-TB GOLD PLUS (RQFGPL)
QuantiFERON Mitogen Value: 0.23 IU/mL
QuantiFERON Nil Value: 0.06 IU/mL
QuantiFERON TB1 Ag Value: 0.05 IU/mL
QuantiFERON TB2 Ag Value: 0.05 IU/mL

## 2022-01-11 LAB — CULTURE, BLOOD (ROUTINE X 2)
Culture: NO GROWTH
Culture: NO GROWTH
Specimen Description: ADEQUATE
Specimen Description: ADEQUATE

## 2022-01-11 LAB — QUANTIFERON-TB GOLD PLUS: QuantiFERON-TB Gold Plus: UNDETERMINED — AB

## 2022-01-12 ENCOUNTER — Telehealth: Payer: Self-pay

## 2022-01-12 NOTE — Telephone Encounter (Signed)
Spoke with patient's daughter [name] and scheduled a Mychart Palliative Consult for 01/13/22 @ 2:30PM.   Consent obtained; updated Outlook/Netsmart/Team List and Epic.

## 2022-01-13 ENCOUNTER — Telehealth: Payer: Medicare Other | Admitting: Hospice

## 2022-01-13 ENCOUNTER — Other Ambulatory Visit: Payer: Self-pay

## 2022-01-13 DIAGNOSIS — Z515 Encounter for palliative care: Secondary | ICD-10-CM

## 2022-01-13 DIAGNOSIS — F419 Anxiety disorder, unspecified: Secondary | ICD-10-CM

## 2022-01-13 DIAGNOSIS — R531 Weakness: Secondary | ICD-10-CM | POA: Diagnosis not present

## 2022-01-13 DIAGNOSIS — J449 Chronic obstructive pulmonary disease, unspecified: Secondary | ICD-10-CM

## 2022-01-13 NOTE — Progress Notes (Signed)
Therapist, nutritional Palliative Care Consult Note Telephone: [phone]  Fax: [phone]  PATIENT NAME: Claire [name] [address] [place] [address] [phone] (home)  DOB: 06/17/1951 MRN: [id number]  PRIMARY CARE PROVIDER:    Nolene [name],  [address] [place] [address] [phone]  REFERRING PROVIDER:   Nolene [name] [address] [place],  [place] [address] [phone]  RESPONSIBLE PARTY:   Self/Claire [name] is [facility] Contact Information     Name Relation Home Work Mobile   [place] Daughter [phone]     Claire [name] [phone]         TELEHEALTH VISIT STATEMENT Due to the COVID-19 crisis, this visit was done via telemedicine from my office and it was initiated and consent by this patient and or family.  I connected with patient OR PROXY by a telephone/video  and verified that I am speaking with the correct person. I discussed the limitations of evaluation and management by telemedicine. The patient expressed understanding and agreed to proceed. Palliative Care was asked to follow this patient to address advance care planning, complex medical decision making and goals of care clarification. This is the initial visit.     ASSESSMENT AND / RECOMMENDATIONS:   Advance Care Planning: Our advance care planning conversation included a discussion about:    The value and importance of advance care planning  Difference between Hospice and Palliative care Exploration of goals of care in the event of a sudden injury or illness  Identification and preparation of a healthcare agent  Review and updating or creation of an  advance directive document . Decision not to resuscitate or to de-escalate disease focused treatments due to poor prognosis.  CODE STATUS:Patient affirmed she is a partial code.  Patient wants chest compressions with ACLS medications; no  intubation/mechanical ventilation  Goals of Care: Goals include to maximize quality of life and symptom management  I spent   minutes providing this initial consultation. More than 50% of the time in this consultation was spent on counseling patient and coordinating communication. --------------------------------------------------------------------------------------------------------------------------------------  Symptom Management/Plan: COPD: Exacerbated with recent pneumonia last month for which patient was hospitalized.  Continue Augmentin and to completion.  Continue oxygen supplementation 3 L/min and breathing treatments via nebulization as ordered.  Encourage slow deep breathing, avoid triggers. Weakness: PT/OT is in process.  Claire expecting a call to commence service. Anxiety: Calm approach, redirection, de-escalation.  Continue Klonopin as ordered Follow up: Palliative care will continue to follow for complex medical decision making, advance care planning, and clarification of goals. Return 6 weeks or prn. Encouraged to call provider sooner with any concerns.   Family /Caregiver/Community Supports: Patient lives at home with her daughter Claire [name].  Plan is for her to go back to her own place if she significantly improves.  HOSPICE ELIGIBILITY/DIAGNOSIS: TBD  Chief Complaint: Initial Palliative care visit  HISTORY OF PRESENT ILLNESS:  Claire [name] is a 71 y.o. year old female  with multiple medical conditions including COPD exacerbated by recent pneumonia for which she was hospitalized2/2 - 01/10/2022.  History of anxiety, panic attack, generalized weakness, microcytic anemia.  Patient denies pain/discomfort, endorses shortness of breath on exertion; reports oxygen supplementation is helpful.  History obtained from review of EMR, discussion with primary team, caregiver, family and/or Claire [name].  Review and summarization of Epic records shows history from other than patient. Rest of 10 point  ROS asked and  negative.  I reviewed as needed, available labs, patient records, imaging, studies and related documents from the EMR.  Recent Labs  Lab 01/07/22 0400 01/08/22 1003  WBC 12.9* 10.7*  HGB 7.2* 7.9*  HCT 22.8* 25.6*  PLT 296 339  MCV 97.9 100.4*   Recent Labs  Lab 01/07/22 0400  NA 131*  K 3.5  CL 87*  CO2 33*  BUN 25*  CREATININE 0.93  GLUCOSE 94    ROS General: NAD EYES: denies vision changes ENMT: denies dysphagia Cardiovascular: denies chest pain/discomfort Pulmonary: denies cough, endorses SOB on exertion Abdomen: endorses good appetite, denies constipation/diarrhea GU: denies dysuria, urinary frequency MSK:  endorses weakness,  no falls reported Skin: denies rashes or wounds Neurological: denies pain, denies insomnia Psych: Endorses positive mood Heme/lymph/immuno: denies bruises, abnormal bleeding   PAST MEDICAL HISTORY:  Active Ambulatory Problems    Diagnosis Date Noted   PTSD (post-traumatic stress disorder) 11/10/2011   Neurosis, anxiety, panic type 11/10/2011   Acquired hypothyroidism 11/24/2014   BP (high blood pressure) 07/24/2012   Chronic use of benzodiazepine for therapeutic purpose 08/11/2015   Hyponatremia 12/19/2016   Hyperkalemia 12/19/2016   Centrilobular emphysema ([place]) 12/20/2016   Stage 3a chronic kidney disease ([place]) 01/16/2017   Iron deficiency anemia 01/16/2017   Anemia of chronic disease 05/11/2017   Low serum vitamin B12 05/14/2017   Vitamin D deficiency 05/14/2017   Depressed mood 05/14/2017   High serum parathyroid hormone (PTH) 07/19/2017   Normocytic anemia 07/19/2017   DOE (dyspnea on exertion) 05/09/2018   Recurrent UTI 05/07/2019   Leg cramps 05/29/2019   No energy 05/29/2019   History of hypokalemia 10/09/2019   Vaccine counseling 12/24/2019   Menopausal vaginal dryness 12/24/2019   Chronic rhinitis 01/31/2020   Chronic respiratory failure with hypoxia ([place]) 01/31/2020   Unintended weight loss  02/19/2020   Macrocytic anemia 03/10/2020   Elevated ferritin 03/10/2020   Irritable bowel syndrome with diarrhea 03/10/2020   Anxiety 03/23/2020   Pernicious anemia 03/23/2020   Malnutrition of mild degree ([place]) 05/06/2020   Body mass index (BMI) of 19 or less in adult 05/08/2020   Constipation 07/13/2020   Lower extremity edema 07/29/2020   Clostridium difficile colitis 09/11/2020   Mild protein-calorie malnutrition ([place]) 01/19/2021   Underweight 01/22/2021   History of Clostridioides difficile colitis 02/22/2021   Medication management 03/15/2021   Cloudy urine 04/23/2021   Stage 3b chronic kidney disease ([place]) 04/23/2021   Acute cystitis with hematuria 04/26/2021   History of UTI 06/02/2021   Nasal congestion 08/06/2021   Stress due to family tension 09/02/2021   Current smoker 11/03/2021   Weakness 12/31/2021   Community acquired pneumonia of left upper lobe of lung 01/03/2022   Severe malnutrition ([place]) 01/03/2022   AKI (acute kidney injury) ([place]) 01/03/2022   Cavitary pneumonia 01/06/2022   COPD (chronic obstructive pulmonary disease) ([place]) 01/07/2022   Hypomagnesemia 01/07/2022   Tobacco use 01/07/2022   Protein-calorie malnutrition, severe ([place]) 01/07/2022   Physical debility 01/07/2022   Resolved Ambulatory Problems    Diagnosis Date Noted   Hypothyroid    Acute renal failure ([place]) 12/19/2016   Decreased hemoglobin 12/20/2016   Acute kidney injury ([place]) 03/17/2017   Sinobronchitis 06/16/2020   Past Medical History:  Diagnosis Date   Acid reflux    High blood pressure     SOCIAL HX:  Social History   Tobacco Use   Smoking status: Every Day    Packs/day: 1.00    Years: 55.00  Pack years: 55.00    Types: Cigarettes   Smokeless tobacco: Never  Substance Use Topics   Alcohol use: No     FAMILY HX:  Family History  Problem Relation Age of Onset   Anxiety disorder Mother    OCD Daughter    Alcohol abuse Father    Anxiety disorder Sister        ALLERGIES:  Allergies  Allergen Reactions   Bactrim [Sulfamethoxazole-Trimethoprim]     ACUTE RENAL fAILURE/HYPONATREMIA/HYPERKALEMIA   Macrodantin [Nitrofurantoin Macrocrystal] Rash   Medrol [Methylprednisolone]     Hallucinations   Trimethoprim     Fatigue/weakness/no appetite   Zithromax [Azithromycin]     diarrhea      PERTINENT MEDICATIONS:  Outpatient Encounter Medications as of 01/13/2022  Medication Sig   albuterol (VENTOLIN HFA) 108 (90 Base) MCG/ACT inhaler INHALE 1-2 PUFFS INTO THE LUNGS EVERY 2 HOURS AS NEEDED FOR WHEEZING OR SHORTNESS OF BREATH. (Patient taking differently: Inhale 1-2 puffs into the lungs every 2 (two) hours as needed for wheezing or shortness of breath.)   AMBULATORY NON FORMULARY MEDICATION Continuous stationary and portable oxygen tanks for use with ambulation. DX: COPD with hypoxia.   AMBULATORY NON FORMULARY MEDICATION Medication Name: full face mask to use with oxygen   amoxicillin-clavulanate (AUGMENTIN) 875-125 MG tablet Take 1 tablet by mouth 2 (two) times daily for 28 days.   atenolol (TENORMIN) 25 MG tablet Take 1 tablet (25 mg total) by mouth 2 (two) times daily. (Patient taking differently: Take 50 mg by mouth 2 (two) times daily.)   cetirizine (ZYRTEC) 10 MG chewable tablet Chew 10 mg by mouth daily.   cholecalciferol (VITAMIN D) 25 MCG (1000 UT) tablet Take 1,000 Units by mouth daily.   clonazePAM (KLONOPIN) 0.5 MG tablet Take 1 tablet (0.5 mg total) by mouth 3 (three) times daily.   dicyclomine (BENTYL) 10 MG capsule Take 10-20 mg by mouth in the morning and at bedtime.   DULERA 100-5 MCG/ACT AERO TAKE 2 PUFFS BY MOUTH TWICE A DAY (Patient taking differently: Inhale 2 puffs into the lungs in the morning and at bedtime.)   estradiol (ESTRACE) 0.1 MG/GM vaginal cream PLACE ONE APPLICATOR FULL THREE TIMES A WEEK AND ONE PEA SIZED AMOUNT APPLIED TO URETHRAL OPENING (Patient taking differently: Place 1 Applicatorful vaginally 3 (three) times a  week.)   famotidine (PEPCID) 20 MG tablet TAKE 1 TABLET BY MOUTH TWICE A DAY (Patient taking differently: Take 20 mg by mouth daily as needed for heartburn or indigestion.)   ferrous sulfate 325 (65 FE) MG EC tablet 1 tab PO TID every other day (Patient taking differently: Take 325 mg by mouth See admin instructions. 1 t TID every other day)   guaiFENesin (ROBITUSSIN) 100 MG/5ML liquid Take 10 mLs by mouth 2 (two) times daily.   ipratropium (ATROVENT) 0.06 % nasal spray Place 2 sprays into both nostrils 4 (four) times daily.   ipratropium-albuterol (DUONEB) 0.5-2.5 (3) MG/3ML SOLN Take 3 mLs by nebulization every 4 (four) hours as needed (wheeze, SOB).   Lactobacillus (FLORAJEN ACIDOPHILUS) CAPS Take 1 capsule by mouth daily.   levothyroxine (SYNTHROID) 75 MCG tablet Take 1 tablet (75 mcg total) by mouth daily.   nicotine (NICODERM CQ - DOSED IN MG/24 HOURS) 21 mg/24hr patch Place 1 patch (21 mg total) onto the skin daily.   SPIRIVA RESPIMAT 2.5 MCG/ACT AERS Inhale 2 puffs into the lungs daily. (Patient taking differently: Inhale 2 puffs into the lungs daily.)   No facility-administered encounter medications  on file as of 01/13/2022.     Thank you for the opportunity to participate in the care of Ms. Gossen.  The palliative care team will continue to follow. Please call our office at [phone] if we can be of additional assistance.   Note: Portions of this note were generated with Lobbyist. Dictation errors may occur despite best attempts at proofreading.  Teodoro Spray, NP

## 2022-01-14 ENCOUNTER — Telehealth: Payer: Self-pay | Admitting: Pulmonary Disease

## 2022-01-14 ENCOUNTER — Telehealth: Payer: Self-pay | Admitting: Neurology

## 2022-01-14 DIAGNOSIS — J432 Centrilobular emphysema: Secondary | ICD-10-CM

## 2022-01-14 DIAGNOSIS — J9611 Chronic respiratory failure with hypoxia: Secondary | ICD-10-CM

## 2022-01-14 DIAGNOSIS — J189 Pneumonia, unspecified organism: Secondary | ICD-10-CM

## 2022-01-14 NOTE — Telephone Encounter (Signed)
Patient's mother made aware.

## 2022-01-14 NOTE — Telephone Encounter (Signed)
Let pt daughter know it was ordered.

## 2022-01-14 NOTE — Telephone Encounter (Signed)
Patient's daughter called and left vm asking for an order for home health nursing and PT for patient, wants to get this ordered before appt on Monday. Pended, sign if appropriate.

## 2022-01-14 NOTE — Telephone Encounter (Signed)
Called patient's daughter but she did not answer. Left message for her to call back.  

## 2022-01-17 ENCOUNTER — Ambulatory Visit (INDEPENDENT_AMBULATORY_CARE_PROVIDER_SITE_OTHER): Payer: Medicare Other | Admitting: Physician Assistant

## 2022-01-17 ENCOUNTER — Other Ambulatory Visit: Payer: Self-pay

## 2022-01-17 ENCOUNTER — Encounter: Payer: Self-pay | Admitting: Physician Assistant

## 2022-01-17 VITALS — BP 109/66 | HR 94 | Ht 60.0 in

## 2022-01-17 DIAGNOSIS — J189 Pneumonia, unspecified organism: Secondary | ICD-10-CM

## 2022-01-17 DIAGNOSIS — E871 Hypo-osmolality and hyponatremia: Secondary | ICD-10-CM

## 2022-01-17 DIAGNOSIS — J9611 Chronic respiratory failure with hypoxia: Secondary | ICD-10-CM

## 2022-01-17 DIAGNOSIS — J984 Other disorders of lung: Secondary | ICD-10-CM

## 2022-01-17 DIAGNOSIS — E43 Unspecified severe protein-calorie malnutrition: Secondary | ICD-10-CM | POA: Diagnosis not present

## 2022-01-17 DIAGNOSIS — B370 Candidal stomatitis: Secondary | ICD-10-CM

## 2022-01-17 MED ORDER — NYSTATIN 100000 UNIT/ML MT SUSP: 5.0000 mL | Freq: Four times a day (QID) | OROMUCOSAL | 0 refills | Status: DC

## 2022-01-17 NOTE — Progress Notes (Signed)
Subjective:    Patient ID: Claire [name], female    DOB: 06-04-1951, 70 y.o.   MRN: [id number]  HPI Pt is a 71 yo female with severe COPD and chronic respiratory failure with hypoxia, and severe malnourishment who is a former smoker of 2 weeks here to follow up after hospital discharge. Daughter accompanies her to visit.   She is not on nicotine patches.   Pt was admited to 01/06/2022 and discharged on 01/10/2022 for cavitary pneumonia. She is on 4 weeks of augmentin. She is scheduled to see Dr. Loanne Drilling on 2/15 for follow up. She is "stable" today. She denies any increase of SOB. She is on 3L of continuous O2.  She has no appetite. Today she had 2 bites of bisquit, grapes, 1/2 orange, and some potato.   Pallative care comes for evaluation March 13th.   She does have some mouth irritation and "white stuff" at the back and wants it checked out.   .. Active Ambulatory Problems    Diagnosis Date Noted   PTSD (post-traumatic stress disorder) 11/10/2011   Neurosis, anxiety, panic type 11/10/2011   Acquired hypothyroidism 11/24/2014   BP (high blood pressure) 07/24/2012   Chronic use of benzodiazepine for therapeutic purpose 08/11/2015   Hyponatremia 12/19/2016   Hyperkalemia 12/19/2016   Centrilobular emphysema ([place]) 12/20/2016   Stage 3a chronic kidney disease ([place]) 01/16/2017   Iron deficiency anemia 01/16/2017   Anemia of chronic disease 05/11/2017   Low serum vitamin B12 05/14/2017   Vitamin D deficiency 05/14/2017   Depressed mood 05/14/2017   High serum parathyroid hormone (PTH) 07/19/2017   Normocytic anemia 07/19/2017   DOE (dyspnea on exertion) 05/09/2018   Recurrent UTI 05/07/2019   Leg cramps 05/29/2019   No energy 05/29/2019   History of hypokalemia 10/09/2019   Vaccine counseling 12/24/2019   Menopausal vaginal dryness 12/24/2019   Chronic rhinitis 01/31/2020   Chronic respiratory failure with hypoxia ([place]) 01/31/2020   Unintended weight loss 02/19/2020   Macrocytic  anemia 03/10/2020   Elevated ferritin 03/10/2020   Irritable bowel syndrome with diarrhea 03/10/2020   Anxiety 03/23/2020   Pernicious anemia 03/23/2020   Malnutrition of mild degree ([place]) 05/06/2020   Body mass index (BMI) of 19 or less in adult 05/08/2020   Constipation 07/13/2020   Lower extremity edema 07/29/2020   Clostridium difficile colitis 09/11/2020   Mild protein-calorie malnutrition ([place]) 01/19/2021   Underweight 01/22/2021   History of Clostridioides difficile colitis 02/22/2021   Medication management 03/15/2021   Cloudy urine 04/23/2021   Stage 3b chronic kidney disease ([place]) 04/23/2021   Acute cystitis with hematuria 04/26/2021   History of UTI 06/02/2021   Nasal congestion 08/06/2021   Stress due to family tension 09/02/2021   Current smoker 11/03/2021   Weakness 12/31/2021   Community acquired pneumonia of left upper lobe of lung 01/03/2022   Severe malnutrition ([place]) 01/03/2022   AKI (acute kidney injury) ([place]) 01/03/2022   Cavitary pneumonia 01/06/2022   COPD (chronic obstructive pulmonary disease) ([place]) 01/07/2022   Hypomagnesemia 01/07/2022   Tobacco use 01/07/2022   Protein-calorie malnutrition, severe ([place]) 01/07/2022   Physical debility 01/07/2022   Thrush 01/19/2022   Resolved Ambulatory Problems    Diagnosis Date Noted   Hypothyroid    Acute renal failure ([place]) 12/19/2016   Decreased hemoglobin 12/20/2016   Acute kidney injury ([place]) 03/17/2017   Sinobronchitis 06/16/2020   Past Medical History:  Diagnosis Date   Acid reflux    High blood pressure  Review of Systems    See HPI.  Objective:   Physical Exam Vitals reviewed.  Constitutional:      Appearance: She is ill-appearing.     Comments: Fraile appearance in wheelchair with labored breathing  HENT:     Mouth/Throat:     Mouth: Mucous membranes are dry.     Pharynx: Posterior oropharyngeal erythema present. No oropharyngeal exudate.     Comments: Some scant appearance  of white plaque left buccal mucosa Cardiovascular:     Rate and Rhythm: Regular rhythm. Tachycardia present.     Pulses: Normal pulses.  Pulmonary:     Comments: Pursed breathing on 3L of O2 in office.  Diminished breath sounds Some crackles in the left upper lung.  Abdominal:     General: There is no distension.     Tenderness: There is no abdominal tenderness. There is no right CVA tenderness or left CVA tenderness.  Musculoskeletal:     Right lower leg: No edema.     Left lower leg: No edema.  Lymphadenopathy:     Cervical: No cervical adenopathy.  Neurological:     Comments: Decreased lower extremity strength  Psychiatric:     Comments: anxious          Assessment & Plan:  [name] KitchenMarland KitchenZayliana was seen today for follow-up.  Diagnoses and all orders for this visit:  Cavitary pneumonia -     CBC w/Diff/Platelet -     BASIC METABOLIC PANEL WITH GFR -     Hepatic function panel  Chronic respiratory failure with hypoxia (HCC) -     CBC w/Diff/Platelet -     BASIC METABOLIC PANEL WITH GFR -     Hepatic function panel  Protein-calorie malnutrition, severe (HCC) -     CBC w/Diff/Platelet -     BASIC METABOLIC PANEL WITH GFR -     Hepatic function panel  Hyponatremia -     BASIC METABOLIC PANEL WITH GFR  Thrush -     nystatin (MYCOSTATIN) 100000 UNIT/ML suspension; Take 5 mLs (500,000 Units total) by mouth 4 (four) times daily.   Recheck labs today from hospital.  Vitals stable today Keep appt with pulmonologist this week.  Stay on augmentin Stay away of cigarettes.  Nystatin mouth wash to use for mouth irritation and thrush Discussed nutrition and if continued not to eat to survive would need a feeding tube. Pt declined.  She does not have end of life decision made HO given for patient and daughter to look at.  Oxygen is falling off at night. Consider new mask to discuss with company for night time.    Spent 30 minutes reviewing chart, discussing plan and counseling  on nutrition and next steps.

## 2022-01-18 ENCOUNTER — Ambulatory Visit (INDEPENDENT_AMBULATORY_CARE_PROVIDER_SITE_OTHER): Payer: Medicare Other | Admitting: Pulmonary Disease

## 2022-01-18 ENCOUNTER — Ambulatory Visit (INDEPENDENT_AMBULATORY_CARE_PROVIDER_SITE_OTHER): Payer: Medicare Other

## 2022-01-18 ENCOUNTER — Encounter: Payer: Self-pay | Admitting: Pulmonary Disease

## 2022-01-18 VITALS — BP 110/60 | Temp 86.0°F

## 2022-01-18 DIAGNOSIS — I10 Essential (primary) hypertension: Secondary | ICD-10-CM | POA: Diagnosis not present

## 2022-01-18 DIAGNOSIS — R918 Other nonspecific abnormal finding of lung field: Secondary | ICD-10-CM | POA: Diagnosis not present

## 2022-01-18 DIAGNOSIS — D649 Anemia, unspecified: Secondary | ICD-10-CM | POA: Diagnosis not present

## 2022-01-18 DIAGNOSIS — D638 Anemia in other chronic diseases classified elsewhere: Secondary | ICD-10-CM | POA: Diagnosis not present

## 2022-01-18 DIAGNOSIS — Z681 Body mass index (BMI) 19 or less, adult: Secondary | ICD-10-CM | POA: Diagnosis not present

## 2022-01-18 DIAGNOSIS — J9612 Chronic respiratory failure with hypercapnia: Secondary | ICD-10-CM | POA: Diagnosis not present

## 2022-01-18 DIAGNOSIS — J432 Centrilobular emphysema: Secondary | ICD-10-CM | POA: Diagnosis not present

## 2022-01-18 DIAGNOSIS — E039 Hypothyroidism, unspecified: Secondary | ICD-10-CM | POA: Diagnosis not present

## 2022-01-18 DIAGNOSIS — Z87891 Personal history of nicotine dependence: Secondary | ICD-10-CM | POA: Diagnosis not present

## 2022-01-18 DIAGNOSIS — J984 Other disorders of lung: Secondary | ICD-10-CM

## 2022-01-18 DIAGNOSIS — Z9981 Dependence on supplemental oxygen: Secondary | ICD-10-CM | POA: Diagnosis not present

## 2022-01-18 DIAGNOSIS — Z66 Do not resuscitate: Secondary | ICD-10-CM | POA: Diagnosis not present

## 2022-01-18 DIAGNOSIS — E43 Unspecified severe protein-calorie malnutrition: Secondary | ICD-10-CM | POA: Diagnosis not present

## 2022-01-18 DIAGNOSIS — J9611 Chronic respiratory failure with hypoxia: Secondary | ICD-10-CM | POA: Diagnosis not present

## 2022-01-18 DIAGNOSIS — J439 Emphysema, unspecified: Secondary | ICD-10-CM | POA: Diagnosis not present

## 2022-01-18 LAB — HEPATIC FUNCTION PANEL
AG Ratio: 0.5 (calc) — ABNORMAL LOW (ref 1.0–2.5)
ALT: 6 U/L (ref 6–29)
AST: 11 U/L (ref 10–35)
Albumin: 2.6 g/dL — ABNORMAL LOW (ref 3.6–5.1)
Alkaline phosphatase (APISO): 118 U/L (ref 37–153)
Bilirubin, Direct: 0.1 mg/dL (ref 0.0–0.2)
Globulin: 4.9 g/dL (calc) — ABNORMAL HIGH (ref 1.9–3.7)
Indirect Bilirubin: 0.3 mg/dL (calc) (ref 0.2–1.2)
Total Bilirubin: 0.4 mg/dL (ref 0.2–1.2)
Total Protein: 7.5 g/dL (ref 6.1–8.1)

## 2022-01-18 LAB — BASIC METABOLIC PANEL WITH GFR
BUN: 13 mg/dL (ref 7–25)
CO2: 42 mmol/L — ABNORMAL HIGH (ref 20–32)
Calcium: 9.1 mg/dL (ref 8.6–10.4)
Chloride: 87 mmol/L — ABNORMAL LOW (ref 98–110)
Creat: 0.81 mg/dL (ref 0.60–1.00)
Glucose, Bld: 104 mg/dL — ABNORMAL HIGH (ref 65–99)
Potassium: 5.2 mmol/L (ref 3.5–5.3)
Sodium: 135 mmol/L (ref 135–146)
eGFR: 78 mL/min/{1.73_m2} (ref >=60)

## 2022-01-18 LAB — CBC WITH DIFFERENTIAL/PLATELET
Absolute Monocytes: 1489 cells/uL — ABNORMAL HIGH (ref 200–950)
Basophils Absolute: 44 cells/uL (ref 0–200)
Basophils Absolute: 0.1 10*3/uL (ref 0.0–0.1)
Basophils Relative: 0.3 %
Basophils Relative: 0.5 % (ref 0.0–3.0)
Eosinophils Absolute: 234 cells/uL (ref 15–500)
Eosinophils Absolute: 0.3 10*3/uL (ref 0.0–0.7)
Eosinophils Relative: 1.6 %
Eosinophils Relative: 2.6 % (ref 0.0–5.0)
HCT: 22.3 % — ABNORMAL LOW (ref 35.0–45.0)
HCT: 20.7 % — CL (ref 36.0–46.0)
Hemoglobin: 7.2 g/dL — ABNORMAL LOW (ref 11.7–15.5)
Hemoglobin: 6.8 g/dL — CL (ref 12.0–15.0)
Lymphocytes Relative: 8 % — ABNORMAL LOW (ref 12.0–46.0)
Lymphs Abs: 847 cells/uL — ABNORMAL LOW (ref 850–3900)
Lymphs Abs: 0.8 10*3/uL (ref 0.7–4.0)
MCH: 30.3 pg (ref 27.0–33.0)
MCHC: 32.3 g/dL (ref 32.0–36.0)
MCHC: 33 g/dL (ref 30.0–36.0)
MCV: 93.7 fL (ref 80.0–100.0)
MCV: 94.7 fl (ref 78.0–100.0)
MPV: 9.1 fL (ref 7.5–12.5)
Monocytes Absolute: 1.2 10*3/uL — ABNORMAL HIGH (ref 0.1–1.0)
Monocytes Relative: 10.2 %
Monocytes Relative: 11.2 % (ref 3.0–12.0)
Neutro Abs: 11987 cells/uL — ABNORMAL HIGH (ref 1500–7800)
Neutro Abs: 8.1 10*3/uL — ABNORMAL HIGH (ref 1.4–7.7)
Neutrophils Relative %: 82.1 %
Neutrophils Relative %: 77.7 % — ABNORMAL HIGH (ref 43.0–77.0)
Platelets: 496 10*3/uL — ABNORMAL HIGH (ref 140–400)
Platelets: 440 10*3/uL — ABNORMAL HIGH (ref 150.0–400.0)
RBC: 2.38 10*6/uL — ABNORMAL LOW (ref 3.80–5.10)
RBC: 2.18 Mil/uL — ABNORMAL LOW (ref 3.87–5.11)
RDW: 12.3 % (ref 11.0–15.0)
RDW: 14.6 % (ref 11.5–15.5)
Total Lymphocyte: 5.8 %
WBC: 14.6 10*3/uL — ABNORMAL HIGH (ref 3.8–10.8)
WBC: 10.4 10*3/uL (ref 4.0–10.5)

## 2022-01-18 MED ORDER — PREDNISONE 10 MG PO TABS: 30.0000 mg | ORAL_TABLET | Freq: Every day | ORAL | 0 refills | Status: AC

## 2022-01-18 MED ORDER — PREDNISONE 10 MG PO TABS: 30.0000 mg | ORAL_TABLET | Freq: Every day | ORAL | 0 refills | Status: DC

## 2022-01-18 MED ORDER — BREZTRI AEROSPHERE 160-9-4.8 MCG/ACT IN AERO: 2.0000 | INHALATION_SPRAY | Freq: Two times a day (BID) | RESPIRATORY_TRACT | 5 refills | Status: DC

## 2022-01-18 NOTE — Progress Notes (Signed)
Subjective:   PATIENT ID: Claire [name] GENDER: female DOB: 1951/11/14, MRN: [id number]   HPI  Chief Complaint  Patient presents with   Hospitalization Follow-up    Cough and weakness    Reason for Visit: Hospital Follow-up   Ms. Claire [name] is a 71 year old female active smoker with emphysema, chronic hypoxemic respiratory failure, history of TB exposure hypertension who presents for hospital follow-up for emphysema and cavitary lung lesions.  She was hospitalized from 01/07/2022-01/10/22 after presenting for shortness of breath and productive cough for greater than 7 days and failing outpatient therapy.  She also had a recent 19 pound weight loss in the last 3 months. She was treated with IV antibiotics and transition to PO Augmentin.  Her daughter reports she is more confused. Since hospital discharge she is overall worsened with increased fatigue, energy and sleeping >12 hours a day. Her daughter is concerned that the antibiotics are not as effective and requesting alternative options including home IV antibiotics. She reports shortness of breath, wheezing and cough.   Social History: Former smoker  I have personally reviewed patient's past medical/family/social history, allergies, current medications.  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    Comment: retired  Tobacco Use   Smoking status: Every Day    Packs/day: 1.00    Years: 55.00    Pack years: 55.00    Types: Cigarettes   Smokeless tobacco: Never   Tobacco comments:    Stopped smoking 2 wks ago   Vaping Use   Vaping Use: Never used  Substance and Sexual Activity   Alcohol use: No   Drug use: No   Sexual activity: Not Currently    Allergies  Allergen Reactions   Bactrim [Sulfamethoxazole-Trimethoprim]     ACUTE RENAL fAILURE/HYPONATREMIA/HYPERKALEMIA   Macrodantin [Nitrofurantoin Macrocrystal] Rash   Medrol [Methylprednisolone]     Hallucinations   Trimethoprim     Fatigue/weakness/no appetite   Zithromax [Azithromycin]     diarrhea     Outpatient Medications Prior to Visit  Medication Sig Dispense Refill   albuterol (VENTOLIN HFA) 108 (90 Base) MCG/ACT inhaler INHALE 1-2 PUFFS INTO THE LUNGS EVERY 2 HOURS AS NEEDED FOR WHEEZING OR SHORTNESS OF BREATH. (Patient taking differently: Inhale 1-2 puffs into the lungs every 2 (two) hours as needed for wheezing or shortness of breath.) 25.5 g 1   AMBULATORY NON FORMULARY MEDICATION Continuous stationary and portable oxygen tanks for use with ambulation. DX: COPD with hypoxia. 4 Device 11   AMBULATORY NON FORMULARY MEDICATION Medication Name: full face mask to use with oxygen 1 each 2   amoxicillin-clavulanate (AUGMENTIN) 875-125 MG tablet Take 1 tablet by mouth 2 (two) times daily for 28 days. 56 tablet 0   atenolol (TENORMIN) 25 MG tablet Take 1 tablet (25 mg total) by mouth 2 (two) times daily. (Patient taking differently: Take 50 mg by mouth 2 (two) times daily.) 180 tablet 0   cetirizine (ZYRTEC) 10 MG chewable tablet Chew  10 mg by mouth daily.     cholecalciferol (VITAMIN D) 25 MCG (1000 UT) tablet Take 1,000 Units by mouth daily.     clonazePAM (KLONOPIN) 0.5 MG tablet Take 1 tablet (0.5 mg total) by mouth 3 (three) times daily. 90 tablet 1   dicyclomine (BENTYL) 10 MG capsule Take 10-20 mg by mouth in the morning and at bedtime.     DULERA 100-5 MCG/ACT AERO TAKE 2 PUFFS BY MOUTH TWICE A DAY (Patient taking differently: Inhale 2 puffs into the lungs in the morning and at bedtime.) 13 g 6   estradiol (ESTRACE) 0.1 MG/GM  vaginal cream PLACE ONE APPLICATOR FULL THREE TIMES A WEEK AND ONE PEA SIZED AMOUNT APPLIED TO URETHRAL OPENING (Patient taking differently: Place 1 Applicatorful vaginally 3 (three) times a week.) 42.5 g 0   famotidine (PEPCID) 20 MG tablet TAKE 1 TABLET BY MOUTH TWICE A DAY (Patient taking differently: Take 20 mg by mouth daily as needed for heartburn or indigestion.) 180 tablet 1   ferrous sulfate 325 (65 FE) MG EC tablet 1 tab PO TID every other day (Patient taking differently: Take 325 mg by mouth See admin instructions. 1 t TID every other day) 90 tablet 11   ipratropium (ATROVENT) 0.06 % nasal spray Place 2 sprays into both nostrils 4 (four) times daily. 30 mL 0   ipratropium-albuterol (DUONEB) 0.5-2.5 (3) MG/3ML SOLN Take 3 mLs by nebulization every 4 (four) hours as needed (wheeze, SOB). 540 mL 1   Lactobacillus (FLORAJEN ACIDOPHILUS) CAPS Take 1 capsule by mouth daily.     levothyroxine (SYNTHROID) 75 MCG tablet Take 1 tablet (75 mcg total) by mouth daily. 90 tablet 3   nystatin (MYCOSTATIN) 100000 UNIT/ML suspension Take 5 mLs (500,000 Units total) by mouth 4 (four) times daily. 60 mL 0   SPIRIVA RESPIMAT 2.5 MCG/ACT AERS Inhale 2 puffs into the lungs daily. (Patient taking differently: Inhale 2 puffs into the lungs daily.) 12 g 1   No facility-administered medications prior to visit.    Review of Systems  Constitutional:  Positive for malaise/fatigue. Negative for chills, diaphoresis, fever and weight loss.  HENT:  Negative for congestion.   Respiratory:  Positive for cough, shortness of breath and wheezing. Negative for hemoptysis and sputum production.   Cardiovascular:  Negative for chest pain, palpitations and leg swelling.    Objective:   Vitals:   01/18/22 1536  BP: 110/60  Temp: (!) 86 F (30 C)  SpO2: 94%   SpO2: 94 % (3L pulse) O2 Device: Nasal cannula O2 Flow Rate (L/min): 3 L/min O2 Type: Pulse O2  Physical Exam: General: Pale, critically ill-appearing, no  acute distress HENT: [place], AT Eyes: EOMI, no scleral icterus Respiratory: Diminished air entry bilaterally.  No crackles, wheezing or rales Cardiovascular: RRR, -M/R/G, no JVD Extremities:-Edema,-tenderness Neuro: AAO x 2, CNII-XII grossly intact Psych: Normal mood, normal affect  Data Reviewed:  Imaging: CT Chest 01/06/22 reviewed with severe bullous emphysema with upper lobe predominance, LUL consolidation with ?cavitation vs bullae and multiple thick walled cavities in LLL including in superior segment measuring 6.8 x 3.4 and 1.4x1.6x4.6 cm. Associated ground glass airspace opacities.   PFT: None on file  Labs: CBC    Component Value Date/Time   WBC 14.6 (H) 01/17/2022 0000   RBC 2.38 (L) 01/17/2022 0000   HGB 7.2 (L) 01/17/2022 0000   HGB 8.3 (L) 10/05/2021 1128   HCT 22.3 (L) 01/17/2022 0000   HCT 24.6 (L) 10/05/2021 1128   PLT  496 (H) 01/17/2022 0000   PLT 449 10/05/2021 1128   MCV 93.7 01/17/2022 0000   MCV 93 10/05/2021 1128   MCV 96.7 06/12/2017 0000   MCH 30.3 01/17/2022 0000   MCHC 32.3 01/17/2022 0000   RDW 12.3 01/17/2022 0000   RDW 10.9 (L) 10/05/2021 1128   LYMPHSABS 847 (L) 01/17/2022 0000   LYMPHSABS 1.4 04/27/2021 1500   MONOABS 1.2 (H) 01/08/2022 1003   EOSABS 234 01/17/2022 0000   EOSABS 0.2 04/27/2021 1500   BASOSABS 44 01/17/2022 0000   BASOSABS 0.0 04/27/2021 1500   Absolute eos 01/17/22     Assessment & Plan:   Discussion: 71 year old female active smoker with emphysema, chronic hypoxemic respiratory failure, history of TB exposure hypertension who presents for hospital follow-up.  If cavitary lung lesions persistent on repeat imaging, will need to consider bronchoscopy for tissue sampling. Will complete four weeks of PO Augmentin on 02/07/22.   Cavitary lung lesions --We will complete antibiotics on 02/07/22 --ORDER CT chest with contrast the week of March 6-10  COPD exacerbation Bullous emphysema Chronic hypoxemic respiratory failure --STOP  Dulera --STOP Spiriva --START Breztri TWO puffs TWICE a day --CONTINUE Duoneb nebulizer THREE times a day --CONTINUE oxygen 2L with rest and 3L with activity and sleep  Elevated white count Confusion ORDER labs: CBC with diff, BMET and VBG  If she develops new fever, worsening shortness of breath, cough, wheezing or confusion, go to the ER evaluation.   ADDENDUM: Hg returned 6.8. Discussed with daughter regarding patient's altered mental status, worsening CXR findings, failure to thrive and anemia, recommend ED evaluation and admission. Will also need to address GOC as patient was previously wanting DNR status (no intubation, no chest compression) however daughter wished for her mother to re-consider.  Health Maintenance Immunization History  Administered Date(s) Administered   Influenza,inj,Quad PF,6+ Mos 09/26/2018   Pneumococcal Polysaccharide-23 11/05/2018   Tdap 12/05/2010   CT Lung Screen - not currently appropriate in setting of active infection and serial chest imaging  Orders Placed This Encounter  Procedures   CBC w/Diff    Standing Status:   Future    Standing Expiration Date:   01/18/2023   Basic Metabolic Panel (BMET)    Standing Status:   Future    Standing Expiration Date:   01/18/2023   Blood Gas, Venous  No orders of the defined types were placed in this encounter.   No follow-ups on file.  I have spent a total time of 60-minutes on the day of the appointment reviewing prior documentation, coordinating care and discussing medical diagnosis and plan with the patient/family. Imaging, labs and tests included in this note have been reviewed and interpreted independently by me.  Marsha Gundlach Mechele Collin, MD [facility] 01/18/2022 4:16 PM  Office Number [phone]

## 2022-01-18 NOTE — Telephone Encounter (Signed)
[name] daughter is returning phone call. [name] phone number is [phone].

## 2022-01-18 NOTE — Progress Notes (Signed)
Kidney function looks amazing.  Sodium looks great.  Calcium normal again.  Liver enzymes look good.  Your labs show severe malnourishment White count elevated showing fighting off infection.

## 2022-01-18 NOTE — Telephone Encounter (Signed)
Appt scheduled for today at 3:30 with Dr Loanne Drilling.

## 2022-01-18 NOTE — Telephone Encounter (Signed)
Lmtcb for pt's daughter, [name]. Currently there is an opening with Dr. Everardo All this afternoon (30 min slot) if pt wants to be seen sooner, if appt slot is still available when they call back.

## 2022-01-18 NOTE — Patient Instructions (Addendum)
Cavitary lung lesions --We will complete antibiotics on 02/07/22 --ORDER CT chest with contrast the week of March 6-10  COPD exacerbation Bullous emphysema Chronic hypoxemic respiratory failure --STOP Dulera --STOP Spiriva --START Breztri TWO puffs TWICE a day --CONTINUE Duoneb nebulizer THREE times a day --CONTINUE oxygen 2L with rest and 3L with activity and sleep  Elevated white count Confusion ORDER labs: CBC with diff, BMET and VBG  If she develops new fever, worsening shortness of breath, cough, wheezing or confusion, go to the ER evaluation.   Follow-up with me March 13 at 2:30 PM

## 2022-01-19 ENCOUNTER — Encounter: Payer: Self-pay | Admitting: Physician Assistant

## 2022-01-19 DIAGNOSIS — B370 Candidal stomatitis: Secondary | ICD-10-CM | POA: Insufficient documentation

## 2022-01-19 LAB — BASIC METABOLIC PANEL
BUN: 15 mg/dL (ref 6–23)
CO2: 45 mEq/L — ABNORMAL HIGH (ref 19–32)
Calcium: 9.1 mg/dL (ref 8.4–10.5)
Chloride: 86 mEq/L — ABNORMAL LOW (ref 96–112)
Creatinine, Ser: 0.85 mg/dL (ref 0.40–1.20)
GFR: 69.11 mL/min (ref >60.00)
Glucose, Bld: 113 mg/dL — ABNORMAL HIGH (ref 70–99)
Potassium: 4.5 mEq/L (ref 3.5–5.1)
Sodium: 133 mEq/L — ABNORMAL LOW (ref 135–145)

## 2022-01-20 ENCOUNTER — Inpatient Hospital Stay: Payer: Medicare Other | Admitting: Pulmonary Disease

## 2022-01-20 DIAGNOSIS — F609 Personality disorder, unspecified: Secondary | ICD-10-CM | POA: Insufficient documentation

## 2022-01-20 MED ORDER — AMBULATORY NON FORMULARY MEDICATION
0 refills | Status: AC
Start: 2022-01-20 — End: ?

## 2022-01-20 NOTE — Addendum Note (Signed)
Addended bySilvio Pate on: 01/20/2022 10:42 AM   Modules accepted: Orders

## 2022-01-20 NOTE — Progress Notes (Signed)
RX was sent in September for a full face mask for O2, another RX sent to [facility] at [phone] with confirmation received.

## 2022-01-22 DIAGNOSIS — E43 Unspecified severe protein-calorie malnutrition: Secondary | ICD-10-CM | POA: Diagnosis not present

## 2022-01-22 DIAGNOSIS — N179 Acute kidney failure, unspecified: Secondary | ICD-10-CM | POA: Diagnosis not present

## 2022-01-22 DIAGNOSIS — J449 Chronic obstructive pulmonary disease, unspecified: Secondary | ICD-10-CM | POA: Diagnosis not present

## 2022-01-22 DIAGNOSIS — J984 Other disorders of lung: Secondary | ICD-10-CM | POA: Diagnosis not present

## 2022-01-23 DIAGNOSIS — R0902 Hypoxemia: Secondary | ICD-10-CM | POA: Diagnosis not present

## 2022-01-24 ENCOUNTER — Telehealth: Payer: Self-pay | Admitting: Pulmonary Disease

## 2022-01-24 DIAGNOSIS — R269 Unspecified abnormalities of gait and mobility: Secondary | ICD-10-CM | POA: Diagnosis not present

## 2022-01-24 NOTE — Telephone Encounter (Signed)
Called [name] and she states that the [place] is no longer helping and patient wants to go back on [place]. [name] also wanted to know if you wanted her to start taking the prednisone that you prescribed, that she just got out of the hospital and did not start taking it yet.  She also wants to know about her oxygen she is at 1.5L and her o2 levels are at 94%, patient states that she needs to go back to 3L.   Dr Loanne Drilling please advise

## 2022-01-25 ENCOUNTER — Encounter: Payer: Self-pay | Admitting: Physician Assistant

## 2022-01-25 DIAGNOSIS — J449 Chronic obstructive pulmonary disease, unspecified: Secondary | ICD-10-CM | POA: Diagnosis not present

## 2022-01-25 DIAGNOSIS — J984 Other disorders of lung: Secondary | ICD-10-CM | POA: Diagnosis not present

## 2022-01-25 DIAGNOSIS — N179 Acute kidney failure, unspecified: Secondary | ICD-10-CM | POA: Diagnosis not present

## 2022-01-25 DIAGNOSIS — E43 Unspecified severe protein-calorie malnutrition: Secondary | ICD-10-CM | POA: Diagnosis not present

## 2022-01-25 NOTE — Telephone Encounter (Signed)
Ok to stop [employer] and start the following: Dulera 200-5 mcg TWO puffs TWICE a day and Spiriva 2.5 mcg TWO puffs ONCE a day  For her oxygen, those levels are appropriate. We want a target SpO2 of 88-92%. She does not need to go back to 3L based on what she reports. We can recheck an ambulatory O2 at the next visit

## 2022-01-25 NOTE — Telephone Encounter (Signed)
Called and spoke with patient's daughter [name]. She verbalized understanding of recommendations. She stated that she did not need a RX for the [facility] and Spiriva.   While on the phone with [name], she mentioned that her mother has been more confused lately. I asked if she had any UTI symptoms as an UTI can cause confusion. She stated that she did have an UTI last week and is still on the amoxicillin. [name] has been trying to get in touch with the PCP's office since yesterday but no one has called her back. I offered to call the office for her.   I called the PCP's office at [phone] and asked to speak with the triage nurse. No one answered so I had to leave a message for someone to give [name] a call back.   Called [name] back but she did not answer. I left a detailed message to let her know that I had called.   Will leave this encounter open for follow up.

## 2022-01-26 ENCOUNTER — Ambulatory Visit (INDEPENDENT_AMBULATORY_CARE_PROVIDER_SITE_OTHER): Payer: Medicare Other | Admitting: Physician Assistant

## 2022-01-26 ENCOUNTER — Other Ambulatory Visit: Payer: Self-pay

## 2022-01-26 ENCOUNTER — Encounter: Payer: Self-pay | Admitting: Physician Assistant

## 2022-01-26 VITALS — BP 102/69 | HR 84 | Ht 60.0 in | Wt 88.0 lb

## 2022-01-26 DIAGNOSIS — D649 Anemia, unspecified: Secondary | ICD-10-CM

## 2022-01-26 DIAGNOSIS — J984 Other disorders of lung: Secondary | ICD-10-CM | POA: Diagnosis not present

## 2022-01-26 DIAGNOSIS — J432 Centrilobular emphysema: Secondary | ICD-10-CM

## 2022-01-26 DIAGNOSIS — F05 Delirium due to known physiological condition: Secondary | ICD-10-CM

## 2022-01-26 DIAGNOSIS — J189 Pneumonia, unspecified organism: Secondary | ICD-10-CM

## 2022-01-26 DIAGNOSIS — R410 Disorientation, unspecified: Secondary | ICD-10-CM

## 2022-01-26 LAB — POCT URINALYSIS DIP (CLINITEK)
Bilirubin, UA: NEGATIVE
Blood, UA: NEGATIVE
Glucose, UA: NEGATIVE mg/dL
Ketones, POC UA: NEGATIVE mg/dL
Leukocytes, UA: NEGATIVE
Nitrite, UA: NEGATIVE
POC PROTEIN,UA: NEGATIVE
Spec Grav, UA: 1.015 (ref 1.010–1.025)
Urobilinogen, UA: 0.2 E.U./dL
pH, UA: 6.5 (ref 5.0–8.0)

## 2022-01-26 NOTE — Progress Notes (Signed)
Subjective:    Patient ID: Claire [name], female    DOB: 1951-06-22, 71 y.o.   MRN: [id number]  HPI Pt is a 71 yo female with cavitary pneumonia and severe COPD, anemia who presents to the clinic for follow up.   Pt is doing some better. She is on 2L of O2 continuously. She is seeing Dr. Loanne Drilling at pulmonology. She has stopped smoking. She continues augmentin and albuterol with nebulizer at least 2-3 times a day. Full O2 mask that was sent was too big to fit her face. She is concerned about taking augmentin due to hx of C.diff. her stools are mushy but only once a day.   Pt had to go to ED on 2/14 for blood transfusion because hemoglobin dropped to 6.5. she is back on oral iron. She denies any known blood loss.   Daughter is concerned with some mood changes and confusion. The confusion usually happens at night. Her mother does not remember the conversations or how confused and irritable she gets. She is on klonapin three times a day right now for anxiety and panic attacks. She sees Dr. Pricilla Handler, downstairs for mood. Daughter wonders about UTI. No fever, chills, urinary symptoms. No symptoms except confusion at night.   .. Active Ambulatory Problems    Diagnosis Date Noted   PTSD (post-traumatic stress disorder) 11/10/2011   Neurosis, anxiety, panic type 11/10/2011   Acquired hypothyroidism 11/24/2014   BP (high blood pressure) 07/24/2012   Chronic use of benzodiazepine for therapeutic purpose 08/11/2015   Hyponatremia 12/19/2016   Hyperkalemia 12/19/2016   Centrilobular emphysema ([place]) 12/20/2016   Stage 3a chronic kidney disease ([place]) 01/16/2017   Iron deficiency anemia 01/16/2017   Anemia of chronic disease 05/11/2017   Low serum vitamin B12 05/14/2017   Vitamin D deficiency 05/14/2017   Depressed mood 05/14/2017   High serum parathyroid hormone (PTH) 07/19/2017   Normocytic anemia 07/19/2017   DOE (dyspnea on exertion) 05/09/2018   Recurrent UTI 05/07/2019   Leg cramps 05/29/2019    No energy 05/29/2019   History of hypokalemia 10/09/2019   Vaccine counseling 12/24/2019   Menopausal vaginal dryness 12/24/2019   Chronic rhinitis 01/31/2020   Chronic respiratory failure with hypoxia ([place]) 01/31/2020   Unintended weight loss 02/19/2020   Macrocytic anemia 03/10/2020   Elevated ferritin 03/10/2020   Irritable bowel syndrome with diarrhea 03/10/2020   Anxiety 03/23/2020   Pernicious anemia 03/23/2020   Malnutrition of mild degree ([place]) 05/06/2020   Body mass index (BMI) of 19 or less in adult 05/08/2020   Constipation 07/13/2020   Lower extremity edema 07/29/2020   Clostridium difficile colitis 09/11/2020   Mild protein-calorie malnutrition ([place]) 01/19/2021   Underweight 01/22/2021   History of Clostridioides difficile colitis 02/22/2021   Medication management 03/15/2021   Cloudy urine 04/23/2021   Stage 3b chronic kidney disease ([place]) 04/23/2021   Acute cystitis with hematuria 04/26/2021   History of UTI 06/02/2021   Nasal congestion 08/06/2021   Stress due to family tension 09/02/2021   Current smoker 11/03/2021   Weakness 12/31/2021   Community acquired pneumonia of left upper lobe of lung 01/03/2022   Severe malnutrition ([place]) 01/03/2022   AKI (acute kidney injury) ([place]) 01/03/2022   Cavitary pneumonia 01/06/2022   COPD (chronic obstructive pulmonary disease) ([place]) 01/07/2022   Hypomagnesemia 01/07/2022   Tobacco use 01/07/2022   Protein-calorie malnutrition, severe ([place]) 01/07/2022   Physical debility 01/07/2022   Thrush 01/19/2022   Resolved Ambulatory Problems    Diagnosis  Date Noted   Hypothyroid    Acute renal failure ([place]) 12/19/2016   Decreased hemoglobin 12/20/2016   Acute kidney injury ([place]) 03/17/2017   Sinobronchitis 06/16/2020   Past Medical History:  Diagnosis Date   Acid reflux    High blood pressure        Review of Systems See HPI.     Objective:   Physical Exam Vitals reviewed.  Constitutional:      Appearance:  She is ill-appearing.     Comments: Frail and in wheelchair  HENT:     Head: Normocephalic.  Cardiovascular:     Rate and Rhythm: Normal rate and regular rhythm.     Pulses: Normal pulses.     Heart sounds: Normal heart sounds.  Pulmonary:     Comments: Labored breathing with coarse crackles through both lungs but worse over left upper lobe.  On 2L of O2.  Musculoskeletal:     Right lower leg: No edema.     Left lower leg: No edema.  Neurological:     General: No focal deficit present.     Mental Status: She is alert and oriented to person, place, and time.     Motor: Weakness present.  Psychiatric:        Mood and Affect: Mood normal.     .. Results for orders placed or performed in visit on 01/26/22  Urine Culture   Specimen: Urine  Result Value Ref Range   MICRO NUMBER: [id number]    SPECIMEN QUALITY: Adequate    Sample Source NOT GIVEN    STATUS: FINAL    Result: No Growth   CBC w/Diff/Platelet  Result Value Ref Range   WBC 12.8 (H) 3.8 - 10.8 Thousand/uL   RBC 2.79 (L) 3.80 - 5.10 Million/uL   Hemoglobin 8.3 (L) 11.7 - 15.5 g/dL   HCT 25.9 (L) 35.0 - 45.0 %   MCV 92.8 80.0 - 100.0 fL   MCH 29.7 27.0 - 33.0 pg   MCHC 32.0 32.0 - 36.0 g/dL   RDW 12.4 11.0 - 15.0 %   Platelets 545 (H) 140 - 400 Thousand/uL   MPV 9.1 7.5 - 12.5 fL   Neutro Abs 9,920 (H) 1,500 - 7,800 cells/uL   Lymphs Abs 1,088 850 - 3,900 cells/uL   Absolute Monocytes 1,344 (H) 200 - 950 cells/uL   Eosinophils Absolute 371 15 - 500 cells/uL   Basophils Absolute 77 0 - 200 cells/uL   Neutrophils Relative % 77.5 %   Total Lymphocyte 8.5 %   Monocytes Relative 10.5 %   Eosinophils Relative 2.9 %   Basophils Relative 0.6 %  Fe+TIBC+Fer  Result Value Ref Range   Iron 12 (L) 45 - 160 mcg/dL   TIBC 133 (L) 250 - 450 mcg/dL (calc)   %SAT 9 (L) 16 - 45 % (calc)   Ferritin 409 (H) 16 - 288 ng/mL  BASIC METABOLIC PANEL WITH GFR  Result Value Ref Range   Glucose, Bld 87 65 - 99 mg/dL   BUN 22 7 -  25 mg/dL   Creat 0.96 0.60 - 1.00 mg/dL   eGFR 63 > OR = 60 mL/min/1.53m   BUN/Creatinine Ratio NOT APPLICABLE 6 - 22 (calc)   Sodium 131 (L) 135 - 146 mmol/L   Potassium 4.6 3.5 - 5.3 mmol/L   Chloride 90 (L) 98 - 110 mmol/L   CO2 34 (H) 20 - 32 mmol/L   Calcium 8.9 8.6 - 10.4 mg/dL  POCT URINALYSIS DIP (CLINITEK)  Result Value Ref Range   Color, UA yellow yellow   Clarity, UA clear clear   Glucose, UA negative negative mg/dL   Bilirubin, UA negative negative   Ketones, POC UA negative negative mg/dL   Spec Grav, UA 1.015 1.010 - 1.025   Blood, UA negative negative   pH, UA 6.5 5.0 - 8.0   POC PROTEIN,UA negative negative, trace   Urobilinogen, UA 0.2 0.2 or 1.0 E.U./dL   Nitrite, UA Negative Negative   Leukocytes, UA Negative Negative        Assessment & Plan:  Claire [name] KitchenMarland KitchenKhaleelah was seen today for follow-up.  Diagnoses and all orders for this visit:  Cavitary pneumonia -     CBC w/Diff/Platelet -     Fe+TIBC+Fer -     BASIC METABOLIC PANEL WITH GFR  Centrilobular emphysema (HCC)  Anemia, unspecified type -     CBC w/Diff/Platelet -     Fe+TIBC+Fer  Confusion -     POCT URINALYSIS DIP (CLINITEK) -     Urine Culture -     Ambulatory referral to Neurology  Sundowning -     Ambulatory referral to Neurology   Follow up with pulmonology/BH.  UA negative today. Will culture.  Likely confusion some sundowning.  Daughter would like referral to neurology.  Will call GI about medication they previously wanted to start but pt was scared to start. Will look into it for her.

## 2022-01-26 NOTE — Telephone Encounter (Signed)
Patient scheduled.

## 2022-01-27 DIAGNOSIS — J984 Other disorders of lung: Secondary | ICD-10-CM | POA: Diagnosis not present

## 2022-01-27 DIAGNOSIS — J449 Chronic obstructive pulmonary disease, unspecified: Secondary | ICD-10-CM | POA: Diagnosis not present

## 2022-01-27 DIAGNOSIS — E43 Unspecified severe protein-calorie malnutrition: Secondary | ICD-10-CM | POA: Diagnosis not present

## 2022-01-27 DIAGNOSIS — N179 Acute kidney failure, unspecified: Secondary | ICD-10-CM | POA: Diagnosis not present

## 2022-01-27 LAB — BASIC METABOLIC PANEL WITH GFR
BUN: 22 mg/dL (ref 7–25)
CO2: 34 mmol/L — ABNORMAL HIGH (ref 20–32)
Calcium: 8.9 mg/dL (ref 8.6–10.4)
Chloride: 90 mmol/L — ABNORMAL LOW (ref 98–110)
Creat: 0.96 mg/dL (ref 0.60–1.00)
Glucose, Bld: 87 mg/dL (ref 65–99)
Potassium: 4.6 mmol/L (ref 3.5–5.3)
Sodium: 131 mmol/L — ABNORMAL LOW (ref 135–146)
eGFR: 63 mL/min/{1.73_m2} (ref >=60)

## 2022-01-27 LAB — CBC WITH DIFFERENTIAL/PLATELET
Absolute Monocytes: 1344 cells/uL — ABNORMAL HIGH (ref 200–950)
Basophils Absolute: 77 cells/uL (ref 0–200)
Basophils Relative: 0.6 %
Eosinophils Absolute: 371 cells/uL (ref 15–500)
Eosinophils Relative: 2.9 %
HCT: 25.9 % — ABNORMAL LOW (ref 35.0–45.0)
Hemoglobin: 8.3 g/dL — ABNORMAL LOW (ref 11.7–15.5)
Lymphs Abs: 1088 cells/uL (ref 850–3900)
MCH: 29.7 pg (ref 27.0–33.0)
MCHC: 32 g/dL (ref 32.0–36.0)
MCV: 92.8 fL (ref 80.0–100.0)
MPV: 9.1 fL (ref 7.5–12.5)
Monocytes Relative: 10.5 %
Neutro Abs: 9920 cells/uL — ABNORMAL HIGH (ref 1500–7800)
Neutrophils Relative %: 77.5 %
Platelets: 545 10*3/uL — ABNORMAL HIGH (ref 140–400)
RBC: 2.79 10*6/uL — ABNORMAL LOW (ref 3.80–5.10)
RDW: 12.4 % (ref 11.0–15.0)
Total Lymphocyte: 8.5 %
WBC: 12.8 10*3/uL — ABNORMAL HIGH (ref 3.8–10.8)

## 2022-01-27 LAB — IRON,TIBC AND FERRITIN PANEL
%SAT: 9 % (calc) — ABNORMAL LOW (ref 16–45)
Ferritin: 409 ng/mL — ABNORMAL HIGH (ref 16–288)
Iron: 12 ug/dL — ABNORMAL LOW (ref 45–160)
TIBC: 133 mcg/dL (calc) — ABNORMAL LOW (ref 250–450)

## 2022-01-27 NOTE — Progress Notes (Signed)
Stay on oral iron. Hemoglobin is better. Labs are stable.

## 2022-01-28 ENCOUNTER — Telehealth: Payer: Self-pay | Admitting: Neurology

## 2022-01-28 LAB — URINE CULTURE
MICRO NUMBER:: 13046705
Result:: NO GROWTH
SPECIMEN QUALITY:: ADEQUATE

## 2022-01-28 NOTE — Telephone Encounter (Signed)
Truman Hayward, PT with [facility], called [phone]) called stating patient requested PT evaluation to be moved to next week.

## 2022-01-31 ENCOUNTER — Encounter: Payer: Self-pay | Admitting: Physician Assistant

## 2022-01-31 NOTE — Progress Notes (Signed)
Looks like Entocort was given, do you want to message GI or do you want me to call them?

## 2022-01-31 NOTE — Progress Notes (Signed)
No growth on urine culture.

## 2022-01-31 NOTE — Progress Notes (Signed)
Left message with Dr. Gabriela Eves office to see if okay to restart Entocort while on Augmentin for pneumonia treatment. Waiting to hear back.

## 2022-01-31 NOTE — Patient Instructions (Signed)
Follow up mood with BH.  Follow up pneumonia with pulmonology.  Will check labs today for anemia.

## 2022-02-01 DIAGNOSIS — J449 Chronic obstructive pulmonary disease, unspecified: Secondary | ICD-10-CM | POA: Diagnosis not present

## 2022-02-01 DIAGNOSIS — N179 Acute kidney failure, unspecified: Secondary | ICD-10-CM | POA: Diagnosis not present

## 2022-02-01 DIAGNOSIS — J984 Other disorders of lung: Secondary | ICD-10-CM | POA: Diagnosis not present

## 2022-02-01 DIAGNOSIS — E43 Unspecified severe protein-calorie malnutrition: Secondary | ICD-10-CM | POA: Diagnosis not present

## 2022-02-03 DIAGNOSIS — D510 Vitamin B12 deficiency anemia due to intrinsic factor deficiency: Secondary | ICD-10-CM | POA: Diagnosis not present

## 2022-02-03 DIAGNOSIS — J9611 Chronic respiratory failure with hypoxia: Secondary | ICD-10-CM | POA: Diagnosis not present

## 2022-02-03 DIAGNOSIS — I129 Hypertensive chronic kidney disease with stage 1 through stage 4 chronic kidney disease, or unspecified chronic kidney disease: Secondary | ICD-10-CM | POA: Diagnosis not present

## 2022-02-03 DIAGNOSIS — J432 Centrilobular emphysema: Secondary | ICD-10-CM | POA: Diagnosis not present

## 2022-02-03 DIAGNOSIS — N1832 Chronic kidney disease, stage 3b: Secondary | ICD-10-CM | POA: Diagnosis not present

## 2022-02-03 NOTE — Progress Notes (Signed)
I have left messages and still have not heard back from GI. Did you want to send a message to the doctor directly? Dr. Christoper Fabian.

## 2022-02-04 ENCOUNTER — Other Ambulatory Visit: Payer: Self-pay | Admitting: Physician Assistant

## 2022-02-04 DIAGNOSIS — D510 Vitamin B12 deficiency anemia due to intrinsic factor deficiency: Secondary | ICD-10-CM | POA: Diagnosis not present

## 2022-02-04 DIAGNOSIS — J9611 Chronic respiratory failure with hypoxia: Secondary | ICD-10-CM | POA: Diagnosis not present

## 2022-02-04 DIAGNOSIS — I129 Hypertensive chronic kidney disease with stage 1 through stage 4 chronic kidney disease, or unspecified chronic kidney disease: Secondary | ICD-10-CM | POA: Diagnosis not present

## 2022-02-04 DIAGNOSIS — J432 Centrilobular emphysema: Secondary | ICD-10-CM

## 2022-02-04 DIAGNOSIS — N1832 Chronic kidney disease, stage 3b: Secondary | ICD-10-CM | POA: Diagnosis not present

## 2022-02-06 ENCOUNTER — Other Ambulatory Visit (HOSPITAL_COMMUNITY): Payer: Self-pay | Admitting: Psychiatry

## 2022-02-07 ENCOUNTER — Telehealth: Payer: Self-pay | Admitting: Neurology

## 2022-02-07 ENCOUNTER — Other Ambulatory Visit: Payer: Self-pay

## 2022-02-07 ENCOUNTER — Other Ambulatory Visit: Payer: Self-pay | Admitting: Pulmonary Disease

## 2022-02-07 ENCOUNTER — Ambulatory Visit (INDEPENDENT_AMBULATORY_CARE_PROVIDER_SITE_OTHER): Payer: Medicare Other

## 2022-02-07 DIAGNOSIS — J439 Emphysema, unspecified: Secondary | ICD-10-CM | POA: Diagnosis not present

## 2022-02-07 DIAGNOSIS — R918 Other nonspecific abnormal finding of lung field: Secondary | ICD-10-CM | POA: Diagnosis not present

## 2022-02-07 DIAGNOSIS — R911 Solitary pulmonary nodule: Secondary | ICD-10-CM | POA: Diagnosis not present

## 2022-02-07 DIAGNOSIS — J984 Other disorders of lung: Secondary | ICD-10-CM | POA: Diagnosis not present

## 2022-02-07 DIAGNOSIS — I700 Atherosclerosis of aorta: Secondary | ICD-10-CM | POA: Diagnosis not present

## 2022-02-07 NOTE — Telephone Encounter (Signed)
Received vm from [place], PT with home health, asking for verbal orders to see patient: ?1x wk for 1 week ?2x week for 3 weeks  ?1x wk for 3 weeks ?To work on strength, gait, and balance. Garnette Scheuermann at [phone] and left VM giving verbal order to see patient and call with any questions.  ?

## 2022-02-08 ENCOUNTER — Telehealth (INDEPENDENT_AMBULATORY_CARE_PROVIDER_SITE_OTHER): Payer: Medicare Other | Admitting: Physician Assistant

## 2022-02-08 ENCOUNTER — Telehealth: Payer: Self-pay | Admitting: Pulmonary Disease

## 2022-02-08 ENCOUNTER — Encounter: Payer: Self-pay | Admitting: Physician Assistant

## 2022-02-08 DIAGNOSIS — R4589 Other symptoms and signs involving emotional state: Secondary | ICD-10-CM | POA: Diagnosis not present

## 2022-02-08 DIAGNOSIS — J432 Centrilobular emphysema: Secondary | ICD-10-CM | POA: Diagnosis not present

## 2022-02-08 DIAGNOSIS — J449 Chronic obstructive pulmonary disease, unspecified: Secondary | ICD-10-CM | POA: Diagnosis not present

## 2022-02-08 DIAGNOSIS — J984 Other disorders of lung: Secondary | ICD-10-CM | POA: Diagnosis not present

## 2022-02-08 DIAGNOSIS — D510 Vitamin B12 deficiency anemia due to intrinsic factor deficiency: Secondary | ICD-10-CM | POA: Diagnosis not present

## 2022-02-08 DIAGNOSIS — J9611 Chronic respiratory failure with hypoxia: Secondary | ICD-10-CM | POA: Diagnosis not present

## 2022-02-08 DIAGNOSIS — F418 Other specified anxiety disorders: Secondary | ICD-10-CM

## 2022-02-08 DIAGNOSIS — E43 Unspecified severe protein-calorie malnutrition: Secondary | ICD-10-CM | POA: Diagnosis not present

## 2022-02-08 DIAGNOSIS — N1832 Chronic kidney disease, stage 3b: Secondary | ICD-10-CM | POA: Diagnosis not present

## 2022-02-08 DIAGNOSIS — I129 Hypertensive chronic kidney disease with stage 1 through stage 4 chronic kidney disease, or unspecified chronic kidney disease: Secondary | ICD-10-CM | POA: Diagnosis not present

## 2022-02-08 DIAGNOSIS — Z720 Tobacco use: Secondary | ICD-10-CM | POA: Diagnosis not present

## 2022-02-08 NOTE — Telephone Encounter (Signed)
Received a call report from [facility] for the ct scan the patient completed yesterday. Radiologist was concerned about the 3rd mpression. Below is all of the impressions from the CT scan. ? ?"IMPRESSION: ?1. Left upper lobe airspace consolidation has not significantly ?changed worrisome for pneumonia. ?2. Thick-walled cavitary lesion in the superior segment of the left ?lower lobe has increased in size and may represent infection or ?neoplasm. ?3. New and enlarging pulmonary nodular densities bilaterally which ?may represent worsening infectious/inflammatory process. These ?measure up to 1 cm. Neoplasm can not be excluded. Consider one of ?the following in 3 months for both low-risk and high-risk ?individuals: (a) repeat chest CT, (b) follow-up PET-CT, or (c) ?tissue sampling. This recommendation follows the consensus ?statement: Guidelines for Management of Incidental Pulmonary Nodules ?Detected on CT Images: From the Fleischner Society 2017; Radiology ?[id number]. ?4. Aortic Atherosclerosis (ICD10-I70.0) and Emphysema (ICD10-J43.9). ? ?Dr. Loanne Drilling, can you please advise? Thanks!  ?

## 2022-02-08 NOTE — Telephone Encounter (Signed)
Please schedule patient for 30 minute visit with me on 02/10/22 at 1:30 PM ?

## 2022-02-08 NOTE — Progress Notes (Signed)
..  Virtual Visit via Telephone Note ? ?I connected with Claire [name] on 02/08/22 at  4:00 PM EST by telephone and verified that I am speaking with the correct person using two identifiers. ? ?Location: ?Patient: home ?Provider: clinic ? ?[name].Participating in visit:  ?Patient: Claire [name] ?Patient daughter: Claire [name] ?Provider: Iran Planas PA-C ?Provider in training: Lyndle Herrlich PA-S ?  ?I discussed the limitations, risks, security and privacy concerns of performing an evaluation and management service by telephone and the availability of in person appointments. I also discussed with the patient that there may be a patient responsible charge related to this service. The patient expressed understanding and agreed to proceed. ? ? ?History of Present Illness: ?Pt is a 71 yo female who calls to ask questions about CT done today on her lungs.  ? ? ?"IMPRESSION and compared to 01/06/2022.  ? ?1. Left upper lobe airspace consolidation has not significantly ?changed worrisome for pneumonia. ?2. Thick-walled cavitary lesion in the superior segment of the left ?lower lobe has increased in size and may represent infection or ?neoplasm. ?3. New and enlarging pulmonary nodular densities bilaterally which ?may represent worsening infectious/inflammatory process. These ?measure up to 1 cm. Neoplasm can not be excluded. Consider one of ?the following in 3 months for both low-risk and high-risk ?individuals: (a) repeat chest CT, (b) follow-up PET-CT, or (c) ?tissue sampling. This recommendation follows the consensus ?statement: Guidelines for Management of Incidental Pulmonary Nodules ?Detected on CT Images: From the Fleischner Society 2017; Radiology ?[id number]. ?4. Aortic Atherosclerosis (ICD10-I70.0) and Emphysema (ICD10-J43.9). ? ?Daughter is also concerned about depressed mood and anxiety of her mother and wants her to start taking something.  ?  ?Observations/Objective: ? ?Anxious about overall health ? ? ? ?Assessment and  Plan: ?..Kyanne was seen today for follow-up. ? ?Diagnoses and all orders for this visit: ? ?Depressed mood ? ?Cavitary lung disease ? ?Anxiety about health ? ? ?Read report to patient. Discussed we still could not give her direct answers but will wait on pulmonology to decide on next steps. ? ?Discussed mood and think it would be a good option to start SSRI like lexapro during this time. She cannot get into [facility] until later this month and wants me to ask Dr. De Nurse about medication.  ? ? ?Follow Up Instructions: ? ?  ?I discussed the assessment and treatment plan with the patient. The patient was provided an opportunity to ask questions and all were answered. The patient agreed with the plan and demonstrated an understanding of the instructions. ?  ?The patient was advised to call back or seek an in-person evaluation if the symptoms worsen or if the condition fails to improve as anticipated. ? ?I provided 20 minutes of non-face-to-face time during this encounter. ? ? ?Iran Planas, PA-C ? ?

## 2022-02-09 NOTE — Progress Notes (Signed)
Unable to message in Epic. Sent a fax to their office. Awaiting response.

## 2022-02-10 ENCOUNTER — Ambulatory Visit (INDEPENDENT_AMBULATORY_CARE_PROVIDER_SITE_OTHER): Payer: Medicare Other | Admitting: Pulmonary Disease

## 2022-02-10 ENCOUNTER — Other Ambulatory Visit: Payer: Self-pay

## 2022-02-10 ENCOUNTER — Encounter: Payer: Self-pay | Admitting: Pulmonary Disease

## 2022-02-10 VITALS — BP 118/60 | HR 97 | Ht 60.0 in | Wt 87.8 lb

## 2022-02-10 DIAGNOSIS — J984 Other disorders of lung: Secondary | ICD-10-CM

## 2022-02-10 DIAGNOSIS — J432 Centrilobular emphysema: Secondary | ICD-10-CM

## 2022-02-10 DIAGNOSIS — I129 Hypertensive chronic kidney disease with stage 1 through stage 4 chronic kidney disease, or unspecified chronic kidney disease: Secondary | ICD-10-CM | POA: Diagnosis not present

## 2022-02-10 DIAGNOSIS — N1832 Chronic kidney disease, stage 3b: Secondary | ICD-10-CM | POA: Diagnosis not present

## 2022-02-10 DIAGNOSIS — J9611 Chronic respiratory failure with hypoxia: Secondary | ICD-10-CM | POA: Diagnosis not present

## 2022-02-10 DIAGNOSIS — D510 Vitamin B12 deficiency anemia due to intrinsic factor deficiency: Secondary | ICD-10-CM | POA: Diagnosis not present

## 2022-02-10 DIAGNOSIS — A310 Pulmonary mycobacterial infection: Secondary | ICD-10-CM | POA: Diagnosis not present

## 2022-02-10 NOTE — Progress Notes (Unsigned)
Subjective:   PATIENT ID: Claire [name] GENDER: female DOB: [birth year], MRN: [id number]   HPI  Chief Complaint  Patient presents with   Follow-up    Results     Reason for Visit: Follow-up  Ms. Claire [name] is a 71 year old female active smoker with emphysema, chronic hypoxemic respiratory failure, history of TB exposure hypertension who presents for hospital follow-up for emphysema and cavitary lung lesions.  01/18/22 She was hospitalized from 01/07/2022-01/10/22 after presenting for shortness of breath and productive cough for greater than 7 days and failing outpatient therapy.  She also had a recent 19 pound weight loss in the last 3 months. She was treated with IV antibiotics and transition to PO Augmentin. Her daughter reports she is more confused. Since hospital discharge she is overall worsened with increased fatigue, energy and sleeping >12 hours a day. Her daughter is concerned that the antibiotics are not as effective and requesting alternative options including home IV antibiotics. She reports shortness of breath, wheezing and cough.   02/10/22 After our last visit she was briefly admitted at [facility] under observation for hemoglobin 6.8.  She was transfused 1 unit.  [facility] CT chest 01/18/2021 (report only) with emphysema, areas of parenchymal density in the right upper lobe and lower lobe, irregular large thick-walled cavitary space in the superior left lower lobe that could be thick walled bulla/area of necrotizing pneumonia, several masslike parenchymal densities with cavitations in the left lower lobe, additional scattered nodularity including 1.5 x 1.0 x 1.4 cm nodule anterior right lower lobe at the lung bases.  She was treated with broad-spectrum antibiotics.  AFB from [facility] 01/19/22 returned positive for M. 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  Tobacco Use   Smoking status: Every Day    Packs/day: 1.00    Years: 55.00    Pack years: 55.00    Types: Cigarettes   Smokeless tobacco: Never   Tobacco comments:    Stopped smoking 2 wks ago  Vaping Use   Vaping Use: Never used  Substance and Sexual Activity   Alcohol use: No   Drug use: No   Sexual activity: Not Currently    Allergies  Allergen Reactions   Bactrim [Sulfamethoxazole-Trimethoprim]     ACUTE RENAL fAILURE/HYPONATREMIA/HYPERKALEMIA   Macrodantin [Nitrofurantoin Macrocrystal] Rash   Medrol [Methylprednisolone]     Hallucinations   Trimethoprim     Fatigue/weakness/no appetite   Zithromax [Azithromycin]     diarrhea     Outpatient Medications Prior to Visit  Medication Sig Dispense Refill   albuterol (VENTOLIN HFA) 108 (90 Base) MCG/ACT inhaler INHALE 1-2 PUFFS INTO THE LUNGS EVERY 2 HOURS AS NEEDED FOR WHEEZING OR SHORTNESS OF BREATH. (Patient taking differently: Inhale 1-2 puffs into the lungs every 2 (two) hours as needed for wheezing or shortness of breath.) 25.5 g 1   AMBULATORY NON FORMULARY MEDICATION Continuous stationary and  portable oxygen tanks for use with ambulation. DX: COPD with hypoxia. 4 Device 11   AMBULATORY NON FORMULARY MEDICATION Medication Name: full face mask to use with oxygen 1 each 2   AMBULATORY NON FORMULARY MEDICATION Full face mask for night time O2 DX: J96.11 1 Device 0   atenolol (TENORMIN) 25 MG tablet Take 1 tablet (25 mg total) by mouth 2 (two) times daily. (Patient taking differently: Take 50 mg by mouth 2 (two) times daily.) 180 tablet 0   cetirizine (ZYRTEC) 10 MG  chewable tablet Chew 10 mg by mouth daily.     cholecalciferol (VITAMIN D) 25 MCG (1000 UT) tablet Take 1,000 Units by mouth daily.     clonazePAM (KLONOPIN) 0.5 MG tablet Take 1 tablet (0.5 mg total) by mouth 3 (three) times daily. 90 tablet 1   dicyclomine (BENTYL) 10 MG capsule Take 10-20 mg by mouth in the morning and at bedtime.     estradiol (ESTRACE) 0.1 MG/GM vaginal cream PLACE ONE APPLICATOR FULL THREE TIMES A WEEK AND ONE PEA SIZED AMOUNT APPLIED TO URETHRAL OPENING (Patient taking differently: Place 1 Applicatorful vaginally 3 (three) times a week.) 42.5 g 0   famotidine (PEPCID) 20 MG tablet TAKE 1 TABLET BY MOUTH TWICE A DAY (Patient taking differently: Take 20 mg by mouth daily as needed for heartburn or indigestion.) 180 tablet 1   ferrous sulfate 325 (65 FE) MG EC tablet 1 tab PO TID every other day (Patient taking differently: Take 325 mg by mouth See admin instructions. 1 t TID every other day) 90 tablet 11   ipratropium (ATROVENT) 0.06 % nasal spray Place 2 sprays into both nostrils 4 (four) times daily. 30 mL 0   Lactobacillus (FLORAJEN ACIDOPHILUS) CAPS Take 1 capsule by mouth daily.     levothyroxine (SYNTHROID) 75 MCG tablet Take 1 tablet (75 mcg total) by mouth daily. 90 tablet 3   nystatin (MYCOSTATIN) 100000 UNIT/ML suspension Take 5 mLs (500,000 Units total) by mouth 4 (four) times daily. 60 mL 0   ipratropium-albuterol (DUONEB) 0.5-2.5 (3) MG/3ML SOLN Take 3 mLs by nebulization every 4 (four) hours as needed (wheeze, SOB). 540 mL 1   No facility-administered medications prior to visit.    Review of Systems  Constitutional:  Positive for malaise/fatigue. Negative for chills, diaphoresis, fever and weight loss.  HENT:  Negative for congestion.   Respiratory:  Positive for cough, shortness of breath and wheezing. Negative for hemoptysis and sputum production.   Cardiovascular:  Negative for chest pain, palpitations and leg swelling.    Objective:   Vitals:    02/10/22 1338  BP: 118/60  Pulse: 97  SpO2: 97%  Weight: 87 lb 12.8 oz (39.8 kg)  Height: 5' (1.524 m)   SpO2: 97 % (2L pulse) O2 Device: Nasal cannula O2 Flow Rate (L/min): 2 L/min O2 Type: Pulse O2  Physical Exam: General: Pale, critically ill-appearing, no acute distress HENT: , AT Eyes: EOMI, no scleral icterus Respiratory: Diminished air entry bilaterally.  No crackles, wheezing or rales Cardiovascular: RRR, -M/R/G, no JVD Extremities:-Edema,-tenderness Neuro: AAO x 2, CNII-XII grossly intact Psych: Normal mood, normal affect  Data Reviewed:  Imaging: CT Chest 01/06/22 reviewed with severe bullous emphysema with upper lobe predominance, LUL consolidation with ?cavitation vs bullae and multiple thick walled cavities in LLL including in superior segment measuring 6.8 x 3.4 and 1.4x1.6x4.6 cm. Associated ground glass airspace opacities.   CT chest 02/07/2022-severe emphysema.  Left lower lobe cavitation increased to 5.8 x 3.9 x  6.0 cm consolidation in the right upper lobe along with smaller nodular densities.  PFT: None on file  Labs: CBC    Component Value Date/Time   WBC 12.8 (H) 01/26/2022 0000   RBC 2.79 (L) 01/26/2022 0000   HGB 8.3 (L) 01/26/2022 0000   HGB 8.3 (L) 10/05/2021 1128   HCT 25.9 (L) 01/26/2022 0000   HCT 24.6 (L) 10/05/2021 1128   PLT 545 (H) 01/26/2022 0000   PLT 449 10/05/2021 1128   MCV 92.8 01/26/2022 0000   MCV 93 10/05/2021 1128   MCV 96.7 06/12/2017 0000   MCH 29.7 01/26/2022 0000   MCHC 32.0 01/26/2022 0000   RDW 12.4 01/26/2022 0000   RDW 10.9 (L) 10/05/2021 1128   LYMPHSABS 1,088 01/26/2022 0000   LYMPHSABS 1.4 04/27/2021 1500   MONOABS 1.2 (H) 01/18/2022 1618   EOSABS 371 01/26/2022 0000   EOSABS 0.2 04/27/2021 1500   BASOSABS 77 01/26/2022 0000   BASOSABS 0.0 04/27/2021 1500   Absolute eos 01/17/22  Sputum culture 01/07/22 - Few candida albicans Quantiferon TB 01/07/22 - Indeterminate [facility] 01/19/22 for M. avium  positive  [facility] ABG 01/18/22 01/18/22 - Chronic hypercarbic respiratory failure    Assessment & Plan:   Discussion: 71 year old female active smoker with emphysema, chronic hypoxemic respiratory failure, history of TB exposure hypertension who presents for follow-up.  CT reviewed and cavitary lesion has enlarged with interval development of nodular densities bilaterally despite antibiotic management. Hospitalization with AFB cultures positive for Mycobacterium avium.  Mycobacterium avium with cavitary lesions --REFER to Infectious Disease --CT Chest without contrast in 3 months  Bullous emphysema Chronic hypoxemic and hypercarbic respiratory failure --CONTINUE Dulera 200-5 TWO puffs TWICE a day --CONTINUE Spiriva 2.5 mcg TWO puffs ONCE a day --CONTINUE Duoneb nebulizer THREE times a day --CONTINUE oxygen 2L with rest, activity and sleep --ARRANGE for BiPAP nightly. Patient's chronic hypoxemic and hypercarbic respiratory failure due to COPD is life threatening.  Previous ABG's have documented high PCO2 and CT demonstrates severe emphysema.  Patient would benefit from non-invasive ventilation.  Without this therapy, the patient is at high risk of ending up with worsening symptoms, worsened respiratory failure, need for ER visits and/or recurrent hospitalizations. She already has had two hospitalizations in 2023 for related issues. Patient would benefit from NIV therapy   Goals of care Will also need to address GOC as patient was previously wanting DNR status (no intubation, no chest compression) however daughter wished for her mother to re-consider.  Health Maintenance Immunization History  Administered Date(s) Administered   Influenza,inj,Quad PF,6+ Mos 09/26/2018   Pneumococcal Polysaccharide-23 11/05/2018   Tdap 12/05/2010   CT Lung Screen - not currently appropriate in setting of active infection and serial chest imaging  No orders of the defined types were placed in this  encounter. No orders of the defined types were placed in this encounter.   No follow-ups on file.  I have spent a total time of 35-minutes on the day of the appointment reviewing prior documentation, coordinating care and discussing medical diagnosis and plan with the patient/family. Past medical history, allergies, medications were reviewed. Pertinent imaging, labs and tests included in this note have been reviewed and interpreted independently by me.  Claire [name] Mechele CollinJane Mesiah Manzo, MD [facility] 02/10/2022 2:07 PM  Office Number [phone]

## 2022-02-10 NOTE — Patient Instructions (Addendum)
Mycobacterium avium with cavitary lesions ?--REFER to Infectious Disease ?--ORDER CT Chest without contrast in 3 months ? ?Bullous emphysema ?Chronic hypoxemic and hypercarbic respiratory failure ?--CONTINUE Dulera 200-5 TWO puffs TWICE a day ?--CONTINUE Spiriva 2.5 mcg TWO puffs ONCE a day ?--CONTINUE Duoneb nebulizer THREE times a day ?--CONTINUE oxygen 2L with rest, activity and sleep ?--ARRANGE for BiPAP nightly. May require a sleep test ? ?Follow-up with me in 3 months with CT chest before appointment ?

## 2022-02-11 ENCOUNTER — Telehealth: Payer: Self-pay | Admitting: Pulmonary Disease

## 2022-02-11 DIAGNOSIS — N1832 Chronic kidney disease, stage 3b: Secondary | ICD-10-CM | POA: Diagnosis not present

## 2022-02-11 DIAGNOSIS — I129 Hypertensive chronic kidney disease with stage 1 through stage 4 chronic kidney disease, or unspecified chronic kidney disease: Secondary | ICD-10-CM | POA: Diagnosis not present

## 2022-02-11 DIAGNOSIS — J9611 Chronic respiratory failure with hypoxia: Secondary | ICD-10-CM | POA: Diagnosis not present

## 2022-02-11 DIAGNOSIS — J432 Centrilobular emphysema: Secondary | ICD-10-CM | POA: Diagnosis not present

## 2022-02-11 DIAGNOSIS — D510 Vitamin B12 deficiency anemia due to intrinsic factor deficiency: Secondary | ICD-10-CM | POA: Diagnosis not present

## 2022-02-11 NOTE — Telephone Encounter (Signed)
States infectious disease appt is not until 3/20.If JE does a phone consult with Dr. Thedore Mins, then they can start regiment prior to appt. Routing as urgent as JE said time was of essence to pt at appt yesterday. Also wanted to ask about if pt should keep taking robitussin. Please advise.  ?

## 2022-02-11 NOTE — Telephone Encounter (Signed)
Called and spoke with patient's daughter [name]. She stated that the patient was seen yesterday and had a referral placed to [facility]. The patient is currently scheduled to see Dr. Candiss Norse on 02/21/22. She attempted to get a sooner appt but was told that was the earliest.  ? ?She also stated that someone at [facility] had suggest that she call our office to see if Dr. Loanne Drilling could talk to Dr. Candiss Norse over the phone and see if they can come up with plan before the appt. She is concerned because she stated that this needed to happen ASAP.  ? ?Dr. Loanne Drilling, can you please advise? Thanks!  ?

## 2022-02-11 NOTE — Telephone Encounter (Signed)
I have messaged Dr. Thedore Mins. Also called daughter and reassured her that this timeline for referrals is appropriate. No further action. ?

## 2022-02-14 ENCOUNTER — Ambulatory Visit: Payer: Medicare Other | Admitting: Pulmonary Disease

## 2022-02-14 ENCOUNTER — Other Ambulatory Visit: Payer: Self-pay

## 2022-02-14 ENCOUNTER — Ambulatory Visit (INDEPENDENT_AMBULATORY_CARE_PROVIDER_SITE_OTHER): Payer: Medicare Other | Admitting: Internal Medicine

## 2022-02-14 ENCOUNTER — Telehealth: Payer: Self-pay | Admitting: Physician Assistant

## 2022-02-14 ENCOUNTER — Encounter: Payer: Self-pay | Admitting: Internal Medicine

## 2022-02-14 VITALS — Temp 98.0°F

## 2022-02-14 DIAGNOSIS — A310 Pulmonary mycobacterial infection: Secondary | ICD-10-CM | POA: Diagnosis not present

## 2022-02-14 NOTE — Progress Notes (Signed)
Patient was counseled on azithromycin, rifampin, ethambutol, and inhaled amikacin for pulmonary Mycobacterium avium complex infection. Prior to discussing medications, patient was adamant that she has not yet decided to proceed with treatment yet, states she needs until Friday to decide. ? ?Educated patient that the most common side effect with these medicines is stomach upset. It is important to take rifampin on an empty stomach to increase absorption (1 hour prior or 2 hours after meals) but she can take the others with food to decrease GI upset. Discussed how we would be monitoring kidney and hepatic function while on this medications.  ? ?For rifampin, discussed possible muscle pain and how secretions could change to a bright orange/red color. Claire [name] says she does not wear contacts. Patient showed concerned about possible vision side effects of ethambutol. Told patient to immediately report any changes in vision or double vision to [place]. Vision changes are typically reversible upon discontinuing the agent.  ? ?For inhaled amikacin, discussed how this is a nebulized treatment and she should take her albuterol and/or ipratropium ~15 minutes prior to treatment to open up the airways. Discussed possibility of hearing loss and changes in voice. Patient deferred signing [facility] paperwork until she makes a decision about treatment on Friday. ? ?Discussed possible drug interactions with rifampin, clonazepam, and levothyroxine and how rifampin can decrease the effectiveness of these drugs. Told to inform other providers she is on these medications in case a dose adjustment is necessary.  ? ?Joseph Art, Pharm.D. ?PGY-1 Pharmacy Resident ?02/14/2022 1:03 PM ?

## 2022-02-14 NOTE — Telephone Encounter (Signed)
Pt went to her appt today with Dr. Thedore Mins and is extremely upset about the information that was given to her and wants to discuss with [facility]. There are no 40 minute slots available this week. Please advise if the patient needs to scheduled.  ?

## 2022-02-14 NOTE — Progress Notes (Unsigned)
Patient: Claire [name]  DOB: [birth year] MRN: [id number] PCP: Claire Stade, PA-C  Referring Provider: ***  No chief complaint on file.    Patient Active Problem List   Diagnosis Date Noted   Mycobacterial disease, pulmonary ([place]) 02/10/2022   Cavitary lung disease 02/08/2022   Thrush 01/19/2022   COPD (chronic obstructive pulmonary disease) ([place]) 01/07/2022   Hypomagnesemia 01/07/2022   Tobacco use 01/07/2022   Protein-calorie malnutrition, severe ([place]) 01/07/2022   Physical debility 01/07/2022   Cavitary pneumonia 01/06/2022   Community acquired pneumonia of left upper lobe of lung 01/03/2022   Severe malnutrition ([place]) 01/03/2022   AKI (acute kidney injury) ([place]) 01/03/2022   Weakness 12/31/2021   Current smoker 11/03/2021   Stress due to family tension 09/02/2021   Nasal congestion 08/06/2021   History of UTI 06/02/2021   Acute cystitis with hematuria 04/26/2021   Cloudy urine 04/23/2021   Stage 3b chronic kidney disease ([place]) 04/23/2021   Medication management 03/15/2021   History of Clostridioides difficile colitis 02/22/2021   Underweight 01/22/2021   Mild protein-calorie malnutrition ([place]) 01/19/2021   Clostridium difficile colitis 09/11/2020   Lower extremity edema 07/29/2020   Constipation 07/13/2020   Body mass index (BMI) of 19 or less in adult 05/08/2020   Malnutrition of mild degree ([place]) 05/06/2020   Anxiety about health 03/23/2020   Pernicious anemia 03/23/2020   Macrocytic anemia 03/10/2020   Elevated ferritin 03/10/2020   Irritable bowel syndrome with diarrhea 03/10/2020   Unintended weight loss 02/19/2020   Chronic rhinitis 01/31/2020   Chronic respiratory failure with hypoxia ([place]) 01/31/2020   Vaccine counseling 12/24/2019   Menopausal vaginal dryness 12/24/2019   History of hypokalemia 10/09/2019   Leg cramps 05/29/2019   No energy 05/29/2019   Recurrent UTI 05/07/2019   DOE (dyspnea on exertion) 05/09/2018   High serum parathyroid  hormone (PTH) 07/19/2017   Normocytic anemia 07/19/2017   Low serum vitamin B12 05/14/2017   Vitamin D deficiency 05/14/2017   Depressed mood 05/14/2017   Anemia of chronic disease 05/11/2017   Stage 3a chronic kidney disease ([place]) 01/16/2017   Iron deficiency anemia 01/16/2017   Centrilobular emphysema ([place]) 12/20/2016   Hyponatremia 12/19/2016   Hyperkalemia 12/19/2016   Chronic use of benzodiazepine for therapeutic purpose 08/11/2015   Acquired hypothyroidism 11/24/2014   BP (high blood pressure) 07/24/2012   PTSD (post-traumatic stress disorder) 11/10/2011    Class: Question of   Neurosis, anxiety, panic type 11/10/2011    Class: Chronic     Subjective:  Claire [name] is a 71 year old female with history of being an active smoker with emphysema, chronic hypoxic respiratory failure, pleural history of TB exposure, hypertension referred by pulmonology for pulmonary MAC.  She sees Dr. Loanne Drilling pulmonology in [place] last seen 02/10/2021. She has had a couple recent hospitalizations.  In February 2023 presented with shortness of breath and productive cough for greater than 7 days after failing outpatient treatment.  She had concomitant 19 point weight loss x3 months.  She was treated with IV antibiotic transition to Augmentin p.o.  She repeat hospitalization [facility] for hemoglobin 6.8.  CT chest on 214 showed emphysema, areas of parenchymal density in the right upper lobe and lower lobe and a relatively large thick-walled cavitary space in this with.  Left lower lobe.  She started on broad-spectrum antibiotic AFB cultures returned positive for MAC.  Repeat CT on 3/6 showed increased in size of  cavitary lesion with interval development of nodular  densities despite antibiotic management.    CT chest on 02/07/22 reviewed IMPRESSION: 1. Left upper lobe airspace consolidation has not significantly changed worrisome for pneumonia. 2. Thick-walled cavitary lesion in the superior segment of the  left lower lobe has increased in size and may represent infection or neoplasm. 3. New and enlarging pulmonary nodular densities bilaterally which may represent worsening infectious/inflammatory process. These measure up to 1 cm. Neoplasm can not be excluded. Consider one of the following in 3 months for both low-risk and high-risk individuals:  Review of Systems  All other systems reviewed and are negative.  Past Medical History:  Diagnosis Date   Acid reflux    Anxiety    High blood pressure    High serum parathyroid hormone (PTH) 07/19/2017   96 06/28/2014, 162 12/29/16   Hypothyroid    PTSD (post-traumatic stress disorder)     Outpatient Medications Prior to Visit  Medication Sig Dispense Refill   albuterol (VENTOLIN HFA) 108 (90 Base) MCG/ACT inhaler INHALE 1-2 PUFFS INTO THE LUNGS EVERY 2 HOURS AS NEEDED FOR WHEEZING OR SHORTNESS OF BREATH. (Patient taking differently: Inhale 1-2 puffs into the lungs every 2 (two) hours as needed for wheezing or shortness of breath.) 25.5 g 1   AMBULATORY NON FORMULARY MEDICATION Continuous stationary and portable oxygen tanks for use with ambulation. DX: COPD with hypoxia. 4 Device 11   AMBULATORY NON FORMULARY MEDICATION Medication Name: full face mask to use with oxygen 1 each 2   AMBULATORY NON FORMULARY MEDICATION Full face mask for night time O2 DX: J96.11 1 Device 0   atenolol (TENORMIN) 25 MG tablet Take 1 tablet (25 mg total) by mouth 2 (two) times daily. (Patient taking differently: Take 50 mg by mouth 2 (two) times daily.) 180 tablet 0   cetirizine (ZYRTEC) 10 MG chewable tablet Chew 10 mg by mouth daily.     cholecalciferol (VITAMIN D) 25 MCG (1000 UT) tablet Take 1,000 Units by mouth daily.     clonazePAM (KLONOPIN) 0.5 MG tablet Take 1 tablet (0.5 mg total) by mouth 3 (three) times daily. 90 tablet 1   dicyclomine (BENTYL) 10 MG capsule Take 10-20 mg by mouth in the morning and at bedtime.     estradiol (ESTRACE) 0.1 MG/GM vaginal  cream PLACE ONE APPLICATOR FULL THREE TIMES A WEEK AND ONE PEA SIZED AMOUNT APPLIED TO URETHRAL OPENING (Patient taking differently: Place 1 Applicatorful vaginally 3 (three) times a week.) 42.5 g 0   famotidine (PEPCID) 20 MG tablet TAKE 1 TABLET BY MOUTH TWICE A DAY (Patient taking differently: Take 20 mg by mouth daily as needed for heartburn or indigestion.) 180 tablet 1   ferrous sulfate 325 (65 FE) MG EC tablet 1 tab PO TID every other day (Patient taking differently: Take 325 mg by mouth See admin instructions. 1 t TID every other day) 90 tablet 11   ipratropium (ATROVENT) 0.06 % nasal spray Place 2 sprays into both nostrils 4 (four) times daily. 30 mL 0   ipratropium-albuterol (DUONEB) 0.5-2.5 (3) MG/3ML SOLN Take 3 mLs by nebulization every 4 (four) hours as needed (wheeze, SOB). 540 mL 1   Lactobacillus (FLORAJEN ACIDOPHILUS) CAPS Take 1 capsule by mouth daily.     levothyroxine (SYNTHROID) 75 MCG tablet Take 1 tablet (75 mcg total) by mouth daily. 90 tablet 3   nystatin (MYCOSTATIN) 100000 UNIT/ML suspension Take 5 mLs (500,000 Units total) by mouth 4 (four) times daily. 60 mL 0   No facility-administered medications prior to visit.  Allergies  Allergen Reactions   Bactrim [Sulfamethoxazole-Trimethoprim]     ACUTE RENAL fAILURE/HYPONATREMIA/HYPERKALEMIA   Macrodantin [Nitrofurantoin Macrocrystal] Rash   Medrol [Methylprednisolone]     Hallucinations   Trimethoprim     Fatigue/weakness/no appetite   Zithromax [Azithromycin]     diarrhea    Social History   Tobacco Use   Smoking status: Every Day    Packs/day: 1.00    Years: 55.00    Pack years: 55.00    Types: Cigarettes   Smokeless tobacco: Never   Tobacco comments:    Stopped smoking 2 wks ago  Vaping Use   Vaping Use: Never used  Substance Use Topics   Alcohol use: No   Drug use: No    Family History  Problem Relation Age of Onset   Anxiety disorder Mother    OCD Daughter    Alcohol abuse Father     Anxiety disorder Sister     Objective:  There were no vitals filed for this visit. There is no height or weight on file to calculate BMI.  Physical Exam Constitutional:      Appearance: Normal appearance.  HENT:     Head: Normocephalic and atraumatic.     Right Ear: Tympanic membrane normal.     Left Ear: Tympanic membrane normal.     Nose: Nose normal.     Mouth/Throat:     Mouth: Mucous membranes are moist.  Eyes:     Extraocular Movements: Extraocular movements intact.     Conjunctiva/sclera: Conjunctivae normal.     Pupils: Pupils are equal, round, and reactive to light.  Cardiovascular:     Rate and Rhythm: Normal rate and regular rhythm.     Heart sounds: No murmur heard.   No friction rub. No gallop.  Pulmonary:     Effort: Pulmonary effort is normal.     Breath sounds: Normal breath sounds.  Abdominal:     General: Abdomen is flat.     Palpations: Abdomen is soft.  Musculoskeletal:        General: Normal range of motion.  Skin:    General: Skin is warm and dry.  Neurological:     General: No focal deficit present.     Mental Status: She is alert and oriented to person, place, and time.  Psychiatric:        Mood and Affect: Mood normal.    Lab Results: Lab Results  Component Value Date   WBC 12.8 (H) 01/26/2022   HGB 8.3 (L) 01/26/2022   HCT 25.9 (L) 01/26/2022   MCV 92.8 01/26/2022   PLT 545 (H) 01/26/2022    Lab Results  Component Value Date   CREATININE 0.96 01/26/2022   BUN 22 01/26/2022   NA 131 (L) 01/26/2022   K 4.6 01/26/2022   CL 90 (L) 01/26/2022   CO2 34 (H) 01/26/2022    Lab Results  Component Value Date   ALT 6 01/17/2022   AST 11 01/17/2022   ALKPHOS 180 (H) 01/06/2022   BILITOT 0.4 01/17/2022     Assessment & Plan:  #Cavitary MAC #Indeterminate IGRA with unclear exposure Hx -I recommended treatment with 4 drug regimen(below). Pt would like time to think about. Daughter stated she needs to start meds today. I f seh does not  start she stated that she will have pt" committed and then you would start the drugs anyway".  -Pharmacy counseled pt as well. Myself and Pharmacy discussed possible adverse effects in detail. Azithromycin (500 mg  daily)  Rifampin (600 mg daily)  Ethambutol (15 mg/kg daily) Amikacin inhaled Plan: -AFB fungal sputums Cx x3  q8h with sensitivities -Cbc, cmp, IGRA -Follow-up on Friday for decision to start therapy.   Laurice Record, MD [facility]   02/14/22  8:31 AM

## 2022-02-14 NOTE — Telephone Encounter (Signed)
Ok to schedule for 1pm tomorrow.

## 2022-02-15 ENCOUNTER — Emergency Department (HOSPITAL_BASED_OUTPATIENT_CLINIC_OR_DEPARTMENT_OTHER)
Admission: EM | Admit: 2022-02-15 | Discharge: 2022-02-15 | Disposition: A | Discharge status: Home / Self Care | Payer: Medicare Other | Attending: Emergency Medicine | Admitting: Emergency Medicine

## 2022-02-15 ENCOUNTER — Encounter (HOSPITAL_BASED_OUTPATIENT_CLINIC_OR_DEPARTMENT_OTHER): Payer: Self-pay | Admitting: Emergency Medicine

## 2022-02-15 ENCOUNTER — Other Ambulatory Visit: Payer: Self-pay

## 2022-02-15 ENCOUNTER — Telehealth: Payer: Self-pay

## 2022-02-15 DIAGNOSIS — D638 Anemia in other chronic diseases classified elsewhere: Secondary | ICD-10-CM | POA: Diagnosis not present

## 2022-02-15 DIAGNOSIS — I10 Essential (primary) hypertension: Secondary | ICD-10-CM | POA: Diagnosis not present

## 2022-02-15 DIAGNOSIS — D649 Anemia, unspecified: Secondary | ICD-10-CM | POA: Diagnosis not present

## 2022-02-15 DIAGNOSIS — R911 Solitary pulmonary nodule: Secondary | ICD-10-CM | POA: Insufficient documentation

## 2022-02-15 DIAGNOSIS — E039 Hypothyroidism, unspecified: Secondary | ICD-10-CM | POA: Insufficient documentation

## 2022-02-15 DIAGNOSIS — J9611 Chronic respiratory failure with hypoxia: Secondary | ICD-10-CM | POA: Diagnosis not present

## 2022-02-15 DIAGNOSIS — J984 Other disorders of lung: Secondary | ICD-10-CM

## 2022-02-15 DIAGNOSIS — R918 Other nonspecific abnormal finding of lung field: Secondary | ICD-10-CM | POA: Diagnosis not present

## 2022-02-15 DIAGNOSIS — Z881 Allergy status to other antibiotic agents status: Secondary | ICD-10-CM | POA: Diagnosis not present

## 2022-02-15 DIAGNOSIS — J449 Chronic obstructive pulmonary disease, unspecified: Secondary | ICD-10-CM | POA: Insufficient documentation

## 2022-02-15 DIAGNOSIS — J439 Emphysema, unspecified: Secondary | ICD-10-CM | POA: Diagnosis not present

## 2022-02-15 DIAGNOSIS — Z882 Allergy status to sulfonamides status: Secondary | ICD-10-CM | POA: Diagnosis not present

## 2022-02-15 DIAGNOSIS — R790 Abnormal level of blood mineral: Secondary | ICD-10-CM | POA: Diagnosis present

## 2022-02-15 DIAGNOSIS — A310 Pulmonary mycobacterial infection: Secondary | ICD-10-CM | POA: Diagnosis not present

## 2022-02-15 DIAGNOSIS — F17210 Nicotine dependence, cigarettes, uncomplicated: Secondary | ICD-10-CM | POA: Diagnosis not present

## 2022-02-15 DIAGNOSIS — Z79899 Other long term (current) drug therapy: Secondary | ICD-10-CM | POA: Diagnosis not present

## 2022-02-15 DIAGNOSIS — J432 Centrilobular emphysema: Secondary | ICD-10-CM | POA: Diagnosis not present

## 2022-02-15 NOTE — ED Provider Notes (Signed)
?[facility] EMERGENCY DEPARTMENT ?Provider Note ? ? ?CSN: [id number] ?Arrival date & time: 02/15/22  1139 ? ?  ? ?History ? ?Chief Complaint  ?Patient presents with  ? Abnormal labs  ? ? ?Claire [name] is a 71 y.o. female. ? ?Pt is a 71 yo female with COPD on 2L oxygen, TB exposure, htn, ptsd, anxiety, gerd, hypothyroidism, and a cavitary lesion on her lung.  Pt went to ID yesterday for further treatment.  She was told she may have cavitary MAC vs lung cancer.  Pt wanted to hold off on the 4 drug treatment for the MAC.  She had blood work done while there which came back with a hgb of 6.7.  She was told to go to the emergency department to get blood.  Unfortunately, she came here for a blood transfusion and we only have 1 or 2 units of emergency release blood.  She is tired, but is always tired. ?  ? ? ? ?  ? ?Home Medications ?Prior to Admission medications   ?Medication Sig Start Date End Date Taking? Authorizing Provider  ?albuterol (VENTOLIN HFA) 108 (90 Base) MCG/ACT inhaler INHALE 1-2 PUFFS INTO THE LUNGS EVERY 2 HOURS AS NEEDED FOR WHEEZING OR SHORTNESS OF BREATH. ?Patient taking differently: Inhale 1-2 puffs into the lungs every 2 (two) hours as needed for wheezing or shortness of breath. 01/04/22   Donella Stade, PA-C  ?AMBULATORY NON FORMULARY MEDICATION Continuous stationary and portable oxygen tanks for use with ambulation. DX: COPD with hypoxia. 07/16/18   Donella Stade, PA-C  ?AMBULATORY NON FORMULARY MEDICATION Medication Name: full face mask to use with oxygen 08/25/21   Donella Stade, PA-C  ?AMBULATORY NON FORMULARY MEDICATION Full face mask for night time O2 ?DX: J96.11 01/20/22   Breeback, Luvenia Starch L, PA-C  ?atenolol (TENORMIN) 25 MG tablet Take 1 tablet (25 mg total) by mouth 2 (two) times daily. ?Patient taking differently: Take 50 mg by mouth 2 (two) times daily. 11/19/21   Donella Stade, PA-C  ?cetirizine (ZYRTEC) 10 MG chewable tablet Chew 10 mg by mouth daily. ?Patient not  taking: Reported on 02/14/2022    [provider]  ?cholecalciferol (VITAMIN D) 25 MCG (1000 UT) tablet Take 1,000 Units by mouth daily.    [provider]  ?clonazePAM (KLONOPIN) 0.5 MG tablet Take 1 tablet (0.5 mg total) by mouth 3 (three) times daily. 02/07/22   Merian Capron, MD  ?dicyclomine (BENTYL) 10 MG capsule Take 10-20 mg by mouth in the morning and at bedtime.    [provider]  ?estradiol (ESTRACE) 0.1 MG/GM vaginal cream PLACE ONE APPLICATOR FULL THREE TIMES A WEEK AND ONE PEA SIZED AMOUNT APPLIED TO URETHRAL OPENING ?Patient taking differently: Place 1 Applicatorful vaginally 3 (three) times a week. 11/12/21   Breeback, Jade L, PA-C  ?famotidine (PEPCID) 20 MG tablet TAKE 1 TABLET BY MOUTH TWICE A DAY ?Patient taking differently: Take 20 mg by mouth daily as needed for heartburn or indigestion. 07/01/19   Breeback, Royetta Car, PA-C  ?ferrous sulfate 325 (65 FE) MG EC tablet 1 tab PO TID every other day ?Patient taking differently: Take 325 mg by mouth See admin instructions. 1 t TID every other day 07/13/17   Trixie Dredge, PA-C  ?ipratropium (ATROVENT) 0.06 % nasal spray Place 2 sprays into both nostrils 4 (four) times daily. ?Patient not taking: Reported on 02/14/2022 12/20/21   Iran Planas L, PA-C  ?ipratropium-albuterol (DUONEB) 0.5-2.5 (3) MG/3ML SOLN Take 3  mLs by nebulization every 4 (four) hours as needed (wheeze, SOB). 01/10/22 02/09/22  Pokhrel, Corrie Mckusick, MD  ?Lactobacillus (FLORAJEN ACIDOPHILUS) CAPS Take 1 capsule by mouth daily.    [provider]  ?levothyroxine (SYNTHROID) 75 MCG tablet Take 1 tablet (75 mcg total) by mouth daily. 11/03/21   Breeback, Luvenia Starch L, PA-C  ?nystatin (MYCOSTATIN) 100000 UNIT/ML suspension Take 5 mLs (500,000 Units total) by mouth 4 (four) times daily. ?Patient not taking: Reported on 02/14/2022 01/17/22   Donella Stade, PA-C  ?   ? ?Allergies    ?Bactrim [sulfamethoxazole-trimethoprim], Macrodantin [nitrofurantoin  macrocrystal], Medrol [methylprednisolone], Trimethoprim, and Zithromax [azithromycin]   ? ?Review of Systems   ?Review of Systems  ?Constitutional:  Positive for fatigue.  ?All other systems reviewed and are negative. ? ?Physical Exam ?Updated Vital Signs ?BP 121/78 (BP Location: Left Arm)   Pulse 81   Temp 97.7 ?F (36.5 ?C) (Oral)   Resp (!) 22   Ht _0  (1.6 m)   Wt 39.9 kg   SpO2 95% Comment: 2L [place]  BMI 15.59 kg/m?  ?Physical Exam ?Vitals and nursing note reviewed.  ?Constitutional:   ?   Appearance: She is cachectic.  ?HENT:  ?   Right Ear: External ear normal.  ?   Left Ear: External ear normal.  ?   Nose: Nose normal.  ?   Mouth/Throat:  ?   Mouth: Mucous membranes are moist.  ?   Pharynx: Oropharynx is clear.  ?Eyes:  ?   Extraocular Movements: Extraocular movements intact.  ?   Conjunctiva/sclera: Conjunctivae normal.  ?   Pupils: Pupils are equal, round, and reactive to light.  ?Cardiovascular:  ?   Rate and Rhythm: Normal rate and regular rhythm.  ?   Pulses: Normal pulses.  ?   Heart sounds: Normal heart sounds.  ?Pulmonary:  ?   Effort: Pulmonary effort is normal.  ?   Breath sounds: Normal breath sounds.  ?Abdominal:  ?   General: Abdomen is flat. Bowel sounds are normal.  ?   Palpations: Abdomen is soft.  ?Musculoskeletal:     ?   General: Normal range of motion.  ?   Cervical back: Normal range of motion and neck supple.  ?Skin: ?   General: Skin is warm.  ?   Capillary Refill: Capillary refill takes less than 2 seconds.  ?Neurological:  ?   General: No focal deficit present.  ?   Mental Status: She is alert and oriented to person, place, and time.  ?Psychiatric:     ?   Mood and Affect: Mood normal.     ?   Behavior: Behavior normal.  ? ? ?ED Results / Procedures / Treatments   ?Labs ?(all labs ordered are listed, but only abnormal results are displayed) ?Labs Reviewed  ?CBC WITH DIFFERENTIAL/PLATELET  ?COMPREHENSIVE METABOLIC PANEL  ?LIPASE, BLOOD  ?URINALYSIS, ROUTINE W REFLEX MICROSCOPIC   ?TYPE AND SCREEN  ? ? ?EKG ?None ? ?Radiology ?No results found. ? ?Procedures ?Procedures  ? ? ?Medications Ordered in ED ?Medications - No data to display ? ?ED Course/ Medical Decision Making/ A&P ?  ?                        ?Medical Decision Making ? ?This patient presents to the ED for concern of anemia, this involves an extensive number of treatment options, and is a complaint that carries with it a high risk of complications and morbidity.  The differential diagnosis includes symptomatic anemia, possible gi bleed, anemia due to chronic disease ? ? ?Co morbidities that complicate the patient evaluation ? ?COPD on 2L oxygen, TB exposure, htn, ptsd, anxiety, gerd, hypothyroidism, and a cavitary lesion on her lung ? ? ?Additional history obtained: ? ?Additional history obtained from epic chart review ?External records from outside source obtained and reviewed including son and daughter ? ? ?Lab Tests: ? ?I personally interpreted labs that were done yuesterday .  The pertinent results include:  hgb of 6.7 (hgb 8.3 on 2/22), CMP with BUN of 27.   ? ? ? ?Cardiac Monitoring: ? ?The patient was maintained on a cardiac monitor.  I personally viewed and interpreted the cardiac monitored which showed an underlying rhythm of: nsr ? ? ? ?Problem List / ED Course: ? ?Symptomatic anemia:  unfortunately, we do not carry blood for tranfusion here in our free standing ED.  I offered to arrange for her to be transferred via her son POV or by EMS to [facility] so she could get it there.  She refused this.  She said she is fed up with [facility] doctors and wants to go to [place].  She will leave with her son.  Pt knows she can return at any time to any of the [facility] hospitals. ? ? ?Social Determinants of Health: ? ?Lives at home with family ? ? ?Dispostion: ? ?After consideration of the diagnostic results and the patients response to treatment, I feel that the patent would benefit from transfer to [facility].  Pt refused.  She is awake and alert  and able to make decisions.   ? ? ? ? ? ? ? ?Final Clinical Impression(s) / ED Diagnoses ?Final diagnoses:  ?Symptomatic anemia  ?Cavitary lesion of lung  ? ? ?Rx / DC Orders ?ED Discharge Orders   ? ? None  ? ?  ? ? ?  ?Hav

## 2022-02-15 NOTE — Telephone Encounter (Signed)
Received call today from [facility] with low hemoglobin results 6.7. Spoke with Dr. Thedore Mins who recommended patient go to ED for reevaluation and infusion.  ?Spoke with patient's daughter and requested they report to ED regarding labs. Verbalized understanding.  ?Juanita Laster, RMA ? ?

## 2022-02-15 NOTE — ED Notes (Signed)
Patient has strongly voiced that she wishes not to rec care with [facility] system. She stated that she wants her son to take her to the [facility] Emergency Dept/ [facility]. Spoke with pt at length, tried to answer all of her questions and address all her concerns regards to her rec care at this ED and at another ED. Again, pt has made the decision to proceed with her son taking her by POV  to the ED of her choice.  ?

## 2022-02-15 NOTE — ED Triage Notes (Signed)
Pt arrives pov, to triage in wheelchair, c/o abnormal labs, sent to ED per patient for low hgb. Pt on 2L o2. Pt reports abdominal pain with nausea. ?

## 2022-02-16 ENCOUNTER — Ambulatory Visit: Payer: Medicare Other | Admitting: Physician Assistant

## 2022-02-16 ENCOUNTER — Encounter: Payer: Self-pay | Admitting: Pulmonary Disease

## 2022-02-16 DIAGNOSIS — D510 Vitamin B12 deficiency anemia due to intrinsic factor deficiency: Secondary | ICD-10-CM | POA: Diagnosis not present

## 2022-02-16 DIAGNOSIS — N1832 Chronic kidney disease, stage 3b: Secondary | ICD-10-CM | POA: Diagnosis not present

## 2022-02-16 DIAGNOSIS — J9611 Chronic respiratory failure with hypoxia: Secondary | ICD-10-CM | POA: Diagnosis not present

## 2022-02-16 DIAGNOSIS — I129 Hypertensive chronic kidney disease with stage 1 through stage 4 chronic kidney disease, or unspecified chronic kidney disease: Secondary | ICD-10-CM | POA: Diagnosis not present

## 2022-02-16 DIAGNOSIS — J432 Centrilobular emphysema: Secondary | ICD-10-CM | POA: Diagnosis not present

## 2022-02-17 DIAGNOSIS — R911 Solitary pulmonary nodule: Secondary | ICD-10-CM | POA: Diagnosis not present

## 2022-02-17 DIAGNOSIS — Z20822 Contact with and (suspected) exposure to covid-19: Secondary | ICD-10-CM | POA: Diagnosis not present

## 2022-02-17 DIAGNOSIS — T50901A Poisoning by unspecified drugs, medicaments and biological substances, accidental (unintentional), initial encounter: Secondary | ICD-10-CM | POA: Diagnosis not present

## 2022-02-17 DIAGNOSIS — Z79899 Other long term (current) drug therapy: Secondary | ICD-10-CM | POA: Diagnosis not present

## 2022-02-17 DIAGNOSIS — T50904A Poisoning by unspecified drugs, medicaments and biological substances, undetermined, initial encounter: Secondary | ICD-10-CM | POA: Diagnosis not present

## 2022-02-17 DIAGNOSIS — R410 Disorientation, unspecified: Secondary | ICD-10-CM | POA: Diagnosis not present

## 2022-02-17 DIAGNOSIS — J929 Pleural plaque without asbestos: Secondary | ICD-10-CM | POA: Diagnosis not present

## 2022-02-17 DIAGNOSIS — R0689 Other abnormalities of breathing: Secondary | ICD-10-CM | POA: Diagnosis not present

## 2022-02-17 DIAGNOSIS — Z888 Allergy status to other drugs, medicaments and biological substances status: Secondary | ICD-10-CM | POA: Diagnosis not present

## 2022-02-17 DIAGNOSIS — J439 Emphysema, unspecified: Secondary | ICD-10-CM | POA: Diagnosis not present

## 2022-02-17 DIAGNOSIS — T424X1A Poisoning by benzodiazepines, accidental (unintentional), initial encounter: Secondary | ICD-10-CM | POA: Diagnosis not present

## 2022-02-17 DIAGNOSIS — I10 Essential (primary) hypertension: Secondary | ICD-10-CM | POA: Diagnosis not present

## 2022-02-17 DIAGNOSIS — Z743 Need for continuous supervision: Secondary | ICD-10-CM | POA: Diagnosis not present

## 2022-02-17 DIAGNOSIS — T887XXA Unspecified adverse effect of drug or medicament, initial encounter: Secondary | ICD-10-CM | POA: Diagnosis not present

## 2022-02-17 DIAGNOSIS — R0981 Nasal congestion: Secondary | ICD-10-CM | POA: Diagnosis not present

## 2022-02-17 DIAGNOSIS — Z881 Allergy status to other antibiotic agents status: Secondary | ICD-10-CM | POA: Diagnosis not present

## 2022-02-17 DIAGNOSIS — F17210 Nicotine dependence, cigarettes, uncomplicated: Secondary | ICD-10-CM | POA: Diagnosis not present

## 2022-02-17 DIAGNOSIS — Z883 Allergy status to other anti-infective agents status: Secondary | ICD-10-CM | POA: Diagnosis not present

## 2022-02-17 DIAGNOSIS — R9431 Abnormal electrocardiogram [ECG] [EKG]: Secondary | ICD-10-CM | POA: Diagnosis not present

## 2022-02-17 DIAGNOSIS — E039 Hypothyroidism, unspecified: Secondary | ICD-10-CM | POA: Diagnosis not present

## 2022-02-18 ENCOUNTER — Ambulatory Visit: Payer: Medicare Other | Admitting: Internal Medicine

## 2022-02-18 LAB — COMPLETE METABOLIC PANEL WITH GFR
AG Ratio: 0.6 (calc) — ABNORMAL LOW (ref 1.0–2.5)
ALT: 9 U/L (ref 6–29)
AST: 11 U/L (ref 10–35)
Albumin: 3.1 g/dL — ABNORMAL LOW (ref 3.6–5.1)
Alkaline phosphatase (APISO): 63 U/L (ref 37–153)
BUN/Creatinine Ratio: 30 (calc) — ABNORMAL HIGH (ref 6–22)
BUN: 27 mg/dL — ABNORMAL HIGH (ref 7–25)
CO2: 33 mmol/L — ABNORMAL HIGH (ref 20–32)
Calcium: 9.3 mg/dL (ref 8.6–10.4)
Chloride: 92 mmol/L — ABNORMAL LOW (ref 98–110)
Creat: 0.91 mg/dL (ref 0.60–1.00)
Globulin: 5.3 g/dL (calc) — ABNORMAL HIGH (ref 1.9–3.7)
Glucose, Bld: 84 mg/dL (ref 65–99)
Potassium: 4.8 mmol/L (ref 3.5–5.3)
Sodium: 132 mmol/L — ABNORMAL LOW (ref 135–146)
Total Bilirubin: 0.2 mg/dL (ref 0.2–1.2)
Total Protein: 8.4 g/dL — ABNORMAL HIGH (ref 6.1–8.1)
eGFR: 67 mL/min/{1.73_m2} (ref >=60)

## 2022-02-18 LAB — CBC WITH DIFFERENTIAL/PLATELET
Absolute Monocytes: 1250 cells/uL — ABNORMAL HIGH (ref 200–950)
Basophils Absolute: 53 cells/uL (ref 0–200)
Basophils Relative: 0.4 %
Eosinophils Absolute: 532 cells/uL — ABNORMAL HIGH (ref 15–500)
Eosinophils Relative: 4 %
HCT: 21.2 % — ABNORMAL LOW (ref 35.0–45.0)
Hemoglobin: 6.7 g/dL — ABNORMAL LOW (ref 11.7–15.5)
Lymphs Abs: 1117 cells/uL (ref 850–3900)
MCH: 29.4 pg (ref 27.0–33.0)
MCHC: 31.6 g/dL — ABNORMAL LOW (ref 32.0–36.0)
MCV: 93 fL (ref 80.0–100.0)
MPV: 9 fL (ref 7.5–12.5)
Monocytes Relative: 9.4 %
Neutro Abs: 10347 cells/uL — ABNORMAL HIGH (ref 1500–7800)
Neutrophils Relative %: 77.8 %
Platelets: 444 10*3/uL — ABNORMAL HIGH (ref 140–400)
RBC: 2.28 10*6/uL — ABNORMAL LOW (ref 3.80–5.10)
RDW: 12.7 % (ref 11.0–15.0)
Total Lymphocyte: 8.4 %
WBC: 13.3 10*3/uL — ABNORMAL HIGH (ref 3.8–10.8)

## 2022-02-18 LAB — QUANTIFERON-TB GOLD PLUS
Mitogen-NIL: 2.4 IU/mL
NIL: 0.05 IU/mL
QuantiFERON-TB Gold Plus: NEGATIVE
TB1-NIL: <0 IU/mL
TB2-NIL: <0 IU/mL

## 2022-02-20 DIAGNOSIS — R0902 Hypoxemia: Secondary | ICD-10-CM | POA: Diagnosis not present

## 2022-02-21 ENCOUNTER — Telehealth: Payer: Self-pay | Admitting: *Deleted

## 2022-02-21 ENCOUNTER — Ambulatory Visit: Payer: Medicare Other | Admitting: Internal Medicine

## 2022-02-21 NOTE — Telephone Encounter (Signed)
1:08p ?Received a voicemail from patient's daughter stating that she needs to cancel the visit with [facility] NP for 02/24/22.  ? ?3:08p ?Returned call to patient's daughter. She says that patient was involuntarily commited on 02/17/22. Patient got ahold of the rest of the Klonopin she had left in her bottle while her daughter was asleep and ingested them. They are moving her to [facility] health in the morning and she is unsure of how long they will keep her. Daughter has applied for guardianship. The plan is to try to have patient admitted to a skilled nursing facility when she leaves [place]. I told her to give [place] a call back with her disposition when she is discharged from [facility]. Daughter advised she will call. Update provided to [facility] NP. ? ?

## 2022-02-22 ENCOUNTER — Ambulatory Visit: Payer: Medicare Other | Admitting: Physician Assistant

## 2022-02-22 ENCOUNTER — Ambulatory Visit (HOSPITAL_COMMUNITY): Payer: Self-pay | Admitting: Psychiatry

## 2022-02-22 ENCOUNTER — Telehealth: Payer: Self-pay | Admitting: Neurology

## 2022-02-22 DIAGNOSIS — Z743 Need for continuous supervision: Secondary | ICD-10-CM | POA: Diagnosis not present

## 2022-02-22 DIAGNOSIS — R404 Transient alteration of awareness: Secondary | ICD-10-CM | POA: Diagnosis not present

## 2022-02-22 NOTE — Telephone Encounter (Signed)
Ok you can let patient know.

## 2022-02-22 NOTE — Telephone Encounter (Signed)
Tess with Dr. Gabriela Eves office called back and states he is okay with patient using Entocort along with Augmentin. FYI. CB number if needed [phone]. ?

## 2022-02-23 ENCOUNTER — Telehealth: Payer: Self-pay | Admitting: Physician Assistant

## 2022-02-23 DIAGNOSIS — J439 Emphysema, unspecified: Secondary | ICD-10-CM | POA: Diagnosis not present

## 2022-02-23 DIAGNOSIS — D638 Anemia in other chronic diseases classified elsewhere: Secondary | ICD-10-CM | POA: Diagnosis not present

## 2022-02-23 DIAGNOSIS — E559 Vitamin D deficiency, unspecified: Secondary | ICD-10-CM | POA: Diagnosis not present

## 2022-02-23 DIAGNOSIS — N3000 Acute cystitis without hematuria: Secondary | ICD-10-CM | POA: Diagnosis not present

## 2022-02-23 DIAGNOSIS — I10 Essential (primary) hypertension: Secondary | ICD-10-CM | POA: Diagnosis not present

## 2022-02-23 DIAGNOSIS — R636 Underweight: Secondary | ICD-10-CM | POA: Diagnosis not present

## 2022-02-23 DIAGNOSIS — R918 Other nonspecific abnormal finding of lung field: Secondary | ICD-10-CM | POA: Diagnosis not present

## 2022-02-23 DIAGNOSIS — J449 Chronic obstructive pulmonary disease, unspecified: Secondary | ICD-10-CM | POA: Diagnosis not present

## 2022-02-23 DIAGNOSIS — K580 Irritable bowel syndrome with diarrhea: Secondary | ICD-10-CM | POA: Diagnosis not present

## 2022-02-23 DIAGNOSIS — K219 Gastro-esophageal reflux disease without esophagitis: Secondary | ICD-10-CM | POA: Diagnosis not present

## 2022-02-23 DIAGNOSIS — I2510 Atherosclerotic heart disease of native coronary artery without angina pectoris: Secondary | ICD-10-CM | POA: Diagnosis not present

## 2022-02-23 DIAGNOSIS — E871 Hypo-osmolality and hyponatremia: Secondary | ICD-10-CM | POA: Diagnosis not present

## 2022-02-23 DIAGNOSIS — E039 Hypothyroidism, unspecified: Secondary | ICD-10-CM | POA: Diagnosis not present

## 2022-02-23 NOTE — Telephone Encounter (Signed)
Patient currently admitted to [facility]. Will wait until she is home. See other message sent today from daughter.  ?

## 2022-02-23 NOTE — Telephone Encounter (Signed)
Spoke with patient's daughter and she states sodium was low before transporting to [facility] center. They aren't addressing this at all. She is afraid she is going to get medically worse. She does want to pick her up and take her somewhere else but she does not have power of attorney yet. She has applied for emergency guardianship but the court date is not until the beginning of April. She wants to know if she is able to pick her up AMA if she under involuntary hold and she doesn't have power of attorney?  ?

## 2022-02-23 NOTE — Telephone Encounter (Signed)
Pt's daughter called and said Claire [name] was committed to [facility] [phone]. She said she has low sodium - 122. She is concerned because they are not returning her call and she wants to have her mom transferred to a [facility] Facility because she is concerned they are focusing more on her mental health than her physical. ?

## 2022-02-23 NOTE — Telephone Encounter (Signed)
Tried to call back to speak with daughter, no answer.  ?

## 2022-02-24 ENCOUNTER — Other Ambulatory Visit: Payer: Medicare Other | Admitting: Hospice

## 2022-02-24 ENCOUNTER — Other Ambulatory Visit: Payer: Self-pay | Admitting: Physician Assistant

## 2022-02-24 DIAGNOSIS — R296 Repeated falls: Secondary | ICD-10-CM | POA: Diagnosis not present

## 2022-02-24 DIAGNOSIS — Z79899 Other long term (current) drug therapy: Secondary | ICD-10-CM | POA: Diagnosis not present

## 2022-02-24 DIAGNOSIS — D649 Anemia, unspecified: Secondary | ICD-10-CM | POA: Diagnosis not present

## 2022-02-24 DIAGNOSIS — G9340 Encephalopathy, unspecified: Secondary | ICD-10-CM | POA: Diagnosis not present

## 2022-02-24 DIAGNOSIS — Z7189 Other specified counseling: Secondary | ICD-10-CM | POA: Diagnosis not present

## 2022-02-24 DIAGNOSIS — J9611 Chronic respiratory failure with hypoxia: Secondary | ICD-10-CM | POA: Diagnosis not present

## 2022-02-24 DIAGNOSIS — D72829 Elevated white blood cell count, unspecified: Secondary | ICD-10-CM | POA: Diagnosis not present

## 2022-02-24 DIAGNOSIS — E039 Hypothyroidism, unspecified: Secondary | ICD-10-CM | POA: Diagnosis not present

## 2022-02-24 DIAGNOSIS — J9621 Acute and chronic respiratory failure with hypoxia: Secondary | ICD-10-CM | POA: Diagnosis not present

## 2022-02-24 DIAGNOSIS — E43 Unspecified severe protein-calorie malnutrition: Secondary | ICD-10-CM | POA: Diagnosis not present

## 2022-02-24 DIAGNOSIS — G319 Degenerative disease of nervous system, unspecified: Secondary | ICD-10-CM | POA: Diagnosis not present

## 2022-02-24 DIAGNOSIS — M5136 Other intervertebral disc degeneration, lumbar region: Secondary | ICD-10-CM | POA: Diagnosis not present

## 2022-02-24 DIAGNOSIS — R0603 Acute respiratory distress: Secondary | ICD-10-CM | POA: Diagnosis not present

## 2022-02-24 DIAGNOSIS — R627 Adult failure to thrive: Secondary | ICD-10-CM | POA: Diagnosis not present

## 2022-02-24 DIAGNOSIS — J432 Centrilobular emphysema: Secondary | ICD-10-CM | POA: Diagnosis not present

## 2022-02-24 DIAGNOSIS — N3000 Acute cystitis without hematuria: Secondary | ICD-10-CM | POA: Diagnosis not present

## 2022-02-24 DIAGNOSIS — M2578 Osteophyte, vertebrae: Secondary | ICD-10-CM | POA: Diagnosis not present

## 2022-02-24 DIAGNOSIS — G9341 Metabolic encephalopathy: Secondary | ICD-10-CM | POA: Diagnosis not present

## 2022-02-24 DIAGNOSIS — E876 Hypokalemia: Secondary | ICD-10-CM | POA: Diagnosis not present

## 2022-02-24 DIAGNOSIS — Z515 Encounter for palliative care: Secondary | ICD-10-CM | POA: Diagnosis not present

## 2022-02-24 DIAGNOSIS — D638 Anemia in other chronic diseases classified elsewhere: Secondary | ICD-10-CM | POA: Diagnosis not present

## 2022-02-24 DIAGNOSIS — A310 Pulmonary mycobacterial infection: Secondary | ICD-10-CM | POA: Diagnosis not present

## 2022-02-24 DIAGNOSIS — F17210 Nicotine dependence, cigarettes, uncomplicated: Secondary | ICD-10-CM | POA: Diagnosis not present

## 2022-02-24 DIAGNOSIS — I10 Essential (primary) hypertension: Secondary | ICD-10-CM | POA: Diagnosis not present

## 2022-02-24 DIAGNOSIS — R918 Other nonspecific abnormal finding of lung field: Secondary | ICD-10-CM | POA: Diagnosis not present

## 2022-02-24 DIAGNOSIS — R911 Solitary pulmonary nodule: Secondary | ICD-10-CM | POA: Diagnosis not present

## 2022-02-24 DIAGNOSIS — J9622 Acute and chronic respiratory failure with hypercapnia: Secondary | ICD-10-CM | POA: Diagnosis not present

## 2022-02-24 DIAGNOSIS — J449 Chronic obstructive pulmonary disease, unspecified: Secondary | ICD-10-CM | POA: Diagnosis not present

## 2022-02-24 DIAGNOSIS — R0602 Shortness of breath: Secondary | ICD-10-CM | POA: Diagnosis not present

## 2022-02-24 DIAGNOSIS — K219 Gastro-esophageal reflux disease without esophagitis: Secondary | ICD-10-CM | POA: Diagnosis not present

## 2022-02-24 DIAGNOSIS — J984 Other disorders of lung: Secondary | ICD-10-CM | POA: Diagnosis not present

## 2022-02-24 DIAGNOSIS — Z681 Body mass index (BMI) 19 or less, adult: Secondary | ICD-10-CM | POA: Diagnosis not present

## 2022-02-24 DIAGNOSIS — K589 Irritable bowel syndrome without diarrhea: Secondary | ICD-10-CM | POA: Diagnosis not present

## 2022-02-24 DIAGNOSIS — R059 Cough, unspecified: Secondary | ICD-10-CM | POA: Diagnosis not present

## 2022-02-24 DIAGNOSIS — R9082 White matter disease, unspecified: Secondary | ICD-10-CM | POA: Diagnosis not present

## 2022-02-24 DIAGNOSIS — J431 Panlobular emphysema: Secondary | ICD-10-CM | POA: Diagnosis not present

## 2022-02-24 DIAGNOSIS — E871 Hypo-osmolality and hyponatremia: Secondary | ICD-10-CM | POA: Diagnosis not present

## 2022-02-24 DIAGNOSIS — M5386 Other specified dorsopathies, lumbar region: Secondary | ICD-10-CM | POA: Diagnosis not present

## 2022-02-24 DIAGNOSIS — M25512 Pain in left shoulder: Secondary | ICD-10-CM | POA: Diagnosis not present

## 2022-02-24 NOTE — Telephone Encounter (Signed)
Spoke with patient's daughter. She did speak with people at the hospital. They were able to move patient to a medical ward to address low sodium. They questioned getting a 24 hour care facility once she is released and I advised to talk to a social worker at the hospital about getting that completed since she will have at least a 3 day stay in the hospital which will make that transition easier. She will call with any other questions/issues.  ?

## 2022-02-25 DIAGNOSIS — E871 Hypo-osmolality and hyponatremia: Secondary | ICD-10-CM | POA: Diagnosis not present

## 2022-02-25 DIAGNOSIS — A310 Pulmonary mycobacterial infection: Secondary | ICD-10-CM | POA: Diagnosis not present

## 2022-02-25 DIAGNOSIS — J9611 Chronic respiratory failure with hypoxia: Secondary | ICD-10-CM | POA: Diagnosis not present

## 2022-02-26 DIAGNOSIS — E871 Hypo-osmolality and hyponatremia: Secondary | ICD-10-CM | POA: Diagnosis not present

## 2022-02-26 DIAGNOSIS — A310 Pulmonary mycobacterial infection: Secondary | ICD-10-CM | POA: Diagnosis not present

## 2022-02-26 DIAGNOSIS — J9611 Chronic respiratory failure with hypoxia: Secondary | ICD-10-CM | POA: Diagnosis not present

## 2022-02-27 DIAGNOSIS — J9611 Chronic respiratory failure with hypoxia: Secondary | ICD-10-CM | POA: Diagnosis not present

## 2022-02-27 DIAGNOSIS — E871 Hypo-osmolality and hyponatremia: Secondary | ICD-10-CM | POA: Diagnosis not present

## 2022-02-27 DIAGNOSIS — A310 Pulmonary mycobacterial infection: Secondary | ICD-10-CM | POA: Diagnosis not present

## 2022-02-28 ENCOUNTER — Telehealth: Payer: Self-pay | Admitting: Neurology

## 2022-02-28 DIAGNOSIS — R0602 Shortness of breath: Secondary | ICD-10-CM | POA: Diagnosis not present

## 2022-02-28 DIAGNOSIS — J9611 Chronic respiratory failure with hypoxia: Secondary | ICD-10-CM | POA: Diagnosis not present

## 2022-02-28 DIAGNOSIS — J984 Other disorders of lung: Secondary | ICD-10-CM | POA: Diagnosis not present

## 2022-02-28 DIAGNOSIS — E871 Hypo-osmolality and hyponatremia: Secondary | ICD-10-CM | POA: Diagnosis not present

## 2022-02-28 DIAGNOSIS — A310 Pulmonary mycobacterial infection: Secondary | ICD-10-CM | POA: Diagnosis not present

## 2022-02-28 DIAGNOSIS — Z7189 Other specified counseling: Secondary | ICD-10-CM | POA: Diagnosis not present

## 2022-02-28 NOTE — Telephone Encounter (Signed)
LVM for patient's daughter to call back if needed.  ?

## 2022-02-28 NOTE — Telephone Encounter (Signed)
Patient's daughter called and needed some advise and guidance for patient.  ?

## 2022-03-01 DIAGNOSIS — E871 Hypo-osmolality and hyponatremia: Secondary | ICD-10-CM | POA: Diagnosis not present

## 2022-03-01 DIAGNOSIS — A310 Pulmonary mycobacterial infection: Secondary | ICD-10-CM | POA: Diagnosis not present

## 2022-03-01 DIAGNOSIS — J9611 Chronic respiratory failure with hypoxia: Secondary | ICD-10-CM | POA: Diagnosis not present

## 2022-03-01 DIAGNOSIS — J984 Other disorders of lung: Secondary | ICD-10-CM | POA: Diagnosis not present

## 2022-03-01 DIAGNOSIS — Z7189 Other specified counseling: Secondary | ICD-10-CM | POA: Diagnosis not present

## 2022-03-01 DIAGNOSIS — R0602 Shortness of breath: Secondary | ICD-10-CM | POA: Diagnosis not present

## 2022-03-02 ENCOUNTER — Telehealth: Payer: Self-pay | Admitting: Pulmonary Disease

## 2022-03-03 DIAGNOSIS — J9622 Acute and chronic respiratory failure with hypercapnia: Secondary | ICD-10-CM | POA: Diagnosis not present

## 2022-03-03 DIAGNOSIS — Z79899 Other long term (current) drug therapy: Secondary | ICD-10-CM | POA: Diagnosis not present

## 2022-03-03 DIAGNOSIS — J984 Other disorders of lung: Secondary | ICD-10-CM | POA: Diagnosis not present

## 2022-03-03 DIAGNOSIS — A310 Pulmonary mycobacterial infection: Secondary | ICD-10-CM | POA: Diagnosis not present

## 2022-03-03 DIAGNOSIS — I10 Essential (primary) hypertension: Secondary | ICD-10-CM | POA: Diagnosis not present

## 2022-03-03 DIAGNOSIS — J432 Centrilobular emphysema: Secondary | ICD-10-CM | POA: Diagnosis not present

## 2022-03-03 DIAGNOSIS — F17210 Nicotine dependence, cigarettes, uncomplicated: Secondary | ICD-10-CM | POA: Diagnosis not present

## 2022-03-03 DIAGNOSIS — E871 Hypo-osmolality and hyponatremia: Secondary | ICD-10-CM | POA: Diagnosis not present

## 2022-03-03 DIAGNOSIS — J9621 Acute and chronic respiratory failure with hypoxia: Secondary | ICD-10-CM | POA: Diagnosis not present

## 2022-03-03 DIAGNOSIS — J449 Chronic obstructive pulmonary disease, unspecified: Secondary | ICD-10-CM | POA: Diagnosis not present

## 2022-03-03 NOTE — Telephone Encounter (Signed)
Called and spoke to pt's daughter, [name]. She states the pt was admitted to [facility] behavioral unit but her health status began worsening. Per [name] the pt was hypercarbic and [name] wanted pt on Bipap. Per [name] the staff refused to put pt on Bipap. Since [name] initially called on 3/29 at 3:04pm, the pt has been put on bipap and is now in ICU. [name] is denying needing anything further at this time.  ? ? ?There is not a DPR on file for pt. However, [name] states she and her brother are filing for guardianship next week as pt's mental/behavioral state has also been worsening.  ? ? ?Will forward to Dr. Everardo All as Lorain Childes. Nothing further needed at this time.  ? ?

## 2022-03-03 NOTE — Telephone Encounter (Signed)
Discussed with daughter patient's current condition. Reportedly patient overdosed. Has been recommended for hospice. Daughter disagrees. ? ?If she does not elect for Hospice, she may need to have NIV arranged prior to discharge however this will be depend on attending physician at [facility]. Provided emotional support. Difficult situation. ?

## 2022-03-04 ENCOUNTER — Encounter: Payer: Self-pay | Admitting: Pulmonary Disease

## 2022-03-04 NOTE — Telephone Encounter (Signed)
Received the following message from patient's daughter:  ? ?"Dr. Everardo All, ?This is [name]?s daughter. We spoke concerning trying to get Mom transferred to a [name] facility. A [facility] doctor requested the transfer but said the transfer was declined by a provider there. Do you know why that happened? " ? ?I attempted to search her chart for both [facility] and [facility] and could not find anything about a declined transfer.  ? ?JE, can you please advise? Thanks!  ?

## 2022-03-04 NOTE — Telephone Encounter (Signed)
Called back Claire [name]'s daughter. Discussed transfer decline. At the time of request the patient was reported to be ready for discharge in the next 72 hours so transfer was declined with plan for outpatient pulmonary follow-up. ? ?Daughter reports now that patient's WBC is worsening and currently undergoing work-up for sepsis. I encouraged for her to complete work-up and treatment at the current hospital and when Claire [name] is ready to be seen with me in clinic. She will not be able to start MAC treatment until she is treated for her current infection. Daughter expressed understanding and appreciated phone call. ? ?When patient/daughter calls in the future, ok to double book if needed to be seen. ?

## 2022-03-06 ENCOUNTER — Emergency Department (HOSPITAL_COMMUNITY): Payer: Medicare Other

## 2022-03-06 ENCOUNTER — Inpatient Hospital Stay (HOSPITAL_COMMUNITY)
Admission: EM | Admit: 2022-03-06 | Discharge: 2022-03-14 | DRG: 871 | Disposition: A | Discharge status: Skilled Nursing Facility | Payer: Medicare Other | Attending: Internal Medicine | Admitting: Internal Medicine

## 2022-03-06 DIAGNOSIS — I959 Hypotension, unspecified: Secondary | ICD-10-CM | POA: Diagnosis not present

## 2022-03-06 DIAGNOSIS — K219 Gastro-esophageal reflux disease without esophagitis: Secondary | ICD-10-CM | POA: Diagnosis present

## 2022-03-06 DIAGNOSIS — R54 Age-related physical debility: Secondary | ICD-10-CM | POA: Diagnosis present

## 2022-03-06 DIAGNOSIS — Z79890 Hormone replacement therapy (postmenopausal): Secondary | ICD-10-CM

## 2022-03-06 DIAGNOSIS — G9340 Encephalopathy, unspecified: Secondary | ICD-10-CM | POA: Diagnosis present

## 2022-03-06 DIAGNOSIS — Z5320 Procedure and treatment not carried out because of patient's decision for unspecified reasons: Secondary | ICD-10-CM | POA: Diagnosis not present

## 2022-03-06 DIAGNOSIS — Z881 Allergy status to other antibiotic agents status: Secondary | ICD-10-CM

## 2022-03-06 DIAGNOSIS — I10 Essential (primary) hypertension: Secondary | ICD-10-CM | POA: Diagnosis not present

## 2022-03-06 DIAGNOSIS — R64 Cachexia: Secondary | ICD-10-CM | POA: Diagnosis present

## 2022-03-06 DIAGNOSIS — E875 Hyperkalemia: Secondary | ICD-10-CM | POA: Diagnosis not present

## 2022-03-06 DIAGNOSIS — Z87440 Personal history of urinary (tract) infections: Secondary | ICD-10-CM | POA: Diagnosis not present

## 2022-03-06 DIAGNOSIS — J189 Pneumonia, unspecified organism: Secondary | ICD-10-CM | POA: Diagnosis not present

## 2022-03-06 DIAGNOSIS — J449 Chronic obstructive pulmonary disease, unspecified: Secondary | ICD-10-CM | POA: Diagnosis present

## 2022-03-06 DIAGNOSIS — R627 Adult failure to thrive: Secondary | ICD-10-CM | POA: Diagnosis not present

## 2022-03-06 DIAGNOSIS — Z7952 Long term (current) use of systemic steroids: Secondary | ICD-10-CM

## 2022-03-06 DIAGNOSIS — D638 Anemia in other chronic diseases classified elsewhere: Secondary | ICD-10-CM | POA: Diagnosis present

## 2022-03-06 DIAGNOSIS — Z91041 Radiographic dye allergy status: Secondary | ICD-10-CM

## 2022-03-06 DIAGNOSIS — J439 Emphysema, unspecified: Secondary | ICD-10-CM | POA: Diagnosis not present

## 2022-03-06 DIAGNOSIS — A310 Pulmonary mycobacterial infection: Secondary | ICD-10-CM | POA: Diagnosis present

## 2022-03-06 DIAGNOSIS — Z681 Body mass index (BMI) 19 or less, adult: Secondary | ICD-10-CM | POA: Diagnosis not present

## 2022-03-06 DIAGNOSIS — K521 Toxic gastroenteritis and colitis: Secondary | ICD-10-CM | POA: Diagnosis not present

## 2022-03-06 DIAGNOSIS — T3695XA Adverse effect of unspecified systemic antibiotic, initial encounter: Secondary | ICD-10-CM | POA: Diagnosis not present

## 2022-03-06 DIAGNOSIS — Z87891 Personal history of nicotine dependence: Secondary | ICD-10-CM

## 2022-03-06 DIAGNOSIS — E213 Hyperparathyroidism, unspecified: Secondary | ICD-10-CM | POA: Diagnosis present

## 2022-03-06 DIAGNOSIS — E039 Hypothyroidism, unspecified: Secondary | ICD-10-CM | POA: Diagnosis not present

## 2022-03-06 DIAGNOSIS — Z201 Contact with and (suspected) exposure to tuberculosis: Secondary | ICD-10-CM | POA: Diagnosis present

## 2022-03-06 DIAGNOSIS — Z20822 Contact with and (suspected) exposure to covid-19: Secondary | ICD-10-CM | POA: Diagnosis present

## 2022-03-06 DIAGNOSIS — E43 Unspecified severe protein-calorie malnutrition: Secondary | ICD-10-CM | POA: Diagnosis present

## 2022-03-06 DIAGNOSIS — J431 Panlobular emphysema: Secondary | ICD-10-CM | POA: Diagnosis present

## 2022-03-06 DIAGNOSIS — J9621 Acute and chronic respiratory failure with hypoxia: Secondary | ICD-10-CM | POA: Diagnosis present

## 2022-03-06 DIAGNOSIS — N289 Disorder of kidney and ureter, unspecified: Secondary | ICD-10-CM

## 2022-03-06 DIAGNOSIS — N179 Acute kidney failure, unspecified: Secondary | ICD-10-CM | POA: Diagnosis not present

## 2022-03-06 DIAGNOSIS — Z91199 Patient's noncompliance with other medical treatment and regimen due to unspecified reason: Secondary | ICD-10-CM

## 2022-03-06 DIAGNOSIS — E871 Hypo-osmolality and hyponatremia: Secondary | ICD-10-CM | POA: Diagnosis present

## 2022-03-06 DIAGNOSIS — Z79899 Other long term (current) drug therapy: Secondary | ICD-10-CM

## 2022-03-06 DIAGNOSIS — J9612 Chronic respiratory failure with hypercapnia: Secondary | ICD-10-CM | POA: Diagnosis not present

## 2022-03-06 DIAGNOSIS — N390 Urinary tract infection, site not specified: Secondary | ICD-10-CM | POA: Insufficient documentation

## 2022-03-06 DIAGNOSIS — F4310 Post-traumatic stress disorder, unspecified: Secondary | ICD-10-CM | POA: Diagnosis present

## 2022-03-06 DIAGNOSIS — A319 Mycobacterial infection, unspecified: Secondary | ICD-10-CM

## 2022-03-06 DIAGNOSIS — Z9981 Dependence on supplemental oxygen: Secondary | ICD-10-CM

## 2022-03-06 DIAGNOSIS — J9611 Chronic respiratory failure with hypoxia: Secondary | ICD-10-CM | POA: Diagnosis not present

## 2022-03-06 DIAGNOSIS — Z886 Allergy status to analgesic agent status: Secondary | ICD-10-CM

## 2022-03-06 DIAGNOSIS — F22 Delusional disorders: Secondary | ICD-10-CM | POA: Diagnosis present

## 2022-03-06 DIAGNOSIS — Z882 Allergy status to sulfonamides status: Secondary | ICD-10-CM

## 2022-03-06 DIAGNOSIS — Z28310 Unvaccinated for covid-19: Secondary | ICD-10-CM | POA: Diagnosis not present

## 2022-03-06 DIAGNOSIS — F419 Anxiety disorder, unspecified: Secondary | ICD-10-CM

## 2022-03-06 DIAGNOSIS — Z515 Encounter for palliative care: Secondary | ICD-10-CM | POA: Diagnosis not present

## 2022-03-06 DIAGNOSIS — F32A Depression, unspecified: Secondary | ICD-10-CM | POA: Diagnosis present

## 2022-03-06 DIAGNOSIS — Z9181 History of falling: Secondary | ICD-10-CM

## 2022-03-06 DIAGNOSIS — R0689 Other abnormalities of breathing: Secondary | ICD-10-CM | POA: Diagnosis not present

## 2022-03-06 DIAGNOSIS — Z743 Need for continuous supervision: Secondary | ICD-10-CM | POA: Diagnosis not present

## 2022-03-06 DIAGNOSIS — Z66 Do not resuscitate: Secondary | ICD-10-CM | POA: Diagnosis not present

## 2022-03-06 DIAGNOSIS — G9341 Metabolic encephalopathy: Secondary | ICD-10-CM | POA: Diagnosis not present

## 2022-03-06 DIAGNOSIS — R6889 Other general symptoms and signs: Secondary | ICD-10-CM | POA: Diagnosis not present

## 2022-03-06 DIAGNOSIS — J9622 Acute and chronic respiratory failure with hypercapnia: Secondary | ICD-10-CM | POA: Diagnosis not present

## 2022-03-06 DIAGNOSIS — R0602 Shortness of breath: Secondary | ICD-10-CM | POA: Diagnosis not present

## 2022-03-06 DIAGNOSIS — Z7189 Other specified counseling: Secondary | ICD-10-CM

## 2022-03-06 DIAGNOSIS — Z818 Family history of other mental and behavioral disorders: Secondary | ICD-10-CM

## 2022-03-06 DIAGNOSIS — A419 Sepsis, unspecified organism: Secondary | ICD-10-CM | POA: Diagnosis not present

## 2022-03-06 DIAGNOSIS — Z9151 Personal history of suicidal behavior: Secondary | ICD-10-CM

## 2022-03-06 HISTORY — DX: Encephalopathy, unspecified: G93.40

## 2022-03-06 LAB — CBC WITH DIFFERENTIAL/PLATELET
Abs Immature Granulocytes: 0.11 10*3/uL — ABNORMAL HIGH (ref 0.00–0.07)
Basophils Absolute: 0 10*3/uL (ref 0.0–0.1)
Basophils Relative: 0 %
Eosinophils Absolute: 0.3 10*3/uL (ref 0.0–0.5)
Eosinophils Relative: 2 %
HCT: 27.8 % — ABNORMAL LOW (ref 36.0–46.0)
Hemoglobin: 8.4 g/dL — ABNORMAL LOW (ref 12.0–15.0)
Immature Granulocytes: 1 %
Lymphocytes Relative: 7 %
Lymphs Abs: 1 10*3/uL (ref 0.7–4.0)
MCH: 30.5 pg (ref 26.0–34.0)
MCHC: 30.2 g/dL (ref 30.0–36.0)
MCV: 101.1 fL — ABNORMAL HIGH (ref 80.0–100.0)
Monocytes Absolute: 0.8 10*3/uL (ref 0.1–1.0)
Monocytes Relative: 6 %
Neutro Abs: 12.6 10*3/uL — ABNORMAL HIGH (ref 1.7–7.7)
Neutrophils Relative %: 84 %
Platelets: 300 10*3/uL (ref 150–400)
RBC: 2.75 MIL/uL — ABNORMAL LOW (ref 3.87–5.11)
RDW: 13.5 % (ref 11.5–15.5)
WBC: 14.9 10*3/uL — ABNORMAL HIGH (ref 4.0–10.5)
nRBC: 0 % (ref 0.0–0.2)

## 2022-03-06 LAB — COMPREHENSIVE METABOLIC PANEL
ALT: 12 U/L (ref 0–44)
AST: 18 U/L (ref 15–41)
Albumin: 2.3 g/dL — ABNORMAL LOW (ref 3.5–5.0)
Alkaline Phosphatase: 62 U/L (ref 38–126)
Anion gap: 10 (ref 5–15)
BUN: 18 mg/dL (ref 8–23)
CO2: 40 mmol/L — ABNORMAL HIGH (ref 22–32)
Calcium: 9.1 mg/dL (ref 8.9–10.3)
Chloride: 83 mmol/L — ABNORMAL LOW (ref 98–111)
Creatinine, Ser: 0.72 mg/dL (ref 0.44–1.00)
GFR, Estimated: >60 mL/min (ref >60)
Glucose, Bld: 92 mg/dL (ref 70–99)
Potassium: 3.5 mmol/L (ref 3.5–5.1)
Sodium: 133 mmol/L — ABNORMAL LOW (ref 135–145)
Total Bilirubin: 0.5 mg/dL (ref 0.3–1.2)
Total Protein: 8.6 g/dL — ABNORMAL HIGH (ref 6.5–8.1)

## 2022-03-06 LAB — MAGNESIUM: Magnesium: 1.3 mg/dL — ABNORMAL LOW (ref 1.7–2.4)

## 2022-03-06 LAB — I-STAT VENOUS BLOOD GAS, ED
Acid-Base Excess: 23 mmol/L — ABNORMAL HIGH (ref 0.0–2.0)
Bicarbonate: 49.7 mmol/L — ABNORMAL HIGH (ref 20.0–28.0)
Calcium, Ion: 1.17 mmol/L (ref 1.15–1.40)
HCT: 30 % — ABNORMAL LOW (ref 36.0–46.0)
Hemoglobin: 10.2 g/dL — ABNORMAL LOW (ref 12.0–15.0)
O2 Saturation: 77 %
Potassium: 3.4 mmol/L — ABNORMAL LOW (ref 3.5–5.1)
Sodium: 135 mmol/L (ref 135–145)
TCO2: >50 mmol/L — ABNORMAL HIGH (ref 22–32)
pCO2, Ven: 68 mmHg — ABNORMAL HIGH (ref 44–60)
pH, Ven: 7.472 — ABNORMAL HIGH (ref 7.25–7.43)
pO2, Ven: 41 mmHg (ref 32–45)

## 2022-03-06 LAB — AMMONIA: Ammonia: 28 umol/L (ref 9–35)

## 2022-03-06 LAB — TROPONIN I (HIGH SENSITIVITY)
Troponin I (High Sensitivity): 14 ng/L (ref <18)
Troponin I (High Sensitivity): 16 ng/L (ref <18)

## 2022-03-06 LAB — RESP PANEL BY RT-PCR (FLU A&B, COVID) ARPGX2
Influenza A by PCR: NEGATIVE
Influenza B by PCR: NEGATIVE
SARS Coronavirus 2 by RT PCR: NEGATIVE

## 2022-03-06 LAB — LIPASE, BLOOD: Lipase: 28 U/L (ref 11–51)

## 2022-03-06 LAB — TSH: TSH: 10.141 u[IU]/mL — ABNORMAL HIGH (ref 0.350–4.500)

## 2022-03-06 LAB — T4, FREE: Free T4: 0.66 ng/dL (ref 0.61–1.12)

## 2022-03-06 LAB — VITAMIN B12: Vitamin B-12: 490 pg/mL (ref 180–914)

## 2022-03-06 LAB — LACTIC ACID, PLASMA: Lactic Acid, Venous: 1.3 mmol/L (ref 0.5–1.9)

## 2022-03-06 MED ORDER — ACETAMINOPHEN 325 MG PO TABS: 650.0000 mg | ORAL_TABLET | Freq: Four times a day (QID) | ORAL | Status: DC | PRN

## 2022-03-06 MED ORDER — UMECLIDINIUM BROMIDE 62.5 MCG/ACT IN AEPB
1.0000 | INHALATION_SPRAY | Freq: Every day | RESPIRATORY_TRACT | Status: DC
  Administered 2022-03-10 – 2022-03-11 (×2): 1 via RESPIRATORY_TRACT
  Filled 2022-03-06: qty 7

## 2022-03-06 MED ORDER — IPRATROPIUM-ALBUTEROL 0.5-2.5 (3) MG/3ML IN SOLN: 3.0000 mL | RESPIRATORY_TRACT | Status: DC | PRN

## 2022-03-06 MED ORDER — VITAMIN D 25 MCG (1000 UNIT) PO TABS
1000.0000 [IU] | ORAL_TABLET | Freq: Every day | ORAL | Status: DC
  Administered 2022-03-06 – 2022-03-10 (×4): 1000 [IU] via ORAL
  Filled 2022-03-06 (×4): qty 1

## 2022-03-06 MED ORDER — THIAMINE HCL 100 MG PO TABS
100.0000 mg | ORAL_TABLET | Freq: Every day | ORAL | Status: DC
  Administered 2022-03-06: 100 mg via ORAL
  Filled 2022-03-06: qty 1

## 2022-03-06 MED ORDER — ACETAMINOPHEN 650 MG RE SUPP: 650.0000 mg | Freq: Four times a day (QID) | RECTAL | Status: DC | PRN

## 2022-03-06 MED ORDER — FAMOTIDINE 20 MG PO TABS: 20.0000 mg | ORAL_TABLET | Freq: Every day | ORAL | Status: DC | PRN

## 2022-03-06 MED ORDER — ALBUTEROL SULFATE (2.5 MG/3ML) 0.083% IN NEBU
2.5000 mg | INHALATION_SOLUTION | RESPIRATORY_TRACT | Status: DC | PRN
  Filled 2022-03-06: qty 3

## 2022-03-06 MED ORDER — LEVOTHYROXINE SODIUM 75 MCG PO TABS
75.0000 ug | ORAL_TABLET | Freq: Every day | ORAL | Status: DC
  Administered 2022-03-08 – 2022-03-14 (×6): 75 ug via ORAL
  Filled 2022-03-06 (×9): qty 1

## 2022-03-06 MED ORDER — CLONAZEPAM 0.5 MG PO TABS: 0.5000 mg | ORAL_TABLET | Freq: Three times a day (TID) | ORAL | Status: DC

## 2022-03-06 MED ORDER — IPRATROPIUM-ALBUTEROL 0.5-2.5 (3) MG/3ML IN SOLN
3.0000 mL | Freq: Three times a day (TID) | RESPIRATORY_TRACT | Status: DC
  Filled 2022-03-06: qty 3

## 2022-03-06 MED ORDER — CLONAZEPAM 0.125 MG PO TBDP
0.2500 mg | ORAL_TABLET | Freq: Three times a day (TID) | ORAL | Status: DC
Start: 2022-03-06 — End: 2022-03-15
  Administered 2022-03-06 – 2022-03-14 (×23): 0.25 mg via ORAL
  Filled 2022-03-06 (×23): qty 2

## 2022-03-06 MED ORDER — FERROUS SULFATE 325 (65 FE) MG PO TABS
325.0000 mg | ORAL_TABLET | Freq: Every day | ORAL | Status: DC
  Administered 2022-03-08 – 2022-03-14 (×7): 325 mg via ORAL
  Filled 2022-03-06 (×8): qty 1

## 2022-03-06 MED ORDER — ATENOLOL 25 MG PO TABS
25.0000 mg | ORAL_TABLET | Freq: Two times a day (BID) | ORAL | Status: DC
  Administered 2022-03-07 – 2022-03-10 (×6): 25 mg via ORAL
  Filled 2022-03-06 (×6): qty 1

## 2022-03-06 MED ORDER — MOMETASONE FURO-FORMOTEROL FUM 100-5 MCG/ACT IN AERO
2.0000 | INHALATION_SPRAY | Freq: Two times a day (BID) | RESPIRATORY_TRACT | Status: DC
  Administered 2022-03-07: 2 via RESPIRATORY_TRACT
  Filled 2022-03-06: qty 8.8

## 2022-03-06 NOTE — ED Notes (Signed)
Staff assisted pt to get onto bedside commode to urinate. Once pt  got up she refused to sit on the commode and tried to walk out of the room. Staff able to get pt to sit back in bed, she refused to lay back in the bed and wanted to stay sitting. Daughter at bedside states she can sit with her and will call for help in pt tried to get up and leave.  ? ?Daugther provided call bell and door and curtain remain open  ?

## 2022-03-06 NOTE — Assessment & Plan Note (Addendum)
Failure to thrive ?poor oral intake ?Underweight ?Placing the patient at high risk of poor outcome. ?Body mass index is 15.11 kg/m?[name] ?Nutrition Problem: Inadequate oral intake ?Etiology:  (hallucinations) ?Nutrition Interventions: ?Interventions: Ensure Enlive (each supplement provides 350kcal and 20 grams of protein)  ?

## 2022-03-06 NOTE — Assessment & Plan Note (Deleted)
--   Continue supplemental oxygen. BIPAP at night. Per pulmonology needs noninvasive ventilation at night. ?-- per Dr. Everardo All "Patient continues to exhibit signs of hypercapnia associated with chronic respiratory failures secondary to severe COPD. Patient requires the use of NIV both QHS and daytime to help with exacerbation periods. The use of the NIV will treat patient's high PCO2 levels and can reduce risk of exacerbations and future hospitalizations when used at nigh and during the day. Patient will need this advanced settings in conjunction with her current medication regimen; BiPAP is not an option due to its functional limitations and the severity of the patient's condition." ?

## 2022-03-06 NOTE — Assessment & Plan Note (Deleted)
--+  visual hallucinations, paranoia ?--management per psychiatry ?

## 2022-03-06 NOTE — Assessment & Plan Note (Addendum)
mild, stable, follow. ?

## 2022-03-06 NOTE — H&P (Addendum)
?History and Physical  ? ? ?Patient: Claire [name] QHU:[id number] DOB: 09-06-1951 ?DOA: 03/06/2022 ?DOS: the patient was seen and examined on 03/06/2022 ?PCP: Donella Stade, PA-C  ?Patient coming from: Home - lives with her daughter  ? ? ?Chief Complaint: AMS ? ?HPI: [name] is a 71 y.o. female with medical history significant of HTN, hypothyroidism, PTSD, anxiety, depression, COPD, chronic hypoxemic respiratory failure on 2-3L, cavitary MAC, anemia  who presents with AMS and shortness of breath. She is on home oxygen, but  EMS reports she was not on oxygen and her oxygen was 65% on room air. Family called EMS due to concerns about her altered mental status and shortness of breath. History from her daughter.  ? ?Recently admitted at [facility] on 3/23-4/1 for hyponatremia. Initially admitted to psychiatry on 02/22/22 for suicide attempt by clonazepam overdose. Also had new UTI with d/c home on cefdinir. WBC was 25.6 and thought secondary to UTI. Hyponatremia thought to be due to SIADH. She also had 2 falls in hospital and on one hit her head. Ct with no acute finding. She has not been taking pills at home and daughter states after her hospitalization at the beginning of February this year she has become increasingly confused. Admitted for acute on chronic anemia on 2/14 at [facility] with 1 unit of PRBC. Per this hospital stay patient made threatening statements to staff and psychiatry was consulted with concern for underlying personality disorder at this time.  ? ? ?Denies any fever/chills, vision changes/headaches, chest pain or palpitations, abdominal pain, N/V/D, dysuria or leg swelling.  ? ?She stopped smoking 50 days ago.  ? ?ER Course:  vitals: afebrile, bp: 153/91, HR: 78, RR: 23, oxygen: 99%RA ?Pertinent labs: wbc: 14.9, hgb: 8.4, chloride: 83, albumin 2.3,  ?CXR: opacities in left lung, increased in LLL and decreased in LUL, concerning for persistent multifocal infection or inflammatory process.  ?CTH:  hypodensity in posterior left putamen/external capsule appears new compared to 01/06/22, but age indeterminate.  ?In ED: pulm consulted. CT chest with contrast ordered.  ? ? ? ?Review of Systems: As mentioned in the history of present illness. All other systems reviewed and are negative. ?Past Medical History:  ?Diagnosis Date  ? Acid reflux   ? Anxiety   ? High blood pressure   ? High serum parathyroid hormone (PTH) 07/19/2017  ? 96 06/28/2014, 162 12/29/16  ? Hypothyroid   ? PTSD (post-traumatic stress disorder)   ? ?No past surgical history on file. ?Social History:  reports that she has quit smoking. Her smoking use included cigarettes. She has a 55.00 pack-year smoking history. She has never used smokeless tobacco. She reports that she does not drink alcohol and does not use drugs. ? ?Allergies  ?Allergen Reactions  ? Bactrim [Sulfamethoxazole-Trimethoprim]   ?  ACUTE RENAL fAILURE/HYPONATREMIA/HYPERKALEMIA  ? Macrodantin [Nitrofurantoin Macrocrystal] Rash  ? Ibuprofen Other (See Comments)  ?  unknown  ? Ivp Dye [Iodinated Contrast Media] Other (See Comments)  ?  unknown  ? Medrol [Methylprednisolone]   ?  Hallucinations  ? Trimethoprim   ?  Fatigue/weakness/no appetite  ? Zithromax [Azithromycin]   ?  diarrhea  ? ? ?Family History  ?Problem Relation Age of Onset  ? Anxiety disorder Mother   ? OCD Daughter   ? Alcohol abuse Father   ? Anxiety disorder Sister   ? ? ?Prior to Admission medications   ?Medication Sig Start Date End Date Taking? Authorizing Provider  ?albuterol (VENTOLIN HFA) 108 (90  Base) MCG/ACT inhaler INHALE 1-2 PUFFS INTO THE LUNGS EVERY 2 HOURS AS NEEDED FOR WHEEZING OR SHORTNESS OF BREATH. ?Patient taking differently: Inhale 1-2 puffs into the lungs every 2 (two) hours as needed for wheezing or shortness of breath. 01/04/22   Donella Stade, PA-C  ?AMBULATORY NON FORMULARY MEDICATION Continuous stationary and portable oxygen tanks for use with ambulation. DX: COPD with hypoxia. 07/16/18    Donella Stade, PA-C  ?AMBULATORY NON FORMULARY MEDICATION Medication Name: full face mask to use with oxygen 08/25/21   Donella Stade, PA-C  ?AMBULATORY NON FORMULARY MEDICATION Full face mask for night time O2 ?DX: J96.11 01/20/22   Breeback, Luvenia Starch L, PA-C  ?atenolol (TENORMIN) 25 MG tablet Take 1 tablet (25 mg total) by mouth 2 (two) times daily. ?Patient taking differently: Take 50 mg by mouth 2 (two) times daily. 11/19/21   Donella Stade, PA-C  ?Budesonide ER 9 MG TB24 Take 1 tablet by mouth daily. 02/04/22   [provider]  ?cetirizine (ZYRTEC) 10 MG chewable tablet Chew 10 mg by mouth daily. ?Patient not taking: Reported on 02/14/2022    [provider]  ?cholecalciferol (VITAMIN D) 25 MCG (1000 UT) tablet Take 1,000 Units by mouth daily.    [provider]  ?clonazePAM (KLONOPIN) 0.5 MG tablet Take 1 tablet (0.5 mg total) by mouth 3 (three) times daily. 02/07/22   Merian Capron, MD  ?dicyclomine (BENTYL) 10 MG capsule Take 10-20 mg by mouth in the morning and at bedtime.    [provider]  ?estradiol (ESTRACE) 0.1 MG/GM vaginal cream PLACE ONE APPLICATOR FULL THREE TIMES A WEEK AND ONE PEA SIZED AMOUNT APPLIED TO URETHRAL OPENING ?Patient taking differently: Place 1 Applicatorful vaginally 3 (three) times a week. 11/12/21   Breeback, Jade L, PA-C  ?famotidine (PEPCID) 20 MG tablet TAKE 1 TABLET BY MOUTH TWICE A DAY ?Patient taking differently: Take 20 mg by mouth daily as needed for heartburn or indigestion. 07/01/19   Breeback, Royetta Car, PA-C  ?ferrous sulfate 325 (65 FE) MG EC tablet 1 tab PO TID every other day ?Patient taking differently: Take 325 mg by mouth See admin instructions. 1 t TID every other day 07/13/17   Trixie Dredge, PA-C  ?ipratropium (ATROVENT) 0.06 % nasal spray Place 2 sprays into both nostrils 4 (four) times daily. ?Patient not taking: Reported on 02/14/2022 12/20/21   Iran Planas L, PA-C  ?ipratropium-albuterol (DUONEB) 0.5-2.5 (3)  MG/3ML SOLN Take 3 mLs by nebulization every 4 (four) hours as needed (wheeze, SOB). 01/10/22 02/09/22  Pokhrel, Corrie Mckusick, MD  ?Lactobacillus (FLORAJEN ACIDOPHILUS) CAPS Take 1 capsule by mouth daily.    [provider]  ?levothyroxine (SYNTHROID) 75 MCG tablet Take 1 tablet (75 mcg total) by mouth daily. 11/03/21   Breeback, Luvenia Starch L, PA-C  ?nystatin (MYCOSTATIN) 100000 UNIT/ML suspension Take 5 mLs (500,000 Units total) by mouth 4 (four) times daily. ?Patient not taking: Reported on 02/14/2022 01/17/22   Donella Stade, PA-C  ? ? ?Physical Exam: ?Vitals:  ? 03/06/22 1002 03/06/22 1115 03/06/22 1230 03/06/22 1245  ?BP: (!) 153/91 (!) 150/88 (!) 157/82 (!) 142/86  ?Pulse: 78 81 71 76  ?Resp: (!) 23 18 (!) 31 (!) 25  ?Temp: 98.6 ?F (37 ?C)     ?TempSrc: Oral     ?SpO2: 99% 97% 100% 100%  ? ?General:  Appears calm and comfortable and is in NAD. Cachetic appearing ?Eyes:  PERRL, EOMI, normal lids, iris ?ENT:  grossly normal hearing, lips &  tongue, mmm; poor dentition. ?Neck:  no LAD, masses or thyromegaly; no carotid bruits ?Cardiovascular:  RRR, no m/r/g. No LE edema.  ?Respiratory:   decreased breath sounds in upper and lower left lobe with occasional course sound no wheezing.   Normal respiratory effort. ?Abdomen:  soft, NT, ND, NABS ?Back:   normal alignment, no CVAT ?Skin:  no rash or induration seen on limited exam. Bruising over left lower leg.  ?Musculoskeletal:  grossly normal tone BUE/BLE, good ROM, no bony abnormality ?Lower extremity:  No LE edema.  Limited foot exam with no ulcerations.  2+ distal pulses. ?Psychiatric:  tangential and disoriented speech. Active visual hallucinations.  speech fluent and inappropriate at times. Speech  not pressured. Denies any SI/HI/AH. +VH.  ?Neurologic:  CN 2-12 grossly intact, moves all extremities in coordinated fashion, sensation intact ? ? ?Radiological Exams on Admission: ?Independently reviewed - see discussion in A/P where applicable ? ?CT Head Wo  Contrast ? ?Result Date: 03/06/2022 ?CLINICAL DATA:  Mental status change, unknown cause EXAM: CT HEAD WITHOUT CONTRAST TECHNIQUE: Contiguous axial images were obtained from the base of the skull through the vertex without intravenous con

## 2022-03-06 NOTE — Consult Note (Signed)
? ?NAME:  Claire [name], MRN:  [id number], DOB:  May 24, 1951, LOS: 0 ?ADMISSION DATE:  03/06/2022, CONSULTATION DATE:  03/06/22 ?REFERRING MD:  Ursula Beath, DO CHIEF COMPLAINT:  Altered mental status  ? ?History of Present Illness:  ?71 year old female with emphysema, chronic hypoxemic respiratory failure, cavitary MAC pulmonary infection, hx of TB exposure, HTN who presents to ED for altered mental status. EMS was called and found patient with oxygen reportedly in the 60s. In the ED, she is agitated, hallucinating and profoundly altered from baseline. Normally she is polite and appropriately engages in conversation in the office.  ? ?She was had recent hospitalizations including and 2/3-2/6 for COPD exacerbation and newly diagnosed cavitary lung lesions secondary to [place], 2/14 for anemia, 3/23-4/1 for hyponatremia, 3/21 for SI with clonazepam overdose. Of note, patient diagnosed with UTI but no culture. Treated with fosfomycin and discharged with cefdinir ? ?In between these hospitalizations, she was seen in the Pulmonary office by me (Dr. Loanne Drilling) with plans to see ID and potentially start treatment for MAC. GOC had been discussed with conflict between patient and daughter regarding starting treatment, leading to the hospitalization for SI prior to her ID outpatient visit. She has had noncompliance with treatment though unclear if underlying psych issue vs organic causes. After her recent discharge from [facility] on 4/1, patient has been threatening daughter and hallucinating. EMS was called and found patient hypoxemic on room air to the 60s. ? ?PCCM consulted for pulmonary recommendations. ? ? ?Pertinent  Medical History  ?emphysema, chronic hypoxemic respiratory failure, cavitary MAC pulmonary infection, hx of TB exposure, HTN, anxiety, depression, hypothyroidism ? ?Significant Hospital Events: ?Including procedures, antibiotic start and stop dates in addition to other pertinent events   ?4/2 Admit to [facility] ? ?Interim History  / Subjective:  ?As above ? ?Objective   ?Blood pressure (!) 142/86, pulse 76, temperature 98.6 ?F (37 ?C), temperature source Oral, resp. rate (!) 25, SpO2 100 %. ?   ?   ?No intake or output data in the 24 hours ending 03/06/22 1613 ?There were no vitals filed for this visit. ? ?Physical Exam: ?General: Chronically ill-appearing, agitated ?HENT: [place], AT ?Eyes: EOMI, no scleral icterus ?Respiratory: Patient refused ?Cardiovascular: Patient refused ?Extremities:-Edema,-tenderness ?Neuro: Awake, alert, confused, hallucinating, moving extremities x 4 ? ?Resolved Hospital Problem list   ? ? ?Assessment & Plan:  ? ?Acute metabolic encephalopathy ?Acute delirium ?--Work-up per primary team for possible infectious/metabolic etiology ?--Agree with Psychiatry consult ?--Recommend ABG when able ? ?Cavitary MAC ?--F/u repeat CT chest ?--Recommend inpatient ID consult prior to discharge ? ?Chronic hypoxemic and hypercarbic respiratory failure  ?--Will need to arrange NIV prior to discharge ?--Patient continues to exhibit signs of hypercapnia associated with chronic respiratory failures secondary to severe COPD. Patient requires the use of NIV both QHS and daytime to help with exacerbation periods. The use of the NIV will treat patient's high PCO2 levels and can reduce risk of exacerbations and future hospitalizations when used at nigh and during the day. Patient will need this advanced settings in conjunction with her current medication regimen; BiPAP is not an option due to its functional limitations and the severity of the patient's condition. ?--Discussed with TOC. Will need to continue to work with case management/social work for safe discharge ? ?COPD ?--CONTINUE Dulera 200-5 TWO puffs TWICE a day ?--CONTINUE Spiriva 2.5 mcg TWO puffs ONCE a day ?--CONTINUE Duoneb nebulizer THREE times a day ?--CONTINUE oxygen 2L with rest, activity and sleep ? ?Pulmonary will  follow-up CT and then peripherally follow as needed ? ?Best  Practice (right click and "Reselect all SmartList Selections" daily)  ?Per primary ?Labs   ?CBC: ?Recent Labs  ?Lab 03/06/22 ?1155  ?WBC 14.9*  ?NEUTROABS 12.6*  ?HGB 8.4*  ?HCT 27.8*  ?MCV 101.1*  ?PLT 300  ? ? ?Basic Metabolic Panel: ?Recent Labs  ?Lab 03/06/22 ?1155  ?NA 133*  ?K 3.5  ?CL 83*  ?CO2 40*  ?GLUCOSE 92  ?BUN 18  ?CREATININE 0.72  ?CALCIUM 9.1  ? ?GFR: ?CrCl cannot be calculated (Unknown ideal weight.). ?Recent Labs  ?Lab 03/06/22 ?1155  ?WBC 14.9*  ?LATICACIDVEN 1.3  ? ? ?Liver Function Tests: ?Recent Labs  ?Lab 03/06/22 ?1155  ?AST 18  ?ALT 12  ?ALKPHOS 62  ?BILITOT 0.5  ?PROT 8.6*  ?ALBUMIN 2.3*  ? ?Recent Labs  ?Lab 03/06/22 ?1155  ?LIPASE 28  ? ?No results for input(s): AMMONIA in the last 168 hours. ? ?ABG ?No results found for: PHART, PCO2ART, PO2ART, HCO3, TCO2, ACIDBASEDEF, O2SAT  ? ?Coagulation Profile: ?No results for input(s): INR, PROTIME in the last 168 hours. ? ?Cardiac Enzymes: ?No results for input(s): CKTOTAL, CKMB, CKMBINDEX, TROPONINI in the last 168 hours. ? ?HbA1C: ?No results found for: HGBA1C ? ?CBG: ?No results for input(s): GLUCAP in the last 168 hours. ? ?Review of Systems:   ?Unable to obtain due to AMS ? ?Past Medical History:  ?She,  has a past medical history of Acid reflux, Anxiety, High blood pressure, High serum parathyroid hormone (PTH) (07/19/2017), Hypothyroid, and PTSD (post-traumatic stress disorder).  ? ?Surgical History:  ?No past surgical history on file.  ? ?Social History:  ? reports that she has quit smoking. Her smoking use included cigarettes. She has a 55.00 pack-year smoking history. She has never used smokeless tobacco. She reports that she does not drink alcohol and does not use drugs.  ? ?Family History:  ?Her family history includes Alcohol abuse in her father; Anxiety disorder in her mother and sister; OCD in her daughter.  ? ?Allergies ?Allergies  ?Allergen Reactions  ? Bactrim [Sulfamethoxazole-Trimethoprim]   ?  ACUTE RENAL  fAILURE/HYPONATREMIA/HYPERKALEMIA  ? Macrodantin [Nitrofurantoin Macrocrystal] Rash  ? Medrol [Methylprednisolone]   ?  Hallucinations  ? Trimethoprim   ?  Fatigue/weakness/no appetite  ? Zithromax [Azithromycin]   ?  diarrhea  ?  ? ?Home Medications  ?Prior to Admission medications   ?Medication Sig Start Date End Date Taking? Authorizing Provider  ?albuterol (VENTOLIN HFA) 108 (90 Base) MCG/ACT inhaler INHALE 1-2 PUFFS INTO THE LUNGS EVERY 2 HOURS AS NEEDED FOR WHEEZING OR SHORTNESS OF BREATH. ?Patient taking differently: Inhale 1-2 puffs into the lungs every 2 (two) hours as needed for wheezing or shortness of breath. 01/04/22   Donella Stade, PA-C  ?AMBULATORY NON FORMULARY MEDICATION Continuous stationary and portable oxygen tanks for use with ambulation. DX: COPD with hypoxia. 07/16/18   Donella Stade, PA-C  ?AMBULATORY NON FORMULARY MEDICATION Medication Name: full face mask to use with oxygen 08/25/21   Donella Stade, PA-C  ?AMBULATORY NON FORMULARY MEDICATION Full face mask for night time O2 ?DX: J96.11 01/20/22   Breeback, Luvenia Starch L, PA-C  ?atenolol (TENORMIN) 25 MG tablet Take 1 tablet (25 mg total) by mouth 2 (two) times daily. ?Patient taking differently: Take 50 mg by mouth 2 (two) times daily. 11/19/21   Donella Stade, PA-C  ?Budesonide ER 9 MG TB24 Take 1 tablet by mouth daily. 02/04/22   [provider]  ?cetirizine (  ZYRTEC) 10 MG chewable tablet Chew 10 mg by mouth daily. ?Patient not taking: Reported on 02/14/2022    [provider]  ?cholecalciferol (VITAMIN D) 25 MCG (1000 UT) tablet Take 1,000 Units by mouth daily.    [provider]  ?clonazePAM (KLONOPIN) 0.5 MG tablet Take 1 tablet (0.5 mg total) by mouth 3 (three) times daily. 02/07/22   Merian Capron, MD  ?dicyclomine (BENTYL) 10 MG capsule Take 10-20 mg by mouth in the morning and at bedtime.    [provider]  ?estradiol (ESTRACE) 0.1 MG/GM vaginal cream PLACE ONE APPLICATOR FULL THREE TIMES A WEEK  AND ONE PEA SIZED AMOUNT APPLIED TO URETHRAL OPENING ?Patient taking differently: Place 1 Applicatorful vaginally 3 (three) times a week. 11/12/21   Breeback, Jade L, PA-C  ?famotidine (PEPCID) 20 MG tablet TAKE 1 TABLET BY MOUT

## 2022-03-06 NOTE — Assessment & Plan Note (Addendum)
Baseline hgb appears to be around 8-10, has seen hematology outpatient ?Stable, continue oral iron and follow  ?

## 2022-03-06 NOTE — ED Notes (Addendum)
RN and pt daughter worked with pt to take PO medication for extended period of time without success. Pt continues to refuse, distrust noted. Pt tearful, waxing and waning understanding of importance of taking medication. Manipulative behavior noted to avoid medication administration. RN to retry at a later time tonight. ?

## 2022-03-06 NOTE — ED Provider Notes (Signed)
?  Provider Note ?MRN:  [id number]  ?Arrival date & time: 03/07/22    ?ED Course and Medical Decision Making  ?Assumed care from BUTLER at shift change. ? ?See not from prior team for complete details, in brief:  ?71 yo female ?HX MAC, COPD ?Recent psych admission, was signed out yesterday to daughter ?Daughter cant take care of her ?Pulm to eval for MAC eval ?Pt refused chest CT ?Pt refusing abx at home ?Discussed with pulmonary, discussed with hospitalist. Pt encephalopathic - not currently at her baseline per her prior office visits. ? ? We will plan for admission given significant comorbidities further stabilization. ?HDS.  ? ?Admit to hospitalist with PCCM on consult.  ? ? ?Clinical Course as of 03/07/22 0100  ?Nancy Fetter Mar 06, 2022  ?1052 Chest x-ray interpreted by me as COPD scarring not significantly changed from prior chest x-ray 2/23 [MB]  ?1110 Patient's daughter is here now.  She said she was recently admitted for an overdose and metabolic abnormalities.  She had 2 falls while she was in the hospital.  She had her discharged yesterday because she thought she would do better at home but today she said the patient was not redirectable and was refusing her medicines and she was afraid she was going to fall so she she called the ambulance. [MB]  ?[place] Dr. Loanne Drilling pulmonary reached out to me.  She was helping the patient could stay because its been very difficult getting an outpatient work-up on her.  She is going to come down a consult on the patient. [MB]  ?  ?Clinical Course User Index ?[MB] Hayden Rasmussen, MD  ? ? ? ?Procedures ? ?Final Clinical Impressions(s) / ED Diagnoses  ? ?  ICD-10-CM   ?1. Acute encephalopathy  G93.40   ?  ?2. Mycobacterial infection  A31.9   ?  ?  ?ED Discharge Orders   ? ? None  ? ?  ?  ?Discharge Instructions   ?None ?  ? ? ? ?  ?Jeanell Sparrow, DO ?03/07/22 0101 ? ?

## 2022-03-06 NOTE — ED Notes (Signed)
Pt refusing to go to CT scan pt is paranoid that staff and family are trying hurt or poison her.  ?Pt also not allowing staff to place her on the monitor, pt does not appear in distress. Respirations are even and non-labored  ? ?

## 2022-03-06 NOTE — Assessment & Plan Note (Addendum)
Followed by pulmonology and ID outpatient.  ?Tx recommended outpatient, although at present pulmonary things with the patient would not be a candidate for initiation of the therapy. ?

## 2022-03-06 NOTE — Assessment & Plan Note (Addendum)
Presents with confusion.  Delirium most likely in the setting of sepsis and acute flareup of her chronic mood disorder. ?Delirium is actually improving in the hospital. ?Psychiatry is following Highly appreciate their assistance. ?Continue Zyprexa to help significantly with her paranoia. ?Treated with IV thiamine. ?

## 2022-03-06 NOTE — ED Notes (Signed)
Pt daughter notified RN that she has to leave and expressed concerns for patient being left alone and reported that pt suffered two falls yesterday when left unattended at [facility], pulled multiple lines, and removes oxygen causing rapid desat. RN will increase safety rounds, safety sitter order placed, and posey bed alarm to be obtained and placed.  ?

## 2022-03-06 NOTE — ED Triage Notes (Signed)
Pt arrived by EMS from home, family concerned for altered mental status and shortness of breath. Pt is normally on 3.4L [place] due to COPD but she kept removing her O2. EMS reports that on RA pt at 65%.  ? ?Pt was discharged yesterday from hospital for UTI and electrolyte imbalance  ? ? ?

## 2022-03-06 NOTE — Progress Notes (Signed)
Patient refusing ABG. Patient wanting to get up out of bed and walk around. RN at bedside. Sent MD message. ?

## 2022-03-06 NOTE — TOC Initial Note (Addendum)
Transition of Care (TOC) - Initial/Assessment Note  ? ? ?Patient Details  ?Name: Claire [name] ?MRN: [id number] ?Date of Birth: 08-22-1951 ? ?Transition of Care (TOC) CM/SW Contact:    ?Verdell Carmine, RN ?Phone Number: ?03/06/2022, 3:06 PM ? ?Clinical Narrative:                 ? ?71 yo patient with COPD on oxygen at home, recently dx MAC and cavitary leison, has not been totally worked up with yet in regards to these items, due to being in and out of different hospital stays. She presents today with AMS. She kept taking her oxygen off, saturations were low. ?She apparently does have a UTI, needing her first antibiotic dose this AM but did not get it. Urinalysis and culture are pending for this visit. In the pulmonary office, BiPap was discussed. Spoke with Dr Loanne Drilling, who is her pulmonologist. BiPap has not been started due to needing a sleep study,  she has been in hospital frequently and if often sick missing appointments. ( Missed with ID, etc.) so sleep study has not been performed. Dr. Loanne Drilling  will discuss with Dr Melina Copa.  ?PT consult placed for recommendations.  ?TOC ( CM and CSW ) will follow for needs recommendations and transitions.  ?  ? 1630 Bipap ordered through adapt.  ? ? ?Patient Goals and CMS Choice ?  ?  ?  ? ?Expected Discharge Plan and Services ?  ?In-house Referral: Clinical Social Work ?Discharge Planning Services: CM Consult ?Post Acute Care Choice: Durable Medical Equipment ?Living arrangements for the past 2 months: [place] ?                ?  ?  ?  ?  ?  ?  ?  ?  ?  ?  ? ?Prior Living Arrangements/Services ?Living arrangements for the past 2 months: [place] ?Lives with:: Adult Children ?Patient language and need for interpreter reviewed:: Yes ?       ?Need for Family Participation in Patient Care: Yes (Comment) ?Care giver support system in place?: Yes (comment) ?  ?Criminal Activity/Legal Involvement Pertinent to Current Situation/Hospitalization: No - Comment as  needed ? ?Activities of Daily Living ?  ?  ? ?Permission Sought/Granted ?  ?  ?   ?   ?   ?   ? ?Emotional Assessment ?  ?  ?  ?Orientation: : Fluctuating Orientation (Suspected and/or reported [place]) ?Alcohol / Substance Use: Not Applicable ?Psych Involvement: No (comment) ? ?Admission diagnosis:  AMS ?Patient Active Problem List  ? Diagnosis Date Noted  ? Mycobacterial disease, pulmonary ([place]) 02/10/2022  ? Cavitary lung disease 02/08/2022  ? Personality disorder ([place]) 01/20/2022  ? Thrush 01/19/2022  ? COPD (chronic obstructive pulmonary disease) ([place]) 01/07/2022  ? Hypomagnesemia 01/07/2022  ? Tobacco use 01/07/2022  ? Protein-calorie malnutrition, severe ([place]) 01/07/2022  ? Physical debility 01/07/2022  ? Cavitary pneumonia 01/06/2022  ? Community acquired pneumonia of left upper lobe of lung 01/03/2022  ? Severe malnutrition ([place]) 01/03/2022  ? AKI (acute kidney injury) ([place]) 01/03/2022  ? Weakness 12/31/2021  ? Current smoker 11/03/2021  ? Stress due to family tension 09/02/2021  ? Nasal congestion 08/06/2021  ? History of UTI 06/02/2021  ? Acute cystitis with hematuria 04/26/2021  ? Cloudy urine 04/23/2021  ? Stage 3b chronic kidney disease ([place]) 04/23/2021  ? Medication management 03/15/2021  ? History of Clostridioides difficile colitis 02/22/2021  ? Underweight 01/22/2021  ? Mild  protein-calorie malnutrition ([place]) 01/19/2021  ? Clostridium difficile colitis 09/11/2020  ? Lower extremity edema 07/29/2020  ? Constipation 07/13/2020  ? Body mass index (BMI) of 19 or less in adult 05/08/2020  ? Malnutrition of mild degree ([place]) 05/06/2020  ? Anxiety about health 03/23/2020  ? Pernicious anemia 03/23/2020  ? Macrocytic anemia 03/10/2020  ? Elevated ferritin 03/10/2020  ? Irritable bowel syndrome with diarrhea 03/10/2020  ? Unintended weight loss 02/19/2020  ? Chronic rhinitis 01/31/2020  ? Chronic respiratory failure with hypoxia ([place]) 01/31/2020  ? Vaccine counseling 12/24/2019  ? Menopausal vaginal  dryness 12/24/2019  ? History of hypokalemia 10/09/2019  ? Leg cramps 05/29/2019  ? No energy 05/29/2019  ? Recurrent UTI 05/07/2019  ? DOE (dyspnea on exertion) 05/09/2018  ? High serum parathyroid hormone (PTH) 07/19/2017  ? Normocytic anemia 07/19/2017  ? Low serum vitamin B12 05/14/2017  ? Vitamin D deficiency 05/14/2017  ? Depressed mood 05/14/2017  ? Anemia of chronic disease 05/11/2017  ? Stage 3a chronic kidney disease ([place]) 01/16/2017  ? Iron deficiency anemia 01/16/2017  ? Centrilobular emphysema ([place]) 12/20/2016  ? Hyponatremia 12/19/2016  ? Hyperkalemia 12/19/2016  ? Chronic use of benzodiazepine for therapeutic purpose 08/11/2015  ? Acquired hypothyroidism 11/24/2014  ? BP (high blood pressure) 07/24/2012  ? PTSD (post-traumatic stress disorder) 11/10/2011  ?  Class: Question of  ? Neurosis, anxiety, panic type 11/10/2011  ?  Class: Chronic  ? ?PCP:  Donella Stade, PA-C ?Pharmacy:   ?[facility] ?[address], [place] [address] ?Phone: [phone] Fax: [phone] ? ?[place], [address] ?[place] [address] ?Phone: [phone] Fax: [phone] ? ?[id number] [place] [place]?Phone: [phone]: [phone]? ? ? ? ?Social Determinants of Health (SDOH) Interventions ?  ? ?Readmission Risk Interventions ?   ? View : No data to display.  ?  ?  ?  ? ? ? ?

## 2022-03-06 NOTE — Assessment & Plan Note (Deleted)
UA appeared infected and treated early on during hospital stay at [facility] (3/16)  ?Given 3g fosfomycin at discharge and discharged on cefdinir (4/1). She has only taken one of these pills ?Will check UA and culture and treat if indicated  ?

## 2022-03-06 NOTE — Evaluation (Signed)
Physical Therapy Evaluation ?Patient Details ?Name: Claire [name] ?MRN: [id number] ?DOB: [birth year] ?Today's Date: 03/06/2022 ? ?History of Present Illness ? Pt is a 71 y.o. female who presented 03/06/22 with AMS and SOB. EMS reported she was not on O2 with SpO2 level of 65%.  Pt admitted with acute encephalopathy, awaiting brain MRI. CT of head 4/2: "hypodensity in the posterior left putamen/external capsule appears new compared to the 01/06/2022 exam but is technically age indeterminate". Per chart, she was recently admitted at [facility] on 3/23-4/1 for hyponatremia and UTI and admitted to psychiatry on 02/22/22 for suicide attempt by clonazepam overdose. She had 2 falls while in the hospital (assuming [facility]), CT of head was negative. PMH: HTN, hypothyroidism, PTSD, anxiety, depression, COPD, chronic hypoxemic respiratory failure on 2-3L, cavitary MAC, anemia ?  ?Clinical Impression ? Pt presents with condition above and deficits mentioned below, see PT Problem List. Pt was living with her daughter PTA. It is unclear of pt's PLOF and the daughter's home set-up as the pt was confused and paranoid during the session, believing the daughter got the medical staff "on her side". To avoid further agitating the pt or creating increased distrust, this PT avoided asking the daughter questions and the pt was a poor historian. Will need to obtain this information in a future session. Unfortunately, was unable to assess pt's functional mobility OOB this date as pt resisted. However, was able to assess her balance, cognition, and some of her functional strength as pt sat EOB and repositioned in bed. Pt with deficits in dynamic balance, gross strength, memory, awareness, and attention and appears to be at risk for subsequent falls. Pt was also agitated and aggressive towards her daughter. If pt's cognition improves and the pt's family can provide the level of care needed, she may be able to go home with HHPT. However, at this time the  daughter is unable to provide the level of care needed and thus pt may benefit from a short-term SNF stay to maximize her independence and safety with all functional mobility. Will continue to follow acutely.  ?   ? ?Recommendations for follow up therapy are one component of a multi-disciplinary discharge planning process, led by the attending physician.  Recommendations may be updated based on patient status, additional functional criteria and insurance authorization. ? ?Follow Up Recommendations Skilled nursing-short term rehab (<3 hours/day) (if family can manage pt then HHPT) ? ?  ?Assistance Recommended at Discharge Frequent or constant Supervision/Assistance  ?Patient can return home with the following ? A lot of help with walking and/or transfers;A lot of help with bathing/dressing/bathroom;Assistance with cooking/housework;Direct supervision/assist for medications management;Direct supervision/assist for financial management;Assist for transportation;Help with stairs or ramp for entrance ? ?  ?Equipment Recommendations Other (comment) (TBA)  ?Recommendations for Other Services ? OT consult  ?  ?Functional Status Assessment Patient has had a recent decline in their functional status and demonstrates the ability to make significant improvements in function in a reasonable and predictable amount of time.  ? ?  ?Precautions / Restrictions Precautions ?Precautions: Fall;Other (comment) ?Precaution Comments: agitated/aggressive intermittently ?Restrictions ?Weight Bearing Restrictions: No  ? ?  ? ?Mobility ? Bed Mobility ?Overal bed mobility: Needs Assistance ?  ?  ?  ?  ?  ?  ?General bed mobility comments: Pt sitting sideways on bed upon arrival, resistive to return to supine or get OOB. Pt repositioning on occasion, bringing leg further up on bed almost to go supine but then would return to sit EOB,  min guard for safety. ?  ? ?Transfers ?  ?  ?  ?  ?  ?  ?  ?  ?  ?General transfer comment: Pt declined ?   ? ?Ambulation/Gait ?  ?  ?  ?  ?  ?  ?  ?General Gait Details: pt declined ? ?Stairs ?  ?  ?  ?  ?  ? ?Wheelchair Mobility ?  ? ?Modified Rankin (Stroke Patients Only) ?  ? ?  ? ?Balance Overall balance assessment: Needs assistance ?Sitting-balance support: No upper extremity supported, Single extremity supported, Feet supported ?Sitting balance-Leahy Scale: Fair ?Sitting balance - Comments: Able to reach mod off BOS but with 1 UE support, otherwise able to sit statically without UE support, min guard for safety. ?  ?  ?  ?Standing balance comment: pt declined ?  ?  ?  ?  ?  ?  ?  ?  ?  ?  ?  ?   ? ? ? ?Pertinent Vitals/Pain Pain Assessment ?Pain Assessment: Faces ?Faces Pain Scale: No hurt ?Pain Intervention(s): Monitored during session  ? ? ?Home Living Family/patient expects to be discharged to:: Private residence ?Living Arrangements: Children ?  ?  ?  ?  ?  ?  ?  ?  ?Additional Comments: Was living with her daughter recently. Pt was paranoid in regards to her daughter and staff, therefore did not ask questions to avoid further agitating her or increasing her paranoia.  ?  ?Prior Function Prior Level of Function : Independent/Modified Independent (per chart 01/08/22) ?  ?  ?  ?  ?  ?  ?Mobility Comments: appears to have been needing increased assistance with AMS since her admission at [facility] 3/23-4/1 ?  ?  ? ? ?Hand Dominance  ?   ? ?  ?Extremity/Trunk Assessment  ? Upper Extremity Assessment ?Upper Extremity Assessment: Defer to OT evaluation ?  ? ?Lower Extremity Assessment ?Lower Extremity Assessment: Difficult to assess due to impaired cognition ?  ? ?Cervical / Trunk Assessment ?Cervical / Trunk Assessment: Kyphotic  ?Communication  ? Communication: No difficulties  ?Cognition Arousal/Alertness: Awake/alert ?Behavior During Therapy: Agitated, Anxious, Impulsive ?Overall Cognitive Status: Impaired/Different from baseline ?Area of Impairment: Orientation, Attention, Memory, Safety/judgement, Awareness ?  ?  ?   ?  ?  ?  ?  ?  ?Orientation Level: Disoriented to, Place, Situation (did not ask about time) ?Current Attention Level: Focused ?Memory: Decreased short-term memory ?  ?Safety/Judgement: Decreased awareness of deficits, Decreased awareness of safety ?Awareness: Intellectual ?  ?General Comments: Pt believing she was in jail several times despite re-orienting her. Pt agitated and agressive towards her daughter intermittently, with evident paranoia of daughter and staff, stating "oh she has got you on her side" and waving in a police man in the hallway and calling her son who also requested pt listen to daughter and staff. Pt resistive to cues provided and instead desired to just remain sitting EOB. Daughter agreed to watch pt and call for help if needed as pt has been impulsive to stand per staff, RN aware and ok with daughter watching pt. Poor awareness of deficits and safety, not seeming to know she was weak and at risk for falls with several recent falls per chart. Pt not recalling living with her daughter recently. ?  ?  ? ?  ?General Comments   ? ?  ?Exercises    ? ?Assessment/Plan  ?  ?PT Assessment Patient needs continued PT services  ?PT Problem  List Decreased strength;Decreased activity tolerance;Decreased balance;Decreased mobility;Decreased cognition;Decreased safety awareness;Cardiopulmonary status limiting activity ? ?   ?  ?PT Treatment Interventions DME instruction;Gait training;Stair training;Therapeutic activities;Functional mobility training;Therapeutic exercise;Balance training;Neuromuscular re-education;Cognitive remediation;Patient/family education   ? ?PT Goals (Current goals can be found in the Care Plan section)  ?Acute Rehab PT Goals ?Patient Stated Goal: to not be restricted ?PT Goal Formulation: With family ?Time For Goal Achievement: 03/20/22 ?Potential to Achieve Goals: Good ? ?  ?Frequency Min 3X/week ?  ? ? ?Co-evaluation   ?  ?  ?  ?  ? ? ?  ?AM-PAC PT "6 Clicks" Mobility  ?Outcome  Measure Help needed turning from your back to your side while in a flat bed without using bedrails?: A Little ?Help needed moving from lying on your back to sitting on the side of a flat bed without using bedrai

## 2022-03-06 NOTE — ED Provider Notes (Signed)
?[facility] ?Provider Note ? ? ?CSN: [id number] ?Arrival date & time: 03/06/22  0946 ? ?  ? ?History ? ?Chief Complaint  ?Patient presents with  ? Altered Mental Status  ? Shortness of Breath  ? ? ?Claire [name] is a 71 y.o. female.  She has a history of COPD MAC mental health illness.  Is on nasal cannula home oxygen.  Brought in from home for evaluation of not taking her medications, short of breath.  Was discharged yesterday after an admission for an overdose, metabolic derangement, uti.  Staying with her daughter.  Patient herself denies any complaints other than cough productive of some green sputum. ? ?The history is provided by the patient and the EMS personnel.  ?Cough ?Cough characteristics:  Productive ?Sputum characteristics:  Claire [name] ?Severity:  Moderate ?Onset quality:  Unable to specify ?Progression:  Unchanged ?Chronicity:  Recurrent ?Relieved by:  None tried ?Worsened by:  Nothing ?Ineffective treatments:  None tried ?Associated symptoms: shortness of breath   ?Associated symptoms: no chest pain, no fever, no headaches, no rhinorrhea and no sore throat   ? ?  ? ?Home Medications ?Prior to Admission medications   ?Medication Sig Start Date End Date Taking? Authorizing Provider  ?albuterol (VENTOLIN HFA) 108 (90 Base) MCG/ACT inhaler INHALE 1-2 PUFFS INTO THE LUNGS EVERY 2 HOURS AS NEEDED FOR WHEEZING OR SHORTNESS OF BREATH. ?Patient taking differently: Inhale 1-2 puffs into the lungs every 2 (two) hours as needed for wheezing or shortness of breath. 01/04/22   Donella Stade, PA-C  ?AMBULATORY NON FORMULARY MEDICATION Continuous stationary and portable oxygen tanks for use with ambulation. DX: COPD with hypoxia. 07/16/18   Donella Stade, PA-C  ?AMBULATORY NON FORMULARY MEDICATION Medication Name: full face mask to use with oxygen 08/25/21   Donella Stade, PA-C  ?AMBULATORY NON FORMULARY MEDICATION Full face mask for night time O2 ?DX: J96.11 01/20/22   Breeback,  Luvenia Starch L, PA-C  ?atenolol (TENORMIN) 25 MG tablet Take 1 tablet (25 mg total) by mouth 2 (two) times daily. ?Patient taking differently: Take 50 mg by mouth 2 (two) times daily. 11/19/21   Donella Stade, PA-C  ?Budesonide ER 9 MG TB24 Take 1 tablet by mouth daily. 02/04/22   [provider]  ?cetirizine (ZYRTEC) 10 MG chewable tablet Chew 10 mg by mouth daily. ?Patient not taking: Reported on 02/14/2022    [provider]  ?cholecalciferol (VITAMIN D) 25 MCG (1000 UT) tablet Take 1,000 Units by mouth daily.    [provider]  ?clonazePAM (KLONOPIN) 0.5 MG tablet Take 1 tablet (0.5 mg total) by mouth 3 (three) times daily. 02/07/22   Merian Capron, MD  ?dicyclomine (BENTYL) 10 MG capsule Take 10-20 mg by mouth in the morning and at bedtime.    [provider]  ?estradiol (ESTRACE) 0.1 MG/GM vaginal cream PLACE ONE APPLICATOR FULL THREE TIMES A WEEK AND ONE PEA SIZED AMOUNT APPLIED TO URETHRAL OPENING ?Patient taking differently: Place 1 Applicatorful vaginally 3 (three) times a week. 11/12/21   Breeback, Jade L, PA-C  ?famotidine (PEPCID) 20 MG tablet TAKE 1 TABLET BY MOUTH TWICE A DAY ?Patient taking differently: Take 20 mg by mouth daily as needed for heartburn or indigestion. 07/01/19   Breeback, Royetta Car, PA-C  ?ferrous sulfate 325 (65 FE) MG EC tablet 1 tab PO TID every other day ?Patient taking differently: Take 325 mg by mouth See admin instructions. 1 t TID every other day 07/13/17   Maisie Fus,  Elson Areas, PA-C  ?ipratropium (ATROVENT) 0.06 % nasal spray Place 2 sprays into both nostrils 4 (four) times daily. ?Patient not taking: Reported on 02/14/2022 12/20/21   Iran Planas L, PA-C  ?ipratropium-albuterol (DUONEB) 0.5-2.5 (3) MG/3ML SOLN Take 3 mLs by nebulization every 4 (four) hours as needed (wheeze, SOB). 01/10/22 02/09/22  Pokhrel, Corrie Mckusick, MD  ?Lactobacillus (FLORAJEN ACIDOPHILUS) CAPS Take 1 capsule by mouth daily.    [provider]  ?levothyroxine (SYNTHROID)  75 MCG tablet Take 1 tablet (75 mcg total) by mouth daily. 11/03/21   Breeback, Luvenia Starch L, PA-C  ?nystatin (MYCOSTATIN) 100000 UNIT/ML suspension Take 5 mLs (500,000 Units total) by mouth 4 (four) times daily. ?Patient not taking: Reported on 02/14/2022 01/17/22   Donella Stade, PA-C  ?   ? ?Allergies    ?Bactrim [sulfamethoxazole-trimethoprim], Macrodantin [nitrofurantoin macrocrystal], Medrol [methylprednisolone], Trimethoprim, and Zithromax [azithromycin]   ? ?Review of Systems   ?Review of Systems  ?Constitutional:  Negative for fever.  ?HENT:  Negative for rhinorrhea and sore throat.   ?Eyes:  Negative for visual disturbance.  ?Respiratory:  Positive for cough and shortness of breath.   ?Cardiovascular:  Negative for chest pain.  ?Gastrointestinal:  Negative for nausea and vomiting.  ?Genitourinary:  Negative for dysuria.  ?Neurological:  Negative for headaches.  ? ?Physical Exam ?Updated Vital Signs ?BP (!) 153/91 (BP Location: Right Arm)   Pulse 78   Temp 98.6 ?F (37 ?C) (Oral)   Resp (!) 23   SpO2 99%  ?Physical Exam ?Vitals and nursing note reviewed.  ?Constitutional:   ?   General: She is not in acute distress. ?   Appearance: She is underweight.  ?HENT:  ?   Head: Normocephalic and atraumatic.  ?Eyes:  ?   Conjunctiva/sclera: Conjunctivae normal.  ?Cardiovascular:  ?   Rate and Rhythm: Normal rate and regular rhythm.  ?   Heart sounds: No murmur heard. ?Pulmonary:  ?   Effort: Tachypnea and accessory muscle usage present. No respiratory distress.  ?   Breath sounds: Normal breath sounds.  ?Abdominal:  ?   Palpations: Abdomen is soft.  ?   Tenderness: There is no abdominal tenderness. There is no guarding or rebound.  ?Musculoskeletal:     ?   General: No swelling. Normal range of motion.  ?   Cervical back: Neck supple.  ?   Right lower leg: No edema.  ?   Left lower leg: No edema.  ?Skin: ?   General: Skin is warm and dry.  ?   Capillary Refill: Capillary refill takes less than 2 seconds.   ?Neurological:  ?   General: No focal deficit present.  ?   Mental Status: She is alert.  ?   Sensory: No sensory deficit.  ?   Motor: No weakness.  ? ? ?ED Results / Procedures / Treatments   ?Labs ?(all labs ordered are listed, but only abnormal results are displayed) ?Labs Reviewed  ?COMPREHENSIVE METABOLIC PANEL - Abnormal; Notable for the following components:  ?    Result Value  ? Sodium 133 (*)   ? Chloride 83 (*)   ? CO2 40 (*)   ? Total Protein 8.6 (*)   ? Albumin 2.3 (*)   ? All other components within normal limits  ?CBC WITH DIFFERENTIAL/PLATELET - Abnormal; Notable for the following components:  ? WBC 14.9 (*)   ? RBC 2.75 (*)   ? Hemoglobin 8.4 (*)   ? HCT 27.8 (*)   ? MCV  101.1 (*)   ? Neutro Abs 12.6 (*)   ? Abs Immature Granulocytes 0.11 (*)   ? All other components within normal limits  ?TSH - Abnormal; Notable for the following components:  ? TSH 10.141 (*)   ? All other components within normal limits  ?RESP PANEL BY RT-PCR (FLU A&B, COVID) [id number]  ?CULTURE, BLOOD (ROUTINE X 2)  ?CULTURE, BLOOD (ROUTINE X 2)  ?URINE CULTURE  ?LIPASE, BLOOD  ?LACTIC ACID, PLASMA  ?URINALYSIS, ROUTINE W REFLEX MICROSCOPIC  ?RAPID URINE DRUG SCREEN, HOSP PERFORMED  ?MAGNESIUM  ?AMMONIA  ?VITAMIN B1  ?BASIC METABOLIC PANEL  ?CBC  ?CORTISOL-AM, BLOOD  ?VITAMIN B12  ?T4, FREE  ?BLOOD GAS, VENOUS  ?TROPONIN I (HIGH SENSITIVITY)  ?TROPONIN I (HIGH SENSITIVITY)  ? ? ?EKG ?EKG Interpretation ? ?Date/Time:  Sunday March 06 2022 10:05:00 EDT ?Ventricular Rate:  76 ?PR Interval:  114 ?QRS Duration: 91 ?QT Interval:  394 ?QTC Calculation: 443 ?R Axis:   85 ?Text Interpretation: Sinus rhythm Borderline short PR interval Borderline right axis deviation Probable left ventricular hypertrophy Anterior Q waves, possibly due to LVH No significant change since prior 2/23 Confirmed by Aletta Edouard [phone]) on 03/06/2022 10:21:19 AM ? ?Radiology ?CT Head Wo Contrast ? ?Result Date: 03/06/2022 ?CLINICAL DATA:  Mental status change,  unknown cause EXAM: CT HEAD WITHOUT CONTRAST TECHNIQUE: Contiguous axial images were obtained from the base of the skull through the vertex without intravenous contrast. RADIATION DOSE REDUCTION: This exam was performed acco

## 2022-03-06 NOTE — Assessment & Plan Note (Addendum)
TSH high but free T4 WNL  ?Had not been taking medication, continue home dose for now  ?

## 2022-03-07 DIAGNOSIS — G9341 Metabolic encephalopathy: Secondary | ICD-10-CM | POA: Diagnosis not present

## 2022-03-07 DIAGNOSIS — A310 Pulmonary mycobacterial infection: Secondary | ICD-10-CM | POA: Diagnosis not present

## 2022-03-07 DIAGNOSIS — J431 Panlobular emphysema: Secondary | ICD-10-CM | POA: Diagnosis present

## 2022-03-07 DIAGNOSIS — I10 Essential (primary) hypertension: Secondary | ICD-10-CM | POA: Diagnosis present

## 2022-03-07 DIAGNOSIS — J90 Pleural effusion, not elsewhere classified: Secondary | ICD-10-CM | POA: Diagnosis not present

## 2022-03-07 DIAGNOSIS — Z7401 Bed confinement status: Secondary | ICD-10-CM | POA: Diagnosis not present

## 2022-03-07 DIAGNOSIS — Z87440 Personal history of urinary (tract) infections: Secondary | ICD-10-CM | POA: Diagnosis not present

## 2022-03-07 DIAGNOSIS — F22 Delusional disorders: Secondary | ICD-10-CM | POA: Diagnosis present

## 2022-03-07 DIAGNOSIS — J449 Chronic obstructive pulmonary disease, unspecified: Secondary | ICD-10-CM | POA: Diagnosis not present

## 2022-03-07 DIAGNOSIS — J9612 Chronic respiratory failure with hypercapnia: Secondary | ICD-10-CM | POA: Diagnosis not present

## 2022-03-07 DIAGNOSIS — Z7189 Other specified counseling: Secondary | ICD-10-CM | POA: Diagnosis not present

## 2022-03-07 DIAGNOSIS — R627 Adult failure to thrive: Secondary | ICD-10-CM | POA: Diagnosis present

## 2022-03-07 DIAGNOSIS — N179 Acute kidney failure, unspecified: Secondary | ICD-10-CM | POA: Diagnosis not present

## 2022-03-07 DIAGNOSIS — M47816 Spondylosis without myelopathy or radiculopathy, lumbar region: Secondary | ICD-10-CM | POA: Diagnosis not present

## 2022-03-07 DIAGNOSIS — Z720 Tobacco use: Secondary | ICD-10-CM | POA: Diagnosis not present

## 2022-03-07 DIAGNOSIS — G9340 Encephalopathy, unspecified: Secondary | ICD-10-CM | POA: Diagnosis not present

## 2022-03-07 DIAGNOSIS — R1030 Lower abdominal pain, unspecified: Secondary | ICD-10-CM | POA: Diagnosis not present

## 2022-03-07 DIAGNOSIS — Z66 Do not resuscitate: Secondary | ICD-10-CM | POA: Diagnosis not present

## 2022-03-07 DIAGNOSIS — Z20822 Contact with and (suspected) exposure to covid-19: Secondary | ICD-10-CM | POA: Diagnosis not present

## 2022-03-07 DIAGNOSIS — M439 Deforming dorsopathy, unspecified: Secondary | ICD-10-CM | POA: Diagnosis not present

## 2022-03-07 DIAGNOSIS — J9621 Acute and chronic respiratory failure with hypoxia: Secondary | ICD-10-CM

## 2022-03-07 DIAGNOSIS — J439 Emphysema, unspecified: Secondary | ICD-10-CM | POA: Diagnosis not present

## 2022-03-07 DIAGNOSIS — K521 Toxic gastroenteritis and colitis: Secondary | ICD-10-CM | POA: Diagnosis not present

## 2022-03-07 DIAGNOSIS — A319 Mycobacterial infection, unspecified: Secondary | ICD-10-CM | POA: Diagnosis not present

## 2022-03-07 DIAGNOSIS — R404 Transient alteration of awareness: Secondary | ICD-10-CM | POA: Diagnosis not present

## 2022-03-07 DIAGNOSIS — A419 Sepsis, unspecified organism: Secondary | ICD-10-CM | POA: Diagnosis not present

## 2022-03-07 DIAGNOSIS — Z515 Encounter for palliative care: Secondary | ICD-10-CM | POA: Diagnosis not present

## 2022-03-07 DIAGNOSIS — Z681 Body mass index (BMI) 19 or less, adult: Secondary | ICD-10-CM | POA: Diagnosis not present

## 2022-03-07 DIAGNOSIS — D638 Anemia in other chronic diseases classified elsewhere: Secondary | ICD-10-CM | POA: Diagnosis present

## 2022-03-07 DIAGNOSIS — I959 Hypotension, unspecified: Secondary | ICD-10-CM | POA: Diagnosis not present

## 2022-03-07 DIAGNOSIS — J9622 Acute and chronic respiratory failure with hypercapnia: Secondary | ICD-10-CM | POA: Diagnosis not present

## 2022-03-07 DIAGNOSIS — J9611 Chronic respiratory failure with hypoxia: Secondary | ICD-10-CM | POA: Diagnosis not present

## 2022-03-07 DIAGNOSIS — R64 Cachexia: Secondary | ICD-10-CM | POA: Diagnosis not present

## 2022-03-07 DIAGNOSIS — R410 Disorientation, unspecified: Secondary | ICD-10-CM | POA: Diagnosis not present

## 2022-03-07 DIAGNOSIS — F32A Depression, unspecified: Secondary | ICD-10-CM | POA: Diagnosis present

## 2022-03-07 DIAGNOSIS — E43 Unspecified severe protein-calorie malnutrition: Secondary | ICD-10-CM | POA: Diagnosis not present

## 2022-03-07 DIAGNOSIS — E039 Hypothyroidism, unspecified: Secondary | ICD-10-CM | POA: Diagnosis not present

## 2022-03-07 DIAGNOSIS — E871 Hypo-osmolality and hyponatremia: Secondary | ICD-10-CM | POA: Diagnosis present

## 2022-03-07 DIAGNOSIS — R140 Abdominal distension (gaseous): Secondary | ICD-10-CM | POA: Diagnosis not present

## 2022-03-07 DIAGNOSIS — Z28310 Unvaccinated for covid-19: Secondary | ICD-10-CM | POA: Diagnosis not present

## 2022-03-07 DIAGNOSIS — R0602 Shortness of breath: Secondary | ICD-10-CM | POA: Diagnosis present

## 2022-03-07 DIAGNOSIS — J189 Pneumonia, unspecified organism: Secondary | ICD-10-CM | POA: Diagnosis present

## 2022-03-07 DIAGNOSIS — R6889 Other general symptoms and signs: Secondary | ICD-10-CM | POA: Diagnosis not present

## 2022-03-07 LAB — BLOOD GAS, VENOUS
Acid-Base Excess: 25.1 mmol/L — ABNORMAL HIGH (ref 0.0–2.0)
Bicarbonate: 53.9 mmol/L — ABNORMAL HIGH (ref 20.0–28.0)
O2 Saturation: 91.2 %
Patient temperature: 36.6
pCO2, Ven: 73 mmHg (ref 44–60)
pH, Ven: 7.48 — ABNORMAL HIGH (ref 7.25–7.43)
pO2, Ven: 55 mmHg — ABNORMAL HIGH (ref 32–45)

## 2022-03-07 LAB — CBC
HCT: 26.3 % — ABNORMAL LOW (ref 36.0–46.0)
Hemoglobin: 8.1 g/dL — ABNORMAL LOW (ref 12.0–15.0)
MCH: 30.6 pg (ref 26.0–34.0)
MCHC: 30.8 g/dL (ref 30.0–36.0)
MCV: 99.2 fL (ref 80.0–100.0)
Platelets: 263 10*3/uL (ref 150–400)
RBC: 2.65 MIL/uL — ABNORMAL LOW (ref 3.87–5.11)
RDW: 13.4 % (ref 11.5–15.5)
WBC: 11.5 10*3/uL — ABNORMAL HIGH (ref 4.0–10.5)
nRBC: 0 % (ref 0.0–0.2)

## 2022-03-07 LAB — BASIC METABOLIC PANEL
Anion gap: 7 (ref 5–15)
BUN: 16 mg/dL (ref 8–23)
CO2: 42 mmol/L — ABNORMAL HIGH (ref 22–32)
Calcium: 9 mg/dL (ref 8.9–10.3)
Chloride: 84 mmol/L — ABNORMAL LOW (ref 98–111)
Creatinine, Ser: 0.75 mg/dL (ref 0.44–1.00)
GFR, Estimated: >60 mL/min (ref >60)
Glucose, Bld: 97 mg/dL (ref 70–99)
Potassium: 3.4 mmol/L — ABNORMAL LOW (ref 3.5–5.1)
Sodium: 133 mmol/L — ABNORMAL LOW (ref 135–145)

## 2022-03-07 LAB — CORTISOL-AM, BLOOD: Cortisol - AM: 20.5 ug/dL (ref 6.7–22.6)

## 2022-03-07 MED ORDER — PREDNISONE 20 MG PO TABS
50.0000 mg | ORAL_TABLET | Freq: Four times a day (QID) | ORAL | Status: DC
  Filled 2022-03-07: qty 1

## 2022-03-07 MED ORDER — DIPHENHYDRAMINE HCL 25 MG PO CAPS: 50.0000 mg | ORAL_CAPSULE | Freq: Once | ORAL | Status: AC

## 2022-03-07 MED ORDER — DIPHENHYDRAMINE HCL 25 MG PO CAPS
50.0000 mg | ORAL_CAPSULE | Freq: Once | ORAL | Status: DC
  Filled 2022-03-07: qty 2

## 2022-03-07 MED ORDER — THIAMINE HCL 100 MG PO TABS
100.0000 mg | ORAL_TABLET | Freq: Every day | ORAL | Status: DC
Start: 2022-03-10 — End: 2022-03-15
  Administered 2022-03-10 – 2022-03-14 (×5): 100 mg via ORAL
  Filled 2022-03-07 (×5): qty 1

## 2022-03-07 MED ORDER — HALOPERIDOL LACTATE 5 MG/ML IJ SOLN: 0.5000 mg | Freq: Three times a day (TID) | INTRAMUSCULAR | Status: DC | PRN

## 2022-03-07 MED ORDER — THIAMINE HCL 100 MG/ML IJ SOLN
200.0000 mg | Freq: Every day | INTRAVENOUS | Status: AC
  Administered 2022-03-07 – 2022-03-09 (×3): 200 mg via INTRAVENOUS
  Filled 2022-03-07 (×3): qty 2

## 2022-03-07 MED ORDER — DIPHENHYDRAMINE HCL 50 MG/ML IJ SOLN
50.0000 mg | Freq: Once | INTRAMUSCULAR | Status: AC
  Administered 2022-03-08: 50 mg via INTRAVENOUS
  Filled 2022-03-07: qty 1

## 2022-03-07 MED ORDER — IPRATROPIUM-ALBUTEROL 0.5-2.5 (3) MG/3ML IN SOLN
3.0000 mL | Freq: Three times a day (TID) | RESPIRATORY_TRACT | Status: DC
  Filled 2022-03-07 (×7): qty 3

## 2022-03-07 MED ORDER — PREDNISONE 20 MG PO TABS
50.0000 mg | ORAL_TABLET | Freq: Four times a day (QID) | ORAL | Status: AC
  Administered 2022-03-07 – 2022-03-08 (×3): 50 mg via ORAL
  Filled 2022-03-07 (×3): qty 1

## 2022-03-07 MED ORDER — ENSURE ENLIVE PO LIQD
237.0000 mL | Freq: Three times a day (TID) | ORAL | Status: DC
  Administered 2022-03-07 – 2022-03-14 (×13): 237 mL via ORAL

## 2022-03-07 MED ORDER — SODIUM CHLORIDE 3 % IN NEBU
4.0000 mL | INHALATION_SOLUTION | Freq: Three times a day (TID) | RESPIRATORY_TRACT | Status: DC
Start: 2022-03-07 — End: 2022-03-10
  Filled 2022-03-07 (×9): qty 4

## 2022-03-07 MED ORDER — HALOPERIDOL 0.5 MG PO TABS
0.5000 mg | ORAL_TABLET | Freq: Three times a day (TID) | ORAL | Status: DC | PRN
  Filled 2022-03-07: qty 1

## 2022-03-07 NOTE — Progress Notes (Signed)
Daughter asked patient if she would wear BIPAP as they had previously discussed and pt stated no not tonight.  ?

## 2022-03-07 NOTE — Progress Notes (Signed)
? ?  NAME:  Claire [name], MRN:  [id number], DOB:  January 05, 1951, LOS: 0 ?ADMISSION DATE:  03/06/2022, CONSULTATION DATE:  03/06/22 ?REFERRING MD:  Ursula Beath, DO CHIEF COMPLAINT:  Altered mental status  ? ?History of Present Illness:  ?71 year old female with emphysema, chronic hypoxemic respiratory failure, cavitary MAC pulmonary infection, hx of TB exposure, HTN who presents to ED for altered mental status. EMS was called and found patient with oxygen reportedly in the 60s. In the ED, she is agitated, hallucinating and profoundly altered from baseline. Normally she is polite and appropriately engages in conversation in the office.  ? ?She was had recent hospitalizations including and 2/3-2/6 for COPD exacerbation and newly diagnosed cavitary lung lesions secondary to [place], 2/14 for anemia, 3/23-4/1 for hyponatremia, 3/21 for SI with clonazepam overdose. Of note, patient diagnosed with UTI but no culture. Treated with fosfomycin and discharged with cefdinir ? ?In between these hospitalizations, she was seen in the Pulmonary office by me (Dr. Loanne Drilling) with plans to see ID and potentially start treatment for MAC. GOC had been discussed with conflict between patient and daughter regarding starting treatment, leading to the hospitalization for SI prior to her ID outpatient visit. She has had noncompliance with treatment though unclear if underlying psych issue vs organic causes. After her recent discharge from [facility] on 4/1, patient has been threatening daughter and hallucinating. EMS was called and found patient hypoxemic on room air to the 60s. ? ?PCCM consulted for pulmonary recommendations. ? ? ?Pertinent  Medical History  ?emphysema, chronic hypoxemic respiratory failure, cavitary MAC pulmonary infection, hx of TB exposure, HTN, anxiety, depression, hypothyroidism ? ?Significant Hospital Events: ?Including procedures, antibiotic start and stop dates in addition to other pertinent events   ?4/2 Admit to [facility] ? ?Interim History  / Subjective:  ?Still confused.  ? ?Objective   ?Blood pressure 137/72, pulse 73, temperature (Abnormal) 97.3 ?F (36.3 ?C), temperature source Axillary, resp. rate 18, height _0  (1.6 m), weight 38.7 kg, SpO2 99 %. ?   ?   ?No intake or output data in the 24 hours ending 03/07/22 1602 ?Filed Weights  ? 03/07/22 0026  ?Weight: 38.7 kg  ? ? ?Physical Exam: ?General this is a 71 year old female she remains confused  ?HENT temporal wasting MM moist  ?Pulm dec bases. No accessory use. Currently on 2.5 liters ?Card rrr ?Abd soft ?Ext warm  ?Neuro confused  ? ?Resolved Hospital Problem list   ? ? ?Assessment & Plan:  ? ?Acute metabolic encephalopathy ?Acute delirium ?Cavitary MAC ?Chronic hypoxemic and hypercarbic respiratory failure  ?COPD ? ?Discussion  ?Followed by Dr Loanne Drilling in out-pt setting see her extensive out-pt note.  ? ?Plan ?F/u pending CT imaging (ordered)  ?ID consult prior to dc ?Pulm hygiene  ?Supplemental oxygen  ?Delirium work up per IM service  ? ?Best Practice (right click and "Reselect all SmartList Selections" daily)  ?Per primary ? ? ? ?Erick Colace ACNP-BC ?[place] ?Pager # [phone] OR # [phone] if no answer ? ? ?

## 2022-03-07 NOTE — Progress Notes (Signed)
[facility] 6E20 [facility]) Hospital Liaison note: ? ?This patient is currently enrolled in [facility] outpatient-based Palliative Care. Will continue to follow for disposition. ? ?Please call with any outpatient palliative questions or concerns. ? ?Thank you, ?Abran Cantor, LPN ?[facility] Hospital Liaison ?[phone] ?

## 2022-03-07 NOTE — Progress Notes (Addendum)
Physical Therapy Treatment ?Patient Details ?Name: Claire [name] ?MRN: [id number] ?DOB: Sep 23, 1951 ?Today's Date: 03/07/2022 ? ? ?History of Present Illness 71 y.o. female adm 03/06/22 with AMS and SOB. Pt with acute encephalopathy, awaiting brain MRI. CT of head 4/2: "hypodensity in the posterior left putamen/external capsule appears new compared to the 01/06/2022 exam but is technically age indeterminate". Admitted to psychiatry on 02/22/22 for suicide attempt and [place] admission 3/23-4/1 with 2 falls. PMH: HTN, hypothyroidism, PTSD, anxiety, depression, COPD on 2-3L, anemia ? ?  ?PT Comments  ? ? Pt remains confused and stating that daughter is trying to harm her. Pt initially agitated and swatting but then able to state fear of falling, awake and sitting up without assist. Increased time to encourage and reassure pt with pt responding to therapist and tech for limited mobility. Pt would not progress OOB with fear of fall. Will continue to work toward increased mobility and function for D/C.  ? ?Pt on 4L with sats 90-98% ?   ?Recommendations for follow up therapy are one component of a multi-disciplinary discharge planning process, led by the attending physician.  Recommendations may be updated based on patient status, additional functional criteria and insurance authorization. ? ?Follow Up Recommendations ? Skilled nursing-short term rehab (<3 hours/day) ?  ?  ?Assistance Recommended at Discharge Frequent or constant Supervision/Assistance  ?Patient can return home with the following A lot of help with walking and/or transfers;A lot of help with bathing/dressing/bathroom;Assistance with cooking/housework;Direct supervision/assist for medications management;Direct supervision/assist for financial management;Assist for transportation;Help with stairs or ramp for entrance ?  ?Equipment Recommendations ? Other (comment) (TBD)  ?  ?Recommendations for Other Services   ? ? ?  ?Precautions / Restrictions  Precautions ?Precautions: Fall;Other (comment) ?Precaution Comments: intermittently agitated ?Restrictions ?Weight Bearing Restrictions: No  ?  ? ?Mobility ? Bed Mobility ?Overal bed mobility: Needs Assistance ?Bed Mobility: Supine to Sit ?  ?  ?Supine to sit: Min assist ?  ?  ?General bed mobility comments: pt able to transition to sitting with initial assist to initiate movement then pt willing to come to EOB to sit with tech. ?  ? ?Transfers ?Overall transfer level: Needs assistance ?  ?Transfers: Sit to/from Stand ?Sit to Stand: Min assist ?  ?  ?  ?  ?  ?General transfer comment: pt stood at EOB with bil HHA but would not pivot to chair or repeat trial to even step toward [facility] with education and reassurance with tech assist. Pt sat EOB grossly 12 min then denied getting to chair or returning to supine with MOd +2 assist to slide toward [facility] and elevate bed to pt ?  ? ?Ambulation/Gait ?  ?  ?  ?  ?  ?  ?  ?General Gait Details: pt would not attempt ? ? ?Stairs ?  ?  ?  ?  ?  ? ? ?Wheelchair Mobility ?  ? ?Modified Rankin (Stroke Patients Only) ?  ? ? ?  ?Balance   ?  ?Sitting balance-Leahy Scale: Good ?Sitting balance - Comments: EOB without physical assist ?  ?  ?Standing balance-Leahy Scale: Poor ?Standing balance comment: single UE support in standing ?  ?  ?  ?  ?  ?  ?  ?  ?  ?  ?  ?  ? ?  ?Cognition Arousal/Alertness: Awake/alert ?Behavior During Therapy: Restless, Agitated ?Overall Cognitive Status: Impaired/Different from baseline ?Area of Impairment: Orientation, Attention, Following commands, Safety/judgement, Problem solving ?  ?  ?  ?  ?  ?  ?  ?  ?  Orientation Level: Disoriented to, Time, Place, Situation ?Current Attention Level: Sustained ?Memory: Decreased short-term memory ?Following Commands: Follows one step commands inconsistently ?Safety/Judgement: Decreased awareness of deficits, Decreased awareness of safety ?Awareness: Intellectual ?Problem Solving: Slow processing ?General Comments: pt  stating she needs to call the police because daugther is trying to kill her (son on phone reports this is frequent). Pt initially with eyes closed and swatting or attempting to pinch when disturbed but then maintaining eyes open and smiling but stating fear of falling and being hurt. Very limited command following ?  ?  ? ?  ?Exercises   ? ?  ?General Comments General comments (skin integrity, edema, etc.): VSS on RA ?  ?  ? ?Pertinent Vitals/Pain Pain Assessment ?Faces Pain Scale: Hurts little more ?Pain Location: back ?Pain Intervention(s): Limited activity within patient's tolerance, Monitored during session, Repositioned  ? ? ?Home Living Family/patient expects to be discharged to:: Private residence ?Living Arrangements: Children ?Available Help at Discharge: Family;Available PRN/intermittently ?Type of Home: House ?Home Access: Stairs to enter ?  ?Entrance Stairs-Number of Steps: 3 ?  ?Home Layout: One level ?  ?Additional Comments: Was living with her daughter   ?Prior Function    ?  ?  ?   ? ?PT Goals (current goals can now be found in the care plan section) Progress towards PT goals: Progressing toward goals ? ?  ?Frequency ? ? ? Min 3X/week ? ? ? ?  ?PT Plan Current plan remains appropriate  ? ? ?Co-evaluation   ?  ?  ?  ?  ? ?  ?AM-PAC PT "6 Clicks" Mobility   ?Outcome Measure ? Help needed turning from your back to your side while in a flat bed without using bedrails?: A Little ?Help needed moving from lying on your back to sitting on the side of a flat bed without using bedrails?: A Lot ?Help needed moving to and from a bed to a chair (including a wheelchair)?: Total ?Help needed standing up from a chair using your arms (e.g., wheelchair or bedside chair)?: A Little ?Help needed to walk in hospital room?: Total ?Help needed climbing 3-5 steps with a railing? : Total ?6 Click Score: 11 ? ?  ?End of Session   ?Activity Tolerance: Treatment limited secondary to agitation ?Patient left: in bed;with call  bell/phone within reach;with bed alarm set ?Nurse Communication: Mobility status ?PT Visit Diagnosis: Muscle weakness (generalized) (M62.81);History of falling (Z91.81);Difficulty in walking, not elsewhere classified (R26.2) ?  ? ? ?Time: [id number] ?PT Time Calculation (min) (ACUTE ONLY): 26 min ? ?Charges:  $Therapeutic Activity: 23-37 mins          ?          ? ?Geraldine Sandberg P, PT ?Acute Rehabilitation Services ?Pager: [phone] ?Office: [phone] ? ? ? ?[place] B Jora Galluzzo ?03/07/2022, 10:36 AM ? ?

## 2022-03-07 NOTE — Progress Notes (Signed)
?PROGRESS NOTE ? ?Claire [name]  ?DOB: [birth year]  ?PCP: Donella Stade, PA-C ?GYI:[id number]  ?DOA: 03/06/2022 ? LOS: 0 days  ?Hospital Day: 2 ? ?Brief narrative: ?Claire [name] is a 71 y.o. female with PMH significant for HTN, hypothyroidism, hyperparathyroidism, PTSD, anxiety, depression, GERD, COPD on 2 to 3 L oxygen, cavitary MAC, chronic anemia. ?Patient presented to the ED on 03/06/2022 with altered mental status, shortness of breath.  ?At the time of EMS arrival, patient was not on her home oxygen and O2 sat was 65% on room air.  ? ?She had recent hospitalizations including and 2/3-2/6 for COPD exacerbation and newly diagnosed cavitary lung lesions secondary to [place], 2/14 for anemia, 3/21 for SI with clonazepam overdose, 3/23-4/1 for hyponatremia. Of note, patient diagnosed with UTI but no culture. Treated with fosfomycin and discharged with cefdinir ?In between these hospitalizations, patient was seen by pulmonology as an outpatient with plans to see ID and potentially start treatment for MAC. ?Per family, she has been hospitalized 3 times in the last 2 months and is progressively getting more confused and weak.  ?In the ED, patient was afebrile, hemodynamically stable ?Labs with WBC 14.9, hemoglobin 8.4 ?Chest x-ray showed left lung opacities concerning for persistent multifocal infection or inflammatory process.  ?CT head showed hypodensity in posterior left putamen/external capsule appears new compared to 01/06/22, but age indeterminate.  ?Admitted to hospitalist service  ?pulmonology was consulted. ? ?Subjective: ?Patient was seen and examined this morning.  Elderly Caucasian female.  Sitting up in bed.  Alert, awake, oriented to place only.  No family at bedside. ? ?Principal Problem: ?  Encephalopathy acute ?Active Problems: ?  COPD with chronic respiratory failure with hypoxia  ?  history of recent UTI  ?  Mycobacterial disease, pulmonary ([place]) ?  Protein-calorie malnutrition, severe ([place]) ?  Anemia of  chronic disease ?  Acquired hypothyroidism ?  Hyponatremia ?  Chronic respiratory failure with hypoxia (HCC) ?  Anxiety and depression/PTSD/recent SI attempt ?  ? ?Assessment and Plan: ?Acute metabolic encephalopathy ?-Per report, progressive decline in mental status with visual hallucinations, aggressive threatening behavior at home. ?-Recently admitted to psychiatry at [facility] for suicidal ideation.  Inpatient psychiatry consulted. ? ?Cavitary MAC ?-CT scan chest ordered this morning with premedication as he is allergic to contrast ?-ID consult to be called after the CT scan report. ? ?Chronic hypoxemic and hypercapnic respiratory failure  ?COPD  ?-continue dulera, spiriva, duoneb, SABA  ?-Has not been taking oral budesonide,  ?-bipap at night per pulmonology  ?-Stopped smoking >2 months ago ? ?history of recent UTI  ?UA appeared infected and treated early on during hospital stay at [facility] (3/16)  ?Given 3g fosfomycin at discharge and discharged on cefdinir (4/1). She has only taken one of these pills ?Will check UA and culture and treat if indicated  ? ?Mycobacterial disease, pulmonary ([place]) ?Followed by pulmonology outpatient ?Saw ID and was going to think on treatment  ?CT chest with contrast has been ordered here ?F/u on pulm recs ? ?Protein-calorie malnutrition, severe ([place]) ?Has not been eating for 6+ days with decline since February and overall FTT  ?Nutrition consulted ?Working up underlying AMS that could be contributing ? ?Anemia of chronic disease ?Baseline hgb appears to be around 8-10 ?Has seen hematology outpatient ?Stable, continue oral iron and follow  ? ?Acquired hypothyroidism ?TSH/free T4 pending  ?Has not been taking medication, continue home dose for now  ? ?Anxiety and depression/PTSD/recent SI attempt ?+visual hallucinations ?Will continue  her clonazepam (decreased dose of 0.9m TID). Psychiatry recently started wellbutrin at her recent hospitalization as well. Has not been taking. Will hold  for recommendations here and in setting of possible SIADH  ?psychiatry consult.  ? ?Hyponatremia ?-Recent studies done showing ?-Urine sodium: 103, urine osmolality: 595, plasma osmolality: 255 ?-TSH and AM cortisol ordered ?-salt tablets prescribed at d/c on 4/1, she has not been taking. ?-will f/u on cortisol/tsh and continue salt tablets per day team.  ?Recent Labs  ?Lab 03/06/22 ?1155 03/06/22 ?1920 03/07/22 ?0157  ?NA 133* 135 133*  ? ?Goals of care ?  Code Status: DNR .  It seems patient has been refusing meds and being aggressive to staff and family.  Palliative care consulted for goals of care. ? ? ?Mobility: Encourage ambulation.  PT eval ? ?Nutritional status:  ?Body mass index is 15.11 kg/m?.  ?  ?  ? ? ? ? ?Diet:  ?Diet Order   ? ?       ?  DIET SOFT Room service appropriate? Yes; Fluid consistency: Thin  Diet effective now       ?  ? ?  ?  ? ?  ? ? ?DVT prophylaxis:  ?SCDs Start: 03/06/22 1645 ?  ?Antimicrobials: None ?Fluid: None ?Consultants: PCCM ?Family Communication: None at bedside ? ?Status is: Inpatient  ? ?Continue in-hospital care because: Ongoing medical management ?Level of care: Progressive  ? ?Dispo: The patient is from: Home ?             Anticipated d/c is to: Pending clinical course ?             Patient currently is not medically stable to d/c. ?  Difficult to place patient No ? ? ? ? ?Infusions:  ? thiamine injection 200 mg (03/07/22 1608)  ? ? ?Scheduled Meds: ? atenolol  25 mg Oral BID  ? cholecalciferol  1,000 Units Oral Daily  ? clonazepam  0.25 mg Oral TID  ? diphenhydrAMINE  50 mg Oral Once  ? Or  ? diphenhydrAMINE  50 mg Intravenous Once  ? ferrous sulfate  325 mg Oral Q breakfast  ? ipratropium-albuterol  3 mL Nebulization TID  ? levothyroxine  75 mcg Oral Daily  ? mometasone-formoterol  2 puff Inhalation BID  ? predniSONE  50 mg Oral Q6H  ? [START ON 03/10/2022] thiamine  100 mg Oral Daily  ? umeclidinium bromide  1 puff Inhalation Daily  ? ? ?PRN meds: ?acetaminophen **OR**  acetaminophen, albuterol, famotidine  ? ?Antimicrobials: ?Anti-infectives (From admission, onward)  ? ? None  ? ?  ? ? ?Objective: ?Vitals:  ? 03/07/22 1300 03/07/22 1618  ?BP:  (!) 151/80  ?Pulse:  87  ?Resp: 18 16  ?Temp:  98 ?F (36.7 ?C)  ?SpO2:  97%  ? ?No intake or output data in the 24 hours ending 03/07/22 1632 ?Filed Weights  ? 03/07/22 0026  ?Weight: 38.7 kg  ? ?Weight change:  ?Body mass index is 15.11 kg/m?.  ? ?Physical Exam: ?General exam: Elderly Caucasian female.  Not in physical distress, ?Skin: No rashes, lesions or ulcers. ?HEENT: Atraumatic, normocephalic, no obvious bleeding ?Lungs: Diminished air entry in both bases ?CVS: Regular rate and rhythm, no murmur ?GI/Abd soft, nontender, nondistended, bowel sound present ?CNS: Alert, awake, oriented to place.  Minimal conversation ?Psychiatry: Sad affect ?Extremities: No pedal edema, no calf redness ? ?Data Review: I have personally reviewed the laboratory data and studies available. ? ?F/u labs ordered ?Unresulted Labs (From  admission, onward)  ? ?  Start     Ordered  ? 03/08/22 0500  CBC with Differential/Platelet  Tomorrow morning,   R       ? 03/07/22 1632  ? 03/08/22 2158  Basic metabolic panel  Tomorrow morning,   R       ? 03/07/22 1632  ? 03/06/22 2000  Vitamin B1  Once,   R       ? 03/06/22 2000  ? 03/06/22 1608  Rapid urine drug screen (hospital performed)  ONCE - STAT,   STAT       ? 03/06/22 1607  ? 03/06/22 1602  Urine Culture  Once,   R       ?Question:  Indication  Answer:  Altered mental status (if no other cause identified)  ? 03/06/22 1601  ? 03/06/22 1007  Urinalysis, Routine w reflex microscopic  Once,   STAT       ? 03/06/22 1007  ? ?  ?  ? ?  ? ? ?Signed, ?Terrilee Croak, MD ?[facility] ?03/07/2022 ? ? ? ? ? ? ? ? ? ? ? ? ?

## 2022-03-07 NOTE — TOC Initial Note (Addendum)
Transition of Care (TOC) - Initial/Assessment Note  ? ? ?Patient Details  ?Name: Claire [name] ?MRN: [id number] ?Date of Birth: May 12, 1951 ? ?Transition of Care (TOC) CM/SW Contact:    ?Milas Gain, LCSWA ?Phone Number: ?03/07/2022, 2:44 PM ? ?Clinical Narrative:                 ? ?CSW received consult for possible SNF placement at time of discharge. Due to patients current orientation CSW spoke with patients daughter [name] regarding PT recommendation of SNF placement at time of discharge. Patients daughter reports patient comes from home with her. Patients daughter expressed understanding of PT recommendation and is agreeable to SNF placement at time of discharge. Patients daughter gave CSW permission to fax initial referral to [facility] [place],and [facility]. CSW discussed insurance authorization process with patients daughter and will provide medicare compare SNF ratings list with accepted SNF bed offers when available.Patients daughter reports patients has not received the COVID vaccines.  No further questions reported at this time. CSW to continue to follow and assist with discharge planning needs.  ?Expected Discharge Plan: [place] ?Barriers to Discharge: Continued Medical Work up ? ? ?Patient Goals and CMS Choice ?  ?CMS Medicare.gov Compare Post Acute Care list provided to:: Patient Represenative (must comment) (Patients daughter [name]) ?  ? ?Expected Discharge Plan and Services ?Expected Discharge Plan: [place] ?In-house Referral: Clinical Social Work ?Discharge Planning Services: CM Consult ?Post Acute Care Choice: Durable Medical Equipment ?Living arrangements for the past 2 months: [place] ?                ?DME Arranged: [facility] ?DME Agency: [employer] ?Date DME Agency Contacted: 03/06/22 ?Time DME Agency Contacted: [phone] ?Representative spoke with at DME Agency: Delana Meyer ?  ?  ?  ?  ?  ? ?Prior Living Arrangements/Services ?Living  arrangements for the past 2 months: [place] ?Lives with:: Adult Children (Lives with Daughter) ?Patient language and need for interpreter reviewed:: Yes ?Do you feel safe going back to the place where you live?: No   SNF  ?Need for Family Participation in Patient Care: Yes (Comment) ?Care giver support system in place?: Yes (comment) ?  ?Criminal Activity/Legal Involvement Pertinent to Current Situation/Hospitalization: No - Comment as needed ? ?Activities of Daily Living ?  ?  ? ?Permission Sought/Granted ?Permission sought to share information with : Case Manager, Customer service manager, Family Supports ?Permission granted to share information with : No ? Share Information with NAME: Patient currently oriented to self spoke with patients daughter [name] ? Permission granted to share info w AGENCY: Patient currently oriented to self spoke with patients daughter [name] ? Permission granted to share info w Relationship: Patient currently oriented to self spoke with patients daughter [name] ? Permission granted to share info w Contact Information: Patient currently oriented to self spoke with patients daughter [name] [phone] ? ?Emotional Assessment ?  ?  ?  ?Orientation: : Oriented to Self ?Alcohol / Substance Use: Not Applicable ?Psych Involvement: No (comment) ? ?Admission diagnosis:  Mycobacterial infection [A31.9] ?Encephalopathy acute [G93.40] ?Acute encephalopathy [G93.40] ?Patient Active Problem List  ? Diagnosis Date Noted  ? Encephalopathy acute 03/06/2022  ? history of recent UTI  03/06/2022  ? Anxiety and depression/PTSD/recent SI attempt 03/06/2022  ? Mycobacterial disease, pulmonary ([place]) 02/10/2022  ? Cavitary lung disease 02/08/2022  ? Personality disorder ([place]) 01/20/2022  ? COPD with chronic respiratory failure with hypoxia  01/07/2022  ? Hypomagnesemia 01/07/2022  ? Tobacco  use 01/07/2022  ? Protein-calorie malnutrition, severe ([place]) 01/07/2022  ? Physical debility 01/07/2022  ?  Cavitary pneumonia 01/06/2022  ? Community acquired pneumonia of left upper lobe of lung 01/03/2022  ? Severe malnutrition ([place]) 01/03/2022  ? AKI (acute kidney injury) ([place]) 01/03/2022  ? Weakness 12/31/2021  ? Current smoker 11/03/2021  ? Stress due to family tension 09/02/2021  ? Nasal congestion 08/06/2021  ? History of UTI 06/02/2021  ? Acute cystitis with hematuria 04/26/2021  ? Cloudy urine 04/23/2021  ? Medication management 03/15/2021  ? History of Clostridioides difficile colitis 02/22/2021  ? Mild protein-calorie malnutrition ([place]) 01/19/2021  ? Clostridium difficile colitis 09/11/2020  ? Lower extremity edema 07/29/2020  ? Constipation 07/13/2020  ? Body mass index (BMI) of 19 or less in adult 05/08/2020  ? Malnutrition of mild degree ([place]) 05/06/2020  ? Anxiety about health 03/23/2020  ? Pernicious anemia 03/23/2020  ? Macrocytic anemia 03/10/2020  ? Elevated ferritin 03/10/2020  ? Irritable bowel syndrome with diarrhea 03/10/2020  ? Chronic rhinitis 01/31/2020  ? Chronic respiratory failure with hypoxia ([place]) 01/31/2020  ? Menopausal vaginal dryness 12/24/2019  ? History of hypokalemia 10/09/2019  ? Leg cramps 05/29/2019  ? Recurrent UTI 05/07/2019  ? DOE (dyspnea on exertion) 05/09/2018  ? High serum parathyroid hormone (PTH) 07/19/2017  ? Normocytic anemia 07/19/2017  ? Low serum vitamin B12 05/14/2017  ? Vitamin D deficiency 05/14/2017  ? Depressed mood 05/14/2017  ? Anemia of chronic disease 05/11/2017  ? Stage 3a chronic kidney disease ([place]) 01/16/2017  ? Iron deficiency anemia 01/16/2017  ? Centrilobular emphysema ([place]) 12/20/2016  ? Hyponatremia 12/19/2016  ? Hyperkalemia 12/19/2016  ? Chronic use of benzodiazepine for therapeutic purpose 08/11/2015  ? Acquired hypothyroidism 11/24/2014  ? BP (high blood pressure) 07/24/2012  ? PTSD (post-traumatic stress disorder) 11/10/2011  ?  Class: Question of  ? Neurosis, anxiety, panic type 11/10/2011  ?  Class: Chronic  ? ?PCP:  Donella Stade,  PA-C ?Pharmacy:   ?[place], [address] ?[place] [address] ?Phone: [phone] Fax: [phone] ? ? ? ? ?Social Determinants of Health (SDOH) Interventions ?  ? ?Readmission Risk Interventions ?   ? View : No data to display.  ?  ?  ?  ? ? ? ?

## 2022-03-07 NOTE — NC FL2 (Signed)
?[place] MEDICAID FL2 LEVEL OF CARE SCREENING TOOL  ?  ? ?IDENTIFICATION  ?Patient Name: ?Claire [name] Birthdate: 17-May-1951 Sex: female Admission Date (Current Location): ?03/06/2022  ?[place] and [place] Number: ? [place] ?  Facility and Address:  ?[facility]. [facility], [place] [address] [place], [place] [address] ?     Provider Number: ?[id number]  ?Attending Physician Name and Address:  ?Terrilee Croak, MD ? Relative Name and Phone Number:  ?[name] [phone] ?   ?Current Level of Care: ?Hospital Recommended Level of Care: ?[place] Prior Approval Number: ?  ? ?Date Approved/Denied: ?  PASRR Number: ?[id number] ? ?Discharge Plan: ?SNF ?  ? ?Current Diagnoses: ?Patient Active Problem List  ? Diagnosis Date Noted  ? Encephalopathy acute 03/06/2022  ? history of recent UTI  03/06/2022  ? Anxiety and depression/PTSD/recent SI attempt 03/06/2022  ? Mycobacterial disease, pulmonary ([place]) 02/10/2022  ? Cavitary lung disease 02/08/2022  ? Personality disorder ([place]) 01/20/2022  ? COPD with chronic respiratory failure with hypoxia  01/07/2022  ? Hypomagnesemia 01/07/2022  ? Tobacco use 01/07/2022  ? Protein-calorie malnutrition, severe ([place]) 01/07/2022  ? Physical debility 01/07/2022  ? Cavitary pneumonia 01/06/2022  ? Community acquired pneumonia of left upper lobe of lung 01/03/2022  ? Severe malnutrition ([place]) 01/03/2022  ? AKI (acute kidney injury) ([place]) 01/03/2022  ? Weakness 12/31/2021  ? Current smoker 11/03/2021  ? Stress due to family tension 09/02/2021  ? Nasal congestion 08/06/2021  ? History of UTI 06/02/2021  ? Acute cystitis with hematuria 04/26/2021  ? Cloudy urine 04/23/2021  ? Medication management 03/15/2021  ? History of Clostridioides difficile colitis 02/22/2021  ? Mild protein-calorie malnutrition ([place]) 01/19/2021  ? Clostridium difficile colitis 09/11/2020  ? Lower extremity edema 07/29/2020  ? Constipation 07/13/2020  ? Body mass index (BMI) of 19 or less in adult  05/08/2020  ? Malnutrition of mild degree ([place]) 05/06/2020  ? Anxiety about health 03/23/2020  ? Pernicious anemia 03/23/2020  ? Macrocytic anemia 03/10/2020  ? Elevated ferritin 03/10/2020  ? Irritable bowel syndrome with diarrhea 03/10/2020  ? Chronic rhinitis 01/31/2020  ? Chronic respiratory failure with hypoxia ([place]) 01/31/2020  ? Menopausal vaginal dryness 12/24/2019  ? History of hypokalemia 10/09/2019  ? Leg cramps 05/29/2019  ? Recurrent UTI 05/07/2019  ? DOE (dyspnea on exertion) 05/09/2018  ? High serum parathyroid hormone (PTH) 07/19/2017  ? Normocytic anemia 07/19/2017  ? Low serum vitamin B12 05/14/2017  ? Vitamin D deficiency 05/14/2017  ? Depressed mood 05/14/2017  ? Anemia of chronic disease 05/11/2017  ? Stage 3a chronic kidney disease ([place]) 01/16/2017  ? Iron deficiency anemia 01/16/2017  ? Centrilobular emphysema ([place]) 12/20/2016  ? Hyponatremia 12/19/2016  ? Hyperkalemia 12/19/2016  ? Chronic use of benzodiazepine for therapeutic purpose 08/11/2015  ? Acquired hypothyroidism 11/24/2014  ? BP (high blood pressure) 07/24/2012  ? PTSD (post-traumatic stress disorder) 11/10/2011  ? Neurosis, anxiety, panic type 11/10/2011  ? ? ?Orientation RESPIRATION BLADDER Height & Weight   ?  ?Self, Situation ? O2 (Nasal Cannula 2.5 liters) Incontinent Weight: 85 lb 5.1 oz (38.7 kg) ?Height:  5\' 3"  (160 cm)  ?BEHAVIORAL SYMPTOMS/MOOD NEUROLOGICAL BOWEL NUTRITION STATUS  ?    Incontinent Diet (Please see discharge summary)  ?AMBULATORY STATUS COMMUNICATION OF NEEDS Skin   ?Limited Assist Verbally Other (Comment) (Appropriate for ethnicity, pale,dry,ecchymosis,arm,right,left,scratch marks,arm,hand,right,left,non-tenting) ?  ?  ?  ?    ?     ?     ? ? ?Personal Care  Assistance Level of Assistance  ?Bathing, Feeding, Dressing Bathing Assistance: Maximum assistance ?Feeding assistance: Independent ?Dressing Assistance: Maximum assistance ?   ? ?Functional Limitations Info  ?Sight, Hearing, Speech Sight Info:   (WDL) ?Hearing Info: Adequate (WDL) ?Speech Info: Adequate  ? ? ?SPECIAL CARE FACTORS FREQUENCY  ?PT (By licensed PT), OT (By licensed OT)   ?  ?PT Frequency: 5x min weekly ?OT Frequency: 5x min weekly ?  ?  ?  ?   ? ? ?Contractures Contractures Info: Not present  ? ? ?Additional Factors Info  ?Code Status, Allergies, Psychotropic Code Status Info: DNR ?Allergies Info: Bactrim (sulfamethoxazole-trimethoprim),Macrodantin (nitrofurantoin Macrocrystal),Ibuprofen,Ivp Dye (iodinated Contrast Media),Medrol (methylprednisolone),Trimethoprim,Zithromax (azithromycin) ?Psychotropic Info: clonazePAM (KLONOPIN) disintegrating tablet 0.25 mg 3 times daily ?  ?  ?   ? ?Current Medications (03/07/2022):  This is the current hospital active medication list ?Current Facility-Administered Medications  ?Medication Dose Route Frequency Provider Last Rate Last Admin  ? acetaminophen (TYLENOL) tablet 650 mg  650 mg Oral Q6H PRN Orma Flaming, MD      ? Or  ? acetaminophen (TYLENOL) suppository 650 mg  650 mg Rectal Q6H PRN Orma Flaming, MD      ? albuterol (PROVENTIL) (2.5 MG/3ML) 0.083% nebulizer solution 2.5 mg  2.5 mg Inhalation Q2H PRN Orma Flaming, MD      ? atenolol (TENORMIN) tablet 25 mg  25 mg Oral BID Orma Flaming, MD      ? cholecalciferol (VITAMIN D3) tablet 1,000 Units  1,000 Units Oral Daily Orma Flaming, MD   1,000 Units at 03/06/22 1950  ? clonazePAM (KLONOPIN) disintegrating tablet 0.25 mg  0.25 mg Oral TID Orma Flaming, MD   0.25 mg at 03/06/22 1950  ? diphenhydrAMINE (BENADRYL) capsule 50 mg  50 mg Oral Once Terrilee Croak, MD      ? famotidine (PEPCID) tablet 20 mg  20 mg Oral Daily PRN Orma Flaming, MD      ? ferrous sulfate tablet 325 mg  325 mg Oral Q breakfast Orma Flaming, MD      ? ipratropium-albuterol (DUONEB) 0.5-2.5 (3) MG/3ML nebulizer solution 3 mL  3 mL Nebulization TID Orma Flaming, MD      ? levothyroxine (SYNTHROID) tablet 75 mcg  75 mcg Oral Daily Orma Flaming, MD      ?  mometasone-formoterol [facility]) 100-5 MCG/ACT inhaler 2 puff  2 puff Inhalation BID Orma Flaming, MD      ? predniSONE (DELTASONE) tablet 50 mg  50 mg Oral Q6H Dahal, Binaya, MD      ? thiamine (B-1) 200 mg in sodium chloride 0.9 % 50 mL IVPB  200 mg Intravenous Daily Cinderella, Margaret A      ? Followed by  ? [START ON 03/10/2022] thiamine tablet 100 mg  100 mg Oral Daily Cinderella, Margaret A      ? umeclidinium bromide (INCRUSE ELLIPTA) 62.5 MCG/ACT 1 puff  1 puff Inhalation Daily Orma Flaming, MD      ? ? ? ?Discharge Medications: ?Please see discharge summary for a list of discharge medications. ? ?Relevant Imaging Results: ? ?Relevant Lab Results: ? ? ?Additional Information ?[phone], Not covid vaccinated ? ?Milas Gain, LCSWA ? ? ? ? ?

## 2022-03-07 NOTE — Consult Note (Signed)
[name] New Face-to-Face Psychiatric Evaluation ? ? ?Service Date: March 07, 2022 ?LOS:  LOS: 0 days  ? ? ?Assessment  ?Claire [name] is a 71 y.o. female admitted medically for 03/06/2022  9:46 AM for delirium and failure to thrive. She carries the psychiatric diagnoses of depression and anxiety and has a past medical history below. She has a recent suicide attempt.Psychiatry was consulted for delirium and recent suicide attempt by Claire Mustard, MD.  ? ?At this time, the patient's presentation is most consistent with hypoactive delirium, most likely due to multiple etiologies including but not limited to infection, medications, pain, altered sleep/wake cycle, and limited mobility. The patient would strongly benefit from medical treatment of chronic hypercapnic respiratory failure, possible CAP, cachexia, hyponatrimia possible UTI as well as further investigation for etiologies of delirium. Although she has a recent suicide attempt, she does not remember this (verified with daughter at bedside); she does endorse passive SI on exam but denies any intent or plan and cites her religion as a protective factor. During this time period, minimization of delirogenic insults will be of utmost importance; this includes promoting the normal circadian cycle, minimizing lines/tubes, avoiding deliriogenic medications such as benzodiazepines and anticholinergic medications, and frequently reorienting the patient. Symptomatic treatment for agitation can be provided by antipsychotic medications, though it is important to remember that these do not treat the underlying etiology of delirium. Notably, there can be a time lag effect between treatment of a medical problem and resolution of delirium. This time lag effect may be of longer duration in the elderly, and those with underlying cognitive impairment or brain injury. ? ? ?Diagnoses:  ?Active Hospital problems: ?Principal Problem: ?  Encephalopathy acute ?Active  Problems: ?  Acquired hypothyroidism ?  Hyponatremia ?  Anemia of chronic disease ?  Chronic respiratory failure with hypoxia (HCC) ?  COPD with chronic respiratory failure with hypoxia  ?  Protein-calorie malnutrition, severe (HCC) ?  Mycobacterial disease, pulmonary (HCC) ?  history of recent UTI  ?  Anxiety and depression/PTSD/recent SI attempt ?  ? ? ?Plan  ?## Safety and Observation Level:  ?- Based on my clinical evaluation, I estimate the patient to be at low/moderate risk of self harm in the current setting largely due to delirium ?- At this time, we recommend a routine level of observation; would have low threshold to escalate  ? ? ?## Medications:  ?-- c clonazepam 0.25 mg BID (decreased recently, no need to decrease further) ?- continue to hold wellbutrin (can be deliriogenic) ? ?- start IV thiamine 200 mg qD x3 days ? ? ?## Medical Decision Making Capacity:  ?Not formally assessed ? ?## Further Work-up:  ?-- per primary ? ?-- While receiving haloperidol, please monitor and replete K+ to 4 and Mg2+ to 2 ? ?-- most recent EKG on 4/2 had QtC of 443 ? ?## Disposition:  ?-- per primary ? ? ?Thank you for this consult request. Recommendations have been communicated to the primary team.  We will continue to follow at this time.  ? ?[name] ? ? ?New history  ?Relevant Aspects of Hospital Course:  ?Admitted on 03/06/2022 for encephalopathy. ? ?Patient Report:  ?Patient seen in early afternoon.  He reports that it is 2024 and does not really know where she is or why she is here.  Thinks it is "summer".  Reoriented as appropriate.  Able to name 2 days on days of the week backwards.  Frequently abruptly states "I  am pooping" and attempts to terminate interview; saw patient 2-3 times over 1 hour period due to this. ? ?Patient is difficult to interview and often defers to daughter or son who she consents to remain in the room.  Family confirms a long history of anxiety with depression only becoming  prominent in the last few months culminating in suicide attempt.  They report that patient had significantly decreased oral intake over weeks to months but only really became paranoid about intake since admission.  They report she got about 3 shots of 2.5 mg Zyprexa while in [place] and "has not been the same since".  Reports that prior to suicide attempt, had fairly good memory was very active and in much better health. ? ?At 1 point patient asks if she is "going to die today" and indicates that she is ready to do so.  She reports that she is struggled with depression for some time and is hoping she can feel better.  She denies any active suicidal ideation or intention.  She denies any homicidal ideation and seems taken aback by this question.  She endorses visual hallucinations of seeing bugs on the wall and also is seen to occasionally respond to internal stimuli, most notably informing this Thereasa [name] that there is blood coming through her facemask (there was not). ? ?Discussed use of low-dose as needed Haldol with daughter given poor reaction to olanzapine IM. ? ?ROS:  ?Incorporated in the HPI ? ?Collateral information:  ?Incorporated into HPI; obtained from daughter and son at bedside ? ?Psychiatric History:  ?Information collected from patient's children ?Long history of anxiety, depression more recently ?No suicide attempts prior to a couple of weeks ago on Klonopin ?3-4 lifetime psychiatric hospitalizations ? ?Social History:  ?Lives with adult daughter ? ?Family History:  ?The patient's family history includes Alcohol abuse in her father; Anxiety disorder in her mother and sister; OCD in her daughter. ? ?Medical History: ?Past Medical History:  ?Diagnosis Date  ? Acid reflux   ? Anxiety   ? High blood pressure   ? High serum parathyroid hormone (PTH) 07/19/2017  ? 96 06/28/2014, 162 12/29/16  ? Hypothyroid   ? PTSD (post-traumatic stress disorder)   ? ? ?Surgical History: ?No past surgical history on  file. ? ?Medications:  ? ?Current Facility-Administered Medications:  ?  acetaminophen (TYLENOL) tablet 650 mg, 650 mg, Oral, Q6H PRN **OR** acetaminophen (TYLENOL) suppository 650 mg, 650 mg, Rectal, Q6H PRN, Claire Mustard, MD ?  albuterol (PROVENTIL) (2.5 MG/3ML) 0.083% nebulizer solution 2.5 mg, 2.5 mg, Inhalation, Q2H PRN, Claire Mustard, MD ?  atenolol (TENORMIN) tablet 25 mg, 25 mg, Oral, BID, Claire Mustard, MD ?  cholecalciferol (VITAMIN D3) tablet 1,000 Units, 1,000 Units, Oral, Daily, Claire Mustard, MD, 1,000 Units at 03/06/22 1950 ?  clonazePAM (KLONOPIN) disintegrating tablet 0.25 mg, 0.25 mg, Oral, TID, Claire Mustard, MD, 0.25 mg at 03/07/22 1559 ?  diphenhydrAMINE (BENADRYL) capsule 50 mg, 50 mg, Oral, Once **OR** diphenhydrAMINE (BENADRYL) injection 50 mg, 50 mg, Intravenous, Once, Dahal, Binaya, MD ?  famotidine (PEPCID) tablet 20 mg, 20 mg, Oral, Daily PRN, Claire Mustard, MD ?  feeding supplement (ENSURE ENLIVE / ENSURE PLUS) liquid 237 mL, 237 mL, Oral, TID BM, Dahal, Binaya, MD ?  ferrous sulfate tablet 325 mg, 325 mg, Oral, Q breakfast, Claire Mustard, MD ?  ipratropium-albuterol (DUONEB) 0.5-2.5 (3) MG/3ML nebulizer solution 3 mL, 3 mL, Nebulization, TID, Claire Mustard, MD ?  levothyroxine (SYNTHROID) tablet 75 mcg, 75 mcg, Oral, Daily, Artis Flock,  Revonda StandardAllison, MD ?  mometasone-formoterol [facility](DULERA) 100-5 MCG/ACT inhaler 2 puff, 2 puff, Inhalation, BID, Claire MustardWolfe, Allison, MD ?  predniSONE (DELTASONE) tablet 50 mg, 50 mg, Oral, Q6H, Dahal, Binaya, MD ?  sodium chloride HYPERTONIC 3 % nebulizer solution 4 mL, 4 mL, Nebulization, TID, Karie Fetchlark, Laura P, DO ?  thiamine (B-1) 200 mg in sodium chloride 0.9 % 50 mL IVPB, 200 mg, Intravenous, Daily, Last Rate: 100 mL/hr at 03/07/22 1608, 200 mg at 03/07/22 1608 **FOLLOWED BY** [START ON 03/10/2022] thiamine tablet 100 mg, 100 mg, Oral, Daily, Keano Guggenheim A ?  umeclidinium bromide (INCRUSE ELLIPTA) 62.5 MCG/ACT 1 puff, 1 puff, Inhalation, Daily, Claire MustardWolfe,  Allison, MD ? ?Allergies: ?Allergies  ?Allergen Reactions  ? Bactrim [Sulfamethoxazole-Trimethoprim]   ?  ACUTE RENAL fAILURE/HYPONATREMIA/HYPERKALEMIA  ? Macrodantin [Nitrofurantoin Macrocrystal] Rash  ? Ibuprofen Other (

## 2022-03-07 NOTE — Progress Notes (Signed)
Initial Nutrition Assessment ? ?DOCUMENTATION CODES:  ? ?Underweight ? ?INTERVENTION:  ?Provide Ensure Enlive po TID, each supplement provides 350 kcal and 20 grams of protein. ? ?Encourage adequate PO intake.  ? ?NUTRITION DIAGNOSIS:  ? ?Inadequate oral intake related to  (hallucinations) as evidenced by per patient/family report. ? ?GOAL:  ? ?Patient will meet greater than or equal to 90% of their needs ? ?MONITOR:  ? ?PO intake, Supplement acceptance, Skin, Weight trends, Labs, I & O's ? ?REASON FOR ASSESSMENT:  ? ?Consult ?Poor PO ? ?ASSESSMENT:  ? ?71 y.o. female with PMH significant for HTN, hypothyroidism, hyperparathyroidism, PTSD, anxiety, depression, GERD, COPD on 2 to 3 L oxygen, cavitary MAC, chronic anemia. Presents with altered mental status, shortness of breath. ? ?Pt unavailable at time of visit. Daughter at bedside. She reports pt with hallucinations with poor po intake recently. She reports pt currently thinks her food has been poisoned. She reports pt only able to consume a couple of bites at meals. She reports prior to recent [facility] admission, 3/21-4/1, pt was eating well and since that admission, po intake has been poor. Daughter reports pt will only accept strawberry Ensure. RD to order. Per weight records, pt with a 14% weight loss over the past 3 months, significant for time frame. Daughter to continue to encourage PO intake. Unable to complete Nutrition-Focused physical exam at this time.  ? ?Labs and medications reviewed.  ? ?Diet Order:   ?Diet Order   ? ?       ?  DIET SOFT Room service appropriate? Yes; Fluid consistency: Thin  Diet effective now       ?  ? ?  ?  ? ?  ? ? ?EDUCATION NEEDS:  ? ?Not appropriate for education at this time ? ?Skin:  Skin Assessment: Reviewed RN Assessment ? ?Last BM:  Unknown ? ?Height:  ? ?Ht Readings from Last 1 Encounters:  ?03/07/22 _0  (1.6 m)  ? ? ?Weight:  ? ?Wt Readings from Last 1 Encounters:  ?03/07/22 38.7 kg  ? ?BMI:  Body mass index  is 15.11 kg/m?. ? ?Estimated Nutritional Needs:  ? ?Kcal:  1500-1700 ? ?Protein:  75-85 grams ? ?Fluid:  >/= 1.5 L/day ? ?Corrin Parker, MS, RD, LDN ?RD pager number/after hours weekend pager number on Amion. ? ?

## 2022-03-07 NOTE — Evaluation (Signed)
Occupational Therapy Evaluation ?Patient Details ?Name: Claire [name] ?MRN: [id number] ?DOB: [birth year] ?Today's Date: 03/07/2022 ? ? ?History of Present Illness 71 y.o. female adm 03/06/22 with AMS and SOB. Pt with acute encephalopathy, awaiting brain MRI. CT of head 4/2: "hypodensity in the posterior left putamen/external capsule appears new compared to the 01/06/2022 exam but is technically age indeterminate". Admitted to psychiatry on 02/22/22 for suicide attempt and [facility] admission 3/23-4/1 with 2 falls. PMH: HTN, hypothyroidism, PTSD, anxiety, depression, COPD on 2-3L, anemia  ? ?Clinical Impression ?  ?Pt poor historian, PLOF and home set up obtained from chart review. Pt appearing to require increased ADL/mobility assistance since [facility] admission 3/23-4/1. Pt with decreased participation in OT evaluation, frequently drifting back to sleep. Pt able to nod head "yes" when asked about pain, however unable to point to/specify pain location. Pt able to spontaneously bridge, roll, and kick with BLE's at bed level during session, however is inconsistent and at times resistive to following commands. Pt max A +2 for rolling during pad change/to scoot pt up in bed. Pt resistive to sitting EOB and UE ROM attempts. Pt presenting with impairments listed below, will follow acutely. Recommend SNF at d/c. ?   ? ?Recommendations for follow up therapy are one component of a multi-disciplinary discharge planning process, led by the attending physician.  Recommendations may be updated based on patient status, additional functional criteria and insurance authorization.  ? ?Follow Up Recommendations ? Skilled nursing-short term rehab (<3 hours/day)  ?  ?Assistance Recommended at Discharge Frequent or constant Supervision/Assistance  ?Patient can return home with the following Two people to help with walking and/or transfers;Two people to help with bathing/dressing/bathroom;Assistance with cooking/housework;Assistance with  feeding;Direct supervision/assist for medications management;Direct supervision/assist for financial management;Assist for transportation;Help with stairs or ramp for entrance ? ?  ?Functional Status Assessment ? Patient has had a recent decline in their functional status and demonstrates the ability to make significant improvements in function in a reasonable and predictable amount of time.  ?Equipment Recommendations ? None recommended by OT;Other (comment) (defer to next venue of care)  ?  ?Recommendations for Other Services   ? ? ?  ?Precautions / Restrictions Precautions ?Precautions: Fall;Other (comment) ?Precaution Comments: agitated/aggressive intermittently ?Restrictions ?Weight Bearing Restrictions: No  ? ?  ? ?Mobility Bed Mobility ?Overal bed mobility: Needs Assistance ?Bed Mobility: Rolling ?Rolling: Max assist ?  ?  ?  ?  ?General bed mobility comments: able to bridge and roll spontaneously, however does not follow commands for bed mobility tasks ?  ? ?Transfers ?  ?  ?  ?  ?  ?  ?  ?  ?  ?General transfer comment: deferred ?  ? ?  ?Balance   ?  ?  ?Sitting balance - Comments: deferred, pt resisting ?  ?  ?  ?Standing balance comment: deferred ?  ?  ?  ?  ?  ?  ?  ?  ?  ?  ?  ?   ? ?ADL either performed or assessed with clinical judgement  ? ?ADL Overall ADL's : Needs assistance/impaired ?Eating/Feeding: Sitting;Maximal assistance ?  ?Grooming: Maximal assistance;Bed level ?  ?Upper Body Bathing: Maximal assistance;Sitting ?  ?Lower Body Bathing: Maximal assistance;Bed level ?  ?Upper Body Dressing : Maximal assistance;Bed level ?  ?Lower Body Dressing: Maximal assistance;Bed level ?  ?Toilet Transfer: Maximal assistance;+2 for physical assistance ?  ?Toileting- Clothing Manipulation and Hygiene: Maximal assistance;Bed level ?  ?Tub/ Shower Transfer: Maximal assistance ?  ?Functional  mobility during ADLs: Maximal assistance;+2 for physical assistance ?   ? ? ? ?Vision   ?Vision Assessment?: No apparent  visual deficits  ?   ?Perception   ?  ?Praxis   ?  ? ?Pertinent Vitals/Pain Pain Assessment ?Pain Assessment: Faces ?Pain Location: generalized, unable to specify ?Pain Intervention(s): Limited activity within patient's tolerance, Monitored during session  ? ? ? ?Hand Dominance   ?  ?Extremity/Trunk Assessment Upper Extremity Assessment ?Upper Extremity Assessment: Generalized weakness;Difficult to assess due to impaired cognition (pt resistive with ROM testing) ?  ?Lower Extremity Assessment ?Lower Extremity Assessment: Defer to PT evaluation ?  ?  ?  ?Communication Communication ?Communication: No difficulties ?  ?Cognition Arousal/Alertness: Lethargic ?Behavior During Therapy: Restless, Agitated ?Overall Cognitive Status: Impaired/Different from baseline ?Area of Impairment: Following commands, Safety/judgement ?  ?  ?  ?  ?  ?  ?  ?  ?Orientation Level:  (does not respond when asked) ?Current Attention Level: Selective ?  ?Following Commands: Follows one step commands inconsistently ?  ?  ?  ?General Comments: pt did not communcate with therapist during session, states she is in pain, attempts to point to location of pain but is unclear ?  ?  ?General Comments  VSS on RA ? ?  ?Exercises   ?  ?Shoulder Instructions    ? ? ?Home Living Family/patient expects to be discharged to:: Private residence ?Living Arrangements: Children ?Available Help at Discharge: Family;Available PRN/intermittently ?Type of Home: Apartment ?Home Access: Level entry ?  ?  ?Home Layout: One level ?  ?  ?Bathroom Shower/Tub: Tub/shower unit ?  ?  ?  ?  ?  ?  ?Additional Comments: Was living with her daughter recently per PT note ?  ? ?  ?Prior Functioning/Environment Prior Level of Function : Independent/Modified Independent (per chart 01/08/22) ?  ?  ?  ?  ?  ?  ?Mobility Comments: appears to have been needing increased assistance with AMS since her admission at [facility] 3/23-4/1 ?  ?  ? ?  ?  ?OT Problem List: Decreased strength;Decreased  range of motion;Decreased activity tolerance;Impaired balance (sitting and/or standing);Decreased coordination;Decreased safety awareness ?  ?   ?OT Treatment/Interventions: Self-care/ADL training;Therapeutic exercise;DME and/or AE instruction;Therapeutic activities;Cognitive remediation/compensation;Patient/family education;Balance training  ?  ?OT Goals(Current goals can be found in the care plan section) Acute Rehab OT Goals ?Patient Stated Goal: none stated ?OT Goal Formulation: Patient unable to participate in goal setting ?Time For Goal Achievement: 03/21/22 ?Potential to Achieve Goals: Fair ?ADL Goals ?Pt Will Perform Grooming: with min assist;bed level ?Pt Will Perform Upper Body Bathing: with min assist;bed level;with mod assist ?Pt Will Perform Lower Body Bathing: with min assist;with mod assist;sitting/lateral leans ?Pt Will Transfer to Toilet: with min assist;with mod assist;with +2 assist;bedside commode;stand pivot transfer;squat pivot transfer  ?OT Frequency: Min 2X/week ?  ? ?Co-evaluation   ?  ?  ?  ?  ? ?  ?AM-PAC OT "6 Clicks" Daily Activity     ?Outcome Measure Help from another person eating meals?: A Lot ?Help from another person taking care of personal grooming?: A Lot ?Help from another person toileting, which includes using toliet, bedpan, or urinal?: Total ?Help from another person bathing (including washing, rinsing, drying)?: Total ?Help from another person to put on and taking off regular upper body clothing?: Total ?Help from another person to put on and taking off regular lower body clothing?: Total ?6 Click Score: 8 ?  ?End of Session Equipment Utilized During Treatment:  Oxygen ?Nurse Communication: Mobility status;Other (comment) (pt's communication) ? ?Activity Tolerance: Patient limited by fatigue;Patient limited by lethargy ?Patient left: in bed;with call bell/phone within reach;with bed alarm set ? ?OT Visit Diagnosis: Unsteadiness on feet (R26.81);Other abnormalities of gait and  mobility (R26.89);Muscle weakness (generalized) (M62.81);Other symptoms and signs involving cognitive function  ?              ?Time: 2947-6546 ?OT Time Calculation (min): 19 min ?Charges:  OT General Charges ?$O

## 2022-03-08 ENCOUNTER — Inpatient Hospital Stay (HOSPITAL_COMMUNITY): Payer: Medicare Other

## 2022-03-08 DIAGNOSIS — J439 Emphysema, unspecified: Secondary | ICD-10-CM | POA: Diagnosis not present

## 2022-03-08 DIAGNOSIS — J9611 Chronic respiratory failure with hypoxia: Secondary | ICD-10-CM | POA: Diagnosis not present

## 2022-03-08 DIAGNOSIS — A310 Pulmonary mycobacterial infection: Secondary | ICD-10-CM

## 2022-03-08 DIAGNOSIS — J449 Chronic obstructive pulmonary disease, unspecified: Secondary | ICD-10-CM

## 2022-03-08 DIAGNOSIS — E039 Hypothyroidism, unspecified: Secondary | ICD-10-CM

## 2022-03-08 DIAGNOSIS — G9340 Encephalopathy, unspecified: Secondary | ICD-10-CM | POA: Diagnosis not present

## 2022-03-08 DIAGNOSIS — J189 Pneumonia, unspecified organism: Secondary | ICD-10-CM

## 2022-03-08 DIAGNOSIS — Z7189 Other specified counseling: Secondary | ICD-10-CM | POA: Diagnosis not present

## 2022-03-08 DIAGNOSIS — J9612 Chronic respiratory failure with hypercapnia: Secondary | ICD-10-CM | POA: Diagnosis not present

## 2022-03-08 DIAGNOSIS — G9341 Metabolic encephalopathy: Secondary | ICD-10-CM

## 2022-03-08 DIAGNOSIS — Z66 Do not resuscitate: Secondary | ICD-10-CM | POA: Diagnosis not present

## 2022-03-08 DIAGNOSIS — Z515 Encounter for palliative care: Secondary | ICD-10-CM

## 2022-03-08 LAB — CBC WITH DIFFERENTIAL/PLATELET
Abs Immature Granulocytes: 0.04 10*3/uL (ref 0.00–0.07)
Basophils Absolute: 0 10*3/uL (ref 0.0–0.1)
Basophils Relative: 0 %
Eosinophils Absolute: 0 10*3/uL (ref 0.0–0.5)
Eosinophils Relative: 0 %
HCT: 26.8 % — ABNORMAL LOW (ref 36.0–46.0)
Hemoglobin: 8.4 g/dL — ABNORMAL LOW (ref 12.0–15.0)
Immature Granulocytes: 1 %
Lymphocytes Relative: 7 %
Lymphs Abs: 0.6 10*3/uL — ABNORMAL LOW (ref 0.7–4.0)
MCH: 30.4 pg (ref 26.0–34.0)
MCHC: 31.3 g/dL (ref 30.0–36.0)
MCV: 97.1 fL (ref 80.0–100.0)
Monocytes Absolute: 0.1 10*3/uL (ref 0.1–1.0)
Monocytes Relative: 1 %
Neutro Abs: 7.3 10*3/uL (ref 1.7–7.7)
Neutrophils Relative %: 91 %
Platelets: 299 10*3/uL (ref 150–400)
RBC: 2.76 MIL/uL — ABNORMAL LOW (ref 3.87–5.11)
RDW: 13.4 % (ref 11.5–15.5)
WBC: 8 10*3/uL (ref 4.0–10.5)
nRBC: 0 % (ref 0.0–0.2)

## 2022-03-08 LAB — BASIC METABOLIC PANEL
Anion gap: 9 (ref 5–15)
BUN: 22 mg/dL (ref 8–23)
CO2: 40 mmol/L — ABNORMAL HIGH (ref 22–32)
Calcium: 9 mg/dL (ref 8.9–10.3)
Chloride: 84 mmol/L — ABNORMAL LOW (ref 98–111)
Creatinine, Ser: 0.8 mg/dL (ref 0.44–1.00)
GFR, Estimated: >60 mL/min (ref >60)
Glucose, Bld: 170 mg/dL — ABNORMAL HIGH (ref 70–99)
Potassium: 4.1 mmol/L (ref 3.5–5.1)
Sodium: 133 mmol/L — ABNORMAL LOW (ref 135–145)

## 2022-03-08 MED ORDER — PIPERACILLIN-TAZOBACTAM 3.375 G IVPB
3.3750 g | Freq: Three times a day (TID) | INTRAVENOUS | Status: DC
Start: 2022-03-08 — End: 2022-03-15
  Administered 2022-03-08 – 2022-03-14 (×18): 3.375 g via INTRAVENOUS
  Filled 2022-03-08 (×20): qty 50

## 2022-03-08 MED ORDER — DICYCLOMINE HCL 10 MG PO CAPS
10.0000 mg | ORAL_CAPSULE | Freq: Three times a day (TID) | ORAL | Status: DC
Start: 2022-03-09 — End: 2022-03-15
  Administered 2022-03-09 – 2022-03-14 (×18): 10 mg via ORAL
  Filled 2022-03-08 (×18): qty 1

## 2022-03-08 MED ORDER — IOHEXOL 300 MG/ML  SOLN
80.0000 mL | Freq: Once | INTRAMUSCULAR | Status: AC | PRN
  Administered 2022-03-08: 80 mL via INTRAVENOUS

## 2022-03-08 MED ORDER — ACETAMINOPHEN 500 MG PO TABS
1000.0000 mg | ORAL_TABLET | Freq: Three times a day (TID) | ORAL | Status: DC
  Administered 2022-03-08 – 2022-03-14 (×19): 1000 mg via ORAL
  Filled 2022-03-08 (×19): qty 2

## 2022-03-08 MED ORDER — PIPERACILLIN-TAZOBACTAM 3.375 G IVPB 30 MIN
3.3750 g | Freq: Once | INTRAVENOUS | Status: AC
  Administered 2022-03-08: 3.375 g via INTRAVENOUS
  Filled 2022-03-08: qty 50

## 2022-03-08 NOTE — TOC Progression Note (Signed)
Transition of Care (TOC) - Progression Note  ? ? ?Patient Details  ?Name: Claire [name] ?MRN: [id number] ?Date of Birth: 07-19-1951 ? ?Transition of Care (TOC) CM/SW Contact  ?Delilah Shan, LCSWA ?Phone Number: ?03/08/2022, 2:59 PM ? ?Clinical Narrative:    ? ?[facility] to see if they can offer SNF bed for patient. No other SNF bed offers currently. CSW informed patients daughter. Patients daughter gave CSW permission to fax out further for possible SNF placement for patient. CSW will continue to follow. ? ?Expected Discharge Plan: Skilled Nursing Facility ?Barriers to Discharge: Continued Medical Work up ? ?Expected Discharge Plan and Services ?Expected Discharge Plan: Skilled Nursing Facility ?In-house Referral: Clinical Social Work ?Discharge Planning Services: CM Consult ?Post Acute Care Choice: Durable Medical Equipment ?Living arrangements for the past 2 months: Single Family Home ?                ?DME Arranged: [employer] ?DME Agency: [employer] ?Date DME Agency Contacted: 03/06/22 ?Time DME Agency Contacted: [phone] ?Representative spoke with at DME Agency: Leavy Cella ?  ?  ?  ?  ?  ? ? ?Social Determinants of Health (SDOH) Interventions ?  ? ?Readmission Risk Interventions ?   ? View : No data to display.  ?  ?  ?  ? ? ?

## 2022-03-08 NOTE — Progress Notes (Signed)
Pt refusing use of Bi-Pap tonight despite being educated on its benefits. Adequately saturating on 2.5L [place] in NAD. Will continue to monitor. ?

## 2022-03-08 NOTE — Assessment & Plan Note (Addendum)
Chronic respiratory failure with hypoxia and hypercapnia (HCC) ?Multifocal pneumonia ?Continue supplemental oxygen.  ?Bullous emphysema.  Continue bronchodilators, oxygen. ?BIPAP at night. Per pulmonology needs noninvasive ventilation at night. ?Zosyn per pulmonology, continue IV in the hospital.  Per pulmonary can switch to fluoroquinolone To complete her therapy course of 7 to 10 days. ?Discussed with the flex respite therapist on 4/7, medication resumed. ?Also order placed to maintain oxygenation between 88 and 92% as the family is concerned with overcorrection. ?

## 2022-03-08 NOTE — Assessment & Plan Note (Deleted)
--  Zosyn per pulmonology ?

## 2022-03-08 NOTE — Hospital Course (Addendum)
71 year old woman PMH including chronic hypoxic respiratory failure, COPD, cavitary MAC, PTSD, anxiety, depression presenting with confusion and shortness of breath.   ?Found to have acute encephalopathy which is multifactorial as well as sepsis secondary to pneumonia. ? ?3/23-4/1 admitted at [facility] for hyponatremia. ?02/17/22 admitted for Klonopin overdose and then transferred to behavioral health for stabilization. ?4/2 admitted to the hospital this time.  Pulmonary consulted. ?4/3 psychiatry consulted. ?4/4 palliative care consult. ? ?
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
## 2022-03-08 NOTE — Consult Note (Signed)
[facility] New Face-to-Face Psychiatric Evaluation ? ? ?Service Date: March 08, 2022 ?LOS:  LOS: 1 day  ? ? ?Assessment  ?Claire [name] is a 71 y.o. female admitted medically for 03/06/2022  9:46 AM for delirium and failure to thrive. She carries the psychiatric diagnoses of depression and anxiety and has a past medical history below. She has a recent suicide attempt.Psychiatry was consulted for delirium and recent suicide attempt by Orland MustardAllison Wolfe, MD.  ? ?At this time, the patient's presentation is most consistent with hypoactive delirium, most likely due to multiple etiologies including but not limited to infection, medications, pain, altered sleep/wake cycle, and limited mobility. The patient would strongly benefit from medical treatment of chronic hypercapnic respiratory failure, possible CAP, cachexia, hyponatrimia possible UTI as well as further investigation for etiologies of delirium. Although she has a recent suicide attempt, she does not remember this (verified with daughter at bedside); she does endorse passive SI on exam but denies any intent or plan and cites her religion as a protective factor. During this time period, minimization of delirogenic insults will be of utmost importance; this includes promoting the normal circadian cycle, minimizing lines/tubes, avoiding deliriogenic medications such as benzodiazepines and anticholinergic medications, and frequently reorienting the patient. Symptomatic treatment for agitation can be provided by antipsychotic medications, though it is important to remember that these do not treat the underlying etiology of delirium. Notably, there can be a time lag effect between treatment of a medical problem and resolution of delirium. This time lag effect may be of longer duration in the elderly, and those with underlying cognitive impairment or brain injury. ? ?4/4: Pt with significantly improved attention, organization, etc. Ongoing paranoia towards  daughter + hospital staff although this is the first time I have evaluated her without family present. Declined antipsychotics citing prior bad s/e. Delirium improved likely 2/2 thiamine and/or pulmonary intervention.  ? ? ?Diagnoses:  ?Active Hospital problems: ?Principal Problem: ?  Encephalopathy acute ?Active Problems: ?  Acquired hypothyroidism ?  Hyponatremia ?  Anemia of chronic disease ?  Chronic respiratory failure with hypoxia (HCC) ?  COPD with chronic respiratory failure with hypoxia  ?  Protein-calorie malnutrition, severe (HCC) ?  Mycobacterial disease, pulmonary (HCC) ?  history of recent UTI  ?  Anxiety and depression/PTSD/recent SI attempt ?  ? ? ?Plan  ?## Safety and Observation Level:  ?- Based on my clinical evaluation, I estimate the patient to be at low/moderate risk of self harm in the current setting largely due to delirium ?- At this time, we recommend a routine level of observation; would have low threshold to escalate  ? ? ?## Medications:  ?-- c clonazepam 0.25 mg BID (decreased recently, no need to decrease further) ?- continue to hold wellbutrin (can be deliriogenic) ? ?- c  IV thiamine 200 mg qD x3 days ? ? ?## Medical Decision Making Capacity:  ?Not formally assessed ? ?## Further Work-up:  ?-- per primary ? ?-- While receiving haloperidol, please monitor and replete K+ to 4 and Mg2+ to 2 ? ?-- most recent EKG on 4/2 had QtC of 443 ? ?## Disposition:  ?-- per primary ? ? ?Thank you for this consult request. Recommendations have been communicated to the primary team.  We will continue to follow at this time.  ? ?Claire [name] ? ? ?New history  ?Relevant Aspects of Hospital Course:  ?Admitted on 03/06/2022 for encephalopathy. ? ?Patient Report:  ?Patient seen in late afternoon; no  family at bedside. She is oriented to self, generally to situation, year and season. Knows next holiday is Easter; DOWB with no issue. Able to sustain better attention and eye contact throughout exam.  Discusses that she has had about 1.5 boosts and has a goal of 4 today. Still feels like her daughter/potentially hospital staff is poisoning her. Asked medical student to leave the room. Not noted to RIS and denies hallucinations; when I talked to her about the "blood coming out of my face" yesterday she stated that my mask turned red. Denies SI, HI, etc.  ? ?ROS:  ?Incorporated in the HPI ? ?Collateral information:  ?Incorporated into HPI; obtained from daughter and son at bedside ? ?Psychiatric History:  ?Information collected from patient's children ?Long history of anxiety, depression more recently ?No suicide attempts prior to a couple of weeks ago on Klonopin ?3-4 lifetime psychiatric hospitalizations ? ?Social History:  ?Lives with adult daughter ? ?Family History:  ?The patient's family history includes Alcohol abuse in her father; Anxiety disorder in her mother and sister; OCD in her daughter. ? ?Medical History: ?Past Medical History:  ?Diagnosis Date  ? Acid reflux   ? Anxiety   ? High blood pressure   ? High serum parathyroid hormone (PTH) 07/19/2017  ? 96 06/28/2014, 162 12/29/16  ? Hypothyroid   ? PTSD (post-traumatic stress disorder)   ? ? ?Surgical History: ?No past surgical history on file. ? ?Medications:  ? ?Current Facility-Administered Medications:  ?  acetaminophen (TYLENOL) tablet 1,000 mg, 1,000 mg, Oral, TID, Shaffer, Luvenia Redden, NP, 1,000 mg at 03/08/22 1558 ?  albuterol (PROVENTIL) (2.5 MG/3ML) 0.083% nebulizer solution 2.5 mg, 2.5 mg, Inhalation, Q2H PRN, Orland Mustard, MD ?  atenolol (TENORMIN) tablet 25 mg, 25 mg, Oral, BID, Orland Mustard, MD, 25 mg at 03/08/22 0915 ?  cholecalciferol (VITAMIN D3) tablet 1,000 Units, 1,000 Units, Oral, Daily, Orland Mustard, MD, 1,000 Units at 03/08/22 0915 ?  clonazePAM (KLONOPIN) disintegrating tablet 0.25 mg, 0.25 mg, Oral, TID, Orland Mustard, MD, 0.25 mg at 03/08/22 1559 ?  famotidine (PEPCID) tablet 20 mg, 20 mg, Oral, Daily PRN, Orland Mustard,  MD ?  feeding supplement (ENSURE ENLIVE / ENSURE PLUS) liquid 237 mL, 237 mL, Oral, TID BM, Dahal, Binaya, MD, 237 mL at 03/08/22 1401 ?  ferrous sulfate tablet 325 mg, 325 mg, Oral, Q breakfast, Orland Mustard, MD, 325 mg at 03/08/22 0915 ?  haloperidol (HALDOL) tablet 0.5 mg, 0.5 mg, Oral, Q8H PRN **OR** haloperidol lactate (HALDOL) injection 0.5 mg, 0.5 mg, Intramuscular, Q8H PRN, Ayush Boulet A ?  ipratropium-albuterol (DUONEB) 0.5-2.5 (3) MG/3ML nebulizer solution 3 mL, 3 mL, Nebulization, TID, Orland Mustard, MD ?  levothyroxine (SYNTHROID) tablet 75 mcg, 75 mcg, Oral, Daily, Orland Mustard, MD, 75 mcg at 03/08/22 9476 ?  mometasone-formoterol (DULERA) 100-5 MCG/ACT inhaler 2 puff, 2 puff, Inhalation, BID, Orland Mustard, MD, 2 puff at 03/07/22 2012 ?  piperacillin-tazobactam (ZOSYN) IVPB 3.375 g, 3.375 g, Intravenous, Q8H, Pham, Minh Q, RPH-CPP ?  piperacillin-tazobactam (ZOSYN) IVPB 3.375 g, 3.375 g, Intravenous, Once, Pham, Minh Q, RPH-CPP, Last Rate: 100 mL/hr at 03/08/22 1611, 3.375 g at 03/08/22 1611 ?  sodium chloride HYPERTONIC 3 % nebulizer solution 4 mL, 4 mL, Nebulization, TID, Karie Fetch P, DO ?  thiamine (B-1) 200 mg in sodium chloride 0.9 % 50 mL IVPB, 200 mg, Intravenous, Daily, Last Rate: 100 mL/hr at 03/08/22 1153, 200 mg at 03/08/22 1153 **FOLLOWED BY** [START ON 03/10/2022] thiamine tablet 100 mg, 100 mg,  Oral, Daily, Yakub Lodes A ?  umeclidinium bromide (INCRUSE ELLIPTA) 62.5 MCG/ACT 1 puff, 1 puff, Inhalation, Daily, Orland Mustard, MD ? ?Allergies: ?Allergies  ?Allergen Reactions  ? Bactrim [Sulfamethoxazole-Trimethoprim]   ?  ACUTE RENAL fAILURE/HYPONATREMIA/HYPERKALEMIA  ? Macrodantin [Nitrofurantoin Macrocrystal] Rash  ? Ibuprofen Other (See Comments)  ?  unknown  ? Ivp Dye [Iodinated Contrast Media] Other (See Comments)  ?  unknown  ? Medrol [Methylprednisolone]   ?  Hallucinations  ? Trimethoprim   ?  Fatigue/weakness/no appetite  ? Zithromax [Azithromycin]   ?   diarrhea  ? ? ? ?  ?Objective  ?Vital signs:  ?Temp:  [98 ?F (36.7 ?C)-98.4 ?F (36.9 ?C)] 98 ?F (36.7 ?C) (04/04 1209) ?Pulse Rate:  [66-87] 71 (04/04 1209) ?Resp:  [14-16] 16 (04/04 1209) ?BP: (117-134)/(69-84)

## 2022-03-08 NOTE — Progress Notes (Signed)
Pharmacy Antibiotic Note ? ?Claire [name] is a 71 y.o. female admitted on 03/06/2022 with pneumonia.  Pharmacy has been consulted for zosyn dosing. ? ?Pt with chronic respiratory failure including MAC who was admitted for AMS. Plan for empiric coverage with zosyn for PNA. Plan to treat MAC outpt with ID ?CT>>large infiltrate consistent with PNA.  ? ?Scr<1 ? ?Plan: ?Zosyn 3.375g IV x1 over 30 mins then 3.375g IV q8 extended infusion ? ?Height: _0  (160 cm) ?Weight: 38.7 kg (85 lb 5.1 oz) ?IBW/kg (Calculated) : 52.4 ? ?Temp (24hrs), Avg:98.1 ?F (36.7 ?C), Min:98 ?F (36.7 ?C), Max:98.4 ?F (36.9 ?C) ? ?Recent Labs  ?Lab 03/06/22 ?1155 03/07/22 ?0157 03/08/22 ?0302  ?WBC 14.9* 11.5* 8.0  ?CREATININE 0.72 0.75 0.80  ?LATICACIDVEN 1.3  --   --   ?  ?Estimated Creatinine Clearance: 39.4 mL/min (by C-G formula based on SCr of 0.8 mg/dL).   ? ?Allergies  ?Allergen Reactions  ? Bactrim [Sulfamethoxazole-Trimethoprim]   ?  ACUTE RENAL fAILURE/HYPONATREMIA/HYPERKALEMIA  ? Macrodantin [Nitrofurantoin Macrocrystal] Rash  ? Ibuprofen Other (See Comments)  ?  unknown  ? Ivp Dye [Iodinated Contrast Media] Other (See Comments)  ?  unknown  ? Medrol [Methylprednisolone]   ?  Hallucinations  ? Trimethoprim   ?  Fatigue/weakness/no appetite  ? Zithromax [Azithromycin]   ?  diarrhea  ? ? ?Antimicrobials this admission: ?4/4 zosyn>> ? ?Dose adjustments this admission: ? ? ?Microbiology results: ?4/2 blood>>ngtd ? ?Onnie Boer, PharmD, BCIDP, AAHIVP, CPP ?Infectious Disease Pharmacist ?03/08/2022 3:30 PM ? ? ?

## 2022-03-08 NOTE — Progress Notes (Signed)
? ?NAME:  Claire [name], MRN:  [id number], DOB:  Apr 07, 1951, LOS: 1 ?ADMISSION DATE:  03/06/2022, CONSULTATION DATE:  03/06/22 ?REFERRING MD:  Ursula Beath, DO CHIEF COMPLAINT:  Altered mental status  ? ?History of Present Illness:  ?71 year old female with emphysema, chronic hypoxemic respiratory failure, cavitary MAC pulmonary infection, hx of TB exposure, HTN who presents to ED for altered mental status. EMS was called and found patient with oxygen reportedly in the 60s. In the ED, she is agitated, hallucinating and profoundly altered from baseline. Normally she is polite and appropriately engages in conversation in the office.  ? ?She was had recent hospitalizations including and 2/3-2/6 for COPD exacerbation and newly diagnosed cavitary lung lesions secondary to [place], 2/14 for anemia, 3/23-4/1 for hyponatremia, 3/21 for SI with clonazepam overdose. Of note, patient diagnosed with UTI but no culture. Treated with fosfomycin and discharged with cefdinir ? ?In between these hospitalizations, she was seen in the Pulmonary office by me (Dr. Loanne Drilling) with plans to see ID and potentially start treatment for MAC. GOC had been discussed with conflict between patient and daughter regarding starting treatment, leading to the hospitalization for SI prior to her ID outpatient visit. She has had noncompliance with treatment though unclear if underlying psych issue vs organic causes. After her recent discharge from [facility] on 4/1, patient has been threatening daughter and hallucinating. EMS was called and found patient hypoxemic on room air to the 60s. ? ?PCCM consulted for pulmonary recommendations. ? ? ?Pertinent  Medical History  ?emphysema, chronic hypoxemic respiratory failure, cavitary MAC pulmonary infection, hx of TB exposure, HTN, anxiety, depression, hypothyroidism ? ?Significant Hospital Events: ?Including procedures, antibiotic start and stop dates in addition to other pertinent events   ?4/2 Admit to [facility] ? ?Interim History  / Subjective:  ?She denies complaints. ? ?Objective   ?Blood pressure 134/84, pulse 71, temperature 98 ?F (36.7 ?C), temperature source Oral, resp. rate 16, height _0  (1.6 m), weight 38.7 kg, SpO2 99 %. ?   ?   ? ?Intake/Output Summary (Last 24 hours) at 03/08/2022 1508 ?Last data filed at 03/07/2022 2000 ?Gross per 24 hour  ?Intake 49.28 ml  ?Output --  ?Net 49.28 ml  ? ?Filed Weights  ? 03/07/22 0026  ?Weight: 38.7 kg  ? ? ?Physical Exam: ?General: cachectic, chronically ill appearing woman lying in bed in NAD ?HENT temporal wasting ?Pulm no tachypnea or accessory muscle use, no rhonchi. Comfortably breathing on [place] ?Card S1S2, RRR ?Abd soft, NT, thin ?Ext minimal muscle mass, no edema  ?Neuro: Confused- thinks it is March 10 2023, but redirectable with cues to the correct date. Moving all extremities.  ? ?CT chest> severe emphysema, small cavities scattered. Mildly improved consolidation with air bronchograms in LUL, worse small opacity in medial LLL.  ? ?Na+ 133 ?WBC 11.5> 8.0 ? ?Resolved Hospital Problem list   ? ? ?Assessment & Plan:  ? ?Acute metabolic encephalopathy- potentially due to infection, but chronic malnutrition and overall frailty is worrisome.  ?Acute delirium ?Cavitary MAC ?Chronic hypoxemic and hypercarbic respiratory failure  ?COPD ? ?Plan ?-Reviewed CT Chest-- empiric antibiotics for pneumonia is appropriate unless non-infectious cause of delirium seems most likely-- needs anti-pseudomonal coverage empirically with chronic cavitary disease and bronchiectasis. AVOID MACROLIDES GIVEN HISTORY OF MAC. Prefer zosyn to cefepime with encephalopathy. ?-Pulmonary hygiene regimen ?-wean supplemental O2 ?-no need for treatment for MAC as inpatient ? ?Cachexia and malnutrition ?-protein supplements ? ?Acute metabolic encephalopathy ?-workup per primary ? ? ?Best Practice (  right click and "Reselect all SmartList Selections" daily)  ?Per primary ? ?Julian Hy, DO 03/08/22 3:36 PM ?[facility] ? ? ?

## 2022-03-08 NOTE — Progress Notes (Signed)
?Progress Note ? ? ?Patient: Claire [name] DXI:[id number] DOB: 1951/01/17 DOA: 03/06/2022     1 ?DOS: the patient was seen and examined on 03/08/2022 ?  ?Brief hospital course: ?71 year old woman PMH including chronic hypoxic respiratory failure, COPD, cavitary MAC, PTSD, anxiety, depression presenting with confusion and shortness of breath.  Recently admitted at [facility] 3/23-4/1 for hyponatremia. Initially admitted to psychiatry on 02/22/22 for suicide attempt by clonazepam overdose. Also had new UTI with d/c home on cefdinir. WBC was 25.6 and thought secondary to UTI. Hyponatremia thought to be due to SIADH. She also had 2 falls in hospital and on one hit her head. Ct with no acute finding. She has not been taking pills at home and daughter states after her hospitalization at the beginning of February this year she has become increasingly confused. Admitted for acute on chronic anemia on 2/14 at [facility] with 1 unit of PRBC. Per this hospital stay patient made threatening statements to staff and psychiatry was consulted with concern for underlying personality disorder at this time. In between hospitalization seen by pulmonology with outpatient plans to see the infectious disease and consider treatment for MAC. PRESENTED/ADMITTED 4/2 for acute encephalopathy, chronic hypoxic respiratory failure. ? ?Assessment and Plan: ?* Acute metabolic encephalopathy ?-- With delirium earlier, ongoing paranoia, odd affect. ?-- Laboratory studies unrevealing.  Appreciate psychiatry involvement.  Etiology unclear.  Being treated with thiamine. ?-- Better today ? ?COPD (chronic obstructive pulmonary disease) (HCC) ?-- Bullous emphysema.  Continue bronchodilators, oxygen. ? ?Chronic respiratory failure with hypoxia and hypercapnia (HCC) ?-- Continue supplemental oxygen. BIPAP at night. Per pulmonology needs noninvasive ventilation at night. ?-- per Dr. Loanne Drilling "Patient continues to exhibit signs of hypercapnia associated with chronic  respiratory failures secondary to severe COPD. Patient requires the use of NIV both QHS and daytime to help with exacerbation periods. The use of the NIV will treat patient's high PCO2 levels and can reduce risk of exacerbations and future hospitalizations when used at nigh and during the day. Patient will need this advanced settings in conjunction with her current medication regimen; BiPAP is not an option due to its functional limitations and the severity of the patient's condition." ? ?Multifocal pneumonia ?--Zosyn per pulmonology ? ?Anxiety and depression/PTSD/recent SI attempt ?--+visual hallucinations, paranoia ?--management per psychiatry ? ?Mycobacterial disease, pulmonary ([place]) ?--Followed by pulmonology and ID outpatient. Tx recommended outpatient but patient was considering. ?--no inpatient treatment per pulmonology ? ?Protein-calorie malnutrition, severe ([place]) ?--BMI 15.11 underweight ?--poor oral intake ?--management per dietician  ? ?Anemia of chronic disease ?--Baseline hgb appears to be around 8-10, has seen hematology outpatient ?--Stable, continue oral iron and follow  ? ?Acquired hypothyroidism ?--TSH high but free T4 WNL  ?--Had not been taking medication, continue home dose for now  ? ?Hyponatremia ?--mild, stable, follow. TSH as above and AM cortisol WNL ? ? ?  ? ?Subjective:  ?Feels ok ?Breathing ok ?Poor appetite ?"Watch your back" ?"Don't call anybody" ? ?Physical Exam: ?Vitals:  ? 03/07/22 1618 03/07/22 2229 03/08/22 0400 03/08/22 1209  ?BP: (!) 151/80 133/69 117/75 134/84  ?Pulse: 87 87 66 71  ?Resp: _0 ?Temp: 98 ?F (36.7 ?C)  98.4 ?F (36.9 ?C) 98 ?F (36.7 ?C)  ?TempSrc: Oral Axillary Oral Oral  ?SpO2: 97% 97% 98% 99%  ?Weight:      ?Height:      ? ?Physical Exam ?Vitals reviewed.  ?Constitutional:   ?   General: She is not in acute distress. ?   Appearance:  She is ill-appearing. She is not toxic-appearing.  ?Cardiovascular:  ?   Rate and Rhythm: Normal rate and regular rhythm.   ?   Heart sounds: No murmur heard. ?   Comments: SR ?Pulmonary:  ?   Effort: Pulmonary effort is normal. No respiratory distress.  ?   Breath sounds: No wheezing, rhonchi or rales.  ?Neurological:  ?   Mental Status: She is alert.  ?Psychiatric:  ?   Comments: Odd affect, difficult to gauge mood.  ? ? ?Data Reviewed: ? ?CBG stable ?Hgb stable 8.4 ?MRI brain no acute finding ?Chest CT with severe COPD with bullous emphysema. Large infiltrate LUL. Multifocal infiltrates LLL. Cavitary lesion LLL. ? ?Family Communication: patient instructed that I not call her daughter ? ?Disposition: ?Status is: Inpatient ?Remains inpatient appropriate because: pneumonia ? Planned Discharge Destination: Skilled nursing facility ? ? ? ?Time spent: 35 minutes ? ?Author: ?Murray Hodgkins, MD ?03/08/2022 6:14 PM ? ?For on call review [email or web address].  ?

## 2022-03-09 DIAGNOSIS — G9340 Encephalopathy, unspecified: Secondary | ICD-10-CM

## 2022-03-09 DIAGNOSIS — J189 Pneumonia, unspecified organism: Secondary | ICD-10-CM | POA: Diagnosis not present

## 2022-03-09 DIAGNOSIS — Z7189 Other specified counseling: Secondary | ICD-10-CM | POA: Diagnosis not present

## 2022-03-09 DIAGNOSIS — J9611 Chronic respiratory failure with hypoxia: Secondary | ICD-10-CM | POA: Diagnosis not present

## 2022-03-09 DIAGNOSIS — A319 Mycobacterial infection, unspecified: Secondary | ICD-10-CM | POA: Diagnosis not present

## 2022-03-09 DIAGNOSIS — Z515 Encounter for palliative care: Secondary | ICD-10-CM | POA: Diagnosis not present

## 2022-03-09 DIAGNOSIS — G9341 Metabolic encephalopathy: Secondary | ICD-10-CM | POA: Diagnosis not present

## 2022-03-09 LAB — VITAMIN B1: Vitamin B1 (Thiamine): 108.7 nmol/L (ref 66.5–200.0)

## 2022-03-09 MED ORDER — OLANZAPINE 2.5 MG PO TABS
2.5000 mg | ORAL_TABLET | Freq: Every day | ORAL | Status: DC
  Administered 2022-03-09 – 2022-03-13 (×5): 2.5 mg via ORAL
  Filled 2022-03-09 (×6): qty 1

## 2022-03-09 MED ORDER — ORAL CARE MOUTH RINSE
15.0000 mL | Freq: Two times a day (BID) | OROMUCOSAL | Status: DC
  Administered 2022-03-09 – 2022-03-13 (×5): 15 mL via OROMUCOSAL

## 2022-03-09 MED ORDER — ENOXAPARIN SODIUM 30 MG/0.3ML IJ SOSY
30.0000 mg | PREFILLED_SYRINGE | INTRAMUSCULAR | Status: DC
  Administered 2022-03-09 – 2022-03-13 (×5): 30 mg via SUBCUTANEOUS
  Filled 2022-03-09 (×5): qty 0.3

## 2022-03-09 NOTE — Progress Notes (Signed)
Pt refused all TX'S ?

## 2022-03-09 NOTE — TOC Progression Note (Addendum)
Transition of Care (TOC) - Progression Note  ? ? ?Patient Details  ?Name: Claire [name] ?MRN: [id number] ?Date of Birth: 1951-04-09 ? ?Transition of Care (TOC) CM/SW Contact  ?Milas Gain, LCSWA ?Phone Number: ?03/09/2022, 3:22 PM ? ?Clinical Narrative:    ? ?CSW received call from [place] with [facility] who confirmed they can make bed offer for patient. CSW provided SNF bed offer to patients daughter [name]. [name] accepted SNF bed offer for patient. CSW informed [place] with [facility].CSW informed [facility] patient will need bipap at snf. CSW called respiratory who gave CSW bipap settings for patient to provide to SNF.CSW provided [facility] with bipap settings for patient. CSW started insurance authorization for patient reference # [id number]. CSW will continue to follow and assist with patients dc planning needs. ? ?Expected Discharge Plan: [place] ?Barriers to Discharge: Continued Medical Work up ? ?Expected Discharge Plan and Services ?Expected Discharge Plan: [place] ?In-house Referral: Clinical Social Work ?Discharge Planning Services: CM Consult ?Post Acute Care Choice: Durable Medical Equipment ?Living arrangements for the past 2 months: [place] ?                ?DME Arranged: Bipap ?DME Agency: [employer] ?Date DME Agency Contacted: 03/06/22 ?Time DME Agency Contacted: [phone] ?Representative spoke with at DME Agency: Delana Meyer ?  ?  ?  ?  ?  ? ? ?Social Determinants of Health (SDOH) Interventions ?  ? ?Readmission Risk Interventions ?   ? View : No data to display.  ?  ?  ?  ? ? ?
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
## 2022-03-09 NOTE — Progress Notes (Signed)
Physical Therapy Treatment ?Patient Details ?Name: Claire [name] ?MRN: [id number] ?DOB: 06-Nov-1951 ?Today's Date: 03/09/2022 ? ? ?History of Present Illness 71 y.o. female adm 03/06/22 with AMS and SOB. Pt with acute encephalopathy, awaiting brain MRI. CT of head 4/2: "hypodensity in the posterior left putamen/external capsule appears new compared to the 01/06/2022 exam but is technically age indeterminate". Admitted to psychiatry on 02/22/22 for suicide attempt and [facility] admission 3/23-4/1 with 2 falls. PMH: HTN, hypothyroidism, PTSD, anxiety, depression, COPD on 2-3L, anemia ? ?  ?PT Comments  ? ? Pt remains pleasantly confused during today's tx session but demos improved participation overall, ambulating short distances in hallway with seated rest break.  Ambulation distance is limited by subjective c/o SOB, however, VS remain soft but stable throughout tx.  Daughter is contacted via phone re: D/C planning secondary to pt's improved function during tx session with AMPAC indicating home upon D/C.  Daughter voices concerns re: pt's mental status making it hard for her and her brother to care for her, but does states that they are with pt 24/7.  It would be beneficial at this point in time for CM to discuss assistance for family that is available in home, or potentially other arrangement, I.e. ALF.  ?Recommendations for follow up therapy are one component of a multi-disciplinary discharge planning process, led by the attending physician.  Recommendations may be updated based on patient status, additional functional criteria and insurance authorization. ? ?Follow Up Recommendations ? Other (comment) (Physically would be appropriate for return home with HHPT, however, daughter has concerns re: managing pt's mental status changes at home.  Would be beneficial for CM to discuss additional support or alternative living arrangement i.e. ALF) ?  ?  ?Assistance Recommended at Discharge Intermittent Supervision/Assistance   ?Patient can return home with the following A little help with walking and/or transfers;A little help with bathing/dressing/bathroom;Assistance with cooking/housework;Direct supervision/assist for financial management;Assist for transportation;Direct supervision/assist for medications management;Help with stairs or ramp for entrance ?  ?Equipment Recommendations ? Rollator (4 wheels)  ?  ?Recommendations for Other Services   ? ? ?  ?Precautions / Restrictions Precautions ?Precautions: None ?Restrictions ?Weight Bearing Restrictions: No  ?  ? ?Mobility ? Bed Mobility ?Overal bed mobility: Modified Independent ?Bed Mobility: Supine to Sit ?  ?  ?Supine to sit: Modified independent (Device/Increase time) ?  ?  ?General bed mobility comments: Needs inc encouragement to sit EOB x 2, but is able to complete activity with mod I.  Good sitting balance noted.  Able to scoot to EOB independently. C/o SOB at EOB but O2 sats remain stable at 96%.  IV noted to be out and NSG notified. ?Patient Response: Anxious ? ?Transfers ?  ?Equipment used: Rollator (4 wheels) ?Transfers: Sit to/from Stand ?Sit to Stand: Supervision ?  ?  ?  ?  ?  ?General transfer comment: Performs sit > stand from EOB with supervision only.  Needs VCs for safety with hand placement and cues on how to lock and unlock rollator. Demos good standing balance with use of AD.  Pt noted to be soiled upon return to room and requires max A for self care prior to transferring into chair. ?  ? ?Ambulation/Gait ?Ambulation/Gait assistance: Min guard ?Gait Distance (Feet): 80 Feet ?Assistive device: Rollator (4 wheels) ?Gait Pattern/deviations: WFL(Within Functional Limits) ?  ?  ?  ?General Gait Details: Pt amb x 40 ft in hall before requring seated rest break due to SOB.  Pt O2 sats are noted  to be stable at 92%.  After 5 min seated rest break, pt able to amb 40 ft back to room with good cadence and without LOB.  Pt educated on PLB at end of activity to manage SOB,  demos partial understanding. ? ? ?Stairs ?  ?  ?  ?  ?  ? ? ?Wheelchair Mobility ?  ? ?Modified Rankin (Stroke Patients Only) ?  ? ? ?  ?Balance Overall balance assessment: Modified Independent ?  ?Sitting balance-Leahy Scale: Good ?  ?  ?  ?Standing balance-Leahy Scale: Fair ?  ?  ?  ?  ?  ?  ?  ?  ?  ?  ?  ?  ?  ? ?  ?Cognition Arousal/Alertness: Awake/alert ?Behavior During Therapy: Anxious ?Overall Cognitive Status: Impaired/Different from baseline ?Area of Impairment: Orientation ?  ?  ?  ?  ?  ?  ?  ?  ?  ?  ?  ?Following Commands: Follows one step commands consistently ?  ?  ?  ?General Comments: Pt. is supine in bed when PT arrives.  Pleasantly confused and thinks she is at church.  No c/o pain, but does report fatigue and is reluctant to get OOB.  Needs max encouragement to sit EOB to eat breakfast and to amb in halls.  BP soft but stable at rest. ?  ?  ? ?  ?Exercises   ? ?  ?General Comments General comments (skin integrity, edema, etc.): Pt reports feeling anxious throughout treatment, requires frequent VCs and max encouragement to stay on task. ?  ?  ? ?Pertinent Vitals/Pain Pain Assessment ?Pain Assessment: 0-10 ?Pain Score: 0-No pain  ? ? ?Home Living   ?  ?  ?  ?  ?  ?  ?  ?  ?  ?   ?  ?Prior Function    ?  ?  ?   ? ?PT Goals (current goals can now be found in the care plan section) Progress towards PT goals: Progressing toward goals ? ?  ?Frequency ? ? ? Min 3X/week ? ? ? ?  ?PT Plan Discharge plan needs to be updated (Pt. functionally safe to return home with assist from family, however, daughter has concerns re: ability to manage patient with mental status changes.  Per daughter, her and/or her brother are with their mom full-time.)  ? ? ?Co-evaluation   ?  ?  ?  ?  ? ?  ?AM-PAC PT "6 Clicks" Mobility   ?Outcome Measure ? Help needed turning from your back to your side while in a flat bed without using bedrails?: None ?Help needed moving from lying on your back to sitting on the side of a flat bed  without using bedrails?: None ?Help needed moving to and from a bed to a chair (including a wheelchair)?: A Little ?Help needed standing up from a chair using your arms (e.g., wheelchair or bedside chair)?: A Little ?Help needed to walk in hospital room?: A Little ?Help needed climbing 3-5 steps with a railing? : A Little ?6 Click Score: 20 ? ?  ?End of Session Equipment Utilized During Treatment: Gait belt ?Activity Tolerance: Patient limited by fatigue ?Patient left: in chair;with chair alarm set;with call bell/phone within reach ?Nurse Communication: Mobility status ?  ?  ? ? ?Time: 1000-1040 ?PT Time Calculation (min) (ACUTE ONLY): 40 min ? ?Charges:  $Gait Training: 8-22 mins ?$Therapeutic Activity: 23-37 mins          ?          ? ?  Kariann Wecker A. Meiah Zamudio, PT, DPT ?Acute Rehabilitation Services ?Office: [phone]  ? ? ?[facility] [place] ?03/09/2022, 11:12 AM ? ?
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
## 2022-03-09 NOTE — Care Management (Signed)
03-09-22 1532 CSW is assisting the patient with SNF bed. If the patient is eligible for SNF and insurance approves, the SNF will order the BIPAP. If the patient has to return home, [facility] Rx information is in the shadow chart for NIV for home; Rx will need to be signed and the narrative will need to be placed in Epic for insurance approval. Case Manager will continue to follow.  ?

## 2022-03-09 NOTE — Progress Notes (Signed)
RT at bedside to give scheduled breathing treatments. Pt refused. ?

## 2022-03-09 NOTE — Progress Notes (Signed)
Pt refused all treatment from RT this AM. ?

## 2022-03-09 NOTE — Progress Notes (Signed)
?Progress Note ? ? ?Patient: Claire [name] PXT:[id number] DOB: May 29, 1951 DOA: 03/06/2022     Hospitalization day: 2 ?DOS: the patient was seen and examined on 03/09/2022 ? ?Brief hospital course: ?71 year old woman PMH including chronic hypoxic respiratory failure, COPD, cavitary MAC, PTSD, anxiety, depression presenting with confusion and shortness of breath.  Recently admitted at [facility] 3/23-4/1 for hyponatremia. Initially admitted to psychiatry on 02/22/22 for suicide attempt by clonazepam overdose. Also had new UTI with d/c home on cefdinir. WBC was 25.6 and thought secondary to UTI. Hyponatremia thought to be due to SIADH. She also had 2 falls in hospital and on one hit her head. Ct with no acute finding. She has not been taking pills at home and daughter states after her hospitalization at the beginning of February this year she has become increasingly confused. Admitted for acute on chronic anemia on 2/14 at [facility] with 1 unit of PRBC. Per this hospital stay patient made threatening statements to staff and psychiatry was consulted with concern for underlying personality disorder at this time. In between hospitalization seen by pulmonology with outpatient plans to see the infectious disease and consider treatment for MAC. PRESENTED/ADMITTED 4/2 for acute encephalopathy, chronic hypoxic respiratory failure. ? ? ?Assessment and Plan: ?* Acute metabolic encephalopathy ?Sepsis secondary to multifocal pneumonia. ?COPD without any exacerbation. ?Chronic respiratory failure with hypoxia and hypercarbia. ?Brought in from home due to concerns with confusion and shortness of breath. ?Found to have acute on chronic hypercarbic respiratory failure as well as pneumonia. ?Met SIRS criteria on admission with tachypnea and leukocytosis with evidence of infection with pneumonia. ?Reportedly delirium is actually improving in the hospital. ?Psychiatry is following ?Highly appreciate their assistance. ?Patient is actually going to be  started on Zyprexa which will help significantly with her paranoia. ?Also treated with IV thiamine.  Although B1 level is normal.  No transition to p.o. thiamine. ?Suspect there is also a component of her mood disorder responsible for her delirium. ? ?COPD (chronic obstructive pulmonary disease) ([place]) ?Chronic respiratory failure with hypoxia and hypercapnia (HCC) ?Multifocal pneumonia ?Continue supplemental oxygen.  ?Bullous emphysema.  Continue bronchodilators, oxygen. ?BIPAP at night. Per pulmonology needs noninvasive ventilation at night. ?Zosyn per pulmonology, continue IV in the hospital.  Per pulmonary can switch to fluoroquinolone To complete her therapy course of 7 to 10 days. ? ?Cavitary MAC infection. ?Followed by pulmonology and ID outpatient.  ?Tx recommended outpatient but patient was considering. ?no inpatient treatment per pulmonology  ?not a candidate as well. ? ?Protein-calorie malnutrition, severe ([place]) ?Failure to thrive ?poor oral intake ?Underweight ?Body mass index is 15.11 kg/m?[name] ?Nutrition Problem: Inadequate oral intake ?Etiology:  (hallucinations) ?Nutrition Interventions: ?Interventions: Ensure Enlive (each supplement provides 350kcal and 20 grams of protein)  ? ?Anemia of chronic disease ?Baseline hgb appears to be around 8-10, has seen hematology outpatient ?Stable, continue oral iron and follow  ? ?Acquired hypothyroidism ?TSH high but free T4 WNL  ?Had not been taking medication, continue home dose for now  ? ?Hyponatremia ?mild, stable, follow. TSH as above and AM cortisol WNL ? ?Goals of care conversation. ?Extensive discussion with daughter with regards to patient's current prognosis. ?Patient with poor reserve in the setting of severe protein calorie malnutrition and failure to thrive with underweight status in the setting of chronic pulmonary disease COPD, respiratory failure and MAC infection confounded by ongoing issues with mood disorder which includes anxiety, depression,  PTSD as well as recent suicidal attempt. ?Prognosis is guarded in the setting ?Patient currently DNR  on admission. ?I expect that the patient will continue to have ongoing recurrent infections due to her poor reserves and deconditioning which would lead to further change in medications. ?If the patient does not improve with SNF and change in medications with psychiatry, ideally patient would be a candidate for hospice. ? ?Subjective: No nausea no vomiting no fever no chills.  Flat affect.  Initially reported some vaginal burning but currently no complaint. ? ?Physical Exam: ?Vitals:  ? 03/08/22 1945 03/09/22 0514 03/09/22 1700 03/09/22 1958  ?BP: 108/73 112/67 102/64 114/64  ?Pulse: 73 (!) 58 67 66  ?Resp: _0 ?Temp: 97.9 ?F (36.6 ?C) (!) 97 ?F (36.1 ?C) (!) 97.5 ?F (36.4 ?C) 98 ?F (36.7 ?C)  ?TempSrc: Oral Axillary Oral Oral  ?SpO2: 97% 100% 97% 96%  ?Weight:      ?Height:      ? ?General: Appear in mild distress; no visible Abnormal Neck Mass Or lumps, Conjunctiva normal ?Cardiovascular: S1 and S2 Present, aortic systolic Murmur, ?Respiratory: increased respiratory effort, Bilateral Air entry present and bilateral Crackles, no wheezes ?Abdomen: Bowel Sound present, Non tender ?Extremities: no Pedal edema ?Neurology: alert and oriented to place and person ?Gait not checked due to patient safety concerns  ? ?Data Reviewed: ?I have Reviewed nursing notes, Vitals, and Lab results since pt's last encounter. Pertinent lab results CBC and BMP ?I have ordered test including CBC and BMP ?I have discussed pt's care plan and test results with palliative care and psychiatry.  ? ?Family Communication: Discussed with daughter on the phone ? ?Disposition: ?Status is: Inpatient ?Remains inpatient appropriate because: Ongoing issues with medication changes with regards to her psychiatric illness. ? ?Author: ?Berle Mull, MD ?03/09/2022 8:04 PM ? ?For on call review [email or web address] ?

## 2022-03-09 NOTE — Progress Notes (Signed)
?                                                   ?Daily Progress Note  ? ?Patient Name: Claire [name]       Date: 03/09/2022 ?DOB: 1951-03-08  Age: 71 y.o. MRN#: [id number] ?Attending Physician: Lavina Hamman, MD ?Primary Care Physician: Donella Stade, PA-C ?Admit Date: 03/06/2022 ? ?Reason for Consultation/Follow-up: Establishing goals of care ? ?Subjective: ?Patient tells me she feels better today and she walked a lot ? ?Length of Stay: 2 ? ?Current Medications: ?Scheduled Meds:  ? acetaminophen  1,000 mg Oral TID  ? atenolol  25 mg Oral BID  ? cholecalciferol  1,000 Units Oral Daily  ? clonazepam  0.25 mg Oral TID  ? dicyclomine  10 mg Oral TID AC  ? enoxaparin (LOVENOX) injection  30 mg Subcutaneous Q24H  ? feeding supplement  237 mL Oral TID BM  ? ferrous sulfate  325 mg Oral Q breakfast  ? levothyroxine  75 mcg Oral Daily  ? mouth rinse  15 mL Mouth Rinse BID  ? mometasone-formoterol  2 puff Inhalation BID  ? sodium chloride HYPERTONIC  4 mL Nebulization TID  ? [START ON 03/10/2022] thiamine  100 mg Oral Daily  ? umeclidinium bromide  1 puff Inhalation Daily  ? ? ?Continuous Infusions: ? piperacillin-tazobactam (ZOSYN)  IV Stopped (03/09/22 0912)  ? ? ?PRN Meds: ?albuterol, famotidine, haloperidol **OR** haloperidol lactate ? ?Physical Exam ?Constitutional:   ?   General: She is not in acute distress. ?   Appearance: She is ill-appearing.  ?   Comments: Thin, frail  ?Pulmonary:  ?   Effort: Pulmonary effort is normal.  ?Skin: ?   General: Skin is warm and dry.  ?Neurological:  ?   Mental Status: She is alert.  ?   Comments: Oriented to self but disoriented to time place and situation  ?         ? ?Vital Signs: BP 112/67 (BP Location: Left Arm)   Pulse (!) 58   Temp (!) 97 ?F (36.1 ?C) (Axillary)   Resp 14   Ht _0  (1.6 m)   Wt 38.7 kg   SpO2 100%   BMI 15.11 kg/m?   ?SpO2: SpO2: 100 % ?O2 Device: O2 Device: Nasal Cannula ?O2 Flow Rate: O2 Flow Rate (L/min): 2 L/min ? ?Intake/output summary:  ?Intake/Output Summary (Last 24 hours) at 03/09/2022 1504 ?Last data filed at 03/09/2022 1438 ?Gross per 24 hour  ?Intake 431.08 ml  ?Output --  ?Net 431.08 ml  ? ?LBM: Last BM Date : 03/08/22 ?Baseline Weight: Weight: 38.7 kg ?Most recent weight: Weight: 38.7 kg ? ?     ?Palliative Assessment/Data: PPS 50% ? ? ? ?Flowsheet Rows   ? ?Flowsheet Row Most Recent Value  ?Intake Tab   ?Referral Department Hospitalist  ?Unit at Time of Referral Intermediate Care Unit  ?Palliative Care Primary Diagnosis Pulmonary  ?Date Notified 03/07/22  ?Palliative Care Type New Palliative care  ?Reason for referral Clarify Goals of Care  ?Date of Admission 03/06/22  ?Date first seen by Palliative Care 03/08/22  ?# of days Palliative referral response time 1 Day(s)  ?# of days IP prior to Palliative referral 1  ?Clinical Assessment   ?Psychosocial & Spiritual Assessment   ?Palliative Care Outcomes   ? ?  ? ? ?  Patient Active Problem List  ? Diagnosis Date Noted  ? COPD (chronic obstructive pulmonary disease) ([place]) 03/08/2022  ? Acute metabolic encephalopathy 19/37/9024  ? history of recent UTI  03/06/2022  ? Anxiety and depression/PTSD/recent SI attempt 03/06/2022  ? Mycobacterial disease, pulmonary ([place]) 02/10/2022  ? Cavitary lung disease 02/08/2022  ? Personality disorder ([place]) 01/20/2022  ? Chronic respiratory failure with hypoxia and hypercapnia ([place]) 01/07/2022  ? Hypomagnesemia 01/07/2022  ? Tobacco use 01/07/2022  ? Protein-calorie malnutrition, severe ([place]) 01/07/2022  ? Physical debility 01/07/2022  ? Cavitary pneumonia 01/06/2022  ? Multifocal pneumonia 01/03/2022  ? Severe malnutrition ([place]) 01/03/2022  ? AKI (acute kidney injury) ([place]) 01/03/2022  ? Weakness 12/31/2021  ? Current smoker 11/03/2021  ? Stress due to family tension 09/02/2021  ? Nasal congestion 08/06/2021  ? History of UTI 06/02/2021  ?  Acute cystitis with hematuria 04/26/2021  ? Cloudy urine 04/23/2021  ? Medication management 03/15/2021  ? History of Clostridioides difficile colitis 02/22/2021  ? Mild protein-calorie malnutrition ([place]) 01/19/2021  ? Clostridium difficile colitis 09/11/2020  ? Lower extremity edema 07/29/2020  ? Constipation 07/13/2020  ? Body mass index (BMI) of 19 or less in adult 05/08/2020  ? Malnutrition of mild degree ([place]) 05/06/2020  ? Anxiety about health 03/23/2020  ? Pernicious anemia 03/23/2020  ? Macrocytic anemia 03/10/2020  ? Elevated ferritin 03/10/2020  ? Irritable bowel syndrome with diarrhea 03/10/2020  ? Chronic rhinitis 01/31/2020  ? Menopausal vaginal dryness 12/24/2019  ? History of hypokalemia 10/09/2019  ? Leg cramps 05/29/2019  ? Recurrent UTI 05/07/2019  ? DOE (dyspnea on exertion) 05/09/2018  ? High serum parathyroid hormone (PTH) 07/19/2017  ? Normocytic anemia 07/19/2017  ? Low serum vitamin B12 05/14/2017  ? Vitamin D deficiency 05/14/2017  ? Depressed mood 05/14/2017  ? Anemia of chronic disease 05/11/2017  ? Stage 3a chronic kidney disease ([place]) 01/16/2017  ? Iron deficiency anemia 01/16/2017  ? Centrilobular emphysema ([place]) 12/20/2016  ? Hyponatremia 12/19/2016  ? Hyperkalemia 12/19/2016  ? Chronic use of benzodiazepine for therapeutic purpose 08/11/2015  ? Acquired hypothyroidism 11/24/2014  ? BP (high blood pressure) 07/24/2012  ? PTSD (post-traumatic stress disorder) 11/10/2011  ? Neurosis, anxiety, panic type 11/10/2011  ? ? ?Palliative Care Assessment & Plan  ? ?HPI: ?71 y.o. female  with past medical history of HTN, hypothyroidism, hyperparathyroidism, PTSD, anxiety, depression, GERD, COPD on 2 to 3 L oxygen, cavitary MAC, and chronic anemia admitted on 03/06/2022 with AMS and shortness of breath.  She has recently been admitted to the hospital multiple times for COPD exacerbation, anemia, clonazepam overdose, and hyponatremia.  This admission,chest x-ray showed left lung opacities concerning  for persistent multifocal infection or inflammatory process.  Patient is being seen by psychiatry this hospitalization and it is felt that her presentation is most consistent with hypoactive delirium.  Patient with significant weight loss and poor appetite.  PMT consulted to discuss goals of care. ? ?Assessment: ?Patient seems about the same today during my interaction with her.  She tells me about walking with physical therapy earlier but then also asks me if I will please take her to the doctor.When I inform her she is in the hospital she seems surprised. ? ?RN reports some ongoing refusal of medications. ? ?Spoke with patient's daughter Claire [name] via telephone.  We reviewed patient's condition and mental status today.  [name] tells me she was told earlier today by transitions of care team that no facilities had accepted patient. Claire [name] is quite concerned about  this as she does not feel that she can provide care for patient at home as she has her own health concerns.  She is also concerned about patient's mental status at home.  She is requesting that transitions of care team continue to pursue placement option for patient. ? ?We again reviewed that patient is likely eligible for hospice support in the home.  We discussed philosophy of hospice care and type of support provided.  Claire [name] shares she feels they are not quite ready for hospice support though she is open to further discussion about it if facility placement cannot be obtained. ? ?Claire [name] does share that she feels that continued hospitalization and not benefiting patient and likely causing distress and discomfort. She is considering different approach to care but further conversation is needed as we allow time for outcomes in current situation.  ? ?Recommendations/Plan: ?Daughter requesting facility placement - She is aware that so far no facilities are accepting patient ?Hospice discussed and daughter open to further conversation though at this point she is most  interested in pursuing facility placement ?Continue scheduled tylenol for ongoing back pain r/t falls ? ?Code Status: ?DNR ? ?Care plan was discussed with patient, RN, daughter Claire [name] ? ?Thank you for allowing the

## 2022-03-10 DIAGNOSIS — Z7189 Other specified counseling: Secondary | ICD-10-CM

## 2022-03-10 LAB — BASIC METABOLIC PANEL
Anion gap: 4 — ABNORMAL LOW (ref 5–15)
BUN: 34 mg/dL — ABNORMAL HIGH (ref 8–23)
CO2: 44 mmol/L — ABNORMAL HIGH (ref 22–32)
Calcium: 8.9 mg/dL (ref 8.9–10.3)
Chloride: 88 mmol/L — ABNORMAL LOW (ref 98–111)
Creatinine, Ser: 1.16 mg/dL — ABNORMAL HIGH (ref 0.44–1.00)
GFR, Estimated: 50 mL/min — ABNORMAL LOW (ref >60)
Glucose, Bld: 79 mg/dL (ref 70–99)
Potassium: 3.3 mmol/L — ABNORMAL LOW (ref 3.5–5.1)
Sodium: 136 mmol/L (ref 135–145)

## 2022-03-10 LAB — CBC
HCT: 27.2 % — ABNORMAL LOW (ref 36.0–46.0)
Hemoglobin: 8.3 g/dL — ABNORMAL LOW (ref 12.0–15.0)
MCH: 30.6 pg (ref 26.0–34.0)
MCHC: 30.5 g/dL (ref 30.0–36.0)
MCV: 100.4 fL — ABNORMAL HIGH (ref 80.0–100.0)
Platelets: 294 10*3/uL (ref 150–400)
RBC: 2.71 MIL/uL — ABNORMAL LOW (ref 3.87–5.11)
RDW: 13.8 % (ref 11.5–15.5)
WBC: 8.4 10*3/uL (ref 4.0–10.5)
nRBC: 0 % (ref 0.0–0.2)

## 2022-03-10 LAB — MAGNESIUM: Magnesium: 1.7 mg/dL (ref 1.7–2.4)

## 2022-03-10 MED ORDER — SODIUM CHLORIDE 3 % IN NEBU: 4.0000 mL | INHALATION_SOLUTION | Freq: Three times a day (TID) | RESPIRATORY_TRACT | Status: DC | PRN

## 2022-03-10 MED ORDER — MOMETASONE FURO-FORMOTEROL FUM 100-5 MCG/ACT IN AERO
2.0000 | INHALATION_SPRAY | Freq: Two times a day (BID) | RESPIRATORY_TRACT | Status: DC | PRN
  Administered 2022-03-10: 2 via RESPIRATORY_TRACT

## 2022-03-10 MED ORDER — POTASSIUM CHLORIDE CRYS ER 20 MEQ PO TBCR
40.0000 meq | EXTENDED_RELEASE_TABLET | Freq: Once | ORAL | Status: AC
  Administered 2022-03-10: 40 meq via ORAL
  Filled 2022-03-10: qty 2

## 2022-03-10 MED ORDER — POTASSIUM CHLORIDE CRYS ER 20 MEQ PO TBCR
40.0000 meq | EXTENDED_RELEASE_TABLET | Freq: Once | ORAL | Status: DC
  Filled 2022-03-10: qty 2

## 2022-03-10 NOTE — Assessment & Plan Note (Addendum)
Extensive discussion with daughter with regards to patient's current prognosis. ?Patient with poor reserve in the setting of severe protein calorie malnutrition and failure to thrive with underweight status in the setting of chronic pulmonary disease COPD, respiratory failure and MAC infection confounded by ongoing issues with mood disorder which includes anxiety, depression, PTSD as well as recent suicidal attempt. ?Prognosis is guarded in the setting ?Patient currently DNR on admission. ?I expect that the patient will continue to have ongoing recurrent infections due to her poor reserves and deconditioning which would lead to further change in medications. ?If the patient does not improve with SNF and change in medications with psychiatry, ideally patient would be a candidate for hospice. ?Ongoing issues with poor p.o. intake now resulting in acute kidney injury now. ?Currently back on IV fluids. ?We will monitor response. ?

## 2022-03-10 NOTE — Consult Note (Signed)
[facility] New Face-to-Face Psychiatric Evaluation ? ? ?Service Date: March 10, 2022 ?LOS:  LOS: 3 days  ? ? ?Assessment  ?Claire [name] is a 71 y.o. female admitted medically for 03/06/2022  9:46 AM for delirium and failure to thrive. She carries the psychiatric diagnoses of depression and anxiety and has a past medical history below. She has a recent suicide attempt.Psychiatry was consulted for delirium and recent suicide attempt by Orma Flaming, MD.  ? ?At this time, the patient's presentation is most consistent with hypoactive delirium, most likely due to multiple etiologies including but not limited to infection, medications, pain, altered sleep/wake cycle, and limited mobility. The patient would strongly benefit from medical treatment of chronic hypercapnic respiratory failure, possible CAP, cachexia, hyponatrimia possible UTI as well as further investigation for etiologies of delirium. Although she has a recent suicide attempt, she does not remember this (verified with daughter at bedside); she does endorse passive SI on exam but denies any intent or plan and cites her religion as a protective factor. During this time period, minimization of delirogenic insults will be of utmost importance; this includes promoting the normal circadian cycle, minimizing lines/tubes, avoiding deliriogenic medications such as benzodiazepines and anticholinergic medications, and frequently reorienting the patient. Symptomatic treatment for agitation can be provided by antipsychotic medications, though it is important to remember that these do not treat the underlying etiology of delirium. Notably, there can be a time lag effect between treatment of a medical problem and resolution of delirium. This time lag effect may be of longer duration in the elderly, and those with underlying cognitive impairment or brain injury. ? ?4/4: Pt with significantly improved attention, organization, etc. Ongoing paranoia towards  daughter + hospital staff although this is the first time I have evaluated her without family present. Declined antipsychotics citing prior bad s/e. Delirium improved likely 2/2 thiamine and/or pulmonary intervention.  ? ?4/5: Continued improvement in attention and thought organization. Ongoing paranoia although pt makes efforts to hide this (indicating improving insight). Now agreeable to trial olanzapine 2.5 mg, had previously tolerated this in IM form at [facility]. Discussed r/b/se including hypotension; did tell pt it was an antipsychotic.  ? ?4/6: Pt more agitated, confused although saw in AM instead of PM with no family at bedside. ? ?Diagnoses:  ?Active Hospital problems: ?Principal Problem: ?  Acute metabolic encephalopathy ?Active Problems: ?  Acquired hypothyroidism ?  Hyponatremia ?  Anemia of chronic disease ?  Multifocal pneumonia ?  Chronic respiratory failure with hypoxia and hypercapnia (HCC) ?  Protein-calorie malnutrition, severe ([place]) ?  Mycobacterial disease, pulmonary ([place]) ?  Anxiety and depression/PTSD/recent SI attempt ?  COPD (chronic obstructive pulmonary disease) ([place]) ?  Encephalopathy acute ?  ? ? ?Plan  ?## Safety and Observation Level:  ?- Based on my clinical evaluation, I estimate the patient to be at low/moderate risk of self harm in the current setting largely due to delirium ?- At this time, we recommend a routine level of observation; would have low threshold to escalate given recovering metabolic encephalopathy  ? ? ?## Medications:  ?-- c clonazepam 0.25 mg BID (decreased recently, no need to decrease further) ?- continue to hold wellbutrin (can be deliriogenic) ?- c olanzapine 2.5 mg QHS  ? ?- c  IV thiamine 200 mg qD x3 days followed by oral ? ? ?## Medical Decision Making Capacity:  ?Not formally assessed ? ?## Further Work-up:  ?-- per primary ? ?-- While receiving olanzapine, please monitor and  replete K+ to 4 and Mg2+ to 2 ? ?-- most recent EKG on 4/2 had QtC of  443 ? ?## Disposition:  ?-- per primary ? ? ?Thank you for this consult request. Recommendations have been communicated to the primary team.  We will continue to follow at this time.  ? ?Claris Che A Dimitria Ketchum ? ? ?New history  ?Relevant Aspects of Hospital Course:  ?Admitted on 03/06/2022 for encephalopathy. ? ?Patient Report:  ?Patient seen in AM alone. Denied SI, HI. Could not fully assess AH/VH or orientation given events below. Had eaten some of breakfast and made progress on brownie since yesterday. Began talking about stomach pain after eating and got distracted by oxygen sensor; refused to talk further after alarm silenced until nursing staff brought new sensor. Attempted to discuss discrepency between pt's concern about oxygen sensor being in place vs refusal of all RT treatments; pt denied refusing these. After nursing staff arrived she accused me of removing her oxygen sensor and pulling out her IV which had been removed. Ultimately terminated interview as it became clear my presence was agitating her. ? ?ROS:  ?Incorporated in the HPI ? ?Collateral information:  ?None today ? ?Psychiatric/social/family/medical history /allergies  ?See initial consult note. ? ?Medications:  ? ?Current Facility-Administered Medications:  ?  acetaminophen (TYLENOL) tablet 1,000 mg, 1,000 mg, Oral, TID, Shaffer, Luvenia Redden, NP, 1,000 mg at 03/10/22 1055 ?  albuterol (PROVENTIL) (2.5 MG/3ML) 0.083% nebulizer solution 2.5 mg, 2.5 mg, Inhalation, Q2H PRN, Orland Mustard, MD ?  atenolol (TENORMIN) tablet 25 mg, 25 mg, Oral, BID, Orland Mustard, MD, 25 mg at 03/10/22 1055 ?  cholecalciferol (VITAMIN D3) tablet 1,000 Units, 1,000 Units, Oral, Daily, Orland Mustard, MD, 1,000 Units at 03/10/22 1055 ?  clonazePAM (KLONOPIN) disintegrating tablet 0.25 mg, 0.25 mg, Oral, TID, Orland Mustard, MD, 0.25 mg at 03/10/22 1055 ?  dicyclomine (BENTYL) capsule 10 mg, 10 mg, Oral, TID AC, Standley Brooking, MD, 10 mg at 03/10/22 0848 ?   enoxaparin (LOVENOX) injection 30 mg, 30 mg, Subcutaneous, Q24H, Rolly Salter, MD, 30 mg at 03/09/22 1817 ?  famotidine (PEPCID) tablet 20 mg, 20 mg, Oral, Daily PRN, Orland Mustard, MD ?  feeding supplement (ENSURE ENLIVE / ENSURE PLUS) liquid 237 mL, 237 mL, Oral, TID BM, Dahal, Binaya, MD, 237 mL at 03/10/22 1100 ?  ferrous sulfate tablet 325 mg, 325 mg, Oral, Q breakfast, Orland Mustard, MD, 325 mg at 03/10/22 1610 ?  haloperidol (HALDOL) tablet 0.5 mg, 0.5 mg, Oral, Q8H PRN **OR** haloperidol lactate (HALDOL) injection 0.5 mg, 0.5 mg, Intramuscular, Q8H PRN, Leonette Tischer A ?  levothyroxine (SYNTHROID) tablet 75 mcg, 75 mcg, Oral, Daily, Orland Mustard, MD, 75 mcg at 03/09/22 9604 ?  MEDLINE mouth rinse, 15 mL, Mouth Rinse, BID, Rolly Salter, MD, 15 mL at 03/10/22 1105 ?  mometasone-formoterol (DULERA) 100-5 MCG/ACT inhaler 2 puff, 2 puff, Inhalation, BID PRN, Rolly Salter, MD, 2 puff at 03/10/22 1101 ?  OLANZapine (ZYPREXA) tablet 2.5 mg, 2.5 mg, Oral, QHS, Orin Eberwein A, 2.5 mg at 03/09/22 2150 ?  piperacillin-tazobactam (ZOSYN) IVPB 3.375 g, 3.375 g, Intravenous, Q8H, Pham, Minh Q, RPH-CPP, Last Rate: 12.5 mL/hr at 03/10/22 0624, 3.375 g at 03/10/22 5409 ?  potassium chloride SA (KLOR-CON M) CR tablet 40 mEq, 40 mEq, Oral, Once, Rolly Salter, MD ?  sodium chloride HYPERTONIC 3 % nebulizer solution 4 mL, 4 mL, Nebulization, TID PRN, Rolly Salter, MD ?  [COMPLETED] thiamine (B-1) 200 mg  in sodium chloride 0.9 % 50 mL IVPB, 200 mg, Intravenous, Daily, Last Rate: 100 mL/hr at 03/09/22 1054, 200 mg at 03/09/22 1054 **FOLLOWED BY** thiamine tablet 100 mg, 100 mg, Oral, Daily, Chavis Tessler A, 100 mg at 03/10/22 1055 ?  umeclidinium bromide (INCRUSE ELLIPTA) 62.5 MCG/ACT 1 puff, 1 puff, Inhalation, Daily, Orma Flaming, MD, 1 puff at 03/10/22 1106 ? ?Allergies: ?Allergies  ?Allergen Reactions  ? Bactrim [Sulfamethoxazole-Trimethoprim]   ?  ACUTE RENAL  fAILURE/HYPONATREMIA/HYPERKALEMIA  ? Macrodantin [Nitrofurantoin Macrocrystal] Rash  ? Ibuprofen Other (See Comments)  ?  unknown  ? Ivp Dye [Iodinated Contrast Media] Other (See Comments)  ?  unknown  ? Medrol [Methylprednisolone]   ?  Halluci

## 2022-03-10 NOTE — Progress Notes (Signed)
?Progress Note ?Patient: Claire [name] QMV:[id number] DOB: September 15, 1951 DOA: 03/06/2022  ?DOS: the patient was seen and examined on 03/10/2022 ? ?Brief hospital course: ?71 year old woman PMH including chronic hypoxic respiratory failure, COPD, cavitary MAC, PTSD, anxiety, depression presenting with confusion and shortness of breath.   ?Found to have acute encephalopathy which is multifactorial as well as sepsis secondary to pneumonia. ? ?3/23-4/1 admitted at [facility] for hyponatremia. ?02/17/22 admitted for Klonopin overdose and then transferred to behavioral health for stabilization. ?4/2 admitted to the hospital this time.  Pulmonary consulted. ?4/3 psychiatry consulted. ?4/4 palliative care consult. ?Assessment and Plan: ?* Acute metabolic encephalopathy ?Sepsis secondary to multifocal pneumonia. ?COPD without any exacerbation. ?Chronic respiratory failure with hypoxia and hypercarbia. ?Brought in from home due to concerns with confusion and shortness of breath. ?Found to have acute on chronic hypercarbic respiratory failure as well as pneumonia. ?Met SIRS criteria on admission with tachypnea and leukocytosis with evidence of infection with pneumonia. ?Reportedly delirium is actually improving in the hospital. ?Psychiatry is following ?Highly appreciate their assistance. ?Patient is actually going to be started on Zyprexa which will help significantly with her paranoia. ?Also treated with IV thiamine.  Although B1 level is normal.  No transition to p.o. thiamine. ?Suspect there is also a component of her mood disorder responsible for her delirium. ? ?COPD (chronic obstructive pulmonary disease) ([place]) ?Chronic respiratory failure with hypoxia and hypercapnia (HCC) ?Multifocal pneumonia ?Continue supplemental oxygen.  ?Bullous emphysema.  Continue bronchodilators, oxygen. ?BIPAP at night. Per pulmonology needs noninvasive ventilation at night. ?Zosyn per pulmonology, continue IV in the hospital.  Per pulmonary can switch to  fluoroquinolone To complete her therapy course of 7 to 10 days. ? ?Mycobacterium avium infection with cavitory lesion ?Followed by pulmonology and ID outpatient.  ?Tx recommended outpatient but patient was considering. ?no inpatient treatment per pulmonology  ?not a candidate as well. ? ?Protein-calorie malnutrition, severe ([place]) ?Failure to thrive ?poor oral intake ?Underweight ?Placing the patient at high risk of poor outcome. ?Body mass index is 15.11 kg/m?[name] ?Nutrition Problem: Inadequate oral intake ?Etiology:  (hallucinations) ?Nutrition Interventions: ?Interventions: Ensure Enlive (each supplement provides 350kcal and 20 grams of protein)  ? ?Anemia of chronic disease ?Baseline hgb appears to be around 8-10, has seen hematology outpatient ?Stable, continue oral iron and follow  ? ?Acquired hypothyroidism ?TSH high but free T4 WNL  ?Had not been taking medication, continue home dose for now  ? ?Hyponatremia ?mild, stable, follow. ? ?Goals of care, counseling/discussion ?Extensive discussion with daughter with regards to patient's current prognosis. ?Patient with poor reserve in the setting of severe protein calorie malnutrition and failure to thrive with underweight status in the setting of chronic pulmonary disease COPD, respiratory failure and MAC infection confounded by ongoing issues with mood disorder which includes anxiety, depression, PTSD as well as recent suicidal attempt. ?Prognosis is guarded in the setting ?Patient currently DNR on admission. ?I expect that the patient will continue to have ongoing recurrent infections due to her poor reserves and deconditioning which would lead to further change in medications. ?If the patient does not improve with SNF and change in medications with psychiatry, ideally patient would be a candidate for hospice. ?Ongoing issues with poor p.o. intake now resulting in acute kidney injury again.  We will need to monitor. ? ?Renal insufficiency not meeting AKI  criteria ?Baseline serum creatinine around 0.9. ?Serum creatinine currently 1.16 with elevation of the BUN likely in the setting of poor p.o. intake. ?This represents ongoing issues with the  patient's poor p.o. intake due to her psychiatric illness. ?Monitor for now. ? ?Subjective: No nausea no vomiting no fever no chills.  Minimal oral intake.  Reports pain in the bottom. ? ?Physical Exam: ?Vitals:  ? 03/09/22 0514 03/09/22 1700 03/09/22 1958 03/10/22 0801  ?BP: 112/67 102/64 114/64 (!) 104/55  ?Pulse: (!) 58 67 66 69  ?Resp: _0 ?Temp: (!) 97 ?F (36.1 ?C) (!) 97.5 ?F (36.4 ?C) 98 ?F (36.7 ?C) 98 ?F (36.7 ?C)  ?TempSrc: Axillary Oral Oral Oral  ?SpO2: 100% 97% 96% 96%  ?Weight:      ?Height:      ? ?General: Appear in moderate distress; no visible Abnormal Neck Mass Or lumps, Conjunctiva normal ?Cardiovascular: S1 and S2 Present, no Murmur, ?Respiratory: increased respiratory effort, Bilateral Air entry present and  bilateral  Crackles, Occasional wheezes ?Abdomen: Bowel Sound present, Non tender  ?Extremities: no Pedal edema ?Neurology: alert and oriented to time, place, and person ?Gait not checked due to patient safety concerns  ? ?Data Reviewed: ?I have Reviewed nursing notes, Vitals, and Lab results since pt's last encounter. Pertinent lab results CBC and BMP ?I have ordered test including CBC and BMP   ? ?Family Communication: Daughter at bedside ? ?Disposition: ?Status is: Inpatient ?Remains inpatient appropriate because: Ongoing issues with poor p.o. intake as well as acute renal insufficiency ? ?Author: ?Berle Mull, MD ?03/10/2022 6:35 PM ? ?For on call review [email or web address] ?

## 2022-03-10 NOTE — Progress Notes (Signed)
Physical Therapy Treatment ?Patient Details ?Name: Claire [name] ?MRN: [id number] ?DOB: 1951/01/22 ?Today's Date: 03/10/2022 ? ? ?History of Present Illness 71 y.o. female adm 03/06/22 with AMS and SOB. Pt with acute encephalopathy, awaiting brain MRI. CT of head 4/2: "hypodensity in the posterior left putamen/external capsule appears new compared to the 01/06/2022 exam but is technically age indeterminate". Admitted to psychiatry on 02/22/22 for suicide attempt and [facility] admission 3/23-4/1 with 2 falls. PMH: HTN, hypothyroidism, PTSD, anxiety, depression, COPD on 2-3L, anemia ? ?  ?PT Comments  ? ? Pt demos dec functional mobility today secondary to c/o feeling unwell/dizzy with low BP noted at rest.  Pt demos good tolerance to therex in bed with BP improving with activity.  Pt appears more oriented today and PT should continue to encourage pt to mobilize as able.  ?Recommendations for follow up therapy are one component of a multi-disciplinary discharge planning process, led by the attending physician.  Recommendations may be updated based on patient status, additional functional criteria and insurance authorization. ? ?Follow Up Recommendations ? Other (comment) (Family requesting SNF due to cognitive deficits; pt functionally only requiring min A for amb and has 24/7 care at home.) ?  ?  ?Assistance Recommended at Discharge Intermittent Supervision/Assistance  ?Patient can return home with the following A little help with walking and/or transfers;A little help with bathing/dressing/bathroom;Assistance with cooking/housework;Direct supervision/assist for financial management;Assist for transportation;Direct supervision/assist for medications management;Help with stairs or ramp for entrance ?  ?Equipment Recommendations ? Rollator (4 wheels)  ?  ?Recommendations for Other Services   ? ? ?  ?Precautions / Restrictions Precautions ?Precautions: None  ?  ? ?Mobility ? Bed Mobility ?Overal bed mobility: Needs Assistance ?   ?  ?  ?  ?  ?  ?General bed mobility comments: Pt requests assist to scoot up in bed due to discomfort in side.  Req's min A with use of chuck pad to reposition. ?Patient Response: Cooperative ? ?Transfers ?  ?  ?  ?  ?  ?  ?  ?  ?  ?  ?  ? ?Ambulation/Gait ?  ?  ?  ?  ?  ?  ?  ?  ? ? ?Stairs ?  ?  ?  ?  ?  ? ? ?Wheelchair Mobility ?  ? ?Modified Rankin (Stroke Patients Only) ?  ? ? ?  ?Balance   ?  ?  ?  ?  ?  ?  ?  ?  ?  ?  ?  ?  ?  ?  ?  ?  ?  ?  ?  ? ?  ?Cognition Arousal/Alertness: Awake/alert ?Behavior During Therapy: [facility] for tasks assessed/performed ?Overall Cognitive Status: Impaired/Different from baseline ?Area of Impairment: Memory ?  ?  ?  ?  ?  ?  ?  ?  ?  ?  ?  ?  ?  ?  ?  ?General Comments: Pt is supine in bed when PT arrives.  States she "doesn't feel well" and refuses to get OOB.  Also c/o R sided pain. BP taken and noted to be low, 83/68 with pt reports of dizziness. Of note, potassium is slightly low and pt apparently refused AM meds. Appears more oriented today and remembers working with PT yesterday, understands she's in the hospital.  Agreeable to do therex in bed. ?  ?  ? ?  ?Exercises General Exercises - Lower Extremity ?Heel Slides: AAROM, Both, 20 reps ?Hip ABduction/ADduction: AAROM, Both,  20 reps ?Straight Leg Raises: AAROM, Both, 20 reps ? ?  ?General Comments   ?  ?  ? ?Pertinent Vitals/Pain Pain Assessment ?Pain Assessment: Faces ?Faces Pain Scale: Hurts little more ?Pain Location: R side ?Pain Descriptors / Indicators: Discomfort ?Pain Intervention(s): Repositioned  ? ? ?Home Living   ?  ?  ?  ?  ?  ?  ?  ?  ?  ?   ?  ?Prior Function    ?  ?  ?   ? ?PT Goals (current goals can now be found in the care plan section) Progress towards PT goals:  (Unable to progress goals today due to generalized feeling of being unwell, dizzy, low BP, low potassium (refused AM meds).) ? ?  ?Frequency ? ? ?   ? ? ? ?  ?PT Plan Current plan remains appropriate  ? ? ?Co-evaluation   ?  ?  ?  ?  ? ?   ?AM-PAC PT "6 Clicks" Mobility   ?Outcome Measure ? Help needed turning from your back to your side while in a flat bed without using bedrails?: A Little ?Help needed moving from lying on your back to sitting on the side of a flat bed without using bedrails?: A Little ?Help needed moving to and from a bed to a chair (including a wheelchair)?: A Little ?Help needed standing up from a chair using your arms (e.g., wheelchair or bedside chair)?: A Little ?Help needed to walk in hospital room?: A Little ?Help needed climbing 3-5 steps with a railing? : A Little ?6 Click Score: 18 ? ?  ?End of Session   ?Activity Tolerance: Other (comment);Patient limited by fatigue (Limited due to generalized feelings of not feeling well and low BP) ?Patient left: in bed;with bed alarm set;with call bell/phone within reach ?Nurse Communication: Mobility status ?  ?  ? ? ?Time: 2725-3664 ?PT Time Calculation (min) (ACUTE ONLY): 15 min ? ?Charges:  $Therapeutic Exercise: 8-22 mins          ?          ?Kenta Laster A. Demaryius Imran, PT, DPT ?Acute Rehabilitation Services ?Office: [phone]  ? ? ?Nolan Lasser A Samreet [place] ?03/10/2022, 12:40 PM ? ?

## 2022-03-10 NOTE — Care Management Important Message (Signed)
Important Message ? ?Patient Details  ?Name: Claire [name] ?MRN: [id number] ?Date of Birth: 1951/07/30 ? ? ?Medicare Important Message Given:  Yes ? ? ? ? ?[name] ?03/10/2022, 9:13 AM ?

## 2022-03-10 NOTE — Assessment & Plan Note (Addendum)
Baseline serum creatinine around 0.9. ?Serum creatinine worsening to 1.3. ?Treated with IV fluids with improvement in renal function.  Will give fluid and monitor. ?Patient was encouraged to stop Dr. Samule Dry drinks/high caffeine intake. ?

## 2022-03-10 NOTE — TOC Progression Note (Signed)
Transition of Care (TOC) - Progression Note  ? ? ?Patient Details  ?Name: Claire [name] ?MRN: [id number] ?Date of Birth: Sep 30, 1951 ? ?Transition of Care (TOC) CM/SW Contact  ?Delilah Shan, LCSWA ?Phone Number: ?03/10/2022, 9:53 AM ? ?Clinical Narrative:    ? ?Patients insurance authorization is currently pending. Patient has SNF bed at [facility]. Teena with [employer] confirmed Bip[facility] ordered yesterday and should arrive today.CSW will continue to follow and assist with patients dc planning needs. ? ?Expected Discharge Plan: Skilled Nursing Facility ?Barriers to Discharge: Continued Medical Work up ? ?Expected Discharge Plan and Services ?Expected Discharge Plan: Skilled Nursing Facility ?In-house Referral: Clinical Social Work ?Discharge Planning Services: CM Consult ?Post Acute Care Choice: Durable Medical Equipment ?Living arrangements for the past 2 months: Single Family Home ?                ?DME Arranged: B[facility] ?DME Agency: [employer] ?Date DME Agency Contacted: 03/06/22 ?Time DME Agency Contacted: [phone] ?Representative spoke with at DME Agency: Leavy Cella ?  ?  ?  ?  ?  ? ? ?Social Determinants of Health (SDOH) Interventions ?  ? ?Readmission Risk Interventions ? ?  03/09/2022  ?  4:04 PM  ?Readmission Risk Prevention Plan  ?Transportation Screening Complete  ?HRI or Home Care Consult Complete  ?Social Work Consult for Recovery Care Planning/Counseling Complete  ?Palliative Care Screening Complete  ? ? ?

## 2022-03-10 NOTE — Progress Notes (Signed)
Patient continues, in addition to several prior days, to refuse all Nebulized meds and Inhalers.  Have changed them all to PRN in addition to her standing ALbuterol PRN order and instructed to patient to call if she changes her mind and would like them. ?

## 2022-03-11 ENCOUNTER — Inpatient Hospital Stay (HOSPITAL_COMMUNITY): Payer: Medicare Other

## 2022-03-11 DIAGNOSIS — K521 Toxic gastroenteritis and colitis: Secondary | ICD-10-CM | POA: Diagnosis not present

## 2022-03-11 DIAGNOSIS — G9340 Encephalopathy, unspecified: Secondary | ICD-10-CM | POA: Diagnosis present

## 2022-03-11 LAB — CULTURE, BLOOD (ROUTINE X 2)
Culture: NO GROWTH
Culture: NO GROWTH
Special Requests: ADEQUATE

## 2022-03-11 LAB — HEPATIC FUNCTION PANEL
ALT: 11 U/L (ref 0–44)
AST: 15 U/L (ref 15–41)
Albumin: 2.2 g/dL — ABNORMAL LOW (ref 3.5–5.0)
Alkaline Phosphatase: 52 U/L (ref 38–126)
Bilirubin, Direct: <0.1 mg/dL (ref 0.0–0.2)
Total Bilirubin: 0.5 mg/dL (ref 0.3–1.2)
Total Protein: 7.5 g/dL (ref 6.5–8.1)

## 2022-03-11 LAB — BASIC METABOLIC PANEL
Anion gap: 9 (ref 5–15)
BUN: 37 mg/dL — ABNORMAL HIGH (ref 8–23)
CO2: 39 mmol/L — ABNORMAL HIGH (ref 22–32)
Calcium: 9.4 mg/dL (ref 8.9–10.3)
Chloride: 89 mmol/L — ABNORMAL LOW (ref 98–111)
Creatinine, Ser: 1.33 mg/dL — ABNORMAL HIGH (ref 0.44–1.00)
GFR, Estimated: 43 mL/min — ABNORMAL LOW (ref >60)
Glucose, Bld: 113 mg/dL — ABNORMAL HIGH (ref 70–99)
Potassium: 4 mmol/L (ref 3.5–5.1)
Sodium: 137 mmol/L (ref 135–145)

## 2022-03-11 MED ORDER — LOPERAMIDE HCL 2 MG PO CAPS
2.0000 mg | ORAL_CAPSULE | Freq: Once | ORAL | Status: AC
  Administered 2022-03-11: 2 mg via ORAL
  Filled 2022-03-11: qty 1

## 2022-03-11 MED ORDER — DIPHENOXYLATE-ATROPINE 2.5-0.025 MG PO TABS
1.0000 | ORAL_TABLET | Freq: Four times a day (QID) | ORAL | Status: DC | PRN
  Administered 2022-03-11 – 2022-03-13 (×4): 1 via ORAL
  Filled 2022-03-11 (×4): qty 1

## 2022-03-11 MED ORDER — SODIUM CHLORIDE 0.9 % IV SOLN: INTRAVENOUS | Status: DC

## 2022-03-11 MED ORDER — UMECLIDINIUM BROMIDE 62.5 MCG/ACT IN AEPB
1.0000 | INHALATION_SPRAY | Freq: Every day | RESPIRATORY_TRACT | Status: DC
  Administered 2022-03-12 – 2022-03-14 (×3): 1 via RESPIRATORY_TRACT
  Filled 2022-03-11: qty 7

## 2022-03-11 MED ORDER — BUDESONIDE 0.25 MG/2ML IN SUSP: 0.2500 mg | Freq: Two times a day (BID) | RESPIRATORY_TRACT | Status: DC

## 2022-03-11 MED ORDER — MOMETASONE FURO-FORMOTEROL FUM 200-5 MCG/ACT IN AERO
2.0000 | INHALATION_SPRAY | Freq: Two times a day (BID) | RESPIRATORY_TRACT | Status: DC
  Administered 2022-03-12 – 2022-03-14 (×5): 2 via RESPIRATORY_TRACT
  Filled 2022-03-11: qty 8.8

## 2022-03-11 MED ORDER — SACCHAROMYCES BOULARDII 250 MG PO CAPS
250.0000 mg | ORAL_CAPSULE | Freq: Two times a day (BID) | ORAL | Status: DC
  Administered 2022-03-11 – 2022-03-14 (×7): 250 mg via ORAL
  Filled 2022-03-11 (×8): qty 1

## 2022-03-11 MED ORDER — GERHARDT'S BUTT CREAM
TOPICAL_CREAM | Freq: Three times a day (TID) | CUTANEOUS | Status: DC
  Filled 2022-03-11: qty 1

## 2022-03-11 MED ORDER — IPRATROPIUM-ALBUTEROL 0.5-2.5 (3) MG/3ML IN SOLN: 3.0000 mL | Freq: Two times a day (BID) | RESPIRATORY_TRACT | Status: DC

## 2022-03-11 NOTE — Progress Notes (Signed)
?Progress Note ?Patient: Claire [name] YIR:[id number] DOB: [birth year] DOA: 03/06/2022  ?DOS: the patient was seen and examined on 03/11/2022 ? ?Brief hospital course: ?71 year old woman PMH including chronic hypoxic respiratory failure, COPD, cavitary MAC, PTSD, anxiety, depression presenting with confusion and shortness of breath.   ?Found to have acute encephalopathy which is multifactorial as well as sepsis secondary to pneumonia. ? ?3/23-4/1 admitted at [facility] for hyponatremia. ?02/17/22 admitted for Klonopin overdose and then transferred to behavioral health for stabilization. ?4/2 admitted to the hospital this time.  Pulmonary consulted. ?4/3 psychiatry consulted. ?4/4 palliative care consult. ? ?Assessment and Plan: ?* Acute metabolic encephalopathy ?Sepsis secondary to multifocal pneumonia. ?COPD without any exacerbation. ?Chronic respiratory failure with hypoxia and hypercarbia. ?Brought in from home due to concerns with confusion and shortness of breath. ?Found to have acute on chronic hypercarbic respiratory failure as well as pneumonia. ?Met SIRS criteria on admission with tachypnea and leukocytosis with evidence of infection with pneumonia. ?Reportedly delirium is actually improving in the hospital. ?Psychiatry is following ?Highly appreciate their assistance. ?Patient is actually going to be started on Zyprexa which will help significantly with her paranoia. ?Also treated with IV thiamine.  Although B1 level is normal.  Now transition to p.o. thiamine. ?Suspect there is also a component of her mood disorder responsible for her delirium. ? ?COPD (chronic obstructive pulmonary disease) ([place]) ?Chronic respiratory failure with hypoxia and hypercapnia (HCC) ?Multifocal pneumonia ?Continue supplemental oxygen.  ?Bullous emphysema.  Continue bronchodilators, oxygen. ?BIPAP at night. Per pulmonology needs noninvasive ventilation at night. ?Zosyn per pulmonology, continue IV in the hospital.  Per pulmonary can switch  to fluoroquinolone To complete her therapy course of 7 to 10 days. ?Discussed with the flex respite therapist on 4/7, medication resumed. ?Also order placed to maintain oxygenation between 88 and 92% as the family is concerned with overcorrection. ? ?Mycobacterium avium infection with cavitory lesion ?Followed by pulmonology and ID outpatient.  ?Tx recommended outpatient but patient was considering. ?no inpatient treatment per pulmonology  ?not a candidate as well. ? ?Protein-calorie malnutrition, severe ([place]) ?Failure to thrive ?poor oral intake ?Underweight ?Placing the patient at high risk of poor outcome. ?Body mass index is 15.11 kg/m?[name] ?Nutrition Problem: Inadequate oral intake ?Etiology:  (hallucinations) ?Nutrition Interventions: ?Interventions: Ensure Enlive (each supplement provides 350kcal and 20 grams of protein)  ? ?Anemia of chronic disease ?Baseline hgb appears to be around 8-10, has seen hematology outpatient ?Stable, continue oral iron and follow  ? ?Acquired hypothyroidism ?TSH high but free T4 WNL  ?Had not been taking medication, continue home dose for now  ? ?Hyponatremia ?mild, stable, follow. ? ?Antibiotic-associated diarrhea ?Reports 5 BM today. ?Most likely in the setting of antibiotics use. ?X-ray abdomen unremarkable. ?Treated with Imodium without any improvement, we will use Lomotil. ?We will also provide probiotics. ? ?Goals of care, counseling/discussion ?Extensive discussion with daughter with regards to patient's current prognosis. ?Patient with poor reserve in the setting of severe protein calorie malnutrition and failure to thrive with underweight status in the setting of chronic pulmonary disease COPD, respiratory failure and MAC infection confounded by ongoing issues with mood disorder which includes anxiety, depression, PTSD as well as recent suicidal attempt. ?Prognosis is guarded in the setting ?Patient currently DNR on admission. ?I expect that the patient will continue to  have ongoing recurrent infections due to her poor reserves and deconditioning which would lead to further change in medications. ?If the patient does not improve with SNF and change in medications with psychiatry, ideally  patient would be a candidate for hospice. ?Ongoing issues with poor p.o. intake now resulting in acute kidney injury now. ?Currently back on IV fluids. ?We will monitor response. ? ?AKI (acute kidney injury) ([place]) ?Baseline serum creatinine around 0.9. ?Serum creatinine worsening to 1.3.  Will provide IV fluids. ?This represents ongoing issues with the patient's poor p.o. intake due to her psychiatric illness. ?Monitor for now. ? ?Subjective: No nausea no vomiting no fever no chills.  5 bowel movements today without any blood. ? ?Physical Exam: ?Vitals:  ? 03/10/22 0801 03/10/22 2023 03/11/22 0443 03/11/22 1242  ?BP: (!) 104/55 99/63 102/66   ?Pulse: 69 67 (!) 134   ?Resp: 15 18    ?Temp: 98 ?F (36.7 ?C) 97.7 ?F (36.5 ?C) 97.6 ?F (36.4 ?C)   ?TempSrc: Oral Oral Oral   ?SpO2: 96% 97% 94% 93%  ?Weight:      ?Height:      ? ?General: Appear in moderate distress; no visible Abnormal Neck Mass Or lumps, Conjunctiva normal ?Cardiovascular: S1 and S2 Present, no Murmur, ?Respiratory: increased respiratory effort, Bilateral Air entry present and  bilateral  Crackles, bilateral  wheezes ?Abdomen: Bowel Sound present, Non tender  ?Extremities: no Pedal edema ?Neurology: alert and oriented to time, place, and person ?Gait not checked due to patient safety concerns  ? ?Data Reviewed: ?I have Reviewed nursing notes, Vitals, and Lab results since pt's last encounter. Pertinent lab results CBC and BMP ?I have ordered test including CBC and BMP ?I have discussed pt's care plan and test results with psychiatric.  ? ?Family Communication: Daughter at bedside ? ?Disposition: ?Status is: Inpatient ?Remains inpatient appropriate because: Ongoing issues with poor p.o. intake resulting in acute kidney injury requiring IV  fluids. ? ?Author: ?Berle Mull, MD ?03/11/2022 7:24 PM ? ?For on call review [email or web address] ?

## 2022-03-11 NOTE — Consult Note (Signed)
[facility] ? ? ?Service Date: March 11, 2022 ?LOS:  LOS: 4 days  ? ? ?Assessment  ?Claire [name] is a 71 y.o. female admitted medically for 03/06/2022  9:46 AM for delirium and failure to thrive. She carries the psychiatric diagnoses of depression and anxiety and has a past medical history below. She has a recent suicide attempt.Psychiatry was consulted for delirium and recent suicide attempt by Orland MustardAllison Wolfe, MD.  ? ?At this time, the patient's presentation is most consistent with hypoactive delirium, most likely due to multiple etiologies including but not limited to infection, medications, pain, altered sleep/wake cycle, and limited mobility. The patient would strongly benefit from medical treatment of chronic hypercapnic respiratory failure, possible CAP, cachexia, hyponatrimia possible UTI as well as further investigation for etiologies of delirium. Although she has a recent suicide attempt, she does not remember this (verified with daughter at bedside); she does endorse passive SI on exam but denies any intent or plan and cites her religion as a protective factor. During this time period, minimization of delirogenic insults will be of utmost importance; this includes promoting the normal circadian cycle, minimizing lines/tubes, avoiding deliriogenic medications such as benzodiazepines and anticholinergic medications, and frequently reorienting the patient. Symptomatic treatment for agitation can be provided by antipsychotic medications, though it is important to remember that these do not treat the underlying etiology of delirium. Notably, there can be a time lag effect between treatment of a medical problem and resolution of delirium. This time lag effect may be of longer duration in the elderly, and those with underlying cognitive impairment or brain injury. ? ?4/4: Pt with significantly improved attention, organization, etc. Ongoing paranoia towards  daughter + hospital staff although this is the first time I have evaluated her without family present. Declined antipsychotics citing prior bad s/e. Delirium improved likely 2/2 thiamine and/or pulmonary intervention.  ? ?4/5: Continued improvement in attention and thought organization. Ongoing paranoia although pt makes efforts to hide this (indicating improving insight). Now agreeable to trial olanzapine 2.5 mg, had previously [facility]. Discussed r/b/se including hypotension; did tell pt it was an antipsychotic.  ? ?4/6: Pt more agitated, confused although saw in AM instead of PM with no family at bedside. ? ?4/6: Moderate interval improvement, less paranoid towards myself. Ongoing poor appetite.   ? ?Diagnoses:  ?Active Hospital problems: ?Principal Problem: ?  Acute metabolic encephalopathy ?Active Problems: ?  Acquired hypothyroidism ?  Hyponatremia ?  Anemia of chronic disease ?  Multifocal pneumonia ?  Renal insufficiency not meeting AKI criteria ?  Chronic respiratory failure with hypoxia and hypercapnia (HCC) ?  Protein-calorie malnutrition, severe (HCC) ?  Mycobacterium avium infection with cavitory lesion ?  Anxiety and depression/PTSD/recent SI attempt ?  COPD (chronic obstructive pulmonary disease) (HCC) ?  Goals of care, counseling/discussion ?  ? ? ?Plan  ?## Safety and Observation Level:  ?- Based on my clinical evaluation, I estimate the patient to be at low/moderate risk of self harm in the current setting largely due to delirium ?- At this time, we recommend a routine level of observation; would have low threshold to escalate given recovering metabolic encephalopathy  ? ? ?## Medications:  ?-- c clonazepam 0.25 mg BID (decreased recently, risk of further decrease > benefit) ?- continue to hold wellbutrin (can be deliriogenic) ? ? ?- c olanzapine 2.5 mg QHS  ?- now on oral thiamine ? ?## Medical Decision Making Capacity:  ?  Not formally assessed ? ?## Further Work-up:   ?-- per primary ? ?-- While receiving olanzapine, please monitor and replete K+ to 4 and Mg2+ to 2 ? ?-- most recent EKG on 4/2 had QtC of 443 ? ?## Disposition:  ?-- per primary ? ? ?Thank you for this consult request. Recommendations have been communicated to the primary team.  We will continue to follow at this time.  ? ?Claris Che A Sheetal Lyall ? ? ?New history  ?Relevant Aspects of Hospital Course:  ?Admitted on 03/06/2022 for encephalopathy. ? ?Patient Report:  ?Patient seen in AM initially alone; daughter and grandson show up partway through evaluation. Is lying in bed covered in blankets, complaining of stomach hurting. Ate a couple bites of breakfast but wasn't able to keep milk down because not lactose free. She endorses getting up to chair with nursing staff earlier today which tired her out. Denies SI, HI, AH/VH - no visions of redness or blood coming out of my mask today.  ? ?ROS:  ?Incorporated in the HPI ? ?Collateral information:  ?Daughter at bedside continues to have concerns about BIPAP refusal.  ? ?Psychiatric/social/family/medical history /allergies  ?See initial consult note. ? ?Medications:  ? ?Current Facility-Administered Medications:  ?  0.9 %  sodium chloride infusion, , Intravenous, Continuous, Rolly Salter, MD ?  acetaminophen (TYLENOL) tablet 1,000 mg, 1,000 mg, Oral, TID, Shaffer, Luvenia Redden, NP, 1,000 mg at 03/11/22 6812 ?  albuterol (PROVENTIL) (2.5 MG/3ML) 0.083% nebulizer solution 2.5 mg, 2.5 mg, Inhalation, Q2H PRN, Orland Mustard, MD ?  budesonide (PULMICORT) nebulizer solution 0.25 mg, 0.25 mg, Nebulization, BID, Rolly Salter, MD ?  clonazePAM Scarlette Calico) disintegrating tablet 0.25 mg, 0.25 mg, Oral, TID, Orland Mustard, MD, 0.25 mg at 03/11/22 7517 ?  dicyclomine (BENTYL) capsule 10 mg, 10 mg, Oral, TID AC, Standley Brooking, MD, 10 mg at 03/11/22 1241 ?  enoxaparin (LOVENOX) injection 30 mg, 30 mg, Subcutaneous, Q24H, Rolly Salter, MD, 30 mg at 03/10/22 1826 ?  famotidine  (PEPCID) tablet 20 mg, 20 mg, Oral, Daily PRN, Orland Mustard, MD ?  feeding supplement (ENSURE ENLIVE / ENSURE PLUS) liquid 237 mL, 237 mL, Oral, TID BM, Dahal, Binaya, MD, 237 mL at 03/11/22 0929 ?  ferrous sulfate tablet 325 mg, 325 mg, Oral, Q breakfast, Orland Mustard, MD, 325 mg at 03/11/22 0017 ?  haloperidol (HALDOL) tablet 0.5 mg, 0.5 mg, Oral, Q8H PRN **OR** haloperidol lactate (HALDOL) injection 0.5 mg, 0.5 mg, Intramuscular, Q8H PRN, Jaslynn Thome A ?  ipratropium-albuterol (DUONEB) 0.5-2.5 (3) MG/3ML nebulizer solution 3 mL, 3 mL, Nebulization, BID, Rolly Salter, MD ?  levothyroxine (SYNTHROID) tablet 75 mcg, 75 mcg, Oral, Daily, Orland Mustard, MD, 75 mcg at 03/11/22 4944 ?  MEDLINE mouth rinse, 15 mL, Mouth Rinse, BID, Rolly Salter, MD, 15 mL at 03/10/22 1105 ?  mometasone-formoterol (DULERA) 100-5 MCG/ACT inhaler 2 puff, 2 puff, Inhalation, BID PRN, Rolly Salter, MD, 2 puff at 03/10/22 1101 ?  OLANZapine (ZYPREXA) tablet 2.5 mg, 2.5 mg, Oral, QHS, Tevon Berhane A, 2.5 mg at 03/10/22 2222 ?  piperacillin-tazobactam (ZOSYN) IVPB 3.375 g, 3.375 g, Intravenous, Q8H, Pham, Minh Q, RPH-CPP, Last Rate: 12.5 mL/hr at 03/11/22 0616, 3.375 g at 03/11/22 0616 ?  sodium chloride HYPERTONIC 3 % nebulizer solution 4 mL, 4 mL, Nebulization, TID PRN, Rolly Salter, MD ?  [COMPLETED] thiamine (B-1) 200 mg in sodium chloride 0.9 % 50 mL IVPB, 200 mg, Intravenous, Daily, Last Rate: 100 mL/hr at  03/09/22 1054, 200 mg at 03/09/22 1054 **FOLLOWED BY** thiamine tablet 100 mg, 100 mg, Oral, Daily, Davette Nugent A, 100 mg at 03/11/22 3716 ? ?Allergies: ?Allergies  ?Allergen Reactions  ? Bactrim [Sulfamethoxazole-Trimethoprim]   ?  ACUTE RENAL fAILURE/HYPONATREMIA/HYPERKALEMIA  ? Macrodantin [Nitrofurantoin Macrocrystal] Rash  ? Ibuprofen Other (See Comments)  ?  unknown  ? Ivp Dye [Iodinated Contrast Media] Other (See Comments)  ?  unknown  ? Medrol [Methylprednisolone]   ?  Hallucinations  ?  Trimethoprim   ?  Fatigue/weakness/no appetite  ? Zithromax [Azithromycin]   ?  diarrhea  ? ? ? ?  ?Objective  ?Vital signs:  ?Temp:  [97.6 ?F (36.4 ?C)-97.7 ?F (36.5 ?C)] 97.6 ?F (36.4 ?C) (04/07 9678

## 2022-03-11 NOTE — TOC Progression Note (Addendum)
Transition of Care (TOC) - Progression Note  ? ? ?Patient Details  ?Name: Claire [name] ?MRN: [id number] ?Date of Birth: 1951/02/23 ? ?Transition of Care (TOC) CM/SW Contact  ?Delilah Shan, LCSWA ?Phone Number: ?03/11/2022, 9:57 AM ? ?Clinical Narrative:    ? ?Patients insurance authorization has been approved. Plan auth ID# [id number]. [facility] # [phone]. Start date is for 4/6-4/10.CSW informed MD. Patient has SNF bed at [facility] when medically ready. ? ?Patients insurance authorization is pending. Patient has SNF bed at [facility]. CSW will continue to follow and assist with patients dc planning needs. ? ?Expected Discharge Plan: Skilled Nursing Facility ?Barriers to Discharge: Continued Medical Work up ? ?Expected Discharge Plan and Services ?Expected Discharge Plan: Skilled Nursing Facility ?In-house Referral: Clinical Social Work ?Discharge Planning Services: CM Consult ?Post Acute Care Choice: Durable Medical Equipment ?Living arrangements for the past 2 months: Single Family Home ?                ?DME Arranged: Bipap ?DME Agency: [employer] ?Date DME Agency Contacted: 03/06/22 ?Time DME Agency Contacted: 1637 ?Representative spoke with at DME Agency: Leavy Cella ?  ?  ?  ?  ?  ? ? ?Social Determinants of Health (SDOH) Interventions ?  ? ?Readmission Risk Interventions ? ?  03/09/2022  ?  4:04 PM  ?Readmission Risk Prevention Plan  ?Transportation Screening Complete  ?HRI or Home Care Consult Complete  ?Social Work Consult for Recovery Care Planning/Counseling Complete  ?Palliative Care Screening Complete  ? ? ?

## 2022-03-11 NOTE — Assessment & Plan Note (Addendum)
Diarrhea improving with the use of Lomotil.  Patient is also on budesonide at home which we will resume. ?Most likely in the setting of antibiotics use. ?X-ray abdomen unremarkable. ?

## 2022-03-12 LAB — BASIC METABOLIC PANEL
Anion gap: 4 — ABNORMAL LOW (ref 5–15)
BUN: 36 mg/dL — ABNORMAL HIGH (ref 8–23)
CO2: 43 mmol/L — ABNORMAL HIGH (ref 22–32)
Calcium: 8.9 mg/dL (ref 8.9–10.3)
Chloride: 90 mmol/L — ABNORMAL LOW (ref 98–111)
Creatinine, Ser: 1.26 mg/dL — ABNORMAL HIGH (ref 0.44–1.00)
GFR, Estimated: 46 mL/min — ABNORMAL LOW (ref >60)
Glucose, Bld: 78 mg/dL (ref 70–99)
Potassium: 3.5 mmol/L (ref 3.5–5.1)
Sodium: 137 mmol/L (ref 135–145)

## 2022-03-12 LAB — CBC
HCT: 25.5 % — ABNORMAL LOW (ref 36.0–46.0)
Hemoglobin: 7.8 g/dL — ABNORMAL LOW (ref 12.0–15.0)
MCH: 31.3 pg (ref 26.0–34.0)
MCHC: 30.6 g/dL (ref 30.0–36.0)
MCV: 102.4 fL — ABNORMAL HIGH (ref 80.0–100.0)
Platelets: 297 10*3/uL (ref 150–400)
RBC: 2.49 MIL/uL — ABNORMAL LOW (ref 3.87–5.11)
RDW: 14.3 % (ref 11.5–15.5)
WBC: 9.6 10*3/uL (ref 4.0–10.5)
nRBC: 0 % (ref 0.0–0.2)

## 2022-03-12 LAB — MAGNESIUM: Magnesium: 1.6 mg/dL — ABNORMAL LOW (ref 1.7–2.4)

## 2022-03-12 MED ORDER — MAGNESIUM SULFATE 2 GM/50ML IV SOLN
2.0000 g | Freq: Once | INTRAVENOUS | Status: AC
  Administered 2022-03-12: 2 g via INTRAVENOUS
  Filled 2022-03-12: qty 50

## 2022-03-12 NOTE — Progress Notes (Signed)
?Progress Note ?Patient: Claire [name] VHQ:[id number] DOB: [birth year] DOA: 03/06/2022  ?DOS: the patient was seen and examined on 03/12/2022 ? ?Brief hospital course: ?70 year old woman PMH including chronic hypoxic respiratory failure, COPD, cavitary MAC, PTSD, anxiety, depression presenting with confusion and shortness of breath.   ?Found to have acute encephalopathy which is multifactorial as well as sepsis secondary to pneumonia. ? ?3/23-4/1 admitted at [facility] for hyponatremia. ?02/17/22 admitted for Klonopin overdose and then transferred to behavioral health for stabilization. ?4/2 admitted to the hospital this time.  Pulmonary consulted. ?4/3 psychiatry consulted. ?4/4 palliative care consult. ? ?Assessment and Plan: ?* Acute metabolic encephalopathy ?Sepsis secondary to multifocal pneumonia. ?COPD without any exacerbation. ?Chronic respiratory failure with hypoxia and hypercarbia. ?Brought in from home due to concerns with confusion and shortness of breath. ?Found to have acute on chronic hypercarbic respiratory failure as well as pneumonia. ?Met SIRS criteria on admission with tachypnea and leukocytosis with evidence of infection with pneumonia. ?Reportedly delirium is actually improving in the hospital. ?Psychiatry is following ?Highly appreciate their assistance. ?Patient is actually going to be started on Zyprexa which will help significantly with her paranoia. ?Also treated with IV thiamine.  Although B1 level is normal.  Now transition to p.o. thiamine. ?Suspect there is also a component of her mood disorder responsible for her delirium. ? ?COPD (chronic obstructive pulmonary disease) ([place]) ?Chronic respiratory failure with hypoxia and hypercapnia (HCC) ?Multifocal pneumonia ?Continue supplemental oxygen.  ?Bullous emphysema.  Continue bronchodilators, oxygen. ?BIPAP at night. Per pulmonology needs noninvasive ventilation at night. ?Zosyn per pulmonology, continue IV in the hospital.  Per pulmonary can switch  to fluoroquinolone To complete her therapy course of 7 to 10 days. ?Discussed with the respiratory therapist on 4/7, medication resumed. ?Also order placed to maintain oxygenation between 88 and 92% as the family is concerned with overcorrection. ? ?Mycobacterium avium infection with cavitory lesion ?Followed by pulmonology and ID outpatient.  ?Tx recommended outpatient but patient was considering. ?no inpatient treatment per pulmonology  ?not a candidate as well. ? ?Protein-calorie malnutrition, severe ([place]) ?Failure to thrive ?poor oral intake ?Underweight ?Placing the patient at high risk of poor outcome. ?Body mass index is 15.11 kg/m?[name] ?Nutrition Problem: Inadequate oral intake ?Etiology:  (hallucinations) ?Nutrition Interventions: ?Interventions: Ensure Enlive (each supplement provides 350kcal and 20 grams of protein)  ? ?Anemia of chronic disease ?Baseline hgb appears to be around 8-10, has seen hematology outpatient ?Stable, continue oral iron and follow  ? ?Acquired hypothyroidism ?TSH high but free T4 WNL  ?Had not been taking medication, continue home dose for now  ? ?Hyponatremia ?mild, stable, follow. ? ?Antibiotic-associated diarrhea ?Reports 5 BM today. ?Most likely in the setting of antibiotics use. ?X-ray abdomen unremarkable. ?Treated with Imodium without any improvement, we will use Lomotil. ?We will also provide probiotics. ? ?Goals of care, counseling/discussion ?Extensive discussion with daughter with regards to patient's current prognosis. ?Patient with poor reserve in the setting of severe protein calorie malnutrition and failure to thrive with underweight status in the setting of chronic pulmonary disease COPD, respiratory failure and MAC infection confounded by ongoing issues with mood disorder which includes anxiety, depression, PTSD as well as recent suicidal attempt. ?Prognosis is guarded in the setting ?Patient currently DNR on admission. ?I expect that the patient will continue to have  ongoing recurrent infections due to her poor reserves and deconditioning which would lead to further change in medications. ?If the patient does not improve with SNF and change in medications with psychiatry, ideally patient  would be a candidate for hospice. ?Ongoing issues with poor p.o. intake now resulting in acute kidney injury now. ?Currently back on IV fluids. ?We will monitor response. ? ?AKI (acute kidney injury) ([place]) ?Baseline serum creatinine around 0.9. ?Creatinine worsens to 1.3. ?Currently continuing on IV fluids.  Monitor. ?This represents ongoing issues with the patient's poor p.o. intake due to her psychiatric illness. ?Monitor for now. ? ?Subjective: Diarrhea improving.  No nausea no vomiting.  Oral intake stable but still minimal.  No fever no chills. ? ?Physical Exam: ?Vitals:  ? 03/11/22 2050 03/12/22 0845 03/12/22 1402 03/12/22 2037  ?BP: (!) 105/59  122/61 136/70  ?Pulse: 67  81 91  ?Resp: _0 ?Temp: (!) 97.3 ?F (36.3 ?C)  98 ?F (36.7 ?C) 97.8 ?F (36.6 ?C)  ?TempSrc: Oral  Oral Oral  ?SpO2: 99% 93% 98% 96%  ?Weight:      ?Height:      ? ?General: Appear in mild distress, no Rash; Oral Mucosa Clear, moist. no Abnormal Neck Mass Or lumps, Conjunctiva normal  ?Cardiovascular: S1 and S2 Present, no Murmur, ?Respiratory: Increased respiratory effort, Bilateral Air entry present and bilateral Crackles, no wheezes ?Abdomen: Bowel Sound present, Soft and no tenderness ?Extremities: no Pedal edema ?Neurology: alert and oriented to time, place, and person ?affect appropriate. no new focal deficit ?Gait not checked due to patient safety concerns  ? ?Data Reviewed: ?I have Reviewed nursing notes, Vitals, and Lab results since pt's last encounter. Pertinent lab results CBC and BMP ?I have ordered test including CBC and BMP    ? ?Family Communication: None at bedside ? ?Disposition: ?Status is: Inpatient ?Remains inpatient appropriate because: Ongoing issues with poor p.o. intake resulting in acute  kidney injury requiring IV fluids. ? ?Author: ?Berle Mull, MD ?03/12/2022 8:43 PM ? ?For on call review [email or web address]. ?

## 2022-03-12 NOTE — Progress Notes (Signed)
Occupational Therapy Treatment ?Patient Details ?Name: Claire [name] ?MRN: [id number] ?DOB: Jan 30, 1951 ?Today's Date: 03/12/2022 ? ? ?History of present illness 71 y.o. female adm 03/06/22 with AMS and SOB. Pt with acute encephalopathy, awaiting brain MRI. CT of head 4/2: "hypodensity in the posterior left putamen/external capsule appears new compared to the 01/06/2022 exam but is technically age indeterminate". Admitted to psychiatry on 02/22/22 for suicide attempt and [facility] admission 3/23-4/1 with 2 falls. PMH: HTN, hypothyroidism, PTSD, anxiety, depression, COPD on 2-3L, anemia ?  ?OT comments ? Pt is supine in bed when OT arrives.  States she "doesn't feel well" but could not elaborate and refuses to get OOB initially then reluctantlyy agrees to sit EOB, sit - stand but refuses to sit in recliner. Pt sat EOB for washing face and hands, sipping ginger ale. OT will continue to follow acutely to maximize level of function and safety  ? ?Recommendations for follow up therapy are one component of a multi-disciplinary discharge planning process, led by the attending physician.  Recommendations may be updated based on patient status, additional functional criteria and insurance authorization. ?   ?Follow Up Recommendations ? Skilled nursing-short term rehab (<3 hours/day)  ?  ?Assistance Recommended at Discharge Frequent or constant Supervision/Assistance  ?Patient can return home with the following ? Assistance with cooking/housework;Direct supervision/assist for medications management;Direct supervision/assist for financial management;Assist for transportation;Help with stairs or ramp for entrance;A little help with bathing/dressing/bathroom;A little help with walking and/or transfers ?  ?Equipment Recommendations ?  (TBD at next venue of care)  ?  ?Recommendations for Other Services   ? ?  ?Precautions / Restrictions Precautions ?Precautions: None ?Restrictions ?Weight Bearing Restrictions: No  ? ? ?  ? ?Mobility Bed  Mobility ?Overal bed mobility: Needs Assistance ?Bed Mobility: Supine to Sit, Sit to Supine ?  ?  ?Supine to sit: Min guard ?Sit to supine: Min guard ?  ?General bed mobility comments: pt reluctantly  agreed to sit EOB ?  ? ?Transfers ?Overall transfer level: Needs assistance ?  ?Transfers: Sit to/from Stand ?Sit to Stand: Min guard ?  ?  ?  ?  ?  ?  ?  ?  ?Balance Overall balance assessment: Modified Independent ?Sitting-balance support: No upper extremity supported, Single extremity supported, Feet supported ?Sitting balance-Leahy Scale: Good ?  ?  ?Standing balance support: Single extremity supported ?Standing balance-Leahy Scale: Fair ?Standing balance comment: single UE support in standing ?  ?  ?  ?  ?  ?  ?  ?  ?  ?  ?  ?   ? ?ADL either performed or assessed with clinical judgement  ? ?ADL Overall ADL's : Needs assistance/impaired ?  ?Eating/Feeding Details (indicate cue type and reason): pt refusing to eat her lunch tray and stating that her daughter is going to come visit and bring her a sandwich ?Grooming: Dance movement psychotherapist;Wash/dry hands;Min guard ?  ?  ?  ?  ?  ?Upper Body Dressing : Min guard;Sitting ?Upper Body Dressing Details (indicate cue type and reason): doffs dirty gown and dons clean one seated EOB ?Lower Body Dressing: Minimal assistance ?Lower Body Dressing Details (indicate cue type and reason): dons socks seated EOB ?  ?  ?  ?  ?  ?  ?  ?General ADL Comments: pt refused combing hair and getting into recliner to eat lunch. Sipped ginger ale seated EOB ?  ? ?Extremity/Trunk Assessment Upper Extremity Assessment ?Upper Extremity Assessment: Generalized weakness ?  ?Lower Extremity Assessment ?Lower Extremity Assessment:  Defer to PT evaluation ?  ?Cervical / Trunk Assessment ?Cervical / Trunk Assessment: Kyphotic ?  ? ?Vision Patient Visual Report: No change from baseline ?  ?  ?Perception   ?  ?Praxis   ?  ? ?Cognition Arousal/Alertness: Awake/alert ?Behavior During Therapy: [facility] for tasks  assessed/performed ?Overall Cognitive Status: Impaired/Different from baseline ?Area of Impairment: Memory, Safety/judgement, Problem solving ?  ?  ?  ?  ?  ?  ?  ?  ?  ?  ?Memory: Decreased short-term memory ?Following Commands: Follows one step commands consistently ?Safety/Judgement: Decreased awareness of deficits, Decreased awareness of safety ?  ?  ?General Comments: Pt is supine in bed when OT arrives.  States she "doesn't feel well" but could not elaborate and refuses to get OOB initially then reluctantlyy agrees to sit EOB, sit - stand but refuses to sit in recliner ?  ?  ?   ?Exercises   ? ?  ?Shoulder Instructions   ? ? ?  ?General Comments    ? ? ?Pertinent Vitals/ Pain       Pain Assessment ?Pain Assessment: No/denies pain ?Pain Score: 0-No pain ?Pain Intervention(s): Monitored during session, Repositioned ? ?Home Living   ?  ?  ?  ?  ?  ?  ?  ?  ?  ?  ?  ?  ?  ?  ?  ?  ?  ?  ? ?  ?Prior Functioning/Environment    ?  ?  ?  ?   ? ?Frequency ? Min 2X/week  ? ? ? ? ?  ?Progress Toward Goals ? ?OT Goals(current goals can now be found in the care plan section) ? Progress towards OT goals: OT to reassess next treatment ? ?   ?Plan Discharge plan remains appropriate;Frequency remains appropriate   ? ?Co-evaluation ? ? ?   ?  ?  ?  ?  ? ?  ?AM-PAC OT "6 Clicks" Daily Activity     ?Outcome Measure ? ? Help from another person eating meals?: None ?Help from another person taking care of personal grooming?: A Little ?Help from another person toileting, which includes using toliet, bedpan, or urinal?: Total ?Help from another person bathing (including washing, rinsing, drying)?: Total ?Help from another person to put on and taking off regular upper body clothing?: A Little ?Help from another person to put on and taking off regular lower body clothing?: Total ?6 Click Score: 13 ? ?  ?End of Session Equipment Utilized During Treatment: Gait belt ? ?OT Visit Diagnosis: Unsteadiness on feet (R26.81);Other abnormalities  of gait and mobility (R26.89);Muscle weakness (generalized) (M62.81);Other symptoms and signs involving cognitive function ?  ?Activity Tolerance Patient limited by fatigue;Patient limited by lethargy ?  ?Patient Left in bed;with call bell/phone within reach;with bed alarm set;with nursing/sitter in room ?  ?Nurse Communication   ?  ? ?   ? ?Time: [id number] ?OT Time Calculation (min): 18 min ? ?Charges: OT Treatments ?$Self Care/Home Management : 8-22 mins ? ? ? ?Britt Bottom ?03/12/2022, 2:27 PM ?

## 2022-03-12 NOTE — Progress Notes (Signed)
RT spoke with patient about wearing BIPAP tonight. RT explained what BIPAP is to the patient and patient stated she would like to try it. RT returned with BIPAP setup at this time and patient was asleep. RT woke patient up x2 and patient refused to wear BIPAP both times. BIPAP is set up at bedside. RT instructed patient to have RN call RT if she changes her mind. RT will monitor as needed. ?

## 2022-03-13 ENCOUNTER — Encounter (HOSPITAL_COMMUNITY): Payer: Self-pay | Admitting: Family Medicine

## 2022-03-13 DIAGNOSIS — I10 Essential (primary) hypertension: Secondary | ICD-10-CM

## 2022-03-13 LAB — MAGNESIUM: Magnesium: 1.8 mg/dL (ref 1.7–2.4)

## 2022-03-13 LAB — BASIC METABOLIC PANEL
Anion gap: 8 (ref 5–15)
BUN: 19 mg/dL (ref 8–23)
CO2: 35 mmol/L — ABNORMAL HIGH (ref 22–32)
Calcium: 8.4 mg/dL — ABNORMAL LOW (ref 8.9–10.3)
Chloride: 93 mmol/L — ABNORMAL LOW (ref 98–111)
Creatinine, Ser: 1.12 mg/dL — ABNORMAL HIGH (ref 0.44–1.00)
GFR, Estimated: 53 mL/min — ABNORMAL LOW (ref >60)
Glucose, Bld: 95 mg/dL (ref 70–99)
Potassium: 3.5 mmol/L (ref 3.5–5.1)
Sodium: 136 mmol/L (ref 135–145)

## 2022-03-13 MED ORDER — METOPROLOL TARTRATE 12.5 MG HALF TABLET
12.5000 mg | ORAL_TABLET | Freq: Two times a day (BID) | ORAL | Status: DC
  Administered 2022-03-13 (×2): 12.5 mg via ORAL
  Filled 2022-03-13 (×2): qty 1

## 2022-03-13 MED ORDER — METOPROLOL TARTRATE 5 MG/5ML IV SOLN
5.0000 mg | Freq: Four times a day (QID) | INTRAVENOUS | Status: DC
  Administered 2022-03-13: 5 mg via INTRAVENOUS
  Filled 2022-03-13: qty 5

## 2022-03-13 MED ORDER — BUDESONIDE 3 MG PO CPEP
9.0000 mg | ORAL_CAPSULE | Freq: Every day | ORAL | Status: DC
  Administered 2022-03-13: 9 mg via ORAL
  Filled 2022-03-13: qty 3

## 2022-03-13 NOTE — Progress Notes (Signed)
Patient refused BIPAP for tonight. RT instructed pt to have RT called if she changes her mind. RT will monitor as needed. ?

## 2022-03-13 NOTE — Assessment & Plan Note (Signed)
Sepsis secondary to multifocal pneumonia. ?COPD without any exacerbation. ?Chronic respiratory failure with hypoxia and hypercarbia. ?Brought in from home due to concerns with confusion and shortness of breath. ?Found to have acute on chronic hypercarbic respiratory failure as well as pneumonia. ?Met SIRS criteria on admission with tachypnea and leukocytosis with evidence of infection with pneumonia. ?Patient recommended to continue to use BiPAP nightly. ?

## 2022-03-13 NOTE — Progress Notes (Signed)
HOSPITAL MEDICINE OVERNIGHT EVENT NOTE   ? ?Nursing notified me that patient has been increasingly tachycardic with heart rates occasionally reaching the 140s. ? ?Telemetry strips reviewed, patient mostly seems to be in sinus tachycardia with frequent PVCs at times.  No evidence of atrial fibrillation or other forms of SVT. ? ?Went to briefly see the patient at bedside.  Patient is visibly anxious but denies chest pain or shortness of breath.  Mild bibasilar rales on examination without significant wheezing.  Patient notably tachycardic but heart rate now approximately 110 bpm and regular. ? ?Upon further questioning it turns out the patient refused her BiPAP yesterday evening and is somewhat anxious at this time which may be the culprit for the patient's tachycardia. ? ?We will go ahead and obtain around electrolytes with this morning's labs which can be replaced as necessary.  We will continue to monitor on telemetry. ? ?Marinda Elk  MD ?[facility]  ? ? ? ? ? ? ? ? ? ? ?

## 2022-03-13 NOTE — Progress Notes (Signed)
?Progress Note ?Patient: Claire [name] MRN:[id number] DOB: 07/12/1951 DOA: 03/06/2022  ?DOS: the patient was seen and examined on 03/13/2022 ? ?Brief hospital course: ?71-year-old woman PMH including chronic hypoxic respiratory failure, COPD, cavitary MAC, PTSD, anxiety, depression presenting with confusion and shortness of breath.   ?Found to have acute encephalopathy which is multifactorial as well as sepsis secondary to pneumonia. ? ?3/23-4/1 admitted at [facility] for hyponatremia. ?02/17/22 admitted for Klonopin overdose and then transferred to behavioral health for stabilization. ?4/2 admitted to the hospital this time.  Pulmonary consulted. ?4/3 psychiatry consulted. ?4/4 palliative care consult. ? ? ?Assessment and Plan: ?* Acute metabolic encephalopathy ?Presents with confusion.  Delirium most likely in the setting of sepsis and acute flareup of her chronic mood disorder. ?Delirium is actually improving in the hospital. ?Psychiatry is following Highly appreciate their assistance. ?Continue Zyprexa to help significantly with her paranoia. ?Treated with IV thiamine. ? ?AKI (acute kidney injury) (HCC) ?Baseline serum creatinine around 0.9. ?Serum creatinine worsening to 1.3. ?Treated with IV fluids with improvement in renal function.  Will give fluid and monitor. ?Patient was encouraged to stop Dr. Pepper/sugary drinks/high caffeine intake. ? ?COPD (chronic obstructive pulmonary disease) (HCC) ?Chronic respiratory failure with hypoxia and hypercapnia (HCC) ?Multifocal pneumonia ?Continue supplemental oxygen.  ?Bullous emphysema.  Continue bronchodilators, oxygen. ?BIPAP at night. Per pulmonology needs noninvasive ventilation at night. ?Zosyn per pulmonology, continue IV in the hospital.  Per pulmonary can switch to fluoroquinolone To complete her therapy course of 7 to 10 days. ?Discussed with the flex respite therapist on 4/7, medication resumed. ?Also order placed to maintain oxygenation between 88 and 92% as the  family is concerned with overcorrection. ? ?Multifocal pneumonia ?Sepsis secondary to multifocal pneumonia. ?COPD without any exacerbation. ?Chronic respiratory failure with hypoxia and hypercarbia. ?Brought in from home due to concerns with confusion and shortness of breath. ?Found to have acute on chronic hypercarbic respiratory failure as well as pneumonia. ?Met SIRS criteria on admission with tachypnea and leukocytosis with evidence of infection with pneumonia. ?Patient recommended to continue to use BiPAP nightly. ? ?Mycobacterium avium infection with cavitory lesion ?Followed by pulmonology and ID outpatient.  ?Tx recommended outpatient, although at present pulmonary things with the patient would not be a candidate for initiation of the therapy. ? ?Protein-calorie malnutrition, severe (HCC) ?Failure to thrive ?poor oral intake ?Underweight ?Placing the patient at high risk of poor outcome. ?Body mass index is 15.11 kg/m?. ?Nutrition Problem: Inadequate oral intake ?Etiology:  (hallucinations) ?Nutrition Interventions: ?Interventions: Ensure Enlive (each supplement provides 350kcal and 20 grams of protein)  ? ?Anemia of chronic disease ?Baseline hgb appears to be around 8-10, has seen hematology outpatient ?Stable, continue oral iron and follow  ? ?Acquired hypothyroidism ?TSH high but free T4 WNL  ?Had not been taking medication, continue home dose for now  ? ?Hyponatremia ?mild, stable, follow. ? ?Essential hypertension ?Patient chronically on atenolol 25 mg twice daily. ?Due to poor p.o. intake and resultant hypotension this medication was on hold. ?We will resume now that the blood pressure is improved but instead of atenolol we will use metoprolol 12.5 mg twice daily. ? ?Antibiotic-associated diarrhea ?Diarrhea improving with the use of Lomotil.  Patient is also on budesonide at home which we will resume. ?Most likely in the setting of antibiotics use. ?X-ray abdomen unremarkable. ? ?Goals of care,  counseling/discussion ?Extensive discussion with daughter with regards to patient's current prognosis. ?Patient with poor reserve in the setting of severe protein calorie malnutrition and failure to thrive with   underweight status in the setting of chronic pulmonary disease COPD, respiratory failure and MAC infection confounded by ongoing issues with mood disorder which includes anxiety, depression, PTSD as well as recent suicidal attempt. ?Prognosis is guarded in the setting ?Patient currently DNR on admission. ?I expect that the patient will continue to have ongoing recurrent infections due to her poor reserves and deconditioning which would lead to further change in medications. ?If the patient does not improve with SNF and change in medications with psychiatry, ideally patient would be a candidate for hospice. ?Ongoing issues with poor p.o. intake now resulting in acute kidney injury now. ?Currently back on IV fluids. ?We will monitor response. ? ?Subjective: No further diarrhea.  No nausea no vomiting no fever no chills.  Refused BiPAP last night as she dozed off after waiting for the rest of her therapist to come back.  ? ?Physical Exam: ?Vitals:  ? 03/13/22 0846 03/13/22 0851 03/13/22 0852 03/13/22 1504  ?BP: 122/65  122/65 115/61  ?Pulse: 98  98 96  ?Resp:    18  ?Temp: 99.3 ?F (37.4 ?C)   98.2 ?F (36.8 ?C)  ?TempSrc: Oral   Oral  ?SpO2: 97% 93%  95%  ?Weight:      ?Height:      ? ?General: Appear in moderate distress; no visible Abnormal Neck Mass Or lumps, Conjunctiva normal ?Cardiovascular: S1 and S2 Present, no Murmur, ?Respiratory: increased respiratory effort, Bilateral Air entry present and  bilateral Crackles, no wheezes ?Abdomen: Bowel Sound present, Non tender ?Extremities: no Pedal edema ?Neurology: alert and oriented to time, place, and person ?Gait not checked due to patient safety concerns  ? ?Data Reviewed: ?I have Reviewed nursing notes, Vitals, and Lab results since pt's last encounter.  Pertinent lab results CBC and BMP ?I have ordered test including CBC and BMP   ? ?Family Communication: None at bedside ? ?Disposition: ?Status is: Inpatient ?Remains inpatient appropriate because: Ongoing issues with poor p.o. intake. ? ?Author: ? , MD ?03/13/2022 7:16 PM ? ?For on call review [email or web address]. ?

## 2022-03-13 NOTE — Assessment & Plan Note (Signed)
Patient chronically on atenolol 25 mg twice daily. ?Due to poor p.o. intake and resultant hypotension this medication was on hold. ?We will resume now that the blood pressure is improved but instead of atenolol we will use metoprolol 12.5 mg twice daily. ?

## 2022-03-13 NOTE — Consult Note (Addendum)
Brief Psychiatry Consult Note  ?Went to see pt shortly before lunch; she was asleep with no family at bedside. No evidence of psychiatric disturbance in chart other than ongoing s/s metabolic encephalopathy and refusal of BIPAP.  Will leave sign out for pt to be seen tomorrow.  ? ?- Pt to be seen tomorrow.  ? ?Joycelyn Schmid A Sereniti Wan ? ?

## 2022-03-14 ENCOUNTER — Inpatient Hospital Stay (HOSPITAL_COMMUNITY): Payer: Medicare Other

## 2022-03-14 LAB — BASIC METABOLIC PANEL
Anion gap: 5 (ref 5–15)
BUN: 15 mg/dL (ref 8–23)
CO2: 37 mmol/L — ABNORMAL HIGH (ref 22–32)
Calcium: 8.7 mg/dL — ABNORMAL LOW (ref 8.9–10.3)
Chloride: 93 mmol/L — ABNORMAL LOW (ref 98–111)
Creatinine, Ser: 1.07 mg/dL — ABNORMAL HIGH (ref 0.44–1.00)
GFR, Estimated: 56 mL/min — ABNORMAL LOW (ref >60)
Glucose, Bld: 110 mg/dL — ABNORMAL HIGH (ref 70–99)
Potassium: 3.1 mmol/L — ABNORMAL LOW (ref 3.5–5.1)
Sodium: 135 mmol/L (ref 135–145)

## 2022-03-14 LAB — CBC
HCT: 27.2 % — ABNORMAL LOW (ref 36.0–46.0)
Hemoglobin: 8.2 g/dL — ABNORMAL LOW (ref 12.0–15.0)
MCH: 30.5 pg (ref 26.0–34.0)
MCHC: 30.1 g/dL (ref 30.0–36.0)
MCV: 101.1 fL — ABNORMAL HIGH (ref 80.0–100.0)
Platelets: 386 10*3/uL (ref 150–400)
RBC: 2.69 MIL/uL — ABNORMAL LOW (ref 3.87–5.11)
RDW: 14.5 % (ref 11.5–15.5)
WBC: 13 10*3/uL — ABNORMAL HIGH (ref 4.0–10.5)
nRBC: 0 % (ref 0.0–0.2)

## 2022-03-14 LAB — MAGNESIUM: Magnesium: 1.6 mg/dL — ABNORMAL LOW (ref 1.7–2.4)

## 2022-03-14 MED ORDER — POTASSIUM CHLORIDE CRYS ER 20 MEQ PO TBCR
40.0000 meq | EXTENDED_RELEASE_TABLET | Freq: Once | ORAL | Status: AC
  Administered 2022-03-14: 40 meq via ORAL
  Filled 2022-03-14: qty 2

## 2022-03-14 MED ORDER — SACCHAROMYCES BOULARDII 250 MG PO CAPS: 250.0000 mg | ORAL_CAPSULE | Freq: Two times a day (BID) | ORAL | 0 refills | Status: AC

## 2022-03-14 MED ORDER — ENSURE ENLIVE PO LIQD: 237.0000 mL | Freq: Three times a day (TID) | ORAL | 0 refills | Status: AC

## 2022-03-14 MED ORDER — THIAMINE HCL 100 MG PO TABS: 100.0000 mg | ORAL_TABLET | Freq: Every day | ORAL | 0 refills | Status: DC

## 2022-03-14 MED ORDER — ATENOLOL 25 MG PO TABS
25.0000 mg | ORAL_TABLET | Freq: Two times a day (BID) | ORAL | Status: DC
Start: 2022-03-14 — End: 2022-03-15
  Administered 2022-03-14: 25 mg via ORAL
  Filled 2022-03-14: qty 1

## 2022-03-14 MED ORDER — BUDESONIDE 3 MG PO CPEP
9.0000 mg | ORAL_CAPSULE | Freq: Every day | ORAL | Status: DC
  Administered 2022-03-14: 9 mg via ORAL
  Filled 2022-03-14: qty 3

## 2022-03-14 MED ORDER — MAGNESIUM SULFATE 2 GM/50ML IV SOLN
2.0000 g | Freq: Once | INTRAVENOUS | Status: AC
  Administered 2022-03-14: 2 g via INTRAVENOUS
  Filled 2022-03-14: qty 50

## 2022-03-14 MED ORDER — TRAMADOL HCL 50 MG PO TABS: 50.0000 mg | ORAL_TABLET | Freq: Two times a day (BID) | ORAL | Status: DC | PRN

## 2022-03-14 MED ORDER — CLONAZEPAM 0.5 MG PO TABS: 0.5000 mg | ORAL_TABLET | Freq: Three times a day (TID) | ORAL | 0 refills | Status: AC

## 2022-03-14 MED ORDER — DIPHENOXYLATE-ATROPINE 2.5-0.025 MG PO TABS: 1.0000 | ORAL_TABLET | Freq: Four times a day (QID) | ORAL | 0 refills | Status: DC | PRN

## 2022-03-14 MED ORDER — OLANZAPINE 2.5 MG PO TABS: 2.5000 mg | ORAL_TABLET | Freq: Every day | ORAL | 0 refills | Status: DC

## 2022-03-14 MED ORDER — GERHARDT'S BUTT CREAM: 1.0000 "application " | TOPICAL_CREAM | Freq: Three times a day (TID) | CUTANEOUS | 0 refills | Status: DC

## 2022-03-14 NOTE — TOC Transition Note (Signed)
Transition of Care (TOC) - CM/SW Discharge Note ? ? ?Patient Details  ?Name: Claire [name] ?MRN: [id number] ?Date of Birth: 12-May-1951 ? ?Transition of Care (TOC) CM/SW Contact:  ?Ozzy Bohlken Aris Lot, LCSW ?Phone Number: ?03/14/2022, 3:58 PM ? ? ?Clinical Narrative:    ? ?[facility] ?Anticipated DC date: 03/14/22 ?Family notified: Daughter Jaymes Graff ?Transport by: Sharin Mons ? ? ?Per MD patient ready for DC to [facility]. RN, patient, patient's family, and facility notified of DC. Discharge Summary and FL2 sent to facility. RN to call report prior to discharge [phone]). DC packet on chart. Ambulance transport requested for patient.  ? ?CSW will sign off for now as social work intervention is no longer needed. Please consult [place] again if new needs arise.  ? ?Final next level of care: Skilled Nursing Facility ?Barriers to Discharge: No Barriers Identified ? ? ?Patient Goals and CMS Choice ?  ?CMS Medicare.gov Compare Post Acute Care list provided to:: Patient Represenative (must comment) (Patients daughter [name]) ?  ? ?Discharge Placement ?  ?           ?Patient chooses bed at:  [facility]) ?Patient to be transferred to facility by: [facility] ?Name of family member notified: Message left with Jaymes Graff (Daughter)   [phone] [facility] Phone) and MD spoke with daughter ?Patient and family notified of of transfer: 03/14/22 ? ?Discharge Plan and Services ?In-house Referral: Clinical Social Work ?Discharge Planning Services: CM Consult ?Post Acute Care Choice: Durable Medical Equipment          ?DME Arranged: Bipap ?DME Agency: [employer] ?Date DME Agency Contacted: 03/06/22 ?Time DME Agency Contacted: 1637 ?Representative spoke with at DME Agency: Leavy Cella ?  ?  ?  ?  ?  ? ?Social Determinants of Health (SDOH) Interventions ?  ? ? ?Readmission Risk Interventions ? ?  03/09/2022  ?  4:04 PM  ?Readmission Risk Prevention Plan  ?Transportation Screening Complete  ?HRI or Home Care Consult Complete   ?Social Work Consult for Recovery Care Planning/Counseling Complete  ?Palliative Care Screening Complete  ? ? ? ? ? ?

## 2022-03-14 NOTE — Progress Notes (Signed)
PT Cancellation Note ? ?Patient Details ?Name: Claire [name] ?MRN: [id number] ?DOB: 1951-05-31 ? ? ?Cancelled Treatment:    Reason Eval/Treat Not Completed: Patient declined, no reason specified (pt refused any therapy today and reports she doesn't want to move or do therapy at all. Daughter  [name] present and reports they have been having conversations of hospice today based on pt desires. Will attempt another date after further family discussion) ? ? ?Ireene Ballowe B Sairah Knobloch ?03/14/2022, 11:56 AM ?Torii Royse P, PT ?Acute Rehabilitation Services ?Pager: [phone] ?Office: [phone] ? ?

## 2022-03-14 NOTE — TOC Progression Note (Signed)
Transition of Care (TOC) - Progression Note  ? ? ?Patient Details  ?Name: Claire [name] ?MRN: [id number] ?Date of Birth: August 15, 1951 ? ?Transition of Care (TOC) CM/SW Contact  ?Liley Rake Aris Lot, LCSW ?Phone Number: ?03/14/2022, 3:04 PM ? ?Clinical Narrative:    ? ?CSW contacted [facility] liaison and confirmed they have received BIPAP and can accept pt today. Today is the last day that insurance [employer] Harvey is approved for. CSW to coordinate DC once summary is in.  ? ?1330: CSW added to secure chat with MD and PMT. PMT provided notification that pt family deciding on CC vs SNF; CSW will follow regarding decision on disposition.  ? ? ?Expected Discharge Plan: Skilled Nursing Facility ?Barriers to Discharge: Continued Medical Work up ? ?Expected Discharge Plan and Services ?Expected Discharge Plan: Skilled Nursing Facility ?In-house Referral: Clinical Social Work ?Discharge Planning Services: CM Consult ?Post Acute Care Choice: Durable Medical Equipment ?Living arrangements for the past 2 months: Single Family Home ?                ?DME Arranged: Bipap ?DME Agency: [employer] ?Date DME Agency Contacted: 03/06/22 ?Time DME Agency Contacted: 1637 ?Representative spoke with at DME Agency: Leavy Cella ?  ?  ?  ?  ?  ? ? ?Social Determinants of Health (SDOH) Interventions ?  ? ?Readmission Risk Interventions ? ?  03/09/2022  ?  4:04 PM  ?Readmission Risk Prevention Plan  ?Transportation Screening Complete  ?HRI or Home Care Consult Complete  ?Social Work Consult for Recovery Care Planning/Counseling Complete  ?Palliative Care Screening Complete  ? ? ?

## 2022-03-14 NOTE — Consult Note (Addendum)
[name] Psychiatry New Face-to-Face Psychiatric Evaluation ? ? ?Service Date: March 14, 2022 ?LOS:  LOS: 7 days  ? ? ?Assessment  ?Claire [name] is a 71 y.o. female admitted medically for 03/06/2022  9:46 AM for delirium and failure to thrive. She carries the psychiatric diagnoses of depression and anxiety and has a past medical history below. She has a recent suicide attempt.Psychiatry was consulted for delirium and recent suicide attempt by Orland Mustard, MD.  ? ?At this time, the patient's presentation is most consistent with hypoactive delirium, most likely due to multiple etiologies including but not limited to infection, medications, pain, altered sleep/wake cycle, and limited mobility. The patient would strongly benefit from medical treatment of chronic hypercapnic respiratory failure, possible CAP, cachexia, hyponatrimia possible UTI as well as further investigation for etiologies of delirium. Although she has a recent suicide attempt, she does not remember this (verified with daughter at bedside); she does endorse passive SI on exam but denies any intent or plan and cites her religion as a protective factor. During this time period, minimization of delirogenic insults will be of utmost importance; this includes promoting the normal circadian cycle, minimizing lines/tubes, avoiding deliriogenic medications such as benzodiazepines and anticholinergic medications, and frequently reorienting the patient. Symptomatic treatment for agitation can be provided by antipsychotic medications, though it is important to remember that these do not treat the underlying etiology of delirium. Notably, there can be a time lag effect between treatment of a medical problem and resolution of delirium. This time lag effect may be of longer duration in the elderly, and those with underlying cognitive impairment or brain injury. ? ?4/4: Pt with significantly improved attention, organization, etc. Ongoing paranoia towards  daughter + hospital staff although this is the first time I have evaluated her without family present. Declined antipsychotics citing prior bad s/e. Delirium improved likely 2/2 thiamine and/or pulmonary intervention.  ? ?4/5: Continued improvement in attention and thought organization. Ongoing paranoia although pt makes efforts to hide this (indicating improving insight). Now agreeable to trial olanzapine 2.5 mg, had previously tolerated this in IM form at [facility]. Discussed r/b/se including hypotension; did tell pt it was an antipsychotic.  ? ?4/6: Pt more agitated, confused although saw in AM instead of PM with no family at bedside. ? ?4/7: Moderate interval improvement, less paranoid towards myself. Ongoing poor appetite.   ? ?4/10:  Client reports feeling "alright, except for stomach pain", MD in the room and ordered medication.  She reports her depression at a 6/10 with 10 being the highest as "I can't get up and go no where."  Moderate anxiety, no recent panic attacks.  Her sleep is "pretty good, if I can get comfortable".  Her appetite continues to be low related to her experiencing ongoing diarrhea.  Alert and oriented times 3 on assessment this morning, no confusion or agitation or paranoia noted.  Denies any side effects from her Zyprexa which appears to be working for her as her symptom of paranoia has dissipated. ? ?Diagnoses:  ?Active Hospital problems: ?Principal Problem: ?  Acute metabolic encephalopathy ?Active Problems: ?  Acquired hypothyroidism ?  Hyponatremia ?  Anemia of chronic disease ?  Multifocal pneumonia ?  AKI (acute kidney injury) (HCC) ?  Chronic respiratory failure with hypoxia and hypercapnia (HCC) ?  Protein-calorie malnutrition, severe (HCC) ?  Mycobacterium avium infection with cavitory lesion ?  Anxiety and depression/PTSD/recent SI attempt ?  COPD (chronic obstructive pulmonary disease) (HCC) ?  Goals of care,  counseling/discussion ?  Antibiotic-associated diarrhea ?   Essential hypertension ?  ? ? ?Plan  ?## Safety and Observation Level:  ?- Based on my clinical evaluation, I estimate the patient to be at low/moderate risk of self harm in the current setting largely due to delirium ?- At this time, we recommend a routine level of observation; would have low threshold to escalate given recovering metabolic encephalopathy  ? ? ?## Medications:  ?-- c clonazepam 0.25 mg BID (decreased recently, risk of further decrease > benefit) ?- continue to hold wellbutrin (can be deliriogenic) ? ? ?- c olanzapine 2.5 mg QHS  ?- now on oral thiamine ? ?## Medical Decision Making Capacity:  ?Not formally assessed ? ?## Further Work-up:  ?-- per primary ? ?-- While receiving olanzapine, please monitor and replete K+ to 4 and Mg2+ to 2 ? ?-- most recent EKG on 4/2 had QtC of 443 ? ?## Disposition:  ?-- per primary ? ? ?Thank you for this consult request. Recommendations have been communicated to the primary team.  We will continue to follow at this time.  ? ?Nanine MeansJamison Leith Hedlund, NP ? ? ?New history  ?Relevant Aspects of Hospital Course:  ?Admitted on 03/06/2022 for encephalopathy. ? ?Patient Report:  ?Patient seen in AM as her MD was completing his assessment and placing pain medication of acetaminophen for her stomach pain.  Moderate depression and anxiety related to her lack of mobility and health issues.  No paranoia on assessment.  She was pleasant and cooperative.  Her appetite continues to be low as she reports experiencing diarrhea after eating.   Denies SI, HI, AH/VH. ? ?ROS:  ?Incorporated in the HPI ? ?Collateral information:  ?No one at her bedside this am ? ?Psychiatric/social/family/medical history /allergies  ?See initial consult note. ? ?Medications:  ? ?Current Facility-Administered Medications:  ?  acetaminophen (TYLENOL) tablet 1,000 mg, 1,000 mg, Oral, TID, Shaffer, Luvenia ReddenShae Lee N, NP, 1,000 mg at 03/14/22 0951 ?  albuterol (PROVENTIL) (2.5 MG/3ML) 0.083% nebulizer solution 2.5 mg, 2.5 mg,  Inhalation, Q2H PRN, Orland MustardWolfe, Allison, MD ?  atenolol (TENORMIN) tablet 25 mg, 25 mg, Oral, BID, Rolly SalterPatel, Pranav M, MD, 25 mg at 03/14/22 [id number] ?  budesonide (ENTOCORT EC) 24 hr capsule 9 mg, 9 mg, Oral, Daily, Rolly SalterPatel, Pranav M, MD ?  clonazePAM Scarlette Calico(KLONOPIN) disintegrating tablet 0.25 mg, 0.25 mg, Oral, TID, Orland MustardWolfe, Allison, MD, 0.25 mg at 03/14/22 0951 ?  dicyclomine (BENTYL) capsule 10 mg, 10 mg, Oral, TID AC, Standley BrookingGoodrich, Daniel P, MD, 10 mg at 03/14/22 0630 ?  diphenoxylate-atropine (LOMOTIL) 2.5-0.025 MG per tablet 1 tablet, 1 tablet, Oral, QID PRN, Rolly SalterPatel, Pranav M, MD, 1 tablet at 03/13/22 1514 ?  enoxaparin (LOVENOX) injection 30 mg, 30 mg, Subcutaneous, Q24H, Rolly SalterPatel, Pranav M, MD, 30 mg at 03/13/22 1745 ?  famotidine (PEPCID) tablet 20 mg, 20 mg, Oral, Daily PRN, Orland MustardWolfe, Allison, MD ?  feeding supplement (ENSURE ENLIVE / ENSURE PLUS) liquid 237 mL, 237 mL, Oral, TID BM, Dahal, Binaya, MD, 237 mL at 03/14/22 1000 ?  ferrous sulfate tablet 325 mg, 325 mg, Oral, Q breakfast, Orland MustardWolfe, Allison, MD, 325 mg at 03/14/22 [id number] ?  Gerhardt's butt cream, , Topical, TID, Rolly SalterPatel, Pranav M, MD, Given at 03/14/22 1204 ?  haloperidol (HALDOL) tablet 0.5 mg, 0.5 mg, Oral, Q8H PRN **OR** haloperidol lactate (HALDOL) injection 0.5 mg, 0.5 mg, Intramuscular, Q8H PRN, Cinderella, Margaret A ?  levothyroxine (SYNTHROID) tablet 75 mcg, 75 mcg, Oral, Daily, Orland MustardWolfe, Allison, MD, 75 mcg at 03/14/22 0630 ?  magnesium sulfate IVPB 2 g 50 mL, 2 g, Intravenous, Once, Rolly Salter, MD, Last Rate: 50 mL/hr at 03/14/22 1204, 2 g at 03/14/22 1204 ?  MEDLINE mouth rinse, 15 mL, Mouth Rinse, BID, Rolly Salter, MD, 15 mL at 03/13/22 2300 ?  mometasone-formoterol (DULERA) 200-5 MCG/ACT inhaler 2 puff, 2 puff, Inhalation, BID, Rolly Salter, MD, 2 puff at 03/14/22 0757 ?  OLANZapine (ZYPREXA) tablet 2.5 mg, 2.5 mg, Oral, QHS, Cinderella, Margaret A, 2.5 mg at 03/13/22 2252 ?  piperacillin-tazobactam (ZOSYN) IVPB 3.375 g, 3.375 g, Intravenous, Q8H, Pham,  Minh Q, RPH-CPP, Last Rate: 12.5 mL/hr at 03/14/22 0633, 3.375 g at 03/14/22 5456 ?  saccharomyces boulardii (FLORASTOR) capsule 250 mg, 250 mg, Oral, BID, Rolly Salter, MD, 250 mg at 03/14/22 2563 ?  sodium ch

## 2022-03-14 NOTE — Progress Notes (Signed)
Patient Claire [name]      DOB: 10/06/1951      LXB:[id number] ? ? ? ?  ?Palliative Medicine Team ? ? ? ? ?This RN called daughter, Claire [name] on the phone to answer any questions and discuss options for her mother moving forward per request of Dr. Allena Katz. All questions answered at this time. Claire [name] explains her brother is having a hard time getting around and she wants to discuss with him and work a plan for him to visit the patient before she is full comfort care on hospice if that is the route they choose. She understands the options are SNF, residential hospice (pending approval) or home with hospice support. This RN verbalized understanding and offered reassurance that each patient's treatment plan, even in comfort care, is individualized and that we would work with the patient and family to best serve all involved. She was receptive of this.  ? ?Plan is for daughter Claire [name] to discuss with son and let medical team know their decision on next steps. Will continue to be available for support and will follow for any needs or advances.  ? ? ? ?Thank you for allowing the Palliative Medicine Team to assist in the care of this patient. ?  ?  ?Shelda Jakes, MSN, RN ?Palliative Medicine Team ?Team Phone: [phone]  ?This phone is monitored 7a-7p, please reach out to attending physician outside of these hours for urgent needs.   ?

## 2022-03-14 NOTE — Progress Notes (Signed)
Nutrition Follow-up ? ?DOCUMENTATION CODES:  ?Severe malnutrition in context of chronic illness, Underweight ? ?INTERVENTION:  ?Continue current diet as ordered, encourage PO intake ?Snacks TID between meals ?Continue strawberry Ensure Enlive po BID, each supplement provides 350 kcal and 20 grams of protein. ?MVI with minerals daily ? ?NUTRITION DIAGNOSIS:  ?Severe Malnutrition related to social / environmental circumstances (mental illness) as evidenced by severe fat depletion, severe muscle depletion. ?- New dx established 4/10 ? ?GOAL:  ?Patient will meet greater than or equal to 90% of their needs ?- progressing, supplements in place, snacks to be added ? ?MONITOR:  ?PO intake, Supplement acceptance, Skin, Weight trends, Labs, I & O's ? ?REASON FOR ASSESSMENT:  ?Consult ?Poor PO ? ?ASSESSMENT:  ?71 y.o. female with PMH significant for HTN, hypothyroidism, hyperparathyroidism, PTSD, anxiety, depression, GERD, COPD on 2 to 3 L oxygen, cavitary MAC, chronic anemia. Presents with altered mental status, shortness of breath. ? ?Pt resting in bed at the time of assessment, daughter at bedside. Pt reports a poor appetite that has been ongoing for about a year. Reports that foods don't taste like they should and they are unappealing. Daughter reports that she has been bringing food from outside the hospital and snacks and that pt is refusing to eat. Is drinking ensure, but only likes strawberry. Will communicate with nursing staff. Will also add snacks TID. ? ?Noted that palliative care is working with family on discharge planning.  ? ?Nutritionally Relevant Medications: ?Scheduled Meds: ? Ensure Enlive   237 mL Oral TID BM  ? ferrous sulfate  325 mg Oral Q breakfast  ? levothyroxine  75 mcg Oral Daily  ? saccharomyces boulardii  250 mg Oral BID  ? thiamine  100 mg Oral Daily  ? ?Continuous Infusions: ? piperacillin-tazobactam (ZOSYN)  IV 3.375 g (03/14/22 1328)  ? ?PRN Meds: famotidine ? ?Labs Reviewed: ?Potassium  3.1 ?Creatinine 1.07 ?Magnesium 1.6  ? ?NUTRITION - FOCUSED PHYSICAL EXAM: ?Flowsheet Row Most Recent Value  ?Orbital Region Severe depletion  ?Upper Arm Region Severe depletion  ?Thoracic and Lumbar Region Severe depletion  ?Buccal Region Severe depletion  ?Temple Region Severe depletion  ?Clavicle Bone Region Severe depletion  ?Clavicle and Acromion Bone Region Severe depletion  ?Scapular Bone Region Severe depletion  ?Dorsal Hand Severe depletion  ?Patellar Region Severe depletion  ?Anterior Thigh Region Severe depletion  ?Posterior Calf Region Severe depletion  ?Edema (RD Assessment) None  ?Hair Reviewed  ?Eyes Reviewed  ?Mouth Reviewed  ?Skin Reviewed  ?Nails Reviewed  ? ?Diet Order:   ?Diet Order   ? ?       ?  Diet regular Room service appropriate? Yes; Fluid consistency: Thin  Diet effective now       ?  ? ?  ?  ? ?  ? ? ?EDUCATION NEEDS:  ?Not appropriate for education at this time ? ?Skin:  Skin Assessment: Reviewed RN Assessment ? ?Last BM:  4/9 - type 7 ? ?Height:  ?Ht Readings from Last 1 Encounters:  ?03/07/22 _0  (1.6 m)  ? ? ?Weight:  ?Wt Readings from Last 1 Encounters:  ?03/07/22 38.7 kg  ? ? ?Ideal Body Weight:  52.3 kg ? ?BMI:  Body mass index is 15.11 kg/m?. ? ?Estimated Nutritional Needs:  ?Kcal:  1500-1700 ?Protein:  75-85 grams ?Fluid:  >/= 1.5 L/day ? ? ?Ranell Patrick, RD, LDN ?Clinical Dietitian ?RD pager # available in [place]  ?After hours/weekend pager # available in [place] ?

## 2022-03-14 NOTE — Discharge Summary (Signed)
?Physician Discharge Summary ?  ?Patient: Claire [name] MRN: [id number] DOB: 1951/02/07  ?Admit date:     03/06/2022  ?Discharge date: 03/14/22  ?Discharge Physician: Berle Mull  ?PCP: Donella Stade, PA-C ? ?Recommendations at discharge: ? Follow up with PCP in 1 week ?Continue to use BIPAP QHS ?Continue to engage with Palliative care for Goals of care ? ?Discharge Diagnoses: ?Principal Problem: ?  Acute metabolic encephalopathy ?Active Problems: ?  AKI (acute kidney injury) ([place]) ?  Multifocal pneumonia ?  Chronic respiratory failure with hypoxia and hypercapnia (HCC) ?  COPD (chronic obstructive pulmonary disease) ([place]) ?  Anxiety and depression/PTSD/recent SI attempt ?  Mycobacterium avium infection with cavitory lesion ?  Protein-calorie malnutrition, severe ([place]) ?  Anemia of chronic disease ?  Acquired hypothyroidism ?  Hyponatremia ?  Goals of care, counseling/discussion ?  Antibiotic-associated diarrhea ?  Essential hypertension ? ?Hospital Course: ?71 year old woman PMH including chronic hypoxic respiratory failure, COPD, cavitary MAC, PTSD, anxiety, depression presenting with confusion and shortness of breath.   ?Found to have acute encephalopathy which is multifactorial as well as sepsis secondary to pneumonia. ? ?3/23-4/1 admitted at [facility] for hyponatremia. ?02/17/22 admitted for Klonopin overdose and then transferred to behavioral health for stabilization. ?4/2 admitted to the hospital this time.  Pulmonary consulted. psychiatry consulted. palliative care consult. Pt was treated with IV zosyn for 7 days. Pt and family wanted to pursue the goals of treat what is treatable with rehab.  ? ?Assessment and Plan: ?* Acute metabolic encephalopathy ?Presents with confusion.  Delirium most likely in the setting of sepsis and acute flareup of her chronic mood disorder. ?Delirium is actually improving in the hospital. ?Psychiatry is following Highly appreciate their assistance. ?Continue Zyprexa to help  significantly with her paranoia. ?Treated with IV thiamine. ? ?AKI (acute kidney injury) ([place]) ?Baseline serum creatinine around 0.9. ?Serum creatinine worsening to 1.3. ?Treated with IV fluids with improvement in renal function.   ?Patient was encouraged to stop Dr. Garth Bigness drinks/high caffeine intake. ? ?COPD (chronic obstructive pulmonary disease) ([place]) ?Chronic respiratory failure with hypoxia and hypercapnia (HCC) ?Multifocal pneumonia ?Continue supplemental oxygen.  ?Bullous emphysema.  Continue bronchodilators, oxygen. ?BIPAP at night. Per pulmonology needs noninvasive ventilation at night. ?Completed 7 days of Zosyn per pulmonology, in the hospital. ?Also order placed to maintain oxygenation between 88 and 92% as the family is concerned with overcorrection. ? ?Multifocal pneumonia ?Sepsis secondary to multifocal pneumonia. ?COPD without any exacerbation. ?Chronic respiratory failure with hypoxia and hypercarbia. ?Brought in from home due to concerns with confusion and shortness of breath. ?Found to have acute on chronic hypercarbic respiratory failure as well as pneumonia. ?Met SIRS criteria on admission with tachypnea and leukocytosis with evidence of infection with pneumonia. ?Patient recommended to continue to use BiPAP nightly. ? ?Mycobacterium avium infection with cavitory lesion ?Followed by pulmonology and ID outpatient.  ?Tx recommended outpatient, although at present pulmonary things with the patient would not be a candidate for initiation of the therapy. ? ?Protein-calorie malnutrition, severe ([place]) ?Failure to thrive ?poor oral intake ?Underweight ?Placing the patient at high risk of poor outcome. ?Body mass index is 15.11 kg/m?[name]  ?Nutrition Problem: Inadequate oral intake ?Etiology:  (hallucinations) ?Nutrition Interventions: ?Interventions: Ensure Enlive (each supplement provides 350kcal and 20 grams of protein)  ? ?Anemia of chronic disease ?Baseline hgb appears to be around 8-10, has  seen hematology outpatient ?Stable, continue oral iron and follow  ? ?Acquired hypothyroidism ?TSH high but free T4 WNL  ?Had not been  taking medication, continue home dose for now  ? ?Hyponatremia ?mild, stable, follow. ? ?Essential hypertension ?Patient chronically on atenolol 25 mg twice daily. ?Due to poor p.o. intake and resultant hypotension this medication was on hold. ?We will resume now that the blood pressure is improved  ? ?Antibiotic-associated diarrhea ?Diarrhea improving with the use of Lomotil.  Patient is also on budesonide at home which we will resume. ?Most likely in the setting of antibiotics use. ?X-ray abdomen unremarkable. ? ?Goals of care, counseling/discussion ?Extensive discussion with daughter with regards to patient's current prognosis. ?Patient with poor reserve in the setting of severe protein calorie malnutrition and failure to thrive with underweight status in the setting of chronic pulmonary disease COPD, respiratory failure and MAC infection confounded by ongoing issues with mood disorder which includes anxiety, depression, PTSD as well as recent suicidal attempt. ?Prognosis is guarded in the setting ?Patient currently DNR on admission. ?I expect that the patient will continue to have ongoing recurrent infections due to her poor reserves and deconditioning which would lead to further change in medications. ?If the patient does not improve with SNF and change in medications with psychiatry, ideally patient would be a candidate for hospice. ?Ongoing issues with poor p.o. intake now resulting in acute kidney injury now. ? ?Continue to engage with Palliative care in outpt area for goals of care conversations. ? ?[place] Controlled Substance Reporting System database was reviewed. and patient was instructed, not to drive, operate heavy machinery, perform activities at heights, swimming or participation in water activities or provide baby-sitting services while on Pain, Sleep and  Anxiety Medications; until their outpatient Physician has advised to do so again. Also recommended to not to take more than prescribed Pain, Sleep and Anxiety Medications.  ? ?Consultants: Palliative care  ?PCCM  ?Psychiatry  ? ?Procedures performed:  ?none ?DISCHARGE MEDICATION: ?Allergies as of 03/14/2022   ? ?   Reactions  ? Bactrim [sulfamethoxazole-trimethoprim]   ? ACUTE RENAL fAILURE/HYPONATREMIA/HYPERKALEMIA  ? Macrodantin [nitrofurantoin Macrocrystal] Rash  ? Ibuprofen Other (See Comments)  ? unknown  ? Ivp Dye [iodinated Contrast Media] Other (See Comments)  ? unknown  ? Medrol [methylprednisolone]   ? Hallucinations  ? Trimethoprim   ? Fatigue/weakness/no appetite  ? Zithromax [azithromycin]   ? diarrhea  ? ?  ? ?  ?Medication List  ?  ? ?STOP taking these medications   ? ?cefdinir 300 MG capsule ?Commonly known as: OMNICEF ?  ?ipratropium 0.06 % nasal spray ?Commonly known as: ATROVENT ?  ? ?  ? ?TAKE these medications   ? ?acetaminophen 500 MG tablet ?Commonly known as: TYLENOL ?Take 1,000 mg by mouth every 6 (six) hours as needed for moderate pain. ?  ?albuterol 108 (90 Base) MCG/ACT inhaler ?Commonly known as: VENTOLIN HFA ?INHALE 1-2 PUFFS INTO THE LUNGS EVERY 2 HOURS AS NEEDED FOR WHEEZING OR SHORTNESS OF BREATH. ?What changed: See the new instructions. ?  ?AMBULATORY NON FORMULARY MEDICATION ?Continuous stationary and portable oxygen tanks for use with ambulation. DX: COPD with hypoxia. ?  ?AMBULATORY NON FORMULARY MEDICATION ?Medication Name: full face mask to use with oxygen ?  ?AMBULATORY NON FORMULARY MEDICATION ?Full face mask for night time O2 ?DX: J96.11 ?  ?atenolol 25 MG tablet ?Commonly known as: TENORMIN ?Take 1 tablet (25 mg total) by mouth 2 (two) times daily. ?  ?Budesonide ER 9 MG Tb24 ?Take 9 mg by mouth daily. ?  ?cetirizine 10 MG chewable tablet ?Commonly known as: ZYRTEC ?Chew 10 mg by mouth  daily as needed for allergies. ?  ?cholecalciferol 25 MCG (1000 UNIT) tablet ?Commonly  known as: VITAMIN D ?Take 1,000 Units by mouth daily. ?  ?clonazePAM 0.5 MG tablet ?Commonly known as: KLONOPIN ?Take 1 tablet (0.5 mg total) by mouth 3 (three) times daily. ?  ?dicyclomine 10 MG capsule ?Commonly kn

## 2022-03-14 NOTE — Care Management Important Message (Signed)
Important Message ? ?Patient Details  ?Name: Claire [name] ?MRN: [id number] ?Date of Birth: 1951-06-09 ? ? ?Medicare Important Message Given:  Yes ? ? ? ? ?[name] ?03/14/2022, 9:06 AM ?

## 2022-03-14 NOTE — Progress Notes (Signed)
PT currently not on BiPAP and not in any distress requiring BiPAP. RT will continue to monitor.  ?

## 2022-03-15 ENCOUNTER — Other Ambulatory Visit: Payer: Self-pay | Admitting: Physician Assistant

## 2022-03-15 DIAGNOSIS — I10 Essential (primary) hypertension: Secondary | ICD-10-CM

## 2022-03-16 ENCOUNTER — Encounter: Payer: Self-pay | Admitting: Family Medicine

## 2022-03-16 ENCOUNTER — Ambulatory Visit (INDEPENDENT_AMBULATORY_CARE_PROVIDER_SITE_OTHER): Payer: Medicare Other | Admitting: Family Medicine

## 2022-03-16 VITALS — BP 150/108 | HR 72 | Resp 18 | Ht 63.0 in | Wt 86.0 lb

## 2022-03-16 DIAGNOSIS — E871 Hypo-osmolality and hyponatremia: Secondary | ICD-10-CM | POA: Diagnosis not present

## 2022-03-16 DIAGNOSIS — E876 Hypokalemia: Secondary | ICD-10-CM

## 2022-03-16 DIAGNOSIS — R4589 Other symptoms and signs involving emotional state: Secondary | ICD-10-CM

## 2022-03-16 DIAGNOSIS — E441 Mild protein-calorie malnutrition: Secondary | ICD-10-CM

## 2022-03-16 DIAGNOSIS — K521 Toxic gastroenteritis and colitis: Secondary | ICD-10-CM

## 2022-03-16 DIAGNOSIS — J9611 Chronic respiratory failure with hypoxia: Secondary | ICD-10-CM

## 2022-03-16 DIAGNOSIS — R627 Adult failure to thrive: Secondary | ICD-10-CM | POA: Diagnosis not present

## 2022-03-16 DIAGNOSIS — J432 Centrilobular emphysema: Secondary | ICD-10-CM | POA: Diagnosis not present

## 2022-03-16 DIAGNOSIS — F32A Depression, unspecified: Secondary | ICD-10-CM | POA: Diagnosis not present

## 2022-03-16 DIAGNOSIS — K580 Irritable bowel syndrome with diarrhea: Secondary | ICD-10-CM

## 2022-03-16 DIAGNOSIS — N179 Acute kidney failure, unspecified: Secondary | ICD-10-CM | POA: Diagnosis not present

## 2022-03-16 DIAGNOSIS — F419 Anxiety disorder, unspecified: Secondary | ICD-10-CM

## 2022-03-16 DIAGNOSIS — J9612 Chronic respiratory failure with hypercapnia: Secondary | ICD-10-CM

## 2022-03-16 MED ORDER — THIAMINE HCL 100 MG PO TABS: 100.0000 mg | ORAL_TABLET | Freq: Every day | ORAL | 3 refills | Status: AC

## 2022-03-16 MED ORDER — OLANZAPINE 2.5 MG PO TABS: 2.5000 mg | ORAL_TABLET | Freq: Every day | ORAL | 0 refills | Status: DC

## 2022-03-16 MED ORDER — AMBULATORY NON FORMULARY MEDICATION: 0 refills | Status: AC

## 2022-03-16 MED ORDER — DIPHENOXYLATE-ATROPINE 2.5-0.025 MG PO TABS: 1.0000 | ORAL_TABLET | Freq: Four times a day (QID) | ORAL | 1 refills | Status: AC | PRN

## 2022-03-16 NOTE — Progress Notes (Signed)
? ?Established Patient Office Visit ? ?Subjective:  ?Patient ID: Claire [name], female    DOB: [birth year]  Age: 71 y.o. MRN: [id number] ? ?CC:  ?Chief Complaint  ?Patient presents with  ? Discuss Medications  ?  Lomotil and how to take it. Daughter would like to discuss if patient needs to be on Sodium, Potassium and Magnesium supplements  ? Labs only  ?  Daughter requesting for repeat blood work   ? Discuss Transportation  ?  Daughter would like to discuss help with transportation to help patient with Dr's appointment.   ? Discuss previous UTI  ?  Daughter stated patient had 2 UTI's in the hospital and was given antibiotic by IV. Daughter would like to know if patient needs a repeat UA  ? ? ?HPI ?Claire [name] presents for hospital follow-up for chronic respiratory failure secondary to multifocal pneumonia, bullous emphysema, and UTI.  She was discharged to a skilled nursing facility on Monday.  Her daughter went to meet her there she had only been there for 30 minutes before she fell and injured herself so her daughter decided to go ahead and take her home.  They are interested in getting home health to come in.  She also has been using Tylenol she says she has a little soreness in her right abdominal area from the fall.  He was also prescribed a BiPAP in the hospital and they had it set up in the skilled nursing facility and they let her take the machine home but then they called yesterday saying that it was their machine and then he did not pack.  So she would need to get a new machine delivered to the house. ? ?Unfortunately while in the hospital she started to develop antibiotic associated diarrhea. ? ?She was started on Zyprexa in the hospital on 4 8 for her paranoia her daughter says that she actually notices a big improvement and feels like its been helpful.  She had previously been tried on Lexapro but it caused severe hyponatremia on top of her chronic hyponatremia. ? ?Patient had canceled her eye exam but  her daughter would really like to get her rescheduled and asked for the phone number today. ? ?Past Medical History:  ?Diagnosis Date  ? Acid reflux   ? Anxiety   ? Encephalopathy acute   ? High blood pressure   ? High serum parathyroid hormone (PTH) 07/19/2017  ? 96 06/28/2014, 162 12/29/16  ? Hypothyroid   ? PTSD (post-traumatic stress disorder)   ? ? ?History reviewed. No pertinent surgical history. ? ?Family History  ?Problem Relation Age of Onset  ? Anxiety disorder Mother   ? OCD Daughter   ? Alcohol abuse Father   ? Anxiety disorder Sister   ? ? ?Social History  ? ?Socioeconomic History  ? Marital status: Divorced  ?  Spouse name: Not on file  ? Number of children: 2  ? Years of education: 9114  ? Highest education level: Associate degree: academic program  ?Occupational History  ? 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  Comment: retired  ?Tobacco Use  ? Smoking status: Former  ?  Packs/day: 1.00  ?  Years: 55.00  ?  Pack years: 55.00  ?  Types: Cigarettes  ? Smokeless tobacco: Never  ? Tobacco comments:  ?  Stopped smoking about 40 days ago  ?Vaping Use  ? Vaping Use: Never used  ?Substance and Sexual Activity  ? Alcohol use: No  ? Drug  use: No  ? Sexual activity: Not Currently  ?Other Topics Concern  ? Not on file  ?Social History Narrative  ? Lives alone. She enjoys sewing and painting.  ? ?Social Determinants of Health  ? ?Financial Resource Strain: Low Risk   ? Difficulty of Paying Living Expenses: Not hard at all  ?Food Insecurity: No Food Insecurity  ? Worried About Programme researcher, broadcasting/film/video in the Last Year: Never true  ? Ran Out of Food in the Last Year: Never true  ?Transportation Needs: No Transportation Needs  ? Lack of Transportation (Medical): No  ? Lack of Transportation (Non-Medical): No  ?Physical Activity: Inactive  ? Days of Exercise per Week: 0 days  ? Minutes of Exercise per Session: 0 min  ?Stress: No Stress Concern Present  ? Feeling of Stress : Not at all  ?Social Connections: Socially Isolated   ? Frequency of Communication with Friends and Family: More than three times a week  ? Frequency of Social Gatherings with Friends and Family: Never  ? Attends Religious Services: Never  ? Active Member of Clubs or Organizations: No  ? Attends Banker Meetings: Never  ? Marital Status: Divorced  ?Intimate Partner Violence: Not At Risk  ? Fear of Current or Ex-Partner: No  ? Emotionally Abused: No  ? Physically Abused: No  ? Sexually Abused: No  ? ? ?Outpatient Medications Prior to Visit  ?Medication Sig Dispense Refill  ? acetaminophen (TYLENOL) 500 MG tablet Take 1,000 mg by mouth every 6 (six) hours as needed for moderate pain.    ? albuterol (VENTOLIN HFA) 108 (90 Base) MCG/ACT inhaler INHALE 1-2 PUFFS INTO THE LUNGS EVERY 2 HOURS AS NEEDED FOR WHEEZING OR SHORTNESS OF BREATH. (Patient taking differently: Inhale 1-2 puffs into the lungs every 2 (two) hours as needed for wheezing or shortness of breath.) 25.5 g 1  ? AMBULATORY NON FORMULARY MEDICATION Continuous stationary and portable oxygen tanks for use with ambulation. DX: COPD with hypoxia. 4 Device 11  ? AMBULATORY NON FORMULARY MEDICATION Medication Name: full face mask to use with oxygen 1 each 2  ? AMBULATORY NON FORMULARY MEDICATION Full face mask for night time O2 ?DX: J96.11 1 Device 0  ? atenolol (TENORMIN) 25 MG tablet Take 1 tablet (25 mg total) by mouth 2 (two) times daily. 180 tablet 0  ? Budesonide ER 9 MG TB24 Take 9 mg by mouth daily.    ? cetirizine (ZYRTEC) 10 MG chewable tablet Chew 10 mg by mouth daily as needed for allergies.    ? cholecalciferol (VITAMIN D) 25 MCG (1000 UT) tablet Take 1,000 Units by mouth daily.    ? clonazePAM (KLONOPIN) 0.5 MG tablet Take 1 tablet (0.5 mg total) by mouth 3 (three) times daily. 9 tablet 0  ? dicyclomine (BENTYL) 10 MG capsule Take 10-20 mg by mouth 3 (three) times daily before meals.    ? estradiol (ESTRACE) 0.1 MG/GM vaginal cream PLACE ONE APPLICATOR FULL THREE TIMES A WEEK AND ONE PEA  SIZED AMOUNT APPLIED TO URETHRAL OPENING (Patient taking differently: Place 1 Applicatorful vaginally 3 (three) times a week.) 42.5 g 0  ? famotidine (PEPCID) 20 MG tablet TAKE 1 TABLET BY MOUTH TWICE A DAY (Patient taking differently: Take 20 mg by mouth daily as needed for heartburn or indigestion.) 180 tablet 1  ? feeding supplement (ENSURE ENLIVE / ENSURE PLUS) LIQD Take 237 mLs by mouth 3 (three) times daily between meals. 10000 mL 0  ? ferrous sulfate 325 (  65 FE) MG EC tablet 1 tab PO TID every other day (Patient taking differently: Take 325 mg by mouth daily with breakfast. 1 tablet daily) 90 tablet 11  ? ipratropium-albuterol (DUONEB) 0.5-2.5 (3) MG/3ML SOLN Take 3 mLs by nebulization every 4 (four) hours as needed (wheeze, SOB). 540 mL 1  ? levothyroxine (SYNTHROID) 75 MCG tablet Take 1 tablet (75 mcg total) by mouth daily. 90 tablet 3  ? mometasone-formoterol (DULERA) 100-5 MCG/ACT AERO Inhale 2 puffs into the lungs 2 (two) times daily.    ? Nystatin (GERHARDT'S BUTT CREAM) CREA Apply 1 application. topically 3 (three) times daily. 1 each 0  ? saccharomyces boulardii (FLORASTOR) 250 MG capsule Take 1 capsule (250 mg total) by mouth 2 (two) times daily. (Patient taking differently: Take 250 mg by mouth daily.) 14 capsule 0  ? Tiotropium Bromide Monohydrate (SPIRIVA RESPIMAT) 2.5 MCG/ACT AERS Inhale 2 puffs into the lungs daily.    ? diphenoxylate-atropine (LOMOTIL) 2.5-0.025 MG tablet Take 1 tablet by mouth 4 (four) times daily as needed for diarrhea or loose stools. 30 tablet 0  ? OLANZapine (ZYPREXA) 2.5 MG tablet Take 1 tablet (2.5 mg total) by mouth at bedtime. 30 tablet 0  ? thiamine 100 MG tablet Take 1 tablet (100 mg total) by mouth daily. 30 tablet 0  ? ?No facility-administered medications prior to visit.  ? ? ?Allergies  ?Allergen Reactions  ? Bactrim [Sulfamethoxazole-Trimethoprim]   ?  ACUTE RENAL fAILURE/HYPONATREMIA/HYPERKALEMIA  ? Macrodantin [Nitrofurantoin Macrocrystal] Rash  ? Ibuprofen  Other (See Comments)  ?  unknown  ? Ivp Dye [Iodinated Contrast Media] Other (See Comments)  ?  unknown  ? Medrol [Methylprednisolone]   ?  Hallucinations  ? Trimethoprim   ?  Fatigue/weakness/no appetit

## 2022-03-16 NOTE — Telephone Encounter (Signed)
Reviewed patient's chart. She was seen contacted and seen by her PCP.  ?

## 2022-03-16 NOTE — Patient Instructions (Signed)
Please call to schedule an eye exam : [phone]  [facility]  ?

## 2022-03-16 NOTE — Assessment & Plan Note (Signed)
Lan to recheck renal function. ?

## 2022-03-16 NOTE — Assessment & Plan Note (Signed)
She actually feels like she is doing a little better on the Zyprexa but will need a refill 30-day prescription sent to pharmacy she is actually following up with her psychiatrist on Friday so they can certainly discuss continuing or adjusting at that point in time. ?

## 2022-03-16 NOTE — Assessment & Plan Note (Signed)
Plan to recheck sodium levels today.  To see if at least she is within an acceptable range or if it still low.  Her daughter says she was actually given some sodium tabs at the pharmacy but they had not had a chance to pick them up.  We also discussed liberalizing salt in her diet. ?

## 2022-03-16 NOTE — Assessment & Plan Note (Signed)
Him to be improving now that she is off of antibiotics.  Did refill her Lomotil today. ?

## 2022-03-16 NOTE — Assessment & Plan Note (Signed)
Due to recheck magnesium levels. ?

## 2022-03-16 NOTE — Assessment & Plan Note (Signed)
Placed new order for BiPAP with inspiratory pressure at 10 and expiratory pressure of 5.  That when she can return the BiPAP to the skilled nursing facility. ?

## 2022-03-16 NOTE — Assessment & Plan Note (Signed)
Follow-up with psychiatry on Friday.  Stable on Zyprexa.  Refill sent to pharmacy. ?

## 2022-03-16 NOTE — Assessment & Plan Note (Signed)
Says she does try to drink Ensure at home and she is actually up about 1 pound.  We discussed hydrating well with water.  She says she does not really like water. ?

## 2022-03-17 ENCOUNTER — Other Ambulatory Visit: Payer: Self-pay | Admitting: Physician Assistant

## 2022-03-17 LAB — COMPLETE METABOLIC PANEL WITH GFR
AG Ratio: 0.5 (calc) — ABNORMAL LOW (ref 1.0–2.5)
ALT: 9 U/L (ref 6–29)
AST: 15 U/L (ref 10–35)
Albumin: 2.5 g/dL — ABNORMAL LOW (ref 3.6–5.1)
Alkaline phosphatase (APISO): 90 U/L (ref 37–153)
BUN: 18 mg/dL (ref 7–25)
CO2: 38 mmol/L — ABNORMAL HIGH (ref 20–32)
Calcium: 9.2 mg/dL (ref 8.6–10.4)
Chloride: 92 mmol/L — ABNORMAL LOW (ref 98–110)
Creat: 0.9 mg/dL (ref 0.60–1.00)
Globulin: 5.4 g/dL (calc) — ABNORMAL HIGH (ref 1.9–3.7)
Glucose, Bld: 89 mg/dL (ref 65–99)
Potassium: 4.6 mmol/L (ref 3.5–5.3)
Sodium: 135 mmol/L (ref 135–146)
Total Bilirubin: 0.2 mg/dL (ref 0.2–1.2)
Total Protein: 7.9 g/dL (ref 6.1–8.1)
eGFR: 68 mL/min/{1.73_m2} (ref >=60)

## 2022-03-17 LAB — CBC
HCT: 25.4 % — ABNORMAL LOW (ref 35.0–45.0)
Hemoglobin: 8.2 g/dL — ABNORMAL LOW (ref 11.7–15.5)
MCH: 30.7 pg (ref 27.0–33.0)
MCHC: 32.3 g/dL (ref 32.0–36.0)
MCV: 95.1 fL (ref 80.0–100.0)
MPV: 9 fL (ref 7.5–12.5)
Platelets: 485 10*3/uL — ABNORMAL HIGH (ref 140–400)
RBC: 2.67 10*6/uL — ABNORMAL LOW (ref 3.80–5.10)
RDW: 12.9 % (ref 11.0–15.0)
WBC: 10.1 10*3/uL (ref 3.8–10.8)

## 2022-03-17 LAB — MAGNESIUM: Magnesium: 1.8 mg/dL (ref 1.5–2.5)

## 2022-03-17 NOTE — Progress Notes (Signed)
Hi Claire [name], your hemoglobin is still low but it is stable at 8.2 which is great.  Platelets are up just a little bit this is usually because of inflammation usually secondary to having been recently ill.  Its not in a worrisome range.  Kidney function looks good.  Your sodium and potassium look great.  Your protein levels are low and so your body is nutritional stores are just continuing trying to get some calories in using the Ensure etc.  Your magnesium level looks better.  But you would probably benefit from taking a magnesium supplement over-the-counter to help maintain your levels.  Plan to recheck labs in about 3 weeks.

## 2022-03-18 ENCOUNTER — Encounter (HOSPITAL_COMMUNITY): Payer: Self-pay | Admitting: Psychiatry

## 2022-03-18 ENCOUNTER — Telehealth (INDEPENDENT_AMBULATORY_CARE_PROVIDER_SITE_OTHER): Payer: Medicare Other | Admitting: Psychiatry

## 2022-03-18 ENCOUNTER — Telehealth: Payer: Self-pay | Admitting: Neurology

## 2022-03-18 DIAGNOSIS — F410 Panic disorder [episodic paroxysmal anxiety] without agoraphobia: Secondary | ICD-10-CM

## 2022-03-18 DIAGNOSIS — F4310 Post-traumatic stress disorder, unspecified: Secondary | ICD-10-CM

## 2022-03-18 DIAGNOSIS — F411 Generalized anxiety disorder: Secondary | ICD-10-CM

## 2022-03-18 MED ORDER — MIRTAZAPINE 7.5 MG PO TABS: 7.5000 mg | ORAL_TABLET | Freq: Every day | ORAL | 0 refills | Status: DC

## 2022-03-18 NOTE — Addendum Note (Signed)
Addended by: Teddy Spike on: 03/18/2022 02:01 PM ? ? Modules accepted: Orders ? ?

## 2022-03-18 NOTE — Progress Notes (Signed)
Patient ID: Claire [name], female   DOB: December 08, 1950, 71 y.o.   MRN: [id number] ? ?[place] ?Outpatient Follow up visit ?[facility] ?[name] ?[id number] ?71 y.o. ? ?03/18/2022 ?11:17 AM ? ?Chief Complaint:  ?Follow up , anxiety, med review ? ?Virtual Visit via Video Note ? ?I connected with [name] on 03/18/22 at 11:00 AM EDT by a video enabled telemedicine application and verified that I am speaking with the correct person using two identifiers. ? ?Location: ?Patient: home  ?Provider: home office ?  ?I discussed the limitations of evaluation and management by telemedicine and the availability of in person appointments. The patient expressed understanding and agreed to proceed. ? ? ?  ?I discussed the assessment and treatment plan with the patient. The patient was provided an opportunity to ask questions and all were answered. The patient agreed with the plan and demonstrated an understanding of the instructions. ?  ?The patient was advised to call back or seek an in-person evaluation if the symptoms worsen or if the condition fails to improve as anticipated. ? ?I provided 15 minutes of non-face-to-face time during this encounter. ? ?HPI ? ?Reviewed chart and Dr. Madilyn Fireman notes, she is a post discharge for penumonia, UTI and also on low dose zyprexa that has helped paranoia according to her daughter as per note ? ?She is now living with daughter due to above concern, somewhat difficult to talk clearly but endorsed above and feeling somewhat low, decrease apetite. ? ?Energy level low ? ?Following with providers  ?Understands not to take klonopine extra and at times take half to avoid fogginess and side effects ? ? ?Modifying factor:family, daughter ? ?PTSD is related to her past some flashbacks. Distraction helps ? ? ? ?Aggravating factors: past stressors, childhood, lonliness, medical co morbidity ? ?Medical History; ?Past Medical History:  ?Diagnosis Date  ? Acid reflux   ? Anxiety   ?  Encephalopathy acute   ? High blood pressure   ? High serum parathyroid hormone (PTH) 07/19/2017  ? 96 06/28/2014, 162 12/29/16  ? Hypothyroid   ? PTSD (post-traumatic stress disorder)   ? ? ?Allergies: ?Allergies  ?Allergen Reactions  ? Bactrim [Sulfamethoxazole-Trimethoprim]   ?  ACUTE RENAL fAILURE/HYPONATREMIA/HYPERKALEMIA  ? Macrodantin [Nitrofurantoin Macrocrystal] Rash  ? Ibuprofen Other (See Comments)  ?  unknown  ? Ivp Dye [Iodinated Contrast Media] Other (See Comments)  ?  unknown  ? Medrol [Methylprednisolone]   ?  Hallucinations  ? Trimethoprim   ?  Fatigue/weakness/no appetite  ? Zithromax [Azithromycin]   ?  diarrhea  ? ? ?Medications: ?Outpatient Encounter Medications as of 03/18/2022  ?Medication Sig  ? mirtazapine (REMERON) 7.5 MG tablet Take 1 tablet (7.5 mg total) by mouth at bedtime.  ? acetaminophen (TYLENOL) 500 MG tablet Take 1,000 mg by mouth every 6 (six) hours as needed for moderate pain.  ? albuterol (VENTOLIN HFA) 108 (90 Base) MCG/ACT inhaler INHALE 1-2 PUFFS INTO THE LUNGS EVERY 2 HOURS AS NEEDED FOR WHEEZING OR SHORTNESS OF BREATH. (Patient taking differently: Inhale 1-2 puffs into the lungs every 2 (two) hours as needed for wheezing or shortness of breath.)  ? AMBULATORY NON FORMULARY MEDICATION Continuous stationary and portable oxygen tanks for use with ambulation. DX: COPD with hypoxia.  ? AMBULATORY NON FORMULARY MEDICATION Medication Name: full face mask to use with oxygen  ? AMBULATORY NON FORMULARY MEDICATION Full face mask for night time O2 ?DX: J96.11  ? AMBULATORY NON FORMULARY MEDICATION Medication Name: Bipap and  supplies.  Inspiratory pressure: 10,  Expiratory pressure: 5  ? atenolol (TENORMIN) 25 MG tablet Take 1 tablet (25 mg total) by mouth 2 (two) times daily.  ? Budesonide ER 9 MG TB24 Take 9 mg by mouth daily.  ? cetirizine (ZYRTEC) 10 MG chewable tablet Chew 10 mg by mouth daily as needed for allergies.  ? cholecalciferol (VITAMIN D) 25 MCG (1000 UT) tablet Take 1,000  Units by mouth daily.  ? clonazePAM (KLONOPIN) 0.5 MG tablet Take 1 tablet (0.5 mg total) by mouth 3 (three) times daily.  ? dicyclomine (BENTYL) 10 MG capsule Take 10-20 mg by mouth 3 (three) times daily before meals.  ? diphenoxylate-atropine (LOMOTIL) 2.5-0.025 MG tablet Take 1 tablet by mouth 4 (four) times daily as needed for diarrhea or loose stools.  ? estradiol (ESTRACE) 0.1 MG/GM vaginal cream PLACE ONE APPLICATOR FULL THREE TIMES A WEEK AND ONE PEA SIZED AMOUNT APPLIED TO URETHRAL OPENING (Patient taking differently: Place 1 Applicatorful vaginally 3 (three) times a week.)  ? famotidine (PEPCID) 20 MG tablet TAKE 1 TABLET BY MOUTH TWICE A DAY (Patient taking differently: Take 20 mg by mouth daily as needed for heartburn or indigestion.)  ? feeding supplement (ENSURE ENLIVE / ENSURE PLUS) LIQD Take 237 mLs by mouth 3 (three) times daily between meals.  ? ferrous sulfate 325 (65 FE) MG EC tablet 1 tab PO TID every other day (Patient taking differently: Take 325 mg by mouth daily with breakfast. 1 tablet daily)  ? ipratropium-albuterol (DUONEB) 0.5-2.5 (3) MG/3ML SOLN Take 3 mLs by nebulization every 4 (four) hours as needed (wheeze, SOB).  ? levothyroxine (SYNTHROID) 75 MCG tablet Take 1 tablet (75 mcg total) by mouth daily.  ? mometasone-formoterol (DULERA) 100-5 MCG/ACT AERO Inhale 2 puffs into the lungs 2 (two) times daily.  ? Nystatin (GERHARDT'S BUTT CREAM) CREA Apply 1 application. topically 3 (three) times daily.  ? OLANZapine (ZYPREXA) 2.5 MG tablet Take 1 tablet (2.5 mg total) by mouth at bedtime.  ? saccharomyces boulardii (FLORASTOR) 250 MG capsule Take 1 capsule (250 mg total) by mouth 2 (two) times daily. (Patient taking differently: Take 250 mg by mouth daily.)  ? thiamine 100 MG tablet Take 1 tablet (100 mg total) by mouth daily.  ? Tiotropium Bromide Monohydrate (SPIRIVA RESPIMAT) 2.5 MCG/ACT AERS Inhale 2 puffs into the lungs daily.  ? ?No facility-administered encounter medications on file  as of 03/18/2022.  ? ? ?Family History; ?Family History  ?Problem Relation Age of Onset  ? Anxiety disorder Mother   ? OCD Daughter   ? Alcohol abuse Father   ? Anxiety disorder Sister   ? ? ? ? ? ?Labs: ? ? ? ? ? ?Mental Status Examination;  ? ?Psychiatric Specialty Exam: ?Physical Exam  ?Review of Systems  ?Cardiovascular:  Negative for chest pain.  ?Psychiatric/Behavioral:  Positive for depression. Negative for substance abuse and suicidal ideas.    ?There were no vitals taken for this visit.There is no height or weight on file to calculate BMI.  ?General Appearance:   ?Eye Contact::    ?Speech:  Slow  ?Volume:  Decreased  ?Mood: subdued  ?Affect:    ?Thought Process:  denies paranoia but appears distracted  ?Orientation:  Full (Time, Place, and Person)  ?Thought Content:  Rumination  ?Suicidal Thoughts:  No  ?Homicidal Thoughts:  No  ?Memory:  Immediate;   Fair ?Recent;   Fair  ?Judgement:  Fair  ?Insight:  Shallow  ?Psychomotor Activity:  Normal  ?Concentration:  Fair  ?Recall:  Fair  ?Akathisia:  Negative  ?Handed:  Right  ?AIMS (if indicated):     ?Assets:  Social Support ?Vocational/Educational  ?Sleep:     ? ?Prior documentation reviewed ? ? ?Assessment: ?Axis I: Panic disorder. Possible PTSD. Rule out generalized anxiety disorder. Nicotine dependence ? ?Axis II: Deferred ? ?Axis III:  ?Past Medical History:  ?Diagnosis Date  ? Acid reflux   ? Anxiety   ? Encephalopathy acute   ? High blood pressure   ? High serum parathyroid hormone (PTH) 07/19/2017  ? 96 06/28/2014, 162 12/29/16  ? Hypothyroid   ? PTSD (post-traumatic stress disorder)   ? ? ?Axis IV: Psychosocial. History of trauma ? ? ?Treatment Plan and Summary: ? ? ? ?Generalized anxiety disorder; continue klonopine with caution and take half at times ?Family is supportive, better to be around people for anxiety and lonliness ? ? ?PTSD: related to past triggers or if conlicts ? ?Depression; feels tired, down, poor apetite, will add remeron small dose  7.5mg  ?Also on low dose olanzapine 2.5mg  started for paranoia that has helped and was more prominent when was going thru UTI and peumonia treatment ? ?I prefer she come to office face to face visit next time  ?

## 2022-03-18 NOTE — Telephone Encounter (Signed)
She says that they added for Depression. Should she speak with him about an alternative or start the medication and check blood work?  ?

## 2022-03-18 NOTE — Telephone Encounter (Signed)
Patient's daughter called concerned about medication that was prescribed by Dr. Nicole Cella. They will not speak with daughter because patient still thinks she can manage healthcare on her own and won't sign paperwork for daughter to speak with them.  ? ?RX given was Remeron. She looked up side effects and found out medication can cause hypernatremia which is what caused patient's recent problems with hospitalization. She is very concerned and wants our opinion before [name] starts on this medication. Please advise.  ?

## 2022-03-18 NOTE — Telephone Encounter (Signed)
She is correct Remeron can cause hyponatremia and drop the sodium.  I am assuming he was adding this to maybe help her sleep.  But if she is doing okay with the olanzapine which is the Zyprexa, then I would just stick with that 1 and maybe hold off on the Remeron and less she is not improving.  Since she just got home from the hospital earlier this week its not unusual to take a week or 2 for the body to readjust the sleep cycle after being hospitalized when people are coming in and out all night long.  If she does decide to start it then we probably need to recheck her blood work about 1 week after starting the medication. ?

## 2022-03-18 NOTE — Telephone Encounter (Signed)
I think she can add it in again we can always recheck it in a week or even about 5 days just to be on the safe side. ?

## 2022-03-21 NOTE — Telephone Encounter (Signed)
Spoke with patient's daughter, she wants to hold on having patient start Remeron for now, will let us know if she starts medication so we can order labs and keep a close eye on her.  ?

## 2022-03-23 ENCOUNTER — Ambulatory Visit (INDEPENDENT_AMBULATORY_CARE_PROVIDER_SITE_OTHER): Payer: Medicare Other | Admitting: Physician Assistant

## 2022-03-23 ENCOUNTER — Encounter: Payer: Self-pay | Admitting: Physician Assistant

## 2022-03-23 VITALS — BP 102/67 | HR 83

## 2022-03-23 DIAGNOSIS — E871 Hypo-osmolality and hyponatremia: Secondary | ICD-10-CM

## 2022-03-23 DIAGNOSIS — E876 Hypokalemia: Secondary | ICD-10-CM

## 2022-03-23 DIAGNOSIS — R1011 Right upper quadrant pain: Secondary | ICD-10-CM

## 2022-03-23 DIAGNOSIS — J9612 Chronic respiratory failure with hypercapnia: Secondary | ICD-10-CM

## 2022-03-23 DIAGNOSIS — R410 Disorientation, unspecified: Secondary | ICD-10-CM

## 2022-03-23 DIAGNOSIS — J9611 Chronic respiratory failure with hypoxia: Secondary | ICD-10-CM

## 2022-03-23 DIAGNOSIS — J432 Centrilobular emphysema: Secondary | ICD-10-CM | POA: Diagnosis not present

## 2022-03-23 DIAGNOSIS — J984 Other disorders of lung: Secondary | ICD-10-CM

## 2022-03-23 DIAGNOSIS — H2513 Age-related nuclear cataract, bilateral: Secondary | ICD-10-CM | POA: Diagnosis not present

## 2022-03-23 DIAGNOSIS — R1031 Right lower quadrant pain: Secondary | ICD-10-CM | POA: Diagnosis not present

## 2022-03-23 DIAGNOSIS — N1832 Chronic kidney disease, stage 3b: Secondary | ICD-10-CM

## 2022-03-23 DIAGNOSIS — R0902 Hypoxemia: Secondary | ICD-10-CM | POA: Diagnosis not present

## 2022-03-23 DIAGNOSIS — E43 Unspecified severe protein-calorie malnutrition: Secondary | ICD-10-CM | POA: Diagnosis not present

## 2022-03-23 DIAGNOSIS — Z9181 History of falling: Secondary | ICD-10-CM

## 2022-03-23 NOTE — Patient Instructions (Signed)
Increase zyprexa to 5mg  daily. Follow up in 2 weeks.  ?

## 2022-03-23 NOTE — Progress Notes (Signed)
? ?Established Patient Office Visit ? ?Subjective   ?Patient ID: Claire [name], female    DOB: 03-25-1951  Age: 71 y.o. MRN: [id number] ? ? ?HPI ?Pt is a 71 yo female with chronic respiratory failure and multifocal pneumonia, bullous emphysema who presents to the clinic with daughter to discuss concern for UTI and right upper quadrant pain. She has had the right upper quadrant pain since she fell about 9 days ago. No bowel movement changes. She is having a stool with no straining about once a day. Denies any urinary changes, odor, frequency.  ? ?She is tolerating zyprexa well but patients daughter still states her mood is up and down. Did not start remeron.  ? ?Not using bipap. ? ?She was seen 8 days ago for hospital follow up and labs were stable.  ? ? ?Patient Active Problem List  ? Diagnosis Date Noted  ? Right upper quadrant abdominal pain 03/25/2022  ? Right lower quadrant abdominal pain 03/23/2022  ? Confusion 03/23/2022  ? History of fall 03/23/2022  ? Essential hypertension 03/13/2022  ? Antibiotic-associated diarrhea 03/11/2022  ? Goals of care, counseling/discussion 03/10/2022  ? COPD (chronic obstructive pulmonary disease) ([place]) 03/08/2022  ? Acute metabolic encephalopathy 62/37/6283  ? Anxiety and depression/PTSD/recent SI attempt 03/06/2022  ? Mycobacterium avium infection with cavitory lesion 02/10/2022  ? Cavitary lung disease 02/08/2022  ? Personality disorder ([place]) 01/20/2022  ? Chronic respiratory failure with hypoxia and hypercapnia ([place]) 01/07/2022  ? Hypomagnesemia 01/07/2022  ? Tobacco use 01/07/2022  ? Protein-calorie malnutrition, severe ([place]) 01/07/2022  ? Physical debility 01/07/2022  ? Cavitary pneumonia 01/06/2022  ? Multifocal pneumonia 01/03/2022  ? Severe malnutrition ([place]) 01/03/2022  ? AKI (acute kidney injury) ([place]) 01/03/2022  ? Weakness 12/31/2021  ? Current smoker 11/03/2021  ? Stress due to family tension 09/02/2021  ? Nasal congestion 08/06/2021  ? Medication management  03/15/2021  ? History of Clostridioides difficile colitis 02/22/2021  ? Mild protein-calorie malnutrition ([place]) 01/19/2021  ? Clostridium difficile colitis 09/11/2020  ? Lower extremity edema 07/29/2020  ? Constipation 07/13/2020  ? Body mass index (BMI) of 19 or less in adult 05/08/2020  ? Malnutrition of mild degree ([place]) 05/06/2020  ? Anxiety about health 03/23/2020  ? Pernicious anemia 03/23/2020  ? Macrocytic anemia 03/10/2020  ? Elevated ferritin 03/10/2020  ? Irritable bowel syndrome with diarrhea 03/10/2020  ? Chronic rhinitis 01/31/2020  ? Menopausal vaginal dryness 12/24/2019  ? History of hypokalemia 10/09/2019  ? Leg cramps 05/29/2019  ? Recurrent UTI 05/07/2019  ? DOE (dyspnea on exertion) 05/09/2018  ? High serum parathyroid hormone (PTH) 07/19/2017  ? Normocytic anemia 07/19/2017  ? Low serum vitamin B12 05/14/2017  ? Vitamin D deficiency 05/14/2017  ? Depressed mood 05/14/2017  ? Anemia of chronic disease 05/11/2017  ? Stage 3a chronic kidney disease ([place]) 01/16/2017  ? Iron deficiency anemia 01/16/2017  ? Centrilobular emphysema ([place]) 12/20/2016  ? Hyponatremia 12/19/2016  ? Hyperkalemia 12/19/2016  ? Chronic use of benzodiazepine for therapeutic purpose 08/11/2015  ? Acquired hypothyroidism 11/24/2014  ? BP (high blood pressure) 07/24/2012  ? PTSD (post-traumatic stress disorder) 11/10/2011  ?  Class: Question of  ? Neurosis, anxiety, panic type 11/10/2011  ?  Class: Chronic  ? ?Past Medical History:  ?Diagnosis Date  ? Acid reflux   ? Anxiety   ? Encephalopathy acute   ? High blood pressure   ? High serum parathyroid hormone (PTH) 07/19/2017  ? 96 06/28/2014, 162 12/29/16  ? Hypothyroid   ?  PTSD (post-traumatic stress disorder)   ? ?Allergies  ?Allergen Reactions  ? Bactrim [Sulfamethoxazole-Trimethoprim]   ?  ACUTE RENAL fAILURE/HYPONATREMIA/HYPERKALEMIA  ? Macrodantin [Nitrofurantoin Macrocrystal] Rash  ? Ibuprofen Other (See Comments)  ?  unknown  ? Ivp Dye [Iodinated Contrast Media] Other (See  Comments)  ?  unknown  ? Lexapro [Escitalopram]   ?  hyponatremia  ? Medrol [Methylprednisolone]   ?  Hallucinations  ? Trimethoprim   ?  Fatigue/weakness/no appetite  ? Zithromax [Azithromycin]   ?  diarrhea  ? ? ?ROS ?See HPI.  ?  ?Objective:  ?  ? ?BP 102/67   Pulse 83   SpO2 93%  ?BP Readings from Last 3 Encounters:  ?03/23/22 102/67  ?03/16/22 (!) 150/108  ?03/14/22 123/63  ? ?  ? ?Physical Exam ?Vitals reviewed.  ?Constitutional:   ?   Appearance: She is ill-appearing.  ?   Comments: frail  ?HENT:  ?   Head: Normocephalic.  ?Eyes:  ?   Pupils: Pupils are equal, round, and reactive to light.  ?Cardiovascular:  ?   Rate and Rhythm: Normal rate.  ?   Heart sounds: Murmur heard.  ?Systolic murmur is present with a grade of 3/6.  ?Pulmonary:  ?   Comments: Labored breathing ?Chest:  ?   Chest wall: No mass or edema.  ?Abdominal:  ?   General: Bowel sounds are normal.  ?   Palpations: Abdomen is soft. There is no hepatomegaly or mass.  ?   Tenderness: There is abdominal tenderness. There is no guarding or rebound.  ?Musculoskeletal:  ?   Cervical back: Normal range of motion.  ?   Right lower leg: No tenderness.  ?   Left lower leg: No tenderness.  ?Neurological:  ?   Motor: Weakness present.  ?Psychiatric:     ?   Mood and Affect: Mood is not anxious.  ? ? ? ? ?Last CBC ?Lab Results  ?Component Value Date  ? WBC 11.3 (H) 03/23/2022  ? HGB 8.2 (L) 03/23/2022  ? HCT 25.9 (L) 03/23/2022  ? MCV 96.6 03/23/2022  ? MCH 30.6 03/23/2022  ? RDW 13.5 03/23/2022  ? PLT 482 (H) 03/23/2022  ? ?Last metabolic panel ?Lab Results  ?Component Value Date  ? GLUCOSE 97 03/23/2022  ? NA 129 (L) 03/23/2022  ? K 6.0 (H) 03/23/2022  ? CL 88 (L) 03/23/2022  ? CO2 32 03/23/2022  ? BUN 28 (H) 03/23/2022  ? CREATININE 1.22 (H) 03/23/2022  ? EGFR 47 (L) 03/23/2022  ? CALCIUM 9.3 03/23/2022  ? PROT 7.9 03/16/2022  ? ALBUMIN 2.2 (L) 03/11/2022  ? LABGLOB 3.1 04/27/2021  ? AGRATIO 1.4 04/27/2021  ? BILITOT 0.2 03/16/2022  ? ALKPHOS 52  03/11/2022  ? AST 15 03/16/2022  ? ALT 9 03/16/2022  ? ANIONGAP 5 03/14/2022  ? ?Last thyroid functions ?Lab Results  ?Component Value Date  ? TSH 10.141 (H) 03/06/2022  ? ?  ? ?The 10-year ASCVD risk score (Arnett DK, et al., 2019) is: 13.7% ? ?  ?Assessment & Plan:  ?..Odie was seen today for chest pain. ? ?Diagnoses and all orders for this visit: ? ?Right upper quadrant abdominal pain ?-     US Abdomen Complete; Future ? ?Hyponatremia ?-     CBC w/Diff/Platelet ?-     BASIC METABOLIC PANEL WITH GFR ? ?Right lower quadrant abdominal pain ?-     US Abdomen Complete; Future ? ?Confusion ?-     Urinalysis, Routine w reflex  microscopic ?-     Urine Culture ? ?History of fall ? ?Chronic respiratory failure with hypoxia and hypercapnia ([facility]) ? ?Stage 3b chronic kidney disease ([place]) ? ?Severe malnutrition ([place]) ? ?Centrilobular emphysema ([place]) ? ?Hypokalemia ? ?Cavitary lung disease ? ? ?No red flag abdominal symptoms ?U/S ordered ?Cbc and bmp labs ordered ?Get urine culture to look for UTI ? ?Continue with same pulmonary treatment plan ? ?Mood changes and not on remeron ?Increased zyprexa to 16m daily ? ?Order for palliative care and hospital bed placed ? ?Complex patient with 40 minutes spent in chart review, ordering labs and testing, discussing plan and discussing with daughter and patient.  ? ?Return in about 2 weeks (around 04/06/2022) for Follow up.  ? ? ?JIran Planas PA-C ? ?

## 2022-03-24 LAB — CBC WITH DIFFERENTIAL/PLATELET
Absolute Monocytes: 452 cells/uL (ref 200–950)
Basophils Absolute: 57 cells/uL (ref 0–200)
Basophils Relative: 0.5 %
Eosinophils Absolute: 57 cells/uL (ref 15–500)
Eosinophils Relative: 0.5 %
HCT: 25.9 % — ABNORMAL LOW (ref 35.0–45.0)
Hemoglobin: 8.2 g/dL — ABNORMAL LOW (ref 11.7–15.5)
Lymphs Abs: 904 cells/uL (ref 850–3900)
MCH: 30.6 pg (ref 27.0–33.0)
MCHC: 31.7 g/dL — ABNORMAL LOW (ref 32.0–36.0)
MCV: 96.6 fL (ref 80.0–100.0)
MPV: 8.8 fL (ref 7.5–12.5)
Monocytes Relative: 4 %
Neutro Abs: 9831 cells/uL — ABNORMAL HIGH (ref 1500–7800)
Neutrophils Relative %: 87 %
Platelets: 482 10*3/uL — ABNORMAL HIGH (ref 140–400)
RBC: 2.68 10*6/uL — ABNORMAL LOW (ref 3.80–5.10)
RDW: 13.5 % (ref 11.0–15.0)
Total Lymphocyte: 8 %
WBC: 11.3 10*3/uL — ABNORMAL HIGH (ref 3.8–10.8)

## 2022-03-24 LAB — BASIC METABOLIC PANEL WITH GFR
BUN/Creatinine Ratio: 23 (calc) — ABNORMAL HIGH (ref 6–22)
BUN: 28 mg/dL — ABNORMAL HIGH (ref 7–25)
CO2: 32 mmol/L (ref 20–32)
Calcium: 9.3 mg/dL (ref 8.6–10.4)
Chloride: 88 mmol/L — ABNORMAL LOW (ref 98–110)
Creat: 1.22 mg/dL — ABNORMAL HIGH (ref 0.60–1.00)
Glucose, Bld: 97 mg/dL (ref 65–99)
Potassium: 6 mmol/L — ABNORMAL HIGH (ref 3.5–5.3)
Sodium: 129 mmol/L — ABNORMAL LOW (ref 135–146)
eGFR: 47 mL/min/{1.73_m2} — ABNORMAL LOW (ref >=60)

## 2022-03-24 NOTE — Progress Notes (Signed)
Waiting on urine culture.  ?Kidney function has dropped some from 8 days ago.  ?Your kidneys look dry though. You are not drinking enough.  ?Hemoglobin stable.  ?WBC up a little from 8 days ago.

## 2022-03-25 ENCOUNTER — Telehealth: Payer: Self-pay | Admitting: Neurology

## 2022-03-25 ENCOUNTER — Encounter: Payer: Self-pay | Admitting: Physician Assistant

## 2022-03-25 DIAGNOSIS — R1011 Right upper quadrant pain: Secondary | ICD-10-CM | POA: Insufficient documentation

## 2022-03-25 MED ORDER — AMOXICILLIN-POT CLAVULANATE 500-125 MG PO TABS: 1.0000 | ORAL_TABLET | Freq: Two times a day (BID) | ORAL | 0 refills | Status: DC

## 2022-03-25 NOTE — Telephone Encounter (Signed)
Hospital bed order faxed to [facility] at [phone] with confirmation received.  ?

## 2022-03-28 ENCOUNTER — Inpatient Hospital Stay (HOSPITAL_COMMUNITY)
Admission: EM | Admit: 2022-03-28 | Discharge: 2022-04-01 | DRG: 689 | Disposition: A | Discharge status: Home / Self Care | Payer: Medicare Other | Attending: Family Medicine | Admitting: Family Medicine

## 2022-03-28 ENCOUNTER — Other Ambulatory Visit: Payer: Self-pay

## 2022-03-28 ENCOUNTER — Telehealth: Payer: Self-pay

## 2022-03-28 ENCOUNTER — Telehealth: Payer: Self-pay | Admitting: Neurology

## 2022-03-28 ENCOUNTER — Emergency Department (HOSPITAL_COMMUNITY): Payer: Medicare Other

## 2022-03-28 ENCOUNTER — Encounter (HOSPITAL_COMMUNITY): Payer: Self-pay | Admitting: Emergency Medicine

## 2022-03-28 ENCOUNTER — Encounter: Payer: Self-pay | Admitting: Physician Assistant

## 2022-03-28 DIAGNOSIS — E43 Unspecified severe protein-calorie malnutrition: Secondary | ICD-10-CM | POA: Diagnosis not present

## 2022-03-28 DIAGNOSIS — J9611 Chronic respiratory failure with hypoxia: Secondary | ICD-10-CM | POA: Diagnosis not present

## 2022-03-28 DIAGNOSIS — R918 Other nonspecific abnormal finding of lung field: Secondary | ICD-10-CM | POA: Diagnosis present

## 2022-03-28 DIAGNOSIS — Z881 Allergy status to other antibiotic agents status: Secondary | ICD-10-CM

## 2022-03-28 DIAGNOSIS — E039 Hypothyroidism, unspecified: Secondary | ICD-10-CM | POA: Diagnosis present

## 2022-03-28 DIAGNOSIS — F32A Depression, unspecified: Secondary | ICD-10-CM | POA: Diagnosis not present

## 2022-03-28 DIAGNOSIS — D638 Anemia in other chronic diseases classified elsewhere: Secondary | ICD-10-CM | POA: Diagnosis present

## 2022-03-28 DIAGNOSIS — G9341 Metabolic encephalopathy: Secondary | ICD-10-CM | POA: Diagnosis present

## 2022-03-28 DIAGNOSIS — A310 Pulmonary mycobacterial infection: Secondary | ICD-10-CM | POA: Diagnosis not present

## 2022-03-28 DIAGNOSIS — E871 Hypo-osmolality and hyponatremia: Secondary | ICD-10-CM | POA: Diagnosis present

## 2022-03-28 DIAGNOSIS — F4310 Post-traumatic stress disorder, unspecified: Secondary | ICD-10-CM | POA: Diagnosis present

## 2022-03-28 DIAGNOSIS — Z9981 Dependence on supplemental oxygen: Secondary | ICD-10-CM | POA: Diagnosis not present

## 2022-03-28 DIAGNOSIS — Z66 Do not resuscitate: Secondary | ICD-10-CM | POA: Diagnosis not present

## 2022-03-28 DIAGNOSIS — Z87440 Personal history of urinary (tract) infections: Secondary | ICD-10-CM

## 2022-03-28 DIAGNOSIS — G9340 Encephalopathy, unspecified: Secondary | ICD-10-CM | POA: Diagnosis not present

## 2022-03-28 DIAGNOSIS — Z9151 Personal history of suicidal behavior: Secondary | ICD-10-CM

## 2022-03-28 DIAGNOSIS — I129 Hypertensive chronic kidney disease with stage 1 through stage 4 chronic kidney disease, or unspecified chronic kidney disease: Secondary | ICD-10-CM | POA: Diagnosis present

## 2022-03-28 DIAGNOSIS — R404 Transient alteration of awareness: Secondary | ICD-10-CM | POA: Diagnosis not present

## 2022-03-28 DIAGNOSIS — Z888 Allergy status to other drugs, medicaments and biological substances status: Secondary | ICD-10-CM

## 2022-03-28 DIAGNOSIS — K582 Mixed irritable bowel syndrome: Secondary | ICD-10-CM | POA: Diagnosis not present

## 2022-03-28 DIAGNOSIS — J439 Emphysema, unspecified: Secondary | ICD-10-CM | POA: Diagnosis not present

## 2022-03-28 DIAGNOSIS — J9612 Chronic respiratory failure with hypercapnia: Secondary | ICD-10-CM | POA: Diagnosis present

## 2022-03-28 DIAGNOSIS — F609 Personality disorder, unspecified: Secondary | ICD-10-CM | POA: Diagnosis not present

## 2022-03-28 DIAGNOSIS — E875 Hyperkalemia: Secondary | ICD-10-CM | POA: Diagnosis present

## 2022-03-28 DIAGNOSIS — N390 Urinary tract infection, site not specified: Principal | ICD-10-CM | POA: Diagnosis present

## 2022-03-28 DIAGNOSIS — F410 Panic disorder [episodic paroxysmal anxiety] without agoraphobia: Secondary | ICD-10-CM | POA: Diagnosis not present

## 2022-03-28 DIAGNOSIS — B952 Enterococcus as the cause of diseases classified elsewhere: Secondary | ICD-10-CM | POA: Diagnosis not present

## 2022-03-28 DIAGNOSIS — R4589 Other symptoms and signs involving emotional state: Secondary | ICD-10-CM

## 2022-03-28 DIAGNOSIS — N1831 Chronic kidney disease, stage 3a: Secondary | ICD-10-CM | POA: Diagnosis present

## 2022-03-28 DIAGNOSIS — R627 Adult failure to thrive: Secondary | ICD-10-CM | POA: Diagnosis not present

## 2022-03-28 DIAGNOSIS — I10 Essential (primary) hypertension: Secondary | ICD-10-CM | POA: Diagnosis present

## 2022-03-28 DIAGNOSIS — K219 Gastro-esophageal reflux disease without esophagitis: Secondary | ICD-10-CM | POA: Diagnosis not present

## 2022-03-28 DIAGNOSIS — N179 Acute kidney failure, unspecified: Secondary | ICD-10-CM | POA: Diagnosis not present

## 2022-03-28 DIAGNOSIS — Z886 Allergy status to analgesic agent status: Secondary | ICD-10-CM

## 2022-03-28 DIAGNOSIS — Z681 Body mass index (BMI) 19 or less, adult: Secondary | ICD-10-CM | POA: Diagnosis not present

## 2022-03-28 DIAGNOSIS — F419 Anxiety disorder, unspecified: Secondary | ICD-10-CM | POA: Diagnosis present

## 2022-03-28 DIAGNOSIS — Z743 Need for continuous supervision: Secondary | ICD-10-CM | POA: Diagnosis not present

## 2022-03-28 DIAGNOSIS — K589 Irritable bowel syndrome without diarrhea: Secondary | ICD-10-CM | POA: Diagnosis not present

## 2022-03-28 DIAGNOSIS — Z818 Family history of other mental and behavioral disorders: Secondary | ICD-10-CM

## 2022-03-28 DIAGNOSIS — Z8619 Personal history of other infectious and parasitic diseases: Secondary | ICD-10-CM

## 2022-03-28 DIAGNOSIS — Z882 Allergy status to sulfonamides status: Secondary | ICD-10-CM

## 2022-03-28 DIAGNOSIS — R630 Anorexia: Secondary | ICD-10-CM | POA: Diagnosis present

## 2022-03-28 DIAGNOSIS — J449 Chronic obstructive pulmonary disease, unspecified: Secondary | ICD-10-CM | POA: Diagnosis present

## 2022-03-28 LAB — CBC WITH DIFFERENTIAL/PLATELET
Abs Immature Granulocytes: 0.06 10*3/uL (ref 0.00–0.07)
Basophils Absolute: 0.1 10*3/uL (ref 0.0–0.1)
Basophils Relative: 1 %
Eosinophils Absolute: 0.2 10*3/uL (ref 0.0–0.5)
Eosinophils Relative: 2 %
HCT: 26.4 % — ABNORMAL LOW (ref 36.0–46.0)
Hemoglobin: 7.8 g/dL — ABNORMAL LOW (ref 12.0–15.0)
Immature Granulocytes: 1 %
Lymphocytes Relative: 17 %
Lymphs Abs: 1.9 10*3/uL (ref 0.7–4.0)
MCH: 31.1 pg (ref 26.0–34.0)
MCHC: 29.5 g/dL — ABNORMAL LOW (ref 30.0–36.0)
MCV: 105.2 fL — ABNORMAL HIGH (ref 80.0–100.0)
Monocytes Absolute: 0.9 10*3/uL (ref 0.1–1.0)
Monocytes Relative: 8 %
Neutro Abs: 8.1 10*3/uL — ABNORMAL HIGH (ref 1.7–7.7)
Neutrophils Relative %: 71 %
Platelets: 480 10*3/uL — ABNORMAL HIGH (ref 150–400)
RBC: 2.51 MIL/uL — ABNORMAL LOW (ref 3.87–5.11)
RDW: 15.4 % (ref 11.5–15.5)
WBC: 11.2 10*3/uL — ABNORMAL HIGH (ref 4.0–10.5)
nRBC: 0 % (ref 0.0–0.2)

## 2022-03-28 LAB — COMPREHENSIVE METABOLIC PANEL
ALT: 12 U/L (ref 0–44)
AST: 17 U/L (ref 15–41)
Albumin: 2.6 g/dL — ABNORMAL LOW (ref 3.5–5.0)
Alkaline Phosphatase: 89 U/L (ref 38–126)
Anion gap: 7 (ref 5–15)
BUN: 29 mg/dL — ABNORMAL HIGH (ref 8–23)
CO2: 33 mmol/L — ABNORMAL HIGH (ref 22–32)
Calcium: 9.8 mg/dL (ref 8.9–10.3)
Chloride: 91 mmol/L — ABNORMAL LOW (ref 98–111)
Creatinine, Ser: 1.08 mg/dL — ABNORMAL HIGH (ref 0.44–1.00)
GFR, Estimated: 55 mL/min — ABNORMAL LOW (ref >60)
Glucose, Bld: 106 mg/dL — ABNORMAL HIGH (ref 70–99)
Potassium: 6.1 mmol/L — ABNORMAL HIGH (ref 3.5–5.1)
Sodium: 131 mmol/L — ABNORMAL LOW (ref 135–145)
Total Bilirubin: 0.4 mg/dL (ref 0.3–1.2)
Total Protein: 9.4 g/dL — ABNORMAL HIGH (ref 6.5–8.1)

## 2022-03-28 LAB — URINALYSIS, ROUTINE W REFLEX MICROSCOPIC
Bilirubin Urine: NEGATIVE
Glucose, UA: NEGATIVE mg/dL
Hgb urine dipstick: NEGATIVE
Ketones, ur: NEGATIVE mg/dL
Leukocytes,Ua: NEGATIVE
Nitrite: NEGATIVE
Protein, ur: NEGATIVE mg/dL
Specific Gravity, Urine: 1.014 (ref 1.005–1.030)
pH: 5 (ref 5.0–8.0)

## 2022-03-28 LAB — URINE CULTURE
MICRO NUMBER:: 13296034
SPECIMEN QUALITY:: ADEQUATE

## 2022-03-28 LAB — I-STAT CHEM 8, ED
BUN: 34 mg/dL — ABNORMAL HIGH (ref 8–23)
Calcium, Ion: 1.24 mmol/L (ref 1.15–1.40)
Chloride: 92 mmol/L — ABNORMAL LOW (ref 98–111)
Creatinine, Ser: 1.1 mg/dL — ABNORMAL HIGH (ref 0.44–1.00)
Glucose, Bld: 101 mg/dL — ABNORMAL HIGH (ref 70–99)
HCT: 28 % — ABNORMAL LOW (ref 36.0–46.0)
Hemoglobin: 9.5 g/dL — ABNORMAL LOW (ref 12.0–15.0)
Potassium: 5.5 mmol/L — ABNORMAL HIGH (ref 3.5–5.1)
Sodium: 133 mmol/L — ABNORMAL LOW (ref 135–145)
TCO2: 35 mmol/L — ABNORMAL HIGH (ref 22–32)

## 2022-03-28 LAB — LACTIC ACID, PLASMA: Lactic Acid, Venous: 0.8 mmol/L (ref 0.5–1.9)

## 2022-03-28 MED ORDER — SODIUM BICARBONATE 8.4 % IV SOLN
50.0000 meq | Freq: Once | INTRAVENOUS | Status: AC
Start: 2022-03-28 — End: 2022-03-28
  Administered 2022-03-28: 50 meq via INTRAVENOUS
  Filled 2022-03-28: qty 50

## 2022-03-28 MED ORDER — LORAZEPAM 2 MG/ML IJ SOLN
1.0000 mg | Freq: Once | INTRAMUSCULAR | Status: AC
  Administered 2022-03-28: 1 mg via INTRAVENOUS
  Filled 2022-03-28: qty 1

## 2022-03-28 MED ORDER — HALOPERIDOL LACTATE 5 MG/ML IJ SOLN
5.0000 mg | Freq: Once | INTRAMUSCULAR | Status: AC
  Administered 2022-03-28: 5 mg via INTRAVENOUS
  Filled 2022-03-28: qty 1

## 2022-03-28 MED ORDER — VANCOMYCIN HCL IN DEXTROSE 1-5 GM/200ML-% IV SOLN
1000.0000 mg | Freq: Once | INTRAVENOUS | Status: AC
  Administered 2022-03-28: 1000 mg via INTRAVENOUS
  Filled 2022-03-28: qty 200

## 2022-03-28 MED ORDER — ALBUTEROL SULFATE (2.5 MG/3ML) 0.083% IN NEBU: 2.5000 mg | INHALATION_SOLUTION | RESPIRATORY_TRACT | Status: DC | PRN

## 2022-03-28 MED ORDER — POLYETHYLENE GLYCOL 3350 17 G PO PACK: 17.0000 g | PACK | Freq: Every day | ORAL | Status: DC | PRN

## 2022-03-28 MED ORDER — DIPHENOXYLATE-ATROPINE 2.5-0.025 MG PO TABS: 1.0000 | ORAL_TABLET | Freq: Four times a day (QID) | ORAL | Status: DC | PRN

## 2022-03-28 MED ORDER — ENSURE ENLIVE PO LIQD
237.0000 mL | Freq: Three times a day (TID) | ORAL | Status: DC
  Administered 2022-03-29 – 2022-03-31 (×3): 237 mL via ORAL
  Filled 2022-03-28: qty 237

## 2022-03-28 MED ORDER — THIAMINE HCL 100 MG PO TABS
100.0000 mg | ORAL_TABLET | Freq: Every day | ORAL | Status: DC
  Administered 2022-03-29 – 2022-04-01 (×4): 100 mg via ORAL
  Filled 2022-03-28 (×4): qty 1

## 2022-03-28 MED ORDER — FAMOTIDINE 20 MG PO TABS
20.0000 mg | ORAL_TABLET | Freq: Two times a day (BID) | ORAL | Status: DC
  Administered 2022-03-28 – 2022-04-01 (×7): 20 mg via ORAL
  Filled 2022-03-28 (×8): qty 1

## 2022-03-28 MED ORDER — UMECLIDINIUM BROMIDE 62.5 MCG/ACT IN AEPB
1.0000 | INHALATION_SPRAY | Freq: Every day | RESPIRATORY_TRACT | Status: DC
  Administered 2022-03-29 – 2022-03-31 (×3): 1 via RESPIRATORY_TRACT
  Filled 2022-03-28: qty 7

## 2022-03-28 MED ORDER — SODIUM CHLORIDE 0.9 % IV SOLN
2.0000 g | Freq: Two times a day (BID) | INTRAVENOUS | Status: DC
  Administered 2022-03-28 – 2022-03-30 (×4): 2 g via INTRAVENOUS
  Filled 2022-03-28 (×4): qty 12.5

## 2022-03-28 MED ORDER — HALOPERIDOL LACTATE 5 MG/ML IJ SOLN
5.0000 mg | Freq: Once | INTRAMUSCULAR | Status: DC
Start: 2022-03-28 — End: 2022-03-28
  Filled 2022-03-28: qty 1

## 2022-03-28 MED ORDER — ACETAMINOPHEN 650 MG RE SUPP: 650.0000 mg | Freq: Four times a day (QID) | RECTAL | Status: DC | PRN

## 2022-03-28 MED ORDER — LEVOTHYROXINE SODIUM 75 MCG PO TABS
75.0000 ug | ORAL_TABLET | Freq: Every day | ORAL | Status: DC
  Administered 2022-03-29 – 2022-04-01 (×4): 75 ug via ORAL
  Filled 2022-03-28 (×4): qty 1

## 2022-03-28 MED ORDER — SODIUM CHLORIDE 0.9 % IV BOLUS
1000.0000 mL | Freq: Once | INTRAVENOUS | Status: AC
  Administered 2022-03-28: 1000 mL via INTRAVENOUS

## 2022-03-28 MED ORDER — OLANZAPINE 2.5 MG PO TABS
5.0000 mg | ORAL_TABLET | Freq: Every day | ORAL | Status: DC
Start: 2022-03-29 — End: 2022-03-31
  Administered 2022-03-30: 5 mg via ORAL
  Filled 2022-03-28 (×2): qty 2
  Filled 2022-03-28: qty 1

## 2022-03-28 MED ORDER — FERROUS SULFATE 325 (65 FE) MG PO TABS
325.0000 mg | ORAL_TABLET | Freq: Every day | ORAL | Status: DC
  Administered 2022-03-29 – 2022-04-01 (×4): 325 mg via ORAL
  Filled 2022-03-28 (×4): qty 1

## 2022-03-28 MED ORDER — CLONAZEPAM 0.5 MG PO TABS
0.5000 mg | ORAL_TABLET | Freq: Three times a day (TID) | ORAL | Status: DC
  Administered 2022-03-28 – 2022-04-01 (×10): 0.5 mg via ORAL
  Filled 2022-03-28 (×12): qty 1

## 2022-03-28 MED ORDER — SODIUM CHLORIDE 0.9% FLUSH
3.0000 mL | Freq: Two times a day (BID) | INTRAVENOUS | Status: DC
  Administered 2022-03-29 – 2022-03-31 (×5): 3 mL via INTRAVENOUS

## 2022-03-28 MED ORDER — VANCOMYCIN HCL 750 MG/150ML IV SOLN
750.0000 mg | INTRAVENOUS | Status: DC
  Administered 2022-03-30: 750 mg via INTRAVENOUS
  Filled 2022-03-28: qty 150

## 2022-03-28 MED ORDER — OLANZAPINE 2.5 MG PO TABS: 2.5000 mg | ORAL_TABLET | Freq: Every day | ORAL | Status: DC

## 2022-03-28 MED ORDER — MOMETASONE FURO-FORMOTEROL FUM 100-5 MCG/ACT IN AERO
2.0000 | INHALATION_SPRAY | Freq: Two times a day (BID) | RESPIRATORY_TRACT | Status: DC
  Administered 2022-03-29 – 2022-03-31 (×5): 2 via RESPIRATORY_TRACT
  Filled 2022-03-28: qty 8.8

## 2022-03-28 MED ORDER — ENOXAPARIN SODIUM 30 MG/0.3ML IJ SOSY
30.0000 mg | PREFILLED_SYRINGE | Freq: Every day | INTRAMUSCULAR | Status: DC
  Administered 2022-03-29 – 2022-03-30 (×2): 30 mg via SUBCUTANEOUS
  Filled 2022-03-28 (×2): qty 0.3

## 2022-03-28 MED ORDER — SACCHAROMYCES BOULARDII 250 MG PO CAPS
250.0000 mg | ORAL_CAPSULE | Freq: Every day | ORAL | Status: DC
  Administered 2022-03-29 – 2022-04-01 (×4): 250 mg via ORAL
  Filled 2022-03-28 (×4): qty 1

## 2022-03-28 MED ORDER — ACETAMINOPHEN 325 MG PO TABS
650.0000 mg | ORAL_TABLET | Freq: Four times a day (QID) | ORAL | Status: DC | PRN
  Administered 2022-04-01: 650 mg via ORAL
  Filled 2022-03-28: qty 2

## 2022-03-28 MED ORDER — SODIUM ZIRCONIUM CYCLOSILICATE 10 G PO PACK
10.0000 g | PACK | Freq: Once | ORAL | Status: AC
  Administered 2022-03-28: 10 g via ORAL
  Filled 2022-03-28: qty 1

## 2022-03-28 NOTE — Progress Notes (Addendum)
Pharmacy Antibiotic Note ? ?Claire [name] is a 71 y.o. female admitted on 03/28/2022 with pneumonia.  Pharmacy has been consulted for cefepime dosing. ? ?WBC and SCr mildly elevated  ? ?Plan: ?-Cefepime 2 gm IV Q 12 hours ?-F/u if maintenance vancomycin dosing is needed ?-Monitor CBC, renal fx, cultures and clinical progress ? ?  ? ?Temp (24hrs), Avg:98.3 ?F (36.8 ?C), Min:98.3 ?F (36.8 ?C), Max:98.3 ?F (36.8 ?C) ? ?Recent Labs  ?Lab 03/23/22 ?0000 03/28/22 ?1647  ?WBC 11.3* 11.2*  ?CREATININE 1.22* 1.08*  ?LATICACIDVEN  --  0.8  ?  ?Estimated Creatinine Clearance: 29.4 mL/min (A) (by C-G formula based on SCr of 1.08 mg/dL (H)).   ? ?Allergies  ?Allergen Reactions  ? Bactrim [Sulfamethoxazole-Trimethoprim]   ?  ACUTE RENAL fAILURE/HYPONATREMIA/HYPERKALEMIA  ? Macrodantin [Nitrofurantoin Macrocrystal] Rash  ? Ibuprofen Other (See Comments)  ?  unknown  ? Ivp Dye [Iodinated Contrast Media] Other (See Comments)  ?  unknown  ? Lexapro [Escitalopram]   ?  hyponatremia  ? Medrol [Methylprednisolone]   ?  Hallucinations  ? Trimethoprim   ?  Fatigue/weakness/no appetite  ? Zithromax [Azithromycin]   ?  diarrhea  ? ? ?Antimicrobials this admission: ?Cefepime 4/24 >>  ?Vancomycin 4/24 >>  ? ?Dose adjustments this admission: ? ? ?Microbiology results: ?4/24 BCx:  ?4/24 UCx:   ? ? ?Thank you for allowing pharmacy to be a part of this patient?s care. ? ?Vinnie Level, PharmD., BCCCP ?Clinical Pharmacist ?Please refer to [facility] for unit-specific pharmacist  ? ?Addendum: Now adding vancomycin. Will start Vancomycin 750 mg IV Q 48 hrs. Goal AUC 400-550. ?Expected AUC: 470 ?SCr used: 1.1 ?-Monitor vanc levels as necessary  ? ?Vinnie Level, PharmD., BCCCP ?Clinical Pharmacist ?Please refer to [facility] for unit-specific pharmacist  ? ? ? ?

## 2022-03-28 NOTE — ED Provider Notes (Signed)
?[place] ?Provider Note ? ? ?CSN: [id number] ?Arrival date & time: 03/28/22  1608 ? ?  ? ?History ? ?Chief Complaint  ?Patient presents with  ? UTI  ? ? ?Claire [name] is a 71 y.o. female history of hypoxic chronic respiratory failure, COPD, cavitary MAC, anxiety here presenting with weakness and hyperkalemia.  She was recently admitted to the hospital for encephalopathy secondary to pneumonia.  Patient has been having persistent cough.  Patient went to PCP office last week and had urinalysis that showed UTI.  Urine culture grew out E faecalis.  Patient apparently cannot take nitrofurantoin and was told that she needs IV antibiotics.  Patient does have progressive worsening weakness over the last several days despite antibiotics.  Patient has no falls or fevers.  Patient is on 2 L nasal cannula chronically. ? ?The history is provided by the patient and a relative.  ? ?  ? ?Home Medications ?Prior to Admission medications   ?Medication Sig Start Date End Date Taking? Authorizing Provider  ?acetaminophen (TYLENOL) 500 MG tablet Take 1,000 mg by mouth every 6 (six) hours as needed for moderate pain.    [provider]  ?albuterol (VENTOLIN HFA) 108 (90 Base) MCG/ACT inhaler INHALE 1-2 PUFFS INTO THE LUNGS EVERY 2 HOURS AS NEEDED FOR WHEEZING OR SHORTNESS OF BREATH. ?Patient taking differently: Inhale 1-2 puffs into the lungs every 2 (two) hours as needed for wheezing or shortness of breath. 01/04/22   Donella Stade, PA-C  ?AMBULATORY NON FORMULARY MEDICATION Continuous stationary and portable oxygen tanks for use with ambulation. DX: COPD with hypoxia. 07/16/18   Donella Stade, PA-C  ?AMBULATORY NON FORMULARY MEDICATION Medication Name: full face mask to use with oxygen 08/25/21   Donella Stade, PA-C  ?AMBULATORY NON FORMULARY MEDICATION Full face mask for night time O2 ?DX: J96.11 01/20/22   Donella Stade, PA-C  ?AMBULATORY NON FORMULARY MEDICATION Medication  Name: Bipap and supplies.  Inspiratory pressure: 10,  Expiratory pressure: 5 03/16/22   Hali Marry, MD  ?amoxicillin-clavulanate (AUGMENTIN) 500-125 MG tablet Take 1 tablet (500 mg total) by mouth 2 (two) times daily. 03/25/22   Breeback, Royetta Car, PA-C  ?atenolol (TENORMIN) 25 MG tablet Take 1 tablet (25 mg total) by mouth 2 (two) times daily. 03/16/22   Donella Stade, PA-C  ?Budesonide ER 9 MG TB24 Take 9 mg by mouth daily. 02/04/22   [provider]  ?cetirizine (ZYRTEC) 10 MG chewable tablet Chew 10 mg by mouth daily as needed for allergies.    [provider]  ?cholecalciferol (VITAMIN D) 25 MCG (1000 UT) tablet Take 1,000 Units by mouth daily.    [provider]  ?clonazePAM (KLONOPIN) 0.5 MG tablet Take 1 tablet (0.5 mg total) by mouth 3 (three) times daily. 03/14/22   Lavina Hamman, MD  ?dicyclomine (BENTYL) 10 MG capsule Take 10-20 mg by mouth 3 (three) times daily before meals.    [provider]  ?diphenoxylate-atropine (LOMOTIL) 2.5-0.025 MG tablet Take 1 tablet by mouth 4 (four) times daily as needed for diarrhea or loose stools. 03/16/22   Hali Marry, MD  ?estradiol (ESTRACE) 0.1 MG/GM vaginal cream PLACE ONE APPLICATOR FULL THREE TIMES A WEEK AND ONE PEA SIZED AMOUNT APPLIED TO URETHRAL OPENING ?Patient taking differently: Place 1 Applicatorful vaginally 3 (three) times a week. 11/12/21   Breeback, Jade L, PA-C  ?famotidine (PEPCID) 20 MG tablet TAKE 1 TABLET BY MOUTH TWICE A DAY ?Patient  taking differently: Take 20 mg by mouth daily as needed for heartburn or indigestion. 07/01/19   Breeback, Royetta Car, PA-C  ?feeding supplement (ENSURE ENLIVE / ENSURE PLUS) LIQD Take 237 mLs by mouth 3 (three) times daily between meals. 03/14/22   Lavina Hamman, MD  ?ferrous sulfate 325 (65 FE) MG EC tablet 1 tab PO TID every other day ?Patient taking differently: Take 325 mg by mouth daily with breakfast. 1 tablet daily 07/13/17   Trixie Dredge, PA-C   ?ipratropium-albuterol (DUONEB) 0.5-2.5 (3) MG/3ML SOLN Take 3 mLs by nebulization every 4 (four) hours as needed (wheeze, SOB). 01/10/22 03/16/22  Pokhrel, Corrie Mckusick, MD  ?levothyroxine (SYNTHROID) 75 MCG tablet Take 1 tablet (75 mcg total) by mouth daily. 11/03/21   Breeback, Royetta Car, PA-C  ?mirtazapine (REMERON) 7.5 MG tablet Take 1 tablet (7.5 mg total) by mouth at bedtime. ?Patient not taking: Reported on 03/23/2022 03/18/22   Merian Capron, MD  ?mometasone-formoterol [facility]) 100-5 MCG/ACT AERO Inhale 2 puffs into the lungs 2 (two) times daily.    [provider]  ?Nystatin (GERHARDT'S BUTT CREAM) CREA Apply 1 application. topically 3 (three) times daily. 03/14/22   Lavina Hamman, MD  ?OLANZapine (ZYPREXA) 2.5 MG tablet Take 1 tablet (2.5 mg total) by mouth at bedtime. 03/16/22   Hali Marry, MD  ?saccharomyces boulardii (FLORASTOR) 250 MG capsule Take 1 capsule (250 mg total) by mouth 2 (two) times daily. ?Patient taking differently: Take 250 mg by mouth daily. 03/14/22   Lavina Hamman, MD  ?thiamine 100 MG tablet Take 1 tablet (100 mg total) by mouth daily. 03/16/22   Hali Marry, MD  ?Tiotropium Bromide Monohydrate (SPIRIVA RESPIMAT) 2.5 MCG/ACT AERS Inhale 2 puffs into the lungs daily.    [provider]  ?   ? ?Allergies    ?Bactrim [sulfamethoxazole-trimethoprim], Macrodantin [nitrofurantoin macrocrystal], Ibuprofen, Ivp dye [iodinated contrast media], Lexapro [escitalopram], Medrol [methylprednisolone], Trimethoprim, and Zithromax [azithromycin]   ? ?Review of Systems   ?Review of Systems  ?Psychiatric/Behavioral:  Positive for confusion.   ?All other systems reviewed and are negative. ? ?Physical Exam ?Updated Vital Signs ?BP 104/60 (BP Location: Right Arm)   Pulse 86   Temp 98.3 ?F (36.8 ?C) (Oral)   Resp (!) 28   SpO2 94%  ?Physical Exam ?Vitals and nursing note reviewed.  ?Constitutional:   ?   Comments: Chronically ill and on oxygen  ?HENT:  ?   Head:  Normocephalic.  ?   Nose: Nose normal.  ?   Mouth/Throat:  ?   Mouth: Mucous membranes are moist.  ?Eyes:  ?   Extraocular Movements: Extraocular movements intact.  ?   Pupils: Pupils are equal, round, and reactive to light.  ?Cardiovascular:  ?   Rate and Rhythm: Normal rate and regular rhythm.  ?   Pulses: Normal pulses.  ?   Heart sounds: Normal heart sounds.  ?Pulmonary:  ?   Comments: Crackles bilaterally ?Abdominal:  ?   General: Abdomen is flat.  ?   Palpations: Abdomen is soft.  ?Musculoskeletal:     ?   General: Normal range of motion.  ?   Cervical back: Normal range of motion and neck supple.  ?Skin: ?   General: Skin is warm.  ?   Capillary Refill: Capillary refill takes less than 2 seconds.  ?Neurological:  ?   Mental Status: She is alert.  ?   Comments: ANO x2.  Patient is moving all extremities  ?Psychiatric:     ?  Mood and Affect: Mood normal.     ?   Behavior: Behavior normal.  ? ? ?ED Results / Procedures / Treatments   ?Labs ?(all labs ordered are listed, but only abnormal results are displayed) ?Labs Reviewed  ?COMPREHENSIVE METABOLIC PANEL - Abnormal; Notable for the following components:  ?    Result Value  ? Sodium 131 (*)   ? Potassium 6.1 (*)   ? Chloride 91 (*)   ? CO2 33 (*)   ? Glucose, Bld 106 (*)   ? BUN 29 (*)   ? Creatinine, Ser 1.08 (*)   ? Total Protein 9.4 (*)   ? Albumin 2.6 (*)   ? GFR, Estimated 55 (*)   ? All other components within normal limits  ?CBC WITH DIFFERENTIAL/PLATELET - Abnormal; Notable for the following components:  ? WBC 11.2 (*)   ? RBC 2.51 (*)   ? Hemoglobin 7.8 (*)   ? HCT 26.4 (*)   ? MCV 105.2 (*)   ? MCHC 29.5 (*)   ? Platelets 480 (*)   ? Neutro Abs 8.1 (*)   ? All other components within normal limits  ?URINE CULTURE  ?CULTURE, BLOOD (ROUTINE X 2)  ?CULTURE, BLOOD (ROUTINE X 2)  ?URINALYSIS, ROUTINE W REFLEX MICROSCOPIC  ?LACTIC ACID, PLASMA  ?LACTIC ACID, PLASMA  ? ? ?EKG ?None ? ?Radiology ?No results found. ? ?Procedures ?Procedures  ? ? ?CRITICAL  CARE ?Performed by: Wandra Arthurs ? ? ?Total critical care time: 30 minutes ? ?Critical care time was exclusive of separately billable procedures and treating other patients. ? ?Critical care was necessary to treat or preven

## 2022-03-28 NOTE — Progress Notes (Signed)
Lowry Ram patient daughter and made her aware to take to ED for UTI and confusion. She agrees to take patient right now.

## 2022-03-28 NOTE — ED Provider Triage Note (Signed)
Emergency Medicine Provider Triage Evaluation Note ? ?Claire [name] , a 71 y.o. female  was evaluated in triage.  Pt complains of urinary frequency concern for urinary tract infection.  Per chart review patient's daughter contacted PCP today due to concerns for patient's mother becoming confused in the setting of urinary tract infection despite taking Augmentin since Friday. ? ?Patient is alert to person and place.  Patient reports that she is on oxygen consistently.  Patient endorses urinary frequency denies any other complaints. ? ?Review of Systems  ?Positive: Urinary frequency, confusion ?Negative: Abdominal pain, nausea, vomiting, diarrhea, dysuria, hematuria, fever, chills ? ?Unable to obtain due to confusion ? ?Physical Exam  ?BP 94/61   Pulse 99   Temp 98.3 ?F (36.8 ?C) (Oral)   Resp 16   SpO2 100%  ?Gen:   Awake, no distress   ?Resp:  Normal effort  ?MSK:   Moves extremities without difficulty  ?Other:  Abdomen soft, nondistended, nontender no guarding rebound tenderness.  Patient is on 2 LPM of O2 via nasal cannula. ? ?Medical Decision Making  ?Medically screening exam initiated at 4:29 PM.  Appropriate orders placed.  Claire [name] was informed that the remainder of the evaluation will be completed by another provider, this initial triage assessment does not replace that evaluation, and the importance of remaining in the ED until their evaluation is complete. ? ? ?  ?Haskel Schroeder, PA-C ?03/28/22 1632 ? ?

## 2022-03-28 NOTE — ED Triage Notes (Signed)
Patient BIB EMS from home, called out for dizziness and possible UTI. Pt family wanted transported for possible UTI. Pt A&Ox4. NAD. ?

## 2022-03-28 NOTE — H&P (Addendum)
History and Physical   Claire [name] UJW:[id number] DOB: [birth year] DOA: 03/28/2022  PCP: Jomarie Longs, PA-C   Patient coming from: Home  Chief Complaint: Encephalopathy  HPI: Claire [name] is a 71 y.o. female with medical history significant of PTSD, anxiety, depression, personality sorter, chronic respiratory failure, COPD, anemia, hypothyroidism, MAC infection with cavitary lesion, CKD 3, hypertension presenting with altered mentation.  History obtained with assistance of chart review and family due to patient's altered no status.  Patient has had some recent weakness was evaluated at PCP office found to have UTI growing E faecalis.  Had been started on Augmentin with some initial improvement and then became worse again with significant confusion and weakness with falls.  Of note patient was recently admitted earlier this month with encephalopathy, AKI, pneumonia/sepsis with encephali at that time believed to be secondary to infection and exacerbation of her psychiatric conditions.  Patient denies fevers, chills, chest pain, shortness of breath, abdominal pain, constipation, diarrhea, nausea, long.  ED Course: Vital signs in the ED significant for blood pressure in the 90s to 100 systolic on chronic 2 L.  Lab work-up showed CMP with sodium 131, potassium 6.1, chloride 91, bicarb 33, BUN 29, creatinine stable at 1.08, glucose 106, protein 9.4, albumin 2.6.  CBC with hemoglobin of 7.8 which is stable, platelets of 480 which is stable, leukocytosis to 11.2.  Lactic acid normal with repeat pending.  Urinalysis appears normal, urine culture and blood cultures pending.  Chest x-ray showing multifocal opacities of the left upper and left lower lobe consistent with pneumonia or atelectasis but is stable to previous.  Patient started on vancomycin and cefepime in the ED.  Also received Ativan and a dose of Haldol has been ordered due to agitation.  Patient given a dose of Lokelma and bicarb for  hyperkalemia and also given a liter of IV fluids.  Review of Systems: As per HPI otherwise all other systems reviewed and are negative  Past Medical History:  Diagnosis Date   Acid reflux    Anxiety    Encephalopathy acute    High blood pressure    High serum parathyroid hormone (PTH) 07/19/2017   96 06/28/2014, 162 12/29/16   Hypothyroid    PTSD (post-traumatic stress disorder)     History reviewed. No pertinent surgical history.  Social History  reports that she has quit smoking. Her smoking use included cigarettes. She has a 55.00 pack-year smoking history. She has never used smokeless tobacco. She reports that she does not drink alcohol and does not use drugs.  Allergies  Allergen Reactions   Bactrim [Sulfamethoxazole-Trimethoprim]     ACUTE RENAL fAILURE/HYPONATREMIA/HYPERKALEMIA   Macrodantin [Nitrofurantoin Macrocrystal] Rash   Ibuprofen Other (See Comments)    unknown   Ivp Dye [Iodinated Contrast Media] Other (See Comments)    unknown   Lexapro [Escitalopram]     hyponatremia   Medrol [Methylprednisolone]     Hallucinations   Trimethoprim     Fatigue/weakness/no appetite   Zithromax [Azithromycin]     diarrhea    Family History  Problem Relation Age of Onset   Anxiety disorder Mother    OCD Daughter    Alcohol abuse Father    Anxiety disorder Sister   Reviewed on admission  Prior to Admission medications   Medication Sig Start Date End Date Taking? Authorizing Provider  acetaminophen (TYLENOL) 500 MG tablet Take 1,000 mg by mouth every 6 (six) hours as needed for moderate pain.    [provider]  albuterol (VENTOLIN HFA) 108 (90 Base) MCG/ACT inhaler INHALE 1-2 PUFFS INTO THE LUNGS EVERY 2 HOURS AS NEEDED FOR WHEEZING OR SHORTNESS OF BREATH. Patient taking differently: Inhale 1-2 puffs into the lungs every 2 (two) hours as needed for wheezing or shortness of breath. 01/04/22   Breeback, Jade L, PA-C  AMBULATORY NON FORMULARY MEDICATION Continuous  stationary and portable oxygen tanks for use with ambulation. DX: COPD with hypoxia. 07/16/18   Breeback, Lonna Cobb, PA-C  AMBULATORY NON FORMULARY MEDICATION Medication Name: full face mask to use with oxygen 08/25/21   Breeback, Jade L, PA-C  AMBULATORY NON FORMULARY MEDICATION Full face mask for night time O2 DX: J96.11 01/20/22   Breeback, Jade L, PA-C  AMBULATORY NON FORMULARY MEDICATION Medication Name: Bipap and supplies.  Inspiratory pressure: 10,  Expiratory pressure: 5 03/16/22   Agapito Games, MD  amoxicillin-clavulanate (AUGMENTIN) 500-125 MG tablet Take 1 tablet (500 mg total) by mouth 2 (two) times daily. 03/25/22   Breeback, Jade L, PA-C  atenolol (TENORMIN) 25 MG tablet Take 1 tablet (25 mg total) by mouth 2 (two) times daily. 03/16/22   Breeback, Jade L, PA-C  Budesonide ER 9 MG TB24 Take 9 mg by mouth daily. 02/04/22   [provider]  cetirizine (ZYRTEC) 10 MG chewable tablet Chew 10 mg by mouth daily as needed for allergies.    [provider]  cholecalciferol (VITAMIN D) 25 MCG (1000 UT) tablet Take 1,000 Units by mouth daily.    [provider]  clonazePAM (KLONOPIN) 0.5 MG tablet Take 1 tablet (0.5 mg total) by mouth 3 (three) times daily. 03/14/22   Rolly Salter, MD  dicyclomine (BENTYL) 10 MG capsule Take 10-20 mg by mouth 3 (three) times daily before meals.    [provider]  diphenoxylate-atropine (LOMOTIL) 2.5-0.025 MG tablet Take 1 tablet by mouth 4 (four) times daily as needed for diarrhea or loose stools. 03/16/22   Agapito Games, MD  estradiol (ESTRACE) 0.1 MG/GM vaginal cream PLACE ONE APPLICATOR FULL THREE TIMES A WEEK AND ONE PEA SIZED AMOUNT APPLIED TO URETHRAL OPENING Patient taking differently: Place 1 Applicatorful vaginally 3 (three) times a week. 11/12/21   Breeback, Jade L, PA-C  famotidine (PEPCID) 20 MG tablet TAKE 1 TABLET BY MOUTH TWICE A DAY Patient taking differently: Take 20 mg by mouth daily as needed for  heartburn or indigestion. 07/01/19   Breeback, Jade L, PA-C  feeding supplement (ENSURE ENLIVE / ENSURE PLUS) LIQD Take 237 mLs by mouth 3 (three) times daily between meals. 03/14/22   Rolly Salter, MD  ferrous sulfate 325 (65 FE) MG EC tablet 1 tab PO TID every other day Patient taking differently: Take 325 mg by mouth daily with breakfast. 1 tablet daily 07/13/17   Carlis Stable, PA-C  ipratropium-albuterol (DUONEB) 0.5-2.5 (3) MG/3ML SOLN Take 3 mLs by nebulization every 4 (four) hours as needed (wheeze, SOB). 01/10/22 03/16/22  Pokhrel, Rebekah Chesterfield, MD  levothyroxine (SYNTHROID) 75 MCG tablet Take 1 tablet (75 mcg total) by mouth daily. 11/03/21   Breeback, Jade L, PA-C  mirtazapine (REMERON) 7.5 MG tablet Take 1 tablet (7.5 mg total) by mouth at bedtime. Patient not taking: Reported on 03/23/2022 03/18/22   Thresa Ross, MD  mometasone-formoterol [facility]) 100-5 MCG/ACT AERO Inhale 2 puffs into the lungs 2 (two) times daily.    [provider]  Nystatin (GERHARDT'S BUTT CREAM) CREA Apply 1 application. topically 3 (three) times daily. 03/14/22   Lynden Oxford  M, MD  OLANZapine (ZYPREXA) 2.5 MG tablet Take 1 tablet (2.5 mg total) by mouth at bedtime. 03/16/22   Agapito Games, MD  saccharomyces boulardii (FLORASTOR) 250 MG capsule Take 1 capsule (250 mg total) by mouth 2 (two) times daily. Patient taking differently: Take 250 mg by mouth daily. 03/14/22   Rolly Salter, MD  thiamine 100 MG tablet Take 1 tablet (100 mg total) by mouth daily. 03/16/22   Agapito Games, MD  Tiotropium Bromide Monohydrate (SPIRIVA RESPIMAT) 2.5 MCG/ACT AERS Inhale 2 puffs into the lungs daily.    [provider]    Physical Exam: Vitals:   03/28/22 1623 03/28/22 2018 03/28/22 2130  BP: 94/61 104/60 123/69  Pulse: 99 86 91  Resp: 16 (!) 28 20  Temp: 98.3 F (36.8 C)    TempSrc: Oral    SpO2: 100% 94% 95%    Physical Exam Constitutional:      General: She is not in acute  distress.    Appearance: Normal appearance.     Comments: Thin elderly female.  HENT:     Head: Normocephalic and atraumatic.     Mouth/Throat:     Mouth: Mucous membranes are moist.     Pharynx: Oropharynx is clear.  Eyes:     Extraocular Movements: Extraocular movements intact.     Pupils: Pupils are equal, round, and reactive to light.  Cardiovascular:     Rate and Rhythm: Normal rate and regular rhythm.     Pulses: Normal pulses.     Heart sounds: Normal heart sounds.  Pulmonary:     Effort: Pulmonary effort is normal. No respiratory distress.     Breath sounds: Normal breath sounds.  Abdominal:     General: Bowel sounds are normal. There is no distension.     Palpations: Abdomen is soft.     Tenderness: There is no abdominal tenderness.  Musculoskeletal:        General: No swelling or deformity.  Skin:    General: Skin is warm and dry.  Neurological:     General: No focal deficit present.     Mental Status: Mental status is at baseline.  Psychiatric:     Comments: Intermittently aggressive    Labs on Admission: I have personally reviewed following labs and imaging studies  CBC: Recent Labs  Lab 03/23/22 0000 03/28/22 1647 03/28/22 2127  WBC 11.3* 11.2*  --   NEUTROABS 9,831* 8.1*  --   HGB 8.2* 7.8* 9.5*  HCT 25.9* 26.4* 28.0*  MCV 96.6 105.2*  --   PLT 482* 480*  --     Basic Metabolic Panel: Recent Labs  Lab 03/23/22 0000 03/28/22 1647 03/28/22 2127  NA 129* 131* 133*  K 6.0* 6.1* 5.5*  CL 88* 91* 92*  CO2 32 33*  --   GLUCOSE 97 106* 101*  BUN 28* 29* 34*  CREATININE 1.22* 1.08* 1.10*  CALCIUM 9.3 9.8  --     GFR: Estimated Creatinine Clearance: 28.9 mL/min (A) (by C-G formula based on SCr of 1.1 mg/dL (H)).  Liver Function Tests: Recent Labs  Lab 03/28/22 1647  AST 17  ALT 12  ALKPHOS 89  BILITOT 0.4  PROT 9.4*  ALBUMIN 2.6*    Urine analysis:    Component Value Date/Time   COLORURINE YELLOW 03/28/2022 1906   APPEARANCEUR  CLEAR 03/28/2022 1906   LABSPEC 1.014 03/28/2022 1906   PHURINE 5.0 03/28/2022 1906   GLUCOSEU NEGATIVE 03/28/2022 1906   HGBUR  NEGATIVE 03/28/2022 1906   BILIRUBINUR NEGATIVE 03/28/2022 1906   BILIRUBINUR negative 01/26/2022 1447   BILIRUBINUR Neg 02/06/2019 1423   KETONESUR NEGATIVE 03/28/2022 1906   PROTEINUR NEGATIVE 03/28/2022 1906   UROBILINOGEN 0.2 01/26/2022 1447   NITRITE NEGATIVE 03/28/2022 1906   LEUKOCYTESUR NEGATIVE 03/28/2022 1906    Radiological Exams on Admission: DG Chest 2 View  Result Date: 03/28/2022 CLINICAL DATA:  Confusion.  Dizziness and possible UTI. EXAM: CHEST - 2 VIEW COMPARISON:  Chest radiograph dated March 14, 2022. FINDINGS: The heart size and mediastinal contours are within normal limits. Advanced emphysematous changes. Multifocal opacities in the left upper and lower lobe concerning for atelectasis or pneumonia or combination of both, not significantly changed from prior examination. IMPRESSION: Multifocal opacities involving the left upper and lower lobes concerning for multifocal pneumonia and/or atelectasis, unchanged. Electronically Signed   By: Larose Hires D.O.   On: 03/28/2022 20:37    EKG: I had not yet performed or not yet released to be viewed.  Assessment/Plan Principal Problem:   Acute encephalopathy Active Problems:   PTSD (post-traumatic stress disorder)   Neurosis, anxiety, panic type   AKI (acute kidney injury) (HCC)   COPD (chronic obstructive pulmonary disease) (HCC)   Anxiety and depression/PTSD/recent SI attempt   Mycobacterium avium infection with cavitory lesion   Anemia of chronic disease   Acquired hypothyroidism   Stage 3a chronic kidney disease (HCC)   Personality disorder (HCC)   Essential hypertension   Acute encephalopathy > Patient presenting with acute confusion and weakness similar to previous admission. > Likely as before this is multifactorial in the setting of apparent infection with leukocytosis as below and  exacerbation of her chronic psychiatric conditions. > Will work-up for infection as below and continue to treat her psychiatric conditions with home medications and possible as needed's. - Monitor on telemetry - Further work-up as below  PTSD Anxiety Depression Personality disorder > Her altered mentation is presenting itself with aggression.  Will likely need one-to-one sitter at some point (RN placed order but was told staffing not available).  Did receive dose of Ativan and Haldol. - Continue home Zyprexa (Daughter says recently increased to 5mg ) - Continue as needed Klonopin - Responded well to PRN Haldol 5mg  IV in the ED, if becomes agitated again this may be a good option.  Leukocytosis > Noted to have leukocytosis in the ED of 11.2.  Unclear etiology at this time.  Does have recent UTI that grew out E faecalis outpatient with concern for not following to p.o. therapy however urinalysis here is clear. > Chest x-ray showing multifocal pneumonia of the left upper and lower lobes however this is similar to previous as she was recently admitted for pneumonia. > Urine cultures and blood cultures obtained in the ED as well. > Patient initiated on broad-spectrum antibiotics at this time while work-up continues - Monitor on telemetry - Continue with broad-spectrum antibiotics for now - Follow-up urine cultures and blood cultures - Procalcitonin consider but patient meets exclusion criteria of MAC infection - Trend fever curve and white count  Hyponatremia Hyperkalemia CKD 3 > Patient with known CKD 3 with creatinine stable at 1.08.  Also found to have sodium 131 which is mild and chronic for her.  Potassium elevated at 6.1. > Has received a liter of IV fluids and a dose of Lokelma and bicarb. - Monitor sodium response to supportive care with IV fluids - Monitor potassium response to [facility] - Trend renal function electrolytes -  Avoid nephrotoxic agents  Hypertension - On atenolol  outpatient, holding this given blood pressure was low normal in ED.  COPD Chronic respiratory failure - Continue home Dulera, Spiriva, as needed albuterol - Continue home supplemental oxygen (keep saturation 88-92%) - BiPAP prescribed at night but patient refuses  MAC infection with cavitary lesion - This has been followed by infectious disease and pulmonology outpatient.  Anemia > Hemoglobin stable at 7.8 - We will continue to trend CBC  DVT prophylaxis: Lovenox Code Status:   DNR Family Communication:  Updated at bedside Disposition Plan:   Patient is from:  Home  Anticipated DC to:  Home  Anticipated DC date:  Pending clinical course  Anticipated DC barriers: None  Consults called:  None Admission status:  Observation, telemetry   Severity of Illness: The appropriate patient status for this patient is OBSERVATION. Observation status is judged to be reasonable and necessary in order to provide the required intensity of service to ensure the patient's safety. The patient's presenting symptoms, physical exam findings, and initial radiographic and laboratory data in the context of their medical condition is felt to place them at decreased risk for further clinical deterioration. Furthermore, it is anticipated that the patient will be medically stable for discharge from the hospital within 2 midnights of admission.    Synetta Fail MD [facility]  How to contact the [facility] Attending or Consulting provider 7A - 7P or covering provider during after hours 7P -7A, for this patient?   Check the care team in [facility] and look for a) attending/consulting [facility] provider listed and b) the [facility] team listed Log into [email or web address] and use [facility]'s universal password to access. If you do not have the password, please contact the hospital operator. Locate the [facility] provider you are looking for under [facility] and page to a number that you can be directly reached. If you still have  difficulty reaching the provider, please page the [facility] (Director on Call) for the Hospitalists listed on amion for assistance.  03/28/2022, 9:55 PM

## 2022-03-28 NOTE — ED Notes (Signed)
This RN put an order in for a Recruitment consultant. Staffing was called, per staffing there are no sitters at this time available. ?

## 2022-03-28 NOTE — Telephone Encounter (Signed)
Claire [name] states the medication doesn't seem to be working. Claire [name] is more confused now. She doesn't want to take her to the ED.  ?

## 2022-03-28 NOTE — ED Notes (Addendum)
Patient's daughter, [name] requests call from admitting MD for information correlation - [phone]. ?

## 2022-03-28 NOTE — Progress Notes (Signed)
Stop augmentin. Pt is going to need hospital to Enterococcus and only needed to be treat vancomycin IV. Pt's daughter was called and going to take to ED.

## 2022-03-28 NOTE — ED Notes (Signed)
PT refusing EKG. MD made aware ?

## 2022-03-28 NOTE — Telephone Encounter (Signed)
Received call from [employer] that hospital bed needs office visit note that discusses why patient needs hospital bed with gel overlay. Can you addend last office note and put something in that explains that?  ?

## 2022-03-29 DIAGNOSIS — E43 Unspecified severe protein-calorie malnutrition: Secondary | ICD-10-CM | POA: Diagnosis not present

## 2022-03-29 DIAGNOSIS — K582 Mixed irritable bowel syndrome: Secondary | ICD-10-CM | POA: Diagnosis present

## 2022-03-29 DIAGNOSIS — K219 Gastro-esophageal reflux disease without esophagitis: Secondary | ICD-10-CM | POA: Diagnosis not present

## 2022-03-29 DIAGNOSIS — A310 Pulmonary mycobacterial infection: Secondary | ICD-10-CM | POA: Diagnosis not present

## 2022-03-29 DIAGNOSIS — N390 Urinary tract infection, site not specified: Secondary | ICD-10-CM | POA: Diagnosis present

## 2022-03-29 DIAGNOSIS — E875 Hyperkalemia: Secondary | ICD-10-CM | POA: Diagnosis present

## 2022-03-29 DIAGNOSIS — R627 Adult failure to thrive: Secondary | ICD-10-CM | POA: Diagnosis present

## 2022-03-29 DIAGNOSIS — G9341 Metabolic encephalopathy: Secondary | ICD-10-CM | POA: Diagnosis present

## 2022-03-29 DIAGNOSIS — F32A Depression, unspecified: Secondary | ICD-10-CM | POA: Diagnosis not present

## 2022-03-29 DIAGNOSIS — E871 Hypo-osmolality and hyponatremia: Secondary | ICD-10-CM | POA: Diagnosis present

## 2022-03-29 DIAGNOSIS — R630 Anorexia: Secondary | ICD-10-CM | POA: Diagnosis present

## 2022-03-29 DIAGNOSIS — D638 Anemia in other chronic diseases classified elsewhere: Secondary | ICD-10-CM | POA: Diagnosis not present

## 2022-03-29 DIAGNOSIS — Z66 Do not resuscitate: Secondary | ICD-10-CM | POA: Diagnosis present

## 2022-03-29 DIAGNOSIS — F4310 Post-traumatic stress disorder, unspecified: Secondary | ICD-10-CM | POA: Diagnosis present

## 2022-03-29 DIAGNOSIS — I129 Hypertensive chronic kidney disease with stage 1 through stage 4 chronic kidney disease, or unspecified chronic kidney disease: Secondary | ICD-10-CM | POA: Diagnosis present

## 2022-03-29 DIAGNOSIS — J9612 Chronic respiratory failure with hypercapnia: Secondary | ICD-10-CM | POA: Diagnosis present

## 2022-03-29 DIAGNOSIS — R1011 Right upper quadrant pain: Secondary | ICD-10-CM | POA: Diagnosis not present

## 2022-03-29 DIAGNOSIS — F609 Personality disorder, unspecified: Secondary | ICD-10-CM | POA: Diagnosis present

## 2022-03-29 DIAGNOSIS — N1831 Chronic kidney disease, stage 3a: Secondary | ICD-10-CM | POA: Diagnosis not present

## 2022-03-29 DIAGNOSIS — I10 Essential (primary) hypertension: Secondary | ICD-10-CM | POA: Diagnosis not present

## 2022-03-29 DIAGNOSIS — Z9981 Dependence on supplemental oxygen: Secondary | ICD-10-CM | POA: Diagnosis not present

## 2022-03-29 DIAGNOSIS — Z720 Tobacco use: Secondary | ICD-10-CM | POA: Diagnosis not present

## 2022-03-29 DIAGNOSIS — G9340 Encephalopathy, unspecified: Secondary | ICD-10-CM

## 2022-03-29 DIAGNOSIS — J9611 Chronic respiratory failure with hypoxia: Secondary | ICD-10-CM | POA: Diagnosis present

## 2022-03-29 DIAGNOSIS — K589 Irritable bowel syndrome without diarrhea: Secondary | ICD-10-CM | POA: Diagnosis not present

## 2022-03-29 DIAGNOSIS — Z818 Family history of other mental and behavioral disorders: Secondary | ICD-10-CM | POA: Diagnosis not present

## 2022-03-29 DIAGNOSIS — F419 Anxiety disorder, unspecified: Secondary | ICD-10-CM | POA: Diagnosis not present

## 2022-03-29 DIAGNOSIS — E039 Hypothyroidism, unspecified: Secondary | ICD-10-CM | POA: Diagnosis not present

## 2022-03-29 DIAGNOSIS — N179 Acute kidney failure, unspecified: Secondary | ICD-10-CM | POA: Diagnosis not present

## 2022-03-29 DIAGNOSIS — Z681 Body mass index (BMI) 19 or less, adult: Secondary | ICD-10-CM | POA: Diagnosis not present

## 2022-03-29 DIAGNOSIS — J449 Chronic obstructive pulmonary disease, unspecified: Secondary | ICD-10-CM | POA: Diagnosis not present

## 2022-03-29 DIAGNOSIS — B952 Enterococcus as the cause of diseases classified elsewhere: Secondary | ICD-10-CM | POA: Diagnosis present

## 2022-03-29 LAB — CBC
HCT: 24.8 % — ABNORMAL LOW (ref 36.0–46.0)
Hemoglobin: 7.4 g/dL — ABNORMAL LOW (ref 12.0–15.0)
MCH: 31.4 pg (ref 26.0–34.0)
MCHC: 29.8 g/dL — ABNORMAL LOW (ref 30.0–36.0)
MCV: 105.1 fL — ABNORMAL HIGH (ref 80.0–100.0)
Platelets: 434 10*3/uL — ABNORMAL HIGH (ref 150–400)
RBC: 2.36 MIL/uL — ABNORMAL LOW (ref 3.87–5.11)
RDW: 15.6 % — ABNORMAL HIGH (ref 11.5–15.5)
WBC: 12.1 10*3/uL — ABNORMAL HIGH (ref 4.0–10.5)
nRBC: 0 % (ref 0.0–0.2)

## 2022-03-29 LAB — COMPREHENSIVE METABOLIC PANEL
ALT: 10 U/L (ref 0–44)
AST: 13 U/L — ABNORMAL LOW (ref 15–41)
Albumin: 2.4 g/dL — ABNORMAL LOW (ref 3.5–5.0)
Alkaline Phosphatase: 84 U/L (ref 38–126)
Anion gap: 4 — ABNORMAL LOW (ref 5–15)
BUN: 24 mg/dL — ABNORMAL HIGH (ref 8–23)
CO2: 33 mmol/L — ABNORMAL HIGH (ref 22–32)
Calcium: 9 mg/dL (ref 8.9–10.3)
Chloride: 97 mmol/L — ABNORMAL LOW (ref 98–111)
Creatinine, Ser: 1.06 mg/dL — ABNORMAL HIGH (ref 0.44–1.00)
GFR, Estimated: 56 mL/min — ABNORMAL LOW (ref >60)
Glucose, Bld: 113 mg/dL — ABNORMAL HIGH (ref 70–99)
Potassium: 5.1 mmol/L (ref 3.5–5.1)
Sodium: 134 mmol/L — ABNORMAL LOW (ref 135–145)
Total Bilirubin: 0.4 mg/dL (ref 0.3–1.2)
Total Protein: 8.5 g/dL — ABNORMAL HIGH (ref 6.5–8.1)

## 2022-03-29 MED ORDER — ATENOLOL 25 MG PO TABS
25.0000 mg | ORAL_TABLET | Freq: Two times a day (BID) | ORAL | Status: DC
  Administered 2022-03-29 – 2022-04-01 (×6): 25 mg via ORAL
  Filled 2022-03-29 (×7): qty 1

## 2022-03-29 MED ORDER — HALOPERIDOL LACTATE 5 MG/ML IJ SOLN
5.0000 mg | Freq: Four times a day (QID) | INTRAMUSCULAR | Status: DC | PRN
  Administered 2022-03-29 – 2022-04-01 (×3): 5 mg via INTRAVENOUS
  Filled 2022-03-29 (×3): qty 1

## 2022-03-29 NOTE — Progress Notes (Signed)
[facility] night cross cover note: ? ?I was notified by RN that this patient, who is admitted on 03/28/2022 for acute encephalopathy, is demonstrating evidence of progressive agitation,  screaming at and attempting to strike hospital staff, pulling at peripheral IV, with these behaviors refractory to attempts at verbal redirection.  In the setting of associated interference with ongoing medical treatment posing potential harm to themself, I have placed order for prn IV Haldol. ? ? ?Newton Pigg, DO ?Hospitalist ? ?

## 2022-03-29 NOTE — ED Notes (Signed)
Breakfast order placed ?

## 2022-03-29 NOTE — Progress Notes (Signed)
?PROGRESS NOTE ? ?Claire [name]  ?DOB: 1951-05-08  ?PCP: Donella Stade, PA-C ?IFO:[id number]  ?DOA: 03/28/2022 ? LOS: 0 days  ?Hospital Day: 2 ? ?Brief narrative: ?Claire [name] is a 71 y.o. female with PMH significant for HTN, CKD 3, chronic anemia, COPD on 2 to 3 L oxygen, MAC infection with cavitary lesion, hypothyroidism, hyperparathyroidism, PTSD, anxiety disorder, depression, GERD. ?Patient presented to the ED on 4/24 with altered mentation. ?She was recently seen by PCP for weakness and was noted to have UTI with E faecalis.  She was started on Augmentin with some initial improvement but started to worsen again with significant confusion, weakness and falls. ? ?She had recent hospitalizations ?-2/3-2/6 for COPD exacerbation and newly diagnosed cavitary lung lesions secondary to [place],  ?-2/14 for anemia,  ?-3/21 for SI with clonazepam overdose,  ?-3/23-4/1 for hyponatremia at [facility].  ?-6/7-6/72 for metabolic encephalopathy secondary to pneumonia and exacerbation of psych issues.  She was seen by pulmonary, psychiatric and palliative care. ? ?In the ED, patient was afebrile, hemodynamically stable, on 2 L oxygen which is her baseline. ?Labs showed sodium 131, potassium 6.1, stable BUN/creatinine, hemoglobin 7.8 stable, platelet elevated 480, WC count slightly elevated to 11.2.  Lactate is normal. ?Urine culture and blood culture were sent. ?Chest x-ray showed multifocal opacities of the left upper and left lower lobe consistent with pneumonia or atelectasis.  Findings unchanged in 2 weeks ?Patient was started on IV vancomycin and IV cefepime.  She was also given a dose of IV Ativan and Haldol because of agitation.   ?For hyperkalemia, she was given Lokelma and bicarbonate and started on IV fluid.   ?Kept in observation to hospitalist service.   ? ?Subjective: ?Patient was seen and examined, this morning. ?Present, elderly Caucasian female.  Alert, awake, oriented to place and person.  Slow to respond.   Inconsistent.  Unable to remember the details of why she was brought to the hospital. ?Family not at bedside. ?Chart reviewed ?Remains hemodynamically stable ?Labs showed gradual improvement in potassium level to 5.1, creatinine stable at 1.06, hemoglobin 7.4 ? ? ?Principal Problem: ?  Acute encephalopathy ?Active Problems: ?  PTSD (post-traumatic stress disorder) ?  Neurosis, anxiety, panic type ?  AKI (acute kidney injury) ([place]) ?  COPD (chronic obstructive pulmonary disease) ([place]) ?  Anxiety and depression/PTSD/recent SI attempt ?  Mycobacterium avium infection with cavitory lesion ?  Anemia of chronic disease ?  Acquired hypothyroidism ?  Stage 3a chronic kidney disease ([place]) ?  Personality disorder ([place]) ?  Essential hypertension ?  ? ?Assessment and Plan: ?Acute metabolic encephalopathy ?-Patient presented with acute confusion and weakness.  She was admitted with similar symptoms in the past secondary to different causes including hyponatremia, Klonopin overdose, infection.   ?-Denies any suicidal ideation or drug overdose this time.  No fever but has mild leukocytosis.   ?-Continue to monitor mental status change.  ?-If no improvement in next 24 hours with antibiotics, please reconsult psychiatry. ? ?Recurrent UTI ?-Recent urine culture as an outpatient grew E faecalis.  Incompletely improved with Augmentin.  Currently on broad-spectrum antibiotics.  Continue same.  Urine culture sent. ? ?PTSD ?Anxiety ?Depression ?Personality disorder ?-PTA psych meds include Zyprexa, Klonopin as needed. ?-In the ED, she was given as needed IV Ativan, Haldol.  ?-Per family, Zyprexa dose was recently increased to 5 mg ? ?Persistent multifocal infiltrates in left lung ?History of cavitary MAC lesion ?-Chest x-ray obtained on 4/10 and 4/24 both show multifocal infiltrates  in left upper and lower lobes. ?-Findings do not seem to be consistent with pneumonia. ?-Patient has history of cavitary MAC lesion.  ?-Last seen by pulmonary  Dr. Carlis Abbott on 4/5 during previous hospitalization.  Recommendation at the time was to complete 7 to 10 days of antibiotics.  Per pulmonology, patient was noted to have lack of willingness to participate on a prolonged treatment course of MAC with potential side effects. ?Recent Labs  ?Lab 03/23/22 ?0000 03/28/22 ?7106 03/29/22 ?0407  ?WBC 11.3* 11.2* 12.1*  ?LATICACIDVEN  --  0.8  --   ? ?Hyperkalemia ?-Potassium level was elevated to 6.1 on admission.  With Lokelma, bicarb and IV fluid, it is trending down, five-point 1 repeat today. ?Recent Labs  ?Lab 03/23/22 ?0000 03/28/22 ?2694 03/28/22 ?2127 03/29/22 ?0407  ?K 6.0* 6.1* 5.5* 5.1  ? ?CKD 3a ?-Creatinine stable at baseline.  ?-Continue monitor renal function.  ? ?Mild chronic hyponatremia ?-Remains between 130 and 134 ?Recent Labs  ?Lab 03/23/22 ?0000 03/28/22 ?8546 03/28/22 ?2127 03/29/22 ?0407  ?NA 129* 131* 133* 134*  ? ?Hypertension ?-Resume atenolol.  Continue to monitor blood pressure. ? ?COPD ?Chronic respiratory failure ?-Continue home Dulera, Spiriva, as needed albuterol ?-Continue home supplemental oxygen (keep saturation 88-92%) ?-BiPAP prescribed at night but patient refuses ?  ?Chronic macrocytic anemia ?-Hemoglobin hemoglobin stable between 7 and 8.  No active bleeding.  Recent vitamin B12 level appropriate.  Obtain folate level. ?-Continue to monitor. ?Recent Labs  ?  04/27/21 ?1500 05/27/21 ?0000 01/07/22 ?1401 01/08/22 ?1003 01/26/22 ?0000 02/14/22 ?1156 03/06/22 ?1721 03/06/22 ?1920 03/16/22 ?0000 03/23/22 ?0000 03/28/22 ?1647 03/28/22 ?2127 03/29/22 ?0407  ?HGB 9.2*   < >  --    < > 8.3*   < >  --    < > 8.2* 8.2* 7.8* 9.5* 7.4*  ?MCV 97   < >  --    < > 92.8   < >  --    < > 95.1 96.6 105.2*  --  105.1*  ?[id number]  --   --   --   --   --   --  490  --   --   --   --   --   --   ?FERRITIN 243*  --  573*  --  409*  --   --   --   --   --   --   --   --   ?TIBC 237*  --  126*  --  133*  --   --   --   --   --   --   --   --   ?IRON 34  --  48   --  12*  --   --   --   --   --   --   --   --   ? < > = values in this interval not displayed.  ? ?Goals of care ?  Code Status: DNR  ? ? ?Mobility: Encourage ambulation. ? ?Skin assessment:  ?  ? ?Nutritional status:  ?There is no height or weight on file to calculate BMI.  ?  ?  ? ? ? ? ?Diet:  ?Diet Order   ? ?       ?  Diet regular Room service appropriate? Yes; Fluid consistency: Thin  Diet effective now       ?  ? ?  ?  ? ?  ? ? ?DVT prophylaxis:  ?enoxaparin (LOVENOX) injection 30 mg  Start: 03/29/22 1000 ?  ?Antimicrobials: None ?Fluid: Not on IV fluid ?Consultants: None ?Family Communication: None at bedside ? ?Status is: Observation ? ?Continue in-hospital care because: Needs IV antibiotics ?Level of care: Telemetry Medical  ? ?Dispo: The patient is from: Home ?             Anticipated d/c is to: Hopefully home in next 2 to 3 days ?             Patient currently is not medically stable to d/c. ?  Difficult to place patient No ? ? ? ? ?Infusions:  ? ceFEPime (MAXIPIME) IV 2 g (03/29/22 1320)  ? [START ON 03/30/2022] vancomycin    ? ? ?Scheduled Meds: ? atenolol  25 mg Oral BID  ? clonazePAM  0.5 mg Oral TID  ? enoxaparin (LOVENOX) injection  30 mg Subcutaneous Daily  ? famotidine  20 mg Oral BID  ? feeding supplement  237 mL Oral TID BM  ? ferrous sulfate  325 mg Oral Q breakfast  ? levothyroxine  75 mcg Oral Daily  ? mometasone-formoterol  2 puff Inhalation BID  ? OLANZapine  5 mg Oral QHS  ? saccharomyces boulardii  250 mg Oral Daily  ? sodium chloride flush  3 mL Intravenous Q12H  ? thiamine  100 mg Oral Daily  ? umeclidinium bromide  1 puff Inhalation Daily  ? ? ?PRN meds: ?acetaminophen **OR** acetaminophen, albuterol, diphenoxylate-atropine, polyethylene glycol  ? ?Antimicrobials: ?Anti-infectives (From admission, onward)  ? ? Start     Dose/Rate Route Frequency Ordered Stop  ? 03/30/22 2000  vancomycin (VANCOREADY) IVPB 750 mg/150 mL       ? 750 mg ?150 mL/hr over 60 Minutes Intravenous Every 48 hours  03/28/22 2247    ? 03/28/22 2200  ceFEPIme (MAXIPIME) 2 g in sodium chloride 0.9 % 100 mL IVPB       ? 2 g ?200 mL/hr over 30 Minutes Intravenous Every 12 hours 03/28/22 2130    ? 03/28/22 2030  vancomycin

## 2022-03-29 NOTE — ED Notes (Signed)
Attempted new IV x3. Will put in order for IV team.  ?

## 2022-03-29 NOTE — ED Notes (Signed)
This RN asked MD Silverio Lay if he wanted 2nd Blood Culture collected as well as lactic. Per Silverio Lay MD, he wanted to prioritize getting blood for a chem 8 panel, since pt was showing aggressive behaviors.  ?

## 2022-03-30 DIAGNOSIS — N179 Acute kidney failure, unspecified: Secondary | ICD-10-CM

## 2022-03-30 DIAGNOSIS — F609 Personality disorder, unspecified: Secondary | ICD-10-CM

## 2022-03-30 DIAGNOSIS — F419 Anxiety disorder, unspecified: Secondary | ICD-10-CM

## 2022-03-30 DIAGNOSIS — E039 Hypothyroidism, unspecified: Secondary | ICD-10-CM

## 2022-03-30 DIAGNOSIS — N1831 Chronic kidney disease, stage 3a: Secondary | ICD-10-CM

## 2022-03-30 DIAGNOSIS — I10 Essential (primary) hypertension: Secondary | ICD-10-CM

## 2022-03-30 DIAGNOSIS — F4310 Post-traumatic stress disorder, unspecified: Secondary | ICD-10-CM

## 2022-03-30 DIAGNOSIS — F32A Depression, unspecified: Secondary | ICD-10-CM

## 2022-03-30 DIAGNOSIS — A310 Pulmonary mycobacterial infection: Secondary | ICD-10-CM

## 2022-03-30 DIAGNOSIS — G9340 Encephalopathy, unspecified: Secondary | ICD-10-CM | POA: Diagnosis not present

## 2022-03-30 LAB — CBC WITH DIFFERENTIAL/PLATELET
Abs Immature Granulocytes: 0.03 10*3/uL (ref 0.00–0.07)
Basophils Absolute: 0.1 10*3/uL (ref 0.0–0.1)
Basophils Relative: 1 %
Eosinophils Absolute: 0.5 10*3/uL (ref 0.0–0.5)
Eosinophils Relative: 6 %
HCT: 23.9 % — ABNORMAL LOW (ref 36.0–46.0)
Hemoglobin: 7.3 g/dL — ABNORMAL LOW (ref 12.0–15.0)
Immature Granulocytes: 0 %
Lymphocytes Relative: 20 %
Lymphs Abs: 1.9 10*3/uL (ref 0.7–4.0)
MCH: 31.5 pg (ref 26.0–34.0)
MCHC: 30.5 g/dL (ref 30.0–36.0)
MCV: 103 fL — ABNORMAL HIGH (ref 80.0–100.0)
Monocytes Absolute: 0.9 10*3/uL (ref 0.1–1.0)
Monocytes Relative: 9 %
Neutro Abs: 6 10*3/uL (ref 1.7–7.7)
Neutrophils Relative %: 64 %
Platelets: 421 10*3/uL — ABNORMAL HIGH (ref 150–400)
RBC: 2.32 MIL/uL — ABNORMAL LOW (ref 3.87–5.11)
RDW: 15.4 % (ref 11.5–15.5)
WBC: 9.3 10*3/uL (ref 4.0–10.5)
nRBC: 0 % (ref 0.0–0.2)

## 2022-03-30 LAB — BASIC METABOLIC PANEL
Anion gap: 7 (ref 5–15)
BUN: 17 mg/dL (ref 8–23)
CO2: 33 mmol/L — ABNORMAL HIGH (ref 22–32)
Calcium: 9.2 mg/dL (ref 8.9–10.3)
Chloride: 93 mmol/L — ABNORMAL LOW (ref 98–111)
Creatinine, Ser: 0.97 mg/dL (ref 0.44–1.00)
GFR, Estimated: >60 mL/min (ref >60)
Glucose, Bld: 100 mg/dL — ABNORMAL HIGH (ref 70–99)
Potassium: 4.4 mmol/L (ref 3.5–5.1)
Sodium: 133 mmol/L — ABNORMAL LOW (ref 135–145)

## 2022-03-30 LAB — URINE CULTURE: Culture: <10000 — AB

## 2022-03-30 LAB — FOLATE: Folate: 12.6 ng/mL (ref >5.9)

## 2022-03-30 NOTE — Evaluation (Addendum)
Physical Therapy Evaluation ?Patient Details ?Name: Claire [name] ?MRN: [id number] ?DOB: September 18, 1951 ?Today's Date: 03/30/2022 ? ?History of Present Illness ? Claire [name] is a 71 y.o. female presenting with altered mentation.PMH: PTSD, anxiety, depression, personality d/o, chronic respiratory failure, COPD, anemia, hypothyroidism, MAC infection with cavitary lesion, CKD 3, HTN  ? ?Clinical Impression ?  ?Pt admitted with above. Pt poor historian, PLOF and home set up taken from chart during recent admission on 03/07/2022. Pt anxious/nervous with noted SOB, SPO2 at 83% on RA, 90% on 2lO2 via [place] during mobility. Pt requiring minA for mobility and max directional verbal cues due to impaired cognition. Pt to required 24/7 assist/supervision due to impaired cognition and increased falls risk. Pt oriented to self only, has decreased insight to deficits and safety, is confused, and lacks problem solving, and ability to care for self. Recommend OT eval to address cognition and ADLs. Acute PT to cont to follow. ?   ? ?Recommendations for follow up therapy are one component of a multi-disciplinary discharge planning process, led by the attending physician.  Recommendations may be updated based on patient status, additional functional criteria and insurance authorization. ? ?Follow Up Recommendations Home health PT ? ?  ?Assistance Recommended at Discharge Frequent or constant Supervision/Assistance  ?Patient can return home with the following ? A little help with walking and/or transfers;A little help with bathing/dressing/bathroom;Direct supervision/assist for medications management;Direct supervision/assist for financial management;Assist for transportation;Help with stairs or ramp for entrance ? ?  ?Equipment Recommendations  (per chart pt with RW)  ?Recommendations for Other Services ?    ?  ?Functional Status Assessment Patient has had a recent decline in their functional status and demonstrates the ability to make  significant improvements in function in a reasonable and predictable amount of time.  ? ?  ?Precautions / Restrictions Precautions ?Precautions: Fall ?Precaution Comments: monitor O2 ?Restrictions ?Weight Bearing Restrictions: No  ? ?  ? ?Mobility ? Bed Mobility ?  ?  ?  ?  ?  ?  ?  ?General bed mobility comments: pt sitting up at EOB ?  ? ?Transfers ?Overall transfer level: Needs assistance ?Equipment used: 1 person hand held assist ?Transfers: Sit to/from Stand ?Sit to Stand: Min guard, Min assist ?  ?  ?  ?  ?  ?General transfer comment: pt used rail when standing up from commode, pt guarded, shaky, slow, pt reached for PTs hand upon standing ?  ? ?Ambulation/Gait ?Ambulation/Gait assistance: Min guard ?Gait Distance (Feet): 50 Feet ?Assistive device: Rolling walker (2 wheels) ?Gait Pattern/deviations: Step-through pattern, Decreased stride length, Trunk flexed, Narrow base of support ?Gait velocity: slow ?Gait velocity interpretation: <1.31 ft/sec, indicative of household ambulator ?  ?General Gait Details: pt with noted SOB, SpO2 at 82% on RA, 85% on 2Lo2 via [place], pt SpO2 dropped to 79%, verbal cues for purse lipped breathing, pt initially trying to ambulate without RW however was usnteady and reaching for counters and PTs hands, pt given RW, pt much more stable, required minA for walker management to not push to far out in front ? ?Stairs ?  ?  ?  ?  ?  ? ?Wheelchair Mobility ?  ? ?Modified Rankin (Stroke Patients Only) ?  ? ?  ? ?Balance Overall balance assessment: Needs assistance ?Sitting-balance support: Feet supported, No upper extremity supported ?Sitting balance-Leahy Scale: Fair ?  ?  ?Standing balance support: Single extremity supported, During functional activity ?Standing balance-Leahy Scale: Fair ?Standing balance comment: more steady with RW ?  ?  ?  ?  ?  ?  ?  ?  ?  ?  ?  ?   ? ? ? ?  Pertinent Vitals/Pain Pain Assessment ?Pain Assessment: No/denies pain  ? ? ?Home Living Family/patient expects to  be discharged to:: Private residence ?Living Arrangements: Children ?Available Help at Discharge: Family (unsure, pt poor historian, per chart pt lives with dtr) ?Type of Home: House ?Home Access: Stairs to enter ?  ?Entrance Stairs-Number of Steps: 3 ?  ?Home Layout: One level ?Home Equipment: Conservation officer, nature (2 wheels) ?Additional Comments: pt poor historian, information taken from previous admission in the beginning of the month  ?  ?Prior Function Prior Level of Function : Needs assist ?  ?  ?  ?  ?  ?  ?Mobility Comments: unsure as pt poor historian, pt states she doesn't use a RW ?ADLs Comments: suspect pt needed assist due to cognitive impairments however no family present to confirm ?  ? ? ?Hand Dominance  ?   ? ?  ?Extremity/Trunk Assessment  ? Upper Extremity Assessment ?Upper Extremity Assessment: Generalized weakness ?  ? ?Lower Extremity Assessment ?Lower Extremity Assessment: Generalized weakness ?  ? ?Cervical / Trunk Assessment ?Cervical / Trunk Assessment: Kyphotic  ?Communication  ?    ?Cognition Arousal/Alertness: Awake/alert ?Behavior During Therapy: Anxious ?Overall Cognitive Status: History of cognitive impairments - at baseline ?  ?  ?  ?  ?  ?  ?  ?  ?  ?Orientation Level: Disoriented to, Place, Time, Situation (pt requiring max verbal cues/hints for hospital) ?Current Attention Level: Focused ?Memory: Decreased short-term memory ?Following Commands: Follows one step commands with increased time ?Safety/Judgement: Decreased awareness of safety, Decreased awareness of deficits ?Awareness: Intellectual ?Problem Solving: Slow processing, Decreased initiation, Difficulty sequencing, Requires verbal cues, Requires tactile cues ?General Comments: pt very anxious stating "i'm always so nervous." mild tremors/shakiness in hands, pt poor historian, oriented to self, pt requiring max encouragement to participate in amb due to fear of falling ?  ?  ? ?  ?General Comments General comments (skin integrity,  edema, etc.): pt very frail, sitter had pt in bathroom upon PT arrival, pt was able to perform pericare with set up and verbal cues but declined washing hands, pt with noted SOB, SpO2 AT 83%, hr 121-133BPM with mobility ? ?  ?Exercises    ? ?Assessment/Plan  ?  ?PT Assessment Patient needs continued PT services  ?PT Problem List Decreased strength;Decreased activity tolerance;Decreased mobility;Decreased cognition;Decreased knowledge of use of DME;Decreased safety awareness ? ?   ?  ?PT Treatment Interventions DME instruction;Gait training;Stair training;Therapeutic activities;Functional mobility training;Therapeutic exercise;Balance training;Neuromuscular re-education;Cognitive remediation;Patient/family education   ? ?PT Goals (Current goals can be found in the Care Plan section)  ?Acute Rehab PT Goals ?Patient Stated Goal: unable to report ?PT Goal Formulation: Patient unable to participate in goal setting ?Time For Goal Achievement: 04/13/22 ?Potential to Achieve Goals: Good ? ?  ?Frequency Min 3X/week ?  ? ? ?Co-evaluation   ?  ?  ?  ?  ? ? ?  ?AM-PAC PT "6 Clicks" Mobility  ?Outcome Measure Help needed turning from your back to your side while in a flat bed without using bedrails?: A Little ?Help needed moving from lying on your back to sitting on the side of a flat bed without using bedrails?: A Little ?Help needed moving to and from a bed to a chair (including a wheelchair)?: A Little ?Help needed standing up from a chair using your arms (e.g., wheelchair or bedside chair)?: A Little ?Help needed to walk in hospital room?: A Little ?Help needed climbing 3-5 steps with a railing? : A  Lot ?6 Click Score: 17 ? ?  ?End of Session Equipment Utilized During Treatment: Gait belt ?Activity Tolerance: Patient limited by fatigue (limited by anxiety and SOB) ?Patient left: in chair;with call bell/phone within reach;with nursing/sitter in room ?Nurse Communication: Mobility status (suggested incentive spirometer, RN  agreed, IS given to patient, sitter to set up and complete with pt) ?PT Visit Diagnosis: Unsteadiness on feet (R26.81);Difficulty in walking, not elsewhere classified (R26.2) ?  ? ?Time: 5277-8242 ?PT Time [place]

## 2022-03-30 NOTE — Progress Notes (Signed)
OT Cancellation Note ? ?Patient Details ?Name: Claire [name] ?MRN: [id number] ?DOB: [birth year] ? ? ?Cancelled Treatment:    Reason Eval/Treat Not Completed: Patient's level of consciousness.  Patient given medications to decrease agitation, currently sleeping and unable to arouse.  Continue efforts.  ? ?Kaamil Morefield D Catherine Cubero ?03/30/2022, 5:20 PM ?

## 2022-03-30 NOTE — Progress Notes (Signed)
Length of Need 6 Months  °The above medical condition requires: Patient requires the ability to reposition frequently  °Head must be elevated greater than: 30 degrees  °Bed type Semi-electric  °Support Surface: Gel Overlay  ° °

## 2022-03-30 NOTE — Progress Notes (Signed)
? ?PROGRESS NOTE ? ? ? ?Claire [name]  [id number] DOB: May 05, 1951 DOA: 03/28/2022 ?PCP: Donella Stade, PA-C ? ? ?Brief Narrative: ? ?Claire [name] is a 71 y.o. female with a history of hypertension, CKD, anemia, COPD, chronic respiratory failure, MAC, hypothyroidism, hyperparathyroidism, PTSD, anxiety, depression, GERD. Patient presented secondary to altered mentation. Patient with multiple hospitalizations in the last two months. This admission, patient was started on empiric vancomycin and cefepime for possible HCAP and continue treatment of UTI. Mental status improved with antibiotics. Per daughter, patient expressed homicidal ideation toward her. ? ? ?Assessment and Plan: ? ?Acute metabolic encephalopathy ?Possibly related to UTI. Patient with underlying behavioral health diagnoses which may be contributing to confusion/agitation as well. Metabolic encephalopathy appears to be resolved, but agitation issues possibly related to behavioral diagnoses continue. ? ?Enterococcus UTI ?Recurrent UTI ?Patient was partially treated with Augmentin. Per daughter, patient was taken to the hospital because she was told she would need IV antibiotics. Enterococcus faecalis noted on PTA urine culture, sensitive to ampicillin. Patient has been managed on Vancomycin for initial concern for HCAP. ?-Discontinue Vancomycin in AM after completion of 5 days of treatment ? ?Possible homicidal ideation ?Per daughter, patient expressed homicidal ideation toward her on day of admission. Patient with recent behavioral health admission for overdose on clonazepam. Per chart review, patient was threatening suicide at the time in addition to experiencing hallucinations.  ?-Psychiatry consult ?-Continue 1:1 sitter until cleared by psychiatry ? ?PTSD ?Anxiety ?Depression ?Personality disorder ?Zyprexa recently increased to 5 mg qhs. Patient is also on Klonopin 0.5 mg TID ?-Continue Klonopin and Zyprexa ? ?Multifocal infiltrates ?History of  cavitary MAC ?Persistent infiltrates without evidence of acute illness. Patient previously completed antibiotics. Patient will need pulmonology follow-up as an outpatient. ? ?Hyperkalemia ?Present on admission. Peak potassium of 6.1. Treated with Lokelma and now resolved. ? ?CKD stage IIIa ?Stable. ? ?Chronic hyponatremia ?Stable. Likely not contributing to mental status changes. ? ?Primary hypertension ?-Continue atenolol ? ?COPD ?Chronic respiratory failure with hypoxia and hypercapnia ?Patient is on 2 L/min of oxygen as an outpatient. Previously recommended for NIV at night. Patient prescribed BiPAP during admission but is declining use at this time. ?-Continue BiPAP qhs ?-Continue Incruse Ellipta and albuterol ?-Attempt to qualify for NIV as an outpatient ?-Will need pulmonology follow-up as an outpatient ? ?Chronic macrocytic anemia ?Slight downward drift without evidence of acute bleeding. ? ? ?DVT prophylaxis: Lovenox ?Code Status:   Code Status: DNR ?Family Communication: Daughter on telephone ?Disposition Plan: Discharge possibly home pending psychiatry recommendations, in addition to home equipment delivery ? ? ?Consultants:  ?Psychiatry ? ?Procedures:  ?None ? ?Antimicrobials: ?Vancomycin ?Cefepime  ? ? ?Subjective: ?Patient reports no issues this morning. Overnight, patient with significant agitation and aggressive behavior. ? ?Objective: ?BP (!) 115/53 (BP Location: Right Arm)   Pulse 84   Temp 97.8 ?F (36.6 ?C) (Axillary)   Resp 18   Ht _0  (1.6 m)   Wt 37.5 kg   SpO2 99%   BMI 14.64 kg/m?  ? ?Examination: ? ?General exam: Appears calm and comfortable ?Respiratory system: Clear to auscultation. Respiratory effort normal. ?Cardiovascular system: S1 & S2 heard, Normal rate with regular rhythm. ?Gastrointestinal system: Abdomen is nondistended, soft and nontender. No organomegaly or masses felt. Normal bowel sounds heard. ?Central nervous system: Alert and oriented. No focal neurological  deficits. ?Musculoskeletal: No edema. No calf tenderness ?Skin: No cyanosis. No rashes  ? ? ?Data Reviewed: I have personally reviewed following labs and imaging studies ? ?  CBC ?Lab Results  ?Component Value Date  ? WBC 9.3 03/30/2022  ? RBC 2.32 (L) 03/30/2022  ? HGB 7.3 (L) 03/30/2022  ? HCT 23.9 (L) 03/30/2022  ? MCV 103.0 (H) 03/30/2022  ? MCH 31.5 03/30/2022  ? PLT 421 (H) 03/30/2022  ? MCHC 30.5 03/30/2022  ? RDW 15.4 03/30/2022  ? LYMPHSABS 1.9 03/30/2022  ? MONOABS 0.9 03/30/2022  ? EOSABS 0.5 03/30/2022  ? BASOSABS 0.1 03/30/2022  ? ? ? ?Last metabolic panel ?Lab Results  ?Component Value Date  ? NA 133 (L) 03/30/2022  ? K 4.4 03/30/2022  ? CL 93 (L) 03/30/2022  ? CO2 33 (H) 03/30/2022  ? BUN 17 03/30/2022  ? CREATININE 0.97 03/30/2022  ? GLUCOSE 100 (H) 03/30/2022  ? GFRNONAA >60 03/30/2022  ? GFRAA 61 05/27/2021  ? CALCIUM 9.2 03/30/2022  ? PROT 8.5 (H) 03/29/2022  ? ALBUMIN 2.4 (L) 03/29/2022  ? LABGLOB 3.1 04/27/2021  ? AGRATIO 1.4 04/27/2021  ? BILITOT 0.4 03/29/2022  ? ALKPHOS 84 03/29/2022  ? AST 13 (L) 03/29/2022  ? ALT 10 03/29/2022  ? ANIONGAP 7 03/30/2022  ? ? ?GFR: ?Estimated Creatinine Clearance: 31.5 mL/min (by C-G formula based on SCr of 0.97 mg/dL). ? ?Recent Results (from the past 240 hour(s))  ?Urine Culture     Status: Abnormal  ? Collection Time: 03/23/22 12:11 PM  ? Specimen: Urine  ?Result Value Ref Range Status  ? MICRO NUMBER: [id number]  Final  ? SPECIMEN QUALITY: Adequate  Final  ? Sample Source NOT GIVEN  Final  ? STATUS: FINAL  Final  ? ISOLATE 1: Enterococcus faecalis (A)  Final  ?  Comment: 50,000-100,000 CFU/mL of Enterococcus faecalis  ? COMMENT:   Final  ?  Additional non-predominating organism(s) isolated. These organisms, commonly found on external and internal genitalia, are considered colonizers. No further testing performed.  ?    Susceptibility  ? Enterococcus faecalis - URINE CULTURE POSITIVE 1  ?  AMPICILLIN <=2 Sensitive   ?  VANCOMYCIN <=0.5 Sensitive   ?   NITROFURANTOIN* 32 Sensitive   ?   * Legend: ?S = Susceptible  I = Intermediate ?R = Resistant  NS = Not susceptible ?* = Not tested  NR = Not reported ?**NN = See antimicrobic comments ?  ?Blood culture (routine x 2)     Status: None (Preliminary result)  ? Collection Time: 03/28/22  4:47 PM  ? Specimen: BLOOD  ?Result Value Ref Range Status  ? Specimen Description BLOOD RIGHT ARM  Final  ? Special Requests   Final  ?  BOTTLES DRAWN AEROBIC AND ANAEROBIC Blood Culture results may not be optimal due to an inadequate volume of blood received in culture bottles  ? Culture   Final  ?  NO GROWTH 2 DAYS ?Performed at [facility], [place] [address], [place], [place] [address] ?  ? Report Status PENDING  Incomplete  ?Urine Culture     Status: Abnormal  ? Collection Time: 03/29/22  2:54 AM  ? Specimen: Urine, Clean Catch  ?Result Value Ref Range Status  ? Specimen Description URINE, CLEAN CATCH  Final  ? Special Requests NONE  Final  ? Culture (A)  Final  ?  <10,000 COLONIES/mL INSIGNIFICANT GROWTH ?Performed at [facility], [place] [address] [place], [place] [address] ?  ? Report Status 03/30/2022 FINAL  Final  ?  ? ? ?Radiology Studies: ?DG Chest 2 View ? ?Result Date: 03/28/2022 ?CLINICAL DATA:  Confusion.  Dizziness and possible UTI. EXAM: CHEST - 2 VIEW COMPARISON:  Chest radiograph dated March 14, 2022. FINDINGS: The heart size and mediastinal contours are within normal limits. Advanced emphysematous changes. Multifocal opacities in the left upper and lower lobe concerning for atelectasis or pneumonia or combination of both, not significantly changed from prior examination. IMPRESSION: Multifocal opacities involving the left upper and lower lobes concerning for multifocal pneumonia and/or atelectasis, unchanged. Electronically Signed   By: Keane Police D.O.   On: 03/28/2022 20:37   ? ? ? LOS: 1 day  ? ? ?Cordelia Poche, MD ?[facility] ?03/30/2022, 1:45 PM ? ? ?If 7PM-7AM, please contact  night-coverage ?[email or web address] ? ?

## 2022-03-30 NOTE — Progress Notes (Signed)
Initial Nutrition Assessment ? ?DOCUMENTATION CODES:  ?Underweight, Severe malnutrition in context of social or environmental circumstances ? ?INTERVENTION:  ?Continue regular diet, adjust to ordering assistance ?Ensure Enlive po TID, each supplement provides 350 kcal and 20 grams of protein. ?Snacks TID between meals ? ?NUTRITION DIAGNOSIS:  ?Severe Malnutrition related to social / environmental circumstances (mental illness) as evidenced by severe fat depletion, severe muscle depletion, percent weight loss (15% x 3 months). ? ?GOAL:  ?Patient will meet greater than or equal to 90% of their needs ? ?MONITOR:  ?PO intake, Supplement acceptance, Weight trends, Labs ? ?REASON FOR ASSESSMENT:  ?Malnutrition Screening Tool ?  ? ?ASSESSMENT:  ?71 y.o. female with PMH significant for HTN, hypothyroidism, hyperparathyroidism, PTSD, anxiety, depression, GERD, COPD on 2 to 3 L oxygen, cavitary MAC, and chronic anemia presented to ED with AMS. Recently admitted with similar presentation. ? ?Pt familiar to RD from previous admission. Most recent assessment ~2 weeks ago. ? ?Pt resting in bed at the time of visit. Sitter present at bedside. Noted that pt was agitated overnight requiring haldol. Pleasant this AM, asking for son and daughter several times during visit. ? ?Reviewed intake and nutrition plan from last admission with patient. Pt likes ensure and again reiterates that she only likes strawberry. Pt requested an ensure at the end of assessment which was brought to her. Pt agreeable to receiving snacks between meals. Discussed nutrition interventions with RN.  ? ?Poor intake of breakfast noted by sitter. Will ask dining services to assist with ordering meals to optimize intake. ? ?Noted that pt has experienced a 15% weight loss in the last 3 months which is severe ? ?Average Meal Intake: ?4/25: 50% intake x 1 recorded meals ? ?Nutritionally Relevant Medications: ?Scheduled Meds: ? famotidine  20 mg Oral BID  ? Ensure  Enlive  237 mL Oral TID BM  ? ferrous sulfate  325 mg Oral Q breakfast  ? saccharomyces boulardii  250 mg Oral Daily  ? thiamine  100 mg Oral Daily  ? ?Continuous Infusions: ? ceFEPime (MAXIPIME) IV 2 g (03/29/22 2155)  ? vancomycin    ? ?PRN Meds: polyethylene glycol ? ?Labs Reviewed: ?Sodium 134, Chloride 97 ?BUN 24, creatinine 1.06 ?CBG ranges from 101-113 mg/dL over the last 24 hours  ? ?NUTRITION - FOCUSED PHYSICAL EXAM: ?Flowsheet Row Most Recent Value  ?Orbital Region Severe depletion  ?Upper Arm Region Severe depletion  ?Thoracic and Lumbar Region Severe depletion  ?Buccal Region Severe depletion  ?Temple Region Severe depletion  ?Clavicle Bone Region Severe depletion  ?Clavicle and Acromion Bone Region Severe depletion  ?Scapular Bone Region Severe depletion  ?Dorsal Hand Severe depletion  ?Patellar Region Severe depletion  ?Anterior Thigh Region Severe depletion  ?Posterior Calf Region Severe depletion  ?Edema (RD Assessment) None  ?Hair Reviewed  ?Eyes Reviewed  ?Mouth Reviewed  ?Skin Reviewed  ?Nails Reviewed  ? ?Diet Order:   ?Diet Order   ? ?       ?  Diet regular Room service appropriate? Yes; Fluid consistency: Thin  Diet effective now       ?  ? ?  ?  ? ?  ? ? ?EDUCATION NEEDS:  ?Not appropriate for education at this time ? ?Skin:  Skin Assessment: Reviewed RN Assessment ? ?Last BM:  4/24 ? ?Height:  ?Ht Readings from Last 1 Encounters:  ?03/29/22 _0  (1.6 m)  ? ? ?Weight:  ?Wt Readings from Last 1 Encounters:  ?03/30/22 37.5 kg  ? ? ?Ideal  Body Weight:  52.3 kg ? ?BMI:  Body mass index is 14.64 kg/m?. ? ?Estimated Nutritional Needs:  ?Kcal:  1700-1900 kcal/d ?Protein:  85-100 g/d ?Fluid:  >/=1.8 L/d ? ? ?Ranell Patrick, RD, LDN ?Clinical Dietitian ?RD pager # available in [place]  ?After hours/weekend pager # available in [place] ?

## 2022-03-30 NOTE — Care Management (Signed)
?  ?  Durable Medical Equipment  ?(From admission, onward)  ?  ? ? ?  ? ?  Start     Ordered  ? 03/30/22 1103  For home use only DME Hospital bed  Once       ?Question Answer Comment  ?Length of Need 6 Months   ?The above medical condition requires: Patient requires the ability to reposition frequently   ?Head must be elevated greater than: 30 degrees   ?Bed type Semi-electric   ?Support Surface: Gel Overlay   ?  ? 03/30/22 1103  ? ?  ?  ? ?  ?  ?

## 2022-03-30 NOTE — TOC Initial Note (Addendum)
Transition of Care (TOC) - Initial/Assessment Note  ? ? ?Patient Details  ?Name: Claire [name] ?MRN: [id number] ?Date of Birth: 1950/12/26 ? ?Transition of Care (TOC) CM/SW Contact:    ?Pollie Friar, RN ?Phone Number: ?03/30/2022, 11:07 AM ? ?Clinical Narrative:                 ?Patient is only oriented to self. CM reached out to her daughter over the phone. Daughter is cleaning patients apartment as she has to be out on Sunday. Patient has been staying with daughter at her home. Daughter is disabled but has been providing supervision and assistance with ADL's at home. She is interested in some assistance at home. CM encouraged her to contact DSS to see about her qualifying for medicaid for aide services and LTC. Daughter states after the apartment is gone patient should have the funds to get caregivers to assist the daughter at home. CM will provide her some resources for this also. Daughter thinking LT care may be in the future and is agreeable to having [facility] reach out to her. CM will provide them with the daughters information.  ?Patient has 24 hour supervision at home between the daughter and patient's son. They use baby monitors to assist in monitoring her in the home.  ?Pt is supposed to have palliative care through [facility] but has not heard from them in 2 weeks and no one has visited the home. CM will f/u with [facility].  ?Daughter and son have petitioned the court for guardianship over the patient. Daughter states it has been postponed 2 times.  ?Daughter requesting a hospital bed for home and bipap. Pt has not had sleep study so will not qualify for a bipap at home. CM will inquired with MD about a trilogy until she has a sleep study.  ?TOC following. ? ?1121: spoke to [facility] palliative care: [phone]. They are active with the patient but have not been to the home. They will see after d/c. Need d/c summary faxed at d/c to: [phone] ? ?Expected Discharge Plan: [place] ?Barriers to Discharge: Continued Medical Work up ? ? ?Patient Goals and CMS Choice ?  ?CMS Medicare.gov Compare Post Acute Care list provided to:: Patient Represenative (must comment) ?Choice offered to / list presented to : Adult Children ? ?Expected Discharge Plan and Services ?Expected Discharge Plan: [place] ?  ?Discharge Planning Services: CM Consult ?Post Acute Care Choice: Durable Medical Equipment, Home Health ?Living arrangements for the past 2 months: [place] ?                ?DME Arranged: Hospital bed ?DME Agency: [facility] ?  ?  ?  ?HH Arranged: PT, OT, Social Work, Nurse's Aide ?  ?  ?  ?  ? ?Prior Living Arrangements/Services ?Living arrangements for the past 2 months: [place] ?Lives with:: Adult Children ?Patient language and need for interpreter reviewed:: Yes ?       ?Need for Family Participation in Patient Care: Yes (Comment) ?Care giver support system in place?: Yes (comment) ?Current home services: DME (walker/ 3 in 1/ oxygen with [employer]) ?Criminal Activity/Legal Involvement Pertinent to Current Situation/Hospitalization: No - Comment as needed ? ?Activities of Daily Living ?  ?  ? ?Permission Sought/Granted ?  ?  ?   ?   ?   ?   ? ?Emotional Assessment ?  ?  ?  ?Orientation: : Oriented to  Self ?  ?Psych Involvement: No (comment) ? ?Admission diagnosis:  Hyperkalemia [E87.5] ?Encephalopathy [G93.40] ?Acute encephalopathy [G93.40] ?Patient Active Problem List  ? Diagnosis Date Noted  ? Acute encephalopathy 03/28/2022  ? Right upper quadrant abdominal pain 03/25/2022  ? Right lower quadrant abdominal pain 03/23/2022  ? Confusion 03/23/2022  ? History of fall 03/23/2022  ? Essential hypertension 03/13/2022  ? Antibiotic-associated diarrhea 03/11/2022  ? Goals of care, counseling/discussion 03/10/2022  ? COPD (chronic obstructive pulmonary disease) ([place]) 03/08/2022  ? Acute metabolic encephalopathy [id number]  ? Anxiety and depression/PTSD/recent  SI attempt 03/06/2022  ? Mycobacterium avium infection with cavitory lesion 02/10/2022  ? Cavitary lung disease 02/08/2022  ? Personality disorder ([place]) 01/20/2022  ? Chronic respiratory failure with hypoxia and hypercapnia ([place]) 01/07/2022  ? Hypomagnesemia 01/07/2022  ? Tobacco use 01/07/2022  ? Protein-calorie malnutrition, severe ([place]) 01/07/2022  ? Physical debility 01/07/2022  ? Cavitary pneumonia 01/06/2022  ? Multifocal pneumonia 01/03/2022  ? Severe malnutrition ([place]) 01/03/2022  ? AKI (acute kidney injury) ([place]) 01/03/2022  ? Weakness 12/31/2021  ? Current smoker 11/03/2021  ? Stress due to family tension 09/02/2021  ? Nasal congestion 08/06/2021  ? Medication management 03/15/2021  ? History of Clostridioides difficile colitis 02/22/2021  ? Mild protein-calorie malnutrition ([place]) 01/19/2021  ? Clostridium difficile colitis 09/11/2020  ? Lower extremity edema 07/29/2020  ? Constipation 07/13/2020  ? Body mass index (BMI) of 19 or less in adult 05/08/2020  ? Malnutrition of mild degree ([place]) 05/06/2020  ? Anxiety about health 03/23/2020  ? Pernicious anemia 03/23/2020  ? Macrocytic anemia 03/10/2020  ? Elevated ferritin 03/10/2020  ? Irritable bowel syndrome with diarrhea 03/10/2020  ? Chronic rhinitis 01/31/2020  ? Menopausal vaginal dryness 12/24/2019  ? History of hypokalemia 10/09/2019  ? Leg cramps 05/29/2019  ? Recurrent UTI 05/07/2019  ? DOE (dyspnea on exertion) 05/09/2018  ? High serum parathyroid hormone (PTH) 07/19/2017  ? Normocytic anemia 07/19/2017  ? Low serum vitamin B12 05/14/2017  ? Vitamin D deficiency 05/14/2017  ? Depressed mood 05/14/2017  ? Anemia of chronic disease 05/11/2017  ? Stage 3a chronic kidney disease ([place]) 01/16/2017  ? Iron deficiency anemia 01/16/2017  ? Centrilobular emphysema ([place]) 12/20/2016  ? Hyponatremia 12/19/2016  ? Hyperkalemia 12/19/2016  ? Chronic use of benzodiazepine for therapeutic purpose 08/11/2015  ? Acquired hypothyroidism 11/24/2014  ? BP (high blood  pressure) 07/24/2012  ? PTSD (post-traumatic stress disorder) 11/10/2011  ?  Class: Question of  ? Neurosis, anxiety, panic type 11/10/2011  ?  Class: Chronic  ? ?PCP:  Donella Stade, PA-C ?Pharmacy:   ?[facility], [address] ?[place] [address] ?Phone: [phone] Fax: [phone] ? ? ? ? ?Social Determinants of Health (SDOH) Interventions ?  ? ?Readmission Risk Interventions ? ?  03/09/2022  ?  4:04 PM  ?Readmission Risk Prevention Plan  ?Transportation Screening Complete  ?Combine or Home Care Consult Complete  ?Social Work Consult for [place] Planning/Counseling Complete  ?Palliative Care Screening Complete  ? ? ? ?

## 2022-03-31 DIAGNOSIS — K589 Irritable bowel syndrome without diarrhea: Secondary | ICD-10-CM

## 2022-03-31 DIAGNOSIS — G9340 Encephalopathy, unspecified: Secondary | ICD-10-CM | POA: Diagnosis not present

## 2022-03-31 DIAGNOSIS — N179 Acute kidney failure, unspecified: Secondary | ICD-10-CM | POA: Diagnosis not present

## 2022-03-31 DIAGNOSIS — K219 Gastro-esophageal reflux disease without esophagitis: Secondary | ICD-10-CM

## 2022-03-31 DIAGNOSIS — E039 Hypothyroidism, unspecified: Secondary | ICD-10-CM | POA: Diagnosis not present

## 2022-03-31 DIAGNOSIS — F419 Anxiety disorder, unspecified: Secondary | ICD-10-CM | POA: Diagnosis not present

## 2022-03-31 LAB — CBC
HCT: 21.4 % — ABNORMAL LOW (ref 36.0–46.0)
Hemoglobin: 6.6 g/dL — CL (ref 12.0–15.0)
MCH: 31.4 pg (ref 26.0–34.0)
MCHC: 30.8 g/dL (ref 30.0–36.0)
MCV: 101.9 fL — ABNORMAL HIGH (ref 80.0–100.0)
Platelets: 385 10*3/uL (ref 150–400)
RBC: 2.1 MIL/uL — ABNORMAL LOW (ref 3.87–5.11)
RDW: 15.2 % (ref 11.5–15.5)
WBC: 7.8 10*3/uL (ref 4.0–10.5)
nRBC: 0 % (ref 0.0–0.2)

## 2022-03-31 LAB — PREPARE RBC (CROSSMATCH)

## 2022-03-31 LAB — HEMOGLOBIN AND HEMATOCRIT, BLOOD
HCT: 27.3 % — ABNORMAL LOW (ref 36.0–46.0)
Hemoglobin: 8.9 g/dL — ABNORMAL LOW (ref 12.0–15.0)

## 2022-03-31 LAB — ABO/RH: ABO/RH(D): O POS

## 2022-03-31 MED ORDER — DICYCLOMINE HCL 10 MG PO CAPS
20.0000 mg | ORAL_CAPSULE | Freq: Three times a day (TID) | ORAL | Status: DC | PRN
  Administered 2022-03-31: 20 mg via ORAL
  Filled 2022-03-31 (×3): qty 2

## 2022-03-31 MED ORDER — SODIUM CHLORIDE 0.9% IV SOLUTION: Freq: Once | INTRAVENOUS | Status: AC

## 2022-03-31 MED ORDER — OLANZAPINE 2.5 MG PO TABS
2.5000 mg | ORAL_TABLET | Freq: Two times a day (BID) | ORAL | Status: DC
  Administered 2022-03-31 – 2022-04-01 (×2): 2.5 mg via ORAL
  Filled 2022-03-31 (×2): qty 1
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
## 2022-03-31 NOTE — Progress Notes (Signed)
Physical Therapy Treatment ?Patient Details ?Name: Claire [name] ?MRN: [id number] ?DOB: [birth year] ?Today's Date: 03/31/2022 ? ? ?History of Present Illness Claire [name] is a 71 y.o. female presenting with altered mentation.PMH: PTSD, anxiety, depression, personality d/o, chronic respiratory failure, COPD, anemia, hypothyroidism, MAC infection with cavitary lesion, CKD 3, HTN, on 3 liters of O2 at home. ? ?  ?PT Comments  ? ? Continued progression of functional mobility and gait training today. The patient reports being tired today and has low Hgb per RN. The patient tolerated well but was limited secondary to SOB and fatigue.  Pt. Shows overall improvement with her functional mobility and balance. Overall strength, balance, and functional endurance are still limiting function. She ambulated 50 feet with RW and performed STS x5 with deep breathing between, requiring a momentary bump to 3L NCO2 during standing rest break, with SPO2 climbing to 100%. She was educated on nasal breathing and pursed lip breathing to help control O2 saturation, with 2L NCO2 and SPO2 >95%. Pt. Would benefit from skilled PT to continue to address further functional mobility, strength, balance, and endurance training as tolerated. Plan and discharge setting remains unchanged. Pt to follow acutely as appropriate.  ?   ?Recommendations for follow up therapy are one component of a multi-disciplinary discharge planning process, led by the attending physician.  Recommendations may be updated based on patient status, additional functional criteria and insurance authorization. ? ?Follow Up Recommendations ? Home health PT ?  ?  ?Assistance Recommended at Discharge Frequent or constant Supervision/Assistance  ?Patient can return home with the following A little help with walking and/or transfers;A little help with bathing/dressing/bathroom;Direct supervision/assist for medications management;Direct supervision/assist for financial management;Assist  for transportation;Help with stairs or ramp for entrance ?  ?Equipment Recommendations ? None recommended by PT  ?  ?   ?Precautions / Restrictions Precautions ?Precautions: Fall ?Precaution Comments: monitor O2 ?Restrictions ?Weight Bearing Restrictions: No  ?  ? ?Mobility ? Bed Mobility ?Overal bed mobility: Needs Assistance ?  ?  ?  ?  ?  ?  ?General bed mobility comments: pt sitting up at EOB ?  ? ?Transfers ?Overall transfer level: Needs assistance ?Equipment used: Rolling walker (2 wheels) ?Transfers: Sit to/from Stand ?Sit to Stand: Supervision ?  ?  ?  ?  ?  ?General transfer comment: STS x6 supervision level, pt. would desat from 100->95% every STS and with cuing and breathing through her nose the saturation would go back to 100% ?  ? ?Ambulation/Gait ?Ambulation/Gait assistance: Min guard ?Gait Distance (Feet): 50 Feet ?Assistive device: Rolling walker (2 wheels) ?Gait Pattern/deviations: Step-through pattern, Decreased stride length, Trunk flexed, Narrow base of support ?Gait velocity: decreased ?  ?  ?General Gait Details: Pt not feeling well to and complains of SOB after short distance gait. During standing rest break 02 stats had dropped and she was momentarily bumped up to 3L NCO2 and cued for pursed lip breathing to all recovery to 100%, then placed back on 2L NCO2 for the rest of the session. ? ? ? ? ? ?  ?Balance Overall balance assessment: Needs assistance ?Sitting-balance support: No upper extremity supported, Feet supported ?Sitting balance-Leahy Scale: Fair ?Sitting balance - Comments: Sitting EOB without issue ?  ?Standing balance support: Bilateral upper extremity supported ?  ?Standing balance comment: Tolerated multiple STS with RW, able to stand without but benefits from RW use during dynamic movement. ?  ?  ?  ?  ?  ?  ?  ?  ?  ?  ?  ?  ? ?  ?  Cognition Arousal/Alertness: Awake/alert ?Behavior During Therapy: [facility] for tasks assessed/performed ?Overall Cognitive Status: No family/caregiver  present to determine baseline cognitive functioning ?Area of Impairment: Safety/judgement, Attention, Problem solving ?  ?  ?  ?  ?  ?  ?  ?  ?  ?Current Attention Level: Selective ?  ?  ?Safety/Judgement: Decreased awareness of safety ?  ?Problem Solving: Slow processing, Decreased initiation, Difficulty sequencing ?General Comments: Pt. self limited and has to be reminded multiple times to breathe out of her nose. Has trouble navigating room with RW and slams it into the table instead of moving around it. ?  ?  ? ?  ?   ?General Comments General comments (skin integrity, edema, etc.): Pt's O2 on 3 liters 100%, removed O2 for pt to ambulate in room and sats dropped as low as 93%. O2 reapplied at 3 liters. ?  ?  ? ?Pertinent Vitals/Pain Pain Assessment ?Pain Assessment: No/denies pain  ? ? ?Home Living Family/patient expects to be discharged to:: Private residence ?Living Arrangements: Children ?Available Help at Discharge: Family;Available 24 hours/day ?Type of Home: House ?Home Access: Stairs to enter ?  ?Entrance Stairs-Number of Steps: 3 ?  ?Home Layout: One level ?Home Equipment: Conservation officer, nature (2 wheels) ?   ?  ?   ? ?PT Goals (current goals can now be found in the care plan section) Acute Rehab PT Goals ?Patient Stated Goal: To go home ?PT Goal Formulation: With patient ?Time For Goal Achievement: 04/13/22 ?Potential to Achieve Goals: Good ?Progress towards PT goals: Progressing toward goals ? ?  ?Frequency ? ? ? Min 3X/week ? ? ? ?  ?PT Plan Current plan remains appropriate  ? ? ?   ?AM-PAC PT "6 Clicks" Mobility   ?Outcome Measure ? Help needed turning from your back to your side while in a flat bed without using bedrails?: A Little ?Help needed moving from lying on your back to sitting on the side of a flat bed without using bedrails?: A Little ?Help needed moving to and from a bed to a chair (including a wheelchair)?: A Little ?Help needed standing up from a chair using your arms (e.g., wheelchair or  bedside chair)?: A Little ?Help needed to walk in hospital room?: A Little ?Help needed climbing 3-5 steps with a railing? : A Lot ?6 Click Score: 17 ? ?  ?End of Session Equipment Utilized During Treatment: Gait belt ?Activity Tolerance: Patient limited by fatigue (SOB) ?Patient left: with nursing/sitter in room;in bed;with bed alarm set;with call bell/phone within reach ?Nurse Communication: Mobility status ?PT Visit Diagnosis: Unsteadiness on feet (R26.81);Difficulty in walking, not elsewhere classified (R26.2) ?  ? ? ?Time: 1696-7893 ?PT Time Calculation (min) (ACUTE ONLY): 18 min ? ?Charges:  $Therapeutic Activity: 8-22 mins          ?          ? ?Thermon Leyland, SPT ?Acute Rehab Services ? ? ? ?Thermon Leyland ?03/31/2022, 2:26 PM ? ?
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
## 2022-03-31 NOTE — Evaluation (Signed)
Occupational Therapy Evaluation ?Patient Details ?Name: Claire [name] ?MRN: [id number] ?DOB: 1951/03/27 ?Today's Date: 03/31/2022 ? ? ?History of Present Illness Claire [name] is a 71 y.o. female presenting with altered mentation.PMH: PTSD, anxiety, depression, personality d/o, chronic respiratory failure, COPD, anemia, hypothyroidism, MAC infection with cavitary lesion, CKD 3, HTN, on 3 liters of O2 at home.  ? ?Clinical Impression ?  ?This 71 yo female admitted with above presents to acute OT with PLOF per her report of being independent with all basic ADLs and helping with IADLs. Currently she is at a setup/S-min guard A level. She will continue to benefit from acute OT with follow up [place].  ?   ? ?Recommendations for follow up therapy are one component of a multi-disciplinary discharge planning process, led by the attending physician.  Recommendations may be updated based on patient status, additional functional criteria and insurance authorization.  ? ?Follow Up Recommendations ? Home health OT  ?  ?Assistance Recommended at Discharge Frequent or constant Supervision/Assistance  ?Patient can return home with the following A little help with walking and/or transfers;A little help with bathing/dressing/bathroom;Assistance with cooking/housework;Direct supervision/assist for financial management;Direct supervision/assist for medications management;Help with stairs or ramp for entrance;Assist for transportation ? ?  ?Functional Status Assessment ? Patient has had a recent decline in their functional status and demonstrates the ability to make significant improvements in function in a reasonable and predictable amount of time.  ?Equipment Recommendations ? None recommended by OT  ?  ?   ?Precautions / Restrictions Precautions ?Precautions: Fall ?Precaution Comments: monitor O2 ?Restrictions ?Weight Bearing Restrictions: No  ? ?  ? ?Mobility Bed Mobility ?Overal bed mobility: Needs Assistance ?Bed Mobility: Supine to  Sit ?  ?  ?Supine to sit: Supervision ?  ?  ?  ?  ? ?Transfers ?Overall transfer level: Needs assistance ?Equipment used: None ?Transfers: Sit to/from Stand ?Sit to Stand: Min guard ?  ?  ?  ?  ?  ?  ?  ? ?  ?Balance Overall balance assessment: Needs assistance ?Sitting-balance support: No upper extremity supported, Feet supported ?Sitting balance-Leahy Scale: Fair ?  ?  ?Standing balance support: No upper extremity supported ?Standing balance-Leahy Scale: Fair ?Standing balance comment: standing to pull up pants ?  ?  ?  ?  ?  ?  ?  ?  ?  ?  ?  ?   ? ?ADL either performed or assessed with clinical judgement  ? ?ADL Overall ADL's : Needs assistance/impaired ?Eating/Feeding: Independent;Sitting ?Eating/Feeding Details (indicate cue type and reason): EOB ?Grooming: Set up;Sitting;Supervision/safety ?Grooming Details (indicate cue type and reason): EOB ?Upper Body Bathing: Set up;Supervision/ safety;Sitting ?Upper Body Bathing Details (indicate cue type and reason): EOB ?Lower Body Bathing: Min guard;Sit to/from stand ?  ?Upper Body Dressing : Set up;Sitting;Supervision/safety ?Upper Body Dressing Details (indicate cue type and reason): EOB ?Lower Body Dressing: Min guard;Sit to/from stand ?  ?Toilet Transfer: Min guard;Ambulation ?Toilet Transfer Details (indicate cue type and reason): No AD, bed>door>sit EOB ?Toileting- Water quality scientist and Hygiene: Min guard;Sit to/from stand ?  ?  ?  ?  ?   ? ? ? ?Vision Patient Visual Report: No change from baseline ?   ?   ?   ?   ? ?Pertinent Vitals/Pain Pain Assessment ?Pain Assessment: No/denies pain  ? ? ? ?Hand Dominance  right ?  ?Extremity/Trunk Assessment Upper Extremity Assessment ?Upper Extremity Assessment: Overall WFL for tasks assessed ?  ?  ?  ?  ?  ?  Communication Communication ?Communication: No difficulties ?  ?Cognition Arousal/Alertness: Awake/alert ?Behavior During Therapy: [facility] for tasks assessed/performed ?Overall Cognitive Status:  (psych history) ?Area  of Impairment: Safety/judgement, Awareness ?  ?  ?  ?  ?  ?  ?  ?  ?  ?Current Attention Level: Selective ?  ?  ?Safety/Judgement: Decreased awareness of safety ?Awareness: Emergent ?  ?General Comments: Not anxious at all today, did ask why she had to get up with me but once explained she did it readily ?  ?  ?General Comments  Pt's O2 on 3 liters 100%, removed O2 for pt to ambulate in room and sats dropped as low as 93%. O2 reapplied at 3 liters. ? ?  ?   ?   ? ? ?Home Living Family/patient expects to be discharged to:: Private residence ?Living Arrangements: Children ?Available Help at Discharge: Family;Available 24 hours/day ?Type of Home: House ?Home Access: Stairs to enter ?Entrance Stairs-Number of Steps: 3 ?  ?Home Layout: One level ?  ?  ?Bathroom Shower/Tub: Tub/shower unit ?  ?  ?  ?  ?Home Equipment: Conservation officer, nature (2 wheels) ?  ?  ?  ? ?  ?Prior Functioning/Environment Prior Level of Function : Independent/Modified Independent ?  ?  ?  ?  ?  ?  ?  ?ADLs Comments: Pt reports she was independent with bathing, dressing toileting and helped some with IADLs ?  ? ?  ?  ?OT Problem List: Impaired balance (sitting and/or standing) ?  ?   ?OT Treatment/Interventions: Self-care/ADL training;DME and/or AE instruction;Patient/family education;Balance training  ?  ?OT Goals(Current goals can be found in the care plan section) Acute Rehab OT Goals ?Patient Stated Goal: to go home ?OT Goal Formulation: With patient ?Time For Goal Achievement: 04/14/22 ?Potential to Achieve Goals: Good  ?OT Frequency: Min 2X/week ?  ? ?   ?AM-PAC OT "6 Clicks" Daily Activity     ?Outcome Measure Help from another person eating meals?: None ?Help from another person taking care of personal grooming?: A Little ?Help from another person toileting, which includes using toliet, bedpan, or urinal?: A Little ?Help from another person bathing (including washing, rinsing, drying)?: A Little ?Help from another person to put on and taking off  regular upper body clothing?: A Little ?Help from another person to put on and taking off regular lower body clothing?: A Little ?6 Click Score: 19 ?  ?End of Session Equipment Utilized During Treatment: Gait belt ?Nurse Communication:  (Doing well today, once pt gets her 1 unit of blood okay for sitter to walk with her to door and back with gait belt on.) ? ?Activity Tolerance: Patient tolerated treatment well ?Patient left:  (sitting EOB, sitter in room) ? ?OT Visit Diagnosis: Unsteadiness on feet (R26.81);Other abnormalities of gait and mobility (R26.89);Other symptoms and signs involving cognitive function  ?              ?Time: 0569-7948 ?OT Time Calculation (min): 21 min ?Charges:  OT General Charges ?$OT Visit: 1 Visit ?OT Evaluation ?$OT Eval Moderate Complexity: 1 Mod ? ?[place], OTR/L ?Acute Rehab Services ?Pager [phone] ?Office [phone] ? ? ? ?Almon Register ?03/31/2022, 12:54 PM ?

## 2022-03-31 NOTE — Consult Note (Signed)
? ?  [facility] CM Inpatient Consult ? ? ?03/31/2022 ? ?[name] ?09/03/1951 ?[id number] ? ?[facility] Customer service manager [THN]  Occupational hygienist [ACO] Patient: Claire [name] ? ?Primary Care Provider:  Nolene Ebbs, [facility], Kathryne Sharper,  is an embedded provider with a Chronic Care Management team and program, and is listed for the transition of care follow up and appointments. ? ?Patient was screened for readmission less than 30 days and for Embedded practice service needs for chronic care management for post hospital care. ? ?Plan: A referral can be made to the [facility] Embedded Chronic Care Management team for post hospital needs, if appropriate.  Will continue to follow progress and needs. ? ?Please contact for further questions, ? ?Charlesetta Shanks, RN BSN CCM ?[facility] [employer] Liaison ? [phone] business mobile phone ?Toll free office [phone]  ?Fax number: [phone] ?[email or web address] ?[email or web address] ? ? ? ?

## 2022-03-31 NOTE — Progress Notes (Signed)
Claire [name] presents with chronic respiratory failure due to COPD.  The use of the NIV will treat patient's high PC02 levels (75.1 with an elevated Bicarb of 44).  Therefore, NIV use can reduce risk of exacerbations and future hospitalizations when used at night and during the day.  All alternate devices [phone] and [id number]) have been proven ineffective to provide essential volume control necessary to maintain acceptable CO2 levels.  An NIV with volume-targeted pressure support is necessary to prevent patient from life-threatening harm.  Interruption or failure to provide NIV would quickly lead to exacerbation of the patient's condition, hospital re-admission, and likely harm to the patient. Continued use is preferred.  Patient is able to protect their airways and clear secretions on their own. ? ?

## 2022-03-31 NOTE — Care Management Important Message (Signed)
Important Message ? ?Patient Details  ?Name: Claire [name] ?MRN: [id number] ?Date of Birth: 1951/04/19 ? ? ?Medicare Important Message Given:  Yes ?Patient ask that I send the IM to her home address where her daughter is . ? ? ? ?Koven Belinsky ?03/31/2022, 2:59 PM ?

## 2022-03-31 NOTE — Consult Note (Addendum)
? ?                                            Consultation Note ? ? ?Referring Provider: [facility] ?PCP: Donella Stade, PA-C ?Primary Gastroenterologist: Lorne Skeens, MD with [facility] ?Reason for consultation:  acute on chronic anemia  ?Hospital Day: 4 ? ?ASSESSMENT:  ? ?Worsening of chronic anemia.  ?No overt GI bleeding. Baseline hgb 8-9 range with decline to 6.6 this admission. Iron studies have not suggested iron deficiency anemia however she takes iron at home. Her GI in [place] has previously recommended colonoscopy but she declined out of concern for not tolerating bowel prep and also sedation.   ? ?IBS, mixed.  ?Takes Bentyl as needed ? ?Severe COPD / history of cavitary MAC / multifocal infiltrates on CXR ?On home 02. ?Completed antibiotics  ? ?Anorexia, weight loss.  ?She cannot quantify weight loss but down ~ 3 pounds in less than two weeks.  ? ?See PMH for additional medical problems ? ? ?RECOMMENDATIONS:  ?Ideally when optimized from a Pulmonary standpoint she needs EGD / colonoscopy for evaluation of anemia. However she is still not very open to endoscopic evaluation.  ? ? ?Attending Physician Note  ? ?I have taken a history, reviewed the chart and examined the patient. I performed a substantive portion of this encounter, including complete performance of at least one of the key components, in conjunction with the APP. I agree with the APP's note, impression and recommendations with my edits. My additional impressions and recommendations are as follows.  ? ?*Worsening chronic anemia without evidence of overt GI bleeding. Hemoccults pending. If she demonstrates overt GI bleeding while hospitalized please contact us. When her pulmonary status returns to baseline consider colonoscopy and EGD as an outpatient with Dr. Lorne Skeens, her primary gastroenterologist with [name].  ? ?*GERD. Follow antireflux measures. Continue famotidine 20 mg po bid.  ? ?*IBS, Lomotil qid prn. Can add Bentyl if  pain, bloating are active. Avoid foods, beverages that exacerbate symptoms. ? ?*Severe COPD on home O2, multifocal infiltrates, history of cavitary MAC ? ?Claire Edward, MD [facility] ?See AM[facility], [facility], for our on call provider  ? ? ? ? ?History of Present Illness:  ?Claire [name] is a 71 y.o. female known with a past medical history of hypothyroidism, anemia of chronic disease, emphysema / chronic respiratory failure, cavitary MAC, CKD, GERD, recurrent C-Diff, IBS, HTN, cholelithiasis, panic disorder, depression, tobacco use.  See PMH for any additional medical problems. ? ?Patient was hospitalized in March at [place] for [place] overdose. She was re-hospitalized a few weeks in  ago with with metabolic encephalopathy, sepsis, pneumonia, AKI, malnutrition ? ?She presented to ED 4/24 with weakness and confusion similar to prior admissions. She was diagnosed with UTI by PCP several days ago, couldn't tolerated the antibiotics.  In ED her K+ was elevated, treated with Lokelma / bicarb. Getting antibiotics for UTI. CXR shows multifocal infiltrates. There was concern for homicidal ideation. Psychiatry has evaluated. Patient fel to have hypoactive delirium.   ? ?[name] has chronic anemia, previously declined colonoscopy recommendations by her GI provider. She has not seen any blood in her stool. No black stools. On iron at home. No abdominal pain. She has occasional constipation but says that otherwise her BMs are normal. She does does give a history of GERD,  takes Pepcid several times  a week for reflux. She has no dysphagia. She has had been unintentionally losing weight because she doesn't have an appetite.   ? ?Previous GI Evaluation / History   ? ?Recent Labs and Imaging ?DG Chest 2 View ? ?Result Date: 03/28/2022 ?CLINICAL DATA:  Confusion.  Dizziness and possible UTI. EXAM: CHEST - 2 VIEW COMPARISON:  Chest radiograph dated March 14, 2022. FINDINGS: The heart size and mediastinal contours are within normal limits.  Advanced emphysematous changes. Multifocal opacities in the left upper and lower lobe concerning for atelectasis or pneumonia or combination of both, not significantly changed from prior examination. IMPRESSION: Multifocal opacities involving the left upper and lower lobes concerning for multifocal pneumonia and/or atelectasis, unchanged. Electronically Signed   By: Keane Police D.O.   On: 03/28/2022 20:37  ? ?CT Head Wo Contrast ? ?Result Date: 03/06/2022 ?CLINICAL DATA:  Mental status change, unknown cause EXAM: CT HEAD WITHOUT CONTRAST TECHNIQUE: Contiguous axial images were obtained from the base of the skull through the vertex without intravenous contrast. RADIATION DOSE REDUCTION: This exam was performed according to the departmental dose-optimization program which includes automated exposure control, adjustment of the mA and/or kV according to patient size and/or use of iterative reconstruction technique. COMPARISON:  01/06/2022 FINDINGS: Brain: No evidence of acute cortical infarction, hemorrhage, cerebral edema, mass, mass effect, or midline shift. No hydrocephalus or extra-axial fluid collection. Hypodensity in the posterior left putamen/external capsule (series 2, image 20), which is new or more prominent compared to the prior exam. Vascular: No hyperdense vessel. Skull: Normal. Negative for fracture or focal lesion. Sinuses/Orbits: No acute finding. Other: The mastoid air cells are well aerated. IMPRESSION: IMPRESSION Hypodensity in the posterior left putamen/external capsule appears new compared to the 01/06/2022 exam but is technically age indeterminate; this may also be secondary to differences in technique. Correlate with the patient's symptoms and consider MRI if clinically indicated. These results were called by telephone at the time of interpretation on 03/06/2022 at 11:24 am to provider [facility] , who verbally acknowledged these results. Electronically Signed   By: Merilyn Baba M.D.   On:  03/06/2022 11:24  ? ?CT Chest W Contrast ? ?Result Date: 03/08/2022 ?CLINICAL DATA:  Pneumonia EXAM: CT CHEST WITH CONTRAST TECHNIQUE: Multidetector CT imaging of the chest was performed during intravenous contrast administration. RADIATION DOSE REDUCTION: This exam was performed according to the departmental dose-optimization program which includes automated exposure control, adjustment of the mA and/or kV according to patient size and/or use of iterative reconstruction technique. CONTRAST:  78m OMNIPAQUE IOHEXOL 300 MG/ML  SOLN COMPARISON:  Previous CT chest done on 02/07/2022 and chest radiograph done on 03/06/2022 FINDINGS: Cardiovascular: There is homogeneous enhancement in thoracic aorta. There is ectasia of ascending thoracic aorta measuring 3.6 cm. Scattered coronary artery calcifications are seen. There are no intraluminal filling defects in the central pulmonary artery branches. Mediastinum/Nodes: There are slightly enlarged lymph nodes in the mediastinum and both hilar regions with no significant interval change. Lungs/Pleura: Severe centrilobular and panlobular emphysema is seen. Numerous bullae are noted in both lungs, especially in the upper lung fields. Increase in interstitial markings in the periphery of right upper lung fields as not changed suggesting scarring. Small patchy infiltrates are seen in the right lower lobe. There is dense infiltrate with air bronchogram in the left upper lobe with interval decrease. There are patchy infiltrates in the left lower lobe with interval worsening. There is new 3.2 x 1.8 cm infiltrate in the anterior aspect of left lower  lobe. There are new there is 5 x 3.6 cm cavitation in the region of superior segment of left lower lobe. This finding has not changed significantly. There are scattered nodular infiltrates in the left lower lobe with no significant interval change. Patchy ground-glass infiltrates in the left lower lobe. There is elevation of posterior aspect  of left hemidiaphragm, possibly Bochdalek hernia. There is no significant pleural effusion or pneumothorax. Upper Abdomen: No significant abnormality is seen. Musculoskeletal: Unremarkable. IMPRESSION: Severe C

## 2022-03-31 NOTE — Progress Notes (Signed)
? ?PROGRESS NOTE ? ? ? ?Claire [name]  BHA:[id number] DOB: [birth year] DOA: 03/28/2022 ?PCP: Donella Stade, PA-C ? ? ?Brief Narrative: ? ?Claire [name] is a 71 y.o. female with a history of hypertension, CKD, anemia, COPD, chronic respiratory failure, MAC, hypothyroidism, hyperparathyroidism, PTSD, anxiety, depression, GERD. Patient presented secondary to altered mentation. Patient with multiple hospitalizations in the last two months. This admission, patient was started on empiric vancomycin and cefepime for possible HCAP and continue treatment of UTI. Mental status improved with antibiotics. Per daughter, patient expressed homicidal ideation toward her. ? ? ?Assessment and Plan: ? ?Acute metabolic encephalopathy ?Possibly related to UTI. Patient with underlying behavioral health diagnoses which may be contributing to confusion/agitation as well. Metabolic encephalopathy appears to be resolved, but agitation issues possibly related to behavioral diagnoses continue. ? ?Enterococcus UTI ?Recurrent UTI ?Patient was partially treated with Augmentin. Per daughter, patient was taken to the hospital because she was told she would need IV antibiotics. Enterococcus faecalis noted on PTA urine culture, sensitive to ampicillin. Patient has been managed on Vancomycin for initial concern for HCAP. ?-Discontinue Vancomycin in AM after completion of 5 days of treatment ? ?Possible homicidal ideation ?Per daughter, patient expressed homicidal ideation toward her on day of admission. Patient with recent behavioral health admission for overdose on clonazepam. Per chart review, patient was threatening suicide at the time in addition to experiencing hallucinations. Psychiatry consulted and have recommended to decrease Klonopin dosing ?-Psychiatry consult ?-Continue 1:1 sitter until cleared by psychiatry ? ?PTSD ?Anxiety ?Depression ?Personality disorder ?Zyprexa recently increased to 5 mg qhs. Patient is also on Klonopin 0.5 mg  TID ?-Continue Klonopin and Zyprexa ? ?Multifocal infiltrates ?History of cavitary MAC ?Persistent infiltrates without evidence of acute illness. Patient previously completed antibiotics. Patient will need pulmonology follow-up as an outpatient. ? ?Hyperkalemia ?Present on admission. Peak potassium of 6.1. Treated with Lokelma and now resolved. ? ?CKD stage IIIa ?Stable. ? ?Chronic hyponatremia ?Stable. Likely not contributing to mental status changes. ? ?Primary hypertension ?-Continue atenolol ? ?COPD ?Chronic respiratory failure with hypoxia and hypercapnia ?Patient is on 2 L/min of oxygen as an outpatient. Previously recommended for NIV at night. Patient prescribed BiPAP during admission but is declining use at this time. ?-Continue BiPAP qhs ?-Continue Incruse Ellipta and albuterol ?-Attempt to qualify for NIV as an outpatient ?-Will need pulmonology follow-up as an outpatient ? ?Acute on Chronic macrocytic anemia ?Continuous downward drift of hemoglobin to 6.6 today. No obvious evidence of bleeding. Patient reports not knowing if she has had a colonoscopy. She does have a gastroenterologist and sees them for GERD. Patient has some mild symptoms of fatigue. ?-Transfuse 1 unit of PRBC ?-GI consult ? ? ?DVT prophylaxis: Lovenox ?Code Status:   Code Status: DNR ?Family Communication: None at bedside ?Disposition Plan: Discharge pending improvement of hemoglobin and GI recommendations. ? ? ?Consultants:  ?Psychiatry ?[facility] ? ?Procedures:  ?None ? ?Antimicrobials: ?Vancomycin ?Cefepime  ? ? ?Subjective: ?Patient reports no issues overnight. Reports no history of GI bleeding. She does not use NSAIDs. She is unsure of any endoscopic evaluations in the past but she does have a GI physician. ? ?Objective: ?BP 106/79 (BP Location: Left Arm)   Pulse 90   Temp (!) 97.4 ?F (36.3 ?C) (Oral)   Resp 17   Ht _0  (1.6 m)   Wt 37.5 kg   SpO2 94%   BMI 14.64 kg/m?  ? ?Examination: ? ?General exam:  Appears calm and comfortable ?Respiratory system: Clear to auscultation.  Respiratory effort normal. ?Cardiovascular system: S1 & S2 heard, RRR. No murmurs, rubs, gallops or clicks. ?Gastrointestinal system: Abdomen is nondistended, soft and nontender. No organomegaly or masses felt. Normal bowel sounds heard. ?Central nervous system: Alert and oriented. No focal neurological deficits. ?Musculoskeletal: No edema. No calf tenderness ?Skin: No cyanosis. No rashes ?Psychiatry: Judgement and insight appear normal. Mood & affect appropriate.  ? ? ?Data Reviewed: I have personally reviewed following labs and imaging studies ? ?CBC ?Lab Results  ?Component Value Date  ? WBC 7.8 03/31/2022  ? RBC 2.10 (L) 03/31/2022  ? HGB 6.6 (LL) 03/31/2022  ? HCT 21.4 (L) 03/31/2022  ? MCV 101.9 (H) 03/31/2022  ? MCH 31.4 03/31/2022  ? PLT 385 03/31/2022  ? MCHC 30.8 03/31/2022  ? RDW 15.2 03/31/2022  ? LYMPHSABS 1.9 03/30/2022  ? MONOABS 0.9 03/30/2022  ? EOSABS 0.5 03/30/2022  ? BASOSABS 0.1 03/30/2022  ? ? ? ?Last metabolic panel ?Lab Results  ?Component Value Date  ? NA 133 (L) 03/30/2022  ? K 4.4 03/30/2022  ? CL 93 (L) 03/30/2022  ? CO2 33 (H) 03/30/2022  ? BUN 17 03/30/2022  ? CREATININE 0.97 03/30/2022  ? GLUCOSE 100 (H) 03/30/2022  ? GFRNONAA >60 03/30/2022  ? GFRAA 61 05/27/2021  ? CALCIUM 9.2 03/30/2022  ? PROT 8.5 (H) 03/29/2022  ? ALBUMIN 2.4 (L) 03/29/2022  ? LABGLOB 3.1 04/27/2021  ? AGRATIO 1.4 04/27/2021  ? BILITOT 0.4 03/29/2022  ? ALKPHOS 84 03/29/2022  ? AST 13 (L) 03/29/2022  ? ALT 10 03/29/2022  ? ANIONGAP 7 03/30/2022  ? ? ?GFR: ?Estimated Creatinine Clearance: 31.5 mL/min (by C-G formula based on SCr of 0.97 mg/dL). ? ?Recent Results (from the past 240 hour(s))  ?Urine Culture     Status: Abnormal  ? Collection Time: 03/23/22 12:11 PM  ? Specimen: Urine  ?Result Value Ref Range Status  ? MICRO NUMBER: [id number]  Final  ? SPECIMEN QUALITY: Adequate  Final  ? Sample Source NOT GIVEN  Final  ? STATUS: FINAL  Final  ?  ISOLATE 1: Enterococcus faecalis (A)  Final  ?  Comment: 50,000-100,000 CFU/mL of Enterococcus faecalis  ? COMMENT:   Final  ?  Additional non-predominating organism(s) isolated. These organisms, commonly found on external and internal genitalia, are considered colonizers. No further testing performed.  ?    Susceptibility  ? Enterococcus faecalis - URINE CULTURE POSITIVE 1  ?  AMPICILLIN <=2 Sensitive   ?  VANCOMYCIN <=0.5 Sensitive   ?  NITROFURANTOIN* 32 Sensitive   ?   * Legend: ?S = Susceptible  I = Intermediate ?R = Resistant  NS = Not susceptible ?* = Not tested  NR = Not reported ?**NN = See antimicrobic comments ?  ?Blood culture (routine x 2)     Status: None (Preliminary result)  ? Collection Time: 03/28/22  4:47 PM  ? Specimen: BLOOD  ?Result Value Ref Range Status  ? Specimen Description BLOOD RIGHT ARM  Final  ? Special Requests   Final  ?  BOTTLES DRAWN AEROBIC AND ANAEROBIC Blood Culture results may not be optimal due to an inadequate volume of blood received in culture bottles  ? Culture   Final  ?  NO GROWTH 2 DAYS ?Performed at [facility], [place] [address], [place], [place] [address] ?  ? Report Status PENDING  Incomplete  ?Urine Culture     Status: Abnormal  ? Collection Time: 03/29/22  2:54 AM  ? Specimen: Urine, Clean  Catch  ?Result Value Ref Range Status  ? Specimen Description URINE, CLEAN CATCH  Final  ? Special Requests NONE  Final  ? Culture (A)  Final  ?  <10,000 COLONIES/mL INSIGNIFICANT GROWTH ?Performed at [facility], [place] [address] [place], [place] [address] ?  ? Report Status 03/30/2022 FINAL  Final  ?  ? ? ?Radiology Studies: ?No results found. ? ? ? LOS: 2 days  ? ? ?Cordelia Poche, MD ?[facility] ?03/31/2022, 8:14 AM ? ? ?If 7PM-7AM, please contact night-coverage ?[email or web address] ? ?

## 2022-04-01 DIAGNOSIS — D638 Anemia in other chronic diseases classified elsewhere: Secondary | ICD-10-CM

## 2022-04-01 LAB — CBC
HCT: 28 % — ABNORMAL LOW (ref 36.0–46.0)
Hemoglobin: 8.9 g/dL — ABNORMAL LOW (ref 12.0–15.0)
MCH: 31 pg (ref 26.0–34.0)
MCHC: 31.8 g/dL (ref 30.0–36.0)
MCV: 97.6 fL (ref 80.0–100.0)
Platelets: 380 10*3/uL (ref 150–400)
RBC: 2.87 MIL/uL — ABNORMAL LOW (ref 3.87–5.11)
RDW: 16.8 % — ABNORMAL HIGH (ref 11.5–15.5)
WBC: 8.9 10*3/uL (ref 4.0–10.5)
nRBC: 0 % (ref 0.0–0.2)

## 2022-04-01 LAB — TYPE AND SCREEN
ABO/RH(D): O POS
Antibody Screen: NEGATIVE
Unit division: 0

## 2022-04-01 LAB — BPAM RBC
Blood Product Expiration Date: 202305282359
ISSUE DATE / TIME: 202304271553
Unit Type and Rh: 5100

## 2022-04-01 MED ORDER — OLANZAPINE 2.5 MG PO TABS: 2.5000 mg | ORAL_TABLET | Freq: Two times a day (BID) | ORAL | 0 refills | Status: AC

## 2022-04-01 NOTE — Progress Notes (Signed)
Nutrition Follow-up ? ?DOCUMENTATION CODES:  ?Underweight, Severe malnutrition in context of social or environmental circumstances ? ?INTERVENTION:  ?Continue regular diet ?Ensure Enlive po TID, each supplement provides 350 kcal and 20 grams of protein. ?Snacks TID between meals ? ?NUTRITION DIAGNOSIS:  ?Severe Malnutrition related to social / environmental circumstances (mental illness) as evidenced by severe fat depletion, severe muscle depletion, percent weight loss (15% x 3 months). ?- remains applicable ? ?GOAL:  ?Patient will meet greater than or equal to 90% of their needs ?- progressing, supplements in place ? ?MONITOR:  ? ?PO intake, Supplement acceptance, Weight trends, Labs ? ?REASON FOR ASSESSMENT:  ? ?Malnutrition Screening Tool ?  ? ?ASSESSMENT:  ?71 y.o. female with PMH significant for HTN, hypothyroidism, hyperparathyroidism, PTSD, anxiety, depression, GERD, COPD on 2 to 3 L oxygen, cavitary MAC, and chronic anemia presented to ED with AMS. Recently admitted with similar presentation. ? ?Pt resting in bed at the time of visit eating lunch. Stated she was finished after eating ~15% of tray (most of a sweet potato and a bite of pot roast).  ? ?Discussed pt's consistent inadequate intake with MD and the potential for placing a cortrak tube to provide enteral support. MD reports that pt will be able to be dc home with her daughter today, but that PCP may explore the possibility of a g-tube outpatient as a more long term solution vs a more comfort based approach.  ? ?Average Meal Intake: ?4/24-4/28: 28% intake x 3 recorded meals ? ?Nutritionally Relevant Medications: ?Scheduled Meds: ? famotidine  20 mg Oral BID  ? Ensure Enlive  237 mL Oral TID BM  ? ferrous sulfate  325 mg Oral Q breakfast  ? levothyroxine  75 mcg Oral Daily  ? saccharomyces boulardii  250 mg Oral Daily  ? thiamine  100 mg Oral Daily  ? ?PRN Meds: polyethylene glycol ? ?Labs Reviewed: ?Labs drawn 4/26 ?Sodium 133, chloride  93 ? ?NUTRITION - FOCUSED PHYSICAL EXAM: ?Flowsheet Row Most Recent Value  ?Orbital Region Severe depletion  ?Upper Arm Region Severe depletion  ?Thoracic and Lumbar Region Severe depletion  ?Buccal Region Severe depletion  ?Temple Region Severe depletion  ?Clavicle Bone Region Severe depletion  ?Clavicle and Acromion Bone Region Severe depletion  ?Scapular Bone Region Severe depletion  ?Dorsal Hand Severe depletion  ?Patellar Region Severe depletion  ?Anterior Thigh Region Severe depletion  ?Posterior Calf Region Severe depletion  ?Edema (RD Assessment) None  ?Hair Reviewed  ?Eyes Reviewed  ?Mouth Reviewed  ?Skin Reviewed  ?Nails Reviewed  ? ?Diet Order:   ?Diet Order   ? ?       ?  Diet regular Room service appropriate? Yes with Assist; Fluid consistency: Thin  Diet effective now       ?  ? ?  ?  ? ?  ? ? ?EDUCATION NEEDS:  ?Not appropriate for education at this time ? ?Skin:  Skin Assessment: Reviewed RN Assessment ? ?Last BM:  4/24 ? ?Height:  ?Ht Readings from Last 1 Encounters:  ?03/29/22 _0  (1.6 m)  ? ?Weight:  ?Wt Readings from Last 1 Encounters:  ?03/30/22 37.5 kg  ? ? ?Ideal Body Weight:  52.3 kg ? ?BMI:  Body mass index is 14.64 kg/m?. ? ?Estimated Nutritional Needs:  ?Kcal:  1700-1900 kcal/d ?Protein:  85-100 g/d ?Fluid:  >/=1.8 L/d ? ? ?Ranell Patrick, RD, LDN ?Clinical Dietitian ?RD pager # available in [place]  ?After hours/weekend pager # available in [place] ?

## 2022-04-01 NOTE — Discharge Summary (Signed)
?Physician Discharge Summary ?  ?Patient: Claire [name] MRN: [id number] DOB: 09/21/1951  ?Admit date:     03/28/2022  ?Discharge date: 04/01/22  ?Discharge Physician: Cordelia Poche, MD  ? ?PCP: Donella Stade, PA-C  ? ?Recommendations at discharge:  ? ?Follow-up with PCP and Psychiatrist ?Follow-up with outpatient Gastroenterologist ?Repeat CBC in 3-5 days ? ?Discharge Diagnoses: ?Principal Problem: ?  Acute encephalopathy ?Active Problems: ?  PTSD (post-traumatic stress disorder) ?  Neurosis, anxiety, panic type ?  AKI (acute kidney injury) ([place]) ?  COPD (chronic obstructive pulmonary disease) ([place]) ?  Anxiety and depression/PTSD/recent SI attempt ?  Mycobacterium avium infection with cavitory lesion ?  Anemia of chronic disease ?  Acquired hypothyroidism ?  Stage 3a chronic kidney disease ([place]) ?  Personality disorder ([place]) ?  Essential hypertension ?  Gastroesophageal reflux disease ? ?Resolved Problems: ?  * No resolved hospital problems. * ? ?Hospital Course: ?Claire [name] is a 71 y.o. female with a history of hypertension, CKD, anemia, COPD, chronic respiratory failure, MAC, hypothyroidism, hyperparathyroidism, PTSD, anxiety, depression, GERD. Patient presented secondary to altered mentation. Patient with multiple hospitalizations in the last two months. This admission, patient was started on empiric vancomycin and cefepime for possible HCAP and continue treatment of UTI. Mental status improved with antibiotics. Per daughter, patient expressed homicidal ideation toward her. Cleared by psychiatry for discharge home. ? ?Assessment and Plan: ? ?Acute metabolic encephalopathy ?Possibly related to UTI. Patient with underlying behavioral health diagnoses which may be contributing to confusion/agitation as well. Metabolic encephalopathy appears to be resolved, but agitation issues possibly related to behavioral diagnoses continue. ?  ?Enterococcus UTI ?Recurrent UTI ?Patient was partially treated with Augmentin. Per  daughter, patient was taken to the hospital because she was told she would need IV antibiotics. Enterococcus faecalis noted on PTA urine culture, sensitive to ampicillin. Patient has been managed on Vancomycin for initial concern for HCAP. Completed Vancomycin. ?  ?Possible homicidal ideation, ruled out. ?Per daughter, patient expressed homicidal ideation toward her on day of admission. Patient with recent behavioral health admission for overdose on clonazepam. Per chart review, patient was threatening suicide at the time in addition to experiencing hallucinations. Psychiatry consulted and have recommended to decrease Klonopin dosing which was discussed with family. Also with recommendation to decrease to Zyprexa 2.5 mg BID. ?  ?PTSD ?Anxiety ?Depression ?Personality disorder ?Zyprexa recently increased to 5 mg qhs. Patient is also on Klonopin 0.5 mg TID. Psychiatry with recommendations to switch to Zyprexa 2.5 mg BID ?  ?Multifocal infiltrates ?History of cavitary MAC ?Persistent infiltrates without evidence of acute illness. Patient previously completed antibiotics. Patient will need pulmonology follow-up as an outpatient. ?  ?Hyperkalemia ?Present on admission. Peak potassium of 6.1. Treated with Lokelma and now resolved. ?  ?CKD stage IIIa ?Stable. ?  ?Chronic hyponatremia ?Stable. Likely not contributing to mental status changes. ?  ?Primary hypertension ?Continue atenolol ?  ?COPD ?Chronic respiratory failure with hypoxia and hypercapnia ?Patient is on 2 L/min of oxygen as an outpatient. Previously recommended for NIV at night. Patient set up for outpatient NIV. Continue outpatient inhalers. ?  ?Acute on Chronic macrocytic anemia ?Hemoglobin down to a low of 6.6. No obvious evidence of bleeding. Patient reports not knowing if she has had a colonoscopy. She does have a gastroenterologist and sees them for GERD. Patient has some mild symptoms of fatigue. GI consulted with recommendation for outpatient follow-up.  Patient transfused 1 unit of pRBCs. Post-transfusion hemoglobin stable at 8.9. Follow-up with outpatient  GI. Continue iron supplementation. ? ?  ? ? ?Consultants: [facility] ?Procedures performed: None  ?Disposition: Home health ?Diet recommendation:  ?Regular diet ? ?DISCHARGE MEDICATION: ?Allergies as of 04/01/2022   ? ?   Reactions  ? Bactrim [sulfamethoxazole-trimethoprim]   ? ACUTE RENAL fAILURE/HYPONATREMIA/HYPERKALEMIA  ? Macrodantin [nitrofurantoin Macrocrystal] Rash  ? Ivp Dye [iodinated Contrast Media] Other (See Comments)  ? Unknown reaction  ? Lexapro [escitalopram]   ? hyponatremia  ? Medrol [methylprednisolone]   ? Hallucinations  ? Motrin [ibuprofen] Diarrhea  ? Prednisone Other (See Comments)  ? hallucinations  ? Trimethoprim   ? Fatigue/weakness/no appetite  ? Zithromax [azithromycin]   ? diarrhea  ? ?  ? ?  ?Medication List  ?  ? ?STOP taking these medications   ? ?amoxicillin-clavulanate 500-125 MG tablet ?Commonly known as: AUGMENTIN ?  ?Gerhardt's butt cream Crea ?  ?mirtazapine 7.5 MG tablet ?Commonly known as: REMERON ?  ? ?  ? ?TAKE these medications   ? ?acetaminophen 500 MG tablet ?Commonly known as: TYLENOL ?Take 1,000 mg by mouth every 6 (six) hours as needed for moderate pain. ?  ?albuterol 108 (90 Base) MCG/ACT inhaler ?Commonly known as: VENTOLIN HFA ?INHALE 1-2 PUFFS INTO THE LUNGS EVERY 2 HOURS AS NEEDED FOR WHEEZING OR SHORTNESS OF BREATH. ?What changed: See the new instructions. ?  ?AMBULATORY NON FORMULARY MEDICATION ?Continuous stationary and portable oxygen tanks for use with ambulation. DX: COPD with hypoxia. ?  ?AMBULATORY NON FORMULARY MEDICATION ?Medication Name: full face mask to use with oxygen ?  ?AMBULATORY NON FORMULARY MEDICATION ?Full face mask for night time O2 ?DX: J96.11 ?  ?AMBULATORY NON FORMULARY MEDICATION ?Medication Name: Bipap and supplies.  Inspiratory pressure: 10,  Expiratory pressure: 5 ?  ?atenolol 25 MG tablet ?Commonly known as:  TENORMIN ?Take 1 tablet (25 mg total) by mouth 2 (two) times daily. ?  ?Budesonide ER 9 MG Tb24 ?Take 9 mg by mouth daily. ?  ?cholecalciferol 25 MCG (1000 UNIT) tablet ?Commonly known as: VITAMIN D ?Take 1,000 Units by mouth daily. ?  ?clonazePAM 0.5 MG tablet ?Commonly known as: KLONOPIN ?Take 1 tablet (0.5 mg total) by mouth 3 (three) times daily. ?  ?dicyclomine 10 MG capsule ?Commonly known as: BENTYL ?Take 20 mg by mouth 3 (three) times daily before meals. ?  ?diphenoxylate-atropine 2.5-0.025 MG tablet ?Commonly known as: LOMOTIL ?Take 1 tablet by mouth 4 (four) times daily as needed for diarrhea or loose stools. ?  ?Dulera 100-5 MCG/ACT Aero ?Generic drug: mometasone-formoterol ?Inhale 2 puffs into the lungs 2 (two) times daily. ?  ?estradiol 0.1 MG/GM vaginal cream ?Commonly known as: ESTRACE ?PLACE ONE APPLICATOR FULL THREE TIMES A WEEK AND ONE PEA SIZED AMOUNT APPLIED TO URETHRAL OPENING ?What changed: See the new instructions. ?  ?famotidine 20 MG tablet ?Commonly known as: PEPCID ?TAKE 1 TABLET BY MOUTH TWICE A DAY ?What changed:  ?when to take this ?reasons to take this ?  ?feeding supplement Liqd ?Take 237 mLs by mouth 3 (three) times daily between meals. ?  ?ferrous sulfate 325 (65 FE) MG EC tablet ?1 tab PO TID every other day ?What changed:  ?how much to take ?how to take this ?when to take this ?additional instructions ?  ?FLORAJEN ACIDOPHILUS PO ?Take 1 capsule by mouth daily. ?  ?ipratropium-albuterol 0.5-2.5 (3) MG/3ML Soln ?Commonly known as: DUONEB ?Take 3 mLs by nebulization every 4 (four) hours as needed (wheeze, SOB). ?  ?levothyroxine 75 MCG tablet ?Commonly known as: SYNTHROID ?Take 1  tablet (75 mcg total) by mouth daily. ?  ?Magnesium 250 MG Tabs ?Take 250 mg by mouth daily. ?  ?OLANZapine 2.5 MG tablet ?Commonly known as: ZYPREXA ?Take 1 tablet (2.5 mg total) by mouth 2 (two) times daily. ?What changed: when to take this ?  ?saccharomyces boulardii 250 MG capsule ?Commonly known as:  FLORASTOR ?Take 1 capsule (250 mg total) by mouth 2 (two) times daily. ?  ?Spiriva Respimat 2.5 MCG/ACT Aers ?Generic drug: Tiotropium Bromide Monohydrate ?Inhale 2 puffs into the lungs daily. ?  ?thiamine 100 M

## 2022-04-02 LAB — CULTURE, BLOOD (ROUTINE X 2): Culture: NO GROWTH

## 2022-04-03 DIAGNOSIS — J449 Chronic obstructive pulmonary disease, unspecified: Secondary | ICD-10-CM | POA: Diagnosis not present

## 2022-04-04 ENCOUNTER — Other Ambulatory Visit: Payer: Self-pay | Admitting: Physician Assistant

## 2022-04-04 ENCOUNTER — Other Ambulatory Visit: Payer: Self-pay

## 2022-04-04 DIAGNOSIS — J439 Emphysema, unspecified: Secondary | ICD-10-CM

## 2022-04-04 DIAGNOSIS — J9611 Chronic respiratory failure with hypoxia: Secondary | ICD-10-CM

## 2022-04-04 DIAGNOSIS — N1831 Chronic kidney disease, stage 3a: Secondary | ICD-10-CM

## 2022-04-04 NOTE — Telephone Encounter (Signed)
Pt has hospital bed at this time.

## 2022-04-05 ENCOUNTER — Other Ambulatory Visit: Payer: Self-pay

## 2022-04-05 ENCOUNTER — Emergency Department (HOSPITAL_COMMUNITY)
Admission: EM | Admit: 2022-04-05 | Discharge: 2022-04-06 | Disposition: A | Discharge status: Home / Self Care | Payer: Medicare Other | Attending: Emergency Medicine | Admitting: Emergency Medicine

## 2022-04-05 ENCOUNTER — Encounter (HOSPITAL_COMMUNITY): Payer: Self-pay | Admitting: Emergency Medicine

## 2022-04-05 DIAGNOSIS — R45851 Suicidal ideations: Secondary | ICD-10-CM | POA: Insufficient documentation

## 2022-04-05 DIAGNOSIS — R9431 Abnormal electrocardiogram [ECG] [EKG]: Secondary | ICD-10-CM | POA: Diagnosis not present

## 2022-04-05 DIAGNOSIS — R4182 Altered mental status, unspecified: Secondary | ICD-10-CM | POA: Diagnosis not present

## 2022-04-05 DIAGNOSIS — R456 Violent behavior: Secondary | ICD-10-CM | POA: Insufficient documentation

## 2022-04-05 DIAGNOSIS — R4689 Other symptoms and signs involving appearance and behavior: Secondary | ICD-10-CM | POA: Diagnosis present

## 2022-04-05 DIAGNOSIS — F4310 Post-traumatic stress disorder, unspecified: Secondary | ICD-10-CM | POA: Diagnosis not present

## 2022-04-05 DIAGNOSIS — Z20822 Contact with and (suspected) exposure to covid-19: Secondary | ICD-10-CM | POA: Diagnosis not present

## 2022-04-05 LAB — URINALYSIS, ROUTINE W REFLEX MICROSCOPIC
Bilirubin Urine: NEGATIVE
Glucose, UA: NEGATIVE mg/dL
Hgb urine dipstick: NEGATIVE
Ketones, ur: NEGATIVE mg/dL
Leukocytes,Ua: NEGATIVE
Nitrite: NEGATIVE
Protein, ur: NEGATIVE mg/dL
Specific Gravity, Urine: 1.01 (ref 1.005–1.030)
pH: 5 (ref 5.0–8.0)

## 2022-04-05 LAB — BASIC METABOLIC PANEL
Anion gap: 10 (ref 5–15)
BUN: 20 mg/dL (ref 8–23)
CO2: 33 mmol/L — ABNORMAL HIGH (ref 22–32)
Calcium: 9.3 mg/dL (ref 8.9–10.3)
Chloride: 92 mmol/L — ABNORMAL LOW (ref 98–111)
Creatinine, Ser: 0.79 mg/dL (ref 0.44–1.00)
GFR, Estimated: >60 mL/min (ref >60)
Glucose, Bld: 142 mg/dL — ABNORMAL HIGH (ref 70–99)
Potassium: 4.9 mmol/L (ref 3.5–5.1)
Sodium: 135 mmol/L (ref 135–145)

## 2022-04-05 LAB — RESP PANEL BY RT-PCR (FLU A&B, COVID) ARPGX2
Influenza A by PCR: NEGATIVE
Influenza B by PCR: NEGATIVE
SARS Coronavirus 2 by RT PCR: NEGATIVE

## 2022-04-05 LAB — CBC
HCT: 28.7 % — ABNORMAL LOW (ref 36.0–46.0)
Hemoglobin: 8.8 g/dL — ABNORMAL LOW (ref 12.0–15.0)
MCH: 32.4 pg (ref 26.0–34.0)
MCHC: 30.7 g/dL (ref 30.0–36.0)
MCV: 105.5 fL — ABNORMAL HIGH (ref 80.0–100.0)
Platelets: 351 10*3/uL (ref 150–400)
RBC: 2.72 MIL/uL — ABNORMAL LOW (ref 3.87–5.11)
RDW: 15 % (ref 11.5–15.5)
WBC: 11.6 10*3/uL — ABNORMAL HIGH (ref 4.0–10.5)
nRBC: 0 % (ref 0.0–0.2)

## 2022-04-05 LAB — CBG MONITORING, ED: Glucose-Capillary: 132 mg/dL — ABNORMAL HIGH (ref 70–99)

## 2022-04-05 MED ORDER — MOMETASONE FURO-FORMOTEROL FUM 100-5 MCG/ACT IN AERO
2.0000 | INHALATION_SPRAY | Freq: Two times a day (BID) | RESPIRATORY_TRACT | Status: DC
  Administered 2022-04-06: 2 via RESPIRATORY_TRACT
  Filled 2022-04-05: qty 8.8

## 2022-04-05 MED ORDER — ENSURE ENLIVE PO LIQD
237.0000 mL | Freq: Three times a day (TID) | ORAL | Status: DC
  Administered 2022-04-06 (×2): 237 mL via ORAL
  Filled 2022-04-05 (×2): qty 237

## 2022-04-05 MED ORDER — FAMOTIDINE 20 MG PO TABS: 20.0000 mg | ORAL_TABLET | Freq: Two times a day (BID) | ORAL | Status: DC | PRN

## 2022-04-05 MED ORDER — ATENOLOL 25 MG PO TABS
25.0000 mg | ORAL_TABLET | Freq: Two times a day (BID) | ORAL | Status: DC
  Administered 2022-04-06: 25 mg via ORAL
  Filled 2022-04-05 (×2): qty 1

## 2022-04-05 MED ORDER — FERROUS SULFATE 325 (65 FE) MG PO TABS
325.0000 mg | ORAL_TABLET | Freq: Every day | ORAL | Status: DC
  Administered 2022-04-06: 325 mg via ORAL
  Filled 2022-04-05: qty 1

## 2022-04-05 MED ORDER — LEVOTHYROXINE SODIUM 75 MCG PO TABS
75.0000 ug | ORAL_TABLET | Freq: Every day | ORAL | Status: DC
  Administered 2022-04-06: 75 ug via ORAL
  Filled 2022-04-05 (×2): qty 1

## 2022-04-05 MED ORDER — ALBUTEROL SULFATE HFA 108 (90 BASE) MCG/ACT IN AERS
2.0000 | INHALATION_SPRAY | RESPIRATORY_TRACT | Status: DC | PRN
Start: 2022-04-05 — End: 2022-04-06
  Administered 2022-04-06 (×2): 2 via RESPIRATORY_TRACT
  Filled 2022-04-05: qty 6.7

## 2022-04-05 MED ORDER — BUDESONIDE 3 MG PO CPEP
9.0000 mg | ORAL_CAPSULE | Freq: Every day | ORAL | Status: DC
  Administered 2022-04-06: 9 mg via ORAL
  Filled 2022-04-05: qty 3

## 2022-04-05 MED ORDER — DIPHENOXYLATE-ATROPINE 2.5-0.025 MG PO TABS: 1.0000 | ORAL_TABLET | Freq: Four times a day (QID) | ORAL | Status: DC | PRN

## 2022-04-05 MED ORDER — ACETAMINOPHEN 500 MG PO TABS: 1000.0000 mg | ORAL_TABLET | Freq: Four times a day (QID) | ORAL | Status: DC | PRN

## 2022-04-05 MED ORDER — OLANZAPINE 2.5 MG PO TABS
2.5000 mg | ORAL_TABLET | Freq: Every day | ORAL | Status: DC
  Administered 2022-04-06: 2.5 mg via ORAL
  Filled 2022-04-05: qty 1

## 2022-04-05 MED ORDER — CLONAZEPAM 0.5 MG PO TABS
0.5000 mg | ORAL_TABLET | Freq: Three times a day (TID) | ORAL | Status: DC
  Administered 2022-04-06: 0.5 mg via ORAL
  Filled 2022-04-05 (×2): qty 1

## 2022-04-05 MED ORDER — FAMOTIDINE 20 MG PO TABS
20.0000 mg | ORAL_TABLET | Freq: Every day | ORAL | Status: DC | PRN
  Administered 2022-04-06: 20 mg via ORAL
  Filled 2022-04-05: qty 1

## 2022-04-05 MED ORDER — DICYCLOMINE HCL 10 MG PO CAPS
20.0000 mg | ORAL_CAPSULE | Freq: Three times a day (TID) | ORAL | Status: DC
  Administered 2022-04-06 (×2): 20 mg via ORAL
  Filled 2022-04-05 (×2): qty 2

## 2022-04-05 MED ORDER — LEVOTHYROXINE SODIUM 75 MCG PO TABS: 75.0000 ug | ORAL_TABLET | Freq: Every day | ORAL | Status: DC

## 2022-04-05 NOTE — ED Provider Notes (Signed)
?[facility] ?Provider Note ? ? ?CSN: [id number] ?Arrival date & time: 04/05/22  1432 ? ?  ? ?History ? ?Chief Complaint  ?Patient presents with  ? Mental Health Problem  ? ? ?Claire [name] is a 71 y.o. female. ? ?Patient here with family who is concerned about her mental health.  Recently discharged from the hospital with urinary tract infection.  Patient per family has been endorsing more suicidal thoughts, increased aggressive behavior.  Patient now on Zyprexa once daily and now on mirtazapine once daily after being started on that by primary care doctor yesterday.  Still takes Klonopin as needed as well.  Family states that they are having a hard time taking care of her and want her possibly admitted to a [facility] psychiatric unit.  She was admitted back in [place] in March.  They are unable to take care of her due to her mental health needs.  Patient does not have history of dementia but family concerned maybe there is an element of that as well.  She has no complaints herself.  She states that her daughter is paranoid.  She denies any chest pain or shortness of breath.  She is not endorsing any suicidal ideation.  She denies any pain with urination, nausea or vomiting.  She has history of PTSD, anxiety ? ?The history is provided by the patient.  ?Altered Mental Status ?Presenting symptoms: behavior changes and combativeness   ?Severity:  Mild ?Episode history:  Multiple ?Timing:  Intermittent ?Progression:  Waxing and waning ?Chronicity:  Recurrent ?Associated symptoms: agitation and hallucinations   ?Associated symptoms: no abdominal pain, normal movement, no bladder incontinence, no decreased appetite, no depression, no difficulty breathing, no eye deviation, no fever, no headaches, no light-headedness, no rash, no seizures and no slurred speech   ? ?  ? ?Home Medications ?Prior to Admission medications   ?Medication Sig Start Date End Date Taking? Authorizing Provider   ?acetaminophen (TYLENOL) 500 MG tablet Take 1,000 mg by mouth every 6 (six) hours as needed for moderate pain.    [provider]  ?albuterol (VENTOLIN HFA) 108 (90 Base) MCG/ACT inhaler INHALE 1-2 PUFFS INTO THE LUNGS EVERY 2 HOURS AS NEEDED FOR WHEEZING OR SHORTNESS OF BREATH. ?Patient taking differently: Inhale 1-2 puffs into the lungs every 2 (two) hours as needed for wheezing or shortness of breath. 01/04/22   Donella Stade, PA-C  ?AMBULATORY NON FORMULARY MEDICATION Continuous stationary and portable oxygen tanks for use with ambulation. DX: COPD with hypoxia. 07/16/18   Donella Stade, PA-C  ?AMBULATORY NON FORMULARY MEDICATION Medication Name: full face mask to use with oxygen 08/25/21   Donella Stade, PA-C  ?AMBULATORY NON FORMULARY MEDICATION Full face mask for night time O2 ?DX: J96.11 01/20/22   Donella Stade, PA-C  ?AMBULATORY NON FORMULARY MEDICATION Medication Name: Bipap and supplies.  Inspiratory pressure: 10,  Expiratory pressure: 5 03/16/22   Hali Marry, MD  ?atenolol (TENORMIN) 25 MG tablet Take 1 tablet (25 mg total) by mouth 2 (two) times daily. 03/16/22   Donella Stade, PA-C  ?Budesonide ER 9 MG TB24 Take 9 mg by mouth daily. 02/04/22   [provider]  ?cholecalciferol (VITAMIN D) 25 MCG (1000 UT) tablet Take 1,000 Units by mouth daily.    [provider]  ?clonazePAM (KLONOPIN) 0.5 MG tablet Take 1 tablet (0.5 mg total) by mouth 3 (three) times daily. 03/14/22   Lavina Hamman, MD  ?dicyclomine (BENTYL) 10  MG capsule Take 20 mg by mouth 3 (three) times daily before meals.    [provider]  ?diphenoxylate-atropine (LOMOTIL) 2.5-0.025 MG tablet Take 1 tablet by mouth 4 (four) times daily as needed for diarrhea or loose stools. 03/16/22   Hali Marry, MD  ?Ruthe Mannan 100-5 MCG/ACT AERO TAKE 2 PUFFS BY MOUTH TWICE A DAY 04/04/22   Breeback, Jade L, PA-C  ?estradiol (ESTRACE) 0.1 MG/GM vaginal cream PLACE ONE APPLICATOR FULL THREE TIMES A  WEEK AND ONE PEA SIZED AMOUNT APPLIED TO URETHRAL OPENING ?Patient taking differently: Place 1 Applicatorful vaginally 3 (three) times a week. 11/12/21   Breeback, Jade L, PA-C  ?famotidine (PEPCID) 20 MG tablet TAKE 1 TABLET BY MOUTH TWICE A DAY ?Patient taking differently: Take 20 mg by mouth 2 (two) times daily as needed for heartburn. 07/01/19   Breeback, Royetta Car, PA-C  ?feeding supplement (ENSURE ENLIVE / ENSURE PLUS) LIQD Take 237 mLs by mouth 3 (three) times daily between meals. 03/14/22   Lavina Hamman, MD  ?ferrous sulfate 325 (65 FE) MG EC tablet 1 tab PO TID every other day ?Patient taking differently: Take 325 mg by mouth daily with breakfast. 1 tablet daily 07/13/17   Trixie Dredge, PA-C  ?ipratropium-albuterol (DUONEB) 0.5-2.5 (3) MG/3ML SOLN Take 3 mLs by nebulization every 4 (four) hours as needed (wheeze, SOB). 01/10/22 05/14/22  Pokhrel, Corrie Mckusick, MD  ?Lactobacillus (FLORAJEN ACIDOPHILUS PO) Take 1 capsule by mouth daily.    [provider]  ?levothyroxine (SYNTHROID) 75 MCG tablet Take 1 tablet (75 mcg total) by mouth daily. 11/03/21   Donella Stade, PA-C  ?Magnesium 250 MG TABS Take 250 mg by mouth daily.    [provider]  ?OLANZapine (ZYPREXA) 2.5 MG tablet Take 1 tablet (2.5 mg total) by mouth 2 (two) times daily. 04/01/22 05/01/22  Mariel Aloe, MD  ?saccharomyces boulardii (FLORASTOR) 250 MG capsule Take 1 capsule (250 mg total) by mouth 2 (two) times daily. ?Patient not taking: Reported on 03/29/2022 03/14/22   Lavina Hamman, MD  ?thiamine 100 MG tablet Take 1 tablet (100 mg total) by mouth daily. 03/16/22   Hali Marry, MD  ?Tiotropium Bromide Monohydrate (SPIRIVA RESPIMAT) 2.5 MCG/ACT AERS Inhale 2 puffs into the lungs daily.    [provider]  ?   ? ?Allergies    ?Bactrim [sulfamethoxazole-trimethoprim], Macrodantin [nitrofurantoin macrocrystal], Ivp dye [iodinated contrast media], Lexapro [escitalopram], Medrol [methylprednisolone], Motrin  [ibuprofen], Prednisone, Trimethoprim, and Zithromax [azithromycin]   ? ?Review of Systems   ?Review of Systems  ?Constitutional:  Negative for decreased appetite and fever.  ?Gastrointestinal:  Negative for abdominal pain.  ?Genitourinary:  Negative for bladder incontinence.  ?Skin:  Negative for rash.  ?Neurological:  Negative for seizures, light-headedness and headaches.  ?Psychiatric/Behavioral:  Positive for agitation and hallucinations.   ? ?Physical Exam ?Updated Vital Signs ?BP (!) 141/80   Pulse 98   Temp 99.1 ?F (37.3 ?C) (Oral)   Resp 18   Ht 5\' 3"  (1.6 m)   Wt 37 kg   SpO2 98%   BMI 14.45 kg/m?  ?Physical Exam ?Vitals and nursing note reviewed.  ?Constitutional:   ?   General: She is not in acute distress. ?   Appearance: She is well-developed. She is not ill-appearing.  ?HENT:  ?   Head: Normocephalic and atraumatic.  ?   Nose: Nose normal.  ?   Mouth/Throat:  ?   Mouth: Mucous membranes are moist.  ?Eyes:  ?  Extraocular Movements: Extraocular movements intact.  ?   Conjunctiva/sclera: Conjunctivae normal.  ?   Pupils: Pupils are equal, round, and reactive to light.  ?Cardiovascular:  ?   Rate and Rhythm: Normal rate and regular rhythm.  ?   Pulses: Normal pulses.  ?   Heart sounds: Normal heart sounds. No murmur heard. ?Pulmonary:  ?   Effort: Pulmonary effort is normal. No respiratory distress.  ?   Breath sounds: Normal breath sounds.  ?Abdominal:  ?   Palpations: Abdomen is soft.  ?   Tenderness: There is no abdominal tenderness.  ?Musculoskeletal:     ?   General: No swelling.  ?   Cervical back: Normal range of motion and neck supple.  ?Skin: ?   General: Skin is warm and dry.  ?   Capillary Refill: Capillary refill takes less than 2 seconds.  ?Neurological:  ?   General: No focal deficit present.  ?   Mental Status: She is alert.  ?   Cranial Nerves: No cranial nerve deficit.  ?   Sensory: No sensory deficit.  ?   Motor: No weakness.  ?   Coordination: Coordination normal.  ?Psychiatric:      ?   Mood and Affect: Mood normal.     ?   Behavior: Behavior normal.     ?   Thought Content: Thought content normal.     ?   Judgment: Judgment normal.  ?   Comments: Patient overall is calm currently, d

## 2022-04-05 NOTE — ED Provider Triage Note (Signed)
Emergency Medicine Provider Triage Evaluation Note ? ?Claire [name] , a 71 y.o. female  was evaluated in triage.  Pt complains of mild increase in falls.  Accompanied by daughter/family member.  Daughter explains the patient has been having worsening insomnia, fighting with the family, verbally abusive the family, also making statements of suicidal ideation.  Occasional episodes of drooling on the right side of her mouth.  No facial asymmetry, ataxia, discoordination or disequilibrium otherwise.  On 2 L O2 at home with Hx of COPD.  Recently treated in behavioral health facility, which was a inpatient admission.  Patient's daughter says that the patient is not doing as well with her behaviors without the Haldol.  Denies fever or active SI/HI. ? ?Review of Systems  ?Positive: As above ?Negative: As above ? ?Physical Exam  ?BP 112/61 (BP Location: Left Arm)   Pulse 99   Temp 99.1 ?F (37.3 ?C) (Oral)   Resp 18   Ht 5\' 3"  (1.6 m)   Wt 37 kg   SpO2 100%   BMI 14.45 kg/m?  ?Gen:   Awake, no distress   ?Resp:  Normal effort CTAB ?MSK:   Moves extremities without difficulty  ?Other:  RRR w/o M/R/G.  No drooling, expressive aphasia, facial asymmetry observed.  Sensation and coordination appear grossly intact.  2+ radial pulses bilat. neuro exam overall unremarkable. ? ?Medical Decision Making  ?Medically screening exam initiated at 4:15 PM.  Appropriate orders placed.  EMINA RIBAUDO was informed that the remainder of the evaluation will be completed by another provider, this initial triage assessment does not replace that evaluation, and the importance of remaining in the ED until their evaluation is complete. ? ?Labs ordered ?  ?Claire Best, PA-C ?04/05/22 1628 ? ?

## 2022-04-05 NOTE — ED Notes (Signed)
Pt not wanting to keep any clothes on, this RN provided a few blankets to keep pt covered.  ?

## 2022-04-05 NOTE — ED Triage Notes (Signed)
Pt arrives POV accompanied by daughter with complaints of altered mental status, per daughter patient has been crawling out of hospital bed, displaying worsening insomnia, fighting with family, verbally abusive to family, also making threats to kill herself. Pt has also been drooling much more than usual. Pt in on 2 L of O2 at baseline w/ COPD. Denies any other complaints.  ?

## 2022-04-06 ENCOUNTER — Encounter (HOSPITAL_COMMUNITY): Payer: Self-pay | Admitting: Registered Nurse

## 2022-04-06 ENCOUNTER — Ambulatory Visit: Payer: Medicare Other | Admitting: Physician Assistant

## 2022-04-06 DIAGNOSIS — F4310 Post-traumatic stress disorder, unspecified: Secondary | ICD-10-CM

## 2022-04-06 DIAGNOSIS — R4689 Other symptoms and signs involving appearance and behavior: Secondary | ICD-10-CM | POA: Diagnosis present

## 2022-04-06 DIAGNOSIS — R45851 Suicidal ideations: Secondary | ICD-10-CM | POA: Insufficient documentation

## 2022-04-06 MED ORDER — ENSURE ENLIVE PO LIQD
237.0000 mL | Freq: Two times a day (BID) | ORAL | Status: DC
Start: 2022-04-06 — End: 2022-04-06
  Filled 2022-04-06: qty 237

## 2022-04-06 NOTE — Consult Note (Signed)
Telepsych Consultation  ? ?Reason for Consult:  Aggressive behavior, altered mental status ?Referring Physician:  Lennice Sites, DO ?Location of Patient: [facility] ?Location of Provider: Other: [facility] ? ?Patient Identification: Claire [name] ?MRN:  [id number] ?Principal Diagnosis: Aggressive behavior ?Diagnosis:  Principal Problem: ?  Aggressive behavior ?Active Problems: ?  PTSD (post-traumatic stress disorder) ? ? ?Total Time spent with patient: 45 minutes ? ?Subjective:   ?Claire [name] is a 71 y.o. female patient admitted to [facility] ED after presenting with her daughter and complaints of altered metal status and aggressive behavior since she was discharged from hospital "couple days ago." ? ?HPI:  Claire [name], 71 y.o., female patient seen via tele health by this provider, consulted with Dr. Hampton Abbot; and chart reviewed on 04/06/22.  On evaluation Claire [name] reports she is not sure why she was brought back to the hospital at first and then she states "I couldn't figure it out at first and I've thought about it all night and I think I know why now. They clam I was trying to kill myself and I couldn't understand why they would think that all I could come up with is that I had took 2 of my Klonopin."  Patient suicidal/self-harm/homicidal ideation, psychosis, paranoia.  Patient denies prior suicide attempt.  Patient reports she has outpatient psychiatric services with Dr. De Nurse who she sees once a month.  Patient reports she takes her medications as prescribed.  Patient reports recent medical hospitalization related to urinary tract infection and goes on to discuss treatment options given to her by her urologist/PCP to prevent future infections.  Patient reports she has been eating and sleeping without any difficulty.  Patient does report "About a week ago I was not eating all that well and I lost about 19 pounds.  Now I am eating fine and eating more food, I have started gaining my weight back."  Patient reports  she lives with her daughter, son-in-law, and granddaughter.  Patient gave permission to speak to her daughter Elder Love at [phone] for collateral information. ?During evaluation Honestie Kulik Goupil is sitting up in bed in no acute distress.  She is alert/oriented x 4; calm/cooperative.  She was able to give the correct answers for her age, DOB, current place, city, state, country, and the last 3 presidents.  Her mood congruent with affect.  She is speaking in a clear tone at moderate volume, and normal pace; with good eye contact.  Her thought process is coherent and relevant; There is no indication that she is currently responding to internal/external stimuli or experiencing delusional thought content; and she has denied suicidal/self-harm/homicidal ideation, psychosis, and paranoia.   ?Patient has remained calm throughout assessment and has answered questions appropriately.   ? ?Collateral information: Elder Love at [phone] Ms. Jimmye Norman reports that her mother has been living with her for the last 3 months.  Reports there has been an increase in her mother's aggression since she was recently discharged from the hospital a couple of days ago.  "She is hitting me, she spit something in her hands and threw it at my brother, she is telling people that we have her locked in, she is crawling out of bed at foot are trying to get over rails at night."  Ms. Jimmye Norman was asked if the rails were up on patient's bed at nighttime or if it was a possibility that patient could be trying to go to the bathroom.  Ms. Jimmye Norman states "I  pulled the rails up on the hospital bed at night to keep her from wandering the house.  She is up walking around at night.  She is asking for her keys and her credit cards; she saying she wants to leave.  I have tried everything to keep her from moving around the house at night.  I even reports a baby gate and put pills all over it so I could hear if she tried to move it and come out of  her room but she would just screen until I moved it."  Ms. Jimmye Norman states that she cannot bring her mother back home with her "I am met with CM.  I have tried everything and asked everyone for help.  I was told that if her border to the emergency room that you guys would be able to put her in a general psychiatric hospital and then may be into a nursing facility."  Reports that she has tried to get guardianship of patient but it has had to be scheduled twice related to her mother being in the hospital.  Reports that she is unable to care for patient related to the aggressive behavior, mother telling people that they have her locked in the house, and " her behavior is a lot different when she is at home.  I know her medications are not right in her psychiatrist wanting to speak to me because she wants to sign the papers for him to give me information.  She tolerances every day that she is going to kill [place] and kill herself, she is told a lot of people that she is trapped in this house.  And I just cannot take it anymore."  Ms. Jimmye Norman states that she is not going to come pick her mother up.  Ms. Jimmye Norman was informed that she refuses to pick up her mother that a report to adult protective service but need to be done.  Informed that this provider was unsure process after the report is made.  Also informed would have social work to give her a call to discuss the process of having someone admitted to a family care home, assisted living, a nursing facility. ?Past Psychiatric History: See below ? ?Risk to Self: Denies ?Risk to Others: Denies ?Prior Inpatient Therapy: Denies ?Prior Outpatient Therapy: Currently has outpatient psychiatric services with Dr. De Nurse for medication management and sees monthly ? ?Past Medical History:  ?Past Medical History:  ?Diagnosis Date  ? Acid reflux   ? Anxiety   ? Encephalopathy acute   ? High blood pressure   ? High serum parathyroid hormone (PTH) 07/19/2017  ? 96 06/28/2014, 162 12/29/16   ? Hypothyroid   ? PTSD (post-traumatic stress disorder)   ? History reviewed. No pertinent surgical history. ?Family History:  ?Family History  ?Problem Relation Age of Onset  ? Anxiety disorder Mother   ? OCD Daughter   ? Alcohol abuse Father   ? Anxiety disorder Sister   ? ?Family Psychiatric  History: None reported ?Social History:  ?Social History  ? ?Substance and Sexual Activity  ?Alcohol Use No  ?   ?Social History  ? ?Substance and Sexual Activity  ?Drug Use No  ?  ?Social History  ? ?Socioeconomic History  ? Marital status: Divorced  ?  Spouse name: Not on file  ? Number of children: 2  ? Years of education: 61  ? Highest education level: Associate degree: academic program  ?Occupational History  ? 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  Comment: retired  ?Tobacco Use  ? Smoking status: Former  ?  Packs/day: 1.00  ?  Years: 55.00  ?  Pack years: 55.00  ?  Types: Cigarettes  ? Smokeless tobacco: Never  ? Tobacco comments:  ?  Stopped smoking about 40 days ago  ?Vaping Use  ? Vaping Use: Never used  ?Substance and Sexual Activity  ? Alcohol use: No  ? Drug use: No  ? Sexual activity: Not Currently  ?Other Topics Concern  ? Not on file  ?Social History Narrative  ? Lives alone. She enjoys sewing and painting.  ? ?Social Determinants of Health  ? ?Financial Resource Strain: Low Risk   ? Difficulty of Paying Living Expenses: Not hard at all  ?Food Insecurity: No Food Insecurity  ? Worried About Charity fundraiser in the Last Year: Never true  ? Ran Out of Food in the Last Year: Never true  ?Transportation Needs: No Transportation Needs  ? Lack of Transportation (Medical): No  ? Lack of Transportation (Non-Medical): No  ?Physical Activity: Inactive  ? Days of Exercise per Week: 0 days  ? Minutes of Exercise per Session: 0 min  ?Stress: No Stress Concern Present  ? Feeling of Stress : Not at all  ?Social Connections: Socially Isolated  ? Frequency of Communication with Friends and Family: More than three times  a week  ? Frequency of Social Gatherings with Friends and Family: Never  ? Attends Religious Services: Never  ? Active Member of Clubs or Organizations: No  ? Attends Archivist Meetings: [employer]

## 2022-04-06 NOTE — ED Notes (Signed)
Pt changed into purple scrubs 

## 2022-04-06 NOTE — BH Assessment (Signed)
[facility] Assessment Progress Note ?  [name], NP, this pt does not require psychiatric hospitalization at this time.  Pt is psychiatrically cleared.  Discharge instructions advise pt to continue treatment with Thresa Ross, MD at the [facility] [place].  Her next appointment is scheduled for Tuesday, May 03, 2022 at 15:00, and this has been included in instructions.  Pt's nurse, Sheria Lang, has been notified. ? ?Doylene Canning, MA ?Triage Specialist ?[phone]  ?

## 2022-04-06 NOTE — Discharge Instructions (Addendum)
Continue your current meds.  ? ?See your doctor. You were seen by our behavioral health team and felt that you shouldn't been in a psychiatric facility  ? ?Return to ER if you have thoughts of harming yourself or others  ? ?For your behavioral health needs you are advised to continue treatment with Merian Capron, MD.  Your next appointment is scheduled for Tuesday, May 03, 2022 at 15:00: ? ?     [facility] [place] ?     [address]     [place], [place] [address] ?     [phone]  ?

## 2022-04-06 NOTE — Progress Notes (Signed)
CSW  received call from son, Claire [name] @ [phone] asking for guidance for placement needs for Pt. CSW explained that memory care placement requires funding and suggested that family apply for Medicaid. CSW further explained that memory care placement cannot happen without memory loss/dementia diagnosis and explained process of how to seek  such diagnosis starting with PCP. CSW then met with Pt and daughter, Claire [name] at bedside as Pt was d/c'ing. CSW attempted to offer printed resources to daughter for [facility] and lists of memory care and assisted living options in [place] and [place], but daughter states that she is very angry with case management and feels that we are not assisting her enough and that she does not want any resources nor to speak with this CSW. ?

## 2022-04-06 NOTE — ED Notes (Signed)
Pt's belongings in visitor locker #4 in purple zone. ?

## 2022-04-06 NOTE — ED Notes (Signed)
Pt daughter arrives to pick her up. Paperwork rescinded and discharge paperwork given to daughter. Pt dressed in clothes and given back from the locker.  ?During the staff getting the pt dressed and ready to go, daughter states "you cant get any help around here". ?Child psychotherapist at bedside to provide resources.  ?Daughter refuses any other help or information. ?Daughter walks out saying "it is well documented that I have asked for help and when something happens, just know yall will have to answer some questions." ?Daughter seen pushing pt out in wheelchair. ?

## 2022-04-06 NOTE — ED Notes (Signed)
Breakfast order placed ?

## 2022-04-06 NOTE — ED Notes (Signed)
Pt making phone call to her daughter ?

## 2022-04-06 NOTE — ED Provider Notes (Signed)
?  Physical Exam  ?BP (!) 147/76   Pulse 94   Temp 97.6 ?F (36.4 ?C) (Oral)   Resp 20   Ht 5\' 3"  (1.6 m)   Wt 37 kg   SpO2 98%   BMI 14.45 kg/m?  ? ?Physical Exam ? ?Procedures  ?Procedures ? ?ED Course / MDM  ?  ?Medical Decision Making ?Patient was seen by psychiatry and cleared by psych. I rescinded IVC. Will have her follow up with behavioral health. Medically cleared by previous provider  ? ?Amount and/or Complexity of Data Reviewed ?Labs: ordered. ? ?Risk ?OTC drugs. ?Prescription drug management. ? ? ? ? ? ? ? ?  ? , MD ?04/06/22 1513 ? ?

## 2022-04-06 NOTE — Progress Notes (Addendum)
CSW contacted patients daughter Claire [name], [phone] to see if she was picking patient up or if she needed a ride home. Patients daughter stated she is not picking her mother up and she already told psychiatry that she wasn't. CSW explained that her mother cannot stay in the ED and she has been medical and psych cleared. Patients daughter stated psychiatry told her CSW was going to help with placement. CSW explained to the daughter that the ED does not do long term placement and her mother could not board in the ED for months. Patients daughter stated her mother needs memory care placement and she has asked the hospital for help several times and no one will help her. CSW asked if her mother has medicaid or a dementia diagnosis from a doctor. Patients daughter said no to both. CSW explained in order to go to Memory care she will need a dementia diagnosis and medicaid unless she can pay out of pocket. CSW explained that could range from 5,000 and up a month. Patients daughter stated she did not have that type of money. CSW told the daughter she would need to apply for medicaid first. CSW also told patients daughter she can go to the primary care provider to see if she can get a dementia diagnosis or a referral to a neurologist. Patients daughter continued to say no one will help me. CSW stated that she is trying to help her and give her the resources she needs to get her mother placement. Patients daughter continued to say she cannot care for her mother. CSW explained the solution is not to leave her mother in the ED. CSW told patients daughter that she will be making a report to APS if she does not come and get her mother. Patients daughter stated she is not the only child of patient and to call her brother Claire [name], [phone].  ? ?CSW spoke wit patients son and told him the same information as his sister and he explained that he is not in good health and is also not on good terms with his sister.  Patients son stated he cannot care for his mother. CSW explained again they could not leave their mother in the ED. CSW explained to the son what was need for placement which includes medicaid and a dementia diagnosis if they choose to go the memory care route. Patients son stated his mother was in a facility but his sister took patient out of it and brought her home. Patients son stated if his sister would/ve kept her in the facility then they would not be having this issue now. CSW explained that she will give them time to come uo with a solution but if no one comes to get patient then CSW will send patient back to [address]  ?[place] [address]. Patients son confirmed that is tshe address his sister Claire [name] and mother reside. Patients son stated he will call his sister and call CSW back.  ? ? ? ?

## 2022-04-06 NOTE — ED Notes (Signed)
Pt speaking to TTS now. 

## 2022-04-07 ENCOUNTER — Other Ambulatory Visit: Payer: Self-pay | Admitting: Physician Assistant

## 2022-04-07 ENCOUNTER — Telehealth: Payer: Self-pay | Admitting: *Deleted

## 2022-04-07 DIAGNOSIS — K580 Irritable bowel syndrome with diarrhea: Secondary | ICD-10-CM

## 2022-04-07 NOTE — Telephone Encounter (Signed)
Listed as from a "historical provider" ?Okay to fill?  ?

## 2022-04-07 NOTE — Telephone Encounter (Signed)
Pt daughter called back regarding mother's care in ED. ?

## 2022-04-08 ENCOUNTER — Telehealth: Payer: Self-pay | Admitting: Neurology

## 2022-04-08 NOTE — Telephone Encounter (Signed)
Patient's daughter left vm that they need FL2 for placement for patient, will work on paperwork.  ?

## 2022-04-09 DIAGNOSIS — R2689 Other abnormalities of gait and mobility: Secondary | ICD-10-CM | POA: Diagnosis not present

## 2022-04-09 DIAGNOSIS — E039 Hypothyroidism, unspecified: Secondary | ICD-10-CM | POA: Diagnosis not present

## 2022-04-09 DIAGNOSIS — J9622 Acute and chronic respiratory failure with hypercapnia: Secondary | ICD-10-CM | POA: Diagnosis not present

## 2022-04-09 DIAGNOSIS — A419 Sepsis, unspecified organism: Secondary | ICD-10-CM | POA: Diagnosis not present

## 2022-04-09 DIAGNOSIS — F0394 Unspecified dementia, unspecified severity, with anxiety: Secondary | ICD-10-CM | POA: Diagnosis not present

## 2022-04-09 DIAGNOSIS — J432 Centrilobular emphysema: Secondary | ICD-10-CM | POA: Diagnosis not present

## 2022-04-09 DIAGNOSIS — R918 Other nonspecific abnormal finding of lung field: Secondary | ICD-10-CM | POA: Diagnosis not present

## 2022-04-09 DIAGNOSIS — I10 Essential (primary) hypertension: Secondary | ICD-10-CM | POA: Diagnosis not present

## 2022-04-09 DIAGNOSIS — E871 Hypo-osmolality and hyponatremia: Secondary | ICD-10-CM | POA: Diagnosis not present

## 2022-04-09 DIAGNOSIS — F0393 Unspecified dementia, unspecified severity, with mood disturbance: Secondary | ICD-10-CM | POA: Diagnosis not present

## 2022-04-09 DIAGNOSIS — R059 Cough, unspecified: Secondary | ICD-10-CM | POA: Diagnosis not present

## 2022-04-09 DIAGNOSIS — R1310 Dysphagia, unspecified: Secondary | ICD-10-CM | POA: Diagnosis not present

## 2022-04-09 DIAGNOSIS — G9341 Metabolic encephalopathy: Secondary | ICD-10-CM | POA: Diagnosis not present

## 2022-04-09 DIAGNOSIS — R300 Dysuria: Secondary | ICD-10-CM | POA: Diagnosis not present

## 2022-04-09 DIAGNOSIS — I083 Combined rheumatic disorders of mitral, aortic and tricuspid valves: Secondary | ICD-10-CM | POA: Diagnosis not present

## 2022-04-09 DIAGNOSIS — N179 Acute kidney failure, unspecified: Secondary | ICD-10-CM | POA: Diagnosis not present

## 2022-04-09 DIAGNOSIS — E43 Unspecified severe protein-calorie malnutrition: Secondary | ICD-10-CM | POA: Diagnosis not present

## 2022-04-09 DIAGNOSIS — Z20822 Contact with and (suspected) exposure to covid-19: Secondary | ICD-10-CM | POA: Diagnosis not present

## 2022-04-09 DIAGNOSIS — J984 Other disorders of lung: Secondary | ICD-10-CM | POA: Diagnosis not present

## 2022-04-09 DIAGNOSIS — R1011 Right upper quadrant pain: Secondary | ICD-10-CM | POA: Diagnosis not present

## 2022-04-09 DIAGNOSIS — Z720 Tobacco use: Secondary | ICD-10-CM | POA: Diagnosis not present

## 2022-04-09 DIAGNOSIS — Z9981 Dependence on supplemental oxygen: Secondary | ICD-10-CM | POA: Diagnosis not present

## 2022-04-09 DIAGNOSIS — A310 Pulmonary mycobacterial infection: Secondary | ICD-10-CM | POA: Diagnosis not present

## 2022-04-09 DIAGNOSIS — R262 Difficulty in walking, not elsewhere classified: Secondary | ICD-10-CM | POA: Diagnosis not present

## 2022-04-09 DIAGNOSIS — I7121 Aneurysm of the ascending aorta, without rupture: Secondary | ICD-10-CM | POA: Diagnosis not present

## 2022-04-09 DIAGNOSIS — F03911 Unspecified dementia, unspecified severity, with agitation: Secondary | ICD-10-CM | POA: Diagnosis not present

## 2022-04-09 DIAGNOSIS — E440 Moderate protein-calorie malnutrition: Secondary | ICD-10-CM | POA: Diagnosis not present

## 2022-04-09 DIAGNOSIS — D638 Anemia in other chronic diseases classified elsewhere: Secondary | ICD-10-CM | POA: Diagnosis not present

## 2022-04-09 DIAGNOSIS — A4150 Gram-negative sepsis, unspecified: Secondary | ICD-10-CM | POA: Diagnosis not present

## 2022-04-09 DIAGNOSIS — J9621 Acute and chronic respiratory failure with hypoxia: Secondary | ICD-10-CM | POA: Diagnosis not present

## 2022-04-09 DIAGNOSIS — M6281 Muscle weakness (generalized): Secondary | ICD-10-CM | POA: Diagnosis not present

## 2022-04-09 DIAGNOSIS — J439 Emphysema, unspecified: Secondary | ICD-10-CM | POA: Diagnosis not present

## 2022-04-09 DIAGNOSIS — J40 Bronchitis, not specified as acute or chronic: Secondary | ICD-10-CM | POA: Diagnosis not present

## 2022-04-09 DIAGNOSIS — R64 Cachexia: Secondary | ICD-10-CM | POA: Diagnosis not present

## 2022-04-09 DIAGNOSIS — R0902 Hypoxemia: Secondary | ICD-10-CM | POA: Diagnosis not present

## 2022-04-09 DIAGNOSIS — Z743 Need for continuous supervision: Secondary | ICD-10-CM | POA: Diagnosis not present

## 2022-04-09 DIAGNOSIS — J449 Chronic obstructive pulmonary disease, unspecified: Secondary | ICD-10-CM | POA: Diagnosis not present

## 2022-04-09 DIAGNOSIS — Z681 Body mass index (BMI) 19 or less, adult: Secondary | ICD-10-CM | POA: Diagnosis not present

## 2022-04-09 DIAGNOSIS — Z79899 Other long term (current) drug therapy: Secondary | ICD-10-CM | POA: Diagnosis not present

## 2022-04-09 DIAGNOSIS — I700 Atherosclerosis of aorta: Secondary | ICD-10-CM | POA: Diagnosis not present

## 2022-04-09 DIAGNOSIS — K219 Gastro-esophageal reflux disease without esophagitis: Secondary | ICD-10-CM | POA: Diagnosis not present

## 2022-04-10 DIAGNOSIS — E43 Unspecified severe protein-calorie malnutrition: Secondary | ICD-10-CM | POA: Diagnosis not present

## 2022-04-10 DIAGNOSIS — Z720 Tobacco use: Secondary | ICD-10-CM | POA: Diagnosis not present

## 2022-04-10 DIAGNOSIS — J449 Chronic obstructive pulmonary disease, unspecified: Secondary | ICD-10-CM | POA: Diagnosis not present

## 2022-04-11 DIAGNOSIS — Z20822 Contact with and (suspected) exposure to covid-19: Secondary | ICD-10-CM | POA: Diagnosis not present

## 2022-04-11 DIAGNOSIS — I7121 Aneurysm of the ascending aorta, without rupture: Secondary | ICD-10-CM | POA: Diagnosis not present

## 2022-04-11 DIAGNOSIS — I700 Atherosclerosis of aorta: Secondary | ICD-10-CM | POA: Diagnosis not present

## 2022-04-11 DIAGNOSIS — J984 Other disorders of lung: Secondary | ICD-10-CM | POA: Diagnosis not present

## 2022-04-11 DIAGNOSIS — R918 Other nonspecific abnormal finding of lung field: Secondary | ICD-10-CM | POA: Diagnosis not present

## 2022-04-11 DIAGNOSIS — J439 Emphysema, unspecified: Secondary | ICD-10-CM | POA: Diagnosis not present

## 2022-04-12 DIAGNOSIS — J984 Other disorders of lung: Secondary | ICD-10-CM | POA: Diagnosis not present

## 2022-04-12 DIAGNOSIS — R918 Other nonspecific abnormal finding of lung field: Secondary | ICD-10-CM | POA: Diagnosis not present

## 2022-04-12 DIAGNOSIS — Z20822 Contact with and (suspected) exposure to covid-19: Secondary | ICD-10-CM | POA: Diagnosis not present

## 2022-04-12 DIAGNOSIS — I700 Atherosclerosis of aorta: Secondary | ICD-10-CM | POA: Diagnosis not present

## 2022-04-12 DIAGNOSIS — I7121 Aneurysm of the ascending aorta, without rupture: Secondary | ICD-10-CM | POA: Diagnosis not present

## 2022-04-12 DIAGNOSIS — J439 Emphysema, unspecified: Secondary | ICD-10-CM | POA: Diagnosis not present

## 2022-04-13 ENCOUNTER — Ambulatory Visit: Payer: Medicare Other | Admitting: Physician Assistant

## 2022-04-13 NOTE — Telephone Encounter (Signed)
Paperwork started and given to [facility] for final completion.  ?

## 2022-04-14 NOTE — Telephone Encounter (Signed)
FL2 forms completed. Patient has been readmitted to hospital with Pneumonia. I did encourage patient's daughter to discuss with social work at hospital and since she has been admitted over 3 nights they should be able to place her in care when discharged. Will hold forms for now. [name] will let us know if she needs [place] to send these anywhere.  ?

## 2022-04-19 ENCOUNTER — Other Ambulatory Visit: Payer: Self-pay | Admitting: Physician Assistant

## 2022-04-19 DIAGNOSIS — J432 Centrilobular emphysema: Secondary | ICD-10-CM

## 2022-04-19 DIAGNOSIS — J441 Chronic obstructive pulmonary disease with (acute) exacerbation: Secondary | ICD-10-CM

## 2022-04-22 DIAGNOSIS — R0902 Hypoxemia: Secondary | ICD-10-CM | POA: Diagnosis not present

## 2022-05-03 ENCOUNTER — Ambulatory Visit (HOSPITAL_COMMUNITY): Payer: Medicare Other | Admitting: Psychiatry

## 2022-05-03 DIAGNOSIS — J449 Chronic obstructive pulmonary disease, unspecified: Secondary | ICD-10-CM | POA: Diagnosis not present

## 2022-05-09 ENCOUNTER — Telehealth: Payer: Self-pay | Admitting: Pulmonary Disease

## 2022-05-10 NOTE — Telephone Encounter (Signed)
The [facility] and the daughter wants to know if a new CT scan is necessary. Please advise.

## 2022-05-16 DIAGNOSIS — Z743 Need for continuous supervision: Secondary | ICD-10-CM | POA: Diagnosis not present

## 2022-05-16 DIAGNOSIS — M6281 Muscle weakness (generalized): Secondary | ICD-10-CM | POA: Diagnosis not present

## 2022-05-16 DIAGNOSIS — G9341 Metabolic encephalopathy: Secondary | ICD-10-CM | POA: Diagnosis not present

## 2022-05-16 DIAGNOSIS — R0902 Hypoxemia: Secondary | ICD-10-CM | POA: Diagnosis not present

## 2022-05-16 DIAGNOSIS — J984 Other disorders of lung: Secondary | ICD-10-CM | POA: Diagnosis not present

## 2022-05-16 DIAGNOSIS — R2689 Other abnormalities of gait and mobility: Secondary | ICD-10-CM | POA: Diagnosis not present

## 2022-05-16 DIAGNOSIS — E559 Vitamin D deficiency, unspecified: Secondary | ICD-10-CM | POA: Diagnosis not present

## 2022-05-16 DIAGNOSIS — R262 Difficulty in walking, not elsewhere classified: Secondary | ICD-10-CM | POA: Diagnosis not present

## 2022-05-16 DIAGNOSIS — E039 Hypothyroidism, unspecified: Secondary | ICD-10-CM | POA: Diagnosis not present

## 2022-05-16 DIAGNOSIS — A419 Sepsis, unspecified organism: Secondary | ICD-10-CM | POA: Diagnosis not present

## 2022-05-16 DIAGNOSIS — R300 Dysuria: Secondary | ICD-10-CM | POA: Diagnosis not present

## 2022-05-16 DIAGNOSIS — R1310 Dysphagia, unspecified: Secondary | ICD-10-CM | POA: Diagnosis not present

## 2022-05-16 DIAGNOSIS — J449 Chronic obstructive pulmonary disease, unspecified: Secondary | ICD-10-CM | POA: Diagnosis not present

## 2022-05-16 DIAGNOSIS — J9621 Acute and chronic respiratory failure with hypoxia: Secondary | ICD-10-CM | POA: Diagnosis not present

## 2022-05-16 DIAGNOSIS — J9622 Acute and chronic respiratory failure with hypercapnia: Secondary | ICD-10-CM | POA: Diagnosis not present

## 2022-05-16 DIAGNOSIS — I10 Essential (primary) hypertension: Secondary | ICD-10-CM | POA: Diagnosis not present

## 2022-05-16 DIAGNOSIS — Z720 Tobacco use: Secondary | ICD-10-CM | POA: Diagnosis not present

## 2022-05-16 DIAGNOSIS — J9611 Chronic respiratory failure with hypoxia: Secondary | ICD-10-CM | POA: Diagnosis not present

## 2022-05-16 DIAGNOSIS — N390 Urinary tract infection, site not specified: Secondary | ICD-10-CM | POA: Diagnosis not present

## 2022-05-17 DIAGNOSIS — J9611 Chronic respiratory failure with hypoxia: Secondary | ICD-10-CM | POA: Diagnosis not present

## 2022-05-17 DIAGNOSIS — I10 Essential (primary) hypertension: Secondary | ICD-10-CM | POA: Diagnosis not present

## 2022-05-17 DIAGNOSIS — E039 Hypothyroidism, unspecified: Secondary | ICD-10-CM | POA: Diagnosis not present

## 2022-05-17 DIAGNOSIS — Z720 Tobacco use: Secondary | ICD-10-CM | POA: Diagnosis not present

## 2022-05-17 DIAGNOSIS — J449 Chronic obstructive pulmonary disease, unspecified: Secondary | ICD-10-CM | POA: Diagnosis not present

## 2022-05-17 DIAGNOSIS — J984 Other disorders of lung: Secondary | ICD-10-CM | POA: Diagnosis not present

## 2022-05-17 NOTE — Telephone Encounter (Signed)
Please contact daughter with the following message:  CT scan would be helpful in determining if her cavitary lesion has worsened or enlarged.  However I understand that your mother's condition has worsened and that this may not be a priority right now. I would only pursue CT if there is plan for treatment in the near future or if knowing that her lung condition is worsening would help in making decisions regarding goals of care/end-of-life care.  If patient has additional questions or would like to discuss further, please schedule telephone visit. Video not needed.

## 2022-05-18 DIAGNOSIS — E559 Vitamin D deficiency, unspecified: Secondary | ICD-10-CM | POA: Diagnosis not present

## 2022-05-18 DIAGNOSIS — R300 Dysuria: Secondary | ICD-10-CM | POA: Diagnosis not present

## 2022-05-18 NOTE — Telephone Encounter (Signed)
Called daughter but she did not answer. Left message for her to call us back.

## 2022-05-19 DIAGNOSIS — N390 Urinary tract infection, site not specified: Secondary | ICD-10-CM | POA: Diagnosis not present

## 2022-05-23 DIAGNOSIS — R0902 Hypoxemia: Secondary | ICD-10-CM | POA: Diagnosis not present

## 2022-05-24 DIAGNOSIS — I10 Essential (primary) hypertension: Secondary | ICD-10-CM | POA: Diagnosis not present

## 2022-05-24 DIAGNOSIS — J449 Chronic obstructive pulmonary disease, unspecified: Secondary | ICD-10-CM | POA: Diagnosis not present

## 2022-05-24 DIAGNOSIS — E039 Hypothyroidism, unspecified: Secondary | ICD-10-CM | POA: Diagnosis not present

## 2022-05-26 ENCOUNTER — Other Ambulatory Visit: Payer: Medicare Other

## 2022-05-30 DIAGNOSIS — M6281 Muscle weakness (generalized): Secondary | ICD-10-CM | POA: Diagnosis not present

## 2022-05-30 DIAGNOSIS — R1310 Dysphagia, unspecified: Secondary | ICD-10-CM | POA: Diagnosis not present

## 2022-05-30 DIAGNOSIS — J9621 Acute and chronic respiratory failure with hypoxia: Secondary | ICD-10-CM | POA: Diagnosis not present

## 2022-05-30 DIAGNOSIS — R2689 Other abnormalities of gait and mobility: Secondary | ICD-10-CM | POA: Diagnosis not present

## 2022-05-30 DIAGNOSIS — I10 Essential (primary) hypertension: Secondary | ICD-10-CM | POA: Diagnosis not present

## 2022-05-30 DIAGNOSIS — H02409 Unspecified ptosis of unspecified eyelid: Secondary | ICD-10-CM | POA: Diagnosis not present

## 2022-05-30 DIAGNOSIS — R262 Difficulty in walking, not elsewhere classified: Secondary | ICD-10-CM | POA: Diagnosis not present

## 2022-05-31 DIAGNOSIS — E43 Unspecified severe protein-calorie malnutrition: Secondary | ICD-10-CM | POA: Diagnosis not present

## 2022-05-31 DIAGNOSIS — J449 Chronic obstructive pulmonary disease, unspecified: Secondary | ICD-10-CM | POA: Diagnosis not present

## 2022-05-31 DIAGNOSIS — I10 Essential (primary) hypertension: Secondary | ICD-10-CM | POA: Diagnosis not present

## 2022-05-31 DIAGNOSIS — J9611 Chronic respiratory failure with hypoxia: Secondary | ICD-10-CM | POA: Diagnosis not present

## 2022-06-01 DIAGNOSIS — J449 Chronic obstructive pulmonary disease, unspecified: Secondary | ICD-10-CM | POA: Diagnosis not present

## 2022-06-01 DIAGNOSIS — E43 Unspecified severe protein-calorie malnutrition: Secondary | ICD-10-CM | POA: Diagnosis not present

## 2022-06-01 DIAGNOSIS — R1011 Right upper quadrant pain: Secondary | ICD-10-CM | POA: Diagnosis not present

## 2022-06-01 DIAGNOSIS — Z720 Tobacco use: Secondary | ICD-10-CM | POA: Diagnosis not present

## 2022-06-01 NOTE — Telephone Encounter (Signed)
Pt was admitted to the hospital from 5/6-6/12. During pt's hospital course, pt had a CT angio performed as well as multiple cxrs.

## 2022-06-02 DIAGNOSIS — S72002A Fracture of unspecified part of neck of left femur, initial encounter for closed fracture: Secondary | ICD-10-CM | POA: Diagnosis not present

## 2022-06-02 DIAGNOSIS — M25562 Pain in left knee: Secondary | ICD-10-CM | POA: Diagnosis not present

## 2022-06-02 DIAGNOSIS — R296 Repeated falls: Secondary | ICD-10-CM | POA: Diagnosis not present

## 2022-06-02 DIAGNOSIS — M11262 Other chondrocalcinosis, left knee: Secondary | ICD-10-CM | POA: Diagnosis not present

## 2022-06-02 DIAGNOSIS — S72012A Unspecified intracapsular fracture of left femur, initial encounter for closed fracture: Secondary | ICD-10-CM | POA: Diagnosis not present

## 2022-06-03 DIAGNOSIS — E871 Hypo-osmolality and hyponatremia: Secondary | ICD-10-CM | POA: Diagnosis not present

## 2022-06-03 DIAGNOSIS — R7989 Other specified abnormal findings of blood chemistry: Secondary | ICD-10-CM | POA: Diagnosis not present

## 2022-06-03 DIAGNOSIS — I10 Essential (primary) hypertension: Secondary | ICD-10-CM | POA: Diagnosis not present

## 2022-06-03 DIAGNOSIS — R64 Cachexia: Secondary | ICD-10-CM | POA: Diagnosis not present

## 2022-06-03 DIAGNOSIS — Z96642 Presence of left artificial hip joint: Secondary | ICD-10-CM | POA: Diagnosis not present

## 2022-06-03 DIAGNOSIS — S72092A Other fracture of head and neck of left femur, initial encounter for closed fracture: Secondary | ICD-10-CM | POA: Diagnosis not present

## 2022-06-03 DIAGNOSIS — E440 Moderate protein-calorie malnutrition: Secondary | ICD-10-CM | POA: Diagnosis not present

## 2022-06-03 DIAGNOSIS — D539 Nutritional anemia, unspecified: Secondary | ICD-10-CM | POA: Diagnosis not present

## 2022-06-03 DIAGNOSIS — J309 Allergic rhinitis, unspecified: Secondary | ICD-10-CM | POA: Diagnosis not present

## 2022-06-03 DIAGNOSIS — J9611 Chronic respiratory failure with hypoxia: Secondary | ICD-10-CM | POA: Diagnosis not present

## 2022-06-03 DIAGNOSIS — Z66 Do not resuscitate: Secondary | ICD-10-CM | POA: Diagnosis not present

## 2022-06-03 DIAGNOSIS — R636 Underweight: Secondary | ICD-10-CM | POA: Diagnosis not present

## 2022-06-03 DIAGNOSIS — Z9181 History of falling: Secondary | ICD-10-CM | POA: Diagnosis not present

## 2022-06-03 DIAGNOSIS — R1310 Dysphagia, unspecified: Secondary | ICD-10-CM | POA: Diagnosis not present

## 2022-06-03 DIAGNOSIS — Z743 Need for continuous supervision: Secondary | ICD-10-CM | POA: Diagnosis not present

## 2022-06-03 DIAGNOSIS — J431 Panlobular emphysema: Secondary | ICD-10-CM | POA: Diagnosis not present

## 2022-06-03 DIAGNOSIS — S72012A Unspecified intracapsular fracture of left femur, initial encounter for closed fracture: Secondary | ICD-10-CM | POA: Diagnosis not present

## 2022-06-03 DIAGNOSIS — K219 Gastro-esophageal reflux disease without esophagitis: Secondary | ICD-10-CM | POA: Diagnosis not present

## 2022-06-03 DIAGNOSIS — J432 Centrilobular emphysema: Secondary | ICD-10-CM | POA: Diagnosis not present

## 2022-06-03 DIAGNOSIS — Z8619 Personal history of other infectious and parasitic diseases: Secondary | ICD-10-CM | POA: Diagnosis not present

## 2022-06-03 DIAGNOSIS — I129 Hypertensive chronic kidney disease with stage 1 through stage 4 chronic kidney disease, or unspecified chronic kidney disease: Secondary | ICD-10-CM | POA: Diagnosis not present

## 2022-06-03 DIAGNOSIS — M6281 Muscle weakness (generalized): Secondary | ICD-10-CM | POA: Diagnosis not present

## 2022-06-03 DIAGNOSIS — J449 Chronic obstructive pulmonary disease, unspecified: Secondary | ICD-10-CM | POA: Diagnosis not present

## 2022-06-03 DIAGNOSIS — J984 Other disorders of lung: Secondary | ICD-10-CM | POA: Diagnosis not present

## 2022-06-03 DIAGNOSIS — S82402A Unspecified fracture of shaft of left fibula, initial encounter for closed fracture: Secondary | ICD-10-CM | POA: Diagnosis not present

## 2022-06-03 DIAGNOSIS — N1832 Chronic kidney disease, stage 3b: Secondary | ICD-10-CM | POA: Diagnosis not present

## 2022-06-03 DIAGNOSIS — D631 Anemia in chronic kidney disease: Secondary | ICD-10-CM | POA: Diagnosis not present

## 2022-06-03 DIAGNOSIS — E43 Unspecified severe protein-calorie malnutrition: Secondary | ICD-10-CM | POA: Diagnosis not present

## 2022-06-03 DIAGNOSIS — R531 Weakness: Secondary | ICD-10-CM | POA: Diagnosis not present

## 2022-06-03 DIAGNOSIS — F17210 Nicotine dependence, cigarettes, uncomplicated: Secondary | ICD-10-CM | POA: Diagnosis not present

## 2022-06-03 DIAGNOSIS — S72002D Fracture of unspecified part of neck of left femur, subsequent encounter for closed fracture with routine healing: Secondary | ICD-10-CM | POA: Diagnosis not present

## 2022-06-03 DIAGNOSIS — S72112A Displaced fracture of greater trochanter of left femur, initial encounter for closed fracture: Secondary | ICD-10-CM | POA: Diagnosis not present

## 2022-06-03 DIAGNOSIS — R627 Adult failure to thrive: Secondary | ICD-10-CM | POA: Diagnosis not present

## 2022-06-03 DIAGNOSIS — Z9151 Personal history of suicidal behavior: Secondary | ICD-10-CM | POA: Diagnosis not present

## 2022-06-03 DIAGNOSIS — S72052A Unspecified fracture of head of left femur, initial encounter for closed fracture: Secondary | ICD-10-CM | POA: Diagnosis not present

## 2022-06-03 DIAGNOSIS — R262 Difficulty in walking, not elsewhere classified: Secondary | ICD-10-CM | POA: Diagnosis not present

## 2022-06-03 DIAGNOSIS — R29898 Other symptoms and signs involving the musculoskeletal system: Secondary | ICD-10-CM | POA: Diagnosis not present

## 2022-06-03 DIAGNOSIS — Z720 Tobacco use: Secondary | ICD-10-CM | POA: Diagnosis not present

## 2022-06-03 DIAGNOSIS — Z9981 Dependence on supplemental oxygen: Secondary | ICD-10-CM | POA: Diagnosis not present

## 2022-06-03 DIAGNOSIS — Z79899 Other long term (current) drug therapy: Secondary | ICD-10-CM | POA: Diagnosis not present

## 2022-06-03 DIAGNOSIS — S72002A Fracture of unspecified part of neck of left femur, initial encounter for closed fracture: Secondary | ICD-10-CM | POA: Diagnosis not present

## 2022-06-03 DIAGNOSIS — A310 Pulmonary mycobacterial infection: Secondary | ICD-10-CM | POA: Diagnosis not present

## 2022-06-03 DIAGNOSIS — Z681 Body mass index (BMI) 19 or less, adult: Secondary | ICD-10-CM | POA: Diagnosis not present

## 2022-06-03 DIAGNOSIS — E039 Hypothyroidism, unspecified: Secondary | ICD-10-CM | POA: Diagnosis not present

## 2022-06-03 DIAGNOSIS — D62 Acute posthemorrhagic anemia: Secondary | ICD-10-CM | POA: Diagnosis not present

## 2022-06-03 DIAGNOSIS — E876 Hypokalemia: Secondary | ICD-10-CM | POA: Diagnosis not present

## 2022-06-03 DIAGNOSIS — J80 Acute respiratory distress syndrome: Secondary | ICD-10-CM | POA: Diagnosis not present

## 2022-06-03 DIAGNOSIS — Z471 Aftercare following joint replacement surgery: Secondary | ICD-10-CM | POA: Diagnosis not present

## 2022-06-03 DIAGNOSIS — T07XXXA Unspecified multiple injuries, initial encounter: Secondary | ICD-10-CM | POA: Diagnosis not present

## 2022-06-03 DIAGNOSIS — G9341 Metabolic encephalopathy: Secondary | ICD-10-CM | POA: Diagnosis not present

## 2022-06-03 DIAGNOSIS — K580 Irritable bowel syndrome with diarrhea: Secondary | ICD-10-CM | POA: Diagnosis not present

## 2022-06-04 DIAGNOSIS — D62 Acute posthemorrhagic anemia: Secondary | ICD-10-CM | POA: Diagnosis not present

## 2022-06-04 DIAGNOSIS — R636 Underweight: Secondary | ICD-10-CM | POA: Diagnosis not present

## 2022-06-04 DIAGNOSIS — E876 Hypokalemia: Secondary | ICD-10-CM | POA: Diagnosis not present

## 2022-06-04 DIAGNOSIS — E43 Unspecified severe protein-calorie malnutrition: Secondary | ICD-10-CM | POA: Diagnosis not present

## 2022-06-04 DIAGNOSIS — J431 Panlobular emphysema: Secondary | ICD-10-CM | POA: Diagnosis not present

## 2022-06-04 DIAGNOSIS — E440 Moderate protein-calorie malnutrition: Secondary | ICD-10-CM | POA: Diagnosis not present

## 2022-06-04 DIAGNOSIS — S72092A Other fracture of head and neck of left femur, initial encounter for closed fracture: Secondary | ICD-10-CM | POA: Diagnosis not present

## 2022-06-04 DIAGNOSIS — E039 Hypothyroidism, unspecified: Secondary | ICD-10-CM | POA: Diagnosis not present

## 2022-06-04 DIAGNOSIS — N1832 Chronic kidney disease, stage 3b: Secondary | ICD-10-CM | POA: Diagnosis not present

## 2022-06-04 DIAGNOSIS — K219 Gastro-esophageal reflux disease without esophagitis: Secondary | ICD-10-CM | POA: Diagnosis not present

## 2022-06-04 DIAGNOSIS — Z471 Aftercare following joint replacement surgery: Secondary | ICD-10-CM | POA: Diagnosis not present

## 2022-06-04 DIAGNOSIS — E871 Hypo-osmolality and hyponatremia: Secondary | ICD-10-CM | POA: Diagnosis not present

## 2022-06-04 DIAGNOSIS — Z96642 Presence of left artificial hip joint: Secondary | ICD-10-CM | POA: Diagnosis not present

## 2022-06-04 DIAGNOSIS — I10 Essential (primary) hypertension: Secondary | ICD-10-CM | POA: Diagnosis not present

## 2022-06-04 DIAGNOSIS — I129 Hypertensive chronic kidney disease with stage 1 through stage 4 chronic kidney disease, or unspecified chronic kidney disease: Secondary | ICD-10-CM | POA: Diagnosis not present

## 2022-06-04 DIAGNOSIS — S72052A Unspecified fracture of head of left femur, initial encounter for closed fracture: Secondary | ICD-10-CM | POA: Diagnosis not present

## 2022-06-04 DIAGNOSIS — S72012A Unspecified intracapsular fracture of left femur, initial encounter for closed fracture: Secondary | ICD-10-CM | POA: Diagnosis not present

## 2022-06-04 DIAGNOSIS — S72002A Fracture of unspecified part of neck of left femur, initial encounter for closed fracture: Secondary | ICD-10-CM | POA: Diagnosis not present

## 2022-06-05 DIAGNOSIS — S72002A Fracture of unspecified part of neck of left femur, initial encounter for closed fracture: Secondary | ICD-10-CM | POA: Diagnosis not present

## 2022-06-05 DIAGNOSIS — I10 Essential (primary) hypertension: Secondary | ICD-10-CM | POA: Diagnosis not present

## 2022-06-05 DIAGNOSIS — K219 Gastro-esophageal reflux disease without esophagitis: Secondary | ICD-10-CM | POA: Diagnosis not present

## 2022-06-05 DIAGNOSIS — E871 Hypo-osmolality and hyponatremia: Secondary | ICD-10-CM | POA: Diagnosis not present

## 2022-06-05 DIAGNOSIS — E039 Hypothyroidism, unspecified: Secondary | ICD-10-CM | POA: Diagnosis not present

## 2022-06-06 DIAGNOSIS — I10 Essential (primary) hypertension: Secondary | ICD-10-CM | POA: Diagnosis not present

## 2022-06-06 DIAGNOSIS — E871 Hypo-osmolality and hyponatremia: Secondary | ICD-10-CM | POA: Diagnosis not present

## 2022-06-06 DIAGNOSIS — S72002A Fracture of unspecified part of neck of left femur, initial encounter for closed fracture: Secondary | ICD-10-CM | POA: Diagnosis not present

## 2022-06-06 DIAGNOSIS — K219 Gastro-esophageal reflux disease without esophagitis: Secondary | ICD-10-CM | POA: Diagnosis not present

## 2022-06-06 DIAGNOSIS — E039 Hypothyroidism, unspecified: Secondary | ICD-10-CM | POA: Diagnosis not present

## 2022-06-07 DIAGNOSIS — Z79899 Other long term (current) drug therapy: Secondary | ICD-10-CM | POA: Diagnosis not present

## 2022-06-07 DIAGNOSIS — E039 Hypothyroidism, unspecified: Secondary | ICD-10-CM | POA: Diagnosis not present

## 2022-06-07 DIAGNOSIS — I10 Essential (primary) hypertension: Secondary | ICD-10-CM | POA: Diagnosis not present

## 2022-06-07 DIAGNOSIS — E871 Hypo-osmolality and hyponatremia: Secondary | ICD-10-CM | POA: Diagnosis not present

## 2022-06-07 DIAGNOSIS — R7989 Other specified abnormal findings of blood chemistry: Secondary | ICD-10-CM | POA: Diagnosis not present

## 2022-06-07 DIAGNOSIS — R627 Adult failure to thrive: Secondary | ICD-10-CM | POA: Diagnosis not present

## 2022-06-08 DIAGNOSIS — Z79899 Other long term (current) drug therapy: Secondary | ICD-10-CM | POA: Diagnosis not present

## 2022-06-08 DIAGNOSIS — E039 Hypothyroidism, unspecified: Secondary | ICD-10-CM | POA: Diagnosis not present

## 2022-06-08 DIAGNOSIS — R7989 Other specified abnormal findings of blood chemistry: Secondary | ICD-10-CM | POA: Diagnosis not present

## 2022-06-09 DIAGNOSIS — E039 Hypothyroidism, unspecified: Secondary | ICD-10-CM | POA: Diagnosis not present

## 2022-06-09 DIAGNOSIS — R627 Adult failure to thrive: Secondary | ICD-10-CM | POA: Diagnosis not present

## 2022-06-09 DIAGNOSIS — I10 Essential (primary) hypertension: Secondary | ICD-10-CM | POA: Diagnosis not present

## 2022-06-09 DIAGNOSIS — E871 Hypo-osmolality and hyponatremia: Secondary | ICD-10-CM | POA: Diagnosis not present

## 2022-06-09 DIAGNOSIS — Z79899 Other long term (current) drug therapy: Secondary | ICD-10-CM | POA: Diagnosis not present

## 2022-06-09 DIAGNOSIS — R7989 Other specified abnormal findings of blood chemistry: Secondary | ICD-10-CM | POA: Diagnosis not present

## 2022-06-10 DIAGNOSIS — R627 Adult failure to thrive: Secondary | ICD-10-CM | POA: Diagnosis not present

## 2022-06-10 DIAGNOSIS — E43 Unspecified severe protein-calorie malnutrition: Secondary | ICD-10-CM | POA: Diagnosis not present

## 2022-06-10 DIAGNOSIS — J449 Chronic obstructive pulmonary disease, unspecified: Secondary | ICD-10-CM | POA: Diagnosis not present

## 2022-06-10 DIAGNOSIS — Z720 Tobacco use: Secondary | ICD-10-CM | POA: Diagnosis not present

## 2022-06-10 DIAGNOSIS — E039 Hypothyroidism, unspecified: Secondary | ICD-10-CM | POA: Diagnosis not present

## 2022-06-10 DIAGNOSIS — R7989 Other specified abnormal findings of blood chemistry: Secondary | ICD-10-CM | POA: Diagnosis not present

## 2022-06-10 DIAGNOSIS — I10 Essential (primary) hypertension: Secondary | ICD-10-CM | POA: Diagnosis not present

## 2022-06-10 DIAGNOSIS — Z79899 Other long term (current) drug therapy: Secondary | ICD-10-CM | POA: Diagnosis not present

## 2022-06-10 DIAGNOSIS — E871 Hypo-osmolality and hyponatremia: Secondary | ICD-10-CM | POA: Diagnosis not present

## 2022-06-14 ENCOUNTER — Telehealth: Payer: Self-pay | Admitting: Hospice

## 2022-06-14 DIAGNOSIS — I10 Essential (primary) hypertension: Secondary | ICD-10-CM | POA: Diagnosis not present

## 2022-06-14 DIAGNOSIS — K219 Gastro-esophageal reflux disease without esophagitis: Secondary | ICD-10-CM | POA: Diagnosis not present

## 2022-06-14 DIAGNOSIS — E440 Moderate protein-calorie malnutrition: Secondary | ICD-10-CM | POA: Diagnosis not present

## 2022-06-14 DIAGNOSIS — E43 Unspecified severe protein-calorie malnutrition: Secondary | ICD-10-CM | POA: Diagnosis not present

## 2022-06-14 DIAGNOSIS — Z515 Encounter for palliative care: Secondary | ICD-10-CM

## 2022-06-14 DIAGNOSIS — S72002A Fracture of unspecified part of neck of left femur, initial encounter for closed fracture: Secondary | ICD-10-CM | POA: Diagnosis not present

## 2022-06-14 DIAGNOSIS — R636 Underweight: Secondary | ICD-10-CM | POA: Diagnosis not present

## 2022-06-14 DIAGNOSIS — D62 Acute posthemorrhagic anemia: Secondary | ICD-10-CM | POA: Diagnosis not present

## 2022-06-14 DIAGNOSIS — E871 Hypo-osmolality and hyponatremia: Secondary | ICD-10-CM | POA: Diagnosis not present

## 2022-06-14 DIAGNOSIS — E039 Hypothyroidism, unspecified: Secondary | ICD-10-CM | POA: Diagnosis not present

## 2022-06-14 NOTE — Telephone Encounter (Signed)
NP called [name] to follow-up on patient.  She said patient is currently hospitalized for hip fracture.  She would call to inform when patient is discharged.  Ample emotional support provided.

## 2022-06-15 DIAGNOSIS — Z66 Do not resuscitate: Secondary | ICD-10-CM | POA: Diagnosis not present

## 2022-06-15 DIAGNOSIS — Z8619 Personal history of other infectious and parasitic diseases: Secondary | ICD-10-CM | POA: Diagnosis not present

## 2022-06-15 DIAGNOSIS — Z9981 Dependence on supplemental oxygen: Secondary | ICD-10-CM | POA: Diagnosis not present

## 2022-06-15 DIAGNOSIS — R262 Difficulty in walking, not elsewhere classified: Secondary | ICD-10-CM | POA: Diagnosis not present

## 2022-06-15 DIAGNOSIS — Z471 Aftercare following joint replacement surgery: Secondary | ICD-10-CM | POA: Diagnosis not present

## 2022-06-15 DIAGNOSIS — J432 Centrilobular emphysema: Secondary | ICD-10-CM | POA: Diagnosis not present

## 2022-06-15 DIAGNOSIS — L89153 Pressure ulcer of sacral region, stage 3: Secondary | ICD-10-CM | POA: Diagnosis not present

## 2022-06-15 DIAGNOSIS — E539 Vitamin B deficiency, unspecified: Secondary | ICD-10-CM | POA: Diagnosis not present

## 2022-06-15 DIAGNOSIS — K580 Irritable bowel syndrome with diarrhea: Secondary | ICD-10-CM | POA: Diagnosis not present

## 2022-06-15 DIAGNOSIS — E440 Moderate protein-calorie malnutrition: Secondary | ICD-10-CM | POA: Diagnosis not present

## 2022-06-15 DIAGNOSIS — J449 Chronic obstructive pulmonary disease, unspecified: Secondary | ICD-10-CM | POA: Diagnosis not present

## 2022-06-15 DIAGNOSIS — R300 Dysuria: Secondary | ICD-10-CM | POA: Diagnosis not present

## 2022-06-15 DIAGNOSIS — R0902 Hypoxemia: Secondary | ICD-10-CM | POA: Diagnosis not present

## 2022-06-15 DIAGNOSIS — R627 Adult failure to thrive: Secondary | ICD-10-CM | POA: Diagnosis not present

## 2022-06-15 DIAGNOSIS — I10 Essential (primary) hypertension: Secondary | ICD-10-CM | POA: Diagnosis not present

## 2022-06-15 DIAGNOSIS — F17210 Nicotine dependence, cigarettes, uncomplicated: Secondary | ICD-10-CM | POA: Diagnosis not present

## 2022-06-15 DIAGNOSIS — Z4889 Encounter for other specified surgical aftercare: Secondary | ICD-10-CM | POA: Diagnosis not present

## 2022-06-15 DIAGNOSIS — M25552 Pain in left hip: Secondary | ICD-10-CM | POA: Diagnosis not present

## 2022-06-15 DIAGNOSIS — R1310 Dysphagia, unspecified: Secondary | ICD-10-CM | POA: Diagnosis not present

## 2022-06-15 DIAGNOSIS — Z9151 Personal history of suicidal behavior: Secondary | ICD-10-CM | POA: Diagnosis not present

## 2022-06-15 DIAGNOSIS — D62 Acute posthemorrhagic anemia: Secondary | ICD-10-CM | POA: Diagnosis not present

## 2022-06-15 DIAGNOSIS — M6281 Muscle weakness (generalized): Secondary | ICD-10-CM | POA: Diagnosis not present

## 2022-06-15 DIAGNOSIS — N390 Urinary tract infection, site not specified: Secondary | ICD-10-CM | POA: Diagnosis not present

## 2022-06-15 DIAGNOSIS — S72002A Fracture of unspecified part of neck of left femur, initial encounter for closed fracture: Secondary | ICD-10-CM | POA: Diagnosis not present

## 2022-06-15 DIAGNOSIS — N3001 Acute cystitis with hematuria: Secondary | ICD-10-CM | POA: Diagnosis not present

## 2022-06-15 DIAGNOSIS — A310 Pulmonary mycobacterial infection: Secondary | ICD-10-CM | POA: Diagnosis not present

## 2022-06-15 DIAGNOSIS — S72002D Fracture of unspecified part of neck of left femur, subsequent encounter for closed fracture with routine healing: Secondary | ICD-10-CM | POA: Diagnosis not present

## 2022-06-15 DIAGNOSIS — R29898 Other symptoms and signs involving the musculoskeletal system: Secondary | ICD-10-CM | POA: Diagnosis not present

## 2022-06-15 DIAGNOSIS — E559 Vitamin D deficiency, unspecified: Secondary | ICD-10-CM | POA: Diagnosis not present

## 2022-06-15 DIAGNOSIS — Z9181 History of falling: Secondary | ICD-10-CM | POA: Diagnosis not present

## 2022-06-15 DIAGNOSIS — E039 Hypothyroidism, unspecified: Secondary | ICD-10-CM | POA: Diagnosis not present

## 2022-06-15 DIAGNOSIS — R350 Frequency of micturition: Secondary | ICD-10-CM | POA: Diagnosis not present

## 2022-06-15 DIAGNOSIS — Z743 Need for continuous supervision: Secondary | ICD-10-CM | POA: Diagnosis not present

## 2022-06-15 DIAGNOSIS — F32A Depression, unspecified: Secondary | ICD-10-CM | POA: Diagnosis not present

## 2022-06-15 DIAGNOSIS — D649 Anemia, unspecified: Secondary | ICD-10-CM | POA: Diagnosis not present

## 2022-06-16 DIAGNOSIS — E039 Hypothyroidism, unspecified: Secondary | ICD-10-CM | POA: Diagnosis not present

## 2022-06-16 DIAGNOSIS — A310 Pulmonary mycobacterial infection: Secondary | ICD-10-CM | POA: Diagnosis not present

## 2022-06-16 DIAGNOSIS — F32A Depression, unspecified: Secondary | ICD-10-CM | POA: Diagnosis not present

## 2022-06-16 DIAGNOSIS — J432 Centrilobular emphysema: Secondary | ICD-10-CM | POA: Diagnosis not present

## 2022-06-17 DIAGNOSIS — E039 Hypothyroidism, unspecified: Secondary | ICD-10-CM | POA: Diagnosis not present

## 2022-06-17 DIAGNOSIS — M6281 Muscle weakness (generalized): Secondary | ICD-10-CM | POA: Diagnosis not present

## 2022-06-18 IMAGING — DX DG CHEST 2V
3 series · 3 of 3 positions shown · non-contrast
Comparison: None.

CLINICAL DATA: Generalized weakness, leukocytosis

EXAM:
CHEST - 2 VIEW

[chest lat (1 of 2)]
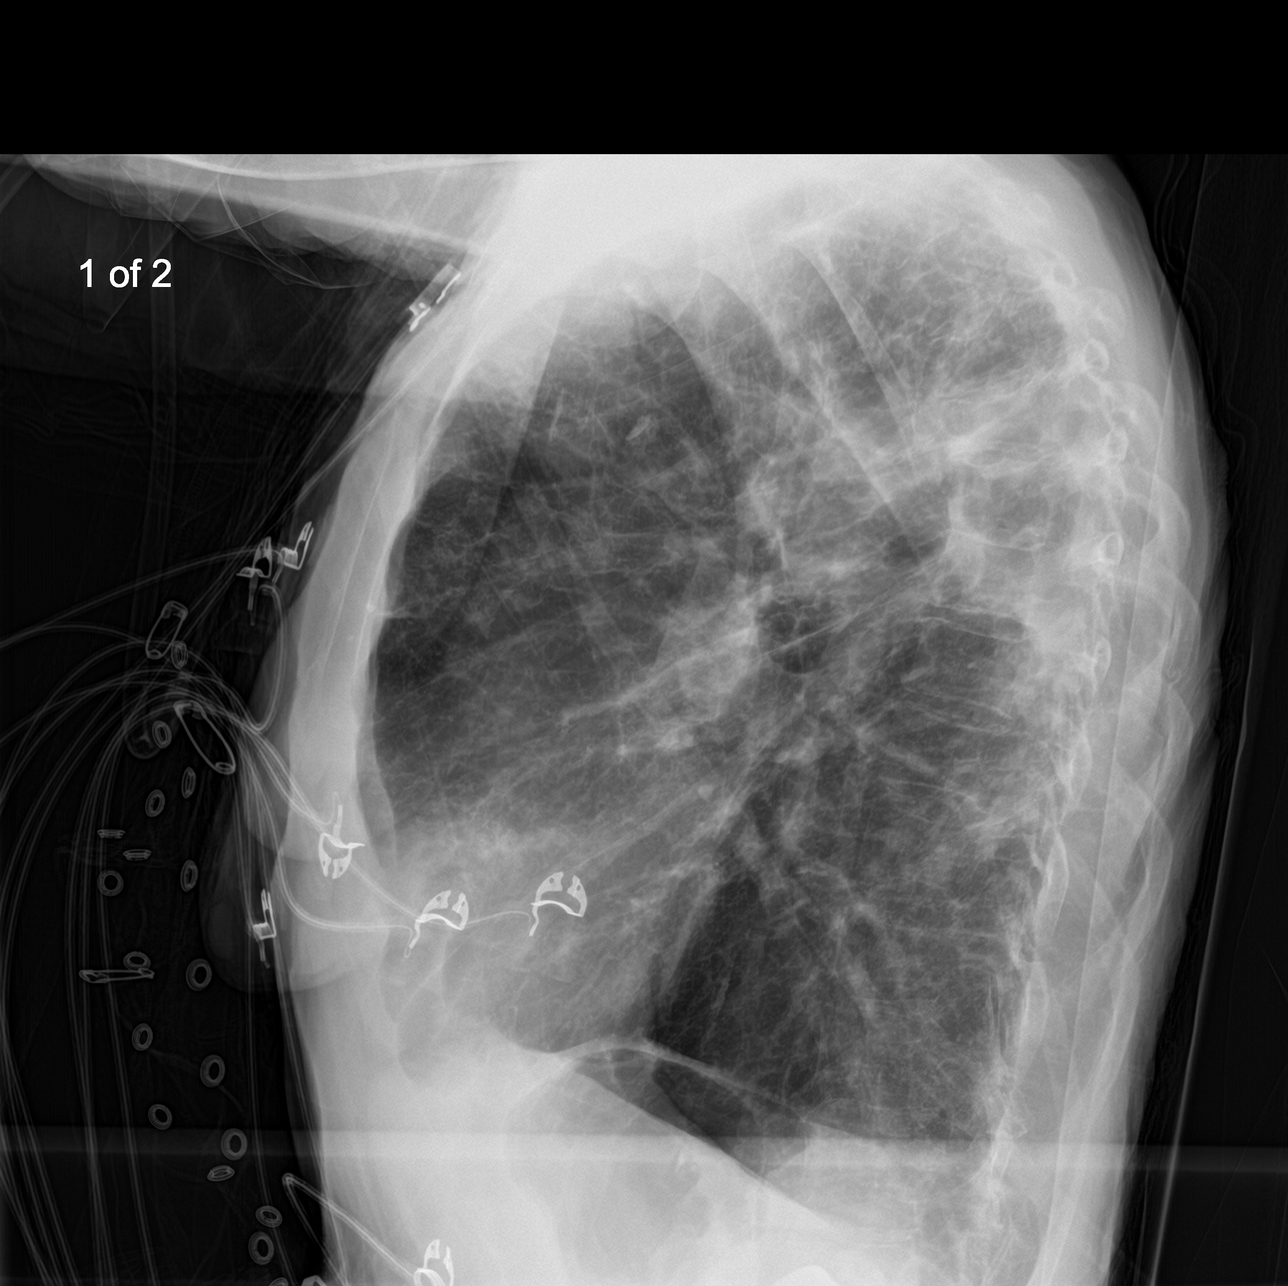

[chest ap]
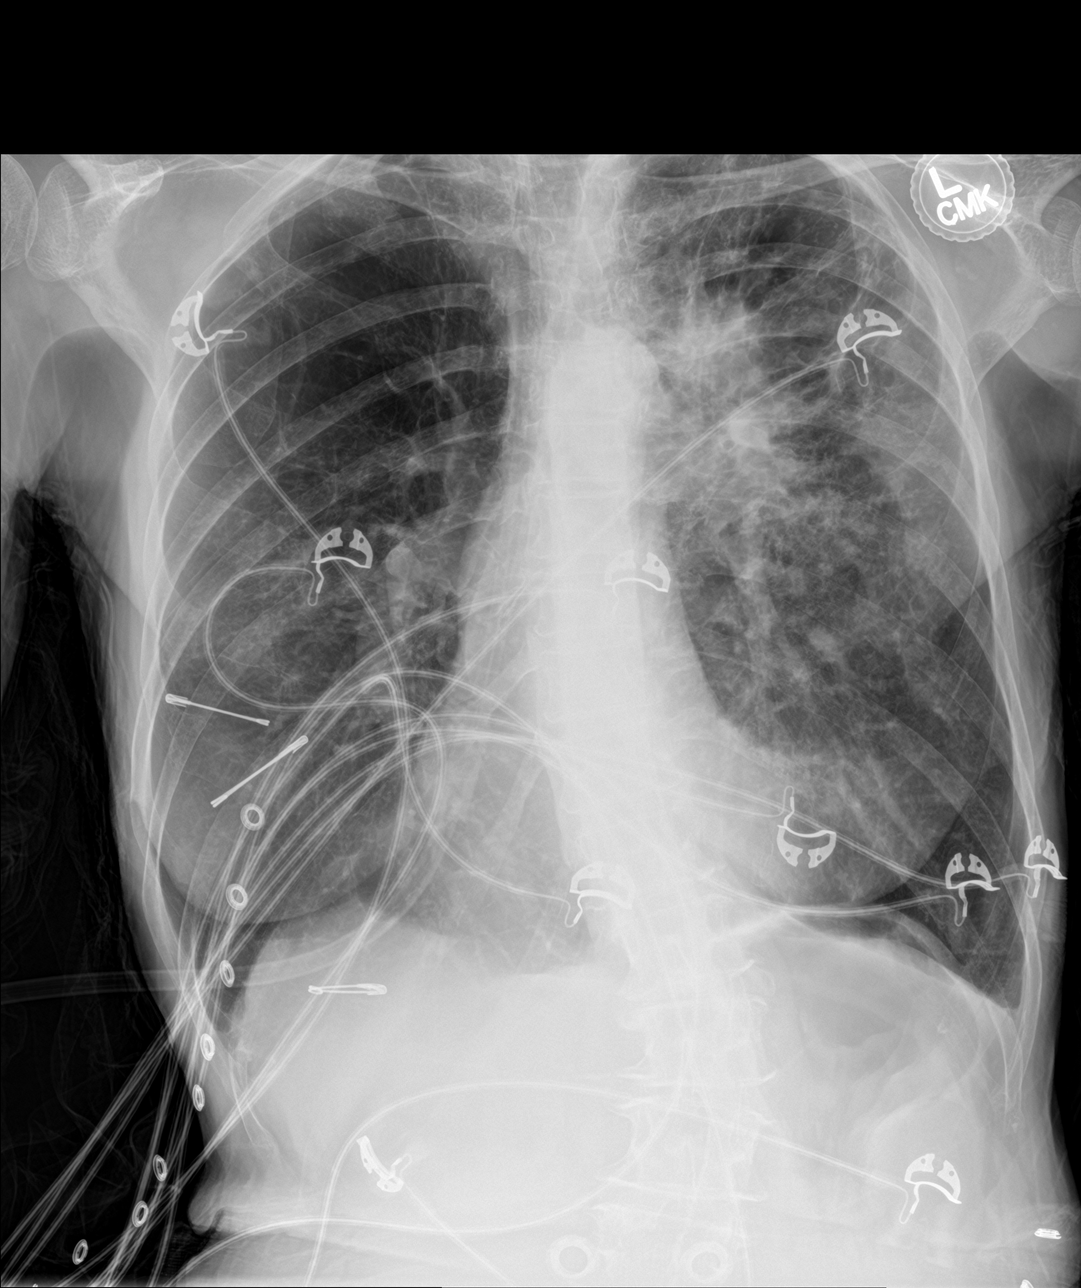

[chest lat (2 of 2)]
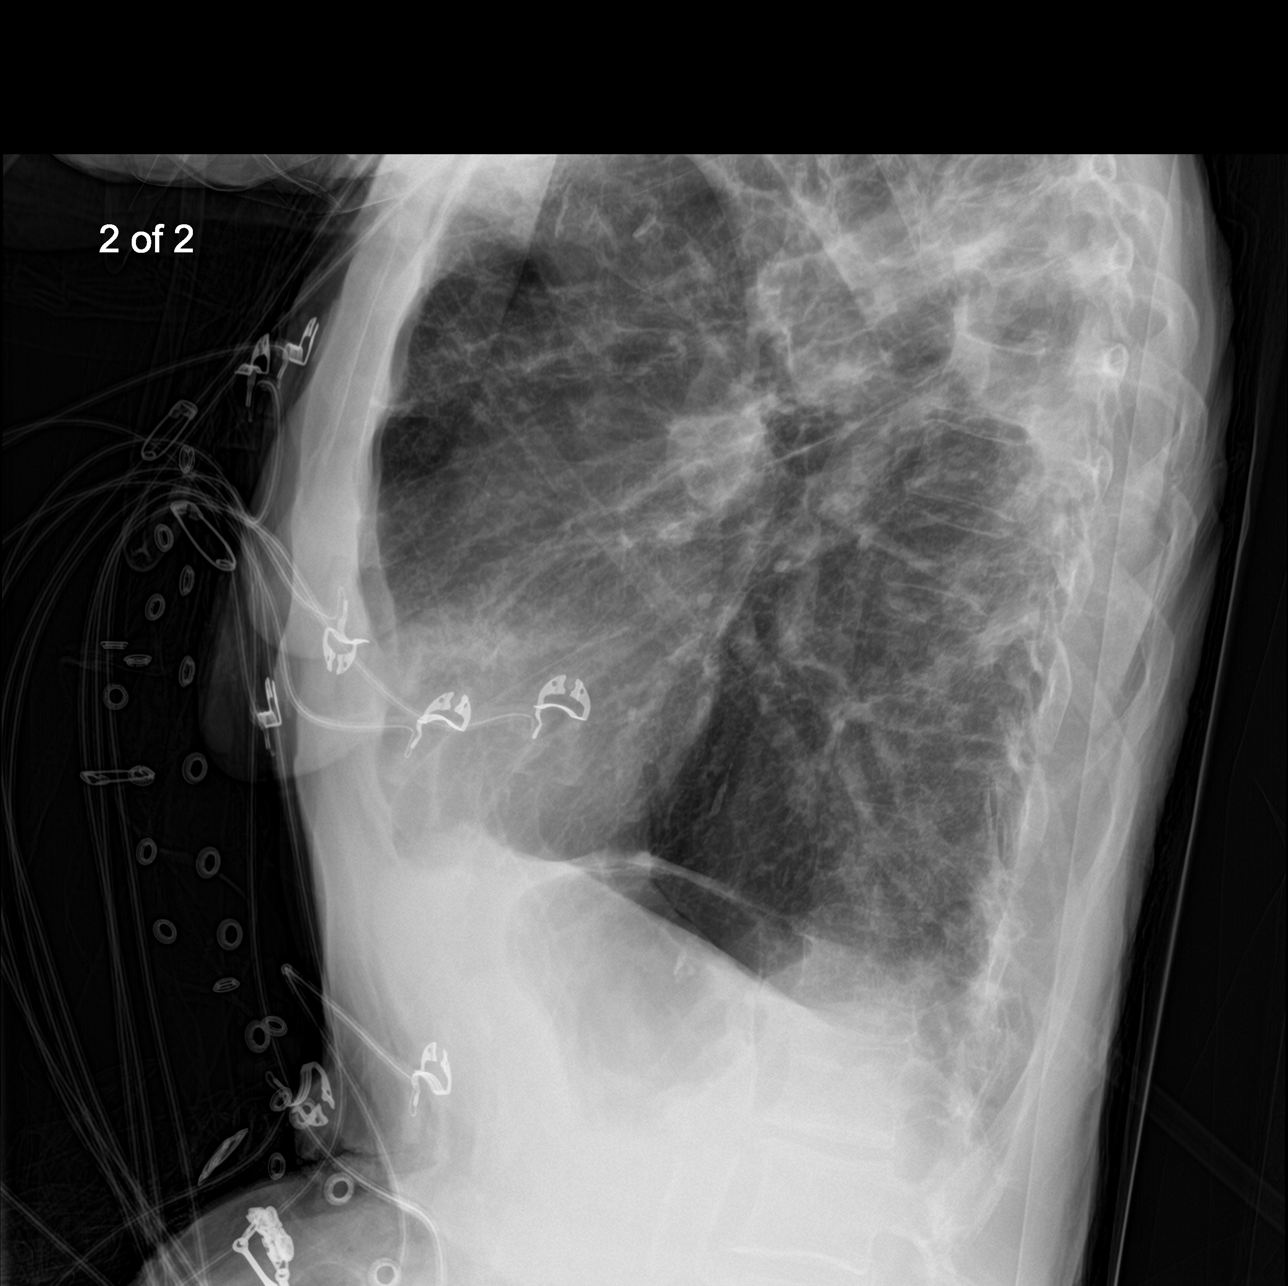

[3 of 3 positions shown; findings below may reference images not displayed]

FINDINGS: Frontal and lateral views of the chest are obtained. Cardiac
silhouette is unremarkable. There is masslike consolidation in the
left suprahilar region, with spiculated margins. While this could
reflect pneumonia, close follow-up will be required to exclude
underlying mass lesion. No effusion or pneumothorax. Background
emphysema. No acute bony abnormalities.
IMPRESSION: 1. Masslike consolidation within the left upper lobe suprahilar
region. Given clinical history, this could reflect left upper lobe
pneumonia. However follow-up PA and lateral chest X-ray is
recommended in 3-4 weeks following trial of antibiotic therapy to
ensure resolution and exclude underlying malignancy.
2. Emphysema.

## 2022-06-21 DIAGNOSIS — L89153 Pressure ulcer of sacral region, stage 3: Secondary | ICD-10-CM | POA: Diagnosis not present

## 2022-06-21 DIAGNOSIS — S72002A Fracture of unspecified part of neck of left femur, initial encounter for closed fracture: Secondary | ICD-10-CM | POA: Diagnosis not present

## 2022-06-22 DIAGNOSIS — R0902 Hypoxemia: Secondary | ICD-10-CM | POA: Diagnosis not present

## 2022-06-24 DIAGNOSIS — M6281 Muscle weakness (generalized): Secondary | ICD-10-CM | POA: Diagnosis not present

## 2022-06-24 DIAGNOSIS — I10 Essential (primary) hypertension: Secondary | ICD-10-CM | POA: Diagnosis not present

## 2022-06-24 IMAGING — CT CT HEAD W/O CM
3 series · 14 of 47 positions shown, 16 images · non-contrast
Comparison: None.

CLINICAL DATA: Mental status change



[Series 2: head wo · axial · 0.49mm/px · z∈[+1138,+1278]mm · 8 of 34 slices shown, 10 images]
[im 3/34  brain]
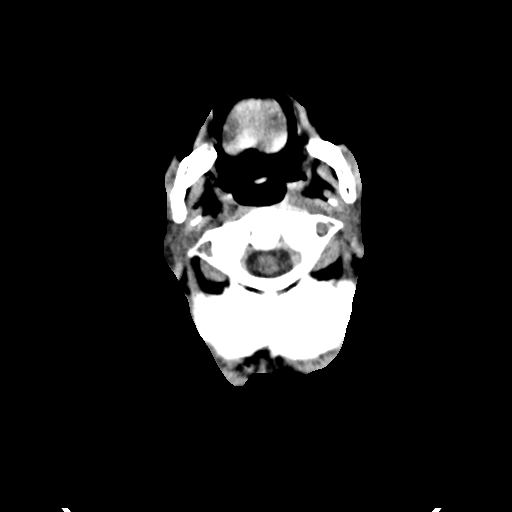
[im 3/34  bone]
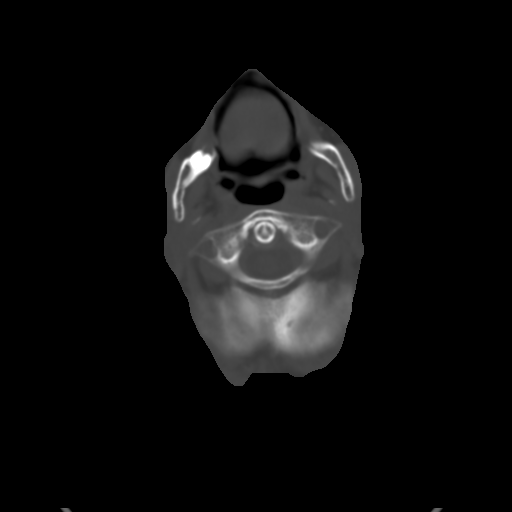
[im 7/34  brain]
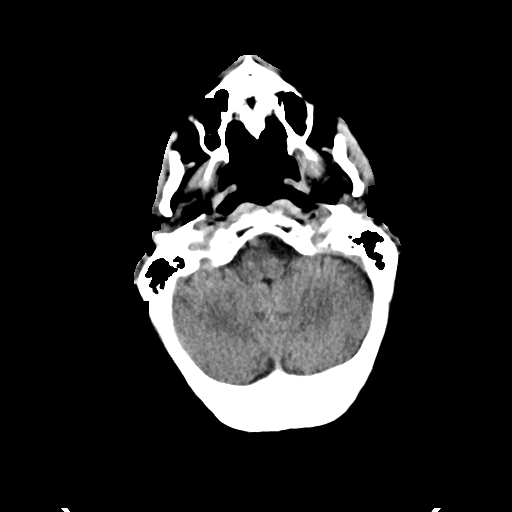
[im 11/34  brain]
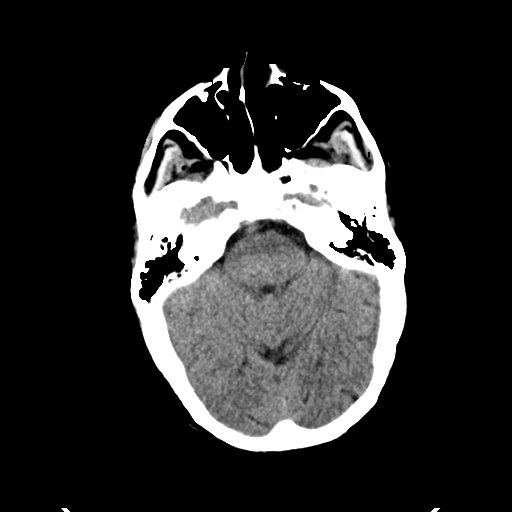
[im 15/34  brain]
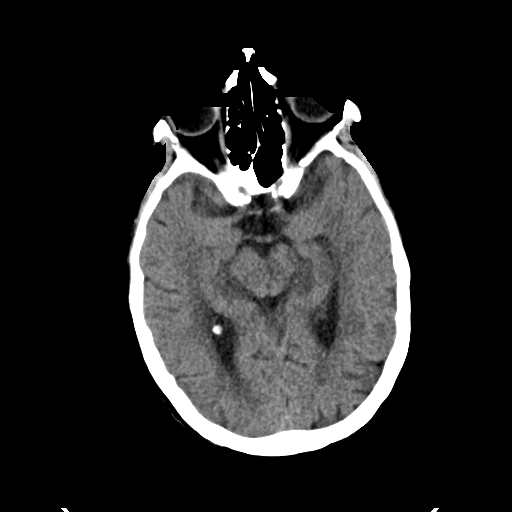
[im 19/34  brain]
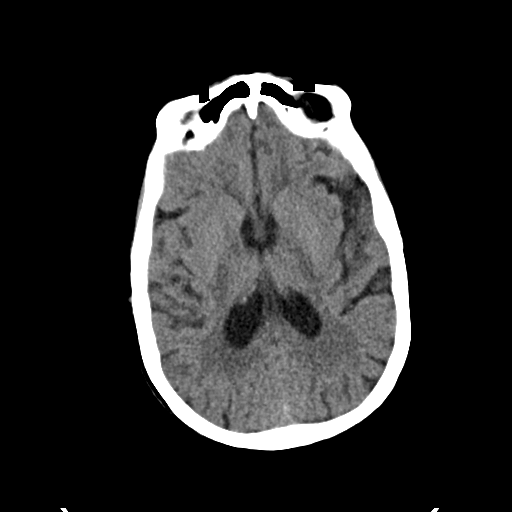
[im 19/34  bone]
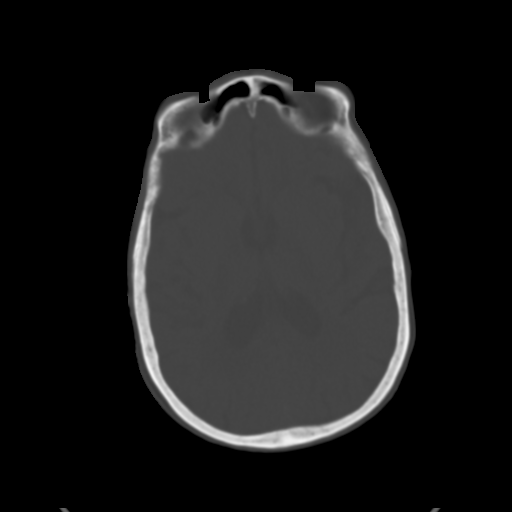
[im 23/34  brain]
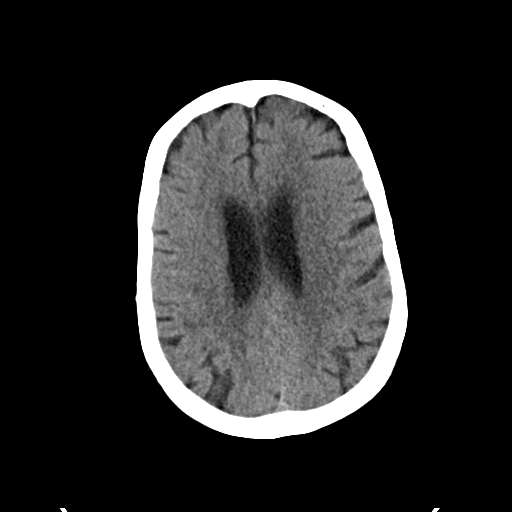
[im 27/34  brain]
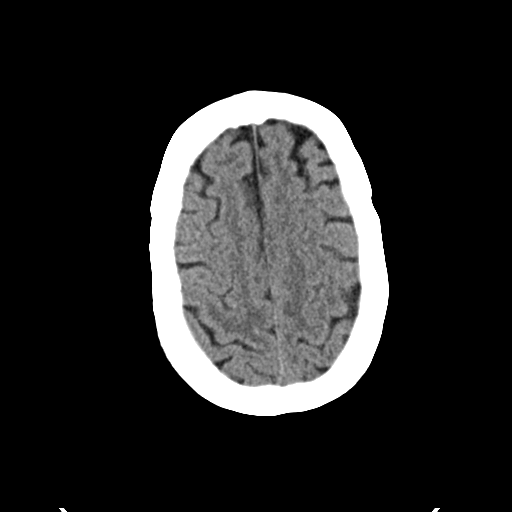
[im 31/34  brain]
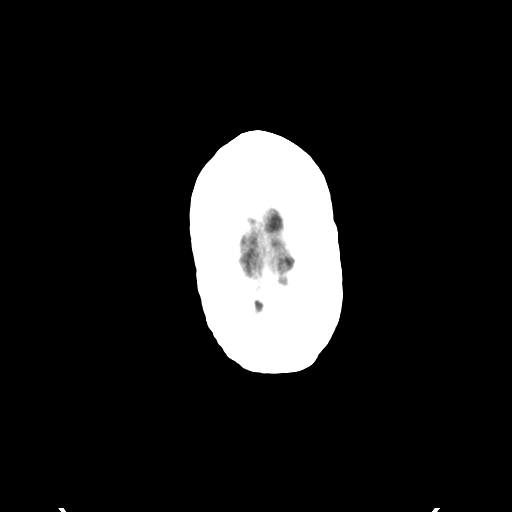

[Series 4: coronal soft · coronal · 0.32mm/px · 3 of 73 slices shown]
[im 25/73  brain]
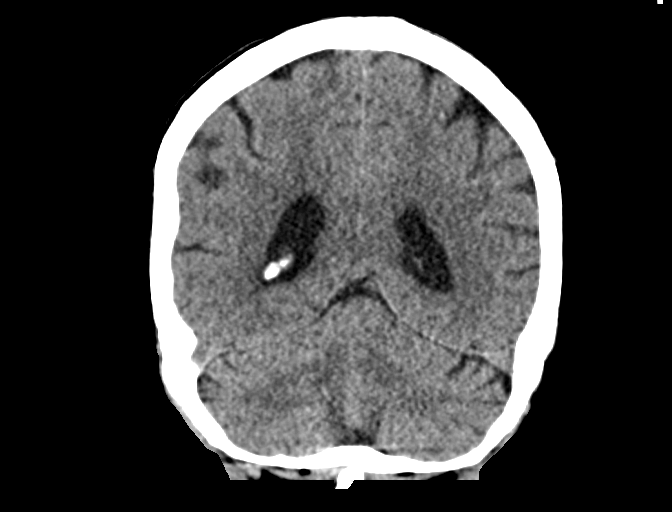
[im 33/73  brain]
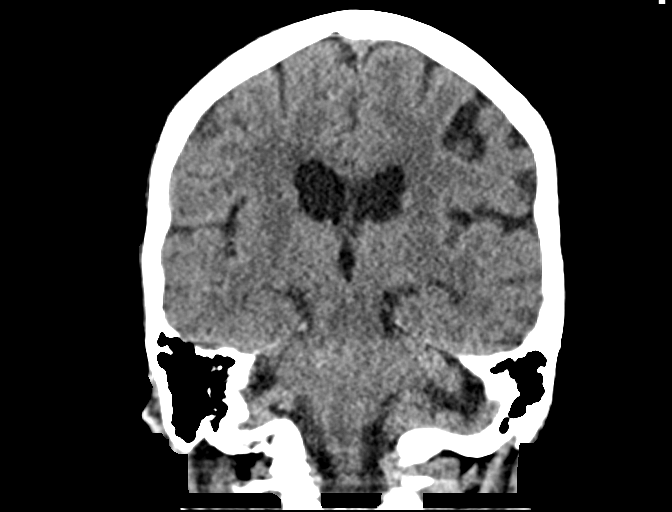
[im 41/73  brain]
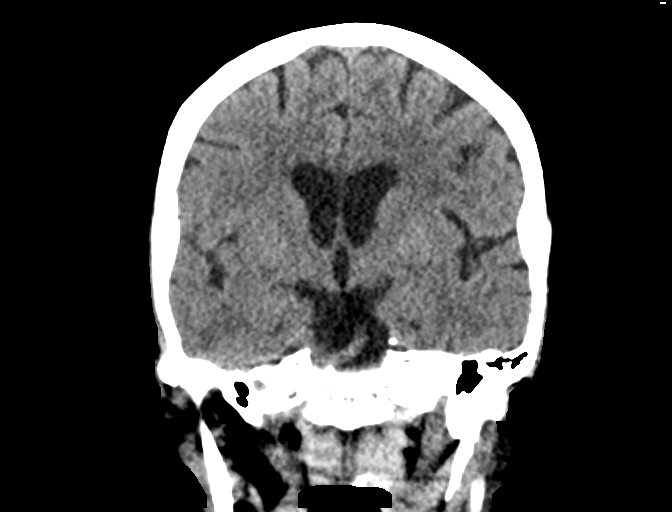

[Series 5: sag soft · sagittal · 0.32mm/px · 3 of 67 slices shown]
[im 23/67  brain]
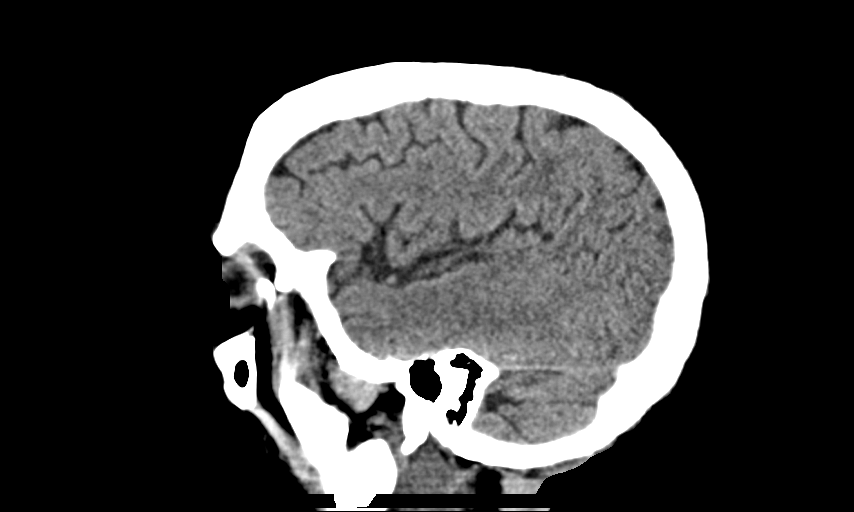
[im 34/67  brain]
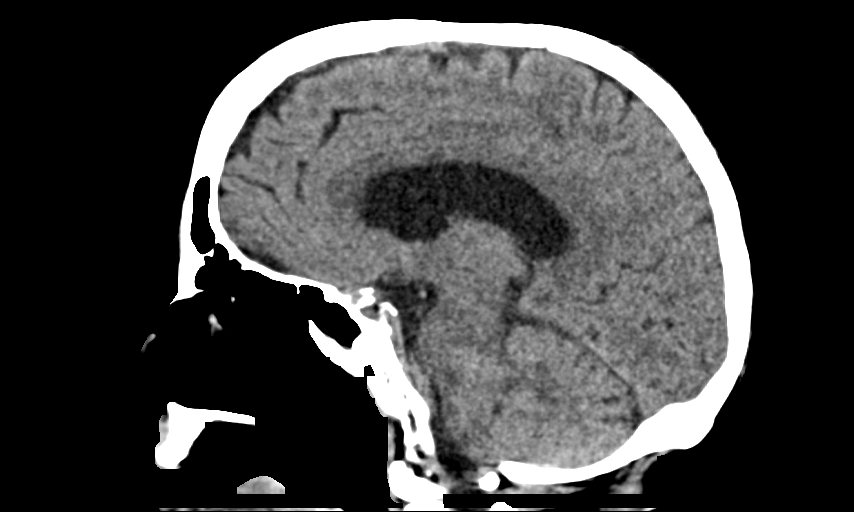
[im 45/67  brain]
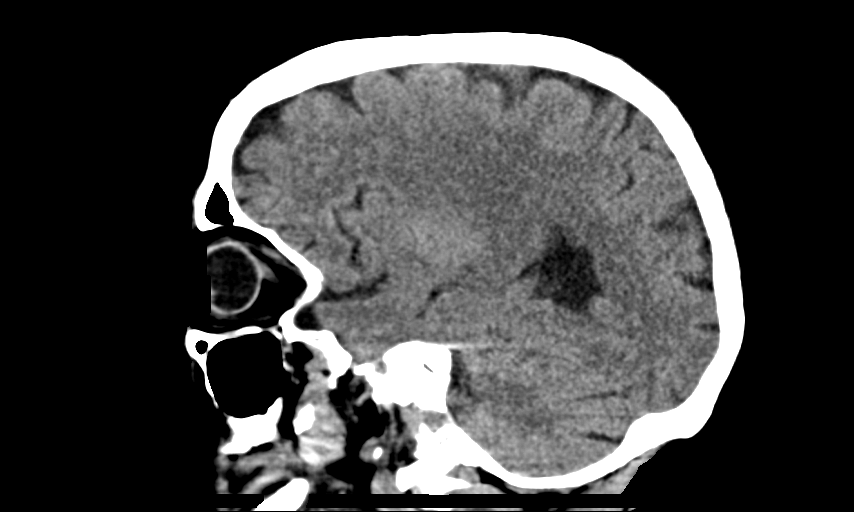

[14 of 47 positions shown; findings below may reference images not displayed]

FINDINGS: Brain: No evidence of acute infarction, hemorrhage, hydrocephalus,
extra-axial collection or mass lesion/mass effect. Scattered area of
low attenuation in the periventricular and subcortical white matter
presumed mild chronic microvascular ischemic changes.

Vascular: No hyperdense vessel or unexpected calcification.

Skull: Normal. Negative for fracture or focal lesion.

Sinuses/Orbits: No acute finding.

Other: None.
IMPRESSION: No acute intracranial abnormality.

## 2022-06-25 DIAGNOSIS — N390 Urinary tract infection, site not specified: Secondary | ICD-10-CM | POA: Diagnosis not present

## 2022-06-28 DIAGNOSIS — R300 Dysuria: Secondary | ICD-10-CM | POA: Diagnosis not present

## 2022-06-28 DIAGNOSIS — L89153 Pressure ulcer of sacral region, stage 3: Secondary | ICD-10-CM | POA: Diagnosis not present

## 2022-06-28 DIAGNOSIS — N3001 Acute cystitis with hematuria: Secondary | ICD-10-CM | POA: Diagnosis not present

## 2022-06-28 DIAGNOSIS — R350 Frequency of micturition: Secondary | ICD-10-CM | POA: Diagnosis not present

## 2022-06-29 DIAGNOSIS — Z4889 Encounter for other specified surgical aftercare: Secondary | ICD-10-CM | POA: Diagnosis not present

## 2022-06-29 DIAGNOSIS — M25552 Pain in left hip: Secondary | ICD-10-CM | POA: Diagnosis not present

## 2022-07-01 DIAGNOSIS — D62 Acute posthemorrhagic anemia: Secondary | ICD-10-CM | POA: Diagnosis not present

## 2022-07-01 DIAGNOSIS — E539 Vitamin B deficiency, unspecified: Secondary | ICD-10-CM | POA: Diagnosis not present

## 2022-07-01 DIAGNOSIS — E559 Vitamin D deficiency, unspecified: Secondary | ICD-10-CM | POA: Diagnosis not present

## 2022-07-01 DIAGNOSIS — D649 Anemia, unspecified: Secondary | ICD-10-CM | POA: Diagnosis not present

## 2022-07-03 DIAGNOSIS — J449 Chronic obstructive pulmonary disease, unspecified: Secondary | ICD-10-CM | POA: Diagnosis not present

## 2022-07-11 DIAGNOSIS — E43 Unspecified severe protein-calorie malnutrition: Secondary | ICD-10-CM | POA: Diagnosis not present

## 2022-07-11 DIAGNOSIS — J449 Chronic obstructive pulmonary disease, unspecified: Secondary | ICD-10-CM | POA: Diagnosis not present

## 2022-07-11 DIAGNOSIS — Z720 Tobacco use: Secondary | ICD-10-CM | POA: Diagnosis not present

## 2022-07-12 DIAGNOSIS — R600 Localized edema: Secondary | ICD-10-CM | POA: Diagnosis not present

## 2022-07-23 DIAGNOSIS — R0902 Hypoxemia: Secondary | ICD-10-CM | POA: Diagnosis not present

## 2022-07-25 DIAGNOSIS — R300 Dysuria: Secondary | ICD-10-CM | POA: Diagnosis not present

## 2022-07-26 DIAGNOSIS — R8281 Pyuria: Secondary | ICD-10-CM | POA: Diagnosis not present

## 2022-07-26 DIAGNOSIS — R451 Restlessness and agitation: Secondary | ICD-10-CM | POA: Diagnosis not present

## 2022-07-27 ENCOUNTER — Encounter: Payer: Self-pay | Admitting: General Practice

## 2022-07-27 DIAGNOSIS — D649 Anemia, unspecified: Secondary | ICD-10-CM | POA: Diagnosis not present

## 2022-07-27 DIAGNOSIS — S72002D Fracture of unspecified part of neck of left femur, subsequent encounter for closed fracture with routine healing: Secondary | ICD-10-CM | POA: Diagnosis not present

## 2022-08-03 DIAGNOSIS — J449 Chronic obstructive pulmonary disease, unspecified: Secondary | ICD-10-CM | POA: Diagnosis not present

## 2022-08-09 DIAGNOSIS — K580 Irritable bowel syndrome with diarrhea: Secondary | ICD-10-CM | POA: Diagnosis not present

## 2022-08-11 DIAGNOSIS — J449 Chronic obstructive pulmonary disease, unspecified: Secondary | ICD-10-CM | POA: Diagnosis not present

## 2022-08-11 DIAGNOSIS — E43 Unspecified severe protein-calorie malnutrition: Secondary | ICD-10-CM | POA: Diagnosis not present

## 2022-08-11 DIAGNOSIS — Z720 Tobacco use: Secondary | ICD-10-CM | POA: Diagnosis not present

## 2022-08-18 DIAGNOSIS — R1084 Generalized abdominal pain: Secondary | ICD-10-CM | POA: Diagnosis not present

## 2022-08-18 DIAGNOSIS — R197 Diarrhea, unspecified: Secondary | ICD-10-CM | POA: Diagnosis not present

## 2022-08-18 DIAGNOSIS — R1030 Lower abdominal pain, unspecified: Secondary | ICD-10-CM | POA: Diagnosis not present

## 2022-08-18 DIAGNOSIS — H9201 Otalgia, right ear: Secondary | ICD-10-CM | POA: Diagnosis not present

## 2022-08-18 DIAGNOSIS — H6121 Impacted cerumen, right ear: Secondary | ICD-10-CM | POA: Diagnosis not present

## 2022-08-18 DIAGNOSIS — R140 Abdominal distension (gaseous): Secondary | ICD-10-CM | POA: Diagnosis not present

## 2022-08-19 DIAGNOSIS — R1084 Generalized abdominal pain: Secondary | ICD-10-CM | POA: Diagnosis not present

## 2022-08-19 DIAGNOSIS — N200 Calculus of kidney: Secondary | ICD-10-CM | POA: Diagnosis not present

## 2022-08-19 DIAGNOSIS — R197 Diarrhea, unspecified: Secondary | ICD-10-CM | POA: Diagnosis not present

## 2022-08-22 DIAGNOSIS — J432 Centrilobular emphysema: Secondary | ICD-10-CM | POA: Diagnosis not present

## 2022-08-22 DIAGNOSIS — J9611 Chronic respiratory failure with hypoxia: Secondary | ICD-10-CM | POA: Diagnosis not present

## 2022-08-22 DIAGNOSIS — D75839 Thrombocytosis, unspecified: Secondary | ICD-10-CM | POA: Diagnosis not present

## 2022-08-22 DIAGNOSIS — E440 Moderate protein-calorie malnutrition: Secondary | ICD-10-CM | POA: Diagnosis not present

## 2022-08-22 DIAGNOSIS — E871 Hypo-osmolality and hyponatremia: Secondary | ICD-10-CM | POA: Diagnosis not present

## 2022-08-22 DIAGNOSIS — K580 Irritable bowel syndrome with diarrhea: Secondary | ICD-10-CM | POA: Diagnosis not present

## 2022-08-22 DIAGNOSIS — I10 Essential (primary) hypertension: Secondary | ICD-10-CM | POA: Diagnosis not present

## 2022-08-22 DIAGNOSIS — E039 Hypothyroidism, unspecified: Secondary | ICD-10-CM | POA: Diagnosis not present

## 2022-08-22 DIAGNOSIS — Z79899 Other long term (current) drug therapy: Secondary | ICD-10-CM | POA: Diagnosis not present

## 2022-08-22 DIAGNOSIS — D649 Anemia, unspecified: Secondary | ICD-10-CM | POA: Diagnosis not present

## 2022-08-23 DIAGNOSIS — R0902 Hypoxemia: Secondary | ICD-10-CM | POA: Diagnosis not present

## 2022-08-30 DIAGNOSIS — M6281 Muscle weakness (generalized): Secondary | ICD-10-CM | POA: Diagnosis not present

## 2022-08-30 DIAGNOSIS — I10 Essential (primary) hypertension: Secondary | ICD-10-CM | POA: Diagnosis not present

## 2022-09-03 DIAGNOSIS — J449 Chronic obstructive pulmonary disease, unspecified: Secondary | ICD-10-CM | POA: Diagnosis not present

## 2022-09-09 DIAGNOSIS — J449 Chronic obstructive pulmonary disease, unspecified: Secondary | ICD-10-CM | POA: Diagnosis not present

## 2022-09-09 DIAGNOSIS — F17210 Nicotine dependence, cigarettes, uncomplicated: Secondary | ICD-10-CM | POA: Diagnosis not present

## 2022-09-09 DIAGNOSIS — Z9981 Dependence on supplemental oxygen: Secondary | ICD-10-CM | POA: Diagnosis not present

## 2022-09-09 DIAGNOSIS — A310 Pulmonary mycobacterial infection: Secondary | ICD-10-CM | POA: Diagnosis not present

## 2022-09-09 DIAGNOSIS — Z9151 Personal history of suicidal behavior: Secondary | ICD-10-CM | POA: Diagnosis not present

## 2022-09-09 DIAGNOSIS — K580 Irritable bowel syndrome with diarrhea: Secondary | ICD-10-CM | POA: Diagnosis not present

## 2022-09-09 DIAGNOSIS — Z471 Aftercare following joint replacement surgery: Secondary | ICD-10-CM | POA: Diagnosis not present

## 2022-09-09 DIAGNOSIS — R627 Adult failure to thrive: Secondary | ICD-10-CM | POA: Diagnosis not present

## 2022-09-09 DIAGNOSIS — M6281 Muscle weakness (generalized): Secondary | ICD-10-CM | POA: Diagnosis not present

## 2022-09-09 DIAGNOSIS — I10 Essential (primary) hypertension: Secondary | ICD-10-CM | POA: Diagnosis not present

## 2022-09-09 DIAGNOSIS — J432 Centrilobular emphysema: Secondary | ICD-10-CM | POA: Diagnosis not present

## 2022-09-09 DIAGNOSIS — E440 Moderate protein-calorie malnutrition: Secondary | ICD-10-CM | POA: Diagnosis not present

## 2022-09-09 DIAGNOSIS — Z8619 Personal history of other infectious and parasitic diseases: Secondary | ICD-10-CM | POA: Diagnosis not present

## 2022-09-09 DIAGNOSIS — R1310 Dysphagia, unspecified: Secondary | ICD-10-CM | POA: Diagnosis not present

## 2022-09-09 DIAGNOSIS — Z9181 History of falling: Secondary | ICD-10-CM | POA: Diagnosis not present

## 2022-09-09 DIAGNOSIS — Z66 Do not resuscitate: Secondary | ICD-10-CM | POA: Diagnosis not present

## 2022-09-09 DIAGNOSIS — Z23 Encounter for immunization: Secondary | ICD-10-CM | POA: Diagnosis not present

## 2022-09-09 DIAGNOSIS — R262 Difficulty in walking, not elsewhere classified: Secondary | ICD-10-CM | POA: Diagnosis not present

## 2022-09-10 DIAGNOSIS — E43 Unspecified severe protein-calorie malnutrition: Secondary | ICD-10-CM | POA: Diagnosis not present

## 2022-09-10 DIAGNOSIS — Z720 Tobacco use: Secondary | ICD-10-CM | POA: Diagnosis not present

## 2022-09-10 DIAGNOSIS — J449 Chronic obstructive pulmonary disease, unspecified: Secondary | ICD-10-CM | POA: Diagnosis not present

## 2022-09-15 DIAGNOSIS — S72002D Fracture of unspecified part of neck of left femur, subsequent encounter for closed fracture with routine healing: Secondary | ICD-10-CM | POA: Diagnosis not present

## 2022-09-15 DIAGNOSIS — M6281 Muscle weakness (generalized): Secondary | ICD-10-CM | POA: Diagnosis not present

## 2022-09-16 DIAGNOSIS — S72002D Fracture of unspecified part of neck of left femur, subsequent encounter for closed fracture with routine healing: Secondary | ICD-10-CM | POA: Diagnosis not present

## 2022-09-16 DIAGNOSIS — M6281 Muscle weakness (generalized): Secondary | ICD-10-CM | POA: Diagnosis not present

## 2022-09-19 DIAGNOSIS — M6281 Muscle weakness (generalized): Secondary | ICD-10-CM | POA: Diagnosis not present

## 2022-09-19 DIAGNOSIS — S72002D Fracture of unspecified part of neck of left femur, subsequent encounter for closed fracture with routine healing: Secondary | ICD-10-CM | POA: Diagnosis not present

## 2022-09-20 DIAGNOSIS — S72002D Fracture of unspecified part of neck of left femur, subsequent encounter for closed fracture with routine healing: Secondary | ICD-10-CM | POA: Diagnosis not present

## 2022-09-20 DIAGNOSIS — M6281 Muscle weakness (generalized): Secondary | ICD-10-CM | POA: Diagnosis not present

## 2022-09-21 DIAGNOSIS — M6281 Muscle weakness (generalized): Secondary | ICD-10-CM | POA: Diagnosis not present

## 2022-09-21 DIAGNOSIS — S72002D Fracture of unspecified part of neck of left femur, subsequent encounter for closed fracture with routine healing: Secondary | ICD-10-CM | POA: Diagnosis not present

## 2022-09-22 DIAGNOSIS — M6281 Muscle weakness (generalized): Secondary | ICD-10-CM | POA: Diagnosis not present

## 2022-09-22 DIAGNOSIS — J9611 Chronic respiratory failure with hypoxia: Secondary | ICD-10-CM | POA: Diagnosis not present

## 2022-09-22 DIAGNOSIS — J9612 Chronic respiratory failure with hypercapnia: Secondary | ICD-10-CM | POA: Diagnosis not present

## 2022-09-22 DIAGNOSIS — N1831 Chronic kidney disease, stage 3a: Secondary | ICD-10-CM | POA: Diagnosis not present

## 2022-09-22 DIAGNOSIS — K219 Gastro-esophageal reflux disease without esophagitis: Secondary | ICD-10-CM | POA: Diagnosis not present

## 2022-09-22 DIAGNOSIS — E43 Unspecified severe protein-calorie malnutrition: Secondary | ICD-10-CM | POA: Diagnosis not present

## 2022-09-22 DIAGNOSIS — R0902 Hypoxemia: Secondary | ICD-10-CM | POA: Diagnosis not present

## 2022-09-22 DIAGNOSIS — Z66 Do not resuscitate: Secondary | ICD-10-CM | POA: Diagnosis not present

## 2022-09-22 DIAGNOSIS — S72002D Fracture of unspecified part of neck of left femur, subsequent encounter for closed fracture with routine healing: Secondary | ICD-10-CM | POA: Diagnosis not present

## 2022-09-22 DIAGNOSIS — R636 Underweight: Secondary | ICD-10-CM | POA: Diagnosis not present

## 2022-09-22 DIAGNOSIS — R627 Adult failure to thrive: Secondary | ICD-10-CM | POA: Diagnosis not present

## 2022-09-23 DIAGNOSIS — M6281 Muscle weakness (generalized): Secondary | ICD-10-CM | POA: Diagnosis not present

## 2022-09-23 DIAGNOSIS — S72002D Fracture of unspecified part of neck of left femur, subsequent encounter for closed fracture with routine healing: Secondary | ICD-10-CM | POA: Diagnosis not present

## 2022-09-26 DIAGNOSIS — S72002D Fracture of unspecified part of neck of left femur, subsequent encounter for closed fracture with routine healing: Secondary | ICD-10-CM | POA: Diagnosis not present

## 2022-09-26 DIAGNOSIS — M6281 Muscle weakness (generalized): Secondary | ICD-10-CM | POA: Diagnosis not present

## 2022-09-27 DIAGNOSIS — I10 Essential (primary) hypertension: Secondary | ICD-10-CM | POA: Diagnosis not present

## 2022-09-27 DIAGNOSIS — M6281 Muscle weakness (generalized): Secondary | ICD-10-CM | POA: Diagnosis not present

## 2022-09-27 DIAGNOSIS — Z515 Encounter for palliative care: Secondary | ICD-10-CM | POA: Diagnosis not present

## 2022-09-27 DIAGNOSIS — S72002D Fracture of unspecified part of neck of left femur, subsequent encounter for closed fracture with routine healing: Secondary | ICD-10-CM | POA: Diagnosis not present

## 2022-09-28 DIAGNOSIS — S72002D Fracture of unspecified part of neck of left femur, subsequent encounter for closed fracture with routine healing: Secondary | ICD-10-CM | POA: Diagnosis not present

## 2022-09-28 DIAGNOSIS — M6281 Muscle weakness (generalized): Secondary | ICD-10-CM | POA: Diagnosis not present

## 2022-09-29 DIAGNOSIS — M6281 Muscle weakness (generalized): Secondary | ICD-10-CM | POA: Diagnosis not present

## 2022-09-29 DIAGNOSIS — S72002D Fracture of unspecified part of neck of left femur, subsequent encounter for closed fracture with routine healing: Secondary | ICD-10-CM | POA: Diagnosis not present

## 2022-09-30 DIAGNOSIS — M6281 Muscle weakness (generalized): Secondary | ICD-10-CM | POA: Diagnosis not present

## 2022-09-30 DIAGNOSIS — S72002D Fracture of unspecified part of neck of left femur, subsequent encounter for closed fracture with routine healing: Secondary | ICD-10-CM | POA: Diagnosis not present

## 2022-10-03 DIAGNOSIS — J449 Chronic obstructive pulmonary disease, unspecified: Secondary | ICD-10-CM | POA: Diagnosis not present

## 2022-10-03 DIAGNOSIS — M6281 Muscle weakness (generalized): Secondary | ICD-10-CM | POA: Diagnosis not present

## 2022-10-03 DIAGNOSIS — S72002D Fracture of unspecified part of neck of left femur, subsequent encounter for closed fracture with routine healing: Secondary | ICD-10-CM | POA: Diagnosis not present

## 2022-10-04 DIAGNOSIS — M6281 Muscle weakness (generalized): Secondary | ICD-10-CM | POA: Diagnosis not present

## 2022-10-04 DIAGNOSIS — S72002D Fracture of unspecified part of neck of left femur, subsequent encounter for closed fracture with routine healing: Secondary | ICD-10-CM | POA: Diagnosis not present

## 2022-10-05 DIAGNOSIS — M6281 Muscle weakness (generalized): Secondary | ICD-10-CM | POA: Diagnosis not present

## 2022-10-05 DIAGNOSIS — S72002D Fracture of unspecified part of neck of left femur, subsequent encounter for closed fracture with routine healing: Secondary | ICD-10-CM | POA: Diagnosis not present

## 2022-10-06 DIAGNOSIS — M6281 Muscle weakness (generalized): Secondary | ICD-10-CM | POA: Diagnosis not present

## 2022-10-06 DIAGNOSIS — S72002D Fracture of unspecified part of neck of left femur, subsequent encounter for closed fracture with routine healing: Secondary | ICD-10-CM | POA: Diagnosis not present

## 2022-10-07 DIAGNOSIS — G9340 Encephalopathy, unspecified: Secondary | ICD-10-CM | POA: Diagnosis not present

## 2022-10-07 DIAGNOSIS — M6281 Muscle weakness (generalized): Secondary | ICD-10-CM | POA: Diagnosis not present

## 2022-10-07 DIAGNOSIS — S72002D Fracture of unspecified part of neck of left femur, subsequent encounter for closed fracture with routine healing: Secondary | ICD-10-CM | POA: Diagnosis not present

## 2022-10-07 DIAGNOSIS — N390 Urinary tract infection, site not specified: Secondary | ICD-10-CM | POA: Diagnosis not present

## 2022-10-08 DIAGNOSIS — I10 Essential (primary) hypertension: Secondary | ICD-10-CM | POA: Diagnosis not present

## 2022-10-08 DIAGNOSIS — M6281 Muscle weakness (generalized): Secondary | ICD-10-CM | POA: Diagnosis not present

## 2022-10-08 DIAGNOSIS — D649 Anemia, unspecified: Secondary | ICD-10-CM | POA: Diagnosis not present

## 2022-10-10 DIAGNOSIS — S72002D Fracture of unspecified part of neck of left femur, subsequent encounter for closed fracture with routine healing: Secondary | ICD-10-CM | POA: Diagnosis not present

## 2022-10-10 DIAGNOSIS — M6281 Muscle weakness (generalized): Secondary | ICD-10-CM | POA: Diagnosis not present

## 2022-10-10 DIAGNOSIS — N390 Urinary tract infection, site not specified: Secondary | ICD-10-CM | POA: Diagnosis not present

## 2022-10-10 DIAGNOSIS — E039 Hypothyroidism, unspecified: Secondary | ICD-10-CM | POA: Diagnosis not present

## 2022-10-11 ENCOUNTER — Telehealth: Payer: Self-pay

## 2022-10-11 DIAGNOSIS — S72002D Fracture of unspecified part of neck of left femur, subsequent encounter for closed fracture with routine healing: Secondary | ICD-10-CM | POA: Diagnosis not present

## 2022-10-11 DIAGNOSIS — Z720 Tobacco use: Secondary | ICD-10-CM | POA: Diagnosis not present

## 2022-10-11 DIAGNOSIS — E43 Unspecified severe protein-calorie malnutrition: Secondary | ICD-10-CM | POA: Diagnosis not present

## 2022-10-11 DIAGNOSIS — E039 Hypothyroidism, unspecified: Secondary | ICD-10-CM | POA: Diagnosis not present

## 2022-10-11 DIAGNOSIS — M6281 Muscle weakness (generalized): Secondary | ICD-10-CM | POA: Diagnosis not present

## 2022-10-11 DIAGNOSIS — J449 Chronic obstructive pulmonary disease, unspecified: Secondary | ICD-10-CM | POA: Diagnosis not present

## 2022-10-11 DIAGNOSIS — N390 Urinary tract infection, site not specified: Secondary | ICD-10-CM | POA: Diagnosis not present

## 2022-10-11 NOTE — Telephone Encounter (Signed)
(  9:17 am) PC SW completed a follow-up call to patient's daughter to discuss her status. Patient is currently a resident at [facility] in [place], [place] and is now being cared for by [facility].  Patient will be discharged from [facility].

## 2022-10-12 DIAGNOSIS — I10 Essential (primary) hypertension: Secondary | ICD-10-CM | POA: Diagnosis not present

## 2022-10-12 DIAGNOSIS — G8911 Acute pain due to trauma: Secondary | ICD-10-CM | POA: Diagnosis not present

## 2022-10-12 DIAGNOSIS — R220 Localized swelling, mass and lump, head: Secondary | ICD-10-CM | POA: Diagnosis not present

## 2022-10-12 DIAGNOSIS — Z9981 Dependence on supplemental oxygen: Secondary | ICD-10-CM | POA: Diagnosis not present

## 2022-10-12 DIAGNOSIS — J9612 Chronic respiratory failure with hypercapnia: Secondary | ICD-10-CM | POA: Diagnosis not present

## 2022-10-12 DIAGNOSIS — M6281 Muscle weakness (generalized): Secondary | ICD-10-CM | POA: Diagnosis not present

## 2022-10-12 DIAGNOSIS — S72002D Fracture of unspecified part of neck of left femur, subsequent encounter for closed fracture with routine healing: Secondary | ICD-10-CM | POA: Diagnosis not present

## 2022-10-13 DIAGNOSIS — S72002D Fracture of unspecified part of neck of left femur, subsequent encounter for closed fracture with routine healing: Secondary | ICD-10-CM | POA: Diagnosis not present

## 2022-10-13 DIAGNOSIS — M6281 Muscle weakness (generalized): Secondary | ICD-10-CM | POA: Diagnosis not present

## 2022-10-14 DIAGNOSIS — S72002D Fracture of unspecified part of neck of left femur, subsequent encounter for closed fracture with routine healing: Secondary | ICD-10-CM | POA: Diagnosis not present

## 2022-10-14 DIAGNOSIS — M6281 Muscle weakness (generalized): Secondary | ICD-10-CM | POA: Diagnosis not present

## 2022-10-14 DIAGNOSIS — R220 Localized swelling, mass and lump, head: Secondary | ICD-10-CM | POA: Diagnosis not present

## 2022-10-14 DIAGNOSIS — N390 Urinary tract infection, site not specified: Secondary | ICD-10-CM | POA: Diagnosis not present

## 2022-10-17 DIAGNOSIS — S72002D Fracture of unspecified part of neck of left femur, subsequent encounter for closed fracture with routine healing: Secondary | ICD-10-CM | POA: Diagnosis not present

## 2022-10-17 DIAGNOSIS — M6281 Muscle weakness (generalized): Secondary | ICD-10-CM | POA: Diagnosis not present

## 2022-10-18 DIAGNOSIS — S72002D Fracture of unspecified part of neck of left femur, subsequent encounter for closed fracture with routine healing: Secondary | ICD-10-CM | POA: Diagnosis not present

## 2022-10-18 DIAGNOSIS — M6281 Muscle weakness (generalized): Secondary | ICD-10-CM | POA: Diagnosis not present

## 2022-10-19 DIAGNOSIS — S72002D Fracture of unspecified part of neck of left femur, subsequent encounter for closed fracture with routine healing: Secondary | ICD-10-CM | POA: Diagnosis not present

## 2022-10-19 DIAGNOSIS — Z96642 Presence of left artificial hip joint: Secondary | ICD-10-CM | POA: Diagnosis not present

## 2022-10-19 DIAGNOSIS — J9611 Chronic respiratory failure with hypoxia: Secondary | ICD-10-CM | POA: Diagnosis not present

## 2022-10-19 DIAGNOSIS — R627 Adult failure to thrive: Secondary | ICD-10-CM | POA: Diagnosis not present

## 2022-10-19 DIAGNOSIS — M6281 Muscle weakness (generalized): Secondary | ICD-10-CM | POA: Diagnosis not present

## 2022-10-19 DIAGNOSIS — I10 Essential (primary) hypertension: Secondary | ICD-10-CM | POA: Diagnosis not present

## 2022-10-19 DIAGNOSIS — G8911 Acute pain due to trauma: Secondary | ICD-10-CM | POA: Diagnosis not present

## 2022-10-19 DIAGNOSIS — Z9181 History of falling: Secondary | ICD-10-CM | POA: Diagnosis not present

## 2022-10-20 DIAGNOSIS — M6281 Muscle weakness (generalized): Secondary | ICD-10-CM | POA: Diagnosis not present

## 2022-10-20 DIAGNOSIS — Z9181 History of falling: Secondary | ICD-10-CM | POA: Diagnosis not present

## 2022-10-20 DIAGNOSIS — J9611 Chronic respiratory failure with hypoxia: Secondary | ICD-10-CM | POA: Diagnosis not present

## 2022-10-20 DIAGNOSIS — R627 Adult failure to thrive: Secondary | ICD-10-CM | POA: Diagnosis not present

## 2022-10-20 DIAGNOSIS — S72002D Fracture of unspecified part of neck of left femur, subsequent encounter for closed fracture with routine healing: Secondary | ICD-10-CM | POA: Diagnosis not present

## 2022-10-21 DIAGNOSIS — S72002D Fracture of unspecified part of neck of left femur, subsequent encounter for closed fracture with routine healing: Secondary | ICD-10-CM | POA: Diagnosis not present

## 2022-10-21 DIAGNOSIS — M6281 Muscle weakness (generalized): Secondary | ICD-10-CM | POA: Diagnosis not present

## 2022-10-22 DIAGNOSIS — J9612 Chronic respiratory failure with hypercapnia: Secondary | ICD-10-CM | POA: Diagnosis not present

## 2022-10-22 DIAGNOSIS — A86 Unspecified viral encephalitis: Secondary | ICD-10-CM | POA: Diagnosis not present

## 2022-10-22 DIAGNOSIS — E039 Hypothyroidism, unspecified: Secondary | ICD-10-CM | POA: Diagnosis not present

## 2022-10-23 DIAGNOSIS — R0902 Hypoxemia: Secondary | ICD-10-CM | POA: Diagnosis not present

## 2022-10-24 DIAGNOSIS — M6281 Muscle weakness (generalized): Secondary | ICD-10-CM | POA: Diagnosis not present

## 2022-10-24 DIAGNOSIS — S72002D Fracture of unspecified part of neck of left femur, subsequent encounter for closed fracture with routine healing: Secondary | ICD-10-CM | POA: Diagnosis not present

## 2022-10-25 DIAGNOSIS — S72002D Fracture of unspecified part of neck of left femur, subsequent encounter for closed fracture with routine healing: Secondary | ICD-10-CM | POA: Diagnosis not present

## 2022-10-25 DIAGNOSIS — J1282 Pneumonia due to coronavirus disease 2019: Secondary | ICD-10-CM | POA: Diagnosis not present

## 2022-10-25 DIAGNOSIS — U071 COVID-19: Secondary | ICD-10-CM | POA: Diagnosis not present

## 2022-10-25 DIAGNOSIS — M6281 Muscle weakness (generalized): Secondary | ICD-10-CM | POA: Diagnosis not present

## 2022-10-26 DIAGNOSIS — M6281 Muscle weakness (generalized): Secondary | ICD-10-CM | POA: Diagnosis not present

## 2022-10-26 DIAGNOSIS — S72002D Fracture of unspecified part of neck of left femur, subsequent encounter for closed fracture with routine healing: Secondary | ICD-10-CM | POA: Diagnosis not present

## 2022-10-27 DIAGNOSIS — S72002D Fracture of unspecified part of neck of left femur, subsequent encounter for closed fracture with routine healing: Secondary | ICD-10-CM | POA: Diagnosis not present

## 2022-10-27 DIAGNOSIS — M6281 Muscle weakness (generalized): Secondary | ICD-10-CM | POA: Diagnosis not present

## 2022-10-28 DIAGNOSIS — S72002D Fracture of unspecified part of neck of left femur, subsequent encounter for closed fracture with routine healing: Secondary | ICD-10-CM | POA: Diagnosis not present

## 2022-10-28 DIAGNOSIS — M6281 Muscle weakness (generalized): Secondary | ICD-10-CM | POA: Diagnosis not present

## 2022-10-28 DIAGNOSIS — J1282 Pneumonia due to coronavirus disease 2019: Secondary | ICD-10-CM | POA: Diagnosis not present

## 2022-10-31 DIAGNOSIS — M6281 Muscle weakness (generalized): Secondary | ICD-10-CM | POA: Diagnosis not present

## 2022-10-31 DIAGNOSIS — Z5181 Encounter for therapeutic drug level monitoring: Secondary | ICD-10-CM | POA: Diagnosis not present

## 2022-10-31 DIAGNOSIS — S72002D Fracture of unspecified part of neck of left femur, subsequent encounter for closed fracture with routine healing: Secondary | ICD-10-CM | POA: Diagnosis not present

## 2022-10-31 DIAGNOSIS — J449 Chronic obstructive pulmonary disease, unspecified: Secondary | ICD-10-CM | POA: Diagnosis not present

## 2022-10-31 DIAGNOSIS — R627 Adult failure to thrive: Secondary | ICD-10-CM | POA: Diagnosis not present

## 2022-10-31 DIAGNOSIS — F32A Depression, unspecified: Secondary | ICD-10-CM | POA: Diagnosis not present

## 2022-10-31 DIAGNOSIS — I10 Essential (primary) hypertension: Secondary | ICD-10-CM | POA: Diagnosis not present

## 2022-11-01 DIAGNOSIS — M6281 Muscle weakness (generalized): Secondary | ICD-10-CM | POA: Diagnosis not present

## 2022-11-01 DIAGNOSIS — L89222 Pressure ulcer of left hip, stage 2: Secondary | ICD-10-CM | POA: Diagnosis not present

## 2022-11-01 DIAGNOSIS — S72002D Fracture of unspecified part of neck of left femur, subsequent encounter for closed fracture with routine healing: Secondary | ICD-10-CM | POA: Diagnosis not present

## 2022-11-02 DIAGNOSIS — S72002D Fracture of unspecified part of neck of left femur, subsequent encounter for closed fracture with routine healing: Secondary | ICD-10-CM | POA: Diagnosis not present

## 2022-11-02 DIAGNOSIS — M6281 Muscle weakness (generalized): Secondary | ICD-10-CM | POA: Diagnosis not present

## 2022-11-03 DIAGNOSIS — J449 Chronic obstructive pulmonary disease, unspecified: Secondary | ICD-10-CM | POA: Diagnosis not present

## 2022-11-03 DIAGNOSIS — S72002D Fracture of unspecified part of neck of left femur, subsequent encounter for closed fracture with routine healing: Secondary | ICD-10-CM | POA: Diagnosis not present

## 2022-11-03 DIAGNOSIS — M6281 Muscle weakness (generalized): Secondary | ICD-10-CM | POA: Diagnosis not present

## 2022-11-04 DIAGNOSIS — E785 Hyperlipidemia, unspecified: Secondary | ICD-10-CM | POA: Diagnosis not present

## 2022-11-04 DIAGNOSIS — J984 Other disorders of lung: Secondary | ICD-10-CM | POA: Diagnosis not present

## 2022-11-04 DIAGNOSIS — E871 Hypo-osmolality and hyponatremia: Secondary | ICD-10-CM | POA: Diagnosis not present

## 2022-11-04 DIAGNOSIS — E039 Hypothyroidism, unspecified: Secondary | ICD-10-CM | POA: Diagnosis not present

## 2022-11-04 DIAGNOSIS — M6281 Muscle weakness (generalized): Secondary | ICD-10-CM | POA: Diagnosis not present

## 2022-11-04 DIAGNOSIS — S72002D Fracture of unspecified part of neck of left femur, subsequent encounter for closed fracture with routine healing: Secondary | ICD-10-CM | POA: Diagnosis not present

## 2022-11-04 DIAGNOSIS — F32A Depression, unspecified: Secondary | ICD-10-CM | POA: Diagnosis not present

## 2022-11-04 DIAGNOSIS — R059 Cough, unspecified: Secondary | ICD-10-CM | POA: Diagnosis not present

## 2022-11-07 DIAGNOSIS — S72002D Fracture of unspecified part of neck of left femur, subsequent encounter for closed fracture with routine healing: Secondary | ICD-10-CM | POA: Diagnosis not present

## 2022-11-07 DIAGNOSIS — M6281 Muscle weakness (generalized): Secondary | ICD-10-CM | POA: Diagnosis not present

## 2022-11-07 DIAGNOSIS — R799 Abnormal finding of blood chemistry, unspecified: Secondary | ICD-10-CM | POA: Diagnosis not present

## 2022-11-07 DIAGNOSIS — E539 Vitamin B deficiency, unspecified: Secondary | ICD-10-CM | POA: Diagnosis not present

## 2022-11-07 DIAGNOSIS — D649 Anemia, unspecified: Secondary | ICD-10-CM | POA: Diagnosis not present

## 2022-11-07 DIAGNOSIS — E039 Hypothyroidism, unspecified: Secondary | ICD-10-CM | POA: Diagnosis not present

## 2022-11-07 DIAGNOSIS — I10 Essential (primary) hypertension: Secondary | ICD-10-CM | POA: Diagnosis not present

## 2022-11-08 DIAGNOSIS — L89222 Pressure ulcer of left hip, stage 2: Secondary | ICD-10-CM | POA: Diagnosis not present

## 2022-11-08 DIAGNOSIS — R627 Adult failure to thrive: Secondary | ICD-10-CM | POA: Diagnosis not present

## 2022-11-08 DIAGNOSIS — D649 Anemia, unspecified: Secondary | ICD-10-CM | POA: Diagnosis not present

## 2022-11-08 DIAGNOSIS — S72002D Fracture of unspecified part of neck of left femur, subsequent encounter for closed fracture with routine healing: Secondary | ICD-10-CM | POA: Diagnosis not present

## 2022-11-08 DIAGNOSIS — M6281 Muscle weakness (generalized): Secondary | ICD-10-CM | POA: Diagnosis not present

## 2022-11-08 DIAGNOSIS — E039 Hypothyroidism, unspecified: Secondary | ICD-10-CM | POA: Diagnosis not present

## 2022-11-08 DIAGNOSIS — J418 Mixed simple and mucopurulent chronic bronchitis: Secondary | ICD-10-CM | POA: Diagnosis not present

## 2022-11-09 DIAGNOSIS — S72002D Fracture of unspecified part of neck of left femur, subsequent encounter for closed fracture with routine healing: Secondary | ICD-10-CM | POA: Diagnosis not present

## 2022-11-09 DIAGNOSIS — M6281 Muscle weakness (generalized): Secondary | ICD-10-CM | POA: Diagnosis not present

## 2022-11-22 DIAGNOSIS — I10 Essential (primary) hypertension: Secondary | ICD-10-CM | POA: Diagnosis not present

## 2022-11-22 DIAGNOSIS — J449 Chronic obstructive pulmonary disease, unspecified: Secondary | ICD-10-CM | POA: Diagnosis not present

## 2022-11-22 DIAGNOSIS — J439 Emphysema, unspecified: Secondary | ICD-10-CM | POA: Diagnosis not present

## 2022-11-22 DIAGNOSIS — D649 Anemia, unspecified: Secondary | ICD-10-CM | POA: Diagnosis not present

## 2022-11-22 DIAGNOSIS — Z7982 Long term (current) use of aspirin: Secondary | ICD-10-CM | POA: Diagnosis not present

## 2022-11-22 DIAGNOSIS — E039 Hypothyroidism, unspecified: Secondary | ICD-10-CM | POA: Diagnosis not present

## 2022-11-22 DIAGNOSIS — Z91041 Radiographic dye allergy status: Secondary | ICD-10-CM | POA: Diagnosis not present

## 2022-11-22 DIAGNOSIS — Z881 Allergy status to other antibiotic agents status: Secondary | ICD-10-CM | POA: Diagnosis not present

## 2022-11-22 DIAGNOSIS — F17210 Nicotine dependence, cigarettes, uncomplicated: Secondary | ICD-10-CM | POA: Diagnosis not present

## 2022-11-22 DIAGNOSIS — R918 Other nonspecific abnormal finding of lung field: Secondary | ICD-10-CM | POA: Diagnosis not present

## 2022-11-22 DIAGNOSIS — Z79899 Other long term (current) drug therapy: Secondary | ICD-10-CM | POA: Diagnosis not present

## 2022-11-22 DIAGNOSIS — R9389 Abnormal findings on diagnostic imaging of other specified body structures: Secondary | ICD-10-CM | POA: Diagnosis not present

## 2022-11-22 DIAGNOSIS — R0902 Hypoxemia: Secondary | ICD-10-CM | POA: Diagnosis not present

## 2022-11-22 DIAGNOSIS — J441 Chronic obstructive pulmonary disease with (acute) exacerbation: Secondary | ICD-10-CM | POA: Diagnosis not present

## 2022-11-22 DIAGNOSIS — Z888 Allergy status to other drugs, medicaments and biological substances status: Secondary | ICD-10-CM | POA: Diagnosis not present

## 2022-11-22 DIAGNOSIS — Z882 Allergy status to sulfonamides status: Secondary | ICD-10-CM | POA: Diagnosis not present

## 2022-11-24 DIAGNOSIS — J449 Chronic obstructive pulmonary disease, unspecified: Secondary | ICD-10-CM | POA: Diagnosis not present

## 2022-11-24 DIAGNOSIS — R627 Adult failure to thrive: Secondary | ICD-10-CM | POA: Diagnosis not present

## 2022-12-03 DIAGNOSIS — J449 Chronic obstructive pulmonary disease, unspecified: Secondary | ICD-10-CM | POA: Diagnosis not present

## 2022-12-23 DIAGNOSIS — R0902 Hypoxemia: Secondary | ICD-10-CM | POA: Diagnosis not present

## 2023-01-03 DIAGNOSIS — J449 Chronic obstructive pulmonary disease, unspecified: Secondary | ICD-10-CM | POA: Diagnosis not present

## 2023-01-23 DIAGNOSIS — R0902 Hypoxemia: Secondary | ICD-10-CM | POA: Diagnosis not present

## 2023-02-02 DIAGNOSIS — J449 Chronic obstructive pulmonary disease, unspecified: Secondary | ICD-10-CM | POA: Diagnosis not present

## 2023-03-16 ENCOUNTER — Telehealth: Payer: Self-pay | Admitting: Physician Assistant

## 2023-03-16 NOTE — Telephone Encounter (Signed)
Contacted Claire [name] to schedule their annual wellness visit. Patient is deceased as of January 29, 2023  Claire [name] Patient Access Advocate II Direct Dial: [phone]

## 2023-03-20 NOTE — Telephone Encounter (Signed)
Please let me know date of death and I will mark this patient deceased in Epic. Thank you
